# Patient Record
Sex: Male | Born: 1945 | Race: White | Hispanic: No | Smoking: Never smoker
Health system: Southern US, Community
[De-identification: ages and names within clinical notes are randomized; demographics above are authoritative.]

## PROBLEM LIST (undated history)

## (undated) ENCOUNTER — Emergency Department (HOSPITAL_COMMUNITY): Discharge status: Absent without leave | Payer: Self-pay

## (undated) DIAGNOSIS — N200 Calculus of kidney: Secondary | ICD-10-CM

## (undated) DIAGNOSIS — I469 Cardiac arrest, cause unspecified: Secondary | ICD-10-CM

## (undated) DIAGNOSIS — E1149 Type 2 diabetes mellitus with other diabetic neurological complication: Secondary | ICD-10-CM

## (undated) DIAGNOSIS — Z7901 Long term (current) use of anticoagulants: Secondary | ICD-10-CM

## (undated) DIAGNOSIS — I4819 Other persistent atrial fibrillation: Secondary | ICD-10-CM

## (undated) DIAGNOSIS — E119 Type 2 diabetes mellitus without complications: Secondary | ICD-10-CM

## (undated) DIAGNOSIS — Z95810 Presence of automatic (implantable) cardiac defibrillator: Secondary | ICD-10-CM

## (undated) DIAGNOSIS — I219 Acute myocardial infarction, unspecified: Secondary | ICD-10-CM

## (undated) DIAGNOSIS — E785 Hyperlipidemia, unspecified: Secondary | ICD-10-CM

## (undated) DIAGNOSIS — I255 Ischemic cardiomyopathy: Secondary | ICD-10-CM

## (undated) DIAGNOSIS — L84 Corns and callosities: Secondary | ICD-10-CM

## (undated) DIAGNOSIS — D485 Neoplasm of uncertain behavior of skin: Secondary | ICD-10-CM

## (undated) DIAGNOSIS — E291 Testicular hypofunction: Secondary | ICD-10-CM

## (undated) DIAGNOSIS — Z87442 Personal history of urinary calculi: Secondary | ICD-10-CM

## (undated) DIAGNOSIS — I5022 Chronic systolic (congestive) heart failure: Secondary | ICD-10-CM

## (undated) HISTORY — PX: CARDIAC DEFIBRILLATOR PLACEMENT: SHX171

## (undated) HISTORY — DX: Type 2 diabetes mellitus with other diabetic neurological complication: E11.49

## (undated) HISTORY — PX: PTCA: SHX146

## (undated) HISTORY — PX: CARDIAC CATHETERIZATION: SHX172

## (undated) HISTORY — DX: Neoplasm of uncertain behavior of skin: D48.5

## (undated) HISTORY — PX: TONSILLECTOMY: SUR1361

## (undated) HISTORY — DX: Testicular hypofunction: E29.1

## (undated) HISTORY — DX: Other persistent atrial fibrillation: I48.19

## (undated) HISTORY — PX: EYE SURGERY: SHX253

## (undated) HISTORY — DX: Chronic systolic (congestive) heart failure: I50.22

## (undated) HISTORY — DX: Corns and callosities: L84

## (undated) HISTORY — PX: CATARACT EXTRACTION: SUR2

## (undated) HISTORY — DX: Personal history of urinary calculi: Z87.442

## (undated) HISTORY — DX: Ischemic cardiomyopathy: I25.5

## (undated) HISTORY — DX: Presence of automatic (implantable) cardiac defibrillator: Z95.810

## (undated) HISTORY — DX: Type 2 diabetes mellitus without complications: E11.9

## (undated) HISTORY — DX: Long term (current) use of anticoagulants: Z79.01

## (undated) HISTORY — PX: TONSILLECTOMY: SHX5217

## (undated) HISTORY — DX: Hyperlipidemia, unspecified: E78.5

---

## 2000-11-21 LAB — HM COLONOSCOPY: HM Colonoscopy: NORMAL

## 2004-01-13 ENCOUNTER — Ambulatory Visit: Payer: Self-pay | Admitting: Internal Medicine

## 2004-02-29 DIAGNOSIS — I219 Acute myocardial infarction, unspecified: Secondary | ICD-10-CM

## 2004-02-29 HISTORY — DX: Acute myocardial infarction, unspecified: I21.9

## 2004-03-25 ENCOUNTER — Ambulatory Visit: Payer: Self-pay | Admitting: Internal Medicine

## 2004-10-08 ENCOUNTER — Ambulatory Visit: Payer: Self-pay | Admitting: Internal Medicine

## 2004-11-03 DIAGNOSIS — I2510 Atherosclerotic heart disease of native coronary artery without angina pectoris: Secondary | ICD-10-CM | POA: Insufficient documentation

## 2005-02-22 ENCOUNTER — Ambulatory Visit: Payer: Self-pay | Admitting: Internal Medicine

## 2005-03-01 ENCOUNTER — Ambulatory Visit: Payer: Self-pay | Admitting: *Deleted

## 2005-03-08 ENCOUNTER — Ambulatory Visit: Payer: Self-pay | Admitting: *Deleted

## 2005-03-15 ENCOUNTER — Ambulatory Visit: Payer: Self-pay | Admitting: Cardiology

## 2005-03-15 ENCOUNTER — Ambulatory Visit: Payer: Self-pay | Admitting: Internal Medicine

## 2005-03-22 ENCOUNTER — Ambulatory Visit: Payer: Self-pay | Admitting: Cardiology

## 2005-03-29 ENCOUNTER — Ambulatory Visit: Payer: Self-pay | Admitting: Cardiology

## 2005-04-12 ENCOUNTER — Ambulatory Visit: Payer: Self-pay | Admitting: Cardiology

## 2005-05-03 ENCOUNTER — Ambulatory Visit: Payer: Self-pay | Admitting: Cardiology

## 2005-05-31 ENCOUNTER — Ambulatory Visit: Payer: Self-pay | Admitting: Cardiology

## 2005-06-28 ENCOUNTER — Ambulatory Visit: Payer: Self-pay | Admitting: Cardiology

## 2005-07-26 ENCOUNTER — Ambulatory Visit: Payer: Self-pay | Admitting: Internal Medicine

## 2005-08-23 ENCOUNTER — Ambulatory Visit: Payer: Self-pay | Admitting: Cardiology

## 2005-09-20 ENCOUNTER — Ambulatory Visit: Payer: Self-pay | Admitting: Cardiology

## 2005-10-18 ENCOUNTER — Ambulatory Visit: Payer: Self-pay | Admitting: Internal Medicine

## 2005-11-15 ENCOUNTER — Ambulatory Visit: Payer: Self-pay | Admitting: Cardiology

## 2005-11-29 ENCOUNTER — Ambulatory Visit: Payer: Self-pay | Admitting: *Deleted

## 2005-12-13 ENCOUNTER — Ambulatory Visit: Payer: Self-pay | Admitting: Cardiovascular Disease

## 2005-12-21 ENCOUNTER — Ambulatory Visit: Payer: Self-pay | Admitting: Internal Medicine

## 2005-12-27 ENCOUNTER — Encounter: Payer: Self-pay | Admitting: Cardiology

## 2005-12-27 ENCOUNTER — Ambulatory Visit: Payer: Self-pay

## 2005-12-27 ENCOUNTER — Ambulatory Visit: Payer: Self-pay | Admitting: Cardiology

## 2006-01-04 ENCOUNTER — Ambulatory Visit: Payer: Self-pay | Admitting: Internal Medicine

## 2006-01-04 ENCOUNTER — Ambulatory Visit: Payer: Self-pay | Admitting: Cardiology

## 2006-03-15 ENCOUNTER — Ambulatory Visit: Payer: Self-pay | Admitting: Cardiology

## 2006-05-19 ENCOUNTER — Ambulatory Visit: Payer: Self-pay | Admitting: Internal Medicine

## 2006-06-29 ENCOUNTER — Ambulatory Visit: Payer: Self-pay | Admitting: Cardiovascular Disease

## 2006-06-29 ENCOUNTER — Ambulatory Visit: Payer: Self-pay

## 2006-06-29 ENCOUNTER — Ambulatory Visit: Payer: Self-pay | Admitting: Internal Medicine

## 2006-07-11 ENCOUNTER — Ambulatory Visit: Payer: Self-pay | Admitting: Cardiology

## 2006-08-01 ENCOUNTER — Ambulatory Visit: Payer: Self-pay | Admitting: Cardiology

## 2006-08-01 ENCOUNTER — Ambulatory Visit: Payer: Self-pay | Admitting: Cardiovascular Disease

## 2006-08-01 LAB — CONVERTED CEMR LAB
Albumin: 3.8 g/dL (ref 3.5–5.2)
Alkaline Phosphatase: 74 units/L (ref 39–117)
BUN: 12 mg/dL (ref 6–23)
CO2: 30 meq/L (ref 19–32)
GFR calc Af Amer: 88 mL/min
GFR calc non Af Amer: 73 mL/min
Glucose, Bld: 159 mg/dL — ABNORMAL HIGH (ref 70–99)
Sodium: 137 meq/L (ref 135–145)
Total CHOL/HDL Ratio: 4.3
Total Protein: 7.2 g/dL (ref 6.0–8.3)
Triglycerides: 339 mg/dL (ref 0–149)

## 2006-08-15 ENCOUNTER — Ambulatory Visit: Payer: Self-pay | Admitting: Internal Medicine

## 2006-09-11 ENCOUNTER — Ambulatory Visit: Payer: Self-pay | Admitting: Internal Medicine

## 2006-09-12 ENCOUNTER — Ambulatory Visit: Payer: Self-pay | Admitting: Cardiovascular Disease

## 2006-10-03 ENCOUNTER — Ambulatory Visit: Payer: Self-pay | Admitting: Cardiology

## 2006-11-13 ENCOUNTER — Ambulatory Visit: Payer: Self-pay | Admitting: Internal Medicine

## 2006-11-14 ENCOUNTER — Ambulatory Visit: Payer: Self-pay | Admitting: Internal Medicine

## 2006-11-24 ENCOUNTER — Encounter: Payer: Self-pay | Admitting: Internal Medicine

## 2006-11-24 ENCOUNTER — Ambulatory Visit: Payer: Self-pay | Admitting: Internal Medicine

## 2006-11-25 DIAGNOSIS — I4891 Unspecified atrial fibrillation: Secondary | ICD-10-CM | POA: Insufficient documentation

## 2006-11-25 DIAGNOSIS — E785 Hyperlipidemia, unspecified: Secondary | ICD-10-CM

## 2006-11-25 DIAGNOSIS — I4819 Other persistent atrial fibrillation: Secondary | ICD-10-CM

## 2006-11-25 DIAGNOSIS — I480 Paroxysmal atrial fibrillation: Secondary | ICD-10-CM | POA: Insufficient documentation

## 2006-11-25 DIAGNOSIS — I2589 Other forms of chronic ischemic heart disease: Secondary | ICD-10-CM | POA: Insufficient documentation

## 2006-11-25 HISTORY — DX: Hyperlipidemia, unspecified: E78.5

## 2006-11-25 HISTORY — DX: Other persistent atrial fibrillation: I48.19

## 2006-11-28 ENCOUNTER — Ambulatory Visit: Payer: Self-pay | Admitting: Cardiology

## 2006-11-30 ENCOUNTER — Ambulatory Visit: Payer: Self-pay | Admitting: Internal Medicine

## 2006-11-30 LAB — CONVERTED CEMR LAB
AST: 24 units/L (ref 0–37)
Albumin: 3.8 g/dL (ref 3.5–5.2)
Alkaline Phosphatase: 77 units/L (ref 39–117)
BUN: 16 mg/dL (ref 6–23)
CO2: 31 meq/L (ref 19–32)
Calcium: 9.4 mg/dL (ref 8.4–10.5)
Chloride: 102 meq/L (ref 96–112)
Creatinine, Ser: 1.2 mg/dL (ref 0.4–1.5)
HCT: 40.8 % (ref 39.0–52.0)
Hgb A1c MFr Bld: 8.4 % — ABNORMAL HIGH (ref 4.6–6.0)
Lymphocytes Relative: 22 % (ref 12.0–46.0)
MCHC: 35 g/dL (ref 30.0–36.0)
MCV: 88.9 fL (ref 78.0–100.0)
Potassium: 4.5 meq/L (ref 3.5–5.1)
TSH: 0.86 microintl units/mL (ref 0.35–5.50)
Testosterone: 155.93 ng/dL — ABNORMAL LOW (ref 350.00–890)
Vitamin B-12: 579 pg/mL (ref 211–911)
WBC: 7.4 10*3/uL (ref 4.5–10.5)

## 2006-12-05 ENCOUNTER — Encounter: Payer: Self-pay | Admitting: Internal Medicine

## 2006-12-05 ENCOUNTER — Ambulatory Visit: Payer: Self-pay | Admitting: Internal Medicine

## 2006-12-05 DIAGNOSIS — E291 Testicular hypofunction: Secondary | ICD-10-CM | POA: Insufficient documentation

## 2006-12-05 DIAGNOSIS — E1165 Type 2 diabetes mellitus with hyperglycemia: Secondary | ICD-10-CM | POA: Insufficient documentation

## 2006-12-05 DIAGNOSIS — E119 Type 2 diabetes mellitus without complications: Secondary | ICD-10-CM

## 2006-12-05 HISTORY — DX: Testicular hypofunction: E29.1

## 2006-12-05 HISTORY — DX: Type 2 diabetes mellitus without complications: E11.9

## 2006-12-07 ENCOUNTER — Ambulatory Visit: Payer: Self-pay | Admitting: Internal Medicine

## 2006-12-12 ENCOUNTER — Ambulatory Visit: Payer: Self-pay | Admitting: Internal Medicine

## 2006-12-26 ENCOUNTER — Ambulatory Visit: Payer: Self-pay | Admitting: Cardiology

## 2007-01-08 ENCOUNTER — Ambulatory Visit: Payer: Self-pay | Admitting: Cardiovascular Disease

## 2007-01-10 ENCOUNTER — Ambulatory Visit: Payer: Self-pay

## 2007-01-23 ENCOUNTER — Ambulatory Visit: Payer: Self-pay | Admitting: Cardiovascular Disease

## 2007-01-28 ENCOUNTER — Encounter: Payer: Self-pay | Admitting: Internal Medicine

## 2007-02-20 ENCOUNTER — Ambulatory Visit: Payer: Self-pay | Admitting: Cardiology

## 2007-03-08 ENCOUNTER — Ambulatory Visit: Payer: Self-pay | Admitting: Internal Medicine

## 2007-03-22 ENCOUNTER — Ambulatory Visit: Payer: Self-pay | Admitting: Cardiology

## 2007-04-16 ENCOUNTER — Telehealth: Payer: Self-pay | Admitting: Internal Medicine

## 2007-04-19 ENCOUNTER — Ambulatory Visit: Payer: Self-pay | Admitting: Cardiology

## 2007-05-15 ENCOUNTER — Ambulatory Visit: Payer: Self-pay | Admitting: Cardiology

## 2007-06-05 ENCOUNTER — Telehealth: Payer: Self-pay | Admitting: Internal Medicine

## 2007-06-05 DIAGNOSIS — D485 Neoplasm of uncertain behavior of skin: Secondary | ICD-10-CM

## 2007-06-05 HISTORY — DX: Neoplasm of uncertain behavior of skin: D48.5

## 2007-06-07 ENCOUNTER — Ambulatory Visit: Payer: Self-pay | Admitting: Internal Medicine

## 2007-06-12 ENCOUNTER — Ambulatory Visit: Payer: Self-pay | Admitting: Cardiology

## 2007-07-10 ENCOUNTER — Ambulatory Visit: Payer: Self-pay | Admitting: Cardiovascular Disease

## 2007-08-07 ENCOUNTER — Ambulatory Visit: Payer: Self-pay | Admitting: Cardiovascular Disease

## 2007-08-14 ENCOUNTER — Ambulatory Visit: Payer: Self-pay | Admitting: Cardiovascular Disease

## 2007-08-14 LAB — CONVERTED CEMR LAB
Albumin: 3.9 g/dL (ref 3.5–5.2)
Alkaline Phosphatase: 61 units/L (ref 39–117)
Calcium: 9.3 mg/dL (ref 8.4–10.5)
Cholesterol: 112 mg/dL (ref 0–200)
Creatinine, Ser: 1.2 mg/dL (ref 0.4–1.5)
Potassium: 4.5 meq/L (ref 3.5–5.1)
Total CHOL/HDL Ratio: 4.2

## 2007-08-21 ENCOUNTER — Ambulatory Visit: Payer: Self-pay | Admitting: Cardiovascular Disease

## 2007-08-22 ENCOUNTER — Telehealth (INDEPENDENT_AMBULATORY_CARE_PROVIDER_SITE_OTHER): Payer: Self-pay | Admitting: *Deleted

## 2007-08-22 ENCOUNTER — Telehealth: Payer: Self-pay | Admitting: Internal Medicine

## 2007-08-30 ENCOUNTER — Ambulatory Visit: Payer: Self-pay | Admitting: Cardiology

## 2007-09-05 ENCOUNTER — Ambulatory Visit: Payer: Self-pay | Admitting: Internal Medicine

## 2007-09-24 ENCOUNTER — Telehealth: Payer: Self-pay | Admitting: Internal Medicine

## 2007-09-25 ENCOUNTER — Ambulatory Visit: Payer: Self-pay | Admitting: Cardiology

## 2007-10-23 ENCOUNTER — Ambulatory Visit: Payer: Self-pay | Admitting: Internal Medicine

## 2007-10-23 ENCOUNTER — Ambulatory Visit: Payer: Self-pay | Admitting: Cardiology

## 2007-11-06 ENCOUNTER — Ambulatory Visit: Payer: Self-pay | Admitting: Internal Medicine

## 2007-11-20 ENCOUNTER — Ambulatory Visit: Payer: Self-pay | Admitting: Cardiology

## 2007-11-26 ENCOUNTER — Ambulatory Visit: Payer: Self-pay | Admitting: Internal Medicine

## 2007-11-26 ENCOUNTER — Telehealth: Payer: Self-pay | Admitting: Internal Medicine

## 2007-12-18 ENCOUNTER — Ambulatory Visit: Payer: Self-pay | Admitting: Radiology

## 2008-01-08 ENCOUNTER — Ambulatory Visit: Payer: Self-pay | Admitting: Cardiovascular Disease

## 2008-01-15 ENCOUNTER — Ambulatory Visit: Payer: Self-pay | Admitting: Cardiology

## 2008-02-07 ENCOUNTER — Ambulatory Visit: Payer: Self-pay | Admitting: Internal Medicine

## 2008-02-12 ENCOUNTER — Ambulatory Visit: Payer: Self-pay | Admitting: Internal Medicine

## 2008-02-23 ENCOUNTER — Emergency Department (HOSPITAL_COMMUNITY): Admission: EM | Admit: 2008-02-23 | Discharge: 2008-02-23 | Payer: Self-pay | Admitting: Emergency Medicine

## 2008-02-25 ENCOUNTER — Encounter: Payer: Self-pay | Admitting: Internal Medicine

## 2008-02-25 ENCOUNTER — Telehealth: Payer: Self-pay | Admitting: Internal Medicine

## 2008-02-25 DIAGNOSIS — Z87442 Personal history of urinary calculi: Secondary | ICD-10-CM

## 2008-02-25 HISTORY — DX: Personal history of urinary calculi: Z87.442

## 2008-02-26 ENCOUNTER — Ambulatory Visit: Payer: Self-pay | Admitting: Internal Medicine

## 2008-02-28 ENCOUNTER — Telehealth: Payer: Self-pay | Admitting: Internal Medicine

## 2008-02-28 ENCOUNTER — Ambulatory Visit: Payer: Self-pay | Admitting: Internal Medicine

## 2008-03-03 ENCOUNTER — Encounter: Payer: Self-pay | Admitting: Internal Medicine

## 2008-03-11 ENCOUNTER — Ambulatory Visit: Payer: Self-pay | Admitting: Internal Medicine

## 2008-04-08 ENCOUNTER — Encounter: Payer: Self-pay | Admitting: Internal Medicine

## 2008-04-08 ENCOUNTER — Ambulatory Visit: Payer: Self-pay | Admitting: Internal Medicine

## 2008-04-22 ENCOUNTER — Ambulatory Visit: Payer: Self-pay | Admitting: Cardiovascular Disease

## 2008-05-06 ENCOUNTER — Ambulatory Visit: Payer: Self-pay

## 2008-05-06 ENCOUNTER — Ambulatory Visit: Payer: Self-pay | Admitting: Cardiology

## 2008-05-28 ENCOUNTER — Ambulatory Visit: Payer: Self-pay | Admitting: Cardiology

## 2008-06-03 ENCOUNTER — Telehealth: Payer: Self-pay | Admitting: Internal Medicine

## 2008-06-17 ENCOUNTER — Ambulatory Visit: Payer: Self-pay | Admitting: Cardiology

## 2008-06-18 ENCOUNTER — Telehealth: Payer: Self-pay | Admitting: Internal Medicine

## 2008-07-10 ENCOUNTER — Ambulatory Visit: Payer: Self-pay | Admitting: Internal Medicine

## 2008-07-15 ENCOUNTER — Ambulatory Visit: Payer: Self-pay | Admitting: Internal Medicine

## 2008-07-18 ENCOUNTER — Encounter: Payer: Self-pay | Admitting: Internal Medicine

## 2008-07-29 ENCOUNTER — Encounter: Payer: Self-pay | Admitting: *Deleted

## 2008-08-12 ENCOUNTER — Ambulatory Visit: Payer: Self-pay | Admitting: Internal Medicine

## 2008-08-12 ENCOUNTER — Encounter (INDEPENDENT_AMBULATORY_CARE_PROVIDER_SITE_OTHER): Payer: Self-pay | Admitting: Cardiology

## 2008-08-12 LAB — CONVERTED CEMR LAB: POC INR: 2.7

## 2008-09-03 ENCOUNTER — Encounter: Payer: Self-pay | Admitting: *Deleted

## 2008-09-09 ENCOUNTER — Encounter (INDEPENDENT_AMBULATORY_CARE_PROVIDER_SITE_OTHER): Payer: Self-pay | Admitting: Cardiology

## 2008-09-09 ENCOUNTER — Ambulatory Visit: Payer: Self-pay | Admitting: Cardiology

## 2008-09-09 LAB — CONVERTED CEMR LAB
POC INR: 3.4
Prothrombin Time: 22.2 s

## 2008-09-22 DIAGNOSIS — I255 Ischemic cardiomyopathy: Secondary | ICD-10-CM

## 2008-09-22 DIAGNOSIS — Z95810 Presence of automatic (implantable) cardiac defibrillator: Secondary | ICD-10-CM

## 2008-09-22 DIAGNOSIS — Z9849 Cataract extraction status, unspecified eye: Secondary | ICD-10-CM | POA: Insufficient documentation

## 2008-09-22 HISTORY — DX: Presence of automatic (implantable) cardiac defibrillator: Z95.810

## 2008-09-22 HISTORY — DX: Ischemic cardiomyopathy: I25.5

## 2008-10-02 ENCOUNTER — Ambulatory Visit: Payer: Self-pay | Admitting: Cardiology

## 2008-10-02 LAB — CONVERTED CEMR LAB: POC INR: 2.3

## 2008-10-10 ENCOUNTER — Encounter (INDEPENDENT_AMBULATORY_CARE_PROVIDER_SITE_OTHER): Payer: Self-pay | Admitting: *Deleted

## 2008-10-21 ENCOUNTER — Encounter (INDEPENDENT_AMBULATORY_CARE_PROVIDER_SITE_OTHER): Payer: Self-pay | Admitting: Cardiology

## 2008-10-21 ENCOUNTER — Ambulatory Visit: Payer: Self-pay | Admitting: Cardiovascular Disease

## 2008-10-21 LAB — CONVERTED CEMR LAB: POC INR: 2

## 2008-11-11 ENCOUNTER — Ambulatory Visit: Payer: Self-pay | Admitting: Internal Medicine

## 2008-11-11 ENCOUNTER — Ambulatory Visit: Payer: Self-pay | Admitting: Cardiology

## 2008-11-11 ENCOUNTER — Encounter (INDEPENDENT_AMBULATORY_CARE_PROVIDER_SITE_OTHER): Payer: Self-pay | Admitting: *Deleted

## 2008-12-02 ENCOUNTER — Ambulatory Visit (HOSPITAL_COMMUNITY): Admission: RE | Admit: 2008-12-02 | Discharge: 2008-12-02 | Payer: Self-pay | Admitting: Cardiology

## 2008-12-02 ENCOUNTER — Ambulatory Visit: Payer: Self-pay | Admitting: Cardiology

## 2008-12-02 ENCOUNTER — Encounter: Payer: Self-pay | Admitting: Internal Medicine

## 2008-12-02 LAB — CONVERTED CEMR LAB: POC INR: 1.9

## 2008-12-12 ENCOUNTER — Telehealth (INDEPENDENT_AMBULATORY_CARE_PROVIDER_SITE_OTHER): Payer: Self-pay | Admitting: *Deleted

## 2008-12-15 ENCOUNTER — Telehealth (INDEPENDENT_AMBULATORY_CARE_PROVIDER_SITE_OTHER): Payer: Self-pay | Admitting: *Deleted

## 2008-12-30 ENCOUNTER — Ambulatory Visit: Payer: Self-pay | Admitting: Internal Medicine

## 2009-01-12 ENCOUNTER — Ambulatory Visit: Payer: Self-pay | Admitting: Internal Medicine

## 2009-01-12 LAB — CONVERTED CEMR LAB
ALT: 23 units/L (ref 0–53)
AST: 18 units/L (ref 0–37)
Albumin: 3.8 g/dL (ref 3.5–5.2)
Alkaline Phosphatase: 68 units/L (ref 39–117)
Bilirubin, Direct: 0.2 mg/dL (ref 0.0–0.3)
CO2: 31 meq/L (ref 19–32)
Calcium: 9.1 mg/dL (ref 8.4–10.5)
Glucose, Bld: 163 mg/dL — ABNORMAL HIGH (ref 70–99)
Leukocytes, UA: NEGATIVE
Lymphocytes Relative: 17.6 % (ref 12.0–46.0)
MCHC: 34.4 g/dL (ref 30.0–36.0)
Monocytes Relative: 14.8 % — ABNORMAL HIGH (ref 3.0–12.0)
Neutrophils Relative %: 63 % (ref 43.0–77.0)
PSA: 0.39 ng/mL (ref 0.10–4.00)
Platelets: 129 10*3/uL — ABNORMAL LOW (ref 150.0–400.0)
Potassium: 4.4 meq/L (ref 3.5–5.1)
RDW: 12 % (ref 11.5–14.6)
Sodium: 136 meq/L (ref 135–145)
Total Bilirubin: 1 mg/dL (ref 0.3–1.2)
Total Protein: 6.9 g/dL (ref 6.0–8.3)
Triglycerides: 284 mg/dL — ABNORMAL HIGH (ref 0.0–149.0)
Urobilinogen, UA: 0.2 (ref 0.0–1.0)
VLDL: 56.8 mg/dL — ABNORMAL HIGH (ref 0.0–40.0)

## 2009-01-20 ENCOUNTER — Ambulatory Visit: Payer: Self-pay | Admitting: Internal Medicine

## 2009-01-20 LAB — CONVERTED CEMR LAB
Ferritin: 60.2 ng/mL (ref 22.0–322.0)
Hgb A1c MFr Bld: 7.9 % — ABNORMAL HIGH (ref 4.6–6.5)

## 2009-01-27 ENCOUNTER — Ambulatory Visit: Payer: Self-pay | Admitting: Cardiovascular Disease

## 2009-02-12 ENCOUNTER — Ambulatory Visit: Payer: Self-pay | Admitting: Internal Medicine

## 2009-02-24 ENCOUNTER — Ambulatory Visit: Payer: Self-pay | Admitting: Cardiovascular Disease

## 2009-02-24 ENCOUNTER — Encounter (INDEPENDENT_AMBULATORY_CARE_PROVIDER_SITE_OTHER): Payer: Self-pay | Admitting: *Deleted

## 2009-03-02 ENCOUNTER — Ambulatory Visit: Payer: Self-pay | Admitting: Internal Medicine

## 2009-03-02 ENCOUNTER — Telehealth (INDEPENDENT_AMBULATORY_CARE_PROVIDER_SITE_OTHER): Payer: Self-pay | Admitting: *Deleted

## 2009-03-23 ENCOUNTER — Telehealth: Payer: Self-pay | Admitting: Internal Medicine

## 2009-03-24 ENCOUNTER — Encounter (INDEPENDENT_AMBULATORY_CARE_PROVIDER_SITE_OTHER): Payer: Self-pay | Admitting: *Deleted

## 2009-03-24 ENCOUNTER — Ambulatory Visit: Payer: Self-pay | Admitting: Cardiovascular Disease

## 2009-04-09 ENCOUNTER — Encounter: Payer: Self-pay | Admitting: Internal Medicine

## 2009-04-21 ENCOUNTER — Ambulatory Visit: Payer: Self-pay | Admitting: Cardiology

## 2009-04-21 ENCOUNTER — Ambulatory Visit: Payer: Self-pay | Admitting: Internal Medicine

## 2009-04-21 LAB — CONVERTED CEMR LAB
Basophils Absolute: 0.1 10*3/uL (ref 0.0–0.1)
CO2: 29 meq/L (ref 19–32)
Eosinophils Relative: 4 % (ref 0.0–5.0)
GFR calc non Af Amer: 65.31 mL/min (ref >60)
Glucose, Bld: 127 mg/dL — ABNORMAL HIGH (ref 70–99)
HCT: 50.9 % (ref 39.0–52.0)
Hemoglobin: 17.1 g/dL — ABNORMAL HIGH (ref 13.0–17.0)
MCHC: 33.5 g/dL (ref 30.0–36.0)
Neutro Abs: 4 10*3/uL (ref 1.4–7.7)
POC INR: 2.7
Platelets: 153 10*3/uL (ref 150.0–400.0)
Potassium: 5.5 meq/L — ABNORMAL HIGH (ref 3.5–5.1)
RDW: 12.9 % (ref 11.5–14.6)
Sodium: 140 meq/L (ref 135–145)
WBC: 7.1 10*3/uL (ref 4.5–10.5)
aPTT: 43.2 s — ABNORMAL HIGH (ref 21.7–28.8)

## 2009-04-22 ENCOUNTER — Telehealth (INDEPENDENT_AMBULATORY_CARE_PROVIDER_SITE_OTHER): Payer: Self-pay | Admitting: *Deleted

## 2009-04-23 ENCOUNTER — Telehealth: Payer: Self-pay | Admitting: Internal Medicine

## 2009-04-23 ENCOUNTER — Encounter (HOSPITAL_COMMUNITY): Admission: RE | Admit: 2009-04-23 | Discharge: 2009-06-30 | Payer: Self-pay | Admitting: Internal Medicine

## 2009-04-23 ENCOUNTER — Ambulatory Visit: Payer: Self-pay

## 2009-04-23 ENCOUNTER — Encounter: Payer: Self-pay | Admitting: Internal Medicine

## 2009-04-27 ENCOUNTER — Ambulatory Visit: Payer: Self-pay | Admitting: Internal Medicine

## 2009-04-27 ENCOUNTER — Ambulatory Visit (HOSPITAL_COMMUNITY): Admission: RE | Admit: 2009-04-27 | Discharge: 2009-04-27 | Payer: Self-pay | Admitting: Internal Medicine

## 2009-04-28 ENCOUNTER — Encounter: Payer: Self-pay | Admitting: Internal Medicine

## 2009-04-28 ENCOUNTER — Telehealth: Payer: Self-pay | Admitting: Internal Medicine

## 2009-04-29 ENCOUNTER — Encounter: Payer: Self-pay | Admitting: Internal Medicine

## 2009-05-11 ENCOUNTER — Ambulatory Visit: Payer: Self-pay | Admitting: Internal Medicine

## 2009-05-11 ENCOUNTER — Ambulatory Visit: Payer: Self-pay

## 2009-05-11 ENCOUNTER — Encounter: Payer: Self-pay | Admitting: Internal Medicine

## 2009-05-11 LAB — CONVERTED CEMR LAB: POC INR: 2.3

## 2009-05-12 ENCOUNTER — Encounter: Payer: Self-pay | Admitting: Internal Medicine

## 2009-05-13 ENCOUNTER — Ambulatory Visit: Payer: Self-pay | Admitting: Internal Medicine

## 2009-05-13 LAB — CONVERTED CEMR LAB: POC INR: 2

## 2009-06-04 ENCOUNTER — Telehealth: Payer: Self-pay | Admitting: Internal Medicine

## 2009-06-05 ENCOUNTER — Ambulatory Visit: Payer: Self-pay | Admitting: Internal Medicine

## 2009-06-12 ENCOUNTER — Telehealth: Payer: Self-pay | Admitting: Internal Medicine

## 2009-06-23 ENCOUNTER — Telehealth: Payer: Self-pay | Admitting: Internal Medicine

## 2009-07-07 ENCOUNTER — Ambulatory Visit: Payer: Self-pay | Admitting: Cardiology

## 2009-08-04 ENCOUNTER — Ambulatory Visit: Payer: Self-pay | Admitting: Cardiology

## 2009-08-04 LAB — CONVERTED CEMR LAB: POC INR: 2.5

## 2009-08-12 ENCOUNTER — Telehealth: Payer: Self-pay | Admitting: Internal Medicine

## 2009-09-01 ENCOUNTER — Ambulatory Visit: Payer: Self-pay | Admitting: Internal Medicine

## 2009-09-29 ENCOUNTER — Ambulatory Visit: Payer: Self-pay | Admitting: Cardiology

## 2009-10-07 ENCOUNTER — Telehealth: Payer: Self-pay | Admitting: Internal Medicine

## 2009-10-20 ENCOUNTER — Telehealth: Payer: Self-pay | Admitting: Internal Medicine

## 2009-11-03 ENCOUNTER — Ambulatory Visit: Payer: Self-pay | Admitting: Internal Medicine

## 2009-11-03 ENCOUNTER — Ambulatory Visit: Payer: Self-pay | Admitting: Cardiovascular Disease

## 2009-11-03 DIAGNOSIS — I5022 Chronic systolic (congestive) heart failure: Secondary | ICD-10-CM | POA: Insufficient documentation

## 2009-11-03 HISTORY — DX: Chronic systolic (congestive) heart failure: I50.22

## 2009-12-01 ENCOUNTER — Encounter: Payer: Self-pay | Admitting: Internal Medicine

## 2009-12-01 ENCOUNTER — Ambulatory Visit: Payer: Self-pay

## 2009-12-01 ENCOUNTER — Ambulatory Visit: Payer: Self-pay | Admitting: Cardiology

## 2009-12-01 ENCOUNTER — Ambulatory Visit (HOSPITAL_COMMUNITY): Admission: RE | Admit: 2009-12-01 | Discharge: 2009-12-01 | Payer: Self-pay | Admitting: Internal Medicine

## 2009-12-07 ENCOUNTER — Telehealth: Payer: Self-pay | Admitting: Internal Medicine

## 2009-12-08 ENCOUNTER — Ambulatory Visit: Payer: Self-pay | Admitting: Internal Medicine

## 2009-12-08 DIAGNOSIS — E1149 Type 2 diabetes mellitus with other diabetic neurological complication: Secondary | ICD-10-CM | POA: Insufficient documentation

## 2009-12-08 DIAGNOSIS — L84 Corns and callosities: Secondary | ICD-10-CM

## 2009-12-08 HISTORY — DX: Type 2 diabetes mellitus with other diabetic neurological complication: E11.49

## 2009-12-08 HISTORY — DX: Corns and callosities: L84

## 2009-12-14 ENCOUNTER — Telehealth: Payer: Self-pay | Admitting: Internal Medicine

## 2009-12-15 ENCOUNTER — Ambulatory Visit: Payer: Self-pay | Admitting: Internal Medicine

## 2009-12-15 LAB — CONVERTED CEMR LAB: Hgb A1c MFr Bld: 7.5 % — ABNORMAL HIGH (ref 4.6–6.5)

## 2009-12-21 ENCOUNTER — Telehealth: Payer: Self-pay | Admitting: Internal Medicine

## 2009-12-23 ENCOUNTER — Encounter: Payer: Self-pay | Admitting: Internal Medicine

## 2009-12-29 ENCOUNTER — Ambulatory Visit: Payer: Self-pay | Admitting: Cardiovascular Disease

## 2010-01-26 ENCOUNTER — Ambulatory Visit: Payer: Self-pay | Admitting: Cardiology

## 2010-01-26 LAB — CONVERTED CEMR LAB: POC INR: 2.2

## 2010-02-04 ENCOUNTER — Ambulatory Visit: Payer: Self-pay | Admitting: Internal Medicine

## 2010-02-23 ENCOUNTER — Ambulatory Visit: Payer: Self-pay | Admitting: Internal Medicine

## 2010-02-23 LAB — CONVERTED CEMR LAB: POC INR: 1.8

## 2010-03-09 ENCOUNTER — Encounter (INDEPENDENT_AMBULATORY_CARE_PROVIDER_SITE_OTHER): Payer: Self-pay | Admitting: *Deleted

## 2010-03-23 ENCOUNTER — Ambulatory Visit: Admission: RE | Admit: 2010-03-23 | Discharge: 2010-03-23 | Payer: Self-pay | Source: Home / Self Care

## 2010-03-23 LAB — CONVERTED CEMR LAB: POC INR: 1.6

## 2010-03-26 ENCOUNTER — Ambulatory Visit: Admit: 2010-03-26 | Payer: Self-pay | Admitting: Internal Medicine

## 2010-03-30 NOTE — Cardiovascular Report (Signed)
Summary: Implant Record ID Card  Implant Record ID Card   Imported By: Roderic Ovens 06/03/2009 10:06:35  _____________________________________________________________________  External Attachment:    Type:   Image     Comment:   External Document

## 2010-03-30 NOTE — Miscellaneous (Signed)
Summary: update med  Clinical Lists Changes  Medications: Added new medication of NITROGLYCERIN 0.4 MG SUBL (NITROGLYCERIN) One tablet under tongue every 5 minutes as needed for chest pain---may repeat times three

## 2010-03-30 NOTE — Progress Notes (Signed)
Summary: metformin  Phone Note Call from Patient Call back at Home Phone [phone]   Summary of Call: Patient left message on triage that a friend of his puts salve on his shoulders and it seems to be working well. Patient would like to know if possibly using the salve on the shoulders would be ok for him instead of taking Metformin. Please advise. Initial call taken by: Lucious Groves,  June 04, 2009 8:15 AM  Follow-up for Phone Call        sorry, no , since the salve must be for a skin problem, and metformin is for diabetes Follow-up by: Corwin Levins MD,  June 04, 2009 12:19 PM  Additional Follow-up for Phone Call Additional follow up Details #1::        pt says that he would like to use salve every other day in place of the testosterone. pt is requesting a call from MEN when MD returns. Pt is aware that MD returns Monday afternoon Additional Follow-up by: Margaret Pyle, CMA,  June 04, 2009 2:19 PM    Additional Follow-up for Phone Call Additional follow up Details #2::    called patient - he wants to try androderm patch 5mg  applied q AM-called in Follow-up by: Jacques Navy MD,  June 10, 2009 3:04 PM  New/Updated Medications: ANDRODERM 5 MG/24HR PT24 (TESTOSTERONE) apply patch q AM Prescriptions: ANDRODERM 5 MG/24HR PT24 (TESTOSTERONE) apply patch q AM  #30 x 12   Entered and Authorized by:   Jacques Navy MD   Signed by:   Jacques Navy MD on 06/10/2009   Method used:   Telephoned to ...       [facility])       [address] [place], [place]  [address]       Ph: [phone] or [phone]       Fax: [phone]   RxID:   [id number]

## 2010-03-30 NOTE — Medication Information (Signed)
Summary: rov/ewj  Anticoagulant Therapy  Managed by: Bethena Midget, RN, BSN Referring MD: Tonny Bollman, MD PCP: Link Snuffer MD: Tenny Craw MD, Gunnar Fusi Indication 1: Atrial Fibrillation (ICD-427.31) Lab Used: [facility] Site: [facility] INR POC 2.3 INR RANGE 2 - 3  Dietary changes: no    Health status changes: no    Bleeding/hemorrhagic complications: no    Recent/future hospitalizations: no    Any changes in medication regimen? no    Recent/future dental: no  Any missed doses?: no       Is patient compliant with meds? yes      Comments: Pt being seen today by Pacer clinic due to swelling at device site which was implanted 2 weeks ago. Pacer clinic nurse states that there was some oozing at site, no redness noted. Thus, pt. returning on Wednesday for another check with MD. We will adjust dose down to 1/4 tab from 1/2 daily until Wed.   Allergies: 1)  ! [name]  Anticoagulation Management History:      The patient is taking warfarin and comes in today for a routine follow up visit.  Positive risk factors for bleeding include presence of serious comorbidities.  Negative risk factors for bleeding include an age less than 68 years old.  The bleeding index is 'intermediate risk'.  Positive CHADS2 values include History of Diabetes.  Negative CHADS2 values include Age > 12 years old.  The start date was 02/22/2005.  His last INR was 2.8 ratio.  Anticoagulation responsible provider: Tenny Craw MD, Gunnar Fusi.  INR POC: 2.3.  Cuvette Lot#: [id number].  Exp: 06/2010.    Anticoagulation Management Assessment/Plan:      The patient's current anticoagulation dose is Warfarin sodium 5 mg  tabs: as directed.  The target INR is 2 - 3.  The next INR is due 05/13/2009.  Anticoagulation instructions were given to patient.  Results were reviewed/authorized by Bethena Midget, RN, BSN.  He was notified by Bethena Midget, RN, BSN.         Prior Anticoagulation Instructions: INR 2.7  Continue current dosing  schedule.  Take 1/2 tablet every day, except take 1/4 tablet on Thursday.  Return to clinic in 4 weeks.   Current Anticoagulation Instructions: INR 2.3 Take 1.25mg s daily unitl device recheck on Wednesday.

## 2010-03-30 NOTE — Medication Information (Signed)
Summary: rov   js  Anticoagulant Therapy  Managed by: Leota Sauers, PharmD, BCPS, CPP Referring MD: Tonny Bollman, MD PCP: Link Snuffer MD: Excell Seltzer MD, Casimiro Needle Indication 1: Atrial Fibrillation (ICD-427.31) Lab Used: [facility] Site: [facility] INR POC 2.1 INR RANGE 2 - 3  Dietary changes: no    Health status changes: no    Bleeding/hemorrhagic complications: no    Recent/future hospitalizations: no    Any changes in medication regimen? no    Recent/future dental: no  Any missed doses?: no       Is patient compliant with meds? yes      Comments: problem with big  toe - directed him to primary MD  Current Medications (verified): 1)  Simvastatin 80 Mg  Tabs (Simvastatin) .... Q Pm 2)  Lisinopril 5 Mg  Tabs (Lisinopril) .... Once Daily 3)  Zolpidem Tartrate 10 Mg  Tabs (Zolpidem Tartrate) .... At Bedtime 4)  Aspirin Ec Low Dose 81 Mg  Tbec (Aspirin) .... Once Daily 5)  Multivitamins   Tabs (Multiple Vitamin) .... Once Daily 6)  Warfarin Sodium 5 Mg  Tabs (Warfarin Sodium) .... As Directed 7)  Vitamin B-12 500 Mcg  Tabs (Cyanocobalamin) .... Once Daily 8)  Desoximetasone 0.05 %  Crea (Desoximetasone) .... Apply Two Times A Day As Needed 9)  Viagra 100 Mg  Tabs (Sildenafil Citrate) .... Take 1/2-1 By Mouth As Directed 10)  Metformin Hcl 1000 Mg Tabs (Metformin Hcl) .Marland Kitchen.. 1 By Mouth Two Times A Day For Diabetes 11)  Testosterone Cypionate 200 Mg/ml  Oil (Testosterone Cypionate) .... 2 Cc Im Monthly 12)  Flomax 0.4 Mg Xr24h-Cap (Tamsulosin Hcl) .... Take 1 Tablet By Mouth Once A Day 13)  Nitroglycerin 0.4 Mg Subl (Nitroglycerin) .... One Tablet Under Tongue Every 5 Minutes As Needed For Chest Pain---May Repeat Times Three 14)  Metoprolol Succinate 100 Mg Xr24h-Tab (Metoprolol Succinate) .... Take Two Tablets By Mouth Once Daily  Allergies (verified): 1)  ! [name]  Anticoagulation Management History:      The patient is taking warfarin and comes in today for a  routine follow up visit.  Positive risk factors for bleeding include presence of serious comorbidities.  Negative risk factors for bleeding include an age less than 66 years old.  The bleeding index is 'intermediate risk'.  Positive CHADS2 values include History of Diabetes.  Negative CHADS2 values include Age > 40 years old.  The start date was 02/22/2005.  Anticoagulation responsible provider: Excell Seltzer MD, Casimiro Needle.  INR POC: 2.1.  Cuvette Lot#: [id number].  Exp: 03/2010.    Anticoagulation Management Assessment/Plan:      The patient's current anticoagulation dose is Warfarin sodium 5 mg  tabs: as directed.  The target INR is 2 - 3.  The next INR is due 04/21/2009.  Anticoagulation instructions were given to patient.  Results were reviewed/authorized by Leota Sauers, PharmD, BCPS, CPP.         Prior Anticoagulation Instructions: INR = 2.0  The patient is to continue with the same dose of coumadin.  This dosage includes:   1/2 tablet every day except 1/4 tablet on Thursdays.  Current Anticoagulation Instructions: INR 2.1  Continue 1/2 tab = 2.5mg  each day except 1/4 tab = 1.25mg  on Thur

## 2010-03-30 NOTE — Assessment & Plan Note (Signed)
Summary: [name]      Allergies Added:   Primary [name]:  Norins  CC:  device check.  Poor circulation and no energy.  Poor memory and pain in chest on left and right side intermittently.  INR 2.6 today.  History of Present Illness: [name] is seen in followup for aborted cardiac arrest for which he is status post ICD implantation. He is status post PCI with [name] depression of LV function. His most recent ECHO cardiogram in October 2010 demonstrated    1. Left ventricle: The cavity size was normal. Wall thickness was        normal. Systolic function was moderately to severely reduced. The        estimated ejection fraction was in the range of 30% to 35%.        Diffuse hypokinesis. 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  A Myoview scan in 2008 demonstrated inferior posterior wall MI but no ischemia.  He has permanent atrial fibrillation.     he comes in today with a number of complaints. First and foremost his exercise intolerance and fatigue. This is unaccompanied by edema. Further there is no exertional chest discomfort. He does have an intermittent chest pressure which is not related to activity.  He is on high-dose beta blockers specifically Toprol for his rate control.  He also complains of pain in his legs and his feet. He is notably on a statin. He also has diabetes raising a possibility of neuropathy.     Current Medications (verified): 1)  Simvastatin 80 Mg  Tabs (Simvastatin) .... Q Pm 2)  Lisinopril 5 Mg  Tabs (Lisinopril) .... Once Daily 3)  Zolpidem Tartrate 10 Mg  Tabs (Zolpidem Tartrate) .... At Bedtime 4)  Aspirin Ec Low Dose 81 Mg  Tbec (Aspirin) .... Once Daily 5)  Multivitamins   Tabs (Multiple Vitamin) .... Once Daily 6)  Warfarin Sodium 5 Mg  Tabs (Warfarin Sodium) .... As Directed 7)  Vitamin B-12 500 Mcg  Tabs (Cyanocobalamin) .... Once Daily 8)  Desoximetasone 0.05 %  Crea (Desoximetasone) .... Apply Two Times  A Day As Needed 9)  Viagra 100 Mg  Tabs (Sildenafil Citrate) .... Take 1/2-1 By Mouth As Directed 10)  Metformin Hcl 1000 Mg Tabs (Metformin Hcl) .Marland Kitchen.. 1 By Mouth Two Times A Day For Diabetes 11)  Androderm 5 Mg/24hr Pt24 (Testosterone) .... Apply Patch Q Am 12)  Flomax 0.4 Mg Xr24h-Cap (Tamsulosin Hcl) .... Take One Capsule Each Day When Needed 13)  Nitroglycerin 0.4 Mg Subl (Nitroglycerin) .... One Tablet Under Tongue Every 5 Minutes As Needed For Chest Pain---May Repeat Times Three 14)  Metoprolol Succinate 100 Mg Xr24h-Tab (Metoprolol Succinate) .... Take Two Tablets By Mouth Once Daily  Allergies (verified): 1)  ! [name]  Past History:  Past Medical History: Last updated: 05/12/2009 IMPLANTATION OF DEFIBRILLATOR, HX OF (ICD-V45.02)-Guidant Vitality [name]-Now explanted Explantation of Guidant Vitality Implant of Medtronic Virtuoso [id number] PERCUTANEOUS TRANSLUMINAL CORONARY ANGIOPLASTY, HX OF (ICD-V45.82)  Past Surgical History: Last updated: 04/20/2009 CATARACT EXTRACTION, HX OF (ICD-V45.61) TONSILLECTOMY, HX OF (ICD-V45.79) IMPLANTATION OF DEFIBRILLATOR, HX OF (ICD-V45.02)-Guidant Vitality [name] PERCUTANEOUS TRANSLUMINAL CORONARY ANGIOPLASTY, HX OF (ICD-V45.82)  Family History: Last updated: 11/24/2006 negative - cad, colon ca, prostate ca [name] lipids  Social History: Last updated: 11/24/2006 divorced 1 daughter owner/operator Optician, dispensing business and show production  Vital Signs:  Patient profile:   65 year old male Height:  72 inches Weight:      189.8 pounds BMI:     25.83 Pulse rate:   60 / minute Pulse rhythm:   irregular BP sitting:   96 / 68  (left arm) Cuff size:   regular  Vitals Entered By: [name] CMA (November 03, 2009 12:59 PM)  Physical Exam  General:  The patient was alert and oriented in no acute distress. HEENT Normal.  Neck veins were flat, carotids were brisk.  Lungs were clear.  Heart sounds were regular without  murmurs or gallops.  Abdomen was soft with active bowel sounds. There is no clubbing cyanosis or edema. Skin Warm and dry neuro exam is normal for him being of decreased hearing   EKG  Procedure date:  11/03/2009  Findings:      atrial fibrillation at 68 Intervals-/0.11/0.40 Axis is 9 Intermittent ventricular pacing and prior anterior wall MI   ICD Specifications Following MD:  [name] Manges, MD     ICD Vendor:  Medtronic     ICD Model Number:  [id number]     ICD Serial Number:  [name] ICD DOI:  04/27/2009     ICD Implanting MD:  [name] Manges, MD  Lead 1:    Location: RA     DOI: 11/18/2004     Model #: [id number]     Serial #: [id number]     Status: active Lead 2:    Location: RV     DOI: 11/18/2004     Model #: [id number]     Serial #: [id number]     Status: active  Indications::  ICM; SCD A-fib + coumadin  Explantation Comments: 04/27/2009 [employer] [name]/[id number] explanted  ICD Follow Up Battery Voltage:  3.15 V     Charge Time:  8.5 seconds     Underlying rhythm:  AF ICD Dependent:  No       ICD Device Measurements Atrium:  Amplitude: 3.6 mV, Impedance: 589 ohms,  Right Ventricle:  Amplitude: 20 mV, Impedance: 475 ohms, Threshold: 0.50 V at 0.40 msec Shock Impedance: 51/69 ohms   Episodes MS Episodes:  1     Percent Mode Switch:  100%     Coumadin:  Yes Shock:  0     ATP:  0     Nonsustained:  0     Atrial Therapies:  0 Ventricular Pacing:  21.6%  Brady Parameters Mode DDI     Lower Rate Limit:  60     Upper Rate Limit 100 PAV 180     Sensed AV Delay:  150  Tachy Zones VF:  222     VT:  OFF     VT1:  OFF     Next Cardiology Appt Due:  03/31/2010 Tech Comments:  PT IN AF 100% OF TIME. + COUMADIN.  NORMAL DEVICE FUNCTION. TURNED 1:1 SVT ON AND CHANGED MAX LEAD IMPEDANCE FOR LIA. ROV IN FEB 2012 W/SK. ENROLL PT IN [place].  [name]  November 03, 2009 1:15 PM  Impression & Recommendations:  Problem # 1:  ATRIAL FIBRILLATION (ICD-427.31) his atrial fibrillation  is permanent. His activity him measure is considerably down over the last 3 months. His average heart rate has exceeded 100 on some occasions and most days his heart rate exceeds 100 5280 beats per minute for some period of time. His heart rate control is inadequate. This may contribute to his exercise intolerance both directly as well as the worsening of his cardiomyopathy. He is ventricularly  paced only 25% of the time.    a Holter monitor will be helpful in trying to quantitate the burden of his rapid rates. Reassessment of left ventricular functionand his ischemic burden I think is appropriate we'll undertake this by initially by echo as cause for him is a daunting problem.  These may potentially open up options for resynchronization and augmented rate control as targets of therapy His updated medication list for this problem includes:    Aspirin Ec Low Dose 81 Mg Tbec (Aspirin) ..... Once daily    Warfarin Sodium 5 Mg Tabs (Warfarin sodium) .Marland Kitchen... As directed    Metoprolol Succinate 100 Mg Xr24h-tab (Metoprolol succinate) .Marland Kitchen... Take two tablets by mouth once daily  Problem # 2:  CORONARY ARTERY DISEASE (ICD-414.00) as above  Problem # 3:  SYSTOLIC HEART FAILURE, CHRONIC (ICD-428.22)  as above;  his QRS is narrow so resynchronization a part from a jump and AV junction ablation would not be indicated His updated medication list for this problem includes:    Lisinopril 5 Mg Tabs (Lisinopril) ..... Once daily    Aspirin Ec Low Dose 81 Mg Tbec (Aspirin) ..... Once daily    Warfarin Sodium 5 Mg Tabs (Warfarin sodium) .Marland Kitchen... As directed    Nitroglycerin 0.4 Mg Subl (Nitroglycerin) ..... One tablet under tongue every 5 minutes as needed for chest pain---may repeat times three    Metoprolol Succinate 100 Mg Xr24h-tab (Metoprolol succinate) .Marland Kitchen... Take two tablets by mouth once daily  Orders: Echocardiogram (Echo)  Problem # 4:  LEG AND FOOTPAIN (ICD-729.5) this may represent either statin or  diabetes neuropathy amongst others. We'll plan to hold his statin. I should note also that he is on simvastatin 80 which when overusing and will need to have down titrated  Patient Instructions: 1)  Your physician recommends that you schedule a follow-up appointment in: FEBRUARY WITH DR Graciela Husbands 2)  Your physician has recommended you make the following change in your medication: STOP SIMVASTATIN 3)  Your physician has requested that you have an echocardiogram.  Echocardiography is a painless test that uses sound waves to create images of your heart. It provides your doctor with information about the size and shape of your heart and how well your heart's chambers and valves are working.  This procedure takes approximately one hour. There are no restrictions for this procedure.

## 2010-03-30 NOTE — Progress Notes (Signed)
Summary: Nuclear Pre-Procedure  Phone Note Outgoing Call   Call placed by: Milana Na, EMT-P,  April 22, 2009 2:53 PM Summary of Call: Reviewed information on Myoview Information Sheet (see scanned document for further details).  Spoke with patient.     Nuclear Med Background Indications for Stress Test: Evaluation for Ischemia, Graft Patency, Stent Patency  Indications Comments: Pending AICD  generator replacement  History: Angioplasty, Defibrillator, Echo, Myocardial Infarction, Myocardial Perfusion Study, Stents  History Comments: '06 MI NSTEMI  '06 STENTS x 3  '06 Angioplasty '06 Defibrillator ICM, SCD '07 ECHO EF 40-50% 03/10 MPS EF 32% old inf. scar (-) ischemia    Symptoms: Fatigue with Exertion    Nuclear Pre-Procedure Cardiac Risk Factors: History of Smoking, Lipids Height (in): 72  Nuclear Med Study Referring MD:  Tonny Bollman, MD

## 2010-03-30 NOTE — Progress Notes (Signed)
Summary: RESULTS  Phone Note Call from Patient Call back at Home Phone [phone]   Summary of Call: Patient is requesting results of A1c.  Initial call taken by: Lamar Sprinkles, CMA,  December 21, 2009 11:52 AM  Follow-up for Phone Call        7.5%. Not at goal of 7% or less but not high enough to change medications. Will need to increase dietary adherence and be sure to get aerobic exercise 5 days a week. Repeat A1C in 3 months.  Follow-up by: Jacques Navy MD,  December 21, 2009 1:21 PM  Additional Follow-up for Phone Call Additional follow up Details #1::        mailbox full,.Marland KitchenMarland KitchenMarland KitchenLamar Sprinkles, CMA  December 23, 2009 6:29 PM   Left vm for pt that letter was mailed Additional Follow-up by: Lamar Sprinkles, CMA,  December 25, 2009 10:24 AM

## 2010-03-30 NOTE — Letter (Signed)
Summary: Implantable Device Instructions  Architectural technologist, Main Office  [address]   [place], [place] [address]   Phone: [phone]  Fax: [phone]      Implantable Device Instructions  You are scheduled for:  _____ Permanent Transvenous Pacemaker _____ Implantable Cardioverter Defibrillator _____ Implantable Loop Recorder ___X__ Generator Change  on 04/27/09 with Dr. Graciela Husbands.  1.  Please arrive at the Short Stay Center at [facility] at 0730 on the day of your procedure.  2.  Do not eat or drink the night before your procedure.  3. Only take 1/2 dose of Metformin the night before your procedure and do NOT take any the morning of your procedure.  4.  Plan for an overnight stay.  Bring your insurance cards and a list of your medications.  5.  Wash your chest and neck with antibacterial soap (any brand) the evening before and the morning of your procedure.  Rinse well.   *If you have ANY questions after you get home, please call the office [phone].  *Every attempt is made to prevent procedures from being rescheduled.  Due to the nauture of Electrophysiology, rescheduling can happen.  The physician is always aware and directs the staff when this occurs.

## 2010-03-30 NOTE — Miscellaneous (Signed)
  Clinical Lists Changes  Problems: Changed problem from IMPLANTATION OF DEFIBRILLATOR-GUIDANT VITALITY (ICD-V45.02) to IMPLANTATION OF DEFIBRILLATOR- MEDTRONIC VIRTUOSO [id number] (ICD-V45.02)        Allergies: 1)  ! [name]

## 2010-03-30 NOTE — Assessment & Plan Note (Signed)
Summary: TOE PAIN  STC   Vital Signs:  Patient profile:   65 year old male Height:      72 inches (182.88 cm) Weight:      190.75 pounds (86.70 kg) BMI:     25.96 O2 Sat:      95 % on Room air Temp:     97.6 degrees F (36.44 degrees C) oral Pulse rate:   73 / minute BP sitting:   120 / 74  (left arm) Cuff size:   regular  Vitals Entered By: Brenton Grills MA (December 08, 2009 5:03 PM)  O2 Flow:  Room air CC: Pt c/o Right Toe Pain/aj Is Patient Diabetic? Yes   Primary Care Provider:  [facility]  CC:  Pt c/o Right Toe Pain/aj.  History of Present Illness: Patient present c/o pain/numbness right great toe. He has had a callus with open wound which has improved over time. He reports that the numbness is limited to the medial aspect of the forefoot and the great toe. Has had no injury. He is diabetic. Has had no erythema, swelling or exquisite pain.  Current Medications (verified): 1)  Lisinopril 5 Mg  Tabs (Lisinopril) .... Once Daily 2)  Zolpidem Tartrate 10 Mg  Tabs (Zolpidem Tartrate) .... At Bedtime 3)  Aspirin Ec Low Dose 81 Mg  Tbec (Aspirin) .... Once Daily 4)  Multivitamins   Tabs (Multiple Vitamin) .... Once Daily 5)  Warfarin Sodium 5 Mg  Tabs (Warfarin Sodium) .... As Directed 6)  Vitamin B-12 500 Mcg  Tabs (Cyanocobalamin) .... Once Daily 7)  Desoximetasone 0.05 %  Crea (Desoximetasone) .... Apply Two Times A Day As Needed 8)  Viagra 100 Mg  Tabs (Sildenafil Citrate) .... Take 1/2-1 By Mouth As Directed 9)  Metformin Hcl 1000 Mg Tabs (Metformin Hcl) .Marland Kitchen.. 1 By Mouth Two Times A Day For Diabetes 10)  Flomax 0.4 Mg Xr24h-Cap (Tamsulosin Hcl) .... Take One Capsule Each Day When Needed 11)  Nitroglycerin 0.4 Mg Subl (Nitroglycerin) .... One Tablet Under Tongue Every 5 Minutes As Needed For Chest Pain---May Repeat Times Three 12)  Metoprolol Succinate 100 Mg Xr24h-Tab (Metoprolol Succinate) .... Take Two Tablets By Mouth Once Daily  Allergies (verified): 1)  ! [name]  Past History:  Family History: Last updated: 11/24/2006 negative - cad, colon ca, prostate ca [name] lipids  Social History: Last updated: 11/24/2006 divorced 1 daughter owner/operator Optician, dispensing business and show production  Past Medical History: LEG AND FOOTPAIN (ICD-729.5) SYSTOLIC HEART FAILURE, CHRONIC (ICD-428.22) ATRIAL FIBRILLATION (ICD-427.31) CORONARY ARTERY DISEASE (ICD-414.00) ISCHEMIC CARDIOMYOPATHY (ICD-414.8) HYPERLIPIDEMIA (ICD-272.4) PERCUTANEOUS TRANSLUMINAL CORONARY ANGIOPLASTY, HX OF (ICD-V45.82) MYOCARDIAL INFARCTION, HX OF (ICD-412) NEPHROLITHIASIS, HX OF (ICD-V13.01) NEOPLASM OF UNCERTAIN BEHAVIOR OF SKIN (ICD-238.2) HYPOGONADISM, MALE (ICD-257.2) DIABETES MELLITUS, TYPE II (ICD-250.00) IMPLANTATION OF DEFIBRILLATOR, HX OF (ICD-V45.02)-Guidant Vitality [name]-Now explanted Explantation of Guidant Vitality Implant of Medtronic Virtuoso [id number] PERCUTANEOUS TRANSLUMINAL CORONARY ANGIOPLASTY, HX OF (ICD-V45.82)  Past Surgical History: Reviewed history from 04/20/2009 and no changes required. CATARACT EXTRACTION, HX OF (ICD-V45.61) TONSILLECTOMY, HX OF (ICD-V45.79) IMPLANTATION OF DEFIBRILLATOR, HX OF (ICD-V45.02)-Guidant Vitality [name] PERCUTANEOUS TRANSLUMINAL CORONARY ANGIOPLASTY, HX OF (ICD-V45.82)  Family History: Reviewed history from 11/24/2006 and no changes required. negative - cad, colon ca, prostate ca [name] lipids  Social History: Reviewed history from 11/24/2006 and no changes required. 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    Physical Exam  General:  Well-developed,well-nourished,in no acute distress; alert,appropriate and cooperative throughout examination Neck:  supple.    Lungs:  normal respiratory effort and normal breath sounds.   Heart:  normal rate and regular rhythm.   Abdomen:  soft and non-tender.   Msk:  mild valgus deformity right 1st MTP Pulses:  2+ DP right foot, good capillary refill toes Extremities:  No clubbing, cyanosis, edema, or deformity noted with normal full range of motion of all joints.   Neurologic:  alert & oriented X3 and cranial nerves II-XII intact. Decreased sensation right great toes to deep vibration as well as decreased sensation lateral and dorsal forefoot. Decrease lighttouch/pinprick right foot  Skin:  large callus plantar aspect right great toe with a shallow wound in the center. No drainage. No swelling. Psych:  Oriented X3 and memory intact for recent and remote.     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 There is no paronychia, no fungal infection. He does have loss of sensation and he admits that the pain in his toe is a burning sensation.  Plan - no treatment at this time. For increasing pain will use gabapentin  His updated medication list for this problem includes:    Lisinopril 5 Mg Tabs (Lisinopril) ..... Once daily    Aspirin Ec Low Dose 81 Mg Tbec (Aspirin) ..... Once daily    Metformin Hcl 1000 Mg Tabs (Metformin hcl) .Marland Kitchen... 1 by mouth two times a day for diabetes  Problem # 2:  CALLUS, RIGHT FOOT (ICD-700) Healing wound in callus.  Plan - patient made aware of the dangers of a peripheral neuropathy and the risk of infection with callus, especially if an open wound develops.  Problem # 3:  DIABETES MELLITUS, TYPE II (ICD-250.00) Overdue for A1C.  Plan - lab with recommendations to follow.  His updated medication list for this problem includes:    Lisinopril 5 Mg Tabs (Lisinopril) ..... Once daily    Aspirin Ec Low Dose 81 Mg Tbec (Aspirin) ..... Once daily    Metformin Hcl 1000 Mg Tabs (Metformin hcl) .Marland Kitchen... 1 by  mouth two times a day for diabetes  Complete Medication List: 1)  Lisinopril 5 Mg Tabs (Lisinopril) .... Once daily 2)  Zolpidem Tartrate 10 Mg Tabs (Zolpidem tartrate) .... At bedtime 3)  Aspirin Ec Low Dose 81 Mg Tbec (Aspirin) .... Once daily 4)  Multivitamins Tabs (Multiple vitamin) .... Once daily 5)  Warfarin Sodium 5 Mg Tabs (Warfarin sodium) .... As directed 6)  Vitamin B-12 500 Mcg Tabs (Cyanocobalamin) .... Once daily 7)  Desoximetasone 0.05 % Crea (Desoximetasone) .... Apply two times a day as needed 8)  Viagra 100 Mg Tabs (Sildenafil citrate) .... Take 1/2-1 by mouth as directed 9)  Metformin Hcl 1000 Mg Tabs (Metformin hcl) .Marland Kitchen.. 1 by mouth two times a day for diabetes 10)  Flomax 0.4 Mg Xr24h-cap (Tamsulosin hcl) .... Take one capsule each day when needed 11)  Nitroglycerin 0.4 Mg Subl (Nitroglycerin) .... One tablet under tongue every 5 minutes as needed for chest pain---may repeat times three 12)  Metoprolol Succinate 100 Mg Xr24h-tab (Metoprolol succinate) .... Take two tablets by mouth once daily

## 2010-03-30 NOTE — Medication Information (Signed)
Summary: rov/tm  Anticoagulant Therapy  Managed by: Bethena Midget, RN, BSN Referring MD: Tonny Bollman, MD PCP: Link Snuffer MD: Gala Romney MD, Reuel Boom Indication 1: Atrial Fibrillation (ICD-427.31) Lab Used: [facility] Site: [facility] INR POC 2.7 INR RANGE 2 - 3  Dietary changes: no    Health status changes: no    Bleeding/hemorrhagic complications: no    Recent/future hospitalizations: no    Any changes in medication regimen? no    Recent/future dental: no  Any missed doses?: no       Is patient compliant with meds? yes       Allergies: 1)  ! [name]  Anticoagulation Management History:      The patient is taking warfarin and comes in today for a routine follow up visit.  Positive risk factors for bleeding include presence of serious comorbidities.  Negative risk factors for bleeding include an age less than 63 years old.  The bleeding index is 'intermediate risk'.  Positive CHADS2 values include History of Diabetes.  Negative CHADS2 values include Age > 65 years old.  The start date was 02/22/2005.  His last INR was 2.8 ratio.  Anticoagulation responsible provider: Omaira Mellen MD, Reuel Boom.  INR POC: 2.7.  Cuvette Lot#: [id number].  Exp: 10/2010.    Anticoagulation Management Assessment/Plan:      The patient's current anticoagulation dose is Warfarin sodium 5 mg  tabs: as directed.  The target INR is 2 - 3.  The next INR is due 09/29/2009.  Anticoagulation instructions were given to patient.  Results were reviewed/authorized by Bethena Midget, RN, BSN.  He was notified by Bethena Midget, RN, BSN.         Prior Anticoagulation Instructions: INR 2.5 Continue 2.5mg  everyday except 1.25mg s on Thursdays. Recheck in 4 weeks.   Current Anticoagulation Instructions: INR 2.7 Continue 2.5mg s everyday except 1.25mg s on Thursdays. Recheck in 4 weeks.

## 2010-03-30 NOTE — Progress Notes (Signed)
Summary: Refill--Zolpidem  Phone Note Refill Request Message from:  Fax from Pharmacy on October 20, 2009 3:55 PM  Refills Requested: Medication #1:  ZOLPIDEM TARTRATE 10 MG  TABS at bedtime please Advise refill  Initial call taken by: Ami Bullins CMA,  October 20, 2009 3:55 PM  Follow-up for Phone Call        ok to refill as needed  Follow-up by: Jacques Navy MD,  October 20, 2009 5:57 PM    Prescriptions: ZOLPIDEM TARTRATE 10 MG  TABS (ZOLPIDEM TARTRATE) at bedtime  #30 x 5   Entered by:   Lucious Groves CMA   Authorized by:   Jacques Navy MD   Signed by:   Lucious Groves CMA on 10/21/2009   Method used:   Telephoned to ...       [facility] [address] (retail)       [address] [place], [place]  [address]       Ph: [phone]       Fax: [phone]   RxID:   [id number]

## 2010-03-30 NOTE — Progress Notes (Signed)
Summary: Pharmacy  Phone Note From Pharmacy   Caller: [facility] Summary of Call: Received  call from [facility] who states that pt meds have been trasferred due to [facility] closing. Patient is requesting a refill on "cream for mouth ulcers" which states used long time ago. Transferred records has no info on this. Please advise Thanks.Marland KitchenMarland KitchenMarland KitchenAlvy Beal Archie CMA  October 07, 2009 11:00 AM   Follow-up for Phone Call        Topicort - desoximetasone-can be used for mouth ulcers. I generally prescribe the ointment 0.25% for this purpose.  Follow-up by: Jacques Navy MD,  October 07, 2009 12:54 PM    New/Updated Medications: DESOXIMETASONE 0.05 %  CREA (DESOXIMETASONE) apply two times a day as needed Prescriptions: DESOXIMETASONE 0.05 %  CREA (DESOXIMETASONE) apply two times a day as needed  #1 tube x 0   Entered by:   Lamar Sprinkles, CMA   Authorized by:   Jacques Navy MD   Signed by:   Lamar Sprinkles, CMA on 10/07/2009   Method used:   Electronically to        [facility] [address] [phone]* (retail)       [address] [place], [place]  [address]       Ph: [phone]       Fax: [phone]   RxID:   [id number]

## 2010-03-30 NOTE — Medication Information (Signed)
Summary: [name]  Anticoagulant Therapy  Managed by: Cloyde Reams, RN, BSN Referring MD: Tonny Bollman, MD PCP: Link Snuffer MD: Eden Emms MD, Theron Arista Indication 1: Atrial Fibrillation (ICD-427.31) Lab Used: [facility] Site: [facility] INR POC 2.6 INR RANGE 2 - 3  Dietary changes: no    Health status changes: no    Bleeding/hemorrhagic complications: no    Recent/future hospitalizations: no    Any changes in medication regimen? no    Recent/future dental: no  Any missed doses?: no       Is patient compliant with meds? yes       Allergies: 1)  ! [name]  Anticoagulation Management History:      The patient is taking warfarin and comes in today for a routine follow up visit.  Positive risk factors for bleeding include presence of serious comorbidities.  Negative risk factors for bleeding include an age less than 88 years old.  The bleeding index is 'intermediate risk'.  Positive CHADS2 values include History of Diabetes.  Negative CHADS2 values include Age > 82 years old.  The start date was 02/22/2005.  His last INR was 2.8 ratio.  Anticoagulation responsible provider: Eden Emms MD, Theron Arista.  INR POC: 2.6.  Cuvette Lot#: [id number].  Exp: 12/2010.    Anticoagulation Management Assessment/Plan:      The patient's current anticoagulation dose is Warfarin sodium 5 mg  tabs: as directed.  The target INR is 2 - 3.  The next INR is due 12/01/2009.  Anticoagulation instructions were given to patient.  Results were reviewed/authorized by Cloyde Reams, RN, BSN.  He was notified by Harrel Carina, PharmD canidate.         Prior Anticoagulation Instructions: INR 2.6  Continue on same dosage 2.5mg  daily except 1.25mg  on Thursdays.  Recheck in 4 weeks.    Current Anticoagulation Instructions: INR 2.6  Continue taking 1/2 tablet every day except take 1/4 tablet on Thursdays. Re-check INR in 4 weeks.

## 2010-03-30 NOTE — Progress Notes (Signed)
  Phone Note Other Incoming   Caller: pt Summary of Call: Pt wanted to know what his last A1C was. I advised him that is was 7.9% and he wanted to know if there was any advisement for this? Please Advise. Initial call taken by: Ami Bullins CMA,  March 02, 2009 4:11 PM  Follow-up for Phone Call        see last office note under diabetes - instructions were there as well as a new Rx was called in.  Follow-up by: Jacques Navy MD,  March 02, 2009 6:19 PM  Additional Follow-up for Phone Call Additional follow up Details #1::        lmovm for pt to call back Additional Follow-up by: Ami Bullins CMA,  March 03, 2009 8:30 AM    Additional Follow-up for Phone Call Additional follow up Details #2::    informed pt and advised him that he would be due for A1C in feb. Follow-up by: Ami Bullins CMA,  March 03, 2009 8:54 AM

## 2010-03-30 NOTE — Letter (Signed)
   [facility] [address] [place], [place]  [address] Phone: [phone]      December 24, 2009   [facility] [address] [place], [place] [address]  RE:  LAB RESULTS  Dear  Mr. Hitson,  The following is an interpretation of your most recent lab tests.  Please take note of any instructions provided or changes to medications that have resulted from your lab work.    DIABETIC STUDIES:  Improved - continue management Blood Glucose: 127   HgbA1C: 7.5     A1C @ 7.5% is above goal of 7% but below the threshold for changing medications (8%). Need to work on dietary adherence and regular exercise. Follow-up lab in 3 months.    Sincerely Yours,    Jacques Navy MD

## 2010-03-30 NOTE — Progress Notes (Signed)
Summary: Zolpidem refill  Phone Note Refill Request Message from:  Fax from Pharmacy on June 12, 2009 3:06 PM  Refills Requested: Medication #1:  ZOLPIDEM TARTRATE 10 MG  TABS at bedtime Initial call taken by: Lucious Groves,  June 12, 2009 3:06 PM    Prescriptions: ZOLPIDEM TARTRATE 10 MG  TABS (ZOLPIDEM TARTRATE) at bedtime  #30 x 1   Entered by:   Lucious Groves   Authorized by:   Etta Grandchild MD   Signed by:   Lucious Groves on 06/12/2009   Method used:   Telephoned to ...       [facility] ([facility]ail)       [address] [place], [place]  [address]       Ph: [phone] or [phone]       Fax: [phone]   RxID:   [id number] ZOLPIDEM TARTRATE 10 MG  TABS (ZOLPIDEM TARTRATE) at bedtime  #30 x 1   Entered and Authorized by:   Etta Grandchild MD   Signed by:   Etta Grandchild MD on 06/12/2009   Method used:   Historical   RxID:   [id number]

## 2010-03-30 NOTE — Medication Information (Signed)
Summary: rov/mw   Anticoagulant Therapy  Managed by: Weston Brass, PharmD Referring MD: Tonny Bollman, MD PCP: Link Snuffer MD: Myrtis Ser MD, Tinnie Gens Indication 1: Atrial Fibrillation (ICD-427.31) Lab Used: [facility] Site: [facility] INR POC 2.2 INR RANGE 2 - 3  Dietary changes: no    Health status changes: no    Bleeding/hemorrhagic complications: no    Recent/future hospitalizations: no    Any changes in medication regimen? no    Recent/future dental: no  Any missed doses?: no       Is patient compliant with meds? yes       Current Medications (verified): 1)  Lisinopril 5 Mg  Tabs (Lisinopril) .... Once Daily 2)  Zolpidem Tartrate 10 Mg  Tabs (Zolpidem Tartrate) .... At Bedtime 3)  Aspirin Ec Low Dose 81 Mg  Tbec (Aspirin) .... Once Daily 4)  Multivitamins   Tabs (Multiple Vitamin) .... Once Daily 5)  Warfarin Sodium 5 Mg  Tabs (Warfarin Sodium) .... As Directed 6)  Vitamin B-12 500 Mcg  Tabs (Cyanocobalamin) .... Once Daily 7)  Desoximetasone 0.05 %  Crea (Desoximetasone) .... Apply Two Times A Day As Needed 8)  Viagra 100 Mg  Tabs (Sildenafil Citrate) .... Take 1/2-1 By Mouth As Directed 9)  Metformin Hcl 1000 Mg Tabs (Metformin Hcl) .Marland Kitchen.. 1 By Mouth Two Times A Day For Diabetes 10)  Flomax 0.4 Mg Xr24h-Cap (Tamsulosin Hcl) .... Take One Capsule Each Day When Needed 11)  Nitroglycerin 0.4 Mg Subl (Nitroglycerin) .... One Tablet Under Tongue Every 5 Minutes As Needed For Chest Pain---May Repeat Times Three 12)  Metoprolol Succinate 100 Mg Xr24h-Tab (Metoprolol Succinate) .... Take Two Tablets By Mouth Once Daily  Allergies: 1)  ! [name]  Anticoagulation Management History:      The patient is taking warfarin and comes in today for a routine follow up visit.  Positive risk factors for bleeding include presence of serious comorbidities.  Negative risk factors for bleeding include an age less than 6 years old.  The bleeding index is 'intermediate risk'.   Positive CHADS2 values include History of CHF and History of Diabetes.  Negative CHADS2 values include Age > 5 years old.  The start date was 02/22/2005.  His last INR was 2.8 ratio.  Anticoagulation responsible provider: Myrtis Ser MD, Tinnie Gens.  INR POC: 2.2.  Cuvette Lot#: [id number].  Exp: 01/2011.    Anticoagulation Management Assessment/Plan:      The patient's current anticoagulation dose is Warfarin sodium 5 mg  tabs: as directed.  The target INR is 2 - 3.  The next INR is due 02/23/2010.  Anticoagulation instructions were given to patient.  Results were reviewed/authorized by Weston Brass, PharmD.  He was notified by Weston Brass PharmD.         Prior Anticoagulation Instructions: INR 2.0 Continue taking a quarter tablet on thursday. And a half tablet all other days. Recheck in 4 weeks.   Current Anticoagulation Instructions: INR 2.2  Continue same dose of 1/2 tablet every day except 1/4 tablet on Tuesday.  Recheck INR in 4 weeks.

## 2010-03-30 NOTE — Cardiovascular Report (Signed)
Summary: Office Visit   Office Visit   Imported By: Roderic Ovens 04/20/2009 16:27:52  _____________________________________________________________________  External Attachment:    Type:   Image     Comment:   External Document

## 2010-03-30 NOTE — Medication Information (Signed)
Summary: rov/tm  Anticoagulant Therapy  Managed by: Bethena Midget, RN, BSN Referring MD: Tonny Bollman, MD PCP: Link Snuffer MD: Eden Emms MD, Theron Arista Indication 1: Atrial Fibrillation (ICD-427.31) Lab Used: [facility] Site: [facility] INR POC 2.5 INR RANGE 2 - 3  Dietary changes: no    Health status changes: no    Bleeding/hemorrhagic complications: no    Recent/future hospitalizations: no    Any changes in medication regimen? yes       Details: Angrogel now taking 1/2 pack daily  Recent/future dental: no  Any missed doses?: no       Is patient compliant with meds? yes       Allergies: 1)  ! [name]  Anticoagulation Management History:      The patient is taking warfarin and comes in today for a routine follow up visit.  Positive risk factors for bleeding include presence of serious comorbidities.  Negative risk factors for bleeding include an age less than 102 years old.  The bleeding index is 'intermediate risk'.  Positive CHADS2 values include History of Diabetes.  Negative CHADS2 values include Age > 6 years old.  The start date was 02/22/2005.  His last INR was 2.8 ratio.  Anticoagulation responsible provider: Eden Emms MD, Theron Arista.  INR POC: 2.5.  Cuvette Lot#: [id number].  Exp: 09/2010.    Anticoagulation Management Assessment/Plan:      The patient's current anticoagulation dose is Warfarin sodium 5 mg  tabs: as directed.  The target INR is 2 - 3.  The next INR is due 09/01/2009.  Anticoagulation instructions were given to patient.  Results were reviewed/authorized by Bethena Midget, RN, BSN.  He was notified by Bethena Midget, RN, BSN.         Prior Anticoagulation Instructions: INR 1.8 Today take extra 1.25mg s then resume 2.5mg  daily except 1.25mg  on Thursdays. Recheck in 4 weeks.   Current Anticoagulation Instructions: INR 2.5 Continue 2.5mg  everyday except 1.25mg s on Thursdays. Recheck in 4 weeks.

## 2010-03-30 NOTE — Procedures (Signed)
Summary: wch/ gd    Allergies: 1)  ! [name]   ICD Specifications Following MD:  Sherryl Manges, MD     ICD Vendor:  [employer]     ICD Model Number:  [id number]     ICD Serial Number:  [id number] ICD DOI:  04/27/2009     ICD Implanting MD:  Sherryl Manges, MD  Lead 1:    Location: RA     DOI: 11/18/2004     Model #: [id number]     Serial #: [id number]     Status: active Lead 2:    Location: RV     DOI: 11/18/2004     Model #: [id number]     Serial #: [id number]     Status: active  Indications::  ICM; SCD A-fib + coumadin  Explantation Comments: 04/27/2009 [employer] [name] explanted  ICD Follow Up Remote Check?  No Battery Voltage:  3.19 V     Underlying rhythm:  AFIB ICD Dependent:  No       ICD Device Measurements Atrium:  Amplitude: 4.1 mV, Impedance: 589 ohms,  Right Ventricle:  Amplitude: 20 mV, Impedance: 475 ohms, Threshold: 1.0 V at 0.4 msec Shock Impedance: 49/69 ohms   Episodes MS Episodes:  1     Percent Mode Switch:  100%     Coumadin:  Yes Shock:  0     ATP:  0     Nonsustained:  0     Atrial Pacing:  <0.1%     Ventricular Pacing:  15.6%  Brady Parameters Mode MVP     Lower Rate Limit:  50     Upper Rate Limit 120 PAV 180     Sensed AV Delay:  150  Tachy Zones VF:  222     VT:  OFF     VT1:  OFF     Next Cardiology Appt Due:  05/13/2009 Tech Comments:  Wound check appt. Pt with moderate hematoma.  Pressure dressing applied.  ROV on 3-16 to reassess.  INR today was 2.3.  Pt to take half dose Coumadin today and tomorrow to let INR drift down a little to help with hematoma.  Pt with T wave oversensing after V pacing.  Unable to program around by changing sensing vectors, T wave oversensing with R wave undersensing at 1.6mV.  Princella Ion with [employer] helped to evaluate patient.  V blanking post VP changed from 200 to .  V sensitivity set at 0.58mV and upper sensor rate set to 100bpm.  This eliminated T wave oversensing.  Histagrams not accurate because of frequency of  T wave oversensing.  Will re-evaluate rate control during afib at appt with Dr Graciela Husbands on Wed 3-16.  Pt aware and agrees with plan. Gypsy Balsam RN BSN  May 11, 2009 2:01 PM

## 2010-03-30 NOTE — Progress Notes (Signed)
Summary: procedure questions   Phone Note Call from Patient Call back at Home Phone [phone]   Caller: Patient Reason for Call: Talk to Nurse Summary of Call: has some questions about procedure on monday Initial call taken by: Migdalia Dk,  April 23, 2009 2:33 PM  Follow-up for Phone Call        Pts questions answered. Follow-up by: Duncan Dull, RN, BSN,  April 23, 2009 2:49 PM

## 2010-03-30 NOTE — Miscellaneous (Signed)
  Clinical Lists Changes       Medication Administration  Injection # 1:    Medication: Testosterone Cypionat 200mg  ing    Diagnosis: HYPOGONADISM, MALE (ICD-257.2)    Route: IM    Site: RUOQ gluteus    Exp Date: 06/2010    Lot #: [id number]    Mfr: paddock    Patient tolerated injection without complications    Given by: Bethena Midget, RN, BSN (March 24, 2009 9:39 AM)

## 2010-03-30 NOTE — Cardiovascular Report (Signed)
Summary: Office Visit Remote   Office Visit Remote   Imported By: Roderic Ovens 03/05/2009 12:31:44  _____________________________________________________________________  External Attachment:    Type:   Image     Comment:   External Document

## 2010-03-30 NOTE — Procedures (Signed)
Summary: [facility]    Allergies: 1)  ! * Salmon  Tech Comments:  Pt called earlier today c/o oozing at device site.  Dr Graciela Husbands examined patient.  Pressure held for 10 minutes, then bandage painted with betadine and another dressing applied over steri-strips.  Pt advised to call  back if more bleeding before wound check.  Pt aware and agrees with plan. Gypsy Balsam RN BSN  April 28, 2009 11:48 AM   ICD Specifications Following MD:  Sherryl Manges, MD     ICD Vendor:  [facility] [employer]     ICD Model Number:  [name]     ICD Serial Number:  [id number] ICD DOI:  11/18/2004     ICD Implanting MD:  Sherryl Manges, MD  Lead 1:    Location: [facility]     DOI: 11/18/2004     Model #: [id number]     Serial #: [id number]     Status: active Lead 2:    Location: RV     DOI: 11/18/2004     Model #: [id number]     Serial #: [id number]     Status: active  Indications::  ICM; SCD  Explantation Comments: A-fib + coumadin  Episodes Coumadin:  Yes  Brady Parameters Mode DDDR     Lower Rate Limit:  50     Upper Rate Limit 120 PAV 250     Sensed AV Delay:  160  Tachy Zones VF:  240     VT:  220     VT1:  200

## 2010-03-30 NOTE — Progress Notes (Signed)
Summary: test result   Phone Note Call from Patient Call back at Home Phone [phone]   Caller: Patient Reason for Call: Talk to Nurse, Lab or Test Results Details for Reason: echo  Initial call taken by: Lorne Skeens,  December 14, 2009 12:41 PM  Follow-up for Phone Call        spoke w/pt and adv ef appears to be the same and will be sure Dr. Graciela Husbands reviews results.   Follow-up by: Claris Gladden RN,  December 14, 2009 1:20 PM  Additional Follow-up for Phone Call Additional follow up Details #1::        thaniks Additional Follow-up by: Nathen May, MD, [facility],  December 15, 2009 6:19 PM

## 2010-03-30 NOTE — Progress Notes (Signed)
Summary: question regarding bill / office visit on 3/16   Phone Note Call from Patient Call back at Home Phone [phone]   Caller: Patient Reason for Call: Talk to Nurse Summary of Call: pt has invoice for $40,000 has question why wasn't the office visit on 05-13-09 include in the 40,000. pt spoke with deanne at [facility] cone was told to call the office .  Initial call taken by: Lorne Skeens,  June 23, 2009 10:13 AM  Follow-up for Phone Call        Spoke with patient.  The ov on 05-13-09 should not have been billed per Cydney Ok.  I will email pro fee to have charge taken off account.  I also told patient they could call pro fee if they had anymore questions abount their account. Follow-up by: Pricilla Loveless,  June 25, 2009 8:43 AM

## 2010-03-30 NOTE — Medication Information (Signed)
Summary: ccr   Anticoagulant Therapy  Managed by: Lyna Poser, PharmD Referring MD: Tonny Bollman, MD PCP: Link Snuffer MD: Clifton Esaias MD,Christopher Indication 1: Atrial Fibrillation (ICD-427.31) Lab Used: [facility] Site: [facility] INR POC 2 INR RANGE 2 - 3  Dietary changes: no    Health status changes: no    Bleeding/hemorrhagic complications: no    Recent/future hospitalizations: no    Any changes in medication regimen? no    Recent/future dental: no  Any missed doses?: no       Is patient compliant with meds? yes       Allergies: 1)  ! [name]  Anticoagulation Management History:      The patient is taking warfarin and comes in today for a routine follow up visit.  Positive risk factors for bleeding include presence of serious comorbidities.  Negative risk factors for bleeding include an age less than 19 years old.  The bleeding index is 'intermediate risk'.  Positive CHADS2 values include History of CHF and History of Diabetes.  Negative CHADS2 values include Age > 34 years old.  The start date was 02/22/2005.  His last INR was 2.8 ratio.  Anticoagulation responsible provider: Clifton Altair MD,Christopher.  INR POC: 2.  Cuvette Lot#: [id number].  Exp: 12/2010.    Anticoagulation Management Assessment/Plan:      The patient's current anticoagulation dose is Warfarin sodium 5 mg  tabs: as directed.  The target INR is 2 - 3.  The next INR is due 01/26/2010.  Anticoagulation instructions were given to patient.  Results were reviewed/authorized by Lyna Poser, PharmD.         Prior Anticoagulation Instructions: INR 1.6  Today, October 4th, take Coumadin 1 tab (5 mg). Then, continue taking Coumadin 0.5 tab (2.5 mg) on all days except Coumadin 0.25 tab (1.25 mg) on Thursdays. Return to clinic in 2 weeks.   Current Anticoagulation Instructions: INR 2.0 Continue taking a quarter tablet on thursday. And a half tablet all other days. Recheck in 4 weeks.

## 2010-03-30 NOTE — Medication Information (Signed)
Summary: [name]      Allergies Added:  Anticoagulant Therapy  Managed by: Reina Fuse, PharmD Referring MD: Tonny Bollman, MD PCP: Link Snuffer MD: Shirlee Latch MD, Dalton Indication 1: Atrial Fibrillation (ICD-427.31) Lab Used: [facility] Site: [facility] INR POC 1.6 INR RANGE 2 - 3  Dietary changes: yes       Details: Maybe has eaten a few more greens. Eats greens daily.  Health status changes: no    Bleeding/hemorrhagic complications: no    Recent/future hospitalizations: no    Any changes in medication regimen? no    Recent/future dental: no  Any missed doses?: yes     Details: possibly missed a dose a few weeks ago  Is patient compliant with meds? yes       Current Medications (verified): 1)  Lisinopril 5 Mg  Tabs (Lisinopril) .... Once Daily 2)  Zolpidem Tartrate 10 Mg  Tabs (Zolpidem Tartrate) .... At Bedtime 3)  Aspirin Ec Low Dose 81 Mg  Tbec (Aspirin) .... Once Daily 4)  Multivitamins   Tabs (Multiple Vitamin) .... Once Daily 5)  Warfarin Sodium 5 Mg  Tabs (Warfarin Sodium) .... As Directed 6)  Vitamin B-12 500 Mcg  Tabs (Cyanocobalamin) .... Once Daily 7)  Desoximetasone 0.05 %  Crea (Desoximetasone) .... Apply Two Times A Day As Needed 8)  Viagra 100 Mg  Tabs (Sildenafil Citrate) .... Take 1/2-1 By Mouth As Directed 9)  Metformin Hcl 1000 Mg Tabs (Metformin Hcl) .Marland Kitchen.. 1 By Mouth Two Times A Day For Diabetes 10)  Flomax 0.4 Mg Xr24h-Cap (Tamsulosin Hcl) .... Take One Capsule Each Day When Needed 11)  Nitroglycerin 0.4 Mg Subl (Nitroglycerin) .... One Tablet Under Tongue Every 5 Minutes As Needed For Chest Pain---May Repeat Times Three 12)  Metoprolol Succinate 100 Mg Xr24h-Tab (Metoprolol Succinate) .... Take Two Tablets By Mouth Once Daily  Allergies (verified): 1)  ! [name]  Anticoagulation Management History:      The patient is taking warfarin and comes in today for a routine follow up visit.  Positive risk factors for bleeding include presence  of serious comorbidities.  Negative risk factors for bleeding include an age less than 23 years old.  The bleeding index is 'intermediate risk'.  Positive CHADS2 values include History of CHF and History of Diabetes.  Negative CHADS2 values include Age > 71 years old.  The start date was 02/22/2005.  His last INR was 2.8 ratio.  Anticoagulation responsible provider: Shirlee Latch MD, Dalton.  INR POC: 1.6.  Cuvette Lot#: [id number].  Exp: 12/2010.    Anticoagulation Management Assessment/Plan:      The patient's current anticoagulation dose is Warfarin sodium 5 mg  tabs: as directed.  The target INR is 2 - 3.  The next INR is due 12/15/2009.  Anticoagulation instructions were given to patient.  Results were reviewed/authorized by Reina Fuse, PharmD.  He was notified by Reina Fuse PharmD.         Prior Anticoagulation Instructions: INR 2.6  Continue taking 1/2 tablet every day except take 1/4 tablet on Thursdays. Re-check INR in 4 weeks.   Current Anticoagulation Instructions: INR 1.6  Today, October 4th, take Coumadin 1 tab (5 mg). Then, continue taking Coumadin 0.5 tab (2.5 mg) on all days except Coumadin 0.25 tab (1.25 mg) on Thursdays. Return to clinic in 2 weeks.

## 2010-03-30 NOTE — Progress Notes (Signed)
Summary: echo results   Phone Note Call from Patient Call back at Home Phone [phone]   Caller: Patient Summary of Call: echo results Initial call taken by: Judie Grieve,  December 21, 2009 11:12 AM  Follow-up for Phone Call        adv Mr. Dowdell that Dr. Graciela Husbands would be calling him regarding his results.   Additional Follow-up for Phone Call Additional follow up Details #1::        spoke with patient  echo looks about the same Additional Follow-up by: Nathen May, MD, [facility],  December 22, 2009 5:55 PM

## 2010-03-30 NOTE — Progress Notes (Signed)
Summary: pt has question regarding device   Phone Note Call from Patient Call back at Home Phone [phone]   Caller: Patient Reason for Call: Talk to Nurse, Talk to Doctor Summary of Call: per pt call he had defib placed yesterday and he had a question and wants to talk to nurse Initial call taken by: Omer Jack,  April 28, 2009 8:58 AM  Follow-up for Phone Call        Amber s/w the pt and answered his questions. He will come by this afternoon so that we can take a look at his wound d/t bleeding. Two fifty cent size blood spots on bandage, not currently bleeding.  Follow-up by: Duncan Dull, RN, BSN,  April 28, 2009 10:10 AM

## 2010-03-30 NOTE — Medication Information (Signed)
Summary: rov/ewj  Anticoagulant Therapy  Managed by: Bethena Midget, RN, BSN Referring MD: Tonny Bollman, MD PCP: Link Snuffer MD: Johney Frame MD, Fayrene Fearing Indication 1: Atrial Fibrillation (ICD-427.31) Lab Used: [facility] Site: [facility] INR POC 2.0 INR RANGE 2 - 3  Dietary changes: no    Health status changes: no    Bleeding/hemorrhagic complications: no    Recent/future hospitalizations: no    Any changes in medication regimen? no    Recent/future dental: no  Any missed doses?: no       Is patient compliant with meds? yes      Comments: Pt. saw Dr. Graciela Husbands today to evaluate his device site. Per [facility] Dr. Graciela Husbands is ok with pt. resuming his normal coumadin dose.   Allergies: 1)  ! [name]  Anticoagulation Management History:      The patient is taking warfarin and comes in today for a routine follow up visit.  Positive risk factors for bleeding include presence of serious comorbidities.  Negative risk factors for bleeding include an age less than 57 years old.  The bleeding index is 'intermediate risk'.  Positive CHADS2 values include History of Diabetes.  Negative CHADS2 values include Age > 26 years old.  The start date was 02/22/2005.  His last INR was 2.8 ratio.  Anticoagulation responsible provider: Lus Kriegel MD, Fayrene Fearing.  INR POC: 2.0.  Cuvette Lot#: [id number].  Exp: 06/2010.    Anticoagulation Management Assessment/Plan:      The patient's current anticoagulation dose is Warfarin sodium 5 mg  tabs: as directed.  The target INR is 2 - 3.  The next INR is due 06/09/2009.  Anticoagulation instructions were given to patient.  Results were reviewed/authorized by Bethena Midget, RN, BSN.  He was notified by Bethena Midget, RN, BSN.         Prior Anticoagulation Instructions: INR 2.3 Take 1.25mg s daily unitl device recheck on Wednesday.   Current Anticoagulation Instructions: INR 2.0 Resume 2.5mg s daily except 1.25mg s on Thursdays. Recheck in 4 weeks.    Medication  Administration  Injection # 1:    Medication: Testosterone Cypionat 200mg  ing    Diagnosis: HYPOGONADISM, MALE (ICD-257.2)    Route: IM    Site: LUOQ gluteus    Patient tolerated injection without complications    Given by: Bethena Midget, RN, BSN (May 13, 2009 10:20 AM)

## 2010-03-30 NOTE — Medication Information (Signed)
Summary: f/u coumadin appt with klein at 9:20  Anticoagulant Therapy  Managed by: Bethena Midget, RN, BSN Referring MD: Tonny Bollman, MD PCP: Link Snuffer MD: Jens Som MD, Arlys John Indication 1: Atrial Fibrillation (ICD-427.31) Lab Used: [facility] Site: [facility] INR POC 1.8 INR RANGE 2 - 3  Dietary changes: no    Health status changes: no    Bleeding/hemorrhagic complications: no    Recent/future hospitalizations: no    Any changes in medication regimen? yes       Details: Now on Testerone Gel, not injection.   Recent/future dental: no  Any missed doses?: yes     Details: missed a dose approx. 2 wks ago  Is patient compliant with meds? yes      Comments: Suggested earlier return but pt states he couldn't due to high co-pay.   Allergies: 1)  ! [name]  Anticoagulation Management History:      The patient is taking warfarin and comes in today for a routine follow up visit.  Positive risk factors for bleeding include presence of serious comorbidities.  Negative risk factors for bleeding include an age less than 24 years old.  The bleeding index is 'intermediate risk'.  Positive CHADS2 values include History of Diabetes.  Negative CHADS2 values include Age > 48 years old.  The start date was 02/22/2005.  His last INR was 2.8 ratio.  Anticoagulation responsible provider: Jens Som MD, Arlys John.  INR POC: 1.8.  Cuvette Lot#: [id number].  Exp: 09/2010.    Anticoagulation Management Assessment/Plan:      The patient's current anticoagulation dose is Warfarin sodium 5 mg  tabs: as directed.  The target INR is 2 - 3.  The next INR is due 08/04/2009.  Anticoagulation instructions were given to patient.  Results were reviewed/authorized by Bethena Midget, RN, BSN.  He was notified by Bethena Midget, RN, BSN.         Prior Anticoagulation Instructions: INR 2.0  Take 3/4 tablet (3.75 mg) today then resume same dose of 1/2 tablet (2.5mg ) daily except 1/4 tablet (1.25mg ) on  Thursday  Current Anticoagulation Instructions: INR 1.8 Today take extra 1.25mg s then resume 2.5mg  daily except 1.25mg  on Thursdays. Recheck in 4 weeks.

## 2010-03-30 NOTE — Medication Information (Signed)
Summary: [facility]v coumadin - [facility]  Anticoagulant Therapy  Managed by: Eda Keys, PharmD Referring MD: Tonny Bollman, MD PCP: Link Snuffer MD: Jens Som MD, Arlys John Indication 1: Atrial Fibrillation (ICD-427.31) Lab Used: [facility] Site: [facility] INR POC 2.7 INR RANGE 2 - 3  Dietary changes: no    Health status changes: no    Bleeding/hemorrhagic complications: no    Recent/future hospitalizations: no    Any changes in medication regimen? no    Recent/future dental: no  Any missed doses?: no       Is patient compliant with meds? yes      Comments: Plans for pacemaker placement hopefully this month.    Current Medications (verified): 1)  Simvastatin 80 Mg  Tabs (Simvastatin) .... Q Pm 2)  Lisinopril 5 Mg  Tabs (Lisinopril) .... Once Daily 3)  Zolpidem Tartrate 10 Mg  Tabs (Zolpidem Tartrate) .... At Bedtime 4)  Aspirin Ec Low Dose 81 Mg  Tbec (Aspirin) .... Once Daily 5)  Multivitamins   Tabs (Multiple Vitamin) .... Once Daily 6)  Warfarin Sodium 5 Mg  Tabs (Warfarin Sodium) .... As Directed 7)  Vitamin B-12 500 Mcg  Tabs (Cyanocobalamin) .... Once Daily 8)  Desoximetasone 0.05 %  Crea (Desoximetasone) .... Apply Two Times A Day As Needed 9)  Viagra 100 Mg  Tabs (Sildenafil Citrate) .... Take 1/2-1 By Mouth As Directed 10)  Metformin Hcl 1000 Mg Tabs (Metformin Hcl) .Marland Kitchen.. 1 By Mouth Two Times A Day For Diabetes 11)  Testosterone Cypionate 200 Mg/ml  Oil (Testosterone Cypionate) .... 2 Cc Im Monthly 12)  Flomax 0.4 Mg Xr24h-Cap (Tamsulosin Hcl) .... Take 1 Tablet By Mouth Once A Day 13)  Nitroglycerin 0.4 Mg Subl (Nitroglycerin) .... One Tablet Under Tongue Every 5 Minutes As Needed For Chest Pain---May Repeat Times Three 14)  Metoprolol Succinate 100 Mg Xr24h-Tab (Metoprolol Succinate) .... Take Two Tablets By Mouth Once Daily  Allergies (verified): 1)  ! [name]  Anticoagulation Management History:      Positive risk factors for bleeding include presence  of serious comorbidities.  Negative risk factors for bleeding include an age less than 58 years old.  The bleeding index is 'intermediate risk'.  Positive CHADS2 values include History of Diabetes.  Negative CHADS2 values include Age > 8 years old.  The start date was 02/22/2005.  Anticoagulation responsible provider: Jens Som MD, Arlys John.  INR POC: 2.7.  Exp: 03/2010.    Anticoagulation Management Assessment/Plan:      The patient's current anticoagulation dose is Warfarin sodium 5 mg  tabs: as directed.  The target INR is 2 - 3.  The next INR is due 05/19/2009.  Anticoagulation instructions were given to patient.  Results were reviewed/authorized by Eda Keys, PharmD.  He was notified by Eda Keys.         Prior Anticoagulation Instructions: INR 2.1  Continue 1/2 tab = 2.5mg  each day except 1/4 tab = 1.25mg  on Thur  Current Anticoagulation Instructions: INR 2.7  Continue current dosing schedule.  Take 1/2 tablet every day, except take 1/4 tablet on Thursday.  Return to clinic in 4 weeks.    Medication Administration  Injection # 1:    Medication: Testosterone Cypionat 200mg  ing    Diagnosis: HYPOGONADISM, MALE (ICD-257.2)    Route: IM    Site: RUOQ gluteus    Comments: Pt. brought in his own Testerone for injection.     Patient tolerated injection without complications    Given by: Bethena Midget, RN, BSN (  April 21, 2009 11:42 AM)

## 2010-03-30 NOTE — Progress Notes (Signed)
Summary: Skin Lesion  Phone Note Call from Patient   Summary of Call: Pt wants referral to dermatologist to remove something on his skin on his head.  Initial call taken by: Lamar Sprinkles,  June 05, 2007 3:37 PM  Follow-up for Phone Call        OK. If he doesn't have a dermatologist will refer to Dr. Scharlene Gloss office. [facility] notifiied.  Additional Follow-up for Phone Call Additional follow up Details #1::        Lf mess for pt Additional Follow-up by: Lamar Sprinkles,  June 05, 2007 6:53 PM  New Problems: NEOPLASM OF UNCERTAIN BEHAVIOR OF SKIN (ICD-238.2)   New Problems: NEOPLASM OF UNCERTAIN BEHAVIOR OF SKIN (ICD-238.2)

## 2010-03-30 NOTE — Progress Notes (Signed)
  Phone Note Refill Request Message from:  Fax from Pharmacy on March 23, 2009 12:02 PM  Refills Requested: Medication #1:  TESTOSTERONE CYPIONATE 200 MG/ML  OIL 2 cc IM monthly   Last Refilled: 11/06/2008 recieved fax from [facility]. Please Advise refill. Thank you  Initial call taken by: Ami Bullins CMA,  March 23, 2009 12:03 PM  Follow-up for Phone Call        ok to refill as needed  Follow-up by: Jacques Navy MD,  March 23, 2009 1:07 PM    Prescriptions: TESTOSTERONE CYPIONATE 200 MG/ML  OIL (TESTOSTERONE CYPIONATE) 2 cc IM monthly  #10 x 3   Entered by:   Ami Bullins CMA   Authorized by:   Jacques Navy MD   Signed by:   Bill Salinas CMA on 03/23/2009   Method used:   Telephoned to ...       [facility])       [id number] [place], [place]  [address]       Ph: [phone] or [phone]       Fax: [phone]   RxID:   [id number]

## 2010-03-30 NOTE — Assessment & Plan Note (Signed)
Summary: defib check/gdt/amber      Allergies Added:   Visit Type:  Follow-up Primary Provider:  Norins  CC:  pacer check.  History of Present Illness: [name] is seen in followup for aborted cardiac arrest for which he is status post ICD implantation. He is status post PCI with modest depression of LV function. His most recent ECHO cardiogram in October 2010 demonstrated    1. Left ventricle: The cavity size was normal. Wall thickness was        normal. Systolic function was moderately to severely reduced. The        estimated ejection fraction was in the range of 30% to 35%.        Diffuse hypokinesis. 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  A Myoview scan in 2008 demonstrated inferior posterior wall MI but no ischemia.  His device has reached ERI.    The patient has had problems with progressive exercise intolerance over the last couple of years. He denies significant chest discomfort. There've been no problems with peripheral edema. He notes that he has had prior defibrillator discharges, at least one which we know was inappropriate.  I will have to check the records on the others  Current Medications (verified): 1)  Simvastatin 80 Mg  Tabs (Simvastatin) .... Q Pm 2)  Lisinopril 5 Mg  Tabs (Lisinopril) .... Once Daily 3)  Zolpidem Tartrate 10 Mg  Tabs (Zolpidem Tartrate) .... At Bedtime 4)  Aspirin Ec Low Dose 81 Mg  Tbec (Aspirin) .... Once Daily 5)  Multivitamins   Tabs (Multiple Vitamin) .... Once Daily 6)  Warfarin Sodium 5 Mg  Tabs (Warfarin Sodium) .... As Directed 7)  Vitamin B-12 500 Mcg  Tabs (Cyanocobalamin) .... Once Daily 8)  Desoximetasone 0.05 %  Crea (Desoximetasone) .... Apply Two Times A Day As Needed 9)  Viagra 100 Mg  Tabs (Sildenafil Citrate) .... Take 1/2-1 By Mouth As Directed 10)  Metformin Hcl 1000 Mg Tabs (Metformin Hcl) .Marland Kitchen.. 1 By Mouth Two Times A Day For Diabetes 11)  Testosterone Cypionate 200 Mg/ml  Oil  (Testosterone Cypionate) .... 2 Cc Im Monthly 12)  Flomax 0.4 Mg Xr24h-Cap (Tamsulosin Hcl) .... Take 1 Tablet By Mouth Once A Day 13)  Nitroglycerin 0.4 Mg Subl (Nitroglycerin) .... One Tablet Under Tongue Every 5 Minutes As Needed For Chest Pain---May Repeat Times Three 14)  Metoprolol Succinate 100 Mg Xr24h-Tab (Metoprolol Succinate) .... Take Two Tablets By Mouth Once Daily  Allergies (verified): 1)  ! [name]  Past History:  Past Medical History: Last updated: 04/20/2009 TONSILLECTOMY, HX OF (ICD-V45.79) IMPLANTATION OF DEFIBRILLATOR, HX OF (ICD-V45.02)-Guidant Vitality [name] PERCUTANEOUS TRANSLUMINAL CORONARY ANGIOPLASTY, HX OF (ICD-V45.82)  Past Surgical History: Last updated: 04/20/2009 CATARACT EXTRACTION, HX OF (ICD-V45.61) TONSILLECTOMY, HX OF (ICD-V45.79) IMPLANTATION OF DEFIBRILLATOR, HX OF (ICD-V45.02)-Guidant Vitality [name] PERCUTANEOUS TRANSLUMINAL CORONARY ANGIOPLASTY, HX OF (ICD-V45.82)  Family History: Last updated: 11/24/2006 negative - cad, colon ca, prostate ca [name] lipids  Social History: Last updated: 11/24/2006 divorced 1 daughter owner/operator Optician, dispensing business and show production  Risk Factors: Caffeine Use: 3 (11/24/2006)  Risk Factors: Smoking Status: quit (11/24/2006)  Vital Signs:  Patient profile:   65 year old male Height:      72 inches Weight:      190 pounds Pulse rate:   65 / minute BP sitting:   100 / 66  (left arm) Cuff size:   regular  Vitals Entered By: Gavin Pound  Yetta Barre, CNA (April 21, 2009 9:59 AM)  Physical Exam  General:  The patient was alert and oriented in no acute distress. HEENT Normal.  Neck veins were flat, carotids were brisk.  Lungs were clear.  Heart sounds were regular without murmurs or gallops.  Abdomen was soft with active bowel sounds. There is no clubbing cyanosis or edema. Skin Warm and dry, neuro grossly normal affect is normal     ICD Specifications Following MD:  Sherryl Manges, MD      ICD Vendor:  [facility] [employer]     ICD Model Number:  [name]     ICD Serial Number:  [id number] ICD DOI:  11/18/2004     ICD Implanting MD:  Sherryl Manges, MD  Lead 1:    Location: RA     DOI: 11/18/2004     Model #: [id number]     Serial #: [id number]     Status: active Lead 2:    Location: RV     DOI: 11/18/2004     Model #: [id number]     Serial #: [id number]     Status: active  Indications::  ICM; SCD  Explantation Comments: A-fib + coumadin  Episodes Coumadin:  Yes  Brady Parameters Mode DDDR     Lower Rate Limit:  50     Upper Rate Limit 120 PAV 250     Sensed AV Delay:  160  Tachy Zones VF:  240     VT:  220     VT1:  200     Impression & Recommendations:  Problem # 1:  IMPLANTATION OF DEFIBRILLATOR-GUIDANT VITALITY (ICD-V45.02) Device parameters and data were reviewed and no changes were made  His device has reached ERI. He will need to undergo device generator replacement. We discussed potential benefits as well as potential risks including but not limited to lead fracture and infection. He understands these risks.  Given his worsening functional status over the last couple of years since his last Myoview scan and the fact that his ejection fraction is measured 10-15 points lower, I think repeat Myoview scanning in anticipation of the procedure is appropriate.  Problem # 2:  CORONARY ARTERY DISEASE (ICD-414.00)  as above His updated medication list for this problem includes:    Lisinopril 5 Mg Tabs (Lisinopril) ..... Once daily    Aspirin Ec Low Dose 81 Mg Tbec (Aspirin) ..... Once daily    Warfarin Sodium 5 Mg Tabs (Warfarin sodium) .Marland Kitchen... As directed    Nitroglycerin 0.4 Mg Subl (Nitroglycerin) ..... One tablet under tongue every 5 minutes as needed for chest pain---may repeat times three    Metoprolol Succinate 100 Mg Xr24h-tab (Metoprolol succinate) .Marland Kitchen... Take two tablets by mouth once daily  Orders: TLB-BMP (Basic Metabolic Panel-BMET) (80048-METABOL) TLB-CBC Platelet - w/Differential  (85025-CBCD) TLB-PT (Protime) (85610-PTP) TLB-PTT (85730-PTTL) Nuclear Stress Test (Nuc Stress Test)  His updated medication list for this problem includes:    Lisinopril 5 Mg Tabs (Lisinopril) ..... Once daily    Aspirin Ec Low Dose 81 Mg Tbec (Aspirin) ..... Once daily    Warfarin Sodium 5 Mg Tabs (Warfarin sodium) .Marland Kitchen... As directed    Nitroglycerin 0.4 Mg Subl (Nitroglycerin) ..... One tablet under tongue every 5 minutes as needed for chest pain---may repeat times three    Metoprolol Succinate 100 Mg Xr24h-tab (Metoprolol succinate) .Marland Kitchen... Take two tablets by mouth once daily  Problem # 3:  ATRIAL FIBRILLATION (ICD-427.31) no obvious atrial fibrillation intercurrently. I will discuss with his other physicians  role of discontinuing his aspirin His updated medication list for this problem includes:    Aspirin Ec Low Dose 81 Mg Tbec (Aspirin) ..... Once daily    Warfarin Sodium 5 Mg Tabs (Warfarin sodium) .Marland Kitchen... As directed    Metoprolol Succinate 100 Mg Xr24h-tab (Metoprolol succinate) .Marland Kitchen... 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 Once daily    Aspirin Ec Low Dose 81 Mg Tbec (Aspirin) ..... Once daily    Warfarin Sodium 5 Mg Tabs (Warfarin sodium) .Marland Kitchen... As directed    Nitroglycerin 0.4 Mg Subl (Nitroglycerin) ..... One tablet under tongue every 5 minutes as needed for chest pain---may repeat times three    Metoprolol Succinate 100 Mg Xr24h-tab (Metoprolol succinate) .Marland Kitchen... Take two tablets by mouth once daily  Orders: Nuclear Stress Test (Nuc Stress Test)  His updated medication list for this problem includes:    Lisinopril 5 Mg Tabs (Lisinopril) ..... Once daily    Aspirin Ec Low Dose 81 Mg Tbec (Aspirin) ..... Once daily    Warfarin Sodium 5 Mg Tabs (Warfarin sodium) .Marland Kitchen... As  directed    Nitroglycerin 0.4 Mg Subl (Nitroglycerin) ..... One tablet under tongue every 5 minutes as needed for chest pain---may repeat times three    Metoprolol Succinate 100 Mg Xr24h-tab (Metoprolol succinate) .Marland Kitchen... Take two tablets by mouth once daily  Patient Instructions: 1)  Your physician has requested that you have an adenosine myoview.  For further information please visit [email or web address]  Please follow instruction sheet, as given. NEEDS TO BE DONE PRIOR TO HIS GENERATOR CHANGE ON MONDAY.  2)  You need an ICD Generator Change. Please see instruction sheet given to you today.  3)  Your physician recommends that you HAVE LABS TODAY: CBC, BMET, PT, PTT

## 2010-03-30 NOTE — Assessment & Plan Note (Signed)
Summary: defib check.mdt.amber  Medications Added FLOMAX 0.4 MG XR24H-CAP (TAMSULOSIN HCL) take one capsule each day when needed METOPROLOL SUCCINATE 100 MG XR24H-TAB (METOPROLOL SUCCINATE) Take two tablets by mouth once daily        Primary Provider:  Norins  CC:  pt out of energy and his legs feel like they have no circulation.  Marland Kitchen  History of Present Illness: [name] is seen in followup for aborted cardiac arrest for which he is status post ICD implantation. He is status post PCI with [name] depression of LV function. His most recent ECHO cardiogram in October 2010 demonstrated    1. Left ventricle: The cavity size was normal. Wall thickness was        normal. Systolic function was moderately to severely reduced. The        estimated ejection fraction was in the range of 30% to 35%.        Diffuse hypokinesis. 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  A Myoview scan in 2008 demonstrated inferior posterior wall MI but no ischemia.  He has permanent atrial fibrillation.    He is s/p recent device generator replacement complicated by a little oozing and then hematoma formation.  This is resolving  Allergies: 1)  ! [name]  Past History:  Past Medical History: Last updated: 05/12/2009 IMPLANTATION OF DEFIBRILLATOR, HX OF (ICD-V45.02)-Guidant Vitality [name]-Now explanted Explantation of Guidant Vitality Implant of Medtronic Virtuoso [id number] PERCUTANEOUS TRANSLUMINAL CORONARY ANGIOPLASTY, HX OF (ICD-V45.82)  Past Surgical History: Last updated: 04/20/2009 CATARACT EXTRACTION, HX OF (ICD-V45.61) TONSILLECTOMY, HX OF (ICD-V45.79) IMPLANTATION OF DEFIBRILLATOR, HX OF (ICD-V45.02)-Guidant Vitality [name] PERCUTANEOUS TRANSLUMINAL CORONARY ANGIOPLASTY, HX OF (ICD-V45.82)  Family History: Last updated: 11/24/2006 negative - cad, colon ca, prostate ca [name] lipids  Social History: Last updated: 11/24/2006 divorced 1  daughter owner/operator Optician, dispensing business and show production  Vital Signs:  Patient profile:   65 year old male Height:      72 inches Weight:      189 pounds BMI:     25.73 Pulse rate:   68 / minute Pulse rhythm:   regular BP sitting:   100 / 68  (left arm) Cuff size:   regular  Vitals Entered By: [name] CMA (May 13, 2009 8:44 AM)  Physical Exam  General:  The patient was alert and oriented in no acute distress. HEENT Normal.   Hematoma soft and with greenish yellow discoloration Lungs were clear.  Heart sounds were regular without murmurs or gallops.  Abdomen was soft with active bowel sounds. There is no clubbing cyanosis or edema. Skin Warm and dry     ICD Specifications Following MD:  [name] Manges, MD     ICD Vendor:  Medtronic     ICD Model Number:  [id number]     ICD Serial Number:  [id number] ICD DOI:  04/27/2009     ICD Implanting MD:  [name] Manges, MD  Lead 1:    Location: RA     DOI: 11/18/2004     Model #: [id number]     Serial #: [id number]     Status: active Lead 2:    Location: RV     DOI: 11/18/2004     Model #: 6213     Serial #: [id number]     Status: active  Indications::  ICM; SCD A-fib + coumadin  Explantation Comments: 04/27/2009 [employer] [name]/117793 explanted  ICD Follow Up Remote Check?  No Battery Voltage:  3.17 V     Underlying rhythm:  AFIB ICD Dependent:  No       ICD Device Measurements Right Ventricle:  Amplitude: 20 mV, Impedance: 494 ohms, Threshold: 0.75 V at 0.4 msec Shock Impedance: 52/76 ohms   Episodes Percent Mode Switch:  100%     Coumadin:  Yes Shock:  0     ATP:  0     Nonsustained:  0     Ventricular Pacing:  19.4%  Brady Parameters Mode DDI     Lower Rate Limit:  60     Upper Rate Limit 100 PAV 180     Sensed AV Delay:  150  Tachy Zones VF:  222     VT:  OFF     VT1:  OFF     Tech Comments:  Pt still with moderate hematoma.  T wave oversensing issue resolved, see previous notes. [name] Balsam RN BSN  May 13, 2009 9:07 AM   Impression & Recommendations:  Problem # 1:  T WAVE OVERSENSING (ICD-996.04) This was programmed around by V blanking after pacing, which was the only trigger.    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

## 2010-03-30 NOTE — Cardiovascular Report (Signed)
Summary: Office Visit   Office Visit   Imported By: Roderic Ovens 05/15/2009 16:14:32  _____________________________________________________________________  External Attachment:    Type:   Image     Comment:   External Document

## 2010-03-30 NOTE — Progress Notes (Signed)
  Phone Note Refill Request   Refills Requested: Medication #1:  METOPROLOL SUCCINATE 100 MG XR24H-TAB Take two tablets by mouth once daily.   Supply Requested: 1 month  Medication #2:  FLOMAX 0.4 MG XR24H-CAP take one capsule each day when needed   Supply Requested: 1 month  Medication #3:  ZOLPIDEM TARTRATE 10 MG  TABS at bedtime   Supply Requested: 1 month   Is it ok to refill ambien and the above meds  Initial call taken by: Rock Nephew CMA,  August 12, 2009 11:28 AM  Follow-up for Phone Call        OK for as needed refills Follow-up by: Jacques Navy MD,  August 12, 2009 12:18 PM    Prescriptions: ZOLPIDEM TARTRATE 10 MG  TABS (ZOLPIDEM TARTRATE) at bedtime  #30 x 1   Entered by:   Ami Bullins CMA   Authorized by:   Jacques Navy MD   Signed by:   Bill Salinas CMA on 08/13/2009   Method used:   Telephoned to ...       [facility] (retail)       [address] [place], [place]  [address]       Ph: [phone] or [phone]       Fax: [phone]   RxID:   [id number] METOPROLOL SUCCINATE 100 MG XR24H-TAB (METOPROLOL SUCCINATE) Take two tablets by mouth once daily  #60 x 5   Entered by:   Ami Bullins CMA   Authorized by:   Jacques Navy MD   Signed by:   Bill Salinas CMA on 08/13/2009   Method used:   Electronically to        [facility]* (retail)       [address] [place], [place]  [address]       Ph: [phone] or [phone]       Fax: [phone]   RxID:   [id number] FLOMAX 0.4 MG XR24H-CAP (TAMSULOSIN HCL) take one capsule each day when needed  #30 x 6   Entered by:   Ami Bullins CMA   Authorized by:   Jacques Navy MD   Signed by:   Bill Salinas CMA on 08/13/2009   Method used:   Electronically to        [facility]* (retail)       [address] [place], [place]  [address]       Ph: [phone] or [phone]       Fax: [phone]   RxID:   [id number]

## 2010-03-30 NOTE — Assessment & Plan Note (Signed)
Summary: sneezing,sore throat,upper chest congestion/cd   Vital Signs:  Patient profile:   65 year old male Height:      72 inches Weight:      192 pounds BMI:     26.13 O2 Sat:      95 % on Room air Temp:     97.5 degrees F oral Pulse rate:   65 / minute BP sitting:   98 / 72  (left arm) Cuff size:   regular  Vitals Entered By: Ami Bullins CMA (March 02, 2009 3:22 PM)  O2 Flow:  Room air CC: pt c/o cough (producing green sputum) with head and chest congestion x 3 days/ pt's last tetanus shot was in the 1970's/ ab   Primary Care Provider:  Norins  CC:  pt c/o cough (producing green sputum) with head and chest congestion x 3 days/ pt's last tetanus shot was in the 1970's/ ab.  History of Present Illness: Patient presents with a several day history or sinus congestion, mild cough. He denies fever, chills, productive sputum, SOB, otalgia, ST. He has not tried OTC meds.  Current Medications (verified): 1)  Simvastatin 80 Mg  Tabs (Simvastatin) .... Q Pm 2)  Lisinopril 5 Mg  Tabs (Lisinopril) .... Once Daily 3)  Zolpidem Tartrate 10 Mg  Tabs (Zolpidem Tartrate) .... At Bedtime 4)  Aspirin Ec Low Dose 81 Mg  Tbec (Aspirin) .... Once Daily 5)  Multivitamins   Tabs (Multiple Vitamin) .... Once Daily 6)  Warfarin Sodium 5 Mg  Tabs (Warfarin Sodium) .... As Directed 7)  Vitamin B-12 500 Mcg  Tabs (Cyanocobalamin) .... Once Daily 8)  Desoximetasone 0.05 %  Crea (Desoximetasone) .... Apply Two Times A Day As Needed 9)  Viagra 100 Mg  Tabs (Sildenafil Citrate) .... Take 1/2-1 By Mouth As Directed 10)  Metformin Hcl 1000 Mg Tabs (Metformin Hcl) .Marland Kitchen.. 1 By Mouth Two Times A Day For Diabetes 11)  Testosterone Cypionate 200 Mg/ml  Oil (Testosterone Cypionate) .... 2 Cc Im Monthly 12)  Flomax 0.4 Mg Xr24h-Cap (Tamsulosin Hcl) .... Take 1 Tablet By Mouth Once A Day 13)  Nitroglycerin 0.4 Mg Subl (Nitroglycerin) .... One Tablet Under Tongue Every 5 Minutes As Needed For Chest Pain---May Repeat  Times Three 14)  Metoprolol Succinate 100 Mg Xr24h-Tab (Metoprolol Succinate) .... Take Two Tablets By Mouth Once Daily  Allergies (verified): 1)  ! [name]  Past History:  Past Medical History: Last updated: 09/22/2008 TONSILLECTOMY, HX OF (ICD-V45.79) IMPLANTATION OF DEFIBRILLATOR, HX OF (ICD-V45.02) PERCUTANEOUS TRANSLUMINAL CORONARY ANGIOPLASTY, HX OF (ICD-V45.82)  Past Surgical History: Last updated: 09/22/2008 CATARACT EXTRACTION, HX OF (ICD-V45.61) TONSILLECTOMY, HX OF (ICD-V45.79) IMPLANTATION OF DEFIBRILLATOR, HX OF (ICD-V45.02) PERCUTANEOUS TRANSLUMINAL CORONARY ANGIOPLASTY, HX OF (ICD-V45.82) PSH reviewed for relevance, FH reviewed for relevance  Review of Systems  The patient denies anorexia, fever, weight loss, hoarseness, dyspnea on exertion, headaches, and severe indigestion/heartburn.    Physical Exam  General:  Well-developed,well-nourished,in no acute distress; alert,appropriate and cooperative throughout examination Head:  normocephalic and atraumatic.   Eyes:  corneas and lenses clear and no injection.   Ears:  R ear normal and L ear normal.   Mouth:  throat clear Neck:  full ROM.   Lungs:  Normal respiratory effort, chest expands symmetrically. Lungs are clear to auscultation, no crackles or wheezes. Heart:  normal rate and regular rhythm.   Abdomen:  Bowel sounds positive,abdomen soft and non-tender without masses, organomegaly or hernias noted.   Impression & Recommendations:  Problem #  1:  URI (ICD-465.9) No evidence of bacterial infection - suspect viral URI  Plan - supportive care.   His updated medication list for this problem includes:    Aspirin Ec Low Dose 81 Mg Tbec (Aspirin) ..... Once daily  Complete Medication List: 1)  Simvastatin 80 Mg Tabs (Simvastatin) .... Q pm 2)  Lisinopril 5 Mg Tabs (Lisinopril) .... Once daily 3)  Zolpidem Tartrate 10 Mg Tabs (Zolpidem tartrate) .... At bedtime 4)  Aspirin Ec Low Dose 81 Mg Tbec  (Aspirin) .... Once daily 5)  Multivitamins Tabs (Multiple vitamin) .... Once daily 6)  Warfarin Sodium 5 Mg Tabs (Warfarin sodium) .... As directed 7)  Vitamin B-12 500 Mcg Tabs (Cyanocobalamin) .... Once daily 8)  Desoximetasone 0.05 % Crea (Desoximetasone) .... Apply two times a day as needed 9)  Viagra 100 Mg Tabs (Sildenafil citrate) .... Take 1/2-1 by mouth as directed 10)  Metformin Hcl 1000 Mg Tabs (Metformin hcl) .Marland Kitchen.. 1 by mouth two times a day for diabetes 11)  Testosterone Cypionate 200 Mg/ml Oil (Testosterone cypionate) .... 2 cc im monthly 12)  Flomax 0.4 Mg Xr24h-cap (Tamsulosin hcl) .... Take 1 tablet by mouth once a day 13)  Nitroglycerin 0.4 Mg Subl (Nitroglycerin) .... One tablet under tongue every 5 minutes as needed for chest pain---may repeat times three 14)  Metoprolol Succinate 100 Mg Xr24h-tab (Metoprolol succinate) .... Take two tablets by mouth once daily

## 2010-03-30 NOTE — Miscellaneous (Signed)
Summary: Device change out  Clinical Lists Changes  Observations: Added new observation of ICD IMPL DTE: 04/27/2009 (04/29/2009 13:57) Added new observation of ICD SERL#: [id number] (04/29/2009 13:57) Added new observation of ICD MODL#: D274DRG (04/29/2009 56:21) Added new observation of ICDMANUFACTR: Medtronic (04/29/2009 13:57) Added new observation of ICDEXPLCOMM: 04/27/2009 [employer] [name]/117793 explanted (04/29/2009 13:57) Added new observation of ICD INDICATN: ICM; SCD A-fib + coumadin (04/29/2009 13:57)       ICD Specifications Following MD:  Sherryl Manges, MD     ICD Vendor:  Medtronic     ICD Model Number:  [id number]     ICD Serial Number:  [id number] ICD DOI:  04/27/2009     ICD Implanting MD:  Sherryl Manges, MD  Lead 1:    Location: RA     DOI: 11/18/2004     Model #: [id number]     Serial #: [id number]     Status: active Lead 2:    Location: RV     DOI: 11/18/2004     Model #: [id number]     Serial #: [id number]     Status: active  Indications::  ICM; SCD A-fib + coumadin  Explantation Comments: 04/27/2009 [employer] [name]/117793 explanted  Episodes Coumadin:  Yes  Brady Parameters Mode DDDR     Lower Rate Limit:  50     Upper Rate Limit 120 PAV 250     Sensed AV Delay:  160  Tachy Zones VF:  240     VT:  220     VT1:  200

## 2010-03-30 NOTE — Cardiovascular Report (Signed)
Summary: Pre Op Orders  Pre Op Orders   Imported By: Roderic Ovens 05/11/2009 14:29:17  _____________________________________________________________________  External Attachment:    Type:   Image     Comment:   External Document

## 2010-03-30 NOTE — Medication Information (Signed)
Summary: rov/eac  Anticoagulant Therapy  Managed by: Weston Brass, PharmD Referring MD: Tonny Bollman, MD PCP: Link Snuffer MD: Gala Romney MD, Reuel Boom Indication 1: Atrial Fibrillation (ICD-427.31) Lab Used: [facility] Site: [facility] INR POC 2.0 INR RANGE 2 - 3  Dietary changes: no    Health status changes: no    Bleeding/hemorrhagic complications: no    Recent/future hospitalizations: no    Any changes in medication regimen? no    Recent/future dental: no  Any missed doses?: yes     Details: may have missed a dose but unsure   Is patient compliant with meds? yes       Allergies: 1)  ! [name]  Anticoagulation Management History:      The patient is taking warfarin and comes in today for a routine follow up visit.  Positive risk factors for bleeding include presence of serious comorbidities.  Negative risk factors for bleeding include an age less than 25 years old.  The bleeding index is 'intermediate risk'.  Positive CHADS2 values include History of Diabetes.  Negative CHADS2 values include Age > 15 years old.  The start date was 02/22/2005.  His last INR was 2.8 ratio.  Anticoagulation responsible provider: Traeh Milroy MD, Reuel Boom.  INR POC: 2.0.  Cuvette Lot#: [id number].  Exp: 06/2010.    Anticoagulation Management Assessment/Plan:      The patient's current anticoagulation dose is Warfarin sodium 5 mg  tabs: as directed.  The target INR is 2 - 3.  The next INR is due 07/07/2009.  Anticoagulation instructions were given to patient.  Results were reviewed/authorized by Weston Brass, PharmD.  He was notified by Weston Brass PharmD.         Prior Anticoagulation Instructions: INR 2.0 Resume 2.5mg s daily except 1.25mg s on Thursdays. Recheck in 4 weeks.   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 bring in his Testosterone for his monthly injections.     Patient tolerated injection without complications    Given by: Gwenyth Ober, RN

## 2010-03-30 NOTE — Progress Notes (Signed)
    Immunization History:  Influenza Immunization History:    Influenza:  0.78ml right deltoid mfg. sanofi pasteur lot [id number] (12/04/2009)

## 2010-03-30 NOTE — Medication Information (Signed)
Summary: rov/tm  Anticoagulant Therapy  Managed by: Cloyde Reams, RN, BSN Referring MD: Tonny Bollman, MD PCP: Link Snuffer MD: Jens Som MD, Arlys John Indication 1: Atrial Fibrillation (ICD-427.31) Lab Used: [facility] Site: [facility] INR POC 2.6 INR RANGE 2 - 3  Dietary changes: no    Health status changes: no    Bleeding/hemorrhagic complications: no    Recent/future hospitalizations: no    Any changes in medication regimen? yes       Details: Stopped Testosterone gel.   Recent/future dental: no  Any missed doses?: no       Is patient compliant with meds? yes       Allergies: 1)  ! [name]  Anticoagulation Management History:      The patient is taking warfarin and comes in today for a routine follow up visit.  Positive risk factors for bleeding include presence of serious comorbidities.  Negative risk factors for bleeding include an age less than 59 years old.  The bleeding index is 'intermediate risk'.  Positive CHADS2 values include History of Diabetes.  Negative CHADS2 values include Age > 15 years old.  The start date was 02/22/2005.  His last INR was 2.8 ratio.  Anticoagulation responsible provider: Jens Som MD, Arlys John.  INR POC: 2.6.  Cuvette Lot#: [id number].  Exp: 11/2010.    Anticoagulation Management Assessment/Plan:      The patient's current anticoagulation dose is Warfarin sodium 5 mg  tabs: as directed.  The target INR is 2 - 3.  The next INR is due 11/03/2009.  Anticoagulation instructions were given to patient.  Results were reviewed/authorized by Cloyde Reams, RN, BSN.  He was notified by Cloyde Reams RN.         Prior Anticoagulation Instructions: INR 2.7 Continue 2.5mg s everyday except 1.25mg s on Thursdays. Recheck in 4 weeks.   Current Anticoagulation Instructions: INR 2.6  Continue on same dosage 2.5mg  daily except 1.25mg  on Thursdays.  Recheck in 4 weeks.

## 2010-04-01 NOTE — Medication Information (Signed)
Summary: rov/tm  Anticoagulant Therapy  Managed by: Bethena Midget, RN, BSN Referring MD: Tonny Bollman, MD PCP: Link Snuffer MD: Graciela Husbands MD, Viviann Spare Indication 1: Atrial Fibrillation (ICD-427.31) Lab Used: [facility] Site: [facility] INR POC 1.6 INR RANGE 2 - 3  Dietary changes: no    Health status changes: no    Bleeding/hemorrhagic complications: no    Recent/future hospitalizations: no    Any changes in medication regimen? no    Recent/future dental: no  Any missed doses?: no       Is patient compliant with meds? yes       Allergies: 1)  ! [name]  Anticoagulation Management History:      The patient is taking warfarin and comes in today for a routine follow up visit.  Positive risk factors for bleeding include presence of serious comorbidities.  Negative risk factors for bleeding include an age less than 38 years old.  The bleeding index is 'intermediate risk'.  Positive CHADS2 values include History of CHF and History of Diabetes.  Negative CHADS2 values include Age > 83 years old.  The start date was 02/22/2005.  His last INR was 2.8 ratio.  Anticoagulation responsible provider: Graciela Husbands MD, Viviann Spare.  INR POC: 1.6.  Cuvette Lot#: [id number].  Exp: 03/2011.    Anticoagulation Management Assessment/Plan:      The patient's current anticoagulation dose is Warfarin sodium 5 mg  tabs: as directed.  The target INR is 2 - 3.  The next INR is due 04/06/2010.  Anticoagulation instructions were given to patient.  Results were reviewed/authorized by Bethena Midget, RN, BSN.  He was notified by Bethena Midget, RN, BSN.         Prior Anticoagulation Instructions: INR 1.8 Today take 3.75mg s then resume 2.5mg s daily except 1.25mg  on Thursdays. Recheck in 4 weeks.   Current Anticoagulation Instructions: INR 1.6 Today take extra 1/4 pill then change dose to 1/2 pill everyday. Recheck in 2 weeks.

## 2010-04-01 NOTE — Letter (Signed)
Summary: Remote Device Check  [employer], Main Office  [address]   [place], [place] [address]   Phone: [phone]  Fax: [phone]     March 09, 2010 MRN: [id number]   ZIYAN SCHOON [address] [place], [place]  [address]   Dear Mr. Fulcher,   Your remote transmission was recieved and reviewed by your physician.  All diagnostics were within normal limits for you.  __X___Your next transmission is scheduled for:   05-06-10.  Please transmit at any time this day.  If you have a wireless device your transmission will be sent automatically.    Sincerely,  Vella Kohler

## 2010-04-01 NOTE — Cardiovascular Report (Signed)
Summary: Office Visit Remote   Office Visit Remote   Imported By: Roderic Ovens 03/12/2010 12:01:16  _____________________________________________________________________  External Attachment:    Type:   Image     Comment:   External Document

## 2010-04-01 NOTE — Medication Information (Signed)
Summary: rov/sp  Anticoagulant Therapy  Managed by: Bethena Midget, RN, BSN Referring MD: Tonny Bollman, MD PCP: Link Snuffer MD: Myrtis Ser MD, Tinnie Gens Indication 1: Atrial Fibrillation (ICD-427.31) Lab Used: [facility] Site: [facility] INR POC 1.8 INR RANGE 2 - 3  Dietary changes: yes       Details: Ate more leafy veggies   Health status changes: no    Bleeding/hemorrhagic complications: no    Recent/future hospitalizations: no    Any changes in medication regimen? no    Recent/future dental: no  Any missed doses?: no       Is patient compliant with meds? yes       Allergies: 1)  ! [name]  Anticoagulation Management History:      The patient is taking warfarin and comes in today for a routine follow up visit.  Positive risk factors for bleeding include presence of serious comorbidities.  Negative risk factors for bleeding include an age less than 13 years old.  The bleeding index is 'intermediate risk'.  Positive CHADS2 values include History of CHF and History of Diabetes.  Negative CHADS2 values include Age > 56 years old.  The start date was 02/22/2005.  His last INR was 2.8 ratio.  Anticoagulation responsible provider: Myrtis Ser MD, Tinnie Gens.  INR POC: 1.8.  Cuvette Lot#: [id number].  Exp: 04/2011.    Anticoagulation Management Assessment/Plan:      The patient's current anticoagulation dose is Warfarin sodium 5 mg  tabs: as directed.  The target INR is 2 - 3.  The next INR is due 03/23/2010.  Anticoagulation instructions were given to patient.  Results were reviewed/authorized by Bethena Midget, RN, BSN.  He was notified by Bethena Midget, RN, BSN.         Prior Anticoagulation Instructions: INR 2.2  Continue same dose of 1/2 tablet every day except 1/4 tablet on Tuesday.  Recheck INR in 4 weeks.   Current Anticoagulation Instructions: INR 1.8 Today take 3.75mg s then resume 2.5mg s daily except 1.25mg  on Thursdays. Recheck in 4 weeks.

## 2010-04-07 ENCOUNTER — Encounter: Payer: Self-pay | Admitting: Internal Medicine

## 2010-04-07 ENCOUNTER — Encounter (INDEPENDENT_AMBULATORY_CARE_PROVIDER_SITE_OTHER): Payer: BC Managed Care – PPO

## 2010-04-07 DIAGNOSIS — I4891 Unspecified atrial fibrillation: Secondary | ICD-10-CM

## 2010-04-07 DIAGNOSIS — Z7901 Long term (current) use of anticoagulants: Secondary | ICD-10-CM

## 2010-04-07 LAB — CONVERTED CEMR LAB: POC INR: 1.8

## 2010-04-15 NOTE — Medication Information (Signed)
Summary: [facility]  Anticoagulant Therapy  Managed by: Bethena Midget, RN, BSN Referring MD: Tonny Bollman, MD PCP: Link Snuffer MD: Graciela Husbands MD, Viviann Spare Indication 1: Atrial Fibrillation (ICD-427.31) Lab Used: [facility] Site: [facility] INR POC 1.8 INR RANGE 2 - 3  Dietary changes: no    Health status changes: no    Bleeding/hemorrhagic complications: no    Recent/future hospitalizations: no    Any changes in medication regimen? no    Recent/future dental: no  Any missed doses?: yes     Details: missed yesterdays dose  Is patient compliant with meds? yes       Allergies: 1)  ! [name]  Anticoagulation Management History:      The patient is taking warfarin and comes in today for a routine follow up visit.  Positive risk factors for bleeding include presence of serious comorbidities.  Negative risk factors for bleeding include an age less than 64 years old.  The bleeding index is 'intermediate risk'.  Positive CHADS2 values include History of CHF and History of Diabetes.  Negative CHADS2 values include Age > 1 years old.  The start date was 02/22/2005.  His last INR was 2.8 ratio.  Anticoagulation responsible provider: Graciela Husbands MD, Viviann Spare.  INR POC: 1.8.  Cuvette Lot#: [id number].  Exp: 03/2011.    Anticoagulation Management Assessment/Plan:      The patient's current anticoagulation dose is Warfarin sodium 5 mg  tabs: as directed.  The target INR is 2 - 3.  The next INR is due 04/27/2010.  Anticoagulation instructions were given to patient.  Results were reviewed/authorized by Bethena Midget, RN, BSN.  He was notified by Bethena Midget, RN, BSN.         Prior Anticoagulation Instructions: INR 1.6 Today take extra 1/4 pill then change dose to 1/2 pill everyday. Recheck in 2 weeks.   Current Anticoagulation Instructions: INR 1.8 Change dose to 1/2 pill  everyday except 1 pill  on Wednesdays. Recheck in 3  weeks

## 2010-04-25 DIAGNOSIS — Z7901 Long term (current) use of anticoagulants: Secondary | ICD-10-CM

## 2010-04-25 DIAGNOSIS — I4891 Unspecified atrial fibrillation: Secondary | ICD-10-CM

## 2010-04-25 HISTORY — DX: Long term (current) use of anticoagulants: Z79.01

## 2010-04-27 ENCOUNTER — Encounter: Payer: Self-pay | Admitting: Cardiology

## 2010-04-27 ENCOUNTER — Encounter (INDEPENDENT_AMBULATORY_CARE_PROVIDER_SITE_OTHER): Payer: BC Managed Care – PPO

## 2010-04-27 DIAGNOSIS — I4891 Unspecified atrial fibrillation: Secondary | ICD-10-CM

## 2010-04-27 DIAGNOSIS — Z7901 Long term (current) use of anticoagulants: Secondary | ICD-10-CM

## 2010-04-27 LAB — CONVERTED CEMR LAB: POC INR: 1.7

## 2010-05-06 NOTE — Medication Information (Signed)
Summary: rov/pc   Anticoagulant Therapy  Managed by: Windell Hummingbird, RN Referring MD: Tonny Bollman, MD PCP: Link Snuffer MD: Shirlee Latch MD, Kenady Doxtater Indication 1: Atrial Fibrillation (ICD-427.31) Lab Used: [facility] Site: [facility] INR POC 1.7 INR RANGE 2 - 3  Dietary changes: no    Health status changes: no    Bleeding/hemorrhagic complications: no    Recent/future hospitalizations: no    Any changes in medication regimen? no    Recent/future dental: no  Any missed doses?: yes     Details: Missed 1/2 tablet on Wednesday.  Is patient compliant with meds? yes       Allergies: 1)  ! [name]  Anticoagulation Management History:      The patient is taking warfarin and comes in today for a routine follow up visit.  Positive risk factors for bleeding include presence of serious comorbidities.  Negative risk factors for bleeding include an age less than 20 years old.  The bleeding index is 'intermediate risk'.  Positive CHADS2 values include History of CHF and History of Diabetes.  Negative CHADS2 values include Age > 26 years old.  The start date was 02/22/2005.  His last INR was 2.8 ratio.  Anticoagulation responsible provider: Shirlee Latch MD, Derya Dettmann.  INR POC: 1.7.  Cuvette Lot#: [id number].  Exp: 03/2011.    Anticoagulation Management Assessment/Plan:      The patient's current anticoagulation dose is Warfarin sodium 5 mg  tabs: as directed.  The target INR is 2 - 3.  The next INR is due 05/11/2010.  Anticoagulation instructions were given to patient.  Results were reviewed/authorized by Windell Hummingbird, RN.  He was notified by Windell Hummingbird, RN.         Prior Anticoagulation Instructions: INR 1.8 Change dose to 1/2 pill  everyday except 1 pill  on Wednesdays. Recheck in 3  weeks  Current Anticoagulation Instructions: INR 1.7 Take 1 tablet today (total 5 mg).  Then resume taking 1/2 tablet every day, except take 1 tablet on Wednesdays. Recheck in 2 weeks.

## 2010-05-19 LAB — BASIC METABOLIC PANEL
BUN: 12 mg/dL (ref 6–23)
Chloride: 98 mEq/L (ref 96–112)
Creatinine, Ser: 1.08 mg/dL (ref 0.4–1.5)
Glucose, Bld: 168 mg/dL — ABNORMAL HIGH (ref 70–99)

## 2010-05-19 LAB — GLUCOSE, CAPILLARY: Glucose-Capillary: 155 mg/dL — ABNORMAL HIGH (ref 70–99)

## 2010-05-25 ENCOUNTER — Telehealth: Payer: Self-pay | Admitting: Internal Medicine

## 2010-05-25 ENCOUNTER — Other Ambulatory Visit: Payer: Self-pay | Admitting: Internal Medicine

## 2010-05-26 ENCOUNTER — Telehealth: Payer: Self-pay | Admitting: *Deleted

## 2010-05-26 ENCOUNTER — Other Ambulatory Visit: Payer: Self-pay | Admitting: Internal Medicine

## 2010-05-26 MED ORDER — LISINOPRIL 5 MG PO TABS: 5.0000 mg | ORAL_TABLET | Freq: Every day | ORAL | Status: DC

## 2010-05-26 NOTE — Telephone Encounter (Signed)
Received fax from [facility] Zolpidem Tartrate 10 mg take one tablet at bedtime prn for sleep

## 2010-05-26 NOTE — Telephone Encounter (Signed)
May refill prn

## 2010-05-27 MED ORDER — ZOLPIDEM TARTRATE 10 MG PO TABS: 10.0000 mg | ORAL_TABLET | Freq: Every evening | ORAL | Status: DC | PRN

## 2010-06-08 ENCOUNTER — Ambulatory Visit (INDEPENDENT_AMBULATORY_CARE_PROVIDER_SITE_OTHER): Payer: BC Managed Care – PPO | Admitting: *Deleted

## 2010-06-08 DIAGNOSIS — I4891 Unspecified atrial fibrillation: Secondary | ICD-10-CM

## 2010-06-08 DIAGNOSIS — Z7901 Long term (current) use of anticoagulants: Secondary | ICD-10-CM

## 2010-06-28 ENCOUNTER — Other Ambulatory Visit: Payer: Self-pay | Admitting: Internal Medicine

## 2010-07-06 ENCOUNTER — Ambulatory Visit (INDEPENDENT_AMBULATORY_CARE_PROVIDER_SITE_OTHER): Payer: BC Managed Care – PPO | Admitting: *Deleted

## 2010-07-06 DIAGNOSIS — Z7901 Long term (current) use of anticoagulants: Secondary | ICD-10-CM

## 2010-07-06 DIAGNOSIS — I4891 Unspecified atrial fibrillation: Secondary | ICD-10-CM

## 2010-07-06 LAB — POCT INR: INR: 3.2

## 2010-07-06 MED ORDER — WARFARIN SODIUM 2.5 MG PO TABS: ORAL_TABLET | ORAL | Status: DC

## 2010-07-13 NOTE — Assessment & Plan Note (Signed)
[facility]                         ELECTROPHYSIOLOGY OFFICE NOTE   NAME:[name]                        MRN:          [id number]  DATE:09/05/2007                            DOB:          Nov 11, 1945    HISTORY OF PRESENT ILLNESS:  [name] was seen following an ICD  implanted elsewhere for an aborted sudden cardiac death.  He has  ischemic heart disease.   He had an ICD discharge related to rapid atrial fibrillation.  He was  seen by Dr. Excell Seltzer shortly thereafter and his atenolol was up-titrated  from 50 to 100 mg a day.   He has ongoing problems with atrial fibrillation.   MEDICATIONS:  1. Coumadin.  2. Simvastatin.  3. Lisinopril 50  4. Aspirin.  5. Metformin 500 b.i.d.   PHYSICAL EXAMINATION:  VITAL SIGNS:  His blood pressure was 101/66 with  a pulse of 80.  His weight is 204with heavy boots on.  LUNGS:  Clear.  HEART:  Sounds were irregular.  EXTREMITIES:  Without edema.   STUDIES:  Interrogation of his [facility] scientific defibrillator  demonstrated a fibrillation of 3.4 with an impedance of 518 and the R-  wave was 25  with impedance of 585, the threshold was 1 volt at 0.5,  battery voltage 2.62.  High-voltage impedance is 48 ohms.   IMPRESSION:  1. Aborted sudden cardiac death.  2. Status post implantable cardioverter-defibrillator for the above.  3. Inappropriate therapy for atrial fibrillation with subsequent up-      titration of atenolol.  4. Mild bradycardia.  5. Thromboembolic risk factors notable for,      a.     Hypertension.      b.     Depressed left ventricular function.      c.     Diabetes.  6. Impending loss of insurance in June 2010.   [name] has recurrent atrial fibrillation.  He is in atrial  fibrillation again today following his cardioversion. He has ICD.  I  think that this is going to be a persistent issue.  Rate control I think  will be an appropriate strategy at this point.  An up-titration of  atenolol seems to be controlling the rapid rate.   He is concerned about his insurance and defibrillator generator  replacement.  We will have to be cognizant of that as we go forward.     Duke Salvia, MD, [facility]  Electronically Signed    SCK/MedQ  DD: 09/05/2007  DT: 09/06/2007  Job #: [id number]

## 2010-07-13 NOTE — Assessment & Plan Note (Signed)
[facility]                            CARDIOLOGY OFFICE NOTE   NAME:[name], [name]                        MRN:          [id number]  DATE:01/08/2007                            DOB:          1945-03-22    [name] was seen in followup at the [facility] office on  January 08, 2007.  [name] is a 65 year old gentleman with coronary  artery disease, ischemic cardiomyopathy, and aborted sudden cardiac  death.  He is status post ICD placement and revascularization with  multiple drug-eluting stents, all performed at [facility] approximately 2 years ago.   From a symptomatic standpoint, [name] has recently been experiencing  some chest pain.  He describes a few separate episodes.  One episode  occurred at rest and involved a fleeting, sharp pain across the chest.  The episode of pain only lasted for a few seconds and had no associated  symptoms.  He describes another episode of chest pain over the lower rib  cage that felt more like a dull ache.  This episode was associated with  lightheadedness.  He took a nitroglycerin, and the pain resolved.  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  With physical  exertion, he has had occasional symptoms but overall has been doing  well.  He denies dyspnea, edema, orthopnea, PND, or syncope.   MEDICATIONS:  1. Plavix 75 mg daily.  2. Simvastatin 80 mg daily.  3. Lisinopril 5 mg daily.  4. Atenolol 50 mg daily.  5. Zolpidem 10 mg daily.  6. Aspirin 81 mg daily.  7. Multivitamin daily.  8. Warfarin as directed.  9. Aleve daily.  10.Echinacea.  11.B12.  12.Metformin 500 mg daily.   PHYSICAL EXAM:  The patient is alert and oriented, very pleasant male in  no acute distress.  Weight 195, blood pressure 116/72, heart rate 60, respiratory rate 16.  HEENT:  Normal.  NECK:  Normal carotid upstrokes without bruits.  Jugular venous pressure  is normal.  LUNGS:   Clear to auscultation bilaterally.  HEART:  Regular rate and rhythm without murmurs or gallops.  ABDOMEN:  Soft, nontender.  No organomegaly, no abdominal bruits.  EXTREMITIES:  No clubbing, cyanosis, or edema.  Peripheral pulses 2+ and  equal throughout.   EKG demonstrates sinus bradycardia with nonspecific ST segment and T  wave changes.   ASSESSMENT:  1. Coronary artery disease status post multivessel PCI.  I am      concerned about [name]'s recent episodes of chest pain.  I have      asked him to continue his current medical therapy, and we will plan      on evaluating him with an exercise Myoview stress study.  If his      stress test demonstrates ischemia or high risk features, we will      proceed with diagnostic catheterization.  He should continue his      current regimen at present.  I had considered discontinuing his      Plavix at this office  visit, but I think we will wait until he is      further risk stratified with this stress study.  Overall, I do not      like the idea of him being on aspirin, Plavix, and Warfarin in      combination for the long-term.  2. Ischemic cardiomyopathy.  His left ventricular function has      improved following his prior revascularization procedure.  His most      recent left ventricular ejection fraction was estimated in the      range of 40-50%.  We will reassess left ventricular function with      his Myoview study.  Continue his ACE inhibitor and beta blocker at      present.  3. Dyslipidemia.  He remains on high dose simvastatin.  Lipids in June      showed a cholesterol of 140, HDL 33, and LDL of 61.  His      triglycerides were elevated at 339.  We had recommended further      lifestyle modification and plan on repeating lipids and LFTs in      December.  4. Paroxysmal atrial fibrillation.  He remains on Warfarin for stroke      prophylaxis.   For followup, I have scheduled [name] for 6 months.  I will follow up  with him  after the results of his Myoview study.     [name]. Excell Seltzer, MD  Electronically Signed    MDC/MedQ  DD: 01/08/2007  DT: 01/08/2007  Job #: [id number]   cc:   Rosalyn Gess. Norins, MD

## 2010-07-13 NOTE — Assessment & Plan Note (Signed)
[facility]                         ELECTROPHYSIOLOGY OFFICE NOTE   NAME:[name], [name] QUERRY                        MRN:          [id number]  DATE:11/06/2007                            DOB:          23-May-1945    [name] is seen in followup for aborted sudden cardiac death following  defibrillator implantation at [facility], [place].  He has had 2  episodes of ICD discharge associated with atrial fibrillation, which has  been rapid.   He also complained of significant lassitude, which has been worse since  up titration of his atenolol in June that currently is at 50 mg twice  daily.  He is also taking simvastatin, lisinopril 5, warfarin, metformin  500 b.i.d., and testosterone.   PHYSICAL EXAMINATION:  VITAL SIGNS:  His blood pressure today is 108/75  with a pulse of 75.  LUNGS:  Clear.  HEART:  Sounds were regular.  ABDOMEN:  Soft.  EXTREMITIES:  No edema.   Interrogation of his ICD demonstrates a Guidant device with atrial  fibrillation occurring 50% at the time in the last year.  The  fibrillation rate 4, the atrial impedance was 578, the R-wave was 25  with impedance of 670, the threshold 1 volt at 0.5.  Battery voltage  is  2.60.   IMPRESSION:  1. Aborted sudden cardiac death.  2. Status post implantable cardioverter-defibrillator for the above.  3. Ischemic heart disease with ejection fraction of 45% and prior      percutaneous coronary intervention.  4. Atrial fibrillation with inappropriate therapy.  5. Fatigue.  Questions related to atenolol.   [name] is doing fine from a ventricular arrhythmia point of view.  Heart rate continues to have atrial fibrillation with inappropriate ICD  discharges.  For augmented rate control, I have added Lanoxin.  I have  also given a prescriptions for Inderal LA 80, metoprolol succinate 100,  and nadolol 40, as an alternative to his atenolol to see if we can find  a drug that is less associated  with fatigue.   He is to follow up with Dr. Excell Seltzer in 6 months and let us know in 6  weeks how he is doing on the above drugs.     Duke Salvia, MD, [facility]  Electronically Signed   SCK/MedQ  DD: 11/06/2007  DT: 11/07/2007  Job #: [id number]

## 2010-07-13 NOTE — Assessment & Plan Note (Signed)
[facility]                            CARDIOLOGY OFFICE NOTE   NAME:[name], LINDBERG [name]                        MRN:          [id number]  DATE:06/29/2006                            DOB:          [birth year]    [name] was seen in followup at the [facility] Cardiology office on Jun 29, 2006.  He is a 65 year old gentleman who has a history of coronary  artery disease, ischemic cardiomyopathy, and aborted sudden cardiac  death.  He is now status post ICD placement as well as revascularization  with multi-vessel drug-eluting stents all performed at [facility] over 1 year ago.  [name] was initially seen  here in October of 2007, approximately 3 weeks after an ICD shock.  His  ICD was interrogated and he subsequently saw Dr. Graciela Husbands for followup.  He  was inappropriately shocked for rapid atrial fibrillation.  He did not  have any further ICD discharges since that time.   From a symptomatic standpoint, [name] is doing relatively well.  He  does complain of some decreased energy and fatigue.  Some days are  better than others.  He is currently exercising about 15 minutes daily.  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  He has not had any palpitations, chest pain, dyspnea,  light-headedness, or syncope.   CURRENT MEDICATIONS:  1. Include Plavix 75 mg daily.  2. Simvastatin 80 mg daily.  3. Lisinopril 5 mg daily.  4. Atenolol 50 mg daily.  5. Zolpidem 10 mg.  6. Aspirin 81 mg daily.  7. Echinacea daily.  8. Multivitamin daily.  9. Warfarin as directed.   No drug allergies.   EXAMINATION:  He is alert and oriented in no acute distress.  Weight is 203 pounds, blood pressure is 126/78, heart rate 72,  respiratory rate 16.  HEENT:  Normal.  NECK:  Normal carotid upstrokes without bruits.  Jugular venous pressure  is normal.  LUNGS:  Clear to auscultation bilaterally.  HEART:  The  apex is discrete and nondisplaced.  The heart is regular  rate and rhythm without murmurs or gallops.  ABDOMEN:  Soft and nontender.  No organomegaly.  Normal bowel sounds.  No bruits.  EXTREMITIES:  No cyanosis, clubbing, or edema.  Peripheral pulses are 2+  and equal throughout.   EKG in the office today shows atrial fibrillation with demand  ventricular pacing.  On his native beats, there is a nonspecific T wave  abnormality present.   An echocardiogram from December 27, 2005 demonstrated a mildly decreased  overall left ventricular systolic function with an EF estimated between  40 and 50%.  There was hypokinesis of the inferoposterior wall with mild  mitral regurgitation and mild dilation of the left atrium.   ASSESSMENT:  [name] is currently stable from a cardiovascular  standpoint.  His cardiac issues are as follows.  1. Ischemic cardiomyopathy.  He has clearly had some recovery of left      ventricular function as his initial left  ventricular ejection      fraction when he presented to [facility] was 30%      and he is now in the range of 40-50%.  He is status post      implantable cardioverter defibrillator placement and has been      revascularized via stenting.  He is not having any symptoms of      ischemia, and he is on good medical therapy with a combination of      lisinopril and atenolol.  He had some problems with Coreg in the      past, and that is why I did not make the change to that medication      when I initially saw him.  I think with his ejection fraction up      near 50% now, atenolol is a reasonable agent for him.  He should      continue on aspirin and clopidogrel, but when he is 2 years out      from his stenting, which will be later this summer, I would      consider taking him off of Plavix because of the increased long-      term risk of bleeding on the combination of aspirin, Plavix, and      warfarin.  I told him that we would  discuss this at his next visit      and likely discontinue his Plavix at that time.  2. Dyslipidemia.  He is on simvastatin.  I do not have any recent      records of his lipids or LFTs, and have ordered these to be done in      the near future.  I will be in contact with him with regard to      whether any changes are necessary.  3. Atrial fibrillation.  Continue anticoagulation with warfarin.   For followup I will plan on seeing [name] back in 6 months or sooner  if any new problems arise.  He will continue to follow up with Dr.  Debby [name] for his primary care.     [name]. Excell Seltzer, MD  Electronically Signed    MDC/MedQ  DD: 06/29/2006  DT: 06/29/2006  Job #: [id number]   cc:   [name] Gess. Norins, MD

## 2010-07-13 NOTE — Assessment & Plan Note (Signed)
[facility]                            CARDIOLOGY OFFICE NOTE   NAME:[name], [name]                        MRN:          [id number]  DATE:01/08/2008                            DOB:          August 07, 1945    [name] was seen in followup at the [facility] Cardiology Office on  January 08, 2008.  He is a 65 year old gentleman with coronary artery  disease, ischemic cardiomyopathy, and paroxysmal atrial fibrillation.  He underwent two-vessel PCI with drug-eluting stents and also had an ICD  placed at [facility] in 2007.  His main complaint continues  to be generalized fatigue.  He still works as a Investment banker, corporate and is very  tired after a day of work.  It generally takes him 2 days to recover  after setting up for show.  He also complains of fatigue on other days.  His medicines were adjusted to try to minimize medication related  fatigue.  He has temporarily been off of beta-blockers, but has noticed  no change in symptoms.  Lanoxin was started for rate control, but he  decided to stop this medication as well.  He is off of lisinopril and I  think this was because of miscommunication as this was not intended to  be stopped.  He denies chest pain, dyspnea, orthopnea, PND, or edema.  He has had 2 ICD discharges, since his last visit here, and his ICD was  adjusted in our [facility].  He has had no further problems since  that time.   MEDICATIONS:  1. Simvastatin 80 mg daily.  2. Aspirin 81 mg daily.  3. Multivitamin 1 daily.  4. Warfarin as directed.  5. Aleve 1 daily.  6. Echinacea daily.  7. B12.  8. Metformin 500 mg twice daily.  9. Testosterone shots monthly.   PHYSICAL EXAMINATION:  GENERAL:  The patient is alert and oriented.  No  acute distress.  VITAL SIGNS:  Weight is 193 pounds, blood pressure 110/90, heart rate  70, and respiratory rate 16.  HEENT:  Normal.  NECK:  Normal carotid upstrokes.  No bruits.  JVP normal.  LUNGS:   Clear bilaterally.  HEART:  Regular rate and rhythm.  No murmurs or gallops.  ABDOMEN:  Soft, nontender.  No organomegaly.  EXTREMITIES:  No clubbing, cyanosis, or edema.   EKG shows ventricular-paced rhythm with an underlying atrial flutter.   ASSESSMENT:  1. Coronary artery disease, status post myocardial infarction.  2. Left ventricular ejection fraction.  A nuclear study in November      2008 was 45%.  There was no significant ischemia at that time.      Continue aspirin and secondary risk reduction measures.  3. Paroxysmal atrial fibrillation/flutter with an appropriate      implantable cardioverter-defibrillator discharges.  Adjustments      have been made in the [facility].  I think he would benefit      from resuming an ACE inhibitor and I have taken the liberty to      start him back on atenolol 50 mg daily.  He  had done well on that      dose previously, but had increased fatigue when his atenolol was      doubled.  4. Ischemic cardiomyopathy.  Resume atenolol as outlined.  Also,      resume lisinopril 5 mg daily.  5. Dyslipidemia.  Lipids from June 2009 showed a cholesterol of 112,      triglycerides 207, HDL 27, and LDL 57.  Continue simvastatin.      Repeat lipids next summer.   For followup, I would like to see [name] back in 6 months.     [name]. Excell Seltzer, MD  Electronically Signed    MDC/MedQ  DD: 01/08/2008  DT: 01/08/2008  Job #: [id number]

## 2010-07-13 NOTE — Assessment & Plan Note (Signed)
[facility]                            CARDIOLOGY OFFICE NOTE   NAME:Dragan, [name]                        MRN:          [id number]  DATE:08/21/2007                            DOB:          1945/05/05    [name] was seen in followup with [facility] Cardiology office on August 21, 2007.  He is a 65 year old gentleman with coronary artery disease,  ischemic cardiomyopathy, and paroxysmal atrial fibrillation.  He is  status post stenting and ICD placement at [facility] over 2  years ago.  [name] has been getting along well.  He denies chest  pain, dyspnea, lightheadedness, or syncope.  He did have a discharge of  his ICD that occurred during sex.  He has had no other ICD discharges  reported.  He did not have loss of consciousness at that time.  He  denies palpitations or other complaints.   CURRENT MEDICATIONS:  1. Simvastatin 80 mg daily.  2. Lisinopril 5 mg daily.  3. Atenolol 50 mg daily.  4. Aspirin 81 mg daily.  5. Multivitamin daily.  6. Warfarin as directed.  7. Aleve daily.  8. Echinacea.  9. B12.  10.Metformin 500 mg daily.  11.Testosterone shots monthly.   ALLERGIES:  CANNED SALMON.   PHYSICAL EXAMINATION:  GENERAL:  The patient is alert and oriented.  He  is in no acute distress.  VITAL SIGNS:  Weight 192, blood pressure 117/77, heart rate 87, and  respiratory rate 16.  HEENT:  Normal.  NECK:  Normal carotid upstrokes without bruits.  Jugular venous pressure  is normal.  LUNGS:  Clear to auscultation bilaterally.  HEART:  Irregularly irregular without murmurs or gallops.  ABDOMEN:  Soft and nontender.  No organomegaly.  EXTREMITIES:  No clubbing, cyanosis, or edema.  Peripheral pulses are  intact and equal.   EKG shows atrial fibrillation with demand ventricular pacing.   Lab work from June 16 shows creatinine 1.2, potassium 4.5, glucose 175.  LFTs normal.  Cholesterol is 112, triglycerides 207, HDL is 27, LDL  is  57.   ASSESSMENT:  1. Coronary artery disease with ischemic cardiomyopathy.  Continue      medical therapy.  The patient has no evidence of angina.  He had a      recent Myoview scan in November 2008 for chest pain and this      demonstrated inferior scar with no ischemia.  Ejection fraction was      calculated at 45%.  2. His ischemic cardiomyopathy with recent implantable cardioverter-      defibrillator discharge.  His implantable cardioverter-      defibrillator was interrogated and his rate limits were modified.      He also elected to increase his atenolol dose to 50 mg twice daily      to try to reduce his ventricular rate with regard to his atrial      fibrillation.  3. Dyslipidemia.  Lipids improved.  Continue high-dose simvastatin.  4. Paroxysmal atrial fibrillation.  The patient is in atrial      fibrillation  today.  Continue warfarin and rate control with      adjustment of atenolol dose as noted above.   For followup, I would like to see Mr. Glasscock back in 6 months.     [name]. Excell Seltzer, MD  Electronically Signed    MDC/MedQ  DD: 08/21/2007  DT: 08/22/2007  Job #: [id number]

## 2010-07-13 NOTE — Assessment & Plan Note (Signed)
[facility]                         ELECTROPHYSIOLOGY OFFICE NOTE   NAME:[name], [name]                        MRN:          [id number]  DATE:11/14/2006                            DOB:          04-Feb-1946    HISTORY OF PRESENT ILLNESS:  [name] is seen following ICD  implantation for aborted cardiac arrest.  He is doing well.  He has no  complaints of chest pain or shortness of breath.   His most recent ejection fraction assessment a year ago was about 45-  50%.  His medications currently include Plavix, Simvastatin, Atenolol,  Lisinopril and aspirin as well as Coumadin.   PHYSICAL EXAMINATION:  VITAL SIGNS:  Blood pressure 109/69, pulse 66.  LUNGS:  Clear.  HEART:  Sounds were regular.  EXTREMITIES:  Without peripheral edema.   IMPRESSION:  1. Ischemic heart disease.      a.     Prior MI.      b.     Ejection fraction 40-50%.  2. Aborted cardiac arrest status post ICD implantation.  3. Mild bradycardia.  4. Paroxysmal atrial fibrillation resulting in inappropriate ICD      discharge.  5. Thromboembolic risk factors notable for hypertension, depressed LV      function - previous.   Mr.  [name] is stable.  We will continue him on his current medications.  We will see him again in one year's time.  He will be followed remotely  in the interim.     Duke Salvia, MD, [facility]  Electronically Signed    SCK/MedQ  DD: 11/14/2006  DT: 11/15/2006  Job #: [id number]

## 2010-07-13 NOTE — Assessment & Plan Note (Signed)
[facility]                            CARDIOLOGY OFFICE NOTE   NAME:Looman, SHEP PORTER                        MRN:          [id number]  DATE:04/22/2008                            DOB:          1945/10/25    REASON FOR VISIT:  Followup coronary artery disease.   HISTORY OF PRESENT ILLNESS:  [name] is a 65 year old gentleman with  coronary artery disease, ischemic cardiomyopathy, and paroxysmal atrial  fibrillation.  He has undergone previous PCI procedure.  He was last  seen in November 2009 at which time he had 2 ICD discharges since his  previous visit.  Some adjustments were made at the [facility].  He  has had no further problems since that time.  He denies palpitations.  However, he describes persistent symptoms of profound fatigue.  He  denies near syncope.  He has also had episodes of chest discomfort that  have occurred at rest.  This is mainly on the left side of the chest and  described as a pressure-like sensation and feels like a bubble  traveling upward.  He has not had exertional symptoms.  He denies  dyspnea, orthopnea, PND, or edema.   CURRENT MEDICATIONS:  1. Simvastatin 80 mg.  2. Warfarin 5 mg.  3. Lisinopril 5 mg.  4. Zolpidem 10 mg one-half daily.  5. Atenolol 25 mg daily.  6. Aspirin 81 mg daily.  7. Metformin 500 mg b.i.d.  8. Vitamin B12 daily.  9. Multivitamin one daily.  10.Echinacea 400 mg daily.  11.Testosterone shot monthly.  12.Metoprolol ER 100 mg daily.   ALLERGIES:  NKDA.   REVIEW OF SYSTEMS:  A 12-point review of systems was performed.  There  were no pertinent positives to report except as above.   PHYSICAL EXAMINATION:  GENERAL:  The patient is alert and oriented.  No  acute distress.  VITAL SIGNS:  Weight is 196 pounds, blood pressure 94/74, heart rate 72,  respiratory rate is 12.  HEENT:  Normal.  NECK:  Normal carotid upstrokes.  No bruits.  JVP normal.  No  thyromegaly or thyroid nodules.  LUNGS:  Clear bilaterally.  Heart regular rate and rhythm.  No murmurs  or gallops.  ABDOMEN:  Soft, nontender.  No organomegaly.  EXTREMITIES:  No clubbing, cyanosis, or edema.  Peripheral pulses intact  and equal.  SKIN:  Warm and dry without rash.   ASSESSMENT:  1. Coronary artery disease with ischemic cardiomyopathy and left      ventricular ejection fraction of 45%.  I am concerned about the      patient's recent episodes of chest pain as well as his profound      fatigue.  We will do an adenosine Myoview stress scan to rule out      recurrent ischemia.  Continue current medical therapy with change      as noted below.  2. Paroxysmal atrial fibrillation/flutter.  The patient is on 2 beta      blockers at this time.  It must be due to some medication error.      He  is on a good dose of long-acting metoprolol, but a low dose of      atenolol.  I have asked him to discontinue his atenolol altogether.      He will continue on metoprolol ER 100 mg daily.  He remains on      Coumadin for anticoagulation.  3. Dyslipidemia.  Continue simvastatin.  Lipids from June 2009 showed      a cholesterol of 112, HDL 27, LDL 57, triglycerides were 207.  He      should have followup lipids and LFTs this summer.  LFTs were normal      at his last check.  4. For followup, I have recommended a Myoview stress scan as above,      and he will be contacted after the results are available.  Followup      office visit in 6 months.  The only medication change made today      was the discontinuation of atenolol.     [name]. Excell Seltzer, MD     MDC/MedQ  DD: 04/22/2008  DT: 04/23/2008  Job #: [id number]

## 2010-07-16 NOTE — Letter (Signed)
January 04, 2006    [name]. Excell Seltzer, MD  [address]  [place], [place] [address]   RE:  [name], [name]  MRN:  [id number]  /  DOB:  04-May-1945   Dear [name],   It was a pleasure to see Lark Runk at your request to establish long-term  ICD therapy.   As you know, he has a history of coronary artery disease presenting about a  year ago with syncope __________  and was found to have high-grade coronary  disease. It is not clear whether he had a myocardial infarction. His  ejection fraction was 30%. He underwent PCI of his RCA and circumflex and  underwent ICD implantation. The thinking behind the ICD implantation is not  available although I suspect it was felt to be for primary prevention in the  setting of depressed LV function of both the secondary prevention in the  setting of an MI. He received a dual-chamber device. He subsequently went  back and had PCI of his LAD.   He has been doing quite well. He saw you and had a repeat echo with an  ejection fraction measured at 40-50%. He has no complaints of chest pain,  shortness of breath or lightheadedness.   He did have an ICD discharge. 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 He has some history of fluttering that are often nocturnal and some  modest impact of exercise tolerance.   PAST MEDICAL HISTORY:  In addition to the above is notable for:  1. Hypertension.  2. Dyslipidemia.   PAST SURGICAL HISTORY:  Not available.   FAMILY AND SOCIAL HISTORY:  He is single, he has one child. He works putting  up stages.   REVIEW OF SYSTEMS:  Notable for some fatigue and some confusion and the  question is this related to atenolol.   MEDICATIONS:  1. Atenolol 50.  2. Plavix 75.  3. Warfarin 5.  4. Simvastatin 80.  5. Lisinopril 5.  6. __________  10 h.s.  7. Aspirin 81.   He has no known drug allergies.   PHYSICAL EXAMINATION:  VITAL SIGNS:  His blood pressure was 116/80, his  pulse was 64.  HEENT:   Demonstrated no icterus or xanthoma.  NECK:  The neck veins were flat. Carotids were brisk and full bilaterally  without bruits.  BACK:  Without kyphosis or scoliosis.  LUNGS:  Clear.  HEART:  Sounds were regular without murmurs or gallop.  ABDOMEN:  Soft with active bowel sounds.  EXTREMITIES:  Femoral pulses were not examined, distal pulses were intact.  There was no cyanosis, clubbing or edema.  NEUROLOGIC:  Grossly normal.  SKIN:  Warm and dry.   Electrocardiogram dated today demonstrated a sinus rhythm at 54 with  intervals of 0.21/0.10/0.42.   Interrogation of his guidance vitality [name] device demonstrated a P wave of  6.4 with impedance of 550, a threshold of 0.42 to 0.5. The R wave was  greater than 25 with impedance threshold of 1 volt at 0.5, battery volt at  3.18, shock impedance of 52. Evaluation of the previous shock therapy  demonstrated that he was in atrial fibrillation, regular rise to an atrial  flutter and subsequently underwent fibrillator discharge with restoration of  sinus rhythm.   Histogram demonstrated that atrial fibrillation is a common thing comprising  at least 20% of his heart beat.   IMPRESSION:  1. Ischemic heart disease.      a.  He is status post prior PCI x3.      b.     Ejection fraction most recently of 40-50%, previously at 30%.      c.     Question prior myocardial infarction at his initial       presentation.  2. Status post implantable cardioverter defibrillator in the context of      the above.  3. Mild bradycardia.  4. Paroxysmal atrial fibrillation with a rapid ventricular response      resulting in inappropriate implantable cardioverter defibrillator      discharge.  5. Thromboembolic risk factors notable for:      a.     Hypertension.      b.     Previously depressed left ventricular function.   [name], Mr. Route's defibrillator is working well. I have reprogrammed the  device today to try to minimize the likelihood of  inappropriate discharge  and I have reviewed with the patient the thinking behind this and he is  accepting of that.   We will increase his atenolol notwithstanding his relative bradycardia as he  has a dual-chamber device. This may help obviate some of his rapid  ventricular responses and hopefully an appropriate shock therapy.   We will look forward to getting the records from [facility] to understand the  thinking behind his ICD implantation.   Will plan to see him again in one year's time to review the above.   He is to continue Coumadin given his thromboembolic risk factors although  with his improving left ventricular function, there is some reason to think  that perhaps thromboembolic risks are going down with his improving left  ventricular function.    Sincerely,     ______________________________  Duke Salvia, MD, [facility]    SCK/MedQ  DD: 01/04/2006  DT: 01/04/2006  Job #: [id number]   CC:    Rosalyn Gess. Norins, MD

## 2010-07-16 NOTE — Assessment & Plan Note (Signed)
[facility]                              CARDIOLOGY OFFICE NOTE   NAME:[name], [name]                        MRN:          [id number]  DATE:12/13/2005                            DOB:          12/19/1945    [name] is a very pleasant 65 year old male who is here to establish care  today.  [name] has a history of coronary artery disease, ischemic  cardiomyopathy, and survived sudden cardiac death.   His cardiac history began approximately 1 year ago when he was setting up a  stage in [place] and experienced a ventricular fibrillation cardiac  arrest.  He does not recall the details of this event.  He does not remember  having chest pain or angina prior to this event.  There was a paramedic  nearby and he was defibrillated twice, and was taken to [facility].  In review of their records, it appears  that he was stabilized and extubated.  He subsequently went for cardiac  catheterization, and was found to have high-grade left circumflex and right  coronary artery disease, and was treated with drug-eluting stents in both of  those vessels.  His ejection fraction was estimated to be 30%.  He underwent  ICD placement during his hospitalization with a dual-chamber ICD.  He also  had moderate disease of his LAD with a 70% lesion there and underwent a  staged percutaneous intervention of that vessel as well.   Since discharge from the hospital 1 year ago he has been followed regularly  at [facility].  He is seen in their pacemaker clinic and has also seen a  cardiologist there.  His primary cardiologist, Dr. Gabrielle Dare, has apparently left  [facility], and he now presents to establish care here.   RECENT HISTORY:  Includes 1 ICD shock approximately 3 weeks ago.  That is  the only time that his device has fired.  At that time he was doing some  work outside and he had no prodromal symptoms.  He did not have syncope  at  the time of the shock.  He has been asymptomatic since that time.  He called  his physician in [place], but has not had any evaluation since his ICD  shock.  He specifically denies chest pain, orthopnea, PND, palpitations, or  syncope.  He has occasional shortness of breath.  His main complaint at  present is a feeling of fatigue and lack of energy.  He feels depressed  because he is not able to be as active as he would like to be.   CURRENT MEDICATIONS:  1. Simvastatin 80 mg daily.  2. Warfarin 5 mg daily.  3. Plavix 75 mg daily.  4. Atenolol 50 mg daily.  5. Lisinopril 5 mg daily.  6. Zolpidem 10 mg at bedtime.  7. Aspirin 81 mg daily.  8. Vitamin B complex daily.  9. Echinacea 500 mg daily.  10.Multivitamin daily.   PAST MEDICAL HISTORY:  Pertinent for the following.  1. Coronary artery disease status post multivessel stenting as described  above.  2. Ischemic cardiomyopathy with aborted sudden cardiac death, now status      post dual chamber ICD placement.  3. Paroxysmal atrial fibrillation, maintained on chronic anticoagulation      with Coumadin.  4. Dyslipidemia.  5. Hypertension.   FAMILY HISTORY:  Pertinent for his mother dying from cancer at age 67, and  his father died from a brain aneurysm at age 40.  He has a sister who is  alive and well.   SOCIAL HISTORY:  [name] is single.  He has 1 child.  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  He does fairly heavy physical work.  He does not smoke cigarettes.  He  drinks less than 1 alcoholic beverage per day.  He does not drink caffeine.  He performs some weight lifting on a regular basis, but does not do any  cardiovascular exercise.   REVIEW OF SYSTEMS:  A complete 12-point review of systems was performed and  the pertinent positives included occasional light-headedness and dizziness,  and lack of energy as detailed above.  There are no other  pertinent  positives to report.   PHYSICAL EXAMINATION:  The patient is alert and oriented.  He is in no acute  distress.  His weight is 202 pounds, blood pressure 120/70, heart rate is 50,  respiratory rate is 12.  EYES:  Pupils are equal, round and reactive to light.  Sclerae anicteric.  Conjunctivae pink.  ENT:  Moist oral mucosa.  Oropharynx is clear.  NECK:  Normal carotid upstrokes without bruits.  Jugular venous pressure is  normal.  LUNGS:  Clear to auscultation bilaterally.  CARDIOVASCULAR:  The apex is discrete, non-displaced.  The heart is regular  rate and rhythm without murmurs or gallops.  ABDOMEN:  Soft, nontender.  No organomegaly.  No abdominal bruits.  EXTREMITIES:  No cyanosis, clubbing, or edema.  Peripheral pulses are 2+ and  equal throughout.  SKIN:  Warm and dry without rash.  LYMPHATICS:  There is no adenopathy.  NEUROLOGIC:  Strength is grossly intact with 5/5 motor strength bilaterally.   ELECTROCARDIOGRAM:  Demonstrates AV sequential pacing.   ASSESSMENT:  1. Ischemic cardiomyopathy, currently New [place] Heart Association Class I.  2. Coronary artery disease status post multivessel percutaneous coronary      intervention, maintained on dual antiplatelet therapy with aspirin and      Plavix with no recurrent symptoms.  3. History of atrial fibrillation arrest now status post dual chamber      implantable cardioverter defibrillator.  4. Paroxysmal atrial fibrillation, currently AV sequentially paced and      maintained on anticoagulation therapy with warfarin.  5. Hypertension.  6. Dyslipidemia.   DISPOSITION:  I reviewed all of the details of [name]'s notes from [facility] during his hospitalization 1 year ago.  He has been  clinically stable since that time.  I have not recommended any medication  changes.  Of note, he was previously on carvedilol, but was unable to afford this medication due to cost, and that is the rationale for  atenolol as the  choice of his beta blocker.  Otherwise, he is maintained on good evidence-  based therapy for his ischemic cardiomyopathy.  [name] is going to leave  for an extended trip to [place] over the winter and he will continue to work  in that area.  He is currently being followed  at our Coumadin Clinic here at  [facility], and they are going to make a recommendation to where he can be  followed in [place].  I have also been working on arranging him to be seen  at the pacemaker clinic here for his ICD followup.  We discussed his atrial  fibrillation and, if it can be determined through device interrogation that  he is not having paroxysms of atrial fib over an extended period of time, we  could consider discontinuing his Coumadin therapy.  I am going to check an  echocardiogram to reevaluate his left ventricular function, as he has now  been on therapy for greater than 1 year, and would like to see if he has had  any recovery after medical therapy, as well as revascularization with  multivessel PCI.   I am going to plan on seeing [name] back in approximately 6 months after  he returns from [place].  He was given our contact information in case he  has any problems while he is out of town.       [name]. Excell Seltzer, MD     MDC/MedQ  DD:  12/13/2005  DT:  12/14/2005  Job #:  [id number]   cc:   Rosalyn Gess. Norins, MD

## 2010-08-03 ENCOUNTER — Ambulatory Visit (INDEPENDENT_AMBULATORY_CARE_PROVIDER_SITE_OTHER): Payer: BC Managed Care – PPO | Admitting: *Deleted

## 2010-08-03 ENCOUNTER — Telehealth: Payer: Self-pay | Admitting: *Deleted

## 2010-08-03 DIAGNOSIS — I4891 Unspecified atrial fibrillation: Secondary | ICD-10-CM

## 2010-08-03 DIAGNOSIS — Z7901 Long term (current) use of anticoagulants: Secondary | ICD-10-CM

## 2010-08-03 NOTE — Telephone Encounter (Signed)
Fax from [facility] [phone] refill for Zolpidem Tartrate 10 mg SIG 1 tablet at bedtime prn for sleep. Last fill was 06/28/2010 last OV was 12/09/2010. Please Advise refills

## 2010-08-04 MED ORDER — ZOLPIDEM TARTRATE 10 MG PO TABS: 10.0000 mg | ORAL_TABLET | Freq: Every evening | ORAL | Status: DC | PRN

## 2010-08-04 NOTE — Telephone Encounter (Signed)
k for refill x 5 

## 2010-08-31 ENCOUNTER — Ambulatory Visit (INDEPENDENT_AMBULATORY_CARE_PROVIDER_SITE_OTHER): Payer: BC Managed Care – PPO | Admitting: *Deleted

## 2010-08-31 DIAGNOSIS — I4891 Unspecified atrial fibrillation: Secondary | ICD-10-CM

## 2010-08-31 DIAGNOSIS — Z7901 Long term (current) use of anticoagulants: Secondary | ICD-10-CM

## 2010-08-31 LAB — POCT INR: INR: 2.2

## 2010-09-28 ENCOUNTER — Ambulatory Visit (INDEPENDENT_AMBULATORY_CARE_PROVIDER_SITE_OTHER): Payer: BC Managed Care – PPO | Admitting: *Deleted

## 2010-09-28 DIAGNOSIS — Z7901 Long term (current) use of anticoagulants: Secondary | ICD-10-CM

## 2010-09-28 DIAGNOSIS — I4891 Unspecified atrial fibrillation: Secondary | ICD-10-CM

## 2010-09-28 LAB — POCT INR: INR: 2.7

## 2010-10-11 ENCOUNTER — Other Ambulatory Visit: Payer: Self-pay | Admitting: Internal Medicine

## 2010-10-21 ENCOUNTER — Other Ambulatory Visit: Payer: Self-pay | Admitting: Internal Medicine

## 2010-10-21 NOTE — Telephone Encounter (Signed)
Refill request

## 2010-10-26 ENCOUNTER — Encounter: Payer: BC Managed Care – PPO | Admitting: *Deleted

## 2010-11-25 ENCOUNTER — Ambulatory Visit (INDEPENDENT_AMBULATORY_CARE_PROVIDER_SITE_OTHER): Payer: BC Managed Care – PPO | Admitting: *Deleted

## 2010-11-25 DIAGNOSIS — I4891 Unspecified atrial fibrillation: Secondary | ICD-10-CM

## 2010-11-25 DIAGNOSIS — Z7901 Long term (current) use of anticoagulants: Secondary | ICD-10-CM

## 2010-11-25 LAB — POCT INR: INR: 1.8

## 2010-11-29 ENCOUNTER — Other Ambulatory Visit: Payer: Self-pay | Admitting: Internal Medicine

## 2010-12-03 LAB — COMPREHENSIVE METABOLIC PANEL
ALT: 26 U/L (ref 0–53)
Alkaline Phosphatase: 71 U/L (ref 39–117)
BUN: 15 mg/dL (ref 6–23)
Chloride: 97 mEq/L (ref 96–112)
Glucose, Bld: 238 mg/dL — ABNORMAL HIGH (ref 70–99)
Potassium: 4 mEq/L (ref 3.5–5.1)
Sodium: 136 mEq/L (ref 135–145)
Total Bilirubin: 1 mg/dL (ref 0.3–1.2)

## 2010-12-03 LAB — GLUCOSE, CAPILLARY

## 2010-12-03 LAB — URINALYSIS, ROUTINE W REFLEX MICROSCOPIC
Bilirubin Urine: NEGATIVE
Glucose, UA: 100 mg/dL — AB
Ketones, ur: 15 mg/dL — AB
pH: 5.5 (ref 5.0–8.0)

## 2010-12-03 LAB — DIFFERENTIAL
Basophils Absolute: 0 10*3/uL (ref 0.0–0.1)
Basophils Relative: 0 % (ref 0–1)
Eosinophils Absolute: 0 10*3/uL (ref 0.0–0.7)
Monocytes Absolute: 1.5 10*3/uL — ABNORMAL HIGH (ref 0.1–1.0)
Monocytes Relative: 10 % (ref 3–12)
Neutro Abs: 12.7 10*3/uL — ABNORMAL HIGH (ref 1.7–7.7)
Neutrophils Relative %: 85 % — ABNORMAL HIGH (ref 43–77)

## 2010-12-03 LAB — CBC
HCT: 51.3 % (ref 39.0–52.0)
Hemoglobin: 17.3 g/dL — ABNORMAL HIGH (ref 13.0–17.0)
RBC: 5.5 MIL/uL (ref 4.22–5.81)
WBC: 15 10*3/uL — ABNORMAL HIGH (ref 4.0–10.5)

## 2010-12-03 LAB — URINE MICROSCOPIC-ADD ON

## 2010-12-10 ENCOUNTER — Other Ambulatory Visit: Payer: Self-pay | Admitting: Internal Medicine

## 2010-12-21 ENCOUNTER — Ambulatory Visit (INDEPENDENT_AMBULATORY_CARE_PROVIDER_SITE_OTHER): Payer: BC Managed Care – PPO | Admitting: *Deleted

## 2010-12-21 DIAGNOSIS — Z7901 Long term (current) use of anticoagulants: Secondary | ICD-10-CM

## 2010-12-21 DIAGNOSIS — I4891 Unspecified atrial fibrillation: Secondary | ICD-10-CM

## 2010-12-23 ENCOUNTER — Encounter: Payer: BC Managed Care – PPO | Admitting: *Deleted

## 2010-12-23 ENCOUNTER — Ambulatory Visit (INDEPENDENT_AMBULATORY_CARE_PROVIDER_SITE_OTHER): Payer: BC Managed Care – PPO | Admitting: Internal Medicine

## 2010-12-23 DIAGNOSIS — I2510 Atherosclerotic heart disease of native coronary artery without angina pectoris: Secondary | ICD-10-CM

## 2010-12-23 DIAGNOSIS — R200 Anesthesia of skin: Secondary | ICD-10-CM

## 2010-12-23 DIAGNOSIS — R209 Unspecified disturbances of skin sensation: Secondary | ICD-10-CM

## 2010-12-23 NOTE — Assessment & Plan Note (Signed)
NOrmal exam except for decreased facial sensation left. No focal weakness. No signs or symptoms to suggest stroke, trigeminal neuralgia or Bell's palsy. He is fully anticoagulated. He declines MRI brain  Plan - watchful waiting           For progressive symptoms or change - will need MRI and neuro consult.

## 2010-12-23 NOTE — Progress Notes (Signed)
  Subjective:    Patient ID: [name], male    DOB: January 01, 1946, 65 y.o.   MRN: [id number]  HPI [name] presents with a 3 month history of paresthesia, loss of sensation, of the left face. He has had no injury. He has had no facial weakness, change in vision, no increased loss of hearing. He has no watery eye, no dental infections. He has no other symptoms - no focal weakness, slurred speech or cognitive changes. He is fully anticoagulated on warfarin and 81 mg ASA.   I have reviewed the patient's medical history in detail and updated the computerized patient record.    Review of Systems System review is negative for any constitutional, cardiac, pulmonary, GI or neuro symptoms or complaints other than as described in the HPI.     Objective:   Physical Exam Vitals - normal Gen'l- WNWD white man  HEENT- C&S clear, TM normal left Cor - RRR Pulm - normal respirations Neuro - CN II-XII - decreased sensation to light touch across forehead, cheeks, lip on the left compared to right. Hypersensitive at the ear and neck. No facial asymmetry, can pucker, blow out his cheeks, no deviation of the tongue or fasiculations. PERRLA, vision intact. Motor strength is normal, gait is normal       Assessment & Plan:

## 2010-12-23 NOTE — Assessment & Plan Note (Signed)
Patient reports occasional central chest pressure but this is not exertional.   He is carefully instructed that if he has any exertional chest pressure or persistent chest pressure he needs to contact his cardiologist.

## 2010-12-29 ENCOUNTER — Encounter: Payer: Self-pay | Admitting: Internal Medicine

## 2010-12-30 ENCOUNTER — Other Ambulatory Visit: Payer: Self-pay | Admitting: Internal Medicine

## 2011-01-04 ENCOUNTER — Ambulatory Visit (INDEPENDENT_AMBULATORY_CARE_PROVIDER_SITE_OTHER): Payer: BC Managed Care – PPO | Admitting: *Deleted

## 2011-01-04 DIAGNOSIS — I4891 Unspecified atrial fibrillation: Secondary | ICD-10-CM

## 2011-01-04 DIAGNOSIS — Z7901 Long term (current) use of anticoagulants: Secondary | ICD-10-CM

## 2011-01-04 LAB — POCT INR: INR: 2.4

## 2011-01-18 ENCOUNTER — Telehealth: Payer: Self-pay | Admitting: *Deleted

## 2011-01-18 NOTE — Telephone Encounter (Signed)
k

## 2011-01-18 NOTE — Telephone Encounter (Signed)
[name] is wanting to fill his Androgel at [facility], he does not have any refills. Can we get a new prescription? Please Advise

## 2011-01-19 NOTE — Telephone Encounter (Signed)
Do not see medication on med list in epic or centricity

## 2011-01-21 MED ORDER — TESTOSTERONE 20.25 MG/ACT (1.62%) TD GEL: 2.0000 [drp] | Freq: Every day | TRANSDERMAL | Status: DC

## 2011-01-21 NOTE — Telephone Encounter (Signed)
Ok for 1.62% apply two pumps daily to skin, 1 pump bottle, refill x 5

## 2011-01-25 ENCOUNTER — Encounter: Payer: Self-pay | Admitting: Internal Medicine

## 2011-01-25 ENCOUNTER — Encounter: Payer: BC Managed Care – PPO | Admitting: Internal Medicine

## 2011-01-25 ENCOUNTER — Other Ambulatory Visit: Payer: Self-pay | Admitting: *Deleted

## 2011-01-25 ENCOUNTER — Encounter: Payer: BC Managed Care – PPO | Admitting: *Deleted

## 2011-01-25 ENCOUNTER — Ambulatory Visit (INDEPENDENT_AMBULATORY_CARE_PROVIDER_SITE_OTHER): Payer: BC Managed Care – PPO | Admitting: *Deleted

## 2011-01-25 DIAGNOSIS — I2589 Other forms of chronic ischemic heart disease: Secondary | ICD-10-CM

## 2011-01-25 DIAGNOSIS — I4891 Unspecified atrial fibrillation: Secondary | ICD-10-CM

## 2011-01-25 DIAGNOSIS — I5022 Chronic systolic (congestive) heart failure: Secondary | ICD-10-CM

## 2011-01-25 DIAGNOSIS — Z7901 Long term (current) use of anticoagulants: Secondary | ICD-10-CM

## 2011-01-25 LAB — ICD DEVICE OBSERVATION
AL IMPEDENCE ICD: 589 Ohm
ATRIAL PACING ICD: 0.06 pct
CHARGE TIME: 5.455 s
DEV-0020ICD: NEGATIVE
TOT-0001: 2
TOT-0006: 20110228000000
TZAT-0001ATACH: 1
TZAT-0001ATACH: 2
TZAT-0001ATACH: 3
TZAT-0001SLOWVT: 1
TZAT-0002FASTVT: NEGATIVE
TZAT-0002SLOWVT: NEGATIVE
TZAT-0012ATACH: 150 ms
TZAT-0012ATACH: 150 ms
TZAT-0012SLOWVT: 200 ms
TZAT-0018ATACH: NEGATIVE
TZAT-0018ATACH: NEGATIVE
TZAT-0018FASTVT: NEGATIVE
TZAT-0018SLOWVT: NEGATIVE
TZAT-0019FASTVT: 8 V
TZAT-0020ATACH: 1.5 ms
TZON-0003ATACH: 350 ms
TZON-0003FASTVT: 250 ms
TZON-0004VSLOWVT: 20
TZST-0001ATACH: 4
TZST-0001ATACH: 6
TZST-0001FASTVT: 5
TZST-0001FASTVT: 6
TZST-0001SLOWVT: 4
TZST-0001SLOWVT: 6
TZST-0002ATACH: NEGATIVE
TZST-0002FASTVT: NEGATIVE
TZST-0002FASTVT: NEGATIVE
TZST-0002SLOWVT: NEGATIVE
TZST-0002SLOWVT: NEGATIVE
VENTRICULAR PACING ICD: 22.77 pct
VF: 1

## 2011-01-25 MED ORDER — DIGOXIN 125 MCG PO TABS: 125.0000 ug | ORAL_TABLET | Freq: Every day | ORAL | Status: DC

## 2011-01-31 ENCOUNTER — Other Ambulatory Visit: Payer: Self-pay | Admitting: Internal Medicine

## 2011-02-06 NOTE — Telephone Encounter (Signed)
Ok for refill x 5 

## 2011-02-07 ENCOUNTER — Telehealth: Payer: Self-pay | Admitting: *Deleted

## 2011-02-07 NOTE — Telephone Encounter (Signed)
Ok for refills on both: x 5 on both

## 2011-02-07 NOTE — Telephone Encounter (Signed)
Pt requesting refill on androgel and zolpidem, please Advise refills. Pt uses [facility]

## 2011-02-08 NOTE — Telephone Encounter (Signed)
Patient's androgel needs prior auth on Androgel waiting on fax from pharmacy.  Refilled zolpidem.

## 2011-02-18 ENCOUNTER — Ambulatory Visit (INDEPENDENT_AMBULATORY_CARE_PROVIDER_SITE_OTHER): Payer: BC Managed Care – PPO | Admitting: Internal Medicine

## 2011-02-18 ENCOUNTER — Encounter: Payer: Self-pay | Admitting: Internal Medicine

## 2011-02-18 ENCOUNTER — Ambulatory Visit (INDEPENDENT_AMBULATORY_CARE_PROVIDER_SITE_OTHER): Payer: BC Managed Care – PPO | Admitting: *Deleted

## 2011-02-18 DIAGNOSIS — Z95810 Presence of automatic (implantable) cardiac defibrillator: Secondary | ICD-10-CM

## 2011-02-18 DIAGNOSIS — I5022 Chronic systolic (congestive) heart failure: Secondary | ICD-10-CM

## 2011-02-18 DIAGNOSIS — I2589 Other forms of chronic ischemic heart disease: Secondary | ICD-10-CM

## 2011-02-18 DIAGNOSIS — R079 Chest pain, unspecified: Secondary | ICD-10-CM

## 2011-02-18 DIAGNOSIS — Z7901 Long term (current) use of anticoagulants: Secondary | ICD-10-CM

## 2011-02-18 DIAGNOSIS — I4891 Unspecified atrial fibrillation: Secondary | ICD-10-CM

## 2011-02-18 DIAGNOSIS — I255 Ischemic cardiomyopathy: Secondary | ICD-10-CM

## 2011-02-18 LAB — ICD DEVICE OBSERVATION
AL IMPEDENCE ICD: 589 Ohm
ATRIAL PACING ICD: 0.02 pct
CHARGE TIME: 5.455 s
DEV-0020ICD: NEGATIVE
TOT-0001: 2
TOT-0002: 0
TOT-0006: 20110228000000
TZAT-0001ATACH: 3
TZAT-0001SLOWVT: 1
TZAT-0002ATACH: NEGATIVE
TZAT-0004SLOWVT: 8
TZAT-0005SLOWVT: 88 pct
TZAT-0012ATACH: 150 ms
TZAT-0012ATACH: 150 ms
TZAT-0012FASTVT: 200 ms
TZAT-0013SLOWVT: 3
TZAT-0018FASTVT: NEGATIVE
TZAT-0019ATACH: 6 V
TZAT-0019ATACH: 6 V
TZAT-0019FASTVT: 8 V
TZAT-0020ATACH: 1.5 ms
TZAT-0020ATACH: 1.5 ms
TZAT-0020ATACH: 1.5 ms
TZAT-0020SLOWVT: 1.5 ms
TZON-0003FASTVT: 250 ms
TZON-0003SLOWVT: 280 ms
TZON-0003VSLOWVT: 450 ms
TZON-0004SLOWVT: 40
TZST-0001ATACH: 4
TZST-0001ATACH: 5
TZST-0001ATACH: 6
TZST-0001FASTVT: 2
TZST-0001FASTVT: 6
TZST-0001SLOWVT: 2
TZST-0001SLOWVT: 4
TZST-0001SLOWVT: 5
TZST-0002ATACH: NEGATIVE
TZST-0002FASTVT: NEGATIVE
TZST-0002FASTVT: NEGATIVE
TZST-0002FASTVT: NEGATIVE
TZST-0002FASTVT: NEGATIVE
TZST-0003SLOWVT: 20 J
TZST-0003SLOWVT: 35 J
TZST-0003SLOWVT: 35 J
TZST-0003SLOWVT: 35 J
VENTRICULAR PACING ICD: 24 pct

## 2011-02-18 MED ORDER — NITROGLYCERIN 0.4 MG SL SUBL: 0.4000 mg | SUBLINGUAL_TABLET | SUBLINGUAL | Status: DC | PRN

## 2011-02-18 MED ORDER — METOPROLOL TARTRATE 100 MG PO TABS: ORAL_TABLET | ORAL | Status: DC

## 2011-02-18 NOTE — Assessment & Plan Note (Signed)
He has significant limitations of exercise tolerance. I suspect that this is a large part related to rapid heart rates. His last Myoview scan was 4 years ago. He has been having atypical chest pains, however, I don't think that these are ischemic. He does have a residual 70% LAD disease, and it may well be that he needs repeat consideration of coronary anatomy assessment. For him that there has been major issues of cost

## 2011-02-18 NOTE — Assessment & Plan Note (Signed)
The patient's device was interrogated and the information was fully reviewed.  The device was reprogrammed to To try to avoid inappropriate shocks. To this end we created a monitoring VT zone with discriminators at 220-250

## 2011-02-18 NOTE — Assessment & Plan Note (Signed)
As above.

## 2011-02-18 NOTE — Progress Notes (Signed)
HPI  [name] is a 65 y.o. 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 He is status post PCI with modest depression of LV function. His most recent ECHO cardiogram in October 2010 demonstrated 1. Left ventricle: The cavity size was normal. Wall thickness was  normal. Systolic function was moderately to severely reduced. The  estimated ejection fraction was in the range of 30% to 35%.  Diffuse hypokinesis. There is severe hypokinesis of the basal-mid  inferoposteriormyocardium.  A Myoview scan in 2008 demonstrated inferior posterior wall MI but no ischemia.  He has permanent atrial fibrillation.   He has recently been shocked in a properly for atrial fibrillation. This has happened a number of occasions previously. He also recounts exceeding exercise intolerance shortness of breath.  He has problems with atypical pains lasting seconds in both his chest as well as his leg. He also describes a "bubbly sensation in his head his fingers in his legs  Past Medical History  Diagnosis Date  . PERCUTANEOUS TRANSLUMINAL CORONARY ANGIOPLASTY, HX OF 09/22/2008  . TONSILLECTOMY, HX OF 09/22/2008  . CATARACT EXTRACTION, HX OF 09/22/2008  . NEPHROLITHIASIS, HX OF 02/25/2008  . CALLUS, RIGHT FOOT 12/08/2009  . SYSTOLIC HEART FAILURE, CHRONIC 11/03/2009  . Atrial fibrillation 11/25/2006  . ISCHEMIC CARDIOMYOPATHY 11/25/2006  . CORONARY ARTERY DISEASE 11/25/2006  . MYOCARDIAL INFARCTION, HX OF 09/22/2008  . HYPERLIPIDEMIA 11/25/2006  . HYPOGONADISM, MALE 12/05/2006  . 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 DIABETES MELLITUS, TYPE II 12/05/2006  . 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 Encounter for long-term (current) use of anticoagulants 04/25/2010    Past Surgical History  Procedure Date  . Cataract extraction   . Tonsillectomy   . Cardiac defibrillator placement     Guidant Vitality [name]  . Ptca     Current Outpatient  Prescriptions  Medication Sig Dispense Refill  . aspirin EC 81 MG tablet Take 81 mg by mouth daily.        Marland Kitchen desoximetasone (TOPICORT) 0.05 % cream Apply 1 application topically 2 (two) times daily.        Marland Kitchen lisinopril (PRINIVIL,ZESTRIL) 5 MG tablet TAKE ONE TABLET (5MG ) BY MOUTH DAILY  30 tablet  12  . metFORMIN (GLUCOPHAGE) 1000 MG tablet TAKE ONE TABLET TWICE DAILY FOR DIABETES  60 tablet  11  . metoprolol (TOPROL-XL) 100 MG 24 hr tablet Take 100 mg by mouth daily.       . Multiple Vitamin (MULTIVITAMIN) tablet Take 1 tablet by mouth daily.        . nitroGLYCERIN (NITROSTAT) 0.4 MG SL tablet Place 1 tablet (0.4 mg total) under the tongue every 5 (five) minutes as needed.  25 tablet  11  . sildenafil (VIAGRA) 100 MG tablet Take 100 mg by mouth daily as needed.        . Tamsulosin HCl (FLOMAX) 0.4 MG CAPS Take 0.4 mg by mouth as needed.       . Testosterone 20.25 MG/ACT (1.62%) GEL Apply 2 drops topically every other day.        . vitamin B-12 (CYANOCOBALAMIN) 500 MCG tablet Take 500 mcg by mouth daily.        Marland Kitchen warfarin (COUMADIN) 2.5 MG tablet Take as directed by Anticoagulation clinic   100 tablet  0  . zolpidem (AMBIEN) 10 MG tablet TAKE 1 TABLET AT BEDTIME AS NEEDED FOR  SLEEP  30 tablet  5  . DISCONTD: nitroGLYCERIN (  NITROSTAT) 0.4 MG SL tablet Place 0.4 mg under the tongue every 5 (five) minutes as needed.          Allergies  Allergen Reactions  . Salmon     Review of Systems negative except from HPI and PMH  Physical Exam Well developed and well nourished in no acute distress HENT normal E scleral and icterus clear Neck Supple JVP flat; carotids brisk and full Clear to ausculation irregularly irregular  no murmurs gallops or rub Soft with active bowel sounds No clubbing cyanosis none Edema Alert and oriented, grossly normal motor and sensory function Skin Warm and Dry   Assessment and  Plan

## 2011-02-18 NOTE — Assessment & Plan Note (Signed)
The patient has permanent atrial fibrillation. This is true notwithstanding the left atrial dimension a year ago only 4 cm. He has had 5 ICD shocks. Review of his atrial fibrillation histogram demonstrates rapid ventricular response and peak heart rates in the 150-200 range.He has not tolerated higher doses of metoprolol succinate, so we will discontinue it and put him on metoprolol tartrate at 150 mg twice daily. We discussed alternative strategies including consideration of adjuvant therapy notwithstanding the chronicity base of the left atrial dimension AV junction ablation or consideration of referral for convergent or Endo vascular ablation

## 2011-02-18 NOTE — Patient Instructions (Signed)
Your physician recommends that you schedule a follow-up appointment in: 4 weeks with device clinic and 3 months with Dr Graciela Husbands   Your physician has recommended you make the following change in your medication:  1) STOP Metoprolol Succ 2) Start  Metoprolol Tarr 150mg  twice daily 3) Stop Aspirin

## 2011-02-28 ENCOUNTER — Telehealth: Payer: Self-pay | Admitting: Internal Medicine

## 2011-02-28 DIAGNOSIS — I4891 Unspecified atrial fibrillation: Secondary | ICD-10-CM

## 2011-02-28 NOTE — Telephone Encounter (Signed)
New Problem:     Patient while taking metoprolol (LOPRESSOR) 100 MG tablet suffered from extreme depression.  Has stopped taking the medication and would like to speak with someone about how to proceed.  Please advise.

## 2011-02-28 NOTE — Telephone Encounter (Signed)
Spoke with patient earlier and patient states that he can not continue the current Metoprolol tartrate 100 mg 1 & 1/2 tablet twice daily.  Discussed with Dr Johney Frame and he preferred Dr Graciela Husbands make this decision.  Will forward to Dr Graciela Husbands and wait for reply.

## 2011-02-28 NOTE — Telephone Encounter (Signed)
Left message

## 2011-03-02 NOTE — Telephone Encounter (Signed)
Follow-up:   Patient has called again to follow up on his initial call. Please advise.

## 2011-03-02 NOTE — Telephone Encounter (Signed)
Juliette Alcide spoke to me about this. Message was routed to Dr. Graciela Husbands on 12/31, but not on MD desktop. I will re-route, the patient discontinued lopressor 100mg  1 &1/2 tablets twice daily due to extreme "depression." He was only fatigued on toprol 100mg  once daily. Will resend to Dr. Graciela Husbands for review and recommendations.

## 2011-03-02 NOTE — Telephone Encounter (Signed)
Per Dr. Graciela Husbands, change to toprol 100mg  1 & 1/2 tablets by mouth once daily. The patient is aware and is agreeable. His feelings of depression are gone.

## 2011-03-08 ENCOUNTER — Telehealth: Payer: Self-pay | Admitting: Internal Medicine

## 2011-03-08 NOTE — Telephone Encounter (Signed)
New Msg: pt calling wanting to discuss with nurse/Md about pt device and possibly changing it out. Please return pt call to discuss further.

## 2011-03-08 NOTE — Telephone Encounter (Signed)
Spoke with patient about need for rate control the options incl antiarrhtyhmic drug with either tikosyn or amiodarone, RFCA with or without convergence and AV nodal ablation and upgrade to CRT.  He will review these on line and get back to [place]  [name]

## 2011-03-15 ENCOUNTER — Encounter: Payer: BC Managed Care – PPO | Admitting: *Deleted

## 2011-03-16 ENCOUNTER — Ambulatory Visit (INDEPENDENT_AMBULATORY_CARE_PROVIDER_SITE_OTHER): Payer: BC Managed Care – PPO | Admitting: *Deleted

## 2011-03-16 ENCOUNTER — Encounter: Payer: Self-pay | Admitting: Internal Medicine

## 2011-03-16 DIAGNOSIS — Z7901 Long term (current) use of anticoagulants: Secondary | ICD-10-CM

## 2011-03-16 DIAGNOSIS — I4891 Unspecified atrial fibrillation: Secondary | ICD-10-CM

## 2011-03-16 DIAGNOSIS — I2589 Other forms of chronic ischemic heart disease: Secondary | ICD-10-CM

## 2011-03-16 DIAGNOSIS — I5022 Chronic systolic (congestive) heart failure: Secondary | ICD-10-CM

## 2011-03-16 LAB — ICD DEVICE OBSERVATION
AL AMPLITUDE: 1.75 mv
AL IMPEDENCE ICD: 589 Ohm
ATRIAL PACING ICD: 0.03 pct
BATTERY VOLTAGE: 3.0435 v
CHARGE TIME: 5.455 s
DEV-0020ICD: NEGATIVE
FVT: 0
PACEART VT: 0
RV LEAD AMPLITUDE: 24.125 mv
RV LEAD IMPEDENCE ICD: 475 Ohm
TOT-0001: 2
TOT-0002: 0
TOT-0006: 20110228000000
TZAT-0001ATACH: 1
TZAT-0001ATACH: 2
TZAT-0001ATACH: 3
TZAT-0001FASTVT: 1
TZAT-0001SLOWVT: 1
TZAT-0002ATACH: NEGATIVE
TZAT-0002ATACH: NEGATIVE
TZAT-0002ATACH: NEGATIVE
TZAT-0002FASTVT: NEGATIVE
TZAT-0004SLOWVT: 8
TZAT-0005SLOWVT: 88 pct
TZAT-0011SLOWVT: 10 ms
TZAT-0012ATACH: 150 ms
TZAT-0012ATACH: 150 ms
TZAT-0012ATACH: 150 ms
TZAT-0012FASTVT: 200 ms
TZAT-0012SLOWVT: 200 ms
TZAT-0013SLOWVT: 3
TZAT-0018ATACH: NEGATIVE
TZAT-0018ATACH: NEGATIVE
TZAT-0018ATACH: NEGATIVE
TZAT-0018FASTVT: NEGATIVE
TZAT-0018SLOWVT: NEGATIVE
TZAT-0019ATACH: 6 v
TZAT-0019ATACH: 6 v
TZAT-0019ATACH: 6 v
TZAT-0019FASTVT: 8 v
TZAT-0019SLOWVT: 8 v
TZAT-0020ATACH: 1.5 ms
TZAT-0020ATACH: 1.5 ms
TZAT-0020ATACH: 1.5 ms
TZAT-0020FASTVT: 1.5 ms
TZAT-0020SLOWVT: 1.5 ms
TZON-0003ATACH: 350 ms
TZON-0003FASTVT: 250 ms
TZON-0003SLOWVT: 280 ms
TZON-0003VSLOWVT: 450 ms
TZON-0004SLOWVT: 40
TZON-0004VSLOWVT: 20
TZON-0005SLOWVT: 16
TZON-0010FASTVT: 30 ms
TZON-0010SLOWVT: 30 ms
TZST-0001ATACH: 4
TZST-0001ATACH: 5
TZST-0001ATACH: 6
TZST-0001FASTVT: 2
TZST-0001FASTVT: 3
TZST-0001FASTVT: 4
TZST-0001FASTVT: 5
TZST-0001FASTVT: 6
TZST-0001SLOWVT: 2
TZST-0001SLOWVT: 3
TZST-0001SLOWVT: 4
TZST-0001SLOWVT: 5
TZST-0001SLOWVT: 6
TZST-0002ATACH: NEGATIVE
TZST-0002ATACH: NEGATIVE
TZST-0002ATACH: NEGATIVE
TZST-0002FASTVT: NEGATIVE
TZST-0002FASTVT: NEGATIVE
TZST-0002FASTVT: NEGATIVE
TZST-0002FASTVT: NEGATIVE
TZST-0002FASTVT: NEGATIVE
TZST-0003SLOWVT: 20 j
TZST-0003SLOWVT: 35 j
TZST-0003SLOWVT: 35 j
TZST-0003SLOWVT: 35 j
TZST-0003SLOWVT: 35 j
VENTRICULAR PACING ICD: 28.54 pct
VF: 0

## 2011-03-16 LAB — POCT INR: INR: 2.2

## 2011-03-16 NOTE — Progress Notes (Signed)
ICD check with ICM 

## 2011-04-06 ENCOUNTER — Other Ambulatory Visit: Payer: Self-pay | Admitting: *Deleted

## 2011-04-06 MED ORDER — SILDENAFIL CITRATE 100 MG PO TABS: 100.0000 mg | ORAL_TABLET | Freq: Every day | ORAL | Status: DC | PRN

## 2011-04-10 ENCOUNTER — Encounter (HOSPITAL_COMMUNITY): Payer: Self-pay | Admitting: *Deleted

## 2011-04-10 ENCOUNTER — Emergency Department (HOSPITAL_COMMUNITY)
Admission: EM | Admit: 2011-04-10 | Discharge: 2011-04-10 | Disposition: A | Discharge status: Home / Self Care | Payer: BC Managed Care – PPO | Source: Home / Self Care

## 2011-04-10 ENCOUNTER — Emergency Department (INDEPENDENT_AMBULATORY_CARE_PROVIDER_SITE_OTHER): Payer: BC Managed Care – PPO

## 2011-04-10 DIAGNOSIS — B349 Viral infection, unspecified: Secondary | ICD-10-CM

## 2011-04-10 DIAGNOSIS — B9789 Other viral agents as the cause of diseases classified elsewhere: Secondary | ICD-10-CM

## 2011-04-10 MED ORDER — ACETAMINOPHEN 325 MG PO TABS
975.0000 mg | ORAL_TABLET | Freq: Once | ORAL | Status: AC
  Administered 2011-04-10: 975 mg via ORAL

## 2011-04-10 MED ORDER — ACETAMINOPHEN 325 MG PO TABS
ORAL_TABLET | ORAL | Status: AC
  Filled 2011-04-10: qty 3

## 2011-04-10 NOTE — ED Provider Notes (Signed)
History     CSN: [id number]  Arrival date & time 04/10/11  1842   None     Chief Complaint  Patient presents with  . Sore Throat  . Fever  . Chills  . Neck Pain  . Nasal Congestion    (Consider location/radiation/quality/duration/timing/severity/associated sxs/prior treatment) HPI Comments: Pt has been sick with cold sx for a week, taking otc cold meds. Yesterday felt feverish, chills, today feels worse. Has not taken temp at home.  Took bp at home this morning and it was low for him (66/50 he thinks). Has been dizzy on and off today.   Patient is a 66 y.o. male presenting with pharyngitis and fever. The history is provided by the patient.  Sore Throat This is a new problem. The current episode started yesterday. The problem occurs constantly. The problem has not changed since onset.Associated symptoms include chest pain. Pertinent negatives include no shortness of breath. The symptoms are aggravated by nothing. The symptoms are relieved by nothing.  Fever Primary symptoms of the febrile illness include fever and cough. Primary symptoms do not include shortness of breath. The current episode started yesterday.  The fever began yesterday. The fever has been unchanged since its onset. The maximum temperature recorded prior to his arrival was unknown.  The cough began 3 to 5 days ago. The cough is productive. The sputum is green.    Past Medical History  Diagnosis Date  . Ischemic cardiomyopathy 09/22/2008    DES CX and RCA  70% residual LAD  Done in [place]; EF 30%;; myoviewq 2008 no ischemia  . Implantable cardiac defibrillator MDT 09/22/2008    Change out feb 2011  . NEPHROLITHIASIS, HX OF 02/25/2008  . CALLUS, RIGHT FOOT 12/08/2009  . Chronic systolic heart failure 11/03/2009  . Atrial fibrillation 11/25/2006    Inapp shock 2012 AF VR >220 recurrent  . HYPERLIPIDEMIA 11/25/2006  . HYPOGONADISM, MALE 12/05/2006  . 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 DIABETES MELLITUS, TYPE  II 12/05/2006  . 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 Encounter for long-term (current) use of anticoagulants 04/25/2010    Past Surgical History  Procedure Date  . Cataract extraction   . Tonsillectomy   . Cardiac defibrillator placement     Guidant Vitality [name]  . Ptca     History reviewed. No pertinent family history.  History  Substance Use Topics  . Smoking status: Never Smoker   . Smokeless tobacco: Never Used  . Alcohol Use: Not on file      Review of Systems  Constitutional: Positive for fever and chills.  HENT: Positive for congestion, sore throat, rhinorrhea and neck pain. Negative for ear pain.   Respiratory: Positive for cough. Negative for shortness of breath.   Cardiovascular: Positive for chest pain.    Allergies  Metoprolol tartrate and Salmon  Home Medications   Current Outpatient Rx  Name Route Sig Dispense Refill  . LISINOPRIL 5 MG PO TABS  TAKE ONE TABLET (5MG ) BY MOUTH DAILY 30 tablet 12  . METFORMIN HCL 1000 MG PO TABS  TAKE ONE TABLET TWICE DAILY FOR DIABETES 60 tablet 11    COULD YOU WRITE Rx  FOR #100 WE SELL #100/ $10.00T ...  . OVER THE COUNTER MEDICATION  Cold medications    . WARFARIN SODIUM 2.5 MG PO TABS  Take as directed by Anticoagulation clinic  100 tablet 0  . ZOLPIDEM TARTRATE 10 MG PO TABS  TAKE 1 TABLET AT BEDTIME AS NEEDED  FOR  SLEEP 30 tablet 5  . DESOXIMETASONE 0.05 % EX CREA Topical Apply 1 application topically 2 (two) times daily.      Marland Kitchen METOPROLOL SUCCINATE ER 100 MG PO TB24  Take 1 & 1/2 tablets by mouth once daily.    Marland Kitchen ONE-DAILY MULTI VITAMINS PO TABS Oral Take 1 tablet by mouth daily.      Marland Kitchen NITROGLYCERIN 0.4 MG SL SUBL Sublingual Place 1 tablet (0.4 mg total) under the tongue every 5 (five) minutes as needed. 25 tablet 11  . SILDENAFIL CITRATE 100 MG PO TABS Oral Take 1 tablet (100 mg total) by mouth daily as needed. 10 tablet 0  . TAMSULOSIN HCL 0.4 MG PO CAPS Oral Take 0.4 mg by mouth as needed.     .  TESTOSTERONE 20.25 MG/ACT (1.62%) TD GEL Topical Apply 2 drops topically every other day.      Marland Kitchen VITAMIN B-12 500 MCG PO TABS Oral Take 500 mcg by mouth daily.        BP 90/56  Pulse 61  Temp(Src) 102 F (38.9 C) (Oral)  Resp 18  SpO2 97%  Physical Exam  Constitutional: He appears well-developed and well-nourished. No distress.  HENT:  Right Ear: Tympanic membrane, external ear and ear canal normal.  Left Ear: Tympanic membrane, external ear and ear canal normal.  Nose: Mucosal edema and rhinorrhea present.  Mouth/Throat: Oropharynx is clear and moist.       Tonsils absent  Neck: Normal range of motion. Neck supple.  Cardiovascular: Normal rate.  An irregularly irregular rhythm present.  Pulmonary/Chest: Effort normal and breath sounds normal. No respiratory distress.       Occasional cough  Lymphadenopathy:       Head (right side): No submandibular adenopathy present.       Head (left side): No submandibular adenopathy present.    He has no cervical adenopathy.  Skin: Skin is warm and dry.    ED Course  Procedures (including critical care time)   Labs Reviewed  POCT RAPID STREP A ([facility] CARE ONLY)  POCT RAPID STREP A ([facility] CARE ONLY)   Dg Chest 2 View  04/10/2011  *RADIOLOGY REPORT*  Clinical Data: Sore throat, fever, chills.  Productive cough and fever.  CHEST - 2 VIEW  Comparison: Two-view chest 04/27/2009.  Findings: A pacemaker / AICD is stable in position.  Heart size is normal.  Emphysematous changes are noted.  Fusion cross anterior osteophytes in the thoracic spine is stable.  IMPRESSION:  1.  Mild emphysema. 2.  No acute cardiopulmonary disease or significant interval change.  Original Report Authenticated By: Jamesetta Orleans. MATTERN, M.D.     1. Viral infection    Pt neg for strep or pneumonia.  Received flu shot this year.  This is likely viral infection, I have no other source for fever and pt has other upper respiratory sx.  However, pt reports dizziness  on occasion, low bp at home this morning. Pt lives alone, and has multiple health problems.  I recommended transfer to ER. Pt refuses.  States feels better after tylenol dose here in [facility] and will monitor self at home.  Is unconcerned about low bp and dizziness. I explained at length my concerns, pt still elects to go home.  Pt does agree to have someone check on him at home, and to f/u with pcp in the morning.    MDM          Cathlyn Parsons, NP  04/10/11 2102 

## 2011-04-10 NOTE — ED Notes (Signed)
Pt with c/o fever/chills/congestion/sore throat and left side of neck sore onset of symptoms x one week felt better yesterday today feeling worse

## 2011-04-10 NOTE — ED Notes (Signed)
Pt signed out ama did not want to go to ed - feels better - has appt to see dr Everardo All tomorrow

## 2011-04-11 ENCOUNTER — Ambulatory Visit (INDEPENDENT_AMBULATORY_CARE_PROVIDER_SITE_OTHER): Payer: BC Managed Care – PPO | Admitting: Internal Medicine

## 2011-04-11 ENCOUNTER — Other Ambulatory Visit (INDEPENDENT_AMBULATORY_CARE_PROVIDER_SITE_OTHER): Payer: BC Managed Care – PPO

## 2011-04-11 ENCOUNTER — Encounter: Payer: Self-pay | Admitting: Internal Medicine

## 2011-04-11 DIAGNOSIS — J069 Acute upper respiratory infection, unspecified: Secondary | ICD-10-CM

## 2011-04-11 DIAGNOSIS — E785 Hyperlipidemia, unspecified: Secondary | ICD-10-CM

## 2011-04-11 DIAGNOSIS — E119 Type 2 diabetes mellitus without complications: Secondary | ICD-10-CM

## 2011-04-11 DIAGNOSIS — Z209 Contact with and (suspected) exposure to unspecified communicable disease: Secondary | ICD-10-CM

## 2011-04-11 LAB — HEPATIC FUNCTION PANEL
ALT: 21 U/L (ref 0–53)
AST: 18 U/L (ref 0–37)
Total Bilirubin: 0.9 mg/dL (ref 0.3–1.2)
Total Protein: 7.6 g/dL (ref 6.0–8.3)

## 2011-04-11 LAB — COMPREHENSIVE METABOLIC PANEL
AST: 18 U/L (ref 0–37)
Albumin: 3.9 g/dL (ref 3.5–5.2)
Alkaline Phosphatase: 69 U/L (ref 39–117)
Potassium: 4 mEq/L (ref 3.5–5.1)
Sodium: 134 mEq/L — ABNORMAL LOW (ref 135–145)
Total Bilirubin: 0.9 mg/dL (ref 0.3–1.2)
Total Protein: 7.6 g/dL (ref 6.0–8.3)

## 2011-04-11 LAB — LIPID PANEL
HDL: 36.6 mg/dL — ABNORMAL LOW (ref >39.00)
LDL Cholesterol: 82 mg/dL (ref 0–99)
Total CHOL/HDL Ratio: 4
VLDL: 25.4 mg/dL (ref 0.0–40.0)

## 2011-04-11 LAB — HEMOGLOBIN A1C: Hgb A1c MFr Bld: 7.2 % — ABNORMAL HIGH (ref 4.6–6.5)

## 2011-04-11 MED ORDER — CEFUROXIME AXETIL 250 MG PO TABS: 250.0000 mg | ORAL_TABLET | Freq: Two times a day (BID) | ORAL | Status: AC

## 2011-04-11 NOTE — ED Provider Notes (Signed)
Medical screening examination/treatment/procedure(s) were performed by non-physician practitioner and as supervising physician I was immediately available for consultation/collaboration.   Barkley Bruns MD.    Barkley Bruns, MD 04/11/11 [id number]

## 2011-04-11 NOTE — Progress Notes (Signed)
  Subjective:    Patient ID: [name], male    DOB: 1945-11-14, 66 y.o.   MRN: [id number]  HPI Mr. Gibbon- presents with a 10 day h/o cold, cough - productive of a green sputum, he has had rigors and chills. Her went to urgent care - T 102, hypotensive. Note reviewed: Ms. Vassie Moment NP saw him - working diagnosis was viral infection - no influenza screen, no lab work. CHest x-ray was negative.   He is sexually active and has a history of risk taking behavior in this regard with possible exposure to STDs. He wishes to have screening for STDs: syphllis, GC, chlamydia and HIV.   Review of Systems System review is negative for any constitutional, cardiac, pulmonary, GI or neuro symptoms or complaints other than as described in the HPI.     Objective:   Physical Exam Filed Vitals:   04/11/11 1547  BP: 90/60  Pulse: 71  Temp: 96.9 F (36.1 C)  Resp: 14  Weight: 181 lb (82.101 kg)  Gen'l - NWWD white man in no distress HEENT - EACs/ TM's normal Neck - supple, Nodes - negative cervical Pulm - normal respirations, CTAP Cor - RRR        Assessment & Plan:  URI - most likely viral, ?flu-like illness, but with persistent symptoms 10 days out concern for bacterial superinfection. Plan - ceftin 250 bid x 5           Supportive care.   STD - screening labs ordered.

## 2011-04-11 NOTE — Patient Instructions (Signed)
Upper respiratory symptoms with persistent cough and feeling ill suggests what started off as a viral, influenza like illness, now with possible super (on top of ) infection. Plan - ceftin 250 mg twice a day for 5 days.           Continue cough syrup of choice           Comfort measures - tylenol 500 - 1000 mg three times a day for fever, aches           Continue loratadine for drippy nose           Hydrate, hydrate, hydrate           Rest   Upper Respiratory Infection, Adult An upper respiratory infection (URI) is also sometimes known as the common cold. The upper respiratory tract includes the nose, sinuses, throat, trachea, and bronchi. Bronchi are the airways leading to the lungs. Most people improve within 1 week, but symptoms can last up to 2 weeks. A residual cough may last even longer.   CAUSES Many different viruses can infect the tissues lining the upper respiratory tract. The tissues become irritated and inflamed and often become very moist. Mucus production is also common. A cold is contagious. You can easily spread the virus to others by oral contact. This includes kissing, sharing a glass, coughing, or sneezing. Touching your mouth or nose and then touching a surface, which is then touched by another person, can also spread the virus. SYMPTOMS   Symptoms typically develop 1 to 3 days after you come in contact with a cold virus. Symptoms vary from person to person. They may include:  Runny nose.     Sneezing.    Nasal congestion.     Sinus irritation.     Sore throat.     Loss of voice (laryngitis).     Cough.    Fatigue.    Muscle aches.     Loss of appetite.     Headache.    Low-grade fever.  DIAGNOSIS   You might diagnose your own cold based on familiar symptoms, since most people get a cold 2 to 3 times a year. Your caregiver can confirm this based on your exam. Most importantly, your caregiver can check that your symptoms are not due to another disease such  as strep throat, sinusitis, pneumonia, asthma, or epiglottitis. Blood tests, throat tests, and X-rays are not necessary to diagnose a common cold, but they may sometimes be helpful in excluding other more serious diseases. Your caregiver will decide if any further tests are required. RISKS AND COMPLICATIONS   You may be at risk for a more severe case of the common cold if you smoke cigarettes, have chronic heart disease (such as heart failure) or lung disease (such as asthma), or if you have a weakened immune system. The very young and very old are also at risk for more serious infections. Bacterial sinusitis, middle ear infections, and bacterial pneumonia can complicate the common cold. The common cold can worsen asthma and chronic obstructive pulmonary disease (COPD). Sometimes, these complications can require emergency medical care and may be life-threatening. PREVENTION   The best way to protect against getting a cold is to practice good hygiene. Avoid oral or hand contact with people with cold symptoms. Wash your hands often if contact occurs. There is no clear evidence that vitamin C, vitamin E, echinacea, or exercise reduces the chance of developing a cold. However, it is always recommended to get  plenty of rest and practice good nutrition. TREATMENT   Treatment is directed at relieving symptoms. There is no cure. Antibiotics are not effective, because the infection is caused by a virus, not by bacteria. Treatment may include:  Increased fluid intake. Sports drinks offer valuable electrolytes, sugars, and fluids.     Breathing heated mist or steam (vaporizer or shower).     Eating chicken soup or other clear broths, and maintaining good nutrition.     Getting plenty of rest.     Using gargles or lozenges for comfort.     Controlling fevers with ibuprofen or acetaminophen as directed by your caregiver.     Increasing usage of your inhaler if you have asthma.  Zinc gel and zinc lozenges,  taken in the first 24 hours of the common cold, can shorten the duration and lessen the severity of symptoms. Pain medicines may help with fever, muscle aches, and throat pain. A variety of non-prescription medicines are available to treat congestion and runny nose. Your caregiver can make recommendations and may suggest nasal or lung inhalers for other symptoms.   HOME CARE INSTRUCTIONS    Only take over-the-counter or prescription medicines for pain, discomfort, or fever as directed by your caregiver.     Use a warm mist humidifier or inhale steam from a shower to increase air moisture. This may keep secretions moist and make it easier to breathe.     Drink enough water and fluids to keep your urine clear or pale yellow.     Rest as needed.     Return to work when your temperature has returned to normal or as your caregiver advises. You may need to stay home longer to avoid infecting others. You can also use a face mask and careful hand washing to prevent spread of the virus.  SEEK MEDICAL CARE IF:    After the first few days, you feel you are getting worse rather than better.     You need your caregiver's advice about medicines to control symptoms.     You develop chills, worsening shortness of breath, or brown or red sputum. These may be signs of pneumonia.     You develop yellow or brown nasal discharge or pain in the face, especially when you bend forward. These may be signs of sinusitis.     You develop a fever, swollen neck glands, pain with swallowing, or white areas in the back of your throat. These may be signs of strep throat.  SEEK IMMEDIATE MEDICAL CARE IF:    You have a fever.     You develop severe or persistent headache, ear pain, sinus pain, or chest pain.     You develop wheezing, a prolonged cough, cough up blood, or have a change in your usual mucus (if you have chronic lung disease).     You develop sore muscles or a stiff neck.  Document Released: 08/10/2000  Document Revised: 10/27/2010 Document Reviewed: 06/18/2010 [facility] Patient Information 2012 [place], [place].

## 2011-04-12 ENCOUNTER — Ambulatory Visit (INDEPENDENT_AMBULATORY_CARE_PROVIDER_SITE_OTHER): Payer: BC Managed Care – PPO | Admitting: *Deleted

## 2011-04-12 DIAGNOSIS — I4891 Unspecified atrial fibrillation: Secondary | ICD-10-CM

## 2011-04-12 DIAGNOSIS — Z7901 Long term (current) use of anticoagulants: Secondary | ICD-10-CM

## 2011-04-12 LAB — HIV ANTIBODY (ROUTINE TESTING W REFLEX): HIV: NONREACTIVE

## 2011-04-12 LAB — RPR

## 2011-04-12 LAB — POCT INR: INR: 1.7

## 2011-04-17 ENCOUNTER — Encounter: Payer: Self-pay | Admitting: Internal Medicine

## 2011-04-18 ENCOUNTER — Encounter: Payer: Self-pay | Admitting: Internal Medicine

## 2011-04-18 ENCOUNTER — Ambulatory Visit (INDEPENDENT_AMBULATORY_CARE_PROVIDER_SITE_OTHER): Payer: BC Managed Care – PPO | Admitting: Internal Medicine

## 2011-04-18 VITALS — BP 96/62 | HR 60 | Temp 96.8°F | Resp 14 | Ht 71.25 in | Wt 179.4 lb

## 2011-04-18 DIAGNOSIS — Z0000 Encounter for general adult medical examination without abnormal findings: Secondary | ICD-10-CM

## 2011-04-18 DIAGNOSIS — Z95810 Presence of automatic (implantable) cardiac defibrillator: Secondary | ICD-10-CM

## 2011-04-18 DIAGNOSIS — I5022 Chronic systolic (congestive) heart failure: Secondary | ICD-10-CM

## 2011-04-18 DIAGNOSIS — E119 Type 2 diabetes mellitus without complications: Secondary | ICD-10-CM

## 2011-04-18 DIAGNOSIS — Z1211 Encounter for screening for malignant neoplasm of colon: Secondary | ICD-10-CM

## 2011-04-18 DIAGNOSIS — E785 Hyperlipidemia, unspecified: Secondary | ICD-10-CM

## 2011-04-18 DIAGNOSIS — I4891 Unspecified atrial fibrillation: Secondary | ICD-10-CM

## 2011-04-19 ENCOUNTER — Encounter: Payer: Self-pay | Admitting: Internal Medicine

## 2011-04-19 DIAGNOSIS — Z0000 Encounter for general adult medical examination without abnormal findings: Secondary | ICD-10-CM | POA: Insufficient documentation

## 2011-04-19 NOTE — Assessment & Plan Note (Signed)
A1C @ 7.2 % is close to goal of 7% or less.  Plan - no change in medication           Improved life-style management: better diet and regular exercise.

## 2011-04-19 NOTE — Progress Notes (Signed)
Subjective:    Patient ID: [name], male    DOB: [birth year], 66 y.o.   MRN: [id number]  HPI [name] presents for routine medical exam. At today's visit he is feeling well with no active complaints. He is independent in all ADLs. He has smoke alarms, wears his seatbelt, does not have firearms. He has no regular exercise program although he remains active. He does practice safe-sex. He is cognitively intact without an problems carrying out daily activities of business and his personal affairs.  Past Medical History  Diagnosis Date  . Ischemic cardiomyopathy 09/22/2008    DES CX and RCA  70% residual LAD  Done in [place]; EF 30%;; myoviewq 2008 no ischemia  . Implantable cardiac defibrillator MDT 09/22/2008    Change out feb 2011  . NEPHROLITHIASIS, HX OF 02/25/2008  . CALLUS, RIGHT FOOT 12/08/2009  . Chronic systolic heart failure 11/03/2009  . Atrial fibrillation 11/25/2006    Inapp shock 2012 AF VR >220 recurrent  . HYPERLIPIDEMIA 11/25/2006  . HYPOGONADISM, MALE 12/05/2006  . 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 DIABETES MELLITUS, TYPE II 12/05/2006  . 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 Encounter for long-term (current) use of anticoagulants 04/25/2010   Past Surgical History  Procedure Date  . Cataract extraction   . Tonsillectomy   . Cardiac defibrillator placement     Guidant Vitality [name]  . Ptca    Family History  Problem Relation Age of Onset  . Diabetes Maternal Grandmother   . Hyperlipidemia Maternal Grandmother   . Cancer Neg Hx   . COPD Neg Hx   . Heart disease Neg Hx    History   Social History  . Marital Status: Divorced    Spouse Name: N/A    Number of Children: 1  . Years of Education: 16   Occupational History  . 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 Smoking status: Never Smoker   . Smokeless tobacco: Never Used  . Alcohol Use: Yes  . Drug Use: No  . Sexually Active: Yes -- Male  partner(s)   Other Topics Concern  . Not on file   Social History Narrative   HSG, College grad. Married - divorced. 1 daughter. owner/operator electronics business and show production       Review of Systems Constitutional:  Negative for fever, chills, activity change and unexpected weight change.  HEENT:  Negative for hearing loss, ear pain, congestion, neck stiffness and postnasal drip. Negative for sore throat or swallowing problems. Negative for dental complaints.   Eyes: Negative for vision loss or change in visual acuity.  Respiratory: Negative for chest tightness and wheezing. Negative for DOE.   Cardiovascular: Negative for chest pain or palpitations. No decreased exercise tolerance Gastrointestinal: No change in bowel habit. No bloating or gas. No reflux or indigestion Genitourinary: Negative for urgency, frequency, flank pain and difficulty urinating.  Musculoskeletal: Negative for myalgias, back pain, arthralgias and gait problem.  Neurological: Negative for dizziness, tremors, weakness and headaches.  Hematological: Negative for adenopathy.  Psychiatric/Behavioral: Negative for behavioral problems and dysphoric mood.       Objective:   Physical Exam Filed Vitals:   04/18/11 1016  BP: 96/62  Pulse: 60  Temp: 96.8 F (36 C)  Resp: 14  Weight: 179 lb 6 oz (81.364 kg)  Body mass index is 24.84 kg/(m^2).  Gen'l: Well nourished well developed white male in no acute distress  HEENT: Head: Normocephalic and atraumatic. Right Ear: External ear normal. EAC/TM nl. Left Ear: External ear normal.  EAC/TM nl. Nose: Nose normal. Mouth/Throat: Oropharynx is clear and moist. Dentition - native, in good repair. No buccal or palatal lesions. Posterior pharynx clear. Eyes: Conjunctivae and sclera clear. EOM intact. Pupils are equal, round, and reactive to light. Right eye exhibits no discharge. Left eye exhibits no discharge. Neck: Normal range of motion. Neck supple. No JVD  present. No tracheal deviation present. No thyromegaly present.  Cardiovascular: Normal rate, regular rhythm, no gallop, no friction rub, no murmur heard.      Quiet precordium. 2+ radial and DP pulses . No carotid bruits. Cardiac device implanted left outer/upper chest wall - no erythema,swelling or tenderness Pulmonary/Chest: Effort normal. No respiratory distress or increased WOB, no wheezes, no rales. No chest wall deformity or CVAT. Abdominal: Soft. Bowel sounds are normal in all quadrants. He exhibits no distension, no tenderness, no rebound or guarding, No heptosplenomegaly  Genitourinary: deferred  Musculoskeletal: Normal range of motion. He exhibits no edema and no tenderness.       Small and large joints without redness, synovial thickening or deformity. Full range of motion preserved about all small, median and large joints.  Lymphadenopathy:    He has no cervical or supraclavicular adenopathy.  Neurological: He is alert and oriented to person, place, and time. CN II-XII intact. DTRs 2+ and symmetrical biceps, radial and patellar tendons. Cerebellar function normal with no tremor, rigidity, normal gait and station.  Skin: Skin is warm and dry. No rash noted. No erythema.  Psychiatric: He has a normal mood and affect. His behavior is normal. Thought content normal.   Lab Results  Component Value Date   WBC 7.1 04/21/2009   HGB 17.1* 04/21/2009   HCT 50.9 04/21/2009   PLT 153.0 04/21/2009   GLUCOSE 127* 04/11/2011   CHOL 144 04/11/2011   TRIG 127.0 04/11/2011   HDL 36.60* 04/11/2011   LDLDIRECT 59.4 01/12/2009   LDLCALC 82 04/11/2011        ALT 21 04/11/2011   AST 18 04/11/2011        NA 134* 04/11/2011   K 4.0 04/11/2011   CL 97 04/11/2011   CREATININE 1.1 04/11/2011   BUN 22 04/11/2011   CO2 27 04/11/2011   TSH 2.48 01/12/2009   PSA 0.39 01/12/2009   INR 1.7 04/12/2011   HGBA1C 7.2* 04/11/2011         Assessment & Plan:

## 2011-04-19 NOTE — Assessment & Plan Note (Signed)
Tolerating medical therapy. LDL (calc) is very close to goal of 80 or less without medication.  Plan - no indication for medication at this time.

## 2011-04-19 NOTE — Assessment & Plan Note (Signed)
Well compensated with no limitations in his activity.  Plan - continue present medications           Develop an aerobic exercise program.

## 2011-04-19 NOTE — Assessment & Plan Note (Signed)
stable regular rhythm on exam today.  Plan - routine follow-up with cardiology            Continue present medications

## 2011-04-19 NOTE — Assessment & Plan Note (Signed)
Follows closely with EP service. He is stable and doing well.

## 2011-04-19 NOTE — Assessment & Plan Note (Signed)
Interval medical history is stable. Physical exam is unremarkable. Lab results are good! He is due for colorectal cancer screening with colonoscopy - referral is made. Immunizations - due for pneumonia vaccine and Tdap.  In summary - a very nice man who does appear medically stable with a complex set of medical problems. He is encouraged to develop an exercise program. He will return in 6 months for follow-up.

## 2011-04-26 ENCOUNTER — Other Ambulatory Visit: Payer: Self-pay | Admitting: Internal Medicine

## 2011-05-03 ENCOUNTER — Ambulatory Visit (INDEPENDENT_AMBULATORY_CARE_PROVIDER_SITE_OTHER): Payer: BC Managed Care – PPO | Admitting: *Deleted

## 2011-05-03 DIAGNOSIS — I4891 Unspecified atrial fibrillation: Secondary | ICD-10-CM

## 2011-05-03 DIAGNOSIS — Z7901 Long term (current) use of anticoagulants: Secondary | ICD-10-CM

## 2011-05-18 ENCOUNTER — Encounter: Payer: Self-pay | Admitting: Internal Medicine

## 2011-05-18 ENCOUNTER — Ambulatory Visit (INDEPENDENT_AMBULATORY_CARE_PROVIDER_SITE_OTHER): Payer: BC Managed Care – PPO | Admitting: Internal Medicine

## 2011-05-18 VITALS — BP 110/62 | HR 65 | Temp 97.0°F | Resp 14 | Wt 180.0 lb

## 2011-05-18 DIAGNOSIS — L84 Corns and callosities: Secondary | ICD-10-CM

## 2011-05-18 DIAGNOSIS — J069 Acute upper respiratory infection, unspecified: Secondary | ICD-10-CM

## 2011-05-18 NOTE — Progress Notes (Signed)
SUBJECTIVE:  [name] is a 66 y.o. male who complains of congestion, sneezing, sore throat, post nasal drip and dry cough for 3 days. He denies a history of chest pain, dizziness, fevers and sweats and denies a history of asthma. Patient does not smoke cigarettes.   OBJECTIVE: He appears well, vital signs are as noted. Ears normal.  Throat and pharynx normal.  Neck supple. No adenopathy in the neck. Nose is congested. Sinuses non tender. The chest is clear, without wheezes or rales.  ASSESSMENT:  viral upper respiratory illness  PLAN: Symptomatic therapy suggested: push fluids, rest, gargle warm salt water, apply heat to sinuses prn and return office visit prn if symptoms persist or worsen. Lack of antibiotic effectiveness discussed with him. Call or return to clinic prn if these symptoms worsen or fail to improve as anticipated.  Second problem - large callus at the base of the right toe with some recent drainage.. On inspection - large callus with central erosion. At the periphery mild blister. No drainage.

## 2011-05-18 NOTE — Patient Instructions (Signed)
Viral URI - no evidence of a bacterial infection. Plan - supportive care: gargle of choice, tylenol for fever or chills, otc claritin or allegra (both come as generics), hydrate.  Callus on great toe - will refer to Dr. Deirdre Priest, podiatrist, requesting 1 st available.   Upper Respiratory Infection, Adult An upper respiratory infection (URI) is also sometimes known as the common cold. The upper respiratory tract includes the nose, sinuses, throat, trachea, and bronchi. Bronchi are the airways leading to the lungs. Most people improve within 1 week, but symptoms can last up to 2 weeks. A residual cough may last even longer.   CAUSES Many different viruses can infect the tissues lining the upper respiratory tract. The tissues become irritated and inflamed and often become very moist. Mucus production is also common. A cold is contagious. You can easily spread the virus to others by oral contact. This includes kissing, sharing a glass, coughing, or sneezing. Touching your mouth or nose and then touching a surface, which is then touched by another person, can also spread the virus. SYMPTOMS   Symptoms typically develop 1 to 3 days after you come in contact with a cold virus. Symptoms vary from person to person. They may include:  Runny nose.   Sneezing.   Nasal congestion.   Sinus irritation.   Sore throat.   Loss of voice (laryngitis).   Cough.   Fatigue.   Muscle aches.   Loss of appetite.   Headache.   Low-grade fever.  DIAGNOSIS   You might diagnose your own cold based on familiar symptoms, since most people get a cold 2 to 3 times a year. Your caregiver can confirm this based on your exam. Most importantly, your caregiver can check that your symptoms are not due to another disease such as strep throat, sinusitis, pneumonia, asthma, or epiglottitis. Blood tests, throat tests, and X-rays are not necessary to diagnose a common cold, but they may sometimes be helpful in excluding other  more serious diseases. Your caregiver will decide if any further tests are required. RISKS AND COMPLICATIONS   You may be at risk for a more severe case of the common cold if you smoke cigarettes, have chronic heart disease (such as heart failure) or lung disease (such as asthma), or if you have a weakened immune system. The very young and very old are also at risk for more serious infections. Bacterial sinusitis, middle ear infections, and bacterial pneumonia can complicate the common cold. The common cold can worsen asthma and chronic obstructive pulmonary disease (COPD). Sometimes, these complications can require emergency medical care and may be life-threatening. PREVENTION   The best way to protect against getting a cold is to practice good hygiene. Avoid oral or hand contact with people with cold symptoms. Wash your hands often if contact occurs. There is no clear evidence that vitamin C, vitamin E, echinacea, or exercise reduces the chance of developing a cold. However, it is always recommended to get plenty of rest and practice good nutrition. TREATMENT   Treatment is directed at relieving symptoms. There is no cure. Antibiotics are not effective, because the infection is caused by a virus, not by bacteria. Treatment may include:  Increased fluid intake. Sports drinks offer valuable electrolytes, sugars, and fluids.   Breathing heated mist or steam (vaporizer or shower).   Eating chicken soup or other clear broths, and maintaining good nutrition.   Getting plenty of rest.   Using gargles or lozenges for comfort.   Controlling  fevers with ibuprofen or acetaminophen as directed by your caregiver.   Increasing usage of your inhaler if you have asthma.  Zinc gel and zinc lozenges, taken in the first 24 hours of the common cold, can shorten the duration and lessen the severity of symptoms. Pain medicines may help with fever, muscle aches, and throat pain. A variety of non-prescription  medicines are available to treat congestion and runny nose. Your caregiver can make recommendations and may suggest nasal or lung inhalers for other symptoms.   HOME CARE INSTRUCTIONS    Only take over-the-counter or prescription medicines for pain, discomfort, or fever as directed by your caregiver.   Use a warm mist humidifier or inhale steam from a shower to increase air moisture. This may keep secretions moist and make it easier to breathe.   Drink enough water and fluids to keep your urine clear or pale yellow.   Rest as needed.   Return to work when your temperature has returned to normal or as your caregiver advises. You may need to stay home longer to avoid infecting others. You can also use a face mask and careful hand washing to prevent spread of the virus.  SEEK MEDICAL CARE IF:    After the first few days, you feel you are getting worse rather than better.   You need your caregiver's advice about medicines to control symptoms.   You develop chills, worsening shortness of breath, or brown or red sputum. These may be signs of pneumonia.   You develop yellow or brown nasal discharge or pain in the face, especially when you bend forward. These may be signs of sinusitis.   You develop a fever, swollen neck glands, pain with swallowing, or white areas in the back of your throat. These may be signs of strep throat.  SEEK IMMEDIATE MEDICAL CARE IF:    You have a fever.   You develop severe or persistent headache, ear pain, sinus pain, or chest pain.   You develop wheezing, a prolonged cough, cough up blood, or have a change in your usual mucus (if you have chronic lung disease).   You develop sore muscles or a stiff neck.  Document Released: 08/10/2000 Document Revised: 02/03/2011 Document Reviewed: 06/18/2010 [facility] Patient Information 2012 [employer], [place].

## 2011-05-20 ENCOUNTER — Encounter: Payer: Self-pay | Admitting: Internal Medicine

## 2011-05-20 ENCOUNTER — Ambulatory Visit (INDEPENDENT_AMBULATORY_CARE_PROVIDER_SITE_OTHER): Payer: BC Managed Care – PPO

## 2011-05-20 ENCOUNTER — Ambulatory Visit (INDEPENDENT_AMBULATORY_CARE_PROVIDER_SITE_OTHER): Payer: BC Managed Care – PPO | Admitting: Internal Medicine

## 2011-05-20 VITALS — BP 108/60 | HR 60 | Ht 71.0 in | Wt 180.5 lb

## 2011-05-20 DIAGNOSIS — I4891 Unspecified atrial fibrillation: Secondary | ICD-10-CM

## 2011-05-20 DIAGNOSIS — Z95810 Presence of automatic (implantable) cardiac defibrillator: Secondary | ICD-10-CM

## 2011-05-20 DIAGNOSIS — I5022 Chronic systolic (congestive) heart failure: Secondary | ICD-10-CM

## 2011-05-20 DIAGNOSIS — I2589 Other forms of chronic ischemic heart disease: Secondary | ICD-10-CM

## 2011-05-20 DIAGNOSIS — Z7901 Long term (current) use of anticoagulants: Secondary | ICD-10-CM

## 2011-05-20 LAB — ICD DEVICE OBSERVATION
BATTERY VOLTAGE: 3.0162 V
BRDY-0002RV: 60 {beats}/min
CHARGE TIME: 9.759 s
DEV-0020ICD: NEGATIVE
FVT: 0
PACEART VT: 0
RV LEAD AMPLITUDE: 27.125 mv
RV LEAD THRESHOLD: 0.75 V
TOT-0002: 0
TZAT-0001ATACH: 1
TZAT-0001FASTVT: 1
TZAT-0001SLOWVT: 1
TZAT-0002ATACH: NEGATIVE
TZAT-0002ATACH: NEGATIVE
TZAT-0002FASTVT: NEGATIVE
TZAT-0012ATACH: 150 ms
TZAT-0012FASTVT: 200 ms
TZAT-0012SLOWVT: 200 ms
TZAT-0018ATACH: NEGATIVE
TZAT-0018FASTVT: NEGATIVE
TZAT-0019ATACH: 6 V
TZAT-0019ATACH: 6 V
TZAT-0019SLOWVT: 8 V
TZAT-0020ATACH: 1.5 ms
TZAT-0020SLOWVT: 1.5 ms
TZON-0003SLOWVT: 280 ms
TZON-0010FASTVT: 30 ms
TZON-0010SLOWVT: 30 ms
TZST-0001ATACH: 5
TZST-0001FASTVT: 2
TZST-0001FASTVT: 4
TZST-0001FASTVT: 5
TZST-0001FASTVT: 6
TZST-0001SLOWVT: 2
TZST-0001SLOWVT: 4
TZST-0001SLOWVT: 6
TZST-0002ATACH: NEGATIVE
TZST-0002FASTVT: NEGATIVE
TZST-0002FASTVT: NEGATIVE
TZST-0002FASTVT: NEGATIVE
TZST-0003SLOWVT: 20 J
TZST-0003SLOWVT: 35 J
TZST-0003SLOWVT: 35 J
VENTRICULAR PACING ICD: 29.51 pct

## 2011-05-20 LAB — POCT INR: INR: 2.5

## 2011-05-20 NOTE — Patient Instructions (Addendum)
Remote monitoring is used to monitor your Pacemaker of ICD from home. This monitoring reduces the number of office visits required to check your device to one time per year. It allows [place] to keep an eye on the functioning of your device to ensure it is working properly. You are scheduled for a device check from home on August 25, 2011. You may send your transmission at any time that day. If you have a wireless device, the transmission will be sent automatically. After your physician reviews your transmission, you will receive a postcard with your next transmission date.  Call Dr. Odessa Fleming nurse, Herbert Seta, if you decide to initiate Tikosyn. [phone].

## 2011-05-20 NOTE — Assessment & Plan Note (Signed)
The patient's device was interrogated.  The information was reviewed. No changes were made in the programming.    

## 2011-05-20 NOTE — Assessment & Plan Note (Signed)
Euvolemic, otherwise As above

## 2011-05-20 NOTE — Assessment & Plan Note (Signed)
He is on guidelines right therapy; it could be also considered for aldosterone antagonist therapy.

## 2011-05-20 NOTE — Progress Notes (Signed)
HPI  [name] is a 66 y.o. male Seen in followup for aborted cardiac arrest for which he is status post ICD implantation. He is status post PCI with modest depression of LV function. His most recent ECHO cardiogram in October 2010 demonstrated 1. Left ventricle: The cavity size was normal. Wall thickness was  normal. Systolic function was moderately to severely reduced. The estimated ejection fraction was in the range of 30% to 35%.     A Myoview scan in 2008 demonstrated inferior posterior wall MI but no ischemia.  He has long-standing persistent atrial fibrillation. He remembers that after he got shocked appropriately for rapid atrial fibrillation he felt much better. We had discussed on numerous occasions options for the restoration of sinus rhythm with dofetilide or amiodarone. He has declined. He comes in today to discuss these issues again. He notes that he had a febrile illness a couple of months ago and he is gradually getting better     Past Medical History  Diagnosis Date  . Ischemic cardiomyopathy 09/22/2008    DES CX and RCA  70% residual LAD  Done in [place]; EF 30%;; myoviewq 2008 no ischemia  . Implantable cardiac defibrillator MDT 09/22/2008    Change out feb 2011  . NEPHROLITHIASIS, HX OF 02/25/2008  . CALLUS, RIGHT FOOT 12/08/2009  . Chronic systolic heart failure 11/03/2009  . Atrial fibrillation 11/25/2006    Inapp shock 2012 AF VR >220 recurrent  . HYPERLIPIDEMIA 11/25/2006  . HYPOGONADISM, MALE 12/05/2006  . 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 DIABETES MELLITUS, TYPE II 12/05/2006  . 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 Encounter for long-term (current) use of anticoagulants 04/25/2010    Past Surgical History  Procedure Date  . Cataract extraction   . Tonsillectomy   . Cardiac defibrillator placement     Guidant Vitality [name]  . Ptca     Current Outpatient Prescriptions  Medication Sig Dispense Refill  . desoximetasone (TOPICORT) 0.05  % cream Apply 1 application topically 2 (two) times daily. As needed.      Marland Kitchen lisinopril (PRINIVIL,ZESTRIL) 5 MG tablet TAKE ONE TABLET (5MG ) BY MOUTH DAILY  30 tablet  12  . metFORMIN (GLUCOPHAGE) 1000 MG tablet TAKE ONE TABLET TWICE DAILY FOR DIABETES  60 tablet  11  . metoprolol (TOPROL-XL) 100 MG 24 hr tablet Take 2 tablets by mouth at bedtime      . Multiple Vitamin (MULTIVITAMIN) tablet Take 1 tablet by mouth daily.        . nitroGLYCERIN (NITROSTAT) 0.4 MG SL tablet Place 1 tablet (0.4 mg total) under the tongue every 5 (five) minutes as needed.  25 tablet  11  . OVER THE COUNTER MEDICATION Cold medications      . sildenafil (VIAGRA) 100 MG tablet Take 1 tablet (100 mg total) by mouth daily as needed.  10 tablet  0  . Tamsulosin HCl (FLOMAX) 0.4 MG CAPS Take 0.4 mg by mouth as needed.       . Testosterone 20.25 MG/ACT (1.62%) GEL Apply 1 drop topically daily.       . vitamin B-12 (CYANOCOBALAMIN) 500 MCG tablet Take 500 mcg by mouth daily.        Marland Kitchen warfarin (COUMADIN) 2.5 MG tablet TAKE AS DIRECTED BY ANTICOAGULATION     CLINIC  100 tablet  1  . zolpidem (AMBIEN) 10 MG tablet TAKE 1 TABLET AT BEDTIME AS NEEDED FOR  SLEEP  30 tablet  5  Allergies  Allergen Reactions  . Metoprolol Tartrate     Severe depression  . Salmon     Review of Systems negative except from HPI and PMH  Physical Exam BP 108/60  Pulse 60  Ht 5\' 11"  (1.803 m)  Wt 180 lb 8 oz (81.874 kg)  BMI 25.17 kg/m2  Well developed and nourished in no acute distress HENT normal Neck supple with JVP-flat Carotids brisk and full without bruits Clear Irregularly irregular rate and rhythm with controlled ventricular response, no murmurs or gallops Abd-soft with active BS without hepatomegaly No Clubbing cyanosis edema Skin-warm and dry A & Oriented  Grossly normal sensory and motor function    Assessment and  Plan In a nausea today

## 2011-05-20 NOTE — Assessment & Plan Note (Addendum)
We spoke for about 45 minutes regarding atrial fibrillation and treatment options. We discussed the role of antiarrhythmic therapy using dofetilide and/or amiodarone. Discussed the role of catheter ablation as well as AV junction ablation. He had multiple questions which were answered. He came with his ex-wife today.  Electrocardiogram today as a QTC was rendered him eligible for dofetilide. He will let us know when he would like to proceed with that.  We discussed drug side effects including the potential for proarrhythmia with dofetilide as well as systemic side effects associated with amiodarone

## 2011-05-26 ENCOUNTER — Encounter: Payer: BC Managed Care – PPO | Admitting: *Deleted

## 2011-06-02 ENCOUNTER — Encounter: Payer: Self-pay | Admitting: Internal Medicine

## 2011-06-08 ENCOUNTER — Ambulatory Visit: Payer: BC Managed Care – PPO | Admitting: Internal Medicine

## 2011-06-16 ENCOUNTER — Encounter: Payer: Self-pay | Admitting: Endocrinology

## 2011-06-16 ENCOUNTER — Ambulatory Visit (INDEPENDENT_AMBULATORY_CARE_PROVIDER_SITE_OTHER): Payer: BC Managed Care – PPO | Admitting: Endocrinology

## 2011-06-16 ENCOUNTER — Ambulatory Visit (INDEPENDENT_AMBULATORY_CARE_PROVIDER_SITE_OTHER)
Admission: RE | Admit: 2011-06-16 | Discharge: 2011-06-16 | Disposition: A | Discharge status: Home / Self Care | Payer: BC Managed Care – PPO | Source: Ambulatory Visit | Attending: Endocrinology | Admitting: Endocrinology

## 2011-06-16 VITALS — BP 108/74 | HR 77 | Temp 97.8°F | Ht 71.0 in | Wt 182.0 lb

## 2011-06-16 DIAGNOSIS — R059 Cough, unspecified: Secondary | ICD-10-CM

## 2011-06-16 DIAGNOSIS — R05 Cough: Secondary | ICD-10-CM

## 2011-06-16 MED ORDER — CEFUROXIME AXETIL 250 MG PO TABS: 250.0000 mg | ORAL_TABLET | Freq: Two times a day (BID) | ORAL | Status: AC

## 2011-06-16 MED ORDER — CEFUROXIME AXETIL 250 MG PO TABS: 250.0000 mg | ORAL_TABLET | Freq: Two times a day (BID) | ORAL | Status: DC

## 2011-06-16 MED ORDER — PROMETHAZINE-CODEINE 6.25-10 MG/5ML PO SYRP: 5.0000 mL | ORAL_SOLUTION | ORAL | Status: DC | PRN

## 2011-06-16 MED ORDER — PROMETHAZINE-CODEINE 6.25-10 MG/5ML PO SYRP: 5.0000 mL | ORAL_SOLUTION | ORAL | Status: AC | PRN

## 2011-06-16 NOTE — Progress Notes (Signed)
Subjective:    Patient ID: [name], male    DOB: [birth year], 66 y.o.   MRN: [id number]  HPI Pt states a few days of moderate prod-quality cough in the chest, and assoc sore throat.   Past Medical History  Diagnosis Date  . Ischemic cardiomyopathy 09/22/2008    DES CX and RCA  70% residual LAD  Done in [place]; EF 30%;; myoviewq 2008 no ischemia  . Implantable cardiac defibrillator MDT 09/22/2008    Change out feb 2011  . NEPHROLITHIASIS, HX OF 02/25/2008  . CALLUS, RIGHT FOOT 12/08/2009  . Chronic systolic heart failure 11/03/2009  . Atrial fibrillation 11/25/2006    Inapp shock 2012 AF VR >220 recurrent  . HYPERLIPIDEMIA 11/25/2006  . HYPOGONADISM, MALE 12/05/2006  . 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 DIABETES MELLITUS, TYPE II 12/05/2006  . 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 Encounter for long-term (current) use of anticoagulants 04/25/2010    Past Surgical History  Procedure Date  . Cataract extraction   . Tonsillectomy   . Cardiac defibrillator placement     Guidant Vitality [name]  . Ptca     History   Social History  . Marital Status: Divorced    Spouse Name: N/A    Number of Children: 1  . Years of Education: 16   Occupational History  . 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 Smoking status: Never Smoker   . Smokeless tobacco: Never Used  . Alcohol Use: Yes  . Drug Use: No  . Sexually Active: Yes -- Male partner(s)   Other Topics Concern  . Not on file   Social History Narrative   HS[employer], College grad. Married - divorced. 1 daughter. owner/operator Optician, dispensing business and show production    Current Outpatient Prescriptions on File Prior to Visit  Medication Sig Dispense Refill  . desoximetasone (TOPICORT) 0.05 % cream Apply 1 application topically 2 (two) times daily. As needed.      Marland Kitchen lisinopril (PRINIVIL,ZESTRIL) 5 MG tablet TAKE ONE TABLET (5MG ) BY MOUTH DAILY  30 tablet  12  .  metFORMIN (GLUCOPHAGE) 1000 MG tablet TAKE ONE TABLET TWICE DAILY FOR DIABETES  60 tablet  11  . metoprolol (TOPROL-XL) 100 MG 24 hr tablet Take 2 tablets by mouth at bedtime      . Multiple Vitamin (MULTIVITAMIN) tablet Take 1 tablet by mouth daily.        . nitroGLYCERIN (NITROSTAT) 0.4 MG SL tablet Place 1 tablet (0.4 mg total) under the tongue every 5 (five) minutes as needed.  25 tablet  11  . OVER THE COUNTER MEDICATION Cold medications      . sildenafil (VIAGRA) 100 MG tablet Take 1 tablet (100 mg total) by mouth daily as needed.  10 tablet  0  . Tamsulosin HCl (FLOMAX) 0.4 MG CAPS Take 0.4 mg by mouth as needed.       . Testosterone 20.25 MG/ACT (1.62%) GEL Apply 1 drop topically daily.       . vitamin B-12 (CYANOCOBALAMIN) 500 MCG tablet Take 500 mcg by mouth daily.        Marland Kitchen warfarin (COUMADIN) 2.5 MG tablet TAKE AS DIRECTED BY ANTICOAGULATION     CLINIC  100 tablet  1  . zolpidem (AMBIEN) 10 MG tablet TAKE 1 TABLET AT BEDTIME AS NEEDED FOR  SLEEP  30 tablet  5    Allergies  Allergen Reactions  . Metoprolol Tartrate  Severe depression  . Salmon     Family History  Problem Relation Age of Onset  . Diabetes Maternal Grandmother   . Hyperlipidemia Maternal Grandmother   . Cancer Neg Hx   . COPD Neg Hx   . Heart disease Neg Hx     BP 108/74  Pulse 77  Temp(Src) 97.8 F (36.6 C) (Oral)  Ht 5\' 11"  (1.803 m)  Wt 182 lb (82.555 kg)  BMI 25.38 kg/m2  SpO2 97%    Review of Systems No sob and earache    Objective:   Physical Exam VITAL SIGNS:  See vs page GENERAL: no distress head: no deformity eyes: no periorbital swelling, no proptosis external nose and ears are normal mouth: no lesion seen Both tm's are red LUNGS:  Clear to auscultation    Cxr: nad    Assessment & Plan:  Acute bronchitis, new

## 2011-06-16 NOTE — Patient Instructions (Addendum)
Here are 2 prescriptions: for an antibiotic and cough syrup. I hope you feel better soon.  If you don't feel better by next week, please call back.  A chest-x-ray is requested for you today.  You will receive a letter with results. (see letter)

## 2011-06-17 ENCOUNTER — Telehealth: Payer: Self-pay | Admitting: *Deleted

## 2011-06-17 NOTE — Telephone Encounter (Signed)
Called pt to inform of xray results, pt informed (letter also mailed to pt).  

## 2011-06-21 ENCOUNTER — Ambulatory Visit (INDEPENDENT_AMBULATORY_CARE_PROVIDER_SITE_OTHER): Payer: BC Managed Care – PPO | Admitting: *Deleted

## 2011-06-21 DIAGNOSIS — Z7901 Long term (current) use of anticoagulants: Secondary | ICD-10-CM

## 2011-06-21 DIAGNOSIS — I4891 Unspecified atrial fibrillation: Secondary | ICD-10-CM

## 2011-06-21 LAB — POCT INR: INR: 2.8

## 2011-07-18 ENCOUNTER — Ambulatory Visit (INDEPENDENT_AMBULATORY_CARE_PROVIDER_SITE_OTHER): Payer: BC Managed Care – PPO | Admitting: *Deleted

## 2011-07-18 DIAGNOSIS — I4891 Unspecified atrial fibrillation: Secondary | ICD-10-CM

## 2011-07-18 DIAGNOSIS — Z7901 Long term (current) use of anticoagulants: Secondary | ICD-10-CM

## 2011-08-17 ENCOUNTER — Other Ambulatory Visit: Payer: Self-pay | Admitting: *Deleted

## 2011-08-17 ENCOUNTER — Other Ambulatory Visit: Payer: Self-pay

## 2011-08-17 MED ORDER — ZOLPIDEM TARTRATE 10 MG PO TABS: 10.0000 mg | ORAL_TABLET | Freq: Every evening | ORAL | Status: DC | PRN

## 2011-08-17 MED ORDER — SILDENAFIL CITRATE 100 MG PO TABS: 100.0000 mg | ORAL_TABLET | Freq: Every day | ORAL | Status: DC | PRN

## 2011-08-17 NOTE — Telephone Encounter (Signed)
Patient is not on Levitra. Rx for Viagra printed

## 2011-08-17 NOTE — Telephone Encounter (Signed)
Pt called requesting to pick up hardcopy Rx for Zolpidem # 30 and Levitra. Please advise.

## 2011-08-17 NOTE — Telephone Encounter (Signed)
ok 

## 2011-08-17 NOTE — Telephone Encounter (Signed)
Patient Rx s printed for patient to pick up after Dr. Debby Bud signs.

## 2011-08-17 NOTE — Telephone Encounter (Signed)
Rx  error ?

## 2011-08-17 NOTE — Telephone Encounter (Signed)
Left message on VM of need to call office for pharmacy that Rxs need to be sent to

## 2011-08-18 ENCOUNTER — Other Ambulatory Visit: Payer: Self-pay | Admitting: *Deleted

## 2011-08-18 NOTE — Telephone Encounter (Signed)
Patient notified of Rxs sent to his [facility]. ambien and viagra. Patient states he did not want Viagra . Had been given samples of Levitra and found that it works better for him and was requesting Levitra instead of Viagra. Please let me know .

## 2011-08-18 NOTE — Telephone Encounter (Signed)
Ok to cancel out viagra and Rx levitra 20 mg #6 w/ 11 refills. Update med list

## 2011-08-19 MED ORDER — VARDENAFIL HCL 20 MG PO TABS: 20.0000 mg | ORAL_TABLET | Freq: Every day | ORAL | Status: DC | PRN

## 2011-08-19 NOTE — Telephone Encounter (Signed)
Rx/med list update. Pt advised.

## 2011-08-25 ENCOUNTER — Encounter: Payer: Self-pay | Admitting: Internal Medicine

## 2011-08-25 ENCOUNTER — Ambulatory Visit (INDEPENDENT_AMBULATORY_CARE_PROVIDER_SITE_OTHER): Payer: BC Managed Care – PPO | Admitting: *Deleted

## 2011-08-25 DIAGNOSIS — I5022 Chronic systolic (congestive) heart failure: Secondary | ICD-10-CM

## 2011-08-25 DIAGNOSIS — Z95810 Presence of automatic (implantable) cardiac defibrillator: Secondary | ICD-10-CM

## 2011-08-25 DIAGNOSIS — I2589 Other forms of chronic ischemic heart disease: Secondary | ICD-10-CM

## 2011-08-26 ENCOUNTER — Encounter: Payer: Self-pay | Admitting: *Deleted

## 2011-08-26 NOTE — Telephone Encounter (Signed)
A user error has taken place: encounter opened in error, closed for administrative reasons.

## 2011-08-29 LAB — REMOTE ICD DEVICE
BATTERY VOLTAGE: 3.0141 V
CHARGE TIME: 9.759 s
PACEART VT: 0
TOT-0002: 0
TZAT-0001ATACH: 2
TZAT-0001FASTVT: 1
TZAT-0002ATACH: NEGATIVE
TZAT-0002ATACH: NEGATIVE
TZAT-0002ATACH: NEGATIVE
TZAT-0011SLOWVT: 10 ms
TZAT-0012ATACH: 150 ms
TZAT-0012FASTVT: 200 ms
TZAT-0012SLOWVT: 200 ms
TZAT-0013SLOWVT: 3
TZAT-0018ATACH: NEGATIVE
TZAT-0018ATACH: NEGATIVE
TZAT-0019ATACH: 6 V
TZAT-0019ATACH: 6 V
TZAT-0019ATACH: 6 V
TZAT-0019SLOWVT: 8 V
TZAT-0020ATACH: 1.5 ms
TZAT-0020ATACH: 1.5 ms
TZAT-0020SLOWVT: 1.5 ms
TZON-0003FASTVT: 250 ms
TZON-0003SLOWVT: 280 ms
TZON-0004SLOWVT: 40
TZON-0004VSLOWVT: 20
TZON-0005SLOWVT: 16
TZON-0010FASTVT: 30 ms
TZON-0010SLOWVT: 30 ms
TZST-0001FASTVT: 2
TZST-0001FASTVT: 3
TZST-0001FASTVT: 4
TZST-0001SLOWVT: 3
TZST-0001SLOWVT: 4
TZST-0002ATACH: NEGATIVE
TZST-0002FASTVT: NEGATIVE
TZST-0002FASTVT: NEGATIVE
TZST-0003SLOWVT: 35 J
TZST-0003SLOWVT: 35 J
VENTRICULAR PACING ICD: 27.22 pct

## 2011-08-30 ENCOUNTER — Ambulatory Visit (INDEPENDENT_AMBULATORY_CARE_PROVIDER_SITE_OTHER): Payer: BC Managed Care – PPO | Admitting: Pharmacist

## 2011-08-30 DIAGNOSIS — I4891 Unspecified atrial fibrillation: Secondary | ICD-10-CM

## 2011-08-30 DIAGNOSIS — Z7901 Long term (current) use of anticoagulants: Secondary | ICD-10-CM

## 2011-08-30 LAB — POCT INR: INR: 2.2

## 2011-09-09 ENCOUNTER — Encounter: Payer: Self-pay | Admitting: *Deleted

## 2011-09-27 ENCOUNTER — Other Ambulatory Visit: Payer: Self-pay | Admitting: Internal Medicine

## 2011-09-27 NOTE — Telephone Encounter (Signed)
reqeust refill on ambien. Printed and to be signed and faxed

## 2011-10-11 ENCOUNTER — Ambulatory Visit (INDEPENDENT_AMBULATORY_CARE_PROVIDER_SITE_OTHER): Payer: BC Managed Care – PPO | Admitting: *Deleted

## 2011-10-11 DIAGNOSIS — I4891 Unspecified atrial fibrillation: Secondary | ICD-10-CM

## 2011-10-11 DIAGNOSIS — Z7901 Long term (current) use of anticoagulants: Secondary | ICD-10-CM

## 2011-10-11 LAB — POCT INR: INR: 2.7

## 2011-10-14 ENCOUNTER — Other Ambulatory Visit: Payer: Self-pay

## 2011-10-14 MED ORDER — DESOXIMETASONE 0.05 % EX CREA: 1.0000 "application " | TOPICAL_CREAM | Freq: Two times a day (BID) | CUTANEOUS | Status: DC

## 2011-10-24 ENCOUNTER — Other Ambulatory Visit: Payer: Self-pay | Admitting: Internal Medicine

## 2011-11-02 ENCOUNTER — Encounter: Payer: Self-pay | Admitting: General Practice

## 2011-11-22 ENCOUNTER — Ambulatory Visit: Payer: BC Managed Care – PPO | Admitting: Cardiovascular Disease

## 2011-11-22 ENCOUNTER — Ambulatory Visit (INDEPENDENT_AMBULATORY_CARE_PROVIDER_SITE_OTHER): Payer: BC Managed Care – PPO | Admitting: *Deleted

## 2011-11-22 ENCOUNTER — Encounter: Payer: Self-pay | Admitting: Cardiovascular Disease

## 2011-11-22 ENCOUNTER — Ambulatory Visit (INDEPENDENT_AMBULATORY_CARE_PROVIDER_SITE_OTHER): Payer: BC Managed Care – PPO | Admitting: Cardiovascular Disease

## 2011-11-22 VITALS — BP 108/78 | HR 76 | Ht 71.0 in | Wt 181.8 lb

## 2011-11-22 DIAGNOSIS — I4891 Unspecified atrial fibrillation: Secondary | ICD-10-CM

## 2011-11-22 DIAGNOSIS — Z7901 Long term (current) use of anticoagulants: Secondary | ICD-10-CM

## 2011-11-22 LAB — POCT INR: INR: 2

## 2011-11-22 NOTE — Progress Notes (Signed)
HPI:  66 year old gentleman presenting for followup evaluation. He has coronary artery disease status post PCI. He also has persistent atrial fibrillation. He has undergone ICD implantation and has a history of an inappropriate shock for atrial fibrillation.  The patient continues to complain of generalized fatigue. This is his major problem. He denies chest pain or pressure. He has mild dyspnea with exertion which is unchanged over time. He denies edema, orthopnea, or PND  He has had past discussions with Dr. Graciela Husbands about chemical cardioversion but he has not pursued this. However, when he has been inappropriately shocked for atrial fibrillation, he notices that he feels much better in sinus rhythm.  Outpatient Encounter Prescriptions as of 11/22/2011  Medication Sig Dispense Refill  . lisinopril (PRINIVIL,ZESTRIL) 5 MG tablet TAKE ONE TABLET (5MG ) BY MOUTH DAILY  30 tablet  12  . metFORMIN (GLUCOPHAGE) 1000 MG tablet TAKE ONE TABLET TWICE DAILY FOR DIABETES  60 tablet  11  . metoprolol (TOPROL-XL) 100 MG 24 hr tablet Take 2 tablets by mouth at bedtime      . Multiple Vitamin (MULTIVITAMIN) tablet Take 1 tablet by mouth daily.        . nitroGLYCERIN (NITROSTAT) 0.4 MG SL tablet Place 1 tablet (0.4 mg total) under the tongue every 5 (five) minutes as needed.  25 tablet  11  . OVER THE COUNTER MEDICATION Cold medications      . Tamsulosin HCl (FLOMAX) 0.4 MG CAPS Take 0.4 mg by mouth as needed.       . Testosterone 20.25 MG/ACT (1.62%) GEL Apply 1 drop topically daily.       . vitamin B-12 (CYANOCOBALAMIN) 500 MCG tablet Take 500 mcg by mouth daily.        Marland Kitchen warfarin (COUMADIN) 2.5 MG tablet USE AS DIRECTED BY ANTICOAGULATION      CLINIC  120 tablet  0  . zolpidem (AMBIEN) 10 MG tablet TAKE ONE TABLET AT BEDTIME AS           NEEDED FOR SLEEP  30 tablet  5  . desoximetasone (TOPICORT) 0.05 % cream Apply 1 application topically 2 (two) times daily. As needed.  30 g  1  . vardenafil (LEVITRA) 20 MG  tablet Take 1 tablet (20 mg total) by mouth daily as needed for erectile dysfunction.  6 tablet  11    Allergies  Allergen Reactions  . Fish Oil   . Metoprolol Tartrate     Severe depression    Past Medical History  Diagnosis Date  . Ischemic cardiomyopathy 09/22/2008    DES CX and RCA  70% residual LAD  Done in [place]; EF 30%;; myoviewq 2008 no ischemia  . Implantable cardiac defibrillator MDT 09/22/2008    Change out feb 2011  . NEPHROLITHIASIS, HX OF 02/25/2008  . CALLUS, RIGHT FOOT 12/08/2009  . Chronic systolic heart failure 11/03/2009  . Atrial fibrillation 11/25/2006    Inapp shock 2012 AF VR >220 recurrent  . HYPERLIPIDEMIA 11/25/2006  . HYPOGONADISM, MALE 12/05/2006  . 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 DIABETES MELLITUS, TYPE II 12/05/2006  . 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 Long term (current) use of anticoagulants 04/25/2010    ROS: Negative except as per HPI  BP 108/78  Pulse 76  Ht 5\' 11"  (1.803 m)  Wt 82.464 kg (181 lb 12.8 oz)  BMI 25.36 kg/m2  PHYSICAL EXAM: Pt is alert and oriented, NAD HEENT: normal Neck: JVP - normal, carotids 2+= without bruits Lungs:  CTA bilaterally CV: RRR without murmur or gallop Abd: soft, NT, Positive BS, no hepatomegaly Ext: no C/C/E, distal pulses intact and equal Skin: warm/dry no rash  EKG:  Atrial fibrillation with demand ventricular pacing 64 beats per minute.  ASSESSMENT AND PLAN: 1. Coronary artery disease, native vessel. The patient is stable without anginal symptoms and he will remain on his current medical regimen. He's on an ACE inhibitor and beta blocker. He is not on antiplatelet therapy because of long-term warfarin and he has not on a statin drug because of intolerance.  2. Chronic atrial fibrillation. We again had a long discussion about consideration of antiarrhythmic therapy. We reviewed potential drug options, the best of which is probably Tikosyn. He understands this requires a 3 day  hospital admission and he is reluctant to move forward with this. He will let us know if he changes his mind, otherwise he will followup with Dr. Graciela Husbands in 6 months as scheduled.  3. Chronic systolic heart failure, [place] Heart Association class II. He is on appropriate medical regimen as above.  Tonny Bollman 12/20/2011 5:42 PM

## 2011-11-22 NOTE — Patient Instructions (Addendum)
If you decide to do a Tikosyn admission please call the office a [phone] and speak with Dr Odessa Fleming nurse.   Your physician wants you to follow-up in: 6 MONTHS with Dr Graciela Husbands.  You will receive a reminder letter in the mail two months in advance. If you don't receive a letter, please call our office to schedule the follow-up appointment.

## 2011-11-28 ENCOUNTER — Encounter: Payer: Self-pay | Admitting: Internal Medicine

## 2011-11-28 ENCOUNTER — Ambulatory Visit (INDEPENDENT_AMBULATORY_CARE_PROVIDER_SITE_OTHER): Payer: BC Managed Care – PPO | Admitting: *Deleted

## 2011-11-28 DIAGNOSIS — I428 Other cardiomyopathies: Secondary | ICD-10-CM

## 2011-11-28 DIAGNOSIS — I4891 Unspecified atrial fibrillation: Secondary | ICD-10-CM

## 2011-11-28 DIAGNOSIS — I5022 Chronic systolic (congestive) heart failure: Secondary | ICD-10-CM

## 2011-11-28 LAB — REMOTE ICD DEVICE
AL AMPLITUDE: 2 mv
BATTERY VOLTAGE: 2.9957 V
FVT: 0
PACEART VT: 0
RV LEAD AMPLITUDE: 20 mv
RV LEAD IMPEDENCE ICD: 418 Ohm
TOT-0001: 2
TZAT-0001ATACH: 1
TZAT-0001ATACH: 2
TZAT-0001ATACH: 3
TZAT-0002ATACH: NEGATIVE
TZAT-0004SLOWVT: 8
TZAT-0005SLOWVT: 88 pct
TZAT-0011SLOWVT: 10 ms
TZAT-0012ATACH: 150 ms
TZAT-0012FASTVT: 200 ms
TZAT-0012SLOWVT: 200 ms
TZAT-0018ATACH: NEGATIVE
TZAT-0018ATACH: NEGATIVE
TZAT-0018ATACH: NEGATIVE
TZAT-0019ATACH: 6 V
TZAT-0019FASTVT: 8 V
TZAT-0020FASTVT: 1.5 ms
TZON-0003SLOWVT: 280 ms
TZON-0004VSLOWVT: 20
TZON-0010FASTVT: 30 ms
TZST-0001ATACH: 5
TZST-0001ATACH: 6
TZST-0001FASTVT: 2
TZST-0001FASTVT: 3
TZST-0001FASTVT: 5
TZST-0001SLOWVT: 3
TZST-0001SLOWVT: 5
TZST-0001SLOWVT: 6
TZST-0002ATACH: NEGATIVE
TZST-0002ATACH: NEGATIVE
TZST-0002FASTVT: NEGATIVE
TZST-0002FASTVT: NEGATIVE
TZST-0003SLOWVT: 20 J
TZST-0003SLOWVT: 35 J

## 2011-11-29 NOTE — Progress Notes (Signed)
Remote defib check w/icm  

## 2011-12-13 ENCOUNTER — Encounter: Payer: Self-pay | Admitting: *Deleted

## 2011-12-23 ENCOUNTER — Other Ambulatory Visit: Payer: Self-pay | Admitting: Internal Medicine

## 2012-01-03 ENCOUNTER — Ambulatory Visit (INDEPENDENT_AMBULATORY_CARE_PROVIDER_SITE_OTHER): Payer: BC Managed Care – PPO

## 2012-01-03 DIAGNOSIS — I4891 Unspecified atrial fibrillation: Secondary | ICD-10-CM

## 2012-01-03 DIAGNOSIS — Z7901 Long term (current) use of anticoagulants: Secondary | ICD-10-CM

## 2012-01-03 LAB — POCT INR: INR: 2.8

## 2012-01-23 ENCOUNTER — Other Ambulatory Visit: Payer: Self-pay | Admitting: Internal Medicine

## 2012-01-31 ENCOUNTER — Other Ambulatory Visit: Payer: Self-pay | Admitting: Internal Medicine

## 2012-02-14 ENCOUNTER — Ambulatory Visit (INDEPENDENT_AMBULATORY_CARE_PROVIDER_SITE_OTHER): Payer: BC Managed Care – PPO | Admitting: *Deleted

## 2012-02-14 DIAGNOSIS — Z7901 Long term (current) use of anticoagulants: Secondary | ICD-10-CM

## 2012-02-14 DIAGNOSIS — I4891 Unspecified atrial fibrillation: Secondary | ICD-10-CM

## 2012-02-14 LAB — POCT INR: INR: 3

## 2012-02-15 ENCOUNTER — Other Ambulatory Visit: Payer: Self-pay | Admitting: *Deleted

## 2012-02-15 NOTE — Telephone Encounter (Signed)
Pt requesting refill of Androgel-last written 01/21/2011 #75g with 5 refills-please advise.

## 2012-02-15 NOTE — Telephone Encounter (Signed)
ok for refill as requested.

## 2012-02-16 MED ORDER — TESTOSTERONE 20.25 MG/ACT (1.62%) TD GEL: 1.0000 [drp] | Freq: Every day | TRANSDERMAL | Status: DC

## 2012-02-16 NOTE — Telephone Encounter (Signed)
Rx printed, awaiting MD's signature.  

## 2012-02-17 NOTE — Telephone Encounter (Signed)
Rx sent yesterday by Carollee Herter, CMA. Confirmed with [facility].

## 2012-02-28 ENCOUNTER — Encounter: Payer: Self-pay | Admitting: Internal Medicine

## 2012-02-28 ENCOUNTER — Emergency Department (HOSPITAL_COMMUNITY): Payer: BC Managed Care – PPO

## 2012-02-28 ENCOUNTER — Encounter (HOSPITAL_COMMUNITY): Payer: Self-pay | Admitting: *Deleted

## 2012-02-28 ENCOUNTER — Emergency Department (HOSPITAL_COMMUNITY)
Admission: EM | Admit: 2012-02-28 | Discharge: 2012-02-28 | Disposition: A | Discharge status: Home / Self Care | Payer: BC Managed Care – PPO | Attending: Emergency Medicine | Admitting: Emergency Medicine

## 2012-02-28 DIAGNOSIS — Z8639 Personal history of other endocrine, nutritional and metabolic disease: Secondary | ICD-10-CM | POA: Insufficient documentation

## 2012-02-28 DIAGNOSIS — Z87442 Personal history of urinary calculi: Secondary | ICD-10-CM | POA: Insufficient documentation

## 2012-02-28 DIAGNOSIS — I498 Other specified cardiac arrhythmias: Secondary | ICD-10-CM | POA: Insufficient documentation

## 2012-02-28 DIAGNOSIS — S060X9A Concussion with loss of consciousness of unspecified duration, initial encounter: Secondary | ICD-10-CM | POA: Insufficient documentation

## 2012-02-28 DIAGNOSIS — R55 Syncope and collapse: Secondary | ICD-10-CM | POA: Insufficient documentation

## 2012-02-28 DIAGNOSIS — I5022 Chronic systolic (congestive) heart failure: Secondary | ICD-10-CM | POA: Insufficient documentation

## 2012-02-28 DIAGNOSIS — Y929 Unspecified place or not applicable: Secondary | ICD-10-CM | POA: Insufficient documentation

## 2012-02-28 DIAGNOSIS — Z872 Personal history of diseases of the skin and subcutaneous tissue: Secondary | ICD-10-CM | POA: Insufficient documentation

## 2012-02-28 DIAGNOSIS — G909 Disorder of the autonomic nervous system, unspecified: Secondary | ICD-10-CM | POA: Insufficient documentation

## 2012-02-28 DIAGNOSIS — Z95810 Presence of automatic (implantable) cardiac defibrillator: Secondary | ICD-10-CM | POA: Insufficient documentation

## 2012-02-28 DIAGNOSIS — Z7901 Long term (current) use of anticoagulants: Secondary | ICD-10-CM | POA: Insufficient documentation

## 2012-02-28 DIAGNOSIS — Z85828 Personal history of other malignant neoplasm of skin: Secondary | ICD-10-CM | POA: Insufficient documentation

## 2012-02-28 DIAGNOSIS — Z4502 Encounter for adjustment and management of automatic implantable cardiac defibrillator: Secondary | ICD-10-CM | POA: Insufficient documentation

## 2012-02-28 DIAGNOSIS — S069X9A Unspecified intracranial injury with loss of consciousness of unspecified duration, initial encounter: Secondary | ICD-10-CM

## 2012-02-28 DIAGNOSIS — R42 Dizziness and giddiness: Secondary | ICD-10-CM | POA: Insufficient documentation

## 2012-02-28 DIAGNOSIS — I471 Supraventricular tachycardia: Secondary | ICD-10-CM

## 2012-02-28 DIAGNOSIS — Z8679 Personal history of other diseases of the circulatory system: Secondary | ICD-10-CM | POA: Insufficient documentation

## 2012-02-28 DIAGNOSIS — Z862 Personal history of diseases of the blood and blood-forming organs and certain disorders involving the immune mechanism: Secondary | ICD-10-CM | POA: Insufficient documentation

## 2012-02-28 DIAGNOSIS — IMO0002 Reserved for concepts with insufficient information to code with codable children: Secondary | ICD-10-CM | POA: Insufficient documentation

## 2012-02-28 DIAGNOSIS — Y939 Activity, unspecified: Secondary | ICD-10-CM | POA: Insufficient documentation

## 2012-02-28 DIAGNOSIS — E1149 Type 2 diabetes mellitus with other diabetic neurological complication: Secondary | ICD-10-CM | POA: Insufficient documentation

## 2012-02-28 DIAGNOSIS — R5381 Other malaise: Secondary | ICD-10-CM | POA: Insufficient documentation

## 2012-02-28 DIAGNOSIS — Z79899 Other long term (current) drug therapy: Secondary | ICD-10-CM | POA: Insufficient documentation

## 2012-02-28 DIAGNOSIS — E291 Testicular hypofunction: Secondary | ICD-10-CM | POA: Insufficient documentation

## 2012-02-28 LAB — POCT I-STAT TROPONIN I
Troponin i, poc: 0.01 ng/mL (ref 0.00–0.08)
Troponin i, poc: 0.02 ng/mL (ref 0.00–0.08)

## 2012-02-28 LAB — PROTIME-INR
INR: 2.25 — ABNORMAL HIGH (ref 0.00–1.49)
Prothrombin Time: 23.9 seconds — ABNORMAL HIGH (ref 11.6–15.2)

## 2012-02-28 LAB — POCT I-STAT, CHEM 8
Chloride: 102 mEq/L (ref 96–112)
Glucose, Bld: 151 mg/dL — ABNORMAL HIGH (ref 70–99)
HCT: 41 % (ref 39.0–52.0)
Potassium: 3.8 mEq/L (ref 3.5–5.1)

## 2012-02-28 MED ORDER — SODIUM CHLORIDE 0.9 % IV BOLUS (SEPSIS)
1000.0000 mL | Freq: Once | INTRAVENOUS | Status: AC
  Administered 2012-02-28: 1000 mL via INTRAVENOUS

## 2012-02-28 NOTE — ED Notes (Signed)
[employer] results reported to [facility], MD, reported that pt had 12 sec episode @ 19:22 of atrial arrhythmia & increased ventricular rates, pt received 1 shock & converted to NSR

## 2012-02-28 NOTE — ED Provider Notes (Signed)
History     CSN: [id number]  Arrival date & time 02/28/12  Ernestina Columbia   First MD Initiated Contact with Patient 02/28/12 1927      Chief Complaint  Patient presents with  . Loss of Consciousness    (Consider location/radiation/quality/duration/timing/severity/associated sxs/prior treatment) HPI This 66 year old male has a history of H. or fibrillation with an ischemic cardiomyopathy with an implantable cardiac defibrillator on Coumadin therapy who woke up feeling fine today but this evening had an episode of feeling gradual onset lightheadedness warmth generalized weakness presyncope he felt like he was going to faint now down and his daughter helped him as he had a brief fainting spell for a few seconds and barely bumped his head on the side of the bed as he was fainting, he had a few myoclonic jerks of seizure activity, he woke up and was given 2 nitroglycerin glycerin tablets, he felt more lightheaded so he was given 2 more nitroglycerin glycerin tablets sublingually, he felt worse and had another brief syncopal spell without trauma. He woke up feeling generally weak and is now feeling much improved without lightheadedness but he still feels mild generalized weakness. He is no headache no neck pain no chest pain no palpitations no shortness of breath he is no change in speech vision swallowing or understanding he is no weakness numbness or incoordination and he is able to walk unassisted now. He feels much better just has some mild generalized weakness now. He is no neck pain or back pain. He is no apparent injury but did bump his head slightly on the bed. Past Medical History  Diagnosis Date  . Ischemic cardiomyopathy 09/22/2008    DES CX and RCA  70% residual LAD  Done in [place]; EF 30%;; myoviewq 2008 no ischemia  . Implantable cardiac defibrillator MDT 09/22/2008    Change out feb 2011  . NEPHROLITHIASIS, HX OF 02/25/2008  . CALLUS, RIGHT FOOT 12/08/2009  . Chronic systolic heart failure  11/03/2009  . Atrial fibrillation 11/25/2006    Inapp shock 2012 AF VR >220 recurrent  . HYPERLIPIDEMIA 11/25/2006  . HYPOGONADISM, MALE 12/05/2006  . 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 DIABETES MELLITUS, TYPE II 12/05/2006  . 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 Long term (current) use of anticoagulants 04/25/2010    Past Surgical History  Procedure Date  . Cataract extraction   . Tonsillectomy   . Cardiac defibrillator placement     Guidant Vitality [name]  . Ptca     Family History  Problem Relation Age of Onset  . Diabetes Maternal Grandmother   . Hyperlipidemia Maternal Grandmother   . Cancer Neg Hx   . COPD Neg Hx   . Heart disease Neg Hx     History  Substance Use Topics  . Smoking status: Never Smoker   . Smokeless tobacco: Never Used  . Alcohol Use: Yes      Review of Systems 10 Systems reviewed and are negative for acute change except as noted in the HPI. Allergies  Salmon  Home Medications   Current Outpatient Rx  Name  Route  Sig  Dispense  Refill  . B COMPLEX PO TABS   Oral   Take 1 tablet by mouth daily.         Marland Kitchen LISINOPRIL 5 MG PO TABS      TAKE ONE TABLET EVERY DAY   90 tablet   1   . LORATADINE 10 MG PO TABS   Oral  Take 10 mg by mouth daily.         Marland Kitchen METFORMIN HCL 1000 MG PO TABS      TAKE ONE TABLET TWICE DAILY FOR DIABETES   100 tablet   2   . METOPROLOL TARTRATE 100 MG PO TABS   Oral   Take 100 mg by mouth 2 (two) times daily.         . CENTRUM SILVER ADULT 50+ PO   Oral   Take 1 tablet by mouth daily.         Marland Kitchen NITROGLYCERIN 0.4 MG SL SUBL   Sublingual   Place 1 tablet (0.4 mg total) under the tongue every 5 (five) minutes as needed.   25 tablet   11   . NITROGLYCERIN 0.4 MG SL SUBL   Sublingual   Place 0.4 mg under the tongue every 5 (five) minutes as needed. For chest pain         . PRESCRIPTION MEDICATION   Topical   Apply 1 application topically daily.         Marland Kitchen VITAMIN C  500 MG PO TABS   Oral   Take 500 mg by mouth daily.         . WARFARIN SODIUM 2.5 MG PO TABS      USE AS DIRECTED BY ANTICOAGULATION      CLINIC   120 tablet   1   . ZOLPIDEM TARTRATE 10 MG PO TABS      TAKE ONE TABLET AT BEDTIME AS           NEEDED FOR SLEEP   30 tablet   5   . VARDENAFIL HCL 20 MG PO TABS   Oral   Take 1 tablet (20 mg total) by mouth daily as needed for erectile dysfunction.   6 tablet   11     Please cancel Viagra Rx, thanks!     BP 121/85  Pulse 73  Temp 98.3 F (36.8 C)  Resp 17  SpO2 98%  Physical Exam  Nursing note and vitals reviewed. Constitutional:       Awake, alert, nontoxic appearance with baseline speech for patient.  HENT:  Head: Atraumatic.  Mouth/Throat: No oropharyngeal exudate.  Eyes: EOM are normal. Pupils are equal, round, and reactive to light. Right eye exhibits no discharge. Left eye exhibits no discharge.  Neck: Neck supple.  Cardiovascular: Normal rate and regular rhythm.   No murmur heard. Pulmonary/Chest: Effort normal and breath sounds normal. No stridor. No respiratory distress. He has no wheezes. He has no rales. He exhibits no tenderness.  Abdominal: Soft. Bowel sounds are normal. He exhibits no mass. There is no tenderness. There is no rebound.  Musculoskeletal: He exhibits no tenderness.       Baseline ROM, moves extremities with no obvious new focal weakness.  Lymphadenopathy:    He has no cervical adenopathy.  Neurological: He is alert.       Awake, alert, cooperative and aware of situation; motor strength bilaterally; sensation normal to light touch bilaterally; peripheral visual fields full to confrontation; no facial asymmetry; tongue midline; major cranial nerves appear intact; no pronator drift, normal finger to nose bilaterally, baseline gait without new ataxia.  Skin: No rash noted.  Psychiatric: He has a normal mood and affect.    ED Course  Procedures (including critical care time) ECG: Sinus  rhythm, ventricular rate 87, normal axis, left bundle branch block, compared to September 2013 the patient no longer has  a paced rhythm   The patient understandably has a concern about the cost of a CT scan however my concern was with even minor head trauma in patients on Coumadin and syncope he is at risk for intracranial hemorrhage, he then agrees to CT brain noncontrast.   D/w Cards agree with OutPt f/u. Labs Reviewed  PROTIME-INR - Abnormal; Notable for the following:    Prothrombin Time 23.9 (*)     INR 2.25 (*)     All other components within normal limits  POCT I-STAT, CHEM 8 - Abnormal; Notable for the following:    Glucose, Bld 151 (*)     All other components within normal limits  POCT I-STAT TROPONIN I  POCT I-STAT TROPONIN I  LAB REPORT - SCANNED   No results found.   1. Syncope   2. Atrial tachycardia   3. Minor head injury with loss of consciousness   4. Anticoagulation adequate with anticoagulant therapy       MDM  Patient / Family / Caregiver informed of clinical course, understand medical decision-making process, and agree with plan.       Hurman Horn, MD 03/01/12 2202

## 2012-02-28 NOTE — ED Notes (Signed)
Pt in CT. Family and pt's belongings in room

## 2012-02-28 NOTE — ED Notes (Signed)
Pt in from home via [facility], pt had syncopal episode which he requested x 2 SL nitro from daughter who was at scene, per family the pt felt weak & lowered himself to his knees & then to his butt & bottom, pts daughter states there was a 90 second syncopal episode, pt denies falling & hitting head, pt A&O x4, follows command, speaks in complete sentences, pt denies CP at this time, pt has ICD & defibrillator

## 2012-03-02 ENCOUNTER — Encounter: Payer: BC Managed Care – PPO | Admitting: Cardiology

## 2012-03-05 ENCOUNTER — Ambulatory Visit (INDEPENDENT_AMBULATORY_CARE_PROVIDER_SITE_OTHER): Payer: BC Managed Care – PPO | Admitting: *Deleted

## 2012-03-05 DIAGNOSIS — I5022 Chronic systolic (congestive) heart failure: Secondary | ICD-10-CM

## 2012-03-05 DIAGNOSIS — I2589 Other forms of chronic ischemic heart disease: Secondary | ICD-10-CM

## 2012-03-05 DIAGNOSIS — Z95810 Presence of automatic (implantable) cardiac defibrillator: Secondary | ICD-10-CM

## 2012-03-05 LAB — REMOTE ICD DEVICE
AL AMPLITUDE: 3.4 mv
AL IMPEDENCE ICD: 494 Ohm
BATTERY VOLTAGE: 2.948 V
RV LEAD IMPEDENCE ICD: 418 Ohm
TOT-0006: 20110228000000
TZAT-0001ATACH: 3
TZAT-0001SLOWVT: 1
TZAT-0002ATACH: NEGATIVE
TZAT-0002FASTVT: NEGATIVE
TZAT-0004SLOWVT: 8
TZAT-0005SLOWVT: 88 pct
TZAT-0012ATACH: 150 ms
TZAT-0012ATACH: 150 ms
TZAT-0012FASTVT: 200 ms
TZAT-0013SLOWVT: 3
TZAT-0018FASTVT: NEGATIVE
TZAT-0019ATACH: 6 V
TZAT-0019ATACH: 6 V
TZAT-0019FASTVT: 8 V
TZAT-0020ATACH: 1.5 ms
TZAT-0020ATACH: 1.5 ms
TZAT-0020ATACH: 1.5 ms
TZAT-0020SLOWVT: 1.5 ms
TZON-0003FASTVT: 250 ms
TZON-0003VSLOWVT: 450 ms
TZON-0004SLOWVT: 40
TZST-0001ATACH: 4
TZST-0001ATACH: 5
TZST-0001ATACH: 6
TZST-0001FASTVT: 2
TZST-0001FASTVT: 6
TZST-0001SLOWVT: 2
TZST-0001SLOWVT: 4
TZST-0001SLOWVT: 5
TZST-0002ATACH: NEGATIVE
TZST-0002FASTVT: NEGATIVE
TZST-0002FASTVT: NEGATIVE
TZST-0002FASTVT: NEGATIVE
TZST-0002FASTVT: NEGATIVE
TZST-0003SLOWVT: 20 J
TZST-0003SLOWVT: 35 J
TZST-0003SLOWVT: 35 J
TZST-0003SLOWVT: 35 J
VF: 1

## 2012-03-07 ENCOUNTER — Encounter: Payer: Self-pay | Admitting: Internal Medicine

## 2012-03-09 ENCOUNTER — Encounter: Payer: Self-pay | Admitting: Internal Medicine

## 2012-03-09 ENCOUNTER — Ambulatory Visit (INDEPENDENT_AMBULATORY_CARE_PROVIDER_SITE_OTHER): Payer: BC Managed Care – PPO | Admitting: Internal Medicine

## 2012-03-09 ENCOUNTER — Ambulatory Visit (INDEPENDENT_AMBULATORY_CARE_PROVIDER_SITE_OTHER): Payer: BC Managed Care – PPO | Admitting: *Deleted

## 2012-03-09 VITALS — BP 110/80 | HR 79 | Ht 71.0 in | Wt 185.0 lb

## 2012-03-09 DIAGNOSIS — I4891 Unspecified atrial fibrillation: Secondary | ICD-10-CM

## 2012-03-09 DIAGNOSIS — I5022 Chronic systolic (congestive) heart failure: Secondary | ICD-10-CM

## 2012-03-09 DIAGNOSIS — I2589 Other forms of chronic ischemic heart disease: Secondary | ICD-10-CM

## 2012-03-09 DIAGNOSIS — F329 Major depressive disorder, single episode, unspecified: Secondary | ICD-10-CM

## 2012-03-09 DIAGNOSIS — Z95810 Presence of automatic (implantable) cardiac defibrillator: Secondary | ICD-10-CM

## 2012-03-09 DIAGNOSIS — F3289 Other specified depressive episodes: Secondary | ICD-10-CM

## 2012-03-09 DIAGNOSIS — Z7901 Long term (current) use of anticoagulants: Secondary | ICD-10-CM

## 2012-03-09 DIAGNOSIS — F32A Depression, unspecified: Secondary | ICD-10-CM

## 2012-03-09 DIAGNOSIS — I4901 Ventricular fibrillation: Secondary | ICD-10-CM

## 2012-03-09 NOTE — Assessment & Plan Note (Signed)
As above  We spent more than an hour discussing the rhythm and emotional issues assoc with his device firing

## 2012-03-09 NOTE — Assessment & Plan Note (Signed)
In part related to afib and restoration of sinus is important.  He is also candidate for aldosterone antagonist which i will defer to dr [facility]

## 2012-03-09 NOTE — Assessment & Plan Note (Signed)
Pt had recurrent VF with subsequent anxiety overlying his depression   No clear cause evident, have discussed with Dr [facility] and will proceed with cath to reassess coronary anatomy He is advised not to drive for 6 months There is significant related and expect anxiety and will begin him on citrolapram;  Have offered him counseling as well although we will have to find someone

## 2012-03-09 NOTE — Assessment & Plan Note (Signed)
Recurrent afib  He and his daughter sare discussing the cost of hospitalization for tikosyn. I have encouraged them to talk with financial counselors at [facility] hospital

## 2012-03-09 NOTE — Assessment & Plan Note (Signed)
The patient's device was interrogated.  The information was reviewed. No changes were made in the programming.    

## 2012-03-09 NOTE — Patient Instructions (Addendum)
Your physician wants you to follow-up in: 6 months with Dr. Klein. You will receive a reminder letter in the mail two months in advance. If you don't receive a letter, please call our office to schedule the follow-up appointment.  

## 2012-03-09 NOTE — Progress Notes (Signed)
Patient Care Team: Michael E Norins, MD as PCP - General   HPI  [name] is a 67 y.o. male Seen in followup for aborted cardiac arrest for which he is status post ICD implantation. He is status post PCI with modest depression of LV function. His most recent ECHO cardiogram in October 2010 demonstrated 1. Left ventricle: The cavity size was normal. Wall thickness was  normal. Systolic function was moderately to severely reduced. The estimated ejection fraction was in the range of 30% to 35%.  A Myoview scan in 2008 demonstrated inferior posterior wall MI but no ischemia.  He has long-standing persistent atrial fibrillation.   He remembers that after he got shocked appropriately for rapid atrial fibrillation he felt much better. We had discussed on numerous occasions options for the restoration of sinus rhythm with dofetilide or amiodarone. He has declined. He comes in today to discuss these issues again.   On December 31 he developed ventricular fibrillation and was appropriately shocked. T he had subsequent sinus rhythm then reverted back to atrial fibrillation.  He admits to depression but also significant anxiety followign the event of 12/31.  Labs from hosp showed normal electrolytes and neg troponin X2  He has modest fatgue and he and his daughter have looked into the cost of hospitlaiztion for 3 days for the initiation of tikosyn>>18K and he would be responsible for about 40%   Past Medical History  Diagnosis Date  . Ischemic cardiomyopathy 09/22/2008    DES CX and RCA  70% residual LAD  Done in [place]; EF 30%;; myoviewq 2008 no ischemia  . Implantable cardiac defibrillator MDT 09/22/2008    Change out feb 2011  . NEPHROLITHIASIS, HX OF 02/25/2008  . CALLUS, RIGHT FOOT 12/08/2009  . Chronic systolic heart failure 11/03/2009  . Atrial fibrillation 11/25/2006    Inapp shock 2012 AF VR >220 recurrent  . HYPERLIPIDEMIA 11/25/2006  . HYPOGONADISM, MALE 12/05/2006  . DIABETIC PERIPHERAL  NEUROPATHY 12/08/2009  . DIABETES MELLITUS, TYPE II 12/05/2006  . 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 Long term (current) use of anticoagulants 04/25/2010    Past Surgical History  Procedure Date  . Cataract extraction   . Tonsillectomy   . Cardiac defibrillator placement     Guidant Vitality [name]  . Ptca     Current Outpatient Prescriptions  Medication Sig Dispense Refill  . ANDROGEL PUMP 20.25 MG/ACT (1.62%) GEL       . b complex vitamins tablet Take 1 tablet by mouth daily.      . lisinopril (PRINIVIL,ZESTRIL) 5 MG tablet TAKE ONE TABLET EVERY DAY  90 tablet  1  . loratadine (CLARITIN) 10 MG tablet Take 10 mg by mouth daily.      . metFORMIN (GLUCOPHAGE) 1000 MG tablet TAKE ONE TABLET TWICE DAILY FOR DIABETES  100 tablet  2  . metoprolol (LOPRESSOR) 100 MG tablet Take 100 mg by mouth 2 (two) times daily.      . Multiple Vitamins-Minerals (CENTRUM SILVER ADULT 50+ PO) Take 1 tablet by mouth daily.      . nitroGLYCERIN (NITROSTAT) 0.4 MG SL tablet Place 0.4 mg under the tongue every 5 (five) minutes as needed. For chest pain      . vardenafil (LEVITRA) 20 MG tablet Take 20 mg by mouth daily as needed.      . vitamin C (ASCORBIC ACID) 500 MG tablet Take 500 mg by mouth daily.      . warfarin (COUMADIN) 2.5   MG tablet USE AS DIRECTED BY ANTICOAGULATION      CLINIC  120 tablet  1  . zolpidem (AMBIEN) 10 MG tablet TAKE ONE TABLET AT BEDTIME AS           NEEDED FOR SLEEP  30 tablet  5    Allergies  Allergen Reactions  . Salmon (Fish Allergy) Other (See Comments)    Only canned/ not fresh (probably preservatives).     Review of Systems negative except from HPI and PMH  Physical Exam BP 110/80  Pulse 79  Ht 5' 11" (1.803 m)  Wt 185 lb (83.915 kg)  BMI 25.80 kg/m2 Well developed and well nourished in no acute distress HENT normal E scleral and icterus clear Neck Supple JVP flat; carotids brisk and full Clear to ausculation Regular rate and rhythm, no murmurs  gallops or rub Soft with active bowel sounds No clubbing cyanosis no Edema Alert and oriented, grossly normal motor and sensory function Skin Warm and Dry    Assessment and  Plan  

## 2012-03-12 ENCOUNTER — Telehealth: Payer: Self-pay | Admitting: Internal Medicine

## 2012-03-12 DIAGNOSIS — I4901 Ventricular fibrillation: Secondary | ICD-10-CM

## 2012-03-12 DIAGNOSIS — F419 Anxiety disorder, unspecified: Secondary | ICD-10-CM

## 2012-03-12 NOTE — Telephone Encounter (Signed)
New Problem:    Patient called in wanting to know what needs to be done to move forward in regards to a phoe call that they had.  Patient request that Dr. Graciela Husbands be consulted before calling him back.  Please call back once Dr. Graciela Husbands has been consulted

## 2012-03-13 NOTE — Telephone Encounter (Signed)
F/U   Calling back to speak with nurse regarding message on yesterday.

## 2012-03-14 ENCOUNTER — Encounter: Payer: Self-pay | Admitting: *Deleted

## 2012-03-14 MED ORDER — CITALOPRAM HYDROBROMIDE 20 MG PO TABS: ORAL_TABLET | ORAL | Status: DC

## 2012-03-14 NOTE — Telephone Encounter (Signed)
Pt calling back wanted to let it be know he has called three times, spoke with heather re this pt, and she will definitely call him back today

## 2012-03-14 NOTE — Telephone Encounter (Signed)
I spoke with the patient. He has been scheduled for a left heart cath with Dr. Excell Seltzer on 1/23 at 11:30am. He will come for lab work on 1/16. He will come for a repeat finger stick INR on 1/23 at 10:15 am. I have told him to be here at 10 am.

## 2012-03-15 ENCOUNTER — Telehealth: Payer: Self-pay | Admitting: Internal Medicine

## 2012-03-15 ENCOUNTER — Other Ambulatory Visit (INDEPENDENT_AMBULATORY_CARE_PROVIDER_SITE_OTHER): Payer: BC Managed Care – PPO

## 2012-03-15 DIAGNOSIS — I4901 Ventricular fibrillation: Secondary | ICD-10-CM

## 2012-03-15 LAB — CBC WITH DIFFERENTIAL/PLATELET
Basophils Absolute: 0 10*3/uL (ref 0.0–0.1)
Eosinophils Absolute: 0.2 10*3/uL (ref 0.0–0.7)
Lymphocytes Relative: 21.4 % (ref 12.0–46.0)
Lymphs Abs: 1.3 10*3/uL (ref 0.7–4.0)
MCHC: 33.8 g/dL (ref 30.0–36.0)
Monocytes Relative: 11.6 % (ref 3.0–12.0)
Platelets: 178 10*3/uL (ref 150.0–400.0)
RDW: 12.3 % (ref 11.5–14.6)

## 2012-03-15 LAB — BASIC METABOLIC PANEL
BUN: 20 mg/dL (ref 6–23)
CO2: 28 mEq/L (ref 19–32)
Calcium: 9.2 mg/dL (ref 8.4–10.5)
GFR: 61.72 mL/min (ref >60.00)
Glucose, Bld: 206 mg/dL — ABNORMAL HIGH (ref 70–99)

## 2012-03-15 LAB — PROTIME-INR: Prothrombin Time: 36.8 s — ABNORMAL HIGH (ref 10.2–12.4)

## 2012-03-15 NOTE — Telephone Encounter (Signed)
Advised pt that labs are not yet available.

## 2012-03-15 NOTE — Telephone Encounter (Signed)
New problem:   Has permission to leave message on home machine.     1. Had blood work today calling for results.

## 2012-03-16 ENCOUNTER — Telehealth: Payer: Self-pay | Admitting: Internal Medicine

## 2012-03-16 NOTE — Telephone Encounter (Signed)
I spoke with the patient and answered his questions. 

## 2012-03-16 NOTE — Telephone Encounter (Signed)
New Problem:    Patient called in with a question about his medication that you had mentioned the other day.  Please call back.

## 2012-03-19 ENCOUNTER — Other Ambulatory Visit: Payer: Self-pay | Admitting: Internal Medicine

## 2012-03-22 ENCOUNTER — Ambulatory Visit (INDEPENDENT_AMBULATORY_CARE_PROVIDER_SITE_OTHER): Payer: BC Managed Care – PPO

## 2012-03-22 ENCOUNTER — Other Ambulatory Visit: Payer: Self-pay | Admitting: *Deleted

## 2012-03-22 ENCOUNTER — Encounter (HOSPITAL_BASED_OUTPATIENT_CLINIC_OR_DEPARTMENT_OTHER)
Admission: RE | Disposition: A | Discharge status: Home / Self Care | Payer: Self-pay | Source: Ambulatory Visit | Attending: Cardiovascular Disease

## 2012-03-22 ENCOUNTER — Inpatient Hospital Stay (HOSPITAL_BASED_OUTPATIENT_CLINIC_OR_DEPARTMENT_OTHER)
Admission: RE | Admit: 2012-03-22 | Discharge: 2012-03-22 | Disposition: A | Discharge status: Home / Self Care | Payer: BC Managed Care – PPO | Source: Ambulatory Visit | Attending: Cardiovascular Disease | Admitting: Cardiovascular Disease

## 2012-03-22 DIAGNOSIS — I2510 Atherosclerotic heart disease of native coronary artery without angina pectoris: Secondary | ICD-10-CM

## 2012-03-22 DIAGNOSIS — I4891 Unspecified atrial fibrillation: Secondary | ICD-10-CM

## 2012-03-22 DIAGNOSIS — I2589 Other forms of chronic ischemic heart disease: Secondary | ICD-10-CM

## 2012-03-22 DIAGNOSIS — I4901 Ventricular fibrillation: Secondary | ICD-10-CM

## 2012-03-22 DIAGNOSIS — Z79899 Other long term (current) drug therapy: Secondary | ICD-10-CM | POA: Insufficient documentation

## 2012-03-22 DIAGNOSIS — F329 Major depressive disorder, single episode, unspecified: Secondary | ICD-10-CM

## 2012-03-22 DIAGNOSIS — E785 Hyperlipidemia, unspecified: Secondary | ICD-10-CM | POA: Insufficient documentation

## 2012-03-22 DIAGNOSIS — I509 Heart failure, unspecified: Secondary | ICD-10-CM | POA: Insufficient documentation

## 2012-03-22 DIAGNOSIS — Z9861 Coronary angioplasty status: Secondary | ICD-10-CM | POA: Insufficient documentation

## 2012-03-22 DIAGNOSIS — Z95810 Presence of automatic (implantable) cardiac defibrillator: Secondary | ICD-10-CM

## 2012-03-22 DIAGNOSIS — E1142 Type 2 diabetes mellitus with diabetic polyneuropathy: Secondary | ICD-10-CM | POA: Insufficient documentation

## 2012-03-22 DIAGNOSIS — E1149 Type 2 diabetes mellitus with other diabetic neurological complication: Secondary | ICD-10-CM | POA: Insufficient documentation

## 2012-03-22 DIAGNOSIS — I5022 Chronic systolic (congestive) heart failure: Secondary | ICD-10-CM

## 2012-03-22 DIAGNOSIS — Z7901 Long term (current) use of anticoagulants: Secondary | ICD-10-CM | POA: Insufficient documentation

## 2012-03-22 DIAGNOSIS — F32A Depression, unspecified: Secondary | ICD-10-CM

## 2012-03-22 LAB — POCT INR: INR: 1.1

## 2012-03-22 SURGERY — JV LEFT HEART CATHETERIZATION WITH CORONARY ANGIOGRAM
Anesthesia: Moderate Sedation

## 2012-03-22 MED ORDER — DIAZEPAM 5 MG PO TABS
5.0000 mg | ORAL_TABLET | ORAL | Status: AC
  Administered 2012-03-22: 5 mg via ORAL

## 2012-03-22 MED ORDER — ASPIRIN 81 MG PO CHEW
324.0000 mg | CHEWABLE_TABLET | ORAL | Status: AC
  Administered 2012-03-22: 324 mg via ORAL

## 2012-03-22 MED ORDER — ACETAMINOPHEN 325 MG PO TABS: 650.0000 mg | ORAL_TABLET | ORAL | Status: DC | PRN

## 2012-03-22 MED ORDER — ONDANSETRON HCL 4 MG/2ML IJ SOLN: 4.0000 mg | Freq: Four times a day (QID) | INTRAMUSCULAR | Status: DC | PRN

## 2012-03-22 MED ORDER — SODIUM CHLORIDE 0.9 % IV SOLN
INTRAVENOUS | Status: DC
  Administered 2012-03-22: 11:00:00 via INTRAVENOUS

## 2012-03-22 MED ORDER — SODIUM CHLORIDE 0.9 % IV SOLN: 1.0000 mL/kg/h | INTRAVENOUS | Status: DC

## 2012-03-22 NOTE — H&P (View-Only) (Signed)
Patient Care Team: Jacques Navy, MD as PCP - General   HPI  [name] is a 67 y.o. male Seen in followup for aborted cardiac arrest for which he is status post ICD implantation. He is status post PCI with modest depression of LV function. His most recent ECHO cardiogram in October 2010 demonstrated 1. Left ventricle: The cavity size was normal. Wall thickness was  normal. Systolic function was moderately to severely reduced. The estimated ejection fraction was in the range of 30% to 35%.  A Myoview scan in 2008 demonstrated inferior posterior wall MI but no ischemia.  He has long-standing persistent atrial fibrillation.   He remembers that after he got shocked appropriately for rapid atrial fibrillation he felt much better. We had discussed on numerous occasions options for the restoration of sinus rhythm with dofetilide or amiodarone. He has declined. He comes in today to discuss these issues again.   On December 31 he developed ventricular fibrillation and was appropriately shocked. T he had subsequent sinus rhythm then reverted back to atrial fibrillation.  He admits to depression but also significant anxiety followign the event of 12/31.  Labs from hosp showed normal electrolytes and neg troponin X2  He has modest fatgue and he and his daughter have looked into the cost of hospitlaiztion for 3 days for the initiation of tikosyn>>18K and he would be responsible for about 40%   Past Medical History  Diagnosis Date  . Ischemic cardiomyopathy 09/22/2008    DES CX and RCA  70% residual LAD  Done in [place]; EF 30%;; myoviewq 2008 no ischemia  . Implantable cardiac defibrillator MDT 09/22/2008    Change out feb 2011  . NEPHROLITHIASIS, HX OF 02/25/2008  . CALLUS, RIGHT FOOT 12/08/2009  . Chronic systolic heart failure 11/03/2009  . Atrial fibrillation 11/25/2006    Inapp shock 2012 AF VR >220 recurrent  . HYPERLIPIDEMIA 11/25/2006  . HYPOGONADISM, MALE 12/05/2006  . DIABETIC PERIPHERAL  NEUROPATHY 12/08/2009  . DIABETES MELLITUS, TYPE II 12/05/2006  . 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 Long term (current) use of anticoagulants 04/25/2010    Past Surgical History  Procedure Date  . Cataract extraction   . Tonsillectomy   . Cardiac defibrillator placement     Guidant Vitality [name]  . Ptca     Current Outpatient Prescriptions  Medication Sig Dispense Refill  . ANDROGEL PUMP 20.25 MG/ACT (1.62%) GEL       . b complex vitamins tablet Take 1 tablet by mouth daily.      Marland Kitchen lisinopril (PRINIVIL,ZESTRIL) 5 MG tablet TAKE ONE TABLET EVERY DAY  90 tablet  1  . loratadine (CLARITIN) 10 MG tablet Take 10 mg by mouth daily.      . metFORMIN (GLUCOPHAGE) 1000 MG tablet TAKE ONE TABLET TWICE DAILY FOR DIABETES  100 tablet  2  . metoprolol (LOPRESSOR) 100 MG tablet Take 100 mg by mouth 2 (two) times daily.      . Multiple Vitamins-Minerals (CENTRUM SILVER ADULT 50+ PO) Take 1 tablet by mouth daily.      . nitroGLYCERIN (NITROSTAT) 0.4 MG SL tablet Place 0.4 mg under the tongue every 5 (five) minutes as needed. For chest pain      . vardenafil (LEVITRA) 20 MG tablet Take 20 mg by mouth daily as needed.      . vitamin C (ASCORBIC ACID) 500 MG tablet Take 500 mg by mouth daily.      Marland Kitchen warfarin (COUMADIN) 2.5  MG tablet USE AS DIRECTED BY ANTICOAGULATION      CLINIC  120 tablet  1  . zolpidem (AMBIEN) 10 MG tablet TAKE ONE TABLET AT BEDTIME AS           NEEDED FOR SLEEP  30 tablet  5    Allergies  Allergen Reactions  . Salmon (Fish Allergy) Other (See Comments)    Only canned/ not fresh (probably preservatives).     Review of Systems negative except from HPI and PMH  Physical Exam BP 110/80  Pulse 79  Ht 5\' 11"  (1.803 m)  Wt 185 lb (83.915 kg)  BMI 25.80 kg/m2 Well developed and well nourished in no acute distress HENT normal E scleral and icterus clear Neck Supple JVP flat; carotids brisk and full Clear to ausculation Regular rate and rhythm, no murmurs  gallops or rub Soft with active bowel sounds No clubbing cyanosis no Edema Alert and oriented, grossly normal motor and sensory function Skin Warm and Dry    Assessment and  Plan

## 2012-03-22 NOTE — OR Nursing (Signed)
Meal served 

## 2012-03-22 NOTE — CV Procedure (Signed)
   Cardiac Catheterization Procedure Note  Name: [name] MRN: [id number] DOB: 1945/09/06  Procedure: Left Heart Cath, Selective Coronary Angiography, LV angiography  Indication: VF. ICD shock. Known cardiomyopathy, CAD, and prior stenting.  Procedural details: The right groin was prepped, draped, and anesthetized with 1% lidocaine. Using modified Seldinger technique, a 4 French sheath was introduced into the right femoral artery. Standard Judkins catheters were used for coronary angiography and left ventriculography. Catheter exchanges were performed over a guidewire. There were no immediate procedural complications. The patient was transferred to the post catheterization recovery area for further monitoring.  Procedural Findings: Hemodynamics:  AO 94/58 LV 92/15   Coronary angiography: Coronary dominance: right  Left mainstem: The left mainstem is patent without obstructive disease. There is mild to moderate calcification present and mild irregularity of the distal left main.  Left anterior descending (LAD): The LAD is patent to the apex. There is mild plaque at the ostium. The stented segment in the proximal LAD is patent with diffuse irregularity but no significant stenosis. The mid and distal vessels have no significant obstructive disease. The diagonal branches are patent  Left circumflex (LCx): The left circumflex is patent. There is mild proximal and ostial stenosis. The stented segment in the proximal vessel is widely patent. The obtuse marginal branches are patent.  Right coronary artery (RCA): This is a moderate caliber vessel. The PDA and PLA branches are patent. The proximal stent has minor irregularity without significant stenosis. There are no more than 20-30% stenoses in the proximal and mid vessel. The distal vessel is widely patent as are the branch vessels of the RCA.  Left ventriculography: Left ventricular function is severely depressed. There is dense akinesis  of the inferolateral wall. The anterior wall is hypokinetic. The left ventricular ejection fraction is estimated at 35%.  Final Conclusions:   1. Mild nonobstructive coronary artery disease with continued patency of the stented segments in the LAD, left circumflex, and right coronary arteries 2. Severe segmental LV systolic dysfunction.  Recommendations: Further discussion of Tikosyn admission for rhythm management of atrial fibrillation. CAD does not seem to be an issue at with continued stent patency.  [name] 03/22/2012, 12:14 PM

## 2012-03-22 NOTE — Interval H&P Note (Signed)
History and Physical Interval Note:  03/22/2012 12:12 PM  [name]  has presented today for surgery, with the diagnosis of  VF  The various methods of treatment have been discussed with the patient and family. After consideration of risks, benefits and other options for treatment, the patient has consented to  Procedure(s) (LRB) with comments: JV LEFT HEART CATHETERIZATION WITH CORONARY ANGIOGRAM (N/A) as a surgical intervention .  The patient's history has been reviewed, patient examined, no change in status, stable for surgery.  I have reviewed the patient's chart and labs.  Questions were answered to the patient's satisfaction.     Tonny Bollman

## 2012-03-22 NOTE — Progress Notes (Signed)
Allen's test performed on right hand performed with negative results.

## 2012-03-22 NOTE — Progress Notes (Signed)
Bedrest begins @ 1220, tegaderm dressing applied to right groin site by Army Melia, site level 0.

## 2012-03-23 MED ORDER — ZOLPIDEM TARTRATE 10 MG PO TABS: 5.0000 mg | ORAL_TABLET | Freq: Every evening | ORAL | Status: DC | PRN

## 2012-03-26 ENCOUNTER — Ambulatory Visit (INDEPENDENT_AMBULATORY_CARE_PROVIDER_SITE_OTHER): Payer: BC Managed Care – PPO | Admitting: Internal Medicine

## 2012-03-26 ENCOUNTER — Encounter: Payer: Self-pay | Admitting: Internal Medicine

## 2012-03-26 VITALS — BP 112/76 | HR 60 | Ht 71.0 in | Wt 184.0 lb

## 2012-03-26 DIAGNOSIS — I4901 Ventricular fibrillation: Secondary | ICD-10-CM

## 2012-03-26 DIAGNOSIS — I5022 Chronic systolic (congestive) heart failure: Secondary | ICD-10-CM

## 2012-03-26 DIAGNOSIS — I4891 Unspecified atrial fibrillation: Secondary | ICD-10-CM

## 2012-03-26 DIAGNOSIS — Z95810 Presence of automatic (implantable) cardiac defibrillator: Secondary | ICD-10-CM

## 2012-03-26 LAB — ICD DEVICE OBSERVATION
ATRIAL PACING ICD: 0.1 pct
DEV-0020ICD: NEGATIVE
TZAT-0001FASTVT: 1
TZAT-0002ATACH: NEGATIVE
TZAT-0002ATACH: NEGATIVE
TZAT-0002ATACH: NEGATIVE
TZAT-0004SLOWVT: 8
TZAT-0011SLOWVT: 10 ms
TZAT-0012FASTVT: 200 ms
TZAT-0012SLOWVT: 200 ms
TZAT-0013SLOWVT: 3
TZAT-0018ATACH: NEGATIVE
TZAT-0018FASTVT: NEGATIVE
TZAT-0019ATACH: 6 V
TZAT-0019ATACH: 6 V
TZAT-0019ATACH: 6 V
TZAT-0020FASTVT: 1.5 ms
TZON-0003FASTVT: 250 ms
TZON-0003SLOWVT: 280 ms
TZON-0003VSLOWVT: 450 ms
TZON-0004SLOWVT: 40
TZON-0004VSLOWVT: 20
TZON-0010FASTVT: 30 ms
TZST-0001ATACH: 5
TZST-0001FASTVT: 2
TZST-0001FASTVT: 6
TZST-0001SLOWVT: 3
TZST-0001SLOWVT: 5
TZST-0002ATACH: NEGATIVE
TZST-0002ATACH: NEGATIVE
TZST-0002FASTVT: NEGATIVE
TZST-0002FASTVT: NEGATIVE
TZST-0002FASTVT: NEGATIVE
TZST-0003SLOWVT: 20 J
TZST-0003SLOWVT: 35 J

## 2012-03-26 MED ORDER — AMIODARONE HCL 400 MG PO TABS: ORAL_TABLET | ORAL | Status: DC

## 2012-03-26 NOTE — Assessment & Plan Note (Signed)
The patient's device was interrogated.  The information was reviewed. No changes were made in the programming.    

## 2012-03-26 NOTE — Progress Notes (Signed)
Patient Care Team: Jacques Navy, MD as PCP - General   HPI  [name] is a 67 y.o. male Seen in followup for atrial fibrillation and aborted cardiac arrest. He has a history of ischemic heart disease and underwent recent catheterization demonstrating no significant obstruction; his ejection fraction remains at 35%.  He is on ACE inhibitors and beta blockers.  he is here to discuss antiarrhythmic therapy For his atrial fibrillation. At the last visit we discussed dofetilide. Past Medical History  Diagnosis Date  . Ischemic cardiomyopathy 09/22/2008    DES CX and RCA  70% residual LAD  Done in [place]; EF 30%;; myoviewq 2008 no ischemia  . Implantable cardiac defibrillator MDT 09/22/2008    Change out feb 2011  . NEPHROLITHIASIS, HX OF 02/25/2008  . CALLUS, RIGHT FOOT 12/08/2009  . Chronic systolic heart failure 11/03/2009  . Atrial fibrillation 11/25/2006    Inapp shock 2012 AF VR >220 recurrent  . HYPERLIPIDEMIA 11/25/2006  . HYPOGONADISM, MALE 12/05/2006  . 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 DIABETES MELLITUS, TYPE II 12/05/2006  . 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 Long term (current) use of anticoagulants 04/25/2010    Past Surgical History  Procedure Date  . Cataract extraction   . Tonsillectomy   . Cardiac defibrillator placement     Guidant Vitality [name]  . Ptca     Current Outpatient Prescriptions  Medication Sig Dispense Refill  . ANDROGEL PUMP 20.25 MG/ACT (1.62%) GEL       . b complex vitamins tablet Take 1 tablet by mouth daily.      . citalopram (CELEXA) 20 MG tablet Take one tablet by mouth once daily  30 tablet  3  . lisinopril (PRINIVIL,ZESTRIL) 5 MG tablet TAKE ONE TABLET EVERY DAY  90 tablet  1  . loratadine (CLARITIN) 10 MG tablet Take 10 mg by mouth daily.      . metFORMIN (GLUCOPHAGE) 1000 MG tablet TAKE ONE TABLET TWICE DAILY FOR DIABETES  100 tablet  2  . metoprolol (LOPRESSOR) 100 MG tablet Take 100 mg by mouth 2 (two) times  daily.      . Multiple Vitamins-Minerals (CENTRUM SILVER ADULT 50+ PO) Take 1 tablet by mouth daily.      . nitroGLYCERIN (NITROSTAT) 0.4 MG SL tablet Place 0.4 mg under the tongue every 5 (five) minutes as needed. For chest pain      . vardenafil (LEVITRA) 20 MG tablet Take 20 mg by mouth daily as needed.      . vitamin C (ASCORBIC ACID) 500 MG tablet Take 500 mg by mouth daily.      Marland Kitchen warfarin (COUMADIN) 2.5 MG tablet USE AS DIRECTED BY ANTICOAGULATION      CLINIC  120 tablet  1  . zolpidem (AMBIEN) 10 MG tablet Take 0.5 tablets (5 mg total) by mouth at bedtime as needed for sleep.  30 tablet  5    Allergies  Allergen Reactions  . Salmon (Fish Allergy) Other (See Comments)    Only canned/ not fresh (probably preservatives).     Review of Systems negative except from HPI and PMH  Physical Exam BP 112/76  Pulse 60  Ht 5\' 11"  (1.803 m)  Wt 184 lb (83.462 kg)  BMI 25.66 kg/m2  SpO2 96% Well developed and well nourished in no acute distress HENT normal E scleral and icterus clear Neck Supple JVP flat; carotids brisk and full Clear to ausculation  Regular rate and rhythm, no  murmurs gallops or rub Soft with active bowel sounds No clubbing cyanosis none Edema Alert and oriented, grossly normal motor and sensory function Skin Warm and Dry    Assessment and  Plan

## 2012-03-26 NOTE — Assessment & Plan Note (Signed)
We discussed dofetilide versus amiodarone. Initially I had thought in terms of the former; however, given the of the costs we have discussed using amiodarone. We have discussed the potential side effects including liver thyroid lung and the fact that they are mostly, but not always-particularly lung, reversible. We will get PFTs and a DLCO for baseline. He will need to have his Coumadin carefully monitored because of a drug interaction and we'll anticipate cardioversion in 4 weeks.  Amiodarone 400 twice daily for 2 weeks then 400 daily.

## 2012-03-26 NOTE — Assessment & Plan Note (Signed)
No recurrent ventricular arrhythmias. Catheterization demonstrated no obstructive disease.

## 2012-03-26 NOTE — Assessment & Plan Note (Signed)
Stable. I would anticipate initiation of Aldactone in the next month or 2

## 2012-03-26 NOTE — Patient Instructions (Addendum)
Remote monitoring is used to monitor your Pacemaker of ICD from home. This monitoring reduces the number of office visits required to check your device to one time per year. It allows [place] to keep an eye on the functioning of your device to ensure it is working properly. You are scheduled for a device check from home on June 26, 2011. You may send your transmission at any time that day. If you have a wireless device, the transmission will be sent automatically. After your physician reviews your transmission, you will receive a postcard with your next transmission date.  Your physician has recommended that you have a pulmonary function test. Pulmonary Function Tests are a group of tests that measure how well air moves in and out of your lungs.  Your physician has recommended you make the following change in your medication: START Amiodarone 400 mg twice daily for 2 weeks then take once daily for 2 weeks  Your physician has recommended that you have a Cardioversion (DCCV). Electrical Cardioversion uses a jolt of electricity to your heart either through paddles or wired patches attached to your chest. This is a controlled, usually prescheduled, procedure. Defibrillation is done under light anesthesia in the hospital, and you usually go home the day of the procedure. This is done to get your heart back into a normal rhythm. You are not awake for the procedure. Please see the instruction sheet given to you today.  Your physician recommends that you schedule a follow-up appointment in: 1 week with CV[facility]

## 2012-03-28 ENCOUNTER — Ambulatory Visit (HOSPITAL_COMMUNITY)
Admission: RE | Admit: 2012-03-28 | Discharge: 2012-03-28 | Disposition: A | Discharge status: Home / Self Care | Payer: BC Managed Care – PPO | Source: Ambulatory Visit | Attending: Internal Medicine | Admitting: Internal Medicine

## 2012-03-28 DIAGNOSIS — I4891 Unspecified atrial fibrillation: Secondary | ICD-10-CM

## 2012-03-28 DIAGNOSIS — Z79899 Other long term (current) drug therapy: Secondary | ICD-10-CM | POA: Insufficient documentation

## 2012-04-03 ENCOUNTER — Ambulatory Visit (INDEPENDENT_AMBULATORY_CARE_PROVIDER_SITE_OTHER): Payer: BC Managed Care – PPO | Admitting: *Deleted

## 2012-04-03 DIAGNOSIS — I4891 Unspecified atrial fibrillation: Secondary | ICD-10-CM

## 2012-04-03 DIAGNOSIS — Z7901 Long term (current) use of anticoagulants: Secondary | ICD-10-CM

## 2012-04-03 LAB — POCT INR: INR: 2.8

## 2012-04-06 ENCOUNTER — Encounter: Payer: Self-pay | Admitting: Internal Medicine

## 2012-04-10 ENCOUNTER — Ambulatory Visit (INDEPENDENT_AMBULATORY_CARE_PROVIDER_SITE_OTHER): Payer: BC Managed Care – PPO

## 2012-04-10 DIAGNOSIS — I4891 Unspecified atrial fibrillation: Secondary | ICD-10-CM

## 2012-04-10 DIAGNOSIS — Z7901 Long term (current) use of anticoagulants: Secondary | ICD-10-CM

## 2012-04-13 ENCOUNTER — Telehealth: Payer: Self-pay | Admitting: Internal Medicine

## 2012-04-13 NOTE — Telephone Encounter (Signed)
Thinks he may have passed a kidney stone: had pain and cola colored urine. The pain is now resolved except for minor residual pain in the testicle. He does have toradol and flomax on hand.  He is advised to take toradol for pain. Will see him next week.

## 2012-04-16 NOTE — Telephone Encounter (Signed)
[facility] Triage Call Report Triage Record Num: [id number] Operator: Merlinda Frederick Patient Name: [name] Call Date & Time: 04/12/2012 10:48:04AM Patient Phone: [phone] PCP: Illene Regulus Patient Gender: Male PCP Fax : [phone] Patient DOB: 1945/07/25 Practice Name: [facility] Reason for Call: Caller: [name]/Patient; PCP: Illene Regulus (Adults only); CB#: [phone]; Call regarding Urinary Pain/Bleeding; Pt reports 2-3 day hx of L flank pain, no flank pain today. Has hx of kidney stones. Today, 04/12/12 report suprapubic pain, rates pain as 1/10. Pt also reports that urine is "brown, tea-colored" urine. Afebrile. Pt is on Coumadin for A-fib. Triaged per Bloody Urine Guideline, see in 4 hr disposition for "taking blood thinner." All urgent cares in area closed, pt declines ED due to cost. S/w Dr. Jonny Ruiz, who encourage pt to be seen at the ED today for a PT/INR, if pt still declines, monitor urine and encourage fluids. Advised pt of this, he continues to decline ED, will call back in am 04/13/12 to see if office will be open. Advised pt of care advice and call back parameters per guideline, verbalized understanding. Protocol(s) Used: Bloody Urine Protocol(s) Used: Urinary Symptoms - Male Recommended Outcome per Protocol: See Provider within 4 hours Reason for Outcome: Blood in urine Diagnosed sickle cell disease, thalassemia, bleeding disorder or taking blood thinner Care Advice: ~ Another adult should drive. Call EMS 911 if signs and symptoms of shock develop (such as unable to stand due to faintness, dizziness, or lightheadedness; new onset of confusion; slow to respond or difficult to awaken; skin is pale, gray, cool, or moist to touch; severe weakness; loss of consciousness). ~ Call provider if having fatigue, shortness of breath, or jaundice. Total water intake includes drinking water, water in beverages, and water contained in food. Fluids make up about 80% of the  body's total hydration need. Individual fluid requirement to maintain hydration vary based on physical activity, environmental factors and illness. Limit fluids that contain sugar, caffeine, or alcohol. Urine will be very light yellow color when you drink enough fluids.

## 2012-04-17 ENCOUNTER — Ambulatory Visit (INDEPENDENT_AMBULATORY_CARE_PROVIDER_SITE_OTHER): Payer: BC Managed Care – PPO | Admitting: *Deleted

## 2012-04-17 DIAGNOSIS — I4891 Unspecified atrial fibrillation: Secondary | ICD-10-CM

## 2012-04-17 DIAGNOSIS — Z7901 Long term (current) use of anticoagulants: Secondary | ICD-10-CM

## 2012-04-18 ENCOUNTER — Encounter: Payer: Self-pay | Admitting: Internal Medicine

## 2012-04-18 ENCOUNTER — Ambulatory Visit (INDEPENDENT_AMBULATORY_CARE_PROVIDER_SITE_OTHER): Payer: BC Managed Care – PPO | Admitting: Internal Medicine

## 2012-04-18 VITALS — BP 116/68 | HR 82 | Temp 97.1°F | Resp 12 | Wt 189.0 lb

## 2012-04-18 DIAGNOSIS — N200 Calculus of kidney: Secondary | ICD-10-CM

## 2012-04-18 NOTE — Patient Instructions (Addendum)
Kidney stone - it sounds like a stone and that you have passed it. There is no value in any imaging studies.  Constipation - take Milk of Magnesia 30 cc every 4 hours until BM or 4 doses in 24 hrs.  Heart related issues: your event was ventricular fibrillation leading to sudden loss of consciousness with defibrillation by your ICD which saved your life.  Stay well - enjoy every minute.

## 2012-04-18 NOTE — Progress Notes (Signed)
Subjective:    Patient ID: [name], male    DOB: 1945-06-20, 67 y.o.   MRN: [id number]  HPI Patient had called over the weekend with pain that was c/w nephrolithiasis. He was prescribed ketorolac for pain and was told to take left over flomax. He feels that he has passed the stone - pain resolved.   He does c/o constipation. Wonders if this is related to kidney stones or other medications.  He is on amiodarone for a. Fib and is for cardioversion in the near future.   Past Medical History  Diagnosis Date  . Ischemic cardiomyopathy 09/22/2008    DES CX and RCA  70% residual LAD  Done in [place]; EF 30%;; myoviewq 2008 no ischemia  . Implantable cardiac defibrillator MDT 09/22/2008    Change out feb 2011  . NEPHROLITHIASIS, HX OF 02/25/2008  . CALLUS, RIGHT FOOT 12/08/2009  . Chronic systolic heart failure 11/03/2009  . Atrial fibrillation 11/25/2006    Inapp shock 2012 AF VR >220 recurrent  . HYPERLIPIDEMIA 11/25/2006  . HYPOGONADISM, MALE 12/05/2006  . 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 DIABETES MELLITUS, TYPE II 12/05/2006  . 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 Long term (current) use of anticoagulants 04/25/2010   Past Surgical History  Procedure Laterality Date  . Cataract extraction    . Tonsillectomy    . Cardiac defibrillator placement      Guidant Vitality [name]  . Ptca     Family History  Problem Relation Age of Onset  . Diabetes Maternal Grandmother   . Hyperlipidemia Maternal Grandmother   . Cancer Neg Hx   . COPD Neg Hx   . Heart disease Neg Hx    History   Social History  . Marital Status: Divorced    Spouse Name: N/A    Number of Children: 1  . Years of Education: 16   Occupational History  . 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 Smoking status: Never Smoker   . Smokeless tobacco: Never Used  . Alcohol Use: Yes  . Drug Use: No  . Sexually Active: Yes -- Male partner(s)   Other  Topics Concern  . Not on file   Social History Narrative   HS[employer], College grad. Married - divorced. 1 daughter. owner/operator Optician, dispensing business and show production    Current Outpatient Prescriptions on File Prior to Visit  Medication Sig Dispense Refill  . amiodarone (PACERONE) 400 MG tablet Take 1 tab twice daily for 2 weeks then reduce to once daily for 2 weeks  90 tablet  1  . ANDROGEL PUMP 20.25 MG/ACT (1.62%) GEL       . b complex vitamins tablet Take 1 tablet by mouth daily.      . citalopram (CELEXA) 20 MG tablet Take one tablet by mouth once daily  30 tablet  3  . ketorolac (TORADOL) 10 MG tablet Take 10 mg by mouth daily after supper.      Marland Kitchen lisinopril (PRINIVIL,ZESTRIL) 5 MG tablet TAKE ONE TABLET EVERY DAY  90 tablet  1  . loratadine (CLARITIN) 10 MG tablet Take 10 mg by mouth daily.      . metFORMIN (GLUCOPHAGE) 1000 MG tablet TAKE ONE TABLET TWICE DAILY FOR DIABETES  100 tablet  2  . metoprolol (LOPRESSOR) 100 MG tablet Take 100 mg by mouth 2 (two) times daily.      . Multiple Vitamins-Minerals (CENTRUM SILVER ADULT 50+  PO) Take 1 tablet by mouth daily.      . nitroGLYCERIN (NITROSTAT) 0.4 MG SL tablet Place 0.4 mg under the tongue every 5 (five) minutes as needed. For chest pain      . Tamsulosin HCl (FLOMAX) 0.4 MG CAPS Take 0.4 mg by mouth daily after supper. Taking every day when needed      . vardenafil (LEVITRA) 20 MG tablet Take 20 mg by mouth daily as needed.      . vitamin C (ASCORBIC ACID) 500 MG tablet Take 500 mg by mouth daily.      Marland Kitchen warfarin (COUMADIN) 2.5 MG tablet USE AS DIRECTED BY ANTICOAGULATION      CLINIC  120 tablet  1  . zolpidem (AMBIEN) 10 MG tablet Take 0.5 tablets (5 mg total) by mouth at bedtime as needed for sleep.  30 tablet  5   No current facility-administered medications on file prior to visit.      Review of Systems System review is negative for any constitutional, cardiac, pulmonary, GI or neuro symptoms or complaints other than as  described in the HPI.     Objective:   Physical Exam Filed Vitals:   04/18/12 1106  BP: 116/68  Pulse: 82  Temp: 97.1 F (36.2 C)  Resp: 12   Wt Readings from Last 3 Encounters:  04/18/12 189 lb (85.73 kg)  03/26/12 184 lb (83.462 kg)  03/22/12 185 lb (83.915 kg)   Gen'l - WNWD white man in no distress Cor- IRIR rate controlled Pulm - normal respirations Abd - BS+ x 4, no HSM, no guarding or rebound. Minimal tenderness to palpation at the level of the umbilicus bilaterally. No palpable defect (hernia) left groin or inguinal canal. Normal left testicle. Neuro - A&O x 3.      Assessment & Plan:

## 2012-04-19 DIAGNOSIS — N200 Calculus of kidney: Secondary | ICD-10-CM | POA: Insufficient documentation

## 2012-04-19 NOTE — Assessment & Plan Note (Signed)
Recent episode of abdominal pain with radiation to the testicle very suggestive by nature of  Nephrolithiasis. Over the past weekend toradol and flomax were called in and he reports that he thinks he passed a stone. He recalls seeing a very small blood clot when he urinated and then the pain went away. Exam is benign  Plan Hydration  Consider acidifying urine.

## 2012-04-23 ENCOUNTER — Telehealth: Payer: Self-pay | Admitting: Internal Medicine

## 2012-04-23 NOTE — Telephone Encounter (Signed)
New problem    1. C/O not feeling well last night. Chest pain took nitro tablet .   2. Dr. Graciela Husbands call him unable to read the message.

## 2012-04-23 NOTE — Telephone Encounter (Signed)
Pt called because he said Dr. Graciela Husbands called him and left a message last week, but the message was cut off in the middle  ; so he was not able to hear the whole message. Pt would like to know what Dr. Graciela Husbands wanted to tell him. Pt also states last night had some chest tightness, he was not able to sleep well, pt lay on a reclined position he  can't sleep  Lying in bed because he whizzes and coughs when he does. Pt states took a NTG SL so he felt better and  was able to sleep two or three hours. Pt took her BP in about one to two hours intervals last night. Pt sates his BP was ranging from 168/107 to 112/70. This morning his BP was 149/87 prior taken this morning medications. Pt was advised to take his BP about two hours after taken his medication. Pt also aware to not check his medication so often, because if he is worry about too much his pressure will go up. Pt states he feels better this morning; but would like to know what DR. Graciela Husbands wanted to tell him . Pt will have the  last coumadin check tomorrow before his cardioversion.

## 2012-04-24 ENCOUNTER — Ambulatory Visit (INDEPENDENT_AMBULATORY_CARE_PROVIDER_SITE_OTHER): Payer: BC Managed Care – PPO | Admitting: *Deleted

## 2012-04-24 ENCOUNTER — Encounter: Payer: Self-pay | Admitting: *Deleted

## 2012-04-24 ENCOUNTER — Other Ambulatory Visit: Payer: Self-pay | Admitting: Internal Medicine

## 2012-04-24 DIAGNOSIS — I4891 Unspecified atrial fibrillation: Secondary | ICD-10-CM

## 2012-04-24 DIAGNOSIS — Z7901 Long term (current) use of anticoagulants: Secondary | ICD-10-CM

## 2012-04-24 NOTE — Telephone Encounter (Signed)
The patient was here for a coumadin check today. This was his 4th therapeutic INR. The patient was scheduled for his DCCV for 2/26 with Dr. Patty Sermons at 9:00 am. Instructions given to the patient and he verbalizes understanding.

## 2012-04-25 ENCOUNTER — Encounter (HOSPITAL_COMMUNITY): Payer: Self-pay | Admitting: *Deleted

## 2012-04-25 ENCOUNTER — Ambulatory Visit (HOSPITAL_COMMUNITY): Payer: BC Managed Care – PPO | Admitting: Certified Registered"

## 2012-04-25 ENCOUNTER — Ambulatory Visit (HOSPITAL_COMMUNITY)
Admission: RE | Admit: 2012-04-25 | Discharge: 2012-04-25 | Disposition: A | Discharge status: Home / Self Care | Payer: BC Managed Care – PPO | Source: Ambulatory Visit | Attending: Cardiology | Admitting: Cardiology

## 2012-04-25 ENCOUNTER — Encounter (HOSPITAL_COMMUNITY)
Admission: RE | Disposition: A | Discharge status: Home / Self Care | Payer: Self-pay | Source: Ambulatory Visit | Attending: Cardiology

## 2012-04-25 ENCOUNTER — Encounter (HOSPITAL_COMMUNITY): Payer: Self-pay | Admitting: Certified Registered"

## 2012-04-25 DIAGNOSIS — I4891 Unspecified atrial fibrillation: Secondary | ICD-10-CM | POA: Insufficient documentation

## 2012-04-25 DIAGNOSIS — I252 Old myocardial infarction: Secondary | ICD-10-CM | POA: Insufficient documentation

## 2012-04-25 DIAGNOSIS — F3289 Other specified depressive episodes: Secondary | ICD-10-CM | POA: Insufficient documentation

## 2012-04-25 DIAGNOSIS — E119 Type 2 diabetes mellitus without complications: Secondary | ICD-10-CM | POA: Insufficient documentation

## 2012-04-25 DIAGNOSIS — F329 Major depressive disorder, single episode, unspecified: Secondary | ICD-10-CM | POA: Insufficient documentation

## 2012-04-25 DIAGNOSIS — Z7901 Long term (current) use of anticoagulants: Secondary | ICD-10-CM | POA: Insufficient documentation

## 2012-04-25 DIAGNOSIS — Z79899 Other long term (current) drug therapy: Secondary | ICD-10-CM | POA: Insufficient documentation

## 2012-04-25 HISTORY — DX: Acute myocardial infarction, unspecified: I21.9

## 2012-04-25 HISTORY — PX: CARDIOVERSION: SHX1299

## 2012-04-25 HISTORY — DX: Calculus of kidney: N20.0

## 2012-04-25 LAB — GLUCOSE, CAPILLARY: Glucose-Capillary: 144 mg/dL — ABNORMAL HIGH (ref 70–99)

## 2012-04-25 SURGERY — CARDIOVERSION
Anesthesia: Monitor Anesthesia Care

## 2012-04-25 MED ORDER — LIDOCAINE HCL (CARDIAC) 20 MG/ML IV SOLN
INTRAVENOUS | Status: DC | PRN
  Administered 2012-04-25: 40 mg via INTRAVENOUS

## 2012-04-25 MED ORDER — PROPOFOL 10 MG/ML IV BOLUS
INTRAVENOUS | Status: DC | PRN
  Administered 2012-04-25: 120 mg via INTRAVENOUS

## 2012-04-25 MED ORDER — SODIUM CHLORIDE 0.9 % IV SOLN
INTRAVENOUS | Status: DC
  Administered 2012-04-25: 09:00:00 via INTRAVENOUS

## 2012-04-25 NOTE — Transfer of Care (Signed)
Immediate Anesthesia Transfer of Care Note  Patient: [name]  Procedure(s) Performed: Procedure(s): CARDIOVERSION (N/A)  Patient Location: Endoscopy Unit  Anesthesia Type:General  Level of Consciousness: sedated and responds to stimulation  Airway & Oxygen Therapy: Patient Spontanous Breathing and Patient connected to nasal cannula oxygen  Post-op Assessment: Report given to PACU RN, Post -op Vital signs reviewed and stable and Patient moving all extremities  Post vital signs: Reviewed and stable  Complications: No apparent anesthesia complications

## 2012-04-25 NOTE — Anesthesia Preprocedure Evaluation (Signed)
Anesthesia Evaluation  Patient identified by MRN, date of birth, ID band Patient awake    Reviewed: Allergy & Precautions, H&P , NPO status , Patient's Chart, lab work & pertinent test results  Airway Mallampati: I TM Distance: >3 FB Neck ROM: Full    Dental   Pulmonary  breath sounds clear to auscultation        Cardiovascular + Past MI + dysrhythmias Atrial Fibrillation + Cardiac Defibrillator Rhythm:Irregular Rate:Normal     Neuro/Psych Depression    GI/Hepatic   Endo/Other  diabetes  Renal/GU Renal InsufficiencyRenal disease     Musculoskeletal   Abdominal   Peds  Hematology   Anesthesia Other Findings   Reproductive/Obstetrics                           Anesthesia Physical Anesthesia Plan  ASA: III  Anesthesia Plan: General   Post-op Pain Management:    Induction: Intravenous  Airway Management Planned: Mask  Additional Equipment:   Intra-op Plan:   Post-operative Plan:   Informed Consent: I have reviewed the patients History and Physical, chart, labs and discussed the procedure including the risks, benefits and alternatives for the proposed anesthesia with the patient or authorized representative who has indicated his/her understanding and acceptance.     Plan Discussed with: CRNA and Surgeon  Anesthesia Plan Comments:         Anesthesia Quick Evaluation

## 2012-04-25 NOTE — Anesthesia Postprocedure Evaluation (Signed)
  Anesthesia Post-op Note  Patient: [name]  Procedure(s) Performed: Procedure(s): CARDIOVERSION (N/A)  Patient Location: PACU and Endoscopy Unit  Anesthesia Type:General  Level of Consciousness: awake  Airway and Oxygen Therapy: Patient Spontanous Breathing  Post-op Pain: none  Post-op Assessment: Post-op Vital signs reviewed, Patient's Cardiovascular Status Stable, Respiratory Function Stable, Patent Airway, No signs of Nausea or vomiting and Pain level controlled  Post-op Vital Signs: stable  Complications: No apparent anesthesia complications

## 2012-04-25 NOTE — Anesthesia Procedure Notes (Signed)
Date/Time: 04/25/2012 9:18 AM Performed by: Jerilee Hoh Pre-anesthesia Checklist: Patient identified, Emergency Drugs available, Suction available, Patient being monitored and Timeout performed Patient Re-evaluated:Patient Re-evaluated prior to inductionOxygen Delivery Method: Ambu bag Preoxygenation: Pre-oxygenation with 100% oxygen Intubation Type: IV induction Ventilation: Mask ventilation without difficulty and Oral airway inserted - appropriate to patient size Dental Injury: Teeth and Oropharynx as per pre-operative assessment

## 2012-04-25 NOTE — CV Procedure (Signed)
Electrical Cardioversion Procedure Note NATHON STEFANSKI [id number] 1945-10-03  Procedure: Electrical Cardioversion Indications:  Atrial Fibrillation  Procedure Details Consent: Risks of procedure as well as the alternatives and risks of each were explained to the (patient/caregiver).  Consent for procedure obtained. Time Out: Verified patient identification, verified procedure, site/side was marked, verified correct patient position, special equipment/implants available, medications/allergies/relevent history reviewed, required imaging and test results available.  Performed  Patient placed on cardiac monitor, pulse oximetry, supplemental oxygen as necessary.  Sedation given: propofol Pacer pads placed anterior and posterior chest.  Cardioverted 1 time(s).  Cardioverted at 120J.  Evaluation Findings: Post procedure EKG shows: NSR Complications: None Patient did tolerate procedure well.   Cassell Clement 04/25/2012, 9:28 AM

## 2012-04-25 NOTE — Preoperative (Signed)
Beta Blockers   Reason not to administer Beta Blockers:Not Applicable 

## 2012-04-25 NOTE — H&P (Signed)
Interval history. Chart reviewed. No change since last seen in office.

## 2012-04-26 ENCOUNTER — Encounter (HOSPITAL_COMMUNITY): Payer: Self-pay | Admitting: Cardiology

## 2012-05-01 ENCOUNTER — Ambulatory Visit (INDEPENDENT_AMBULATORY_CARE_PROVIDER_SITE_OTHER): Payer: BC Managed Care – PPO | Admitting: *Deleted

## 2012-05-01 DIAGNOSIS — Z7901 Long term (current) use of anticoagulants: Secondary | ICD-10-CM

## 2012-05-01 DIAGNOSIS — I4891 Unspecified atrial fibrillation: Secondary | ICD-10-CM

## 2012-05-01 LAB — POCT INR: INR: 4.4

## 2012-05-15 ENCOUNTER — Ambulatory Visit (INDEPENDENT_AMBULATORY_CARE_PROVIDER_SITE_OTHER): Payer: BC Managed Care – PPO | Admitting: Internal Medicine

## 2012-05-15 ENCOUNTER — Ambulatory Visit (INDEPENDENT_AMBULATORY_CARE_PROVIDER_SITE_OTHER): Payer: BC Managed Care – PPO | Admitting: *Deleted

## 2012-05-15 ENCOUNTER — Encounter: Payer: Self-pay | Admitting: Internal Medicine

## 2012-05-15 ENCOUNTER — Telehealth: Payer: Self-pay | Admitting: *Deleted

## 2012-05-15 ENCOUNTER — Ambulatory Visit (INDEPENDENT_AMBULATORY_CARE_PROVIDER_SITE_OTHER)
Admission: RE | Admit: 2012-05-15 | Discharge: 2012-05-15 | Disposition: A | Discharge status: Home / Self Care | Payer: BC Managed Care – PPO | Source: Ambulatory Visit | Attending: Internal Medicine | Admitting: Internal Medicine

## 2012-05-15 ENCOUNTER — Encounter: Payer: Self-pay | Admitting: *Deleted

## 2012-05-15 VITALS — BP 126/87 | HR 84 | Ht 71.0 in | Wt 187.8 lb

## 2012-05-15 DIAGNOSIS — Z7901 Long term (current) use of anticoagulants: Secondary | ICD-10-CM

## 2012-05-15 DIAGNOSIS — Z7189 Other specified counseling: Secondary | ICD-10-CM

## 2012-05-15 DIAGNOSIS — I4891 Unspecified atrial fibrillation: Secondary | ICD-10-CM

## 2012-05-15 DIAGNOSIS — I4901 Ventricular fibrillation: Secondary | ICD-10-CM

## 2012-05-15 DIAGNOSIS — R55 Syncope and collapse: Secondary | ICD-10-CM

## 2012-05-15 DIAGNOSIS — Z95810 Presence of automatic (implantable) cardiac defibrillator: Secondary | ICD-10-CM

## 2012-05-15 DIAGNOSIS — I5022 Chronic systolic (congestive) heart failure: Secondary | ICD-10-CM

## 2012-05-15 LAB — ICD DEVICE OBSERVATION
AL AMPLITUDE: 4.6 mv
AL IMPEDENCE ICD: 494 Ohm
BAMS-0001: 170 {beats}/min
BATTERY VOLTAGE: 2.9412 V
BRDY-0002RV: 70 {beats}/min
FVT: 0
RV LEAD AMPLITUDE: 20 mv
RV LEAD IMPEDENCE ICD: 418 Ohm
RV LEAD THRESHOLD: 1 V
TOT-0006: 20110228000000
TZAT-0001ATACH: 3
TZAT-0001SLOWVT: 1
TZAT-0002FASTVT: NEGATIVE
TZAT-0004SLOWVT: 8
TZAT-0005SLOWVT: 88 pct
TZAT-0011SLOWVT: 10 ms
TZAT-0012ATACH: 150 ms
TZAT-0012ATACH: 150 ms
TZAT-0012ATACH: 150 ms
TZAT-0013SLOWVT: 3
TZAT-0018ATACH: NEGATIVE
TZAT-0018FASTVT: NEGATIVE
TZAT-0019ATACH: 6 V
TZAT-0019FASTVT: 8 V
TZAT-0020ATACH: 1.5 ms
TZAT-0020ATACH: 1.5 ms
TZAT-0020FASTVT: 1.5 ms
TZON-0003ATACH: 350 ms
TZON-0003FASTVT: 250 ms
TZON-0003VSLOWVT: 450 ms
TZON-0004SLOWVT: 40
TZST-0001ATACH: 4
TZST-0001ATACH: 6
TZST-0001FASTVT: 6
TZST-0001SLOWVT: 2
TZST-0001SLOWVT: 3
TZST-0001SLOWVT: 5
TZST-0002ATACH: NEGATIVE
TZST-0002FASTVT: NEGATIVE
TZST-0002FASTVT: NEGATIVE
TZST-0002FASTVT: NEGATIVE
TZST-0003SLOWVT: 35 J
TZST-0003SLOWVT: 35 J
VF: 0

## 2012-05-15 LAB — CBC WITH DIFFERENTIAL/PLATELET
Eosinophils Relative: 2.5 % (ref 0.0–5.0)
HCT: 44 % (ref 39.0–52.0)
Lymphocytes Relative: 19.9 % (ref 12.0–46.0)
Monocytes Relative: 10.1 % (ref 3.0–12.0)
Neutrophils Relative %: 67 % (ref 43.0–77.0)
Platelets: 226 10*3/uL (ref 150.0–400.0)
WBC: 8.6 10*3/uL (ref 4.5–10.5)

## 2012-05-15 LAB — BASIC METABOLIC PANEL
BUN: 21 mg/dL (ref 6–23)
Creatinine, Ser: 1.4 mg/dL (ref 0.4–1.5)
GFR: 53.25 mL/min — ABNORMAL LOW (ref >60.00)
Potassium: 5.8 mEq/L — ABNORMAL HIGH (ref 3.5–5.1)

## 2012-05-15 LAB — POCT INR: INR: 3

## 2012-05-15 NOTE — Progress Notes (Signed)
kf Patient Care Team: Jacques Navy, MD as PCP - General   HPI  [name] is a 67 y.o. maleseen in followup for aborted cardiac arrest. He has ischemic cardiomyopathy with most recent ejection fraction of 35%. He has recently undergone cardioversion following loading for one month with amiodarone. He chose not to undertake dofetilide.  He felt considerably better in sinus rhythm. He had better exercise tolerance and more clarity of thought.  The other night he fell. He has had a history of  Syncope and presyncope accompanied by epiphenomena consistent with neurally mediated syndrome. On this occasion he noted the next morning that there was excoriations and cuts on his head. He also had some problems playing the guitar with the left hand  but he thought that that was related to falling on his hand having excluded the fact that was probably not arthritis  Past Medical History  Diagnosis Date  . Ischemic cardiomyopathy 09/22/2008    DES CX and RCA  70% residual LAD  Done in [place]; EF 30%;; myoviewq 2006-07-26 no ischemia  . Implantable cardiac defibrillator MDT 09/22/2008    Change out feb 2011  . NEPHROLITHIASIS, HX OF 02/25/2008  . CALLUS, RIGHT FOOT 12/08/2009  . Chronic systolic heart failure 11/03/2009  . Atrial fibrillation 11/25/2006    Inapp shock 07-26-2010 AF VR >220 recurrent  . HYPERLIPIDEMIA 11/25/2006  . HYPOGONADISM, MALE 12/05/2006  . 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 DIABETES MELLITUS, TYPE II 12/05/2006  . 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 Long term (current) use of anticoagulants 04/25/2010  . ICD (implantable cardiac defibrillator) in place     medtronic  . Myocardial infarction 07-25-04    "I died and they brought me back"  . Kidney stones     Past Surgical History  Procedure Laterality Date  . Cataract extraction    . Tonsillectomy    . Cardiac defibrillator placement      Guidant Vitality [name]  . Ptca    . Tonsillectomy    . Eye surgery     cataracts  . Cardiac catheterization      feburary Jul 25, 2012  . Cardioversion N/A 04/25/2012    Procedure: CARDIOVERSION;  Surgeon: Cassell Clement, MD;  Location: [facility] ENDOSCOPY;  Service: Cardiovascular;  Laterality: N/A;    Current Outpatient Prescriptions  Medication Sig Dispense Refill  . amiodarone (PACERONE) 400 MG tablet Take 1 tab twice daily for 2 weeks then reduce to once daily for 2 weeks  90 tablet  1  . ANDROGEL PUMP 20.25 MG/ACT (1.62%) GEL       . b complex vitamins tablet Take 1 tablet by mouth daily.      Marland Kitchen ketorolac (TORADOL) 10 MG tablet Take 10 mg by mouth daily after supper.      Marland Kitchen lisinopril (PRINIVIL,ZESTRIL) 5 MG tablet TAKE ONE TABLET EVERY DAY  90 tablet  1  . loratadine (CLARITIN) 10 MG tablet Take 10 mg by mouth daily.      . metFORMIN (GLUCOPHAGE) 1000 MG tablet TAKE ONE TABLET TWICE DAILY FOR DIABETES  100 tablet  2  . metoprolol (LOPRESSOR) 100 MG tablet Take 1 tablet (100 mg total) by mouth 2 (two) times daily.  90 tablet  1  . metoprolol succinate (TOPROL-XL) 100 MG 24 hr tablet Take 100 mg by mouth daily. Take with or immediately following a meal.      . Multiple Vitamins-Minerals (CENTRUM SILVER ADULT 50+ PO) Take 1 tablet by mouth  daily.      . nitroGLYCERIN (NITROSTAT) 0.4 MG SL tablet Place 0.4 mg under the tongue every 5 (five) minutes as needed. For chest pain      . Tamsulosin HCl (FLOMAX) 0.4 MG CAPS Take 0.4 mg by mouth daily after supper. Taking every day when needed      . vardenafil (LEVITRA) 20 MG tablet Take 20 mg by mouth daily as needed.      . vitamin C (ASCORBIC ACID) 500 MG tablet Take 500 mg by mouth daily.      Marland Kitchen warfarin (COUMADIN) 2.5 MG tablet USE AS DIRECTED BY ANTICOAGULATION      CLINIC  120 tablet  1  . zolpidem (AMBIEN) 10 MG tablet Take 0.5 tablets (5 mg total) by mouth at bedtime as needed for sleep.  30 tablet  5   No current facility-administered medications for this visit.    Allergies  Allergen Reactions  . Salmon (Fish  Allergy) Other (See Comments)    Only canned/ not fresh (probably preservatives).     Review of Systems negative except from HPI and PMH  Physical Exam BP 126/87  Pulse 84  Ht 5\' 11"  (1.803 m)  Wt 187 lb 12.8 oz (85.186 kg)  BMI 26.2 kg/m2 Well developed and well nourished in no acute distress HENT excoriations and laceratioins on his scalp E scleral and icterus clear Neck Supple JVP flat; carotids brisk and full Clear to ausculation  Regular rate and rhythm, no murmurs gallops or rub Soft with active bowel sounds No clubbing cyanosis none Edema Alert and oriented, grossly normal motor and sensory function Skin Warm and Dry  ECG demonstrates ventricular pacing with underlying atrial arrhythmia.  Internal electrograms demonstrated atrial flutter at a cycle length of 240 ms. Attempts to overdrive pace resulted in irregularity in the emergency atrial fibrillation.  Assessment and  Plan

## 2012-05-15 NOTE — Assessment & Plan Note (Signed)
The patient had a syncopal episode and clunk his head. He has superficial lacerations. We'll undertake CAT scanning to exclude intracranial bleeding

## 2012-05-15 NOTE — Assessment & Plan Note (Signed)
The patient's device was interrogated.  The information was reviewed. No changes were made in the programming.    

## 2012-05-15 NOTE — Assessment & Plan Note (Signed)
euvolemic  Continue Guideline directed medical therapy

## 2012-05-15 NOTE — Patient Instructions (Addendum)
Non-Cardiac CT scanning, (CAT scanning), is a noninvasive, special x-ray that produces cross-sectional images of the body using x-rays and a computer. CT scans help physicians diagnose and treat medical conditions. For some CT exams, a contrast material is used to enhance visibility in the area of the body being studied. CT scans provide greater clarity and reveal more details than regular x-ray exams.  Your physician recommends that you continue on your current medications as directed. Please refer to the Current Medication list given to you today.  Your physician has recommended that you have a Cardioversion (DCCV). Electrical Cardioversion uses a jolt of electricity to your heart either through paddles or wired patches attached to your chest. This is a controlled, usually prescheduled, procedure. Defibrillation is done under light anesthesia in the hospital, and you usually go home the day of the procedure. This is done to get your heart back into a normal rhythm. You are not awake for the procedure. Please see the instruction sheet given to you today.  Your physician recommends that you have lab work today: bmp/cbc

## 2012-05-15 NOTE — Assessment & Plan Note (Signed)
Underwent cardioversion with a significant improvement in performance, both physically and cognitively. This highlights the importance of sinus rhythm. We will pursue repeat cardioversion and anticipate trying to maintain sinus rhythm with antiarrhythmics possibly using dofetilide.  Catheter ablation is also something to consider down the road.  Attempts to overdrive pace atrial flutter to a result in recurrent atrial fibrillation. This may well revert to sinus rhythm on its own; in the event that it does not we'll pursue cardioversion as noted

## 2012-05-15 NOTE — Assessment & Plan Note (Signed)
No intercurrent Ventricular tachycardia  

## 2012-05-15 NOTE — Telephone Encounter (Signed)
I spoke with the patient and made him aware of his DCCV scheduled for 3/24 with Dr. Graciela Husbands at 1:15 pm.

## 2012-05-21 ENCOUNTER — Ambulatory Visit (HOSPITAL_COMMUNITY)
Admission: RE | Admit: 2012-05-21 | Discharge: 2012-05-21 | Disposition: A | Discharge status: Home / Self Care | Payer: BC Managed Care – PPO | Source: Ambulatory Visit | Attending: Internal Medicine | Admitting: Internal Medicine

## 2012-05-21 ENCOUNTER — Encounter (HOSPITAL_COMMUNITY): Payer: Self-pay | Admitting: Gastroenterology

## 2012-05-21 ENCOUNTER — Encounter (HOSPITAL_COMMUNITY): Payer: Self-pay | Admitting: Anesthesiology

## 2012-05-21 ENCOUNTER — Ambulatory Visit (HOSPITAL_COMMUNITY): Payer: BC Managed Care – PPO | Admitting: Anesthesiology

## 2012-05-21 ENCOUNTER — Encounter (HOSPITAL_COMMUNITY)
Admission: RE | Disposition: A | Discharge status: Home / Self Care | Payer: Self-pay | Source: Ambulatory Visit | Attending: Internal Medicine

## 2012-05-21 DIAGNOSIS — Z7901 Long term (current) use of anticoagulants: Secondary | ICD-10-CM | POA: Insufficient documentation

## 2012-05-21 DIAGNOSIS — Z9861 Coronary angioplasty status: Secondary | ICD-10-CM | POA: Insufficient documentation

## 2012-05-21 DIAGNOSIS — I252 Old myocardial infarction: Secondary | ICD-10-CM | POA: Insufficient documentation

## 2012-05-21 DIAGNOSIS — I509 Heart failure, unspecified: Secondary | ICD-10-CM | POA: Insufficient documentation

## 2012-05-21 DIAGNOSIS — E1142 Type 2 diabetes mellitus with diabetic polyneuropathy: Secondary | ICD-10-CM | POA: Insufficient documentation

## 2012-05-21 DIAGNOSIS — I4891 Unspecified atrial fibrillation: Secondary | ICD-10-CM

## 2012-05-21 DIAGNOSIS — I5022 Chronic systolic (congestive) heart failure: Secondary | ICD-10-CM | POA: Insufficient documentation

## 2012-05-21 DIAGNOSIS — E1149 Type 2 diabetes mellitus with other diabetic neurological complication: Secondary | ICD-10-CM | POA: Insufficient documentation

## 2012-05-21 DIAGNOSIS — Z95810 Presence of automatic (implantable) cardiac defibrillator: Secondary | ICD-10-CM | POA: Insufficient documentation

## 2012-05-21 DIAGNOSIS — I2510 Atherosclerotic heart disease of native coronary artery without angina pectoris: Secondary | ICD-10-CM | POA: Insufficient documentation

## 2012-05-21 DIAGNOSIS — Z9181 History of falling: Secondary | ICD-10-CM | POA: Insufficient documentation

## 2012-05-21 DIAGNOSIS — E785 Hyperlipidemia, unspecified: Secondary | ICD-10-CM | POA: Insufficient documentation

## 2012-05-21 DIAGNOSIS — Z79899 Other long term (current) drug therapy: Secondary | ICD-10-CM | POA: Insufficient documentation

## 2012-05-21 DIAGNOSIS — I2589 Other forms of chronic ischemic heart disease: Secondary | ICD-10-CM | POA: Insufficient documentation

## 2012-05-21 HISTORY — PX: CARDIOVERSION: SHX1299

## 2012-05-21 LAB — BASIC METABOLIC PANEL
BUN: 22 mg/dL (ref 6–23)
GFR calc non Af Amer: 55 mL/min — ABNORMAL LOW (ref >90)
Glucose, Bld: 142 mg/dL — ABNORMAL HIGH (ref 70–99)
Potassium: 4.4 mEq/L (ref 3.5–5.1)

## 2012-05-21 SURGERY — CARDIOVERSION
Anesthesia: General

## 2012-05-21 MED ORDER — SODIUM CHLORIDE 0.9 % IV SOLN
INTRAVENOUS | Status: DC | PRN
  Administered 2012-05-21: 14:00:00 via INTRAVENOUS

## 2012-05-21 MED ORDER — PROPOFOL 10 MG/ML IV BOLUS
INTRAVENOUS | Status: DC | PRN
  Administered 2012-05-21: 120 mg via INTRAVENOUS

## 2012-05-21 MED ORDER — SODIUM CHLORIDE 0.9 % IV SOLN
INTRAVENOUS | Status: DC
  Administered 2012-05-21: 500 mL via INTRAVENOUS

## 2012-05-21 NOTE — Anesthesia Preprocedure Evaluation (Addendum)
Anesthesia Evaluation  Patient identified by MRN, date of birth, ID band Patient awake    Reviewed: Allergy & Precautions, H&P , NPO status , Patient's Chart, lab work & pertinent test results  History of Anesthesia Complications Negative for: history of anesthetic complications  Airway Mallampati: II  Neck ROM: Full    Dental  (+) Teeth Intact and Dental Advisory Given   Pulmonary neg pulmonary ROS,    Pulmonary exam normal       Cardiovascular + Past MI + dysrhythmias Atrial Fibrillation + Cardiac Defibrillator Rhythm:Irregular Rate:Normal  3 stents, pacemker, AICD   Neuro/Psych Depression Left facial numbness    GI/Hepatic negative GI ROS, Neg liver ROS,   Endo/Other  diabetes, Well Controlled, Type 2  Renal/GU Renal diseaseKidney stnes     Musculoskeletal   Abdominal   Peds  Hematology   Anesthesia Other Findings   Reproductive/Obstetrics                         Anesthesia Physical Anesthesia Plan  ASA: III  Anesthesia Plan: General   Post-op Pain Management:    Induction: Intravenous  Airway Management Planned:   Additional Equipment:   Intra-op Plan:   Post-operative Plan:   Informed Consent: I have reviewed the patients History and Physical, chart, labs and discussed the procedure including the risks, benefits and alternatives for the proposed anesthesia with the patient or authorized representative who has indicated his/her understanding and acceptance.   Dental advisory given  Plan Discussed with: CRNA and Anesthesiologist  Anesthesia Plan Comments:         Anesthesia Quick Evaluation

## 2012-05-21 NOTE — H&P (View-Only) (Signed)
kf Patient Care Team: Jacques Navy, MD as PCP - General   HPI  [name] is a 67 y.o. maleseen in followup for aborted cardiac arrest. He has ischemic cardiomyopathy with most recent ejection fraction of 35%. He has recently undergone cardioversion following loading for one month with amiodarone. He chose not to undertake dofetilide.  He felt considerably better in sinus rhythm. He had better exercise tolerance and more clarity of thought.  The other night he fell. He has had a history of  Syncope and presyncope accompanied by epiphenomena consistent with neurally mediated syndrome. On this occasion he noted the next morning that there was excoriations and cuts on his head. He also had some problems playing the guitar with the left hand  but he thought that that was related to falling on his hand having excluded the fact that was probably not arthritis  Past Medical History  Diagnosis Date  . Ischemic cardiomyopathy 09/22/2008    DES CX and RCA  70% residual LAD  Done in [place]; EF 30%;; myoviewq 07/15/2006 no ischemia  . Implantable cardiac defibrillator MDT 09/22/2008    Change out feb 2011  . NEPHROLITHIASIS, HX OF 02/25/2008  . CALLUS, RIGHT FOOT 12/08/2009  . Chronic systolic heart failure 11/03/2009  . Atrial fibrillation 11/25/2006    Inapp shock 07-15-2010 AF VR >220 recurrent  . HYPERLIPIDEMIA 11/25/2006  . HYPOGONADISM, MALE 12/05/2006  . 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 DIABETES MELLITUS, TYPE II 12/05/2006  . 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 Long term (current) use of anticoagulants 04/25/2010  . ICD (implantable cardiac defibrillator) in place     medtronic  . Myocardial infarction 07-14-04    "I died and they brought me back"  . Kidney stones     Past Surgical History  Procedure Laterality Date  . Cataract extraction    . Tonsillectomy    . Cardiac defibrillator placement      Guidant Vitality [name]  . Ptca    . Tonsillectomy    . Eye surgery     cataracts  . Cardiac catheterization      feburary 2012/07/14  . Cardioversion N/A 04/25/2012    Procedure: CARDIOVERSION;  Surgeon: Cassell Clement, MD;  Location: [facility] ENDOSCOPY;  Service: Cardiovascular;  Laterality: N/A;    Current Outpatient Prescriptions  Medication Sig Dispense Refill  . amiodarone (PACERONE) 400 MG tablet Take 1 tab twice daily for 2 weeks then reduce to once daily for 2 weeks  90 tablet  1  . ANDROGEL PUMP 20.25 MG/ACT (1.62%) GEL       . b complex vitamins tablet Take 1 tablet by mouth daily.      Marland Kitchen ketorolac (TORADOL) 10 MG tablet Take 10 mg by mouth daily after supper.      Marland Kitchen lisinopril (PRINIVIL,ZESTRIL) 5 MG tablet TAKE ONE TABLET EVERY DAY  90 tablet  1  . loratadine (CLARITIN) 10 MG tablet Take 10 mg by mouth daily.      . metFORMIN (GLUCOPHAGE) 1000 MG tablet TAKE ONE TABLET TWICE DAILY FOR DIABETES  100 tablet  2  . metoprolol (LOPRESSOR) 100 MG tablet Take 1 tablet (100 mg total) by mouth 2 (two) times daily.  90 tablet  1  . metoprolol succinate (TOPROL-XL) 100 MG 24 hr tablet Take 100 mg by mouth daily. Take with or immediately following a meal.      . Multiple Vitamins-Minerals (CENTRUM SILVER ADULT 50+ PO) Take 1 tablet by mouth  daily.      . nitroGLYCERIN (NITROSTAT) 0.4 MG SL tablet Place 0.4 mg under the tongue every 5 (five) minutes as needed. For chest pain      . Tamsulosin HCl (FLOMAX) 0.4 MG CAPS Take 0.4 mg by mouth daily after supper. Taking every day when needed      . vardenafil (LEVITRA) 20 MG tablet Take 20 mg by mouth daily as needed.      . vitamin C (ASCORBIC ACID) 500 MG tablet Take 500 mg by mouth daily.      Marland Kitchen warfarin (COUMADIN) 2.5 MG tablet USE AS DIRECTED BY ANTICOAGULATION      CLINIC  120 tablet  1  . zolpidem (AMBIEN) 10 MG tablet Take 0.5 tablets (5 mg total) by mouth at bedtime as needed for sleep.  30 tablet  5   No current facility-administered medications for this visit.    Allergies  Allergen Reactions  . Salmon (Fish  Allergy) Other (See Comments)    Only canned/ not fresh (probably preservatives).     Review of Systems negative except from HPI and PMH  Physical Exam BP 126/87  Pulse 84  Ht 5\' 11"  (1.803 m)  Wt 187 lb 12.8 oz (85.186 kg)  BMI 26.2 kg/m2 Well developed and well nourished in no acute distress HENT excoriations and laceratioins on his scalp E scleral and icterus clear Neck Supple JVP flat; carotids brisk and full Clear to ausculation  Regular rate and rhythm, no murmurs gallops or rub Soft with active bowel sounds No clubbing cyanosis none Edema Alert and oriented, grossly normal motor and sensory function Skin Warm and Dry  ECG demonstrates ventricular pacing with underlying atrial arrhythmia.  Internal electrograms demonstrated atrial flutter at a cycle length of 240 ms. Attempts to overdrive pace resulted in irregularity in the emergency atrial fibrillation.  Assessment and  Plan

## 2012-05-21 NOTE — Anesthesia Postprocedure Evaluation (Signed)
Anesthesia Post Note  Patient: [name]  Procedure(s) Performed: Procedure(s) (LRB): CARDIOVERSION (N/A)  Anesthesia type: General  Patient location: PACU  Post pain: Pain level controlled  Post assessment: Post-op Vital signs reviewed  Last Vitals: BP 115/68  Pulse 74  Temp(Src) 36.6 C (Oral)  Resp 22  SpO2 96%  Post vital signs: Reviewed  Level of consciousness: sedated  Complications: No apparent anesthesia complications

## 2012-05-21 NOTE — Interval H&P Note (Signed)
History and Physical Interval Note:  05/21/2012 1:43 PM  [name]  has presented today for surgery, with the diagnosis of a-fib  The various methods of treatment have been discussed with the patient and family. After consideration of risks, benefits and other options for treatment, the patient has consented to  Procedure(s): CARDIOVERSION (N/A) as a surgical intervention .  The patient's history has been reviewed, patient examined, no change in status, stable for surgery.  I have reviewed the patient's chart and labs.  Questions were answered to the patient's satisfaction.     Sherryl Manges

## 2012-05-21 NOTE — CV Procedure (Signed)
Preop Dx  afib Post op DX  NSR   Procedure  DC Cardioversion   Pt was sedated by anesthesia receiving   120  mg Propafol  A synchronized shock 120  Joules restored sinus Rhythm   Pt tolerated without difficulty

## 2012-05-21 NOTE — Transfer of Care (Signed)
Immediate Anesthesia Transfer of Care Note  Patient: [name]  Procedure(s) Performed: Procedure(s): CARDIOVERSION (N/A)  Patient Location: PACU  Anesthesia Type:General  Level of Consciousness: awake, alert  and patient cooperative  Airway & Oxygen Therapy: Patient Spontanous Breathing and Patient connected to nasal cannula oxygen  Post-op Assessment: Report given to PACU RN, Post -op Vital signs reviewed and stable and Patient moving all extremities  Post vital signs: Reviewed and stable  Complications: No apparent anesthesia complications

## 2012-05-21 NOTE — Anesthesia Postprocedure Evaluation (Signed)
  Anesthesia Post-op Note  Patient: [name]  Procedure(s) Performed: Procedure(s): CARDIOVERSION (N/A)  Patient Location: Short Stay  Anesthesia Type:General  Level of Consciousness: awake and alert   Airway and Oxygen Therapy: Patient Spontanous Breathing and Patient connected to nasal cannula oxygen  Post-op Pain: none  Post-op Assessment: Post-op Vital signs reviewed, Patient's Cardiovascular Status Stable and Respiratory Function Stable  Post-op Vital Signs: Reviewed and stable  Complications: No apparent anesthesia complications

## 2012-05-22 ENCOUNTER — Encounter (HOSPITAL_COMMUNITY): Payer: Self-pay | Admitting: Internal Medicine

## 2012-06-05 ENCOUNTER — Ambulatory Visit (INDEPENDENT_AMBULATORY_CARE_PROVIDER_SITE_OTHER): Payer: BC Managed Care – PPO | Admitting: *Deleted

## 2012-06-05 DIAGNOSIS — Z7901 Long term (current) use of anticoagulants: Secondary | ICD-10-CM

## 2012-06-05 DIAGNOSIS — I4891 Unspecified atrial fibrillation: Secondary | ICD-10-CM

## 2012-06-05 LAB — POCT INR: INR: 2.6

## 2012-06-28 ENCOUNTER — Other Ambulatory Visit: Payer: Self-pay | Admitting: *Deleted

## 2012-06-28 MED ORDER — LISINOPRIL 5 MG PO TABS: 5.0000 mg | ORAL_TABLET | Freq: Every day | ORAL | Status: DC

## 2012-07-03 ENCOUNTER — Ambulatory Visit (INDEPENDENT_AMBULATORY_CARE_PROVIDER_SITE_OTHER): Payer: BC Managed Care – PPO | Admitting: *Deleted

## 2012-07-03 DIAGNOSIS — Z7901 Long term (current) use of anticoagulants: Secondary | ICD-10-CM

## 2012-07-03 DIAGNOSIS — I4891 Unspecified atrial fibrillation: Secondary | ICD-10-CM

## 2012-07-03 LAB — POCT INR: INR: 3.7

## 2012-07-04 ENCOUNTER — Other Ambulatory Visit: Payer: Self-pay

## 2012-07-04 MED ORDER — VARDENAFIL HCL 20 MG PO TABS: 20.0000 mg | ORAL_TABLET | Freq: Every day | ORAL | Status: DC | PRN

## 2012-07-04 NOTE — Telephone Encounter (Signed)
Okay to refill - May call in or fax

## 2012-07-04 NOTE — Telephone Encounter (Signed)
levitra called to [facility] pharmacy

## 2012-07-04 NOTE — Telephone Encounter (Signed)
Phone call from [facility] pharmacy [phone] requesting a prescription on Levitra for pt. Pharmacy states this has been faxed but I have not seen the fax. Please advise.

## 2012-07-11 ENCOUNTER — Encounter: Payer: Self-pay | Admitting: Internal Medicine

## 2012-07-11 ENCOUNTER — Ambulatory Visit: Payer: BC Managed Care – PPO | Admitting: Internal Medicine

## 2012-07-11 ENCOUNTER — Ambulatory Visit (INDEPENDENT_AMBULATORY_CARE_PROVIDER_SITE_OTHER): Payer: BC Managed Care – PPO | Admitting: Internal Medicine

## 2012-07-11 VITALS — BP 121/85 | HR 76 | Ht 71.0 in | Wt 179.0 lb

## 2012-07-11 DIAGNOSIS — I2589 Other forms of chronic ischemic heart disease: Secondary | ICD-10-CM

## 2012-07-11 DIAGNOSIS — I4891 Unspecified atrial fibrillation: Secondary | ICD-10-CM

## 2012-07-11 DIAGNOSIS — I5022 Chronic systolic (congestive) heart failure: Secondary | ICD-10-CM

## 2012-07-11 DIAGNOSIS — I4901 Ventricular fibrillation: Secondary | ICD-10-CM

## 2012-07-11 LAB — ICD DEVICE OBSERVATION
AL IMPEDENCE ICD: 475 Ohm
ATRIAL PACING ICD: 12.64 pct
BRDY-0002RV: 70 {beats}/min
BRDY-0003RV: 125 {beats}/min
BRDY-0004RV: 125 {beats}/min
FVT: 0
PACEART VT: 0
TZAT-0001ATACH: 2
TZAT-0001ATACH: 3
TZAT-0001FASTVT: 1
TZAT-0001SLOWVT: 1
TZAT-0002FASTVT: NEGATIVE
TZAT-0005SLOWVT: 88 pct
TZAT-0012ATACH: 150 ms
TZAT-0012ATACH: 150 ms
TZAT-0012SLOWVT: 200 ms
TZAT-0013SLOWVT: 3
TZAT-0018ATACH: NEGATIVE
TZAT-0018SLOWVT: NEGATIVE
TZAT-0019ATACH: 6 V
TZAT-0019FASTVT: 8 V
TZAT-0020ATACH: 1.5 ms
TZAT-0020ATACH: 1.5 ms
TZAT-0020ATACH: 1.5 ms
TZON-0003ATACH: 350 ms
TZON-0003FASTVT: 250 ms
TZON-0003SLOWVT: 280 ms
TZON-0003VSLOWVT: 450 ms
TZON-0004SLOWVT: 40
TZON-0004VSLOWVT: 20
TZON-0005SLOWVT: 16
TZST-0001ATACH: 4
TZST-0001ATACH: 6
TZST-0001FASTVT: 2
TZST-0001FASTVT: 3
TZST-0001FASTVT: 4
TZST-0001SLOWVT: 2
TZST-0001SLOWVT: 3
TZST-0001SLOWVT: 5
TZST-0002ATACH: NEGATIVE
TZST-0002FASTVT: NEGATIVE
TZST-0002FASTVT: NEGATIVE
TZST-0002FASTVT: NEGATIVE
TZST-0003SLOWVT: 35 J
TZST-0003SLOWVT: 35 J
VENTRICULAR PACING ICD: 83.86 pct
VF: 0

## 2012-07-11 MED ORDER — FUROSEMIDE 20 MG PO TABS: 20.0000 mg | ORAL_TABLET | Freq: Every day | ORAL | Status: DC

## 2012-07-11 NOTE — Assessment & Plan Note (Signed)
The patient has had recurring persistent atrial fibrillation. He is markedly better in sinus rhythm. Amiodarone is not working. We will refer him to Dr. Johney Frame for consideration of catheter ablation. There is use of cost and Medicare so will hold off on repeating his echo to look at left atrial dimension.  We've also discussed the impact of discontinuation of amiodarone on his warfarin. His last INR was elevated. He is scheduled to be Coumadin clinic next Tuesday. We will not make any adjustments

## 2012-07-11 NOTE — Progress Notes (Signed)
Patient Care Team: Jacques Navy, MD as PCP - General   HPI  [name] is a 67 y.o. male Seen in followup for aborted cardiac arrest, atrial fibrillation and most recently an episode of syncope He has ischemic cardiomyopathy with most recent ejection fraction of 35%. He has recently undergone cardioversion following loading for one month with amiodarone. He chose not to undertake dofetilide.he had reversion of atrial flutter with attempt to pace terminate in the office in March. He Subsequently underwent cardioversion. He felt better for a couple of days and then reverted back to his dyspnea on exertion Last echocardiogram was 08-03-09; at that time left atrial dimension was 40 mm          Past Medical History  Diagnosis Date  . Ischemic cardiomyopathy 09/22/2008    DES CX and RCA  70% residual LAD  Done in [place]; EF 30%;; myoviewq 08-04-2006 no ischemia  . Implantable cardiac defibrillator MDT 09/22/2008    Change out feb 2011  . NEPHROLITHIASIS, HX OF 02/25/2008  . CALLUS, RIGHT FOOT 12/08/2009  . Chronic systolic heart failure 11/03/2009  . Atrial fibrillation 11/25/2006    Inapp shock 08/04/10 AF VR >220 recurrent  . HYPERLIPIDEMIA 11/25/2006  . HYPOGONADISM, MALE 12/05/2006  . 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 DIABETES MELLITUS, TYPE II 12/05/2006  . 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 Long term (current) use of anticoagulants 04/25/2010  . ICD (implantable cardiac defibrillator) in place     medtronic  . Myocardial infarction 2004/08/03    "I died and they brought me back"  . Kidney stones     Past Surgical History  Procedure Laterality Date  . Cataract extraction    . Tonsillectomy    . Cardiac defibrillator placement      Guidant Vitality [name]  . Ptca    . Tonsillectomy    . Eye surgery      cataracts  . Cardiac catheterization      feburary 08/03/2012  . Cardioversion N/A 04/25/2012    Procedure: CARDIOVERSION;  Surgeon: Cassell Clement, MD;  Location: [facility]  ENDOSCOPY;  Service: Cardiovascular;  Laterality: N/A;  . Cardioversion N/A 05/21/2012    Procedure: CARDIOVERSION;  Surgeon: Duke Salvia, MD;  Location: [facility] ENDOSCOPY;  Service: Cardiovascular;  Laterality: N/A;    Current Outpatient Prescriptions  Medication Sig Dispense Refill  . amiodarone (PACERONE) 200 MG tablet Take 200 mg by mouth 2 (two) times daily.      . ANDROGEL PUMP 20.25 MG/ACT (1.62%) GEL       . b complex vitamins tablet Take 1 tablet by mouth daily.      Marland Kitchen ketorolac (TORADOL) 10 MG tablet Take 10 mg by mouth daily after supper.      Marland Kitchen lisinopril (PRINIVIL,ZESTRIL) 5 MG tablet Take 1 tablet (5 mg total) by mouth daily.  90 tablet  1  . loratadine (CLARITIN) 10 MG tablet Take 10 mg by mouth daily.      . metFORMIN (GLUCOPHAGE) 1000 MG tablet TAKE ONE TABLET TWICE DAILY FOR DIABETES  100 tablet  2  . metoprolol (LOPRESSOR) 100 MG tablet Take 1 tablet (100 mg total) by mouth 2 (two) times daily.  90 tablet  1  . metoprolol succinate (TOPROL-XL) 100 MG 24 hr tablet Take 100 mg by mouth daily. Take with or immediately following a meal.      . Multiple Vitamins-Minerals (CENTRUM SILVER ADULT 50+ PO) Take 1 tablet by mouth daily.      Marland Kitchen  nitroGLYCERIN (NITROSTAT) 0.4 MG SL tablet Place 0.4 mg under the tongue every 5 (five) minutes as needed. For chest pain      . Tamsulosin HCl (FLOMAX) 0.4 MG CAPS Take 0.4 mg by mouth daily after supper. Taking every day when needed      . vardenafil (LEVITRA) 20 MG tablet Take 1 tablet (20 mg total) by mouth daily as needed for erectile dysfunction.  10 tablet  0  . vitamin C (ASCORBIC ACID) 500 MG tablet Take 500 mg by mouth daily.      Marland Kitchen warfarin (COUMADIN) 2.5 MG tablet USE AS DIRECTED BY ANTICOAGULATION      CLINIC  120 tablet  1  . zolpidem (AMBIEN) 10 MG tablet Take 0.5 tablets (5 mg total) by mouth at bedtime as needed for sleep.  30 tablet  5   No current facility-administered medications for this visit.    Allergies  Allergen  Reactions  . Salmon (Fish Allergy) Other (See Comments)    Only canned/ not fresh (probably preservatives).     Review of Systems negative except from HPI and PMH  Physical Exam BP 121/85  Pulse 76  Ht 5\' 11"  (1.803 m)  Wt 179 lb (81.194 kg)  BMI 24.98 kg/m2 Well developed and well nourished in no acute distress HENT normal E scleral and icterus clear Neck Supple JVP flat; carotids brisk and full Clear to ausculation  Irregular rate and rhythm, no murmurs gallops or rub Soft with active bowel sounds No clubbing cyanosis none Edema Alert and oriented, grossly normal motor and sensory function Skin Warm and Dry  ECG demonstrates atrial fibrillation with underlying ventricular pacingasrate and on Monday and and in in a meal soon as  Assessment and  Plan

## 2012-07-11 NOTE — Patient Instructions (Addendum)
Your physician recommends that you schedule a follow-up appointment as a new patient with Dr Johney Frame to discuss afib ablation  Your physician has recommended you make the following change in your medication:  1) Stop Amiodarone 2) Start Furosemide 20mg   3) Needs to call back and let us know what dose of Metoprolol he is taking

## 2012-07-11 NOTE — Assessment & Plan Note (Signed)
contineu current meds

## 2012-07-11 NOTE — Assessment & Plan Note (Signed)
He is having some problems with orthopnea. We will begin him on Lasix 20 mg daily. This seems to track with his atrial fibrillation

## 2012-07-12 ENCOUNTER — Telehealth: Payer: Self-pay | Admitting: Internal Medicine

## 2012-07-12 NOTE — Telephone Encounter (Signed)
New problem   Pt calling to verify a medication he's taking

## 2012-07-12 NOTE — Telephone Encounter (Signed)
Pt was seen in Dr. Odessa Fleming clinic yesterday 07/11/12. Pt states Dr. Graciela Husbands asked  pt to call the office today to verify what Metoprolol he was taken;  According to pt he is taking "Metoprolol Tartrate 100 mg twice a day"

## 2012-07-12 NOTE — Telephone Encounter (Signed)
Will forward to MD. Metoprolol dose is correct in the patient's chart.

## 2012-07-13 NOTE — Telephone Encounter (Signed)
Dr Graciela Husbands aware of metoprolol dosage. Pt aware no changes at this time.

## 2012-07-17 ENCOUNTER — Ambulatory Visit (INDEPENDENT_AMBULATORY_CARE_PROVIDER_SITE_OTHER): Payer: BC Managed Care – PPO

## 2012-07-17 DIAGNOSIS — I4891 Unspecified atrial fibrillation: Secondary | ICD-10-CM

## 2012-07-17 DIAGNOSIS — Z7901 Long term (current) use of anticoagulants: Secondary | ICD-10-CM

## 2012-07-17 LAB — POCT INR: INR: 2.8

## 2012-07-24 ENCOUNTER — Other Ambulatory Visit: Payer: Self-pay

## 2012-07-24 MED ORDER — METFORMIN HCL 1000 MG PO TABS: 1000.0000 mg | ORAL_TABLET | Freq: Two times a day (BID) | ORAL | Status: DC

## 2012-07-25 ENCOUNTER — Other Ambulatory Visit: Payer: Self-pay | Admitting: Emergency Medicine

## 2012-07-25 MED ORDER — METOPROLOL SUCCINATE ER 100 MG PO TB24: 100.0000 mg | ORAL_TABLET | Freq: Two times a day (BID) | ORAL | Status: DC

## 2012-07-25 MED ORDER — METFORMIN HCL 1000 MG PO TABS: 1000.0000 mg | ORAL_TABLET | Freq: Two times a day (BID) | ORAL | Status: DC

## 2012-07-25 NOTE — Addendum Note (Signed)
Addended by: Lyanne Co R on: 07/25/2012 08:09 AM   Modules accepted: Orders

## 2012-07-26 ENCOUNTER — Telehealth: Payer: Self-pay | Admitting: Internal Medicine

## 2012-07-26 NOTE — Telephone Encounter (Signed)
New Prob     Would like to varify and clarify the strength on METOPROLOL. Please call,.

## 2012-07-26 NOTE — Telephone Encounter (Signed)
Spoke with nicky, according to the pt he is taking metoprolol tart 100 mg bid.

## 2012-08-09 ENCOUNTER — Encounter: Payer: Self-pay | Admitting: Internal Medicine

## 2012-08-09 ENCOUNTER — Ambulatory Visit (INDEPENDENT_AMBULATORY_CARE_PROVIDER_SITE_OTHER): Payer: BC Managed Care – PPO

## 2012-08-09 ENCOUNTER — Ambulatory Visit (INDEPENDENT_AMBULATORY_CARE_PROVIDER_SITE_OTHER): Payer: BC Managed Care – PPO | Admitting: Internal Medicine

## 2012-08-09 VITALS — BP 112/66 | HR 70 | Ht 71.0 in | Wt 179.0 lb

## 2012-08-09 DIAGNOSIS — I5022 Chronic systolic (congestive) heart failure: Secondary | ICD-10-CM

## 2012-08-09 DIAGNOSIS — I4891 Unspecified atrial fibrillation: Secondary | ICD-10-CM

## 2012-08-09 DIAGNOSIS — Z7901 Long term (current) use of anticoagulants: Secondary | ICD-10-CM

## 2012-08-09 DIAGNOSIS — I2589 Other forms of chronic ischemic heart disease: Secondary | ICD-10-CM

## 2012-08-09 DIAGNOSIS — I4901 Ventricular fibrillation: Secondary | ICD-10-CM

## 2012-08-09 LAB — ICD DEVICE OBSERVATION
AL AMPLITUDE: 3.4 mv
AL IMPEDENCE ICD: 494 Ohm
ATRIAL PACING ICD: 0.67 pct
BAMS-0001: 170 {beats}/min
BATTERY VOLTAGE: 2.9071 V
BRDY-0002RV: 70 {beats}/min
BRDY-0003RV: 125 {beats}/min
BRDY-0004RV: 125 {beats}/min
CHARGE TIME: 8.097 s
DEV-0020ICD: NEGATIVE
FVT: 0
PACEART VT: 0
RV LEAD AMPLITUDE: 20 mv
RV LEAD IMPEDENCE ICD: 418 Ohm
RV LEAD THRESHOLD: 1.25 V
TOT-0001: 3
TOT-0002: 0
TOT-0006: 20110228000000
TZAT-0001ATACH: 1
TZAT-0001ATACH: 2
TZAT-0001ATACH: 3
TZAT-0001FASTVT: 1
TZAT-0001SLOWVT: 1
TZAT-0002ATACH: NEGATIVE
TZAT-0002ATACH: NEGATIVE
TZAT-0002ATACH: NEGATIVE
TZAT-0002FASTVT: NEGATIVE
TZAT-0004SLOWVT: 8
TZAT-0005SLOWVT: 88 pct
TZAT-0011SLOWVT: 10 ms
TZAT-0012ATACH: 150 ms
TZAT-0012ATACH: 150 ms
TZAT-0012ATACH: 150 ms
TZAT-0012FASTVT: 200 ms
TZAT-0012SLOWVT: 200 ms
TZAT-0013SLOWVT: 3
TZAT-0018ATACH: NEGATIVE
TZAT-0018ATACH: NEGATIVE
TZAT-0018ATACH: NEGATIVE
TZAT-0018FASTVT: NEGATIVE
TZAT-0018SLOWVT: NEGATIVE
TZAT-0019ATACH: 6 V
TZAT-0019ATACH: 6 V
TZAT-0019ATACH: 6 V
TZAT-0019FASTVT: 8 V
TZAT-0019SLOWVT: 8 V
TZAT-0020ATACH: 1.5 ms
TZAT-0020ATACH: 1.5 ms
TZAT-0020ATACH: 1.5 ms
TZAT-0020FASTVT: 1.5 ms
TZAT-0020SLOWVT: 1.5 ms
TZON-0003ATACH: 350 ms
TZON-0003FASTVT: 250 ms
TZON-0003SLOWVT: 280 ms
TZON-0003VSLOWVT: 450 ms
TZON-0004SLOWVT: 40
TZON-0004VSLOWVT: 20
TZON-0005SLOWVT: 16
TZON-0010FASTVT: 30 ms
TZON-0010SLOWVT: 30 ms
TZST-0001ATACH: 4
TZST-0001ATACH: 5
TZST-0001ATACH: 6
TZST-0001FASTVT: 2
TZST-0001FASTVT: 3
TZST-0001FASTVT: 4
TZST-0001FASTVT: 5
TZST-0001FASTVT: 6
TZST-0001SLOWVT: 2
TZST-0001SLOWVT: 3
TZST-0001SLOWVT: 4
TZST-0001SLOWVT: 5
TZST-0001SLOWVT: 6
TZST-0002ATACH: NEGATIVE
TZST-0002ATACH: NEGATIVE
TZST-0002ATACH: NEGATIVE
TZST-0002FASTVT: NEGATIVE
TZST-0002FASTVT: NEGATIVE
TZST-0002FASTVT: NEGATIVE
TZST-0002FASTVT: NEGATIVE
TZST-0002FASTVT: NEGATIVE
TZST-0003SLOWVT: 20 J
TZST-0003SLOWVT: 35 J
TZST-0003SLOWVT: 35 J
TZST-0003SLOWVT: 35 J
TZST-0003SLOWVT: 35 J
VENTRICULAR PACING ICD: 88.54 pct
VF: 0

## 2012-08-09 LAB — POCT INR: INR: 2.4

## 2012-08-09 NOTE — Progress Notes (Signed)
Primary Care Physician: Illene Regulus, MD Referring Physician:  Dr Keane Scrape is a 67 y.o. male with a h/o longstanding persistent atrial fibrillation, ischemic CM with significant structural heart disease, and chronic systolic dysfunction who presents for further afib evaluation.  He reports having atrial fibrillation since 2004-07-31.  His afib has become persistent.  He appears to have been in afib all of the time for more than a year.  He has been placed on amiodarone.  He underwent cardioversion 2/14 and 3/14 but did not maintain sinus rhythm.  He does however report that his exercise tolerance significant improved in sinus rhythm. Today, he denies symptoms of palpitations, chest pain, dizziness, presyncope, syncope, or neurologic sequela. The patient is tolerating medications without difficulties and is otherwise without complaint today.  He has not had an echo since 07-31-2009.  He states that he recently declined echo due to costs of the procedure.  Past Medical History  Diagnosis Date  . Ischemic cardiomyopathy 09/22/2008    DES CX and RCA  70% residual LAD  Done in [place]; EF 30%;; myoviewq August 01, 2006 no ischemia  . Implantable cardiac defibrillator MDT 09/22/2008    Change out feb 2011  . NEPHROLITHIASIS, HX OF 02/25/2008  . CALLUS, RIGHT FOOT 12/08/2009  . Chronic systolic heart failure 11/03/2009  . Persistent atrial fibrillation 11/25/2006    Inapp shock 08/01/2010 AF VR >220 recurrent  . HYPERLIPIDEMIA 11/25/2006  . HYPOGONADISM, MALE 12/05/2006  . 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 DIABETES MELLITUS, TYPE II 12/05/2006  . 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 Long term (current) use of anticoagulants 04/25/2010  . ICD (implantable cardiac defibrillator) in place     medtronic  . Myocardial infarction July 31, 2004    "I died and they brought me back"  . Kidney stones    Past Surgical History  Procedure Laterality Date  . Cataract extraction    . Tonsillectomy    . Cardiac  defibrillator placement      Guidant Vitality [name]  . Ptca    . Tonsillectomy    . Eye surgery      cataracts  . Cardiac catheterization      feburary Jul 31, 2012  . Cardioversion N/A 04/25/2012    Procedure: CARDIOVERSION;  Surgeon: Cassell Clement, MD;  Location: [facility] ENDOSCOPY;  Service: Cardiovascular;  Laterality: N/A;  . Cardioversion N/A 05/21/2012    Procedure: CARDIOVERSION;  Surgeon: Duke Salvia, MD;  Location: [facility] ENDOSCOPY;  Service: Cardiovascular;  Laterality: N/A;    Current Outpatient Prescriptions  Medication Sig Dispense Refill  . b complex vitamins tablet Take 1 tablet by mouth daily.      . furosemide (LASIX) 20 MG tablet Take 1 tablet (20 mg total) by mouth daily.  90 tablet  3  . lisinopril (PRINIVIL,ZESTRIL) 5 MG tablet Take 1 tablet (5 mg total) by mouth daily.  90 tablet  1  . loratadine (CLARITIN) 10 MG tablet Take 10 mg by mouth daily.      . metFORMIN (GLUCOPHAGE) 1000 MG tablet Take 1 tablet (1,000 mg total) by mouth 2 (two) times daily with a meal.  100 tablet  2  . metoprolol (LOPRESSOR) 100 MG tablet Take 1 tablet (100 mg total) by mouth 2 (two) times daily.  90 tablet  1  . Multiple Vitamins-Minerals (CENTRUM SILVER ADULT 50+ PO) Take 1 tablet by mouth daily.      . nitroGLYCERIN (NITROSTAT) 0.4 MG SL tablet Place 0.4 mg under  the tongue every 5 (five) minutes as needed. For chest pain      . Tamsulosin HCl (FLOMAX) 0.4 MG CAPS Take 0.4 mg by mouth daily after supper. Taking every day when needed      . vardenafil (LEVITRA) 20 MG tablet Take 1 tablet (20 mg total) by mouth daily as needed for erectile dysfunction.  10 tablet  0  . vitamin C (ASCORBIC ACID) 500 MG tablet Take 500 mg by mouth daily.      Marland Kitchen warfarin (COUMADIN) 2.5 MG tablet USE AS DIRECTED BY ANTICOAGULATION      CLINIC  120 tablet  1  . zolpidem (AMBIEN) 10 MG tablet Take 0.5 tablets (5 mg total) by mouth at bedtime as needed for sleep.  30 tablet  5   No current facility-administered medications  for this visit.    Allergies  Allergen Reactions  . Salmon (Fish Allergy) Other (See Comments)    Only canned/ not fresh (probably preservatives).     History   Social History  . Marital Status: Divorced    Spouse Name: N/A    Number of Children: 1  . Years of Education: 16   Occupational History  . 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 Smoking status: Never Smoker   . Smokeless tobacco: Never Used  . Alcohol Use: Yes     Comment: summer   . Drug Use: No  . Sexually Active: Yes -- Male partner(s)   Other Topics Concern  . Not on file   Social History Narrative   HSG, College grad. Married - divorced. 1 daughter. owner/operator electronics business and show production    Family History  Problem Relation Age of Onset  . Diabetes Maternal Grandmother   . Hyperlipidemia Maternal Grandmother   . Cancer Neg Hx   . COPD Neg Hx   . Heart disease Neg Hx     ROS- All systems are reviewed and negative except as per the HPI above  Physical Exam: Filed Vitals:   08/09/12 0942  BP: 112/66  Pulse: 70  Height: 5\' 11"  (1.803 m)  Weight: 179 lb (81.194 kg)  SpO2: 98%    GEN- The patient is well appearing, alert and oriented x 3 today.   Head- normocephalic, atraumatic Eyes-  Sclera clear, conjunctiva pink Ears- hearing intact Oropharynx- clear Neck- supple,  Lungs- Clear to ausculation bilaterally, normal work of breathing Heart- irregular rate and rhythm,   GI- soft, NT, ND, + BS Extremities- no clubbing, cyanosis, or edema MS- no significant deformity or atrophy Skin- no rash or lesion Psych- euthymic mood, full affect Neuro- strength and sensation are intact  ICD interrogation is reviewed today at length.  Afib burden is high (100%) except for two recent cardioversion attempts which were short lived Echo 2011 reviewed Records including Dr Koren Bound notes are reviewed  Assessment and Plan:  1. Symptomatic  longstanding persistent atrial fibrillation The patient has very refractory atrial fibrillation and has failed medical therapy with amiodarone.  He has significant structural heart disease which has not been evaluated by echo since 2011.  I do not think that we could consider ablative strategies for his afib until we know the present condition of his structural heart disease.  He wishes to wait until he has medicare in August.  I certainly think that this is reasonable.  There is not urgency at this point as he has been in persistent afib for years. I  think that a traditional PVI would carry a low (50-60%) success rate for this patient.  I would favor a convergent procedure for him as I do feel that he would likely do better in sinus rhythm.  Therapeutic strategies for afib including medicine, traditional ablation, and the convergent procedure were discussed in detail with the patient today. Risk, benefits, and alternatives to each were also discussed in detail today.  At this time, he would like to have an echo in August and then visit Dr Smith Robert at [facility] to discuss possibilities of the convergent procedure.  He will followup with Dr Graciela Husbands.  2. Chronic systolic dysfunction Stable I think that he would do better in sinus rhythm longterm.

## 2012-08-09 NOTE — Patient Instructions (Addendum)
Your physician recommends that you schedule a follow-up appointment in: 3 months with Dr Graciela Husbands  Need to get an echo in Aug   To get a referral to [facility] Dr Hurman Horn after echo for ablation

## 2012-08-13 ENCOUNTER — Encounter: Payer: Self-pay | Admitting: Internal Medicine

## 2012-08-13 ENCOUNTER — Ambulatory Visit (INDEPENDENT_AMBULATORY_CARE_PROVIDER_SITE_OTHER): Payer: BC Managed Care – PPO | Admitting: Internal Medicine

## 2012-08-13 VITALS — BP 110/78 | HR 80 | Temp 97.0°F | Resp 8 | Ht 71.0 in | Wt 174.0 lb

## 2012-08-13 DIAGNOSIS — J209 Acute bronchitis, unspecified: Secondary | ICD-10-CM

## 2012-08-13 MED ORDER — SULFAMETHOXAZOLE-TRIMETHOPRIM 800-160 MG PO TABS: 1.0000 | ORAL_TABLET | Freq: Two times a day (BID) | ORAL | Status: DC

## 2012-08-13 MED ORDER — PROMETHAZINE-CODEINE 6.25-10 MG/5ML PO SYRP: 5.0000 mL | ORAL_SOLUTION | ORAL | Status: DC | PRN

## 2012-08-13 NOTE — Patient Instructions (Addendum)
Acute bronchitis  Plan Septra DS bid x 7 days  Mucinex 1200 mg twice a day  Phenergan with codeine cough syrup 1 tsp every 6 hours as needed.  Hydrate  Tylenol for fever and aches: 500-1,000 mg three times a day.  Acute Bronchitis You have acute bronchitis. This means you have a chest cold. The airways in your lungs are red and sore (inflamed). Acute means it is sudden onset.  CAUSES Bronchitis is most often caused by the same virus that causes a cold. SYMPTOMS   Body aches.  Chest congestion.  Chills.  Cough.  Fever.  Shortness of breath.  Sore throat. TREATMENT  Acute bronchitis is usually treated with rest, fluids, and medicines for relief of fever or cough. Most symptoms should go away after a few days or a week. Increased fluids may help thin your secretions and will prevent dehydration. Your caregiver may give you an inhaler to improve your symptoms. The inhaler reduces shortness of breath and helps control cough. You can take over-the-counter pain relievers or cough medicine to decrease coughing, pain, or fever. A cool-air vaporizer may help thin bronchial secretions and make it easier to clear your chest. Antibiotics are usually not needed but can be prescribed if you smoke, are seriously ill, have chronic lung problems, are elderly, or you are at higher risk for developing complications.Allergies and asthma can make bronchitis worse. Repeated episodes of bronchitis may cause longstanding lung problems. Avoid smoking and secondhand smoke.Exposure to cigarette smoke or irritating chemicals will make bronchitis worse. If you are a cigarette smoker, consider using nicotine gum or skin patches to help control withdrawal symptoms. Quitting smoking will help your lungs heal faster. Recovery from bronchitis is often slow, but you should start feeling better after 2 to 3 days. Cough from bronchitis frequently lasts for 3 to 4 weeks. To prevent another bout of acute  bronchitis:  Quit smoking.  Wash your hands frequently to get rid of viruses or use a hand sanitizer.  Avoid other people with cold or virus symptoms.  Try not to touch your hands to your mouth, nose, or eyes. SEEK IMMEDIATE MEDICAL CARE IF:  You develop increased fever, chills, or chest pain.  You have severe shortness of breath or bloody sputum.  You develop dehydration, fainting, repeated vomiting, or a severe headache.  You have no improvement after 1 week of treatment or you get worse. MAKE SURE YOU:   Understand these instructions.  Will watch your condition.  Will get help right away if you are not doing well or get worse. Document Released: 03/24/2004 Document Revised: 05/09/2011 Document Reviewed: 06/09/2010 [facility] Patient Information 2014 [place], [place].

## 2012-08-13 NOTE — Progress Notes (Signed)
  Subjective:    Patient ID: [name], male    DOB: 1946/02/26, 67 y.o.   MRN: [id number]  HPI [name] presents with a week long h/o cough that is productive thick, colored and pink tinged. He initially had some night sweats and question of fever. He admits to SOB. He was also a bit shaky.   PMH, FamHx and SocHx reviewed for any changes and relevance. Current Outpatient Prescriptions on File Prior to Visit  Medication Sig Dispense Refill  . b complex vitamins tablet Take 1 tablet by mouth daily.      . furosemide (LASIX) 20 MG tablet Take 1 tablet (20 mg total) by mouth daily.  90 tablet  3  . lisinopril (PRINIVIL,ZESTRIL) 5 MG tablet Take 1 tablet (5 mg total) by mouth daily.  90 tablet  1  . loratadine (CLARITIN) 10 MG tablet Take 10 mg by mouth daily.      . metFORMIN (GLUCOPHAGE) 1000 MG tablet Take 1 tablet (1,000 mg total) by mouth 2 (two) times daily with a meal.  100 tablet  2  . metoprolol (LOPRESSOR) 100 MG tablet Take 1 tablet (100 mg total) by mouth 2 (two) times daily.  90 tablet  1  . Multiple Vitamins-Minerals (CENTRUM SILVER ADULT 50+ PO) Take 1 tablet by mouth daily.      . nitroGLYCERIN (NITROSTAT) 0.4 MG SL tablet Place 0.4 mg under the tongue every 5 (five) minutes as needed. For chest pain      . Tamsulosin HCl (FLOMAX) 0.4 MG CAPS Take 0.4 mg by mouth daily after supper. Taking every day when needed      . vardenafil (LEVITRA) 20 MG tablet Take 1 tablet (20 mg total) by mouth daily as needed for erectile dysfunction.  10 tablet  0  . vitamin C (ASCORBIC ACID) 500 MG tablet Take 500 mg by mouth daily.      Marland Kitchen warfarin (COUMADIN) 2.5 MG tablet USE AS DIRECTED BY ANTICOAGULATION      CLINIC  120 tablet  1  . zolpidem (AMBIEN) 10 MG tablet Take 0.5 tablets (5 mg total) by mouth at bedtime as needed for sleep.  30 tablet  5   No current facility-administered medications on file prior to visit.      Review of Systems System review is negative for any constitutional,  cardiac, pulmonary, GI or neuro symptoms or complaints other than as described in the HPI.     Objective:   Physical Exam Filed Vitals:   08/13/12 1622  BP: 110/78  Pulse: 80  Temp: 97 F (36.1 C)  Resp: 8   gen'l - WNWD white man in no acute distress HEENT - C&S clear Cor - RRR Pulm - good  Breath sounds, no wheezing no rales.       Assessment & Plan:  Acute bronchitis  Plan Septra DS bid x 7 days  Mucinex 1200 mg twice a day  Phenergan with codeine cough syrup 1 tsp every 6 hours as needed.  Hydrate  Tylenol for fever and aches: 500-1,000 mg three times a day.

## 2012-08-21 ENCOUNTER — Ambulatory Visit (INDEPENDENT_AMBULATORY_CARE_PROVIDER_SITE_OTHER): Payer: BC Managed Care – PPO | Admitting: *Deleted

## 2012-08-21 DIAGNOSIS — I4891 Unspecified atrial fibrillation: Secondary | ICD-10-CM

## 2012-08-21 DIAGNOSIS — Z7901 Long term (current) use of anticoagulants: Secondary | ICD-10-CM

## 2012-09-04 ENCOUNTER — Ambulatory Visit (INDEPENDENT_AMBULATORY_CARE_PROVIDER_SITE_OTHER): Payer: BC Managed Care – PPO | Admitting: *Deleted

## 2012-09-04 DIAGNOSIS — Z7901 Long term (current) use of anticoagulants: Secondary | ICD-10-CM

## 2012-09-04 DIAGNOSIS — I4891 Unspecified atrial fibrillation: Secondary | ICD-10-CM

## 2012-09-11 ENCOUNTER — Other Ambulatory Visit: Payer: Self-pay | Admitting: Internal Medicine

## 2012-09-12 NOTE — Telephone Encounter (Signed)
Promethazine called to pharmacy.

## 2012-09-14 ENCOUNTER — Other Ambulatory Visit: Payer: Self-pay | Admitting: Internal Medicine

## 2012-09-14 MED ORDER — ZOLPIDEM TARTRATE 5 MG PO TABS: 5.0000 mg | ORAL_TABLET | Freq: Every evening | ORAL | Status: DC | PRN

## 2012-10-01 ENCOUNTER — Other Ambulatory Visit: Payer: Self-pay | Admitting: *Deleted

## 2012-10-01 MED ORDER — LISINOPRIL 5 MG PO TABS: 5.0000 mg | ORAL_TABLET | Freq: Every day | ORAL | Status: DC

## 2012-10-02 ENCOUNTER — Ambulatory Visit (INDEPENDENT_AMBULATORY_CARE_PROVIDER_SITE_OTHER): Payer: Medicare Other | Admitting: *Deleted

## 2012-10-02 DIAGNOSIS — Z7901 Long term (current) use of anticoagulants: Secondary | ICD-10-CM

## 2012-10-02 DIAGNOSIS — I4891 Unspecified atrial fibrillation: Secondary | ICD-10-CM

## 2012-10-02 LAB — POCT INR: INR: 2.2

## 2012-10-18 ENCOUNTER — Ambulatory Visit (HOSPITAL_COMMUNITY): Payer: Medicare Other | Attending: Cardiology | Admitting: Radiology

## 2012-10-18 DIAGNOSIS — I4891 Unspecified atrial fibrillation: Secondary | ICD-10-CM | POA: Insufficient documentation

## 2012-10-18 NOTE — Progress Notes (Signed)
Echocardiogram performed.  

## 2012-10-30 ENCOUNTER — Ambulatory Visit (INDEPENDENT_AMBULATORY_CARE_PROVIDER_SITE_OTHER): Payer: Medicare Other | Admitting: *Deleted

## 2012-10-30 DIAGNOSIS — I4891 Unspecified atrial fibrillation: Secondary | ICD-10-CM

## 2012-10-30 DIAGNOSIS — Z7901 Long term (current) use of anticoagulants: Secondary | ICD-10-CM

## 2012-11-20 ENCOUNTER — Encounter: Payer: Self-pay | Admitting: Internal Medicine

## 2012-11-20 ENCOUNTER — Ambulatory Visit (INDEPENDENT_AMBULATORY_CARE_PROVIDER_SITE_OTHER): Payer: Medicare Other | Admitting: Internal Medicine

## 2012-11-20 VITALS — BP 105/72 | HR 79 | Ht 71.0 in | Wt 183.6 lb

## 2012-11-20 DIAGNOSIS — I5022 Chronic systolic (congestive) heart failure: Secondary | ICD-10-CM

## 2012-11-20 DIAGNOSIS — Z95810 Presence of automatic (implantable) cardiac defibrillator: Secondary | ICD-10-CM

## 2012-11-20 DIAGNOSIS — I4901 Ventricular fibrillation: Secondary | ICD-10-CM

## 2012-11-20 DIAGNOSIS — I4891 Unspecified atrial fibrillation: Secondary | ICD-10-CM

## 2012-11-20 LAB — ICD DEVICE OBSERVATION
ATRIAL PACING ICD: 0.23 pct
BATTERY VOLTAGE: 2.7912 V
BRDY-0003RV: 125 {beats}/min
BRDY-0004RV: 125 {beats}/min
CHARGE TIME: 10.61 s
DEV-0020ICD: NEGATIVE
PACEART VT: 1
RV LEAD THRESHOLD: 1 V
TOT-0002: 0
TZAT-0001ATACH: 2
TZAT-0001FASTVT: 1
TZAT-0002ATACH: NEGATIVE
TZAT-0002ATACH: NEGATIVE
TZAT-0012ATACH: 150 ms
TZAT-0012FASTVT: 200 ms
TZAT-0012SLOWVT: 200 ms
TZAT-0013SLOWVT: 3
TZAT-0018ATACH: NEGATIVE
TZAT-0019ATACH: 6 V
TZAT-0019ATACH: 6 V
TZAT-0019ATACH: 6 V
TZAT-0019SLOWVT: 8 V
TZAT-0020ATACH: 1.5 ms
TZAT-0020ATACH: 1.5 ms
TZAT-0020SLOWVT: 1.5 ms
TZON-0003FASTVT: 250 ms
TZON-0003SLOWVT: 280 ms
TZON-0004SLOWVT: 40
TZON-0004VSLOWVT: 20
TZON-0005SLOWVT: 16
TZON-0010FASTVT: 30 ms
TZON-0010SLOWVT: 30 ms
TZST-0001FASTVT: 2
TZST-0001FASTVT: 4
TZST-0001SLOWVT: 4
TZST-0002ATACH: NEGATIVE
TZST-0002FASTVT: NEGATIVE
TZST-0002FASTVT: NEGATIVE
TZST-0003SLOWVT: 35 J
TZST-0003SLOWVT: 35 J
VENTRICULAR PACING ICD: 83.41 pct

## 2012-11-20 MED ORDER — DIGOXIN 250 MCG PO TABS: 0.2500 mg | ORAL_TABLET | Freq: Every day | ORAL | Status: DC

## 2012-11-20 NOTE — Assessment & Plan Note (Signed)
Bulimic. We'll continue medications as above. It would be my preference to change into the long-acting metoprolol will wait until the end of his procedures to make medication adjustments

## 2012-11-20 NOTE — Assessment & Plan Note (Signed)
The patient's device was interrogated and the information was fully reviewed.  The device was reprogrammed to  DDIR mode to prevent any tracking

## 2012-11-20 NOTE — Patient Instructions (Addendum)
Your physician has recommended you make the following change in your medication: \ 1) START DIGOXIN 0.25 MG DAILY  Your physician recommends that you return for lab work in: 2 WEEKS for Digoxin level & BMET  Your physician recommends that you schedule a follow-up appointment in: early December with Dr. Graciela Husbands

## 2012-11-20 NOTE — Assessment & Plan Note (Signed)
In no recurrent ventricular arrhythmias

## 2012-11-20 NOTE — Progress Notes (Signed)
Patient Care Team: Jacques Navy, MD as PCP - General   HPI  [name] is a 67 y.o. male   Seen in followup for aborted cardiac arrest, atrial fibrillation and most recently an episode of syncope  He has ischemic cardiomyopathy with most recent ejection fraction of 35%. He has recently undergone cardioversion following loading for one month with amiodarone. He chose not to undertake dofetilide.he had reversion of atrial flutter with attempt to pace terminate in the office in March. He Subsequently underwent cardioversion. He felt better for a couple of days and then reverted back to his dyspnea on exertion  Last echocardiogram was Aug 01, 2009; at that time left atrial dimension was 40 mm   He saw Dr. Johney Frame who recommended consideration for convergent ablation at [facility] ; he was seen by Dr. Hurman Horn and the plan is to proceed in mid October  He has ischemic heart disease with prior stenting    Echo >> EF 20% Mod LAE  Past Medical History  Diagnosis Date  . Ischemic cardiomyopathy 09/22/2008    DES CX and RCA  70% residual LAD  Done in [place]; EF 30%;; myoviewq 08-02-2006 no ischemia  . Implantable cardiac defibrillator MDT 09/22/2008    Change out feb 2011  . NEPHROLITHIASIS, HX OF 02/25/2008  . CALLUS, RIGHT FOOT 12/08/2009  . Chronic systolic heart failure 11/03/2009  . Persistent atrial fibrillation 11/25/2006    Inapp shock 08/02/10 AF VR >220 recurrent  . HYPERLIPIDEMIA 11/25/2006  . HYPOGONADISM, MALE 12/05/2006  . 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 DIABETES MELLITUS, TYPE II 12/05/2006  . 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 Long term (current) use of anticoagulants 04/25/2010  . ICD (implantable cardiac defibrillator) in place     medtronic  . Myocardial infarction 2004/08/01    "I died and they brought me back"  . Kidney stones     Past Surgical History  Procedure Laterality Date  . Cataract extraction    . Tonsillectomy    . Cardiac defibrillator placement     Guidant Vitality [name]  . Ptca    . Tonsillectomy    . Eye surgery      cataracts  . Cardiac catheterization      feburary 08/01/2012  . Cardioversion N/A 04/25/2012    Procedure: CARDIOVERSION;  Surgeon: Cassell Clement, MD;  Location: [facility] ENDOSCOPY;  Service: Cardiovascular;  Laterality: N/A;  . Cardioversion N/A 05/21/2012    Procedure: CARDIOVERSION;  Surgeon: Duke Salvia, MD;  Location: [facility] ENDOSCOPY;  Service: Cardiovascular;  Laterality: N/A;    Current Outpatient Prescriptions  Medication Sig Dispense Refill  . b complex vitamins tablet Take 1 tablet by mouth daily.      . furosemide (LASIX) 20 MG tablet Take 1 tablet (20 mg total) by mouth daily.  90 tablet  3  . lisinopril (PRINIVIL,ZESTRIL) 5 MG tablet Take 1 tablet (5 mg total) by mouth daily.  90 tablet  1  . loratadine (CLARITIN) 10 MG tablet Take 10 mg by mouth daily.      . metFORMIN (GLUCOPHAGE) 1000 MG tablet Take 1 tablet (1,000 mg total) by mouth 2 (two) times daily with a meal.  100 tablet  2  . metoprolol (LOPRESSOR) 100 MG tablet Take 1 tablet (100 mg total) by mouth 2 (two) times daily.  90 tablet  1  . Multiple Vitamins-Minerals (CENTRUM SILVER ADULT 50+ PO) Take 1 tablet by mouth daily.      . nitroGLYCERIN (NITROSTAT) 0.4  MG SL tablet Place 0.4 mg under the tongue every 5 (five) minutes as needed. For chest pain      . PROMETHAZINE VC/CODEINE 6.25-5-10 MG/5ML SYRP TAKE ONE TEASPOON ( ) EVERY 4 HOURS AS NEEDED FOR COUGH.  120 mL  1  . promethazine-codeine (PHENERGAN WITH CODEINE) 6.25-10 MG/5ML syrup Take 5 mLs by mouth every 4 (four) hours as needed for cough.  120 mL  0  . sulfamethoxazole-trimethoprim (BACTRIM DS,SEPTRA DS) 800-160 MG per tablet Take 1 tablet by mouth 2 (two) times daily.  20 tablet  0  . Tamsulosin HCl (FLOMAX) 0.4 MG CAPS Take 0.4 mg by mouth daily after supper. Taking every day when needed      . vardenafil (LEVITRA) 20 MG tablet Take 1 tablet (20 mg total) by mouth daily as needed for erectile  dysfunction.  10 tablet  0  . vitamin C (ASCORBIC ACID) 500 MG tablet Take 500 mg by mouth daily.      Marland Kitchen warfarin (COUMADIN) 2.5 MG tablet USE AS DIRECTED BY ANTICOAGULATION      CLINIC  120 tablet  1  . zolpidem (AMBIEN) 5 MG tablet Take 1 tablet (5 mg total) by mouth at bedtime as needed for sleep.  30 tablet  5   No current facility-administered medications for this visit.    Allergies  Allergen Reactions  . Salmon [Fish Allergy] Other (See Comments)    Only canned/ not fresh (probably preservatives).     Review of Systems negative except from HPI and PMH  Physical Exam BP 105/72  Pulse 79  Ht 5\' 11"  (1.803 m)  Wt 183 lb 9.6 oz (83.28 kg)  BMI 25.62 kg/m2 Well developed and well nourished in no acute distress HENT normal E scleral and icterus clear Neck Supple JVP flat; carotids brisk and full Clear to ausculation  Regular rate and rhythm, 2/6 systolic murmur Soft with active bowel sounds No clubbing cyanosis none Edema Alert and oriented, grossly normal motor and sensory function Skin Warm and Dry    Assessment and  Plan

## 2012-11-20 NOTE — Assessment & Plan Note (Signed)
Permanent atrial fibrillation with a somewhatexcessively rapid ventricular response particularly on occasion regularization of his atrial rhythm. Given his left ventricular dysfunction begin him on low-dose digoxin. We'll monitor his digoxin and potassium level in 2 weeks time.

## 2012-12-03 ENCOUNTER — Other Ambulatory Visit: Payer: Self-pay | Admitting: Internal Medicine

## 2012-12-04 ENCOUNTER — Encounter: Payer: Self-pay | Admitting: Internal Medicine

## 2012-12-07 ENCOUNTER — Other Ambulatory Visit: Payer: Medicare Other

## 2012-12-07 ENCOUNTER — Ambulatory Visit (INDEPENDENT_AMBULATORY_CARE_PROVIDER_SITE_OTHER): Payer: Medicare Other | Admitting: *Deleted

## 2012-12-07 DIAGNOSIS — I4891 Unspecified atrial fibrillation: Secondary | ICD-10-CM

## 2012-12-07 DIAGNOSIS — Z7901 Long term (current) use of anticoagulants: Secondary | ICD-10-CM

## 2012-12-07 LAB — POCT INR: INR: 2.5

## 2012-12-10 ENCOUNTER — Other Ambulatory Visit: Payer: Self-pay | Admitting: Internal Medicine

## 2012-12-11 ENCOUNTER — Other Ambulatory Visit: Payer: Medicare Other

## 2012-12-18 DIAGNOSIS — I10 Essential (primary) hypertension: Secondary | ICD-10-CM | POA: Insufficient documentation

## 2012-12-18 DIAGNOSIS — I255 Ischemic cardiomyopathy: Secondary | ICD-10-CM | POA: Insufficient documentation

## 2012-12-20 DIAGNOSIS — Z9889 Other specified postprocedural states: Secondary | ICD-10-CM | POA: Insufficient documentation

## 2013-01-01 ENCOUNTER — Ambulatory Visit (INDEPENDENT_AMBULATORY_CARE_PROVIDER_SITE_OTHER): Payer: Medicare Other | Admitting: General Practice

## 2013-01-01 DIAGNOSIS — I4891 Unspecified atrial fibrillation: Secondary | ICD-10-CM

## 2013-01-01 DIAGNOSIS — Z7901 Long term (current) use of anticoagulants: Secondary | ICD-10-CM

## 2013-01-01 LAB — POCT INR: INR: 1.3

## 2013-01-03 ENCOUNTER — Other Ambulatory Visit: Payer: Self-pay

## 2013-01-17 ENCOUNTER — Other Ambulatory Visit: Payer: Self-pay

## 2013-01-17 MED ORDER — METOPROLOL TARTRATE 50 MG PO TABS: 50.0000 mg | ORAL_TABLET | Freq: Two times a day (BID) | ORAL | Status: DC

## 2013-01-22 ENCOUNTER — Other Ambulatory Visit: Payer: Self-pay | Admitting: Internal Medicine

## 2013-01-29 ENCOUNTER — Encounter: Payer: Self-pay | Admitting: Internal Medicine

## 2013-01-29 ENCOUNTER — Ambulatory Visit (INDEPENDENT_AMBULATORY_CARE_PROVIDER_SITE_OTHER): Payer: Medicare Other | Admitting: Internal Medicine

## 2013-01-29 VITALS — BP 100/65 | HR 70 | Ht 71.0 in | Wt 181.8 lb

## 2013-01-29 DIAGNOSIS — I4891 Unspecified atrial fibrillation: Secondary | ICD-10-CM

## 2013-01-29 DIAGNOSIS — I5022 Chronic systolic (congestive) heart failure: Secondary | ICD-10-CM

## 2013-01-29 DIAGNOSIS — I2589 Other forms of chronic ischemic heart disease: Secondary | ICD-10-CM

## 2013-01-29 DIAGNOSIS — I4901 Ventricular fibrillation: Secondary | ICD-10-CM

## 2013-01-29 DIAGNOSIS — Z95810 Presence of automatic (implantable) cardiac defibrillator: Secondary | ICD-10-CM

## 2013-01-29 LAB — MDC_IDC_ENUM_SESS_TYPE_INCLINIC
Battery Voltage: 2.76 V
Brady Statistic AP VP Percent: 2.05 %
Brady Statistic AP VS Percent: 18.06 %
Brady Statistic AS VP Percent: 0.37 %
Brady Statistic RA Percent Paced: 20.11 %
Brady Statistic RV Percent Paced: 2.42 %
Date Time Interrogation Session: 20141202132843
HighPow Impedance: 57 Ohm
Lead Channel Impedance Value: 418 Ohm
Lead Channel Pacing Threshold Amplitude: 0.75 V
Lead Channel Pacing Threshold Pulse Width: 0.4 ms
Lead Channel Sensing Intrinsic Amplitude: 25.375 mV
Lead Channel Setting Pacing Amplitude: 2.5 V
Lead Channel Setting Pacing Pulse Width: 0.4 ms
Zone Setting Detection Interval: 240 ms
Zone Setting Detection Interval: 250 ms
Zone Setting Detection Interval: 350 ms

## 2013-01-29 NOTE — Assessment & Plan Note (Signed)
Euvolemic. [name] current meds  optivol indexes of blood his slope is increasing. I will not undertake a treatment and would anticipate that when he gets his device checked in 2 weeks with Dr. Hurman Horn that the optivol will have fallen

## 2013-01-29 NOTE — Patient Instructions (Addendum)
Your physician recommends that you continue on your current medications as directed. Please refer to the Current Medication list given to you today.  Remote monitoring is used to monitor your Pacemaker of ICD from home. This monitoring reduces the number of office visits required to check your device to one time per year. It allows [place] to keep an eye on the functioning of your device to ensure it is working properly. You are scheduled for a device check from home on 05/01/2013. You may send your transmission at any time that day. If you have a wireless device, the transmission will be sent automatically. After your physician reviews your transmission, you will receive a postcard with your next transmission date.  Your physician wants you to follow-up in: 6 months with Dr. Graciela Husbands.  You will receive a reminder letter in the mail two months in advance. If you don't receive a letter, please call our office to schedule the follow-up appointment.

## 2013-01-29 NOTE — Assessment & Plan Note (Signed)
Continue current medications. We'll need to review the issue of statin therapy

## 2013-01-29 NOTE — Progress Notes (Signed)
Patient Care Team: Jacques Navy, MD as PCP - General   HPI  [name] is a 67 y.o. male Seen in followup for aborted cardiac arrest, atrial fibrillation and most recently an episode of syncope  He has ischemic cardiomyopathy with most recent ejection fraction of 35%. He has recently undergone cardioversion following loading for one month with amiodarone. He chose not to undertake dofetilide.he had reversion of atrial flutter with attempt to pace terminate in the office in March. He Subsequently underwent cardioversion. He felt better for a couple of days and then reverted back to his dyspnea on exertion  Last echocardiogram was 07/24/09; at that time left atrial dimension was 40 mm  He saw Dr. Johney Frame who recommended consideration for convergent ablation at [facility] ;  Associated with undertaking October 2000 team complicated by postop renal failure. As noted on dofetilide. He was discharged He is feeling much better and better every day. He's had no edema. He does have some peeling on his feet. He has had some vague chest pains. He is very grateful for a much better he feels.   He has ischemic heart disease with prior stenting  Hospital records reviewed   Past Medical History  Diagnosis Date  . Ischemic cardiomyopathy 09/22/2008    DES CX and RCA  70% residual LAD  Done in [place]; EF 30%;; myoviewq 07-25-2006 no ischemia  . Implantable cardiac defibrillator MDT 09/22/2008    Change out feb 2011  . NEPHROLITHIASIS, HX OF 02/25/2008  . CALLUS, RIGHT FOOT 12/08/2009  . Chronic systolic heart failure 11/03/2009  . Persistent atrial fibrillation 11/25/2006    Inapp shock 07-25-10 AF VR >220 recurrent  . HYPERLIPIDEMIA 11/25/2006  . HYPOGONADISM, MALE 12/05/2006  . 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 DIABETES MELLITUS, TYPE II 12/05/2006  . 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 Long term (current) use of anticoagulants 04/25/2010  . ICD (implantable cardiac defibrillator) in  place     medtronic  . Myocardial infarction 24-Jul-2004    "I died and they brought me back"  . Kidney stones     Past Surgical History  Procedure Laterality Date  . Cataract extraction    . Tonsillectomy    . Cardiac defibrillator placement      Guidant Vitality [name]  . Ptca    . Tonsillectomy    . Eye surgery      cataracts  . Cardiac catheterization      feburary 24-Jul-2012  . Cardioversion N/A 04/25/2012    Procedure: CARDIOVERSION;  Surgeon: Cassell Clement, MD;  Location: [facility] ENDOSCOPY;  Service: Cardiovascular;  Laterality: N/A;  . Cardioversion N/A 05/21/2012    Procedure: CARDIOVERSION;  Surgeon: Duke Salvia, MD;  Location: [facility] ENDOSCOPY;  Service: Cardiovascular;  Laterality: N/A;    Current Outpatient Prescriptions  Medication Sig Dispense Refill  . colchicine (COLCRYS) 0.6 MG tablet Take 0.6 mg by mouth 2 (two) times daily.      Marland Kitchen dofetilide (TIKOSYN) 500 MCG capsule Take 500 mcg by mouth 2 (two) times daily.      . furosemide (LASIX) 20 MG tablet Take 1 tablet (20 mg total) by mouth daily.  90 tablet  3  . loratadine (CLARITIN) 10 MG tablet Take 10 mg by mouth as needed.       . Magnesium Oxide (MAG-OX 400 PO) Take 1 tablet by mouth 2 (two) times daily.      . metFORMIN (GLUCOPHAGE) 1000 MG tablet TAKE  ONE TABLET BY MOUTH TWICE DAILY WITH MEALS  100 tablet  5  . metoprolol (LOPRESSOR) 50 MG tablet Take 1 tablet (50 mg total) by mouth 2 (two) times daily.  60 tablet  5  . nitroGLYCERIN (NITROSTAT) 0.4 MG SL tablet Place 0.4 mg under the tongue every 5 (five) minutes as needed. For chest pain      . pantoprazole (PROTONIX) 40 MG tablet Take 40 mg by mouth daily.      . Rivaroxaban (XARELTO) 20 MG TABS tablet Take 20 mg by mouth daily with supper.      . Tamsulosin HCl (FLOMAX) 0.4 MG CAPS Take 0.4 mg by mouth daily as needed.       . zolpidem (AMBIEN) 5 MG tablet Take 5 mg by mouth at bedtime as needed for sleep.        No current facility-administered medications for this  visit.    Allergies  Allergen Reactions  . Salmon [Fish Allergy] Other (See Comments)    Only canned/ not fresh (probably preservatives).     Review of Systems negative except from HPI and PMH  Physical Exam BP 100/65  Pulse 70  Ht 5\' 11"  (1.803 m)  Wt 181 lb 12.8 oz (82.464 kg)  BMI 25.37 kg/m2 Well developed and nourished in no acute distress HENT normal Neck supple with JVP-flat Clear Regular rate and rhythm, no murmurs or gallops Abd-soft with active BS No Clubbing cyanosis edema Skin-warm and dry A & Oriented  Grossly normal sensory and motor function\    Assessment and  Plan

## 2013-01-29 NOTE — Assessment & Plan Note (Signed)
The patient's device was interrogated.  The information was reviewed. No changes were made in the programming.    

## 2013-01-29 NOTE — Assessment & Plan Note (Signed)
Patient has had a frequent recurrences of what is now largely atrial flutter post ablation. There is a significant decrease in atrial arrhythmia burden. We'll continue him on his dofetilide and Rivaroxaban . He is to see Dr. Hurman Horn at [facility] 2 weeks I will defer testing to him

## 2013-01-29 NOTE — Assessment & Plan Note (Signed)
No intercurrent Ventricular tachycardai

## 2013-02-15 ENCOUNTER — Encounter: Payer: Self-pay | Admitting: Internal Medicine

## 2013-02-15 IMAGING — CR DG CHEST 2V
2 series · 2 of 2 positions shown · non-contrast
Comparison: 04/10/2011.

CLINICAL DATA: History of congestion.

CHEST - 2 VIEW

[view not recorded (1 of 2)]
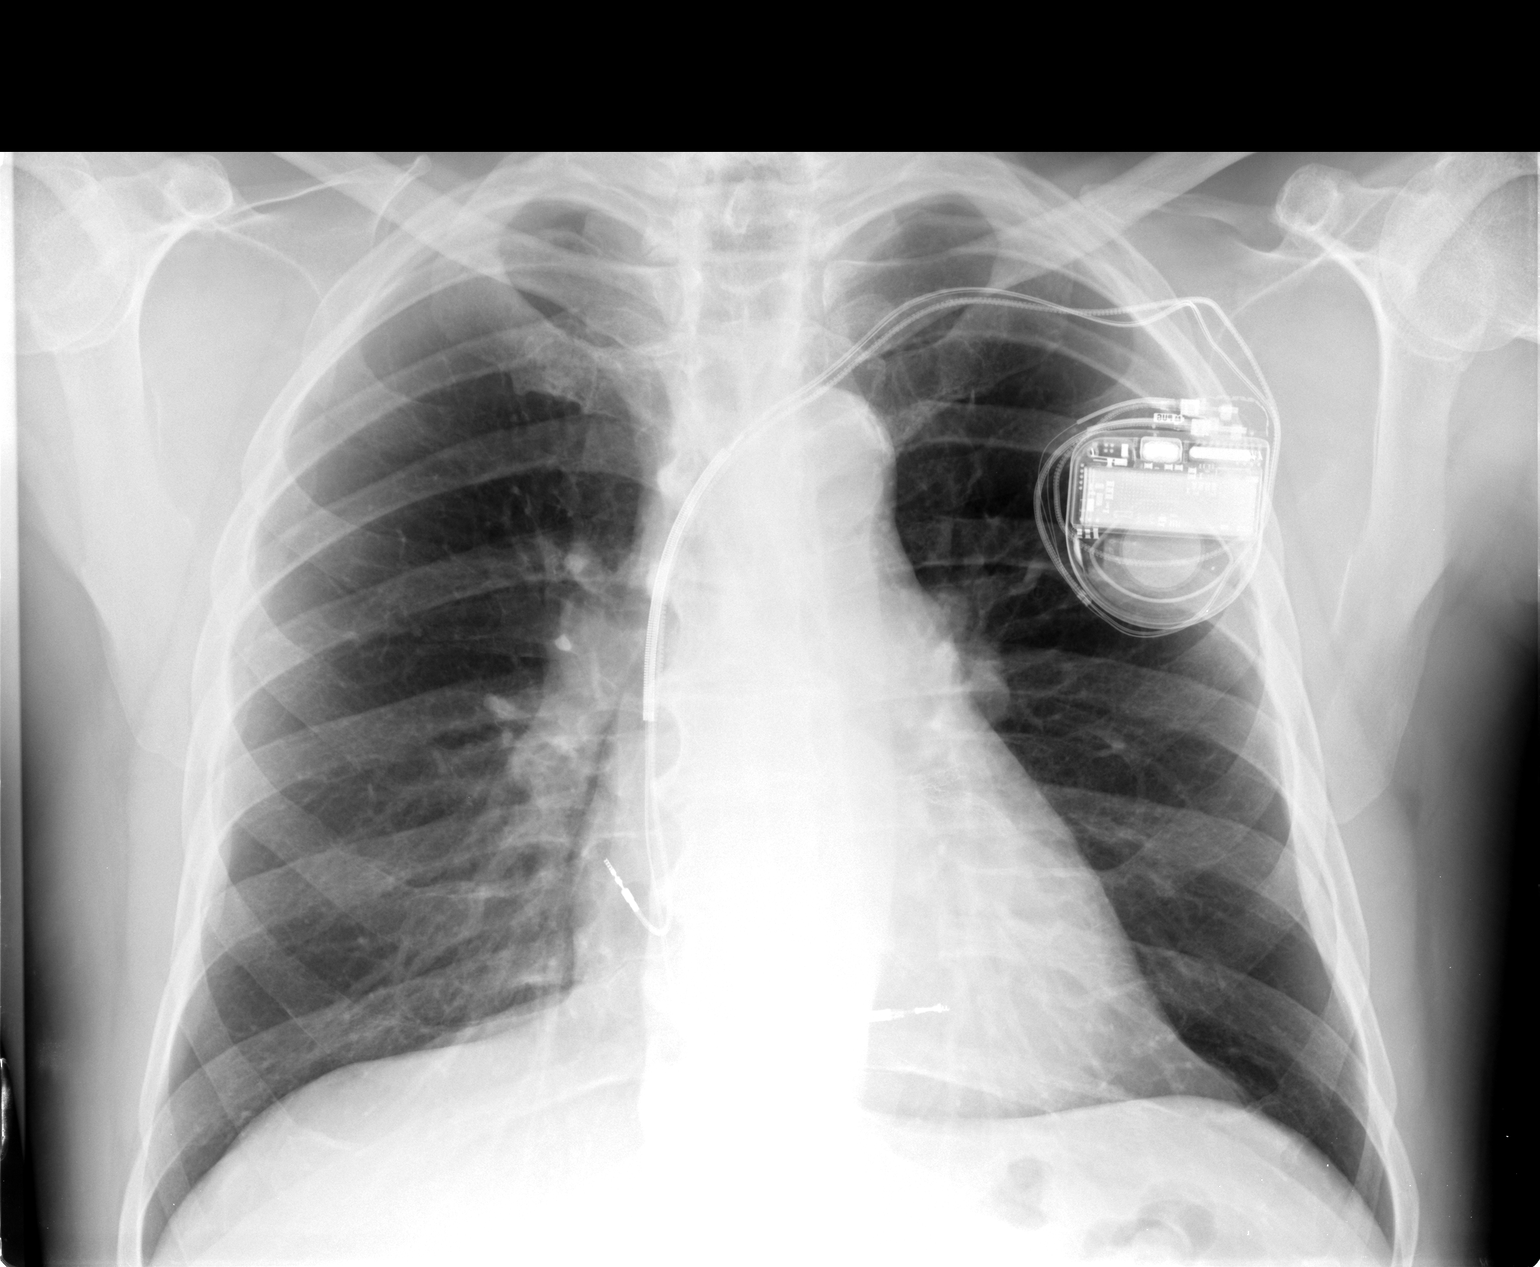

[view not recorded (2 of 2)]
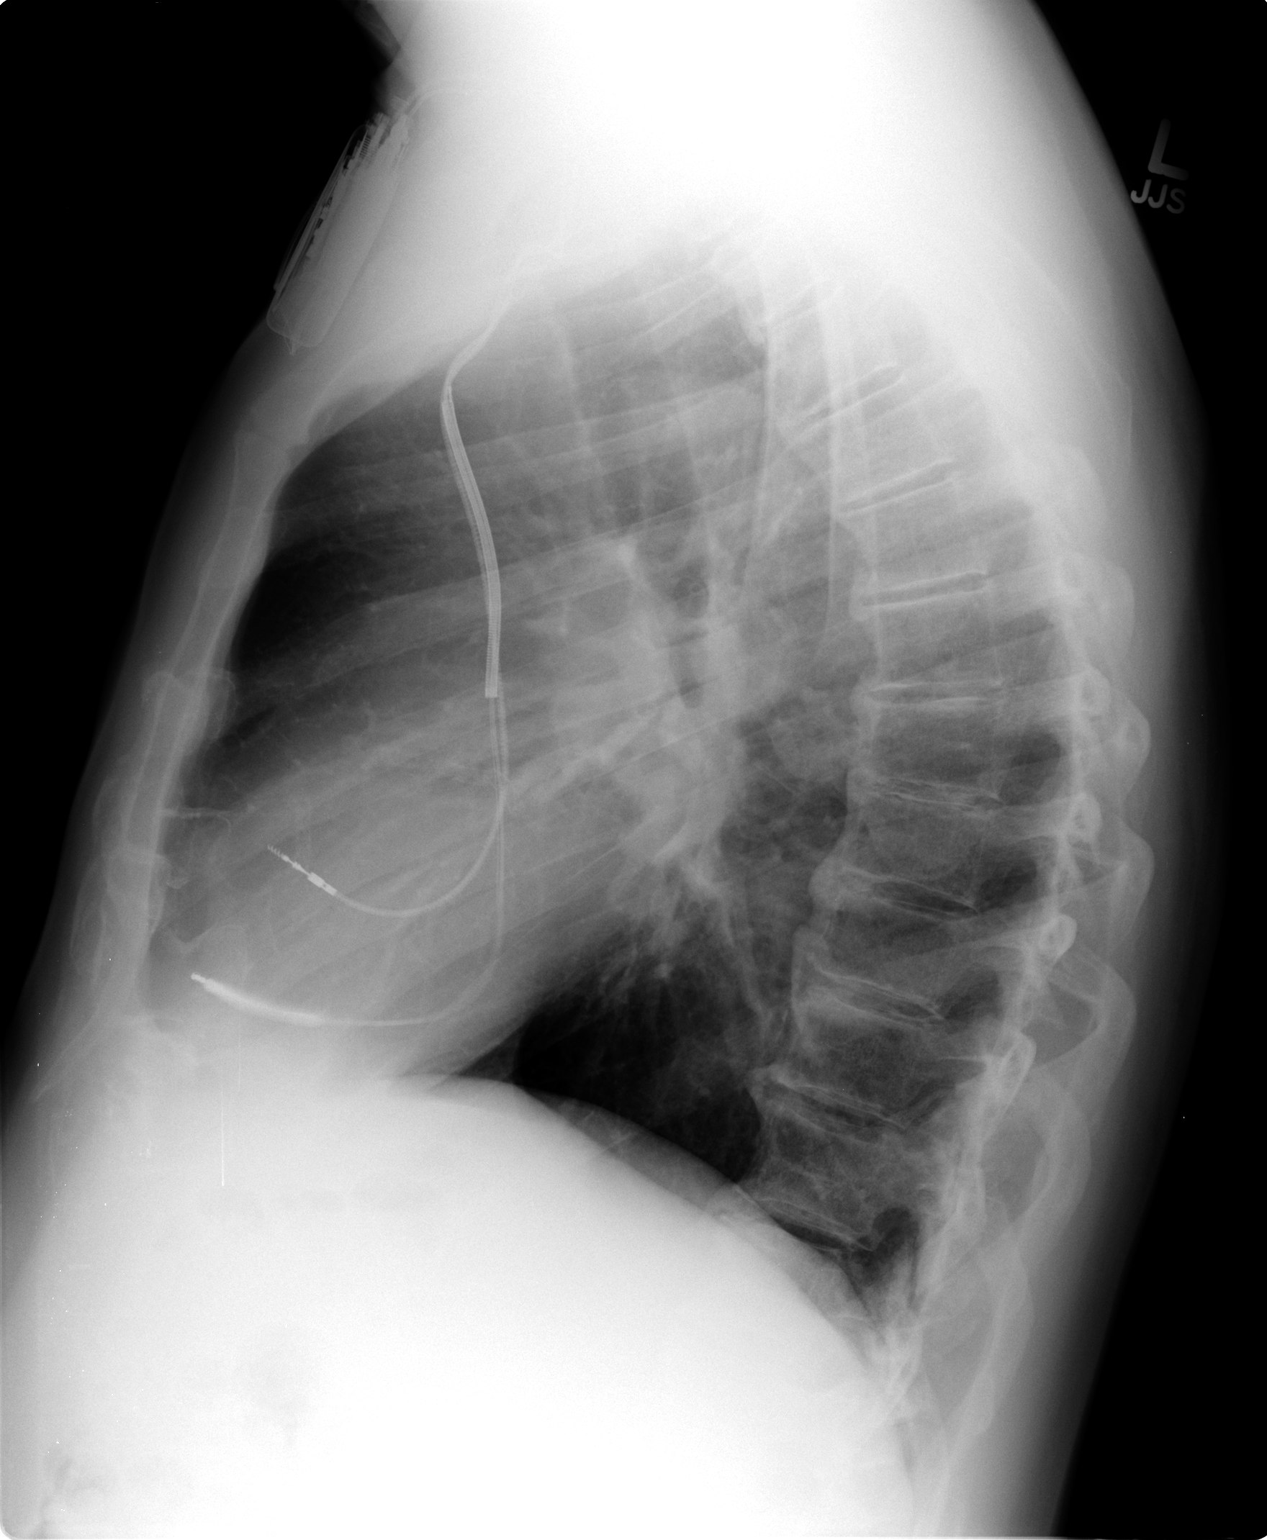

[2 of 2 positions shown; findings below may reference images not displayed]

FINDINGS: Cardiac silhouette is upper normal size.  Dual lead AICD
is in place with controller device on the left.  Mediastinal and
hilar contours appear normal. Ectasia and nonaneurysmal
calcification of the thoracic aorta are seen.  There is flattening
of the diaphragm on lateral image consistent with overall
hyperinflation configuration.  No pulmonary edema, pneumonia, or
pleural effusion is evident. Noncalcific pleural thickening lateral
left.  Osteophytes are present in the spine.
IMPRESSION: AICD in place.  Hyperinflation configuration consistent with COPD.
No acute superimposed abnormality is evident. Chronic stable
findings are detailed above.

## 2013-03-06 ENCOUNTER — Other Ambulatory Visit: Payer: Self-pay

## 2013-03-06 MED ORDER — ZOLPIDEM TARTRATE 5 MG PO TABS: 10.0000 mg | ORAL_TABLET | Freq: Every evening | ORAL | Status: DC | PRN

## 2013-03-06 NOTE — Telephone Encounter (Signed)
Zolpidem has been called to pharmacy  

## 2013-05-01 ENCOUNTER — Encounter: Payer: Medicare Other | Admitting: *Deleted

## 2013-05-17 ENCOUNTER — Encounter: Payer: Self-pay | Admitting: *Deleted

## 2013-06-21 ENCOUNTER — Encounter: Payer: Self-pay | Admitting: Internal Medicine

## 2013-06-24 ENCOUNTER — Ambulatory Visit (INDEPENDENT_AMBULATORY_CARE_PROVIDER_SITE_OTHER): Payer: Medicare Other | Admitting: *Deleted

## 2013-06-24 ENCOUNTER — Encounter: Payer: Self-pay | Admitting: Internal Medicine

## 2013-06-24 ENCOUNTER — Telehealth: Payer: Self-pay | Admitting: Internal Medicine

## 2013-06-24 DIAGNOSIS — I428 Other cardiomyopathies: Secondary | ICD-10-CM

## 2013-06-24 LAB — MDC_IDC_ENUM_SESS_TYPE_REMOTE
Brady Statistic AP VS Percent: 46.68 %
Brady Statistic AS VP Percent: 0 %
Brady Statistic AS VS Percent: 52.64 %
Brady Statistic RA Percent Paced: 47.36 %
Brady Statistic RV Percent Paced: 0.68 %
Date Time Interrogation Session: 20150427201745
HIGH POWER IMPEDANCE MEASURED VALUE: 50 Ohm
HighPow Impedance: 68 Ohm
Lead Channel Impedance Value: 475 Ohm
Lead Channel Sensing Intrinsic Amplitude: 24.625 mV
Lead Channel Setting Pacing Amplitude: 2 V
Lead Channel Setting Pacing Amplitude: 2.5 V
MDC IDC MSMT BATTERY VOLTAGE: 2.66 V
MDC IDC MSMT LEADCHNL RA IMPEDANCE VALUE: 494 Ohm
MDC IDC MSMT LEADCHNL RA SENSING INTR AMPL: 3.875 mV
MDC IDC SET LEADCHNL RV PACING PULSEWIDTH: 0.4 ms
MDC IDC SET LEADCHNL RV SENSING SENSITIVITY: 0.45 mV
MDC IDC SET ZONE DETECTION INTERVAL: 240 ms
MDC IDC SET ZONE DETECTION INTERVAL: 350 ms
MDC IDC STAT BRADY AP VP PERCENT: 0.68 %
Zone Setting Detection Interval: 250 ms
Zone Setting Detection Interval: 280 ms
Zone Setting Detection Interval: 450 ms

## 2013-06-24 NOTE — Telephone Encounter (Signed)
New message     Pt was in [place] last week and they told him his device needs a new battery.  Please call

## 2013-06-24 NOTE — Telephone Encounter (Signed)
Patient to send remote transmission so we can determine if he is at [facility].

## 2013-07-12 NOTE — Progress Notes (Signed)
Remote ICD transmission.   

## 2013-07-26 ENCOUNTER — Encounter: Payer: Self-pay | Admitting: Cardiology

## 2013-08-07 ENCOUNTER — Other Ambulatory Visit: Payer: Self-pay | Admitting: Internal Medicine

## 2013-08-12 ENCOUNTER — Encounter: Payer: Self-pay | Admitting: Internal Medicine

## 2013-08-12 ENCOUNTER — Ambulatory Visit (INDEPENDENT_AMBULATORY_CARE_PROVIDER_SITE_OTHER): Payer: Medicare Other | Admitting: Internal Medicine

## 2013-08-12 VITALS — BP 128/68 | HR 81 | Ht 70.0 in | Wt 185.0 lb

## 2013-08-12 DIAGNOSIS — Z95810 Presence of automatic (implantable) cardiac defibrillator: Secondary | ICD-10-CM

## 2013-08-12 DIAGNOSIS — I5022 Chronic systolic (congestive) heart failure: Secondary | ICD-10-CM

## 2013-08-12 DIAGNOSIS — I2589 Other forms of chronic ischemic heart disease: Secondary | ICD-10-CM

## 2013-08-12 DIAGNOSIS — I4891 Unspecified atrial fibrillation: Secondary | ICD-10-CM

## 2013-08-12 DIAGNOSIS — I255 Ischemic cardiomyopathy: Secondary | ICD-10-CM

## 2013-08-12 DIAGNOSIS — I4901 Ventricular fibrillation: Secondary | ICD-10-CM

## 2013-08-12 LAB — MDC_IDC_ENUM_SESS_TYPE_INCLINIC
Brady Statistic AP VP Percent: 0.45 %
Brady Statistic AP VS Percent: 42.68 %
Brady Statistic AS VP Percent: 0.02 %
Brady Statistic RA Percent Paced: 43.13 %
Brady Statistic RV Percent Paced: 0.47 %
HIGH POWER IMPEDANCE MEASURED VALUE: 48 Ohm
HIGH POWER IMPEDANCE MEASURED VALUE: 65 Ohm
Lead Channel Impedance Value: 437 Ohm
Lead Channel Pacing Threshold Amplitude: 0.5 V
Lead Channel Pacing Threshold Pulse Width: 0.4 ms
Lead Channel Sensing Intrinsic Amplitude: 20 mV
Lead Channel Sensing Intrinsic Amplitude: 5.25 mV
Lead Channel Setting Pacing Amplitude: 2 V
Lead Channel Setting Pacing Amplitude: 2.5 V
MDC IDC MSMT BATTERY VOLTAGE: 2.64 V
MDC IDC MSMT LEADCHNL RA IMPEDANCE VALUE: 494 Ohm
MDC IDC MSMT LEADCHNL RA PACING THRESHOLD PULSEWIDTH: 0.4 ms
MDC IDC MSMT LEADCHNL RV PACING THRESHOLD AMPLITUDE: 1 V
MDC IDC SESS DTM: 20150615154620
MDC IDC SET LEADCHNL RV PACING PULSEWIDTH: 0.4 ms
MDC IDC SET LEADCHNL RV SENSING SENSITIVITY: 0.45 mV
MDC IDC SET ZONE DETECTION INTERVAL: 250 ms
MDC IDC SET ZONE DETECTION INTERVAL: 350 ms
MDC IDC STAT BRADY AS VS PERCENT: 56.85 %
Zone Setting Detection Interval: 240 ms
Zone Setting Detection Interval: 280 ms
Zone Setting Detection Interval: 450 ms

## 2013-08-12 LAB — BASIC METABOLIC PANEL
BUN / CREAT RATIO: 19 (ref 10–22)
BUN: 20 mg/dL (ref 8–27)
CHLORIDE: 97 mmol/L (ref 96–108)
CO2: 27 mmol/L (ref 18–29)
Calcium: 9.9 mg/dL (ref 8.6–10.2)
Creatinine, Ser: 1.08 mg/dL (ref 0.76–1.27)
GFR calc Af Amer: 83 mL/min/{1.73_m2} (ref >59)
GFR calc non Af Amer: 72 mL/min/{1.73_m2} (ref >59)
GLUCOSE: 190 mg/dL — AB (ref 65–99)
POTASSIUM: 4.4 mmol/L (ref 3.5–5.2)
Sodium: 137 mmol/L (ref 134–144)

## 2013-08-12 LAB — MAGNESIUM: Magnesium: 1.7 mg/dL (ref 1.6–2.6)

## 2013-08-12 MED ORDER — LISINOPRIL 2.5 MG PO TABS: 2.5000 mg | ORAL_TABLET | Freq: Every day | ORAL | Status: DC

## 2013-08-12 NOTE — Progress Notes (Signed)
Patient Care Team: Neena Rhymes, MD as PCP - General   HPI  [name] is a 68 y.o. male Seen in followup for aborted cardiac arrest, atrial fibrillation and most recently an episode of syncope this occurs in the context of  ischemic cardiomyopathy with prior stenting of his LAD circumflex and RCA. Catheterization 1/14 demonstrated no obstructive and and patent grafts.      He subsequently underwent convergent ablation at [facility]. He management post ablation dofetilide.  He has ischemic heart disease with prior stenting  Echocardiogram 8/14  Demonstrated ejecti on fraction 20% and inferoposterior akinesis. There is moderate left atrial enlargement.  He feels much better since having had his ablation. He denies chest pain. There is exercise intolerance;  there have been no edema      Past Medical History  Diagnosis Date  . Ischemic cardiomyopathy 09/22/2008    DES CX and RCA  70% residual LAD  Done in [place]; EF 30%;; myoviewq 06-14-2006 no ischemia  . Implantable cardiac defibrillator MDT 09/22/2008    Change out feb 2011  . NEPHROLITHIASIS, HX OF 02/25/2008  . CALLUS, RIGHT FOOT 12/08/2009  . Chronic systolic heart failure 0/0/9381  . Persistent atrial fibrillation 11/25/2006    Inapp shock 06/14/10 AF VR >220 recurrent  . HYPERLIPIDEMIA 11/25/2006  . HYPOGONADISM, MALE 12/05/2006  . 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 DIABETES MELLITUS, TYPE II 12/05/2006  . 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 Long term (current) use of anticoagulants 04/25/2010  . ICD (implantable cardiac defibrillator) in place     medtronic  . Myocardial infarction June 13, 2004    "I died and they brought me back"  . Kidney stones     Past Surgical History  Procedure Laterality Date  . Cataract extraction    . Tonsillectomy    . Cardiac defibrillator placement      Guidant Vitality [name]  . Ptca    . Tonsillectomy    . Eye surgery      cataracts  . Cardiac catheterization     feburary 06-13-12  . Cardioversion N/A 04/25/2012    Procedure: CARDIOVERSION;  Surgeon: Darlin Coco, MD;  Location: [place];  Service: Cardiovascular;  Laterality: N/A;  . Cardioversion N/A 05/21/2012    Procedure: CARDIOVERSION;  Surgeon: Deboraha Sprang, MD;  Location: [facility] ENDOSCOPY;  Service: Cardiovascular;  Laterality: N/A;    Current Outpatient Prescriptions  Medication Sig Dispense Refill  . acetaminophen (TYLENOL) 325 MG tablet Take 650 mg by mouth as needed.      . dofetilide (TIKOSYN) 500 MCG capsule Take 500 mcg by mouth 2 (two) times daily.      . furosemide (LASIX) 20 MG tablet TAKE ONE TABLET BY MOUTH EVERY DAY  90 tablet  0  . loratadine (CLARITIN) 10 MG tablet Take 10 mg by mouth as needed.       . Magnesium Oxide (MAG-OX 400 PO) Take 1 tablet by mouth 2 (two) times daily.      . metFORMIN (GLUCOPHAGE) 1000 MG tablet TAKE ONE TABLET BY MOUTH TWICE DAILY WITH MEALS  100 tablet  5  . metoprolol (LOPRESSOR) 50 MG tablet Take 1 tablet (50 mg total) by mouth 2 (two) times daily.  60 tablet  5  . nitroGLYCERIN (NITROSTAT) 0.4 MG SL tablet Place 0.4 mg under the tongue every 5 (five) minutes as needed. For chest pain      . Rivaroxaban (XARELTO) 20 MG TABS tablet Take  20 mg by mouth daily with supper.      . Tamsulosin HCl (FLOMAX) 0.4 MG CAPS Take 0.4 mg by mouth daily as needed.       . zolpidem (AMBIEN) 5 MG tablet Take 2 tablets (10 mg total) by mouth at bedtime as needed for sleep.  30 tablet  5  . colchicine (COLCRYS) 0.6 MG tablet Take 0.6 mg by mouth 2 (two) times daily.       No current facility-administered medications for this visit.    Allergies  Allergen Reactions  . Salmon [Fish Allergy] Other (See Comments)    Only canned/ not fresh (probably preservatives).     Review of Systems negative except from HPI and PMH  Physical Exam BP 128/68  Pulse 81  Ht 5\' 10"  (1.778 m)  Wt 185 lb (83.915 kg)  BMI 26.54 kg/m2 Well developed and nourished in no acute  distress HENT normal Neck supple with JVP-flat Clear Regular rate and rhythm, no murmurs or gallops Abd-soft with active BS No Clubbing cyanosis edema Skin-warm and dry A & Oriented  Grossly normal sensory and motor function\  ECG demonstrates atrial pacing at 81 Intervals 22/14/46  Assessment and  Plan  Atrial fibrillation s/p convergent ablation on dofetilide  Ischemic cardiomyopathy with three-vessel stenting and EF 20%  Congestive heart failure-chronic-systolic  Implantable defibrillator-Medtronic The patient's device was interrogated.  The information was reviewed. No changes were made in the programming.    Hypotension There is no significant symptoms of ischemia. Catheterization demonstrated patent grafts 1/14  He is euvolemic  Per his cardiomyopathy however, he should be on an ACE inhibitor and a guideline directed beta blocker.  he will let us know which beta blockers he previously took and will work on getting him on something other than metoprolol tartrate.    We'll also begin him on lisinopril. We'll check a metabolic profile and magnesium level for this as well as his dofetilide in 2 weeks time. We will recess ventricular function following his convergent ablation procedure  There've been no significant atrial fibrillation; we will continue him on dofetilide  No intercurrent ventricular arrhythmias

## 2013-08-12 NOTE — Patient Instructions (Addendum)
Your physician has recommended you make the following change in your medication:  1) START Lisinopril 2.5 mg daily  Your physician recommends that you return for lab work in: 2-3 weeks for BMET, Magnesium   Your physician has requested that you have an echocardiogram. Echocardiography is a painless test that uses sound waves to create images of your heart. It provides your doctor with information about the size and shape of your heart and how well your heart's chambers and valves are working. This procedure takes approximately one hour. There are no restrictions for this procedure.  Remote monitoring is used to monitor your Pacemaker of ICD from home. This monitoring reduces the number of office visits required to check your device to one time per year. It allows [place] to keep an eye on the functioning of your device to ensure it is working properly. You are scheduled for a device check from home on 09/11/13. You may send your transmission at any time that day. If you have a wireless device, the transmission will be sent automatically. After your physician reviews your transmission, you will receive a postcard with your next transmission date.  Your physician wants you to follow-up in: 6 months with Dr. Caryl Comes.  You will receive a reminder letter in the mail two months in advance. If you don't receive a letter, please call our office to schedule the follow-up appointment.

## 2013-08-14 ENCOUNTER — Encounter: Payer: Self-pay | Admitting: Internal Medicine

## 2013-09-03 ENCOUNTER — Ambulatory Visit (HOSPITAL_COMMUNITY): Payer: Medicare Other | Attending: Internal Medicine | Admitting: Radiology

## 2013-09-03 DIAGNOSIS — I4891 Unspecified atrial fibrillation: Secondary | ICD-10-CM | POA: Insufficient documentation

## 2013-09-03 NOTE — Progress Notes (Signed)
Echocardiogram performed.  

## 2013-09-11 ENCOUNTER — Telehealth: Payer: Self-pay | Admitting: Cardiology

## 2013-09-11 ENCOUNTER — Ambulatory Visit: Payer: Medicare Other | Admitting: *Deleted

## 2013-09-11 NOTE — Telephone Encounter (Signed)
Spoke with pt and reminded pt of remote transmission that is due today. Pt verbalized understanding.   

## 2013-09-12 ENCOUNTER — Encounter: Payer: Self-pay | Admitting: Cardiology

## 2013-09-13 NOTE — Progress Notes (Deleted)
Remote ICD transmission.   

## 2013-09-16 ENCOUNTER — Telehealth: Payer: Self-pay

## 2013-09-17 MED ORDER — ZOLPIDEM TARTRATE 5 MG PO TABS: 10.0000 mg | ORAL_TABLET | Freq: Every evening | ORAL | Status: DC | PRN

## 2013-09-17 NOTE — Telephone Encounter (Signed)
Done hardcopy to [facility]  

## 2013-09-17 NOTE — Telephone Encounter (Signed)
Faxed hardcopy of Zolpidem to [facility]

## 2013-11-05 ENCOUNTER — Telehealth: Payer: Self-pay | Admitting: Internal Medicine

## 2013-11-05 NOTE — Telephone Encounter (Signed)
Pt states that his monitor will not come on at all. He states he has unplugged and and rebooted monitor and he still can not get monitor to turn on. Pt stated that he was told his battery was near ERI and that [place] for a [place] or so and he does not want his device to hit that point while he is out of country. I informed pt that we can monitor battery life from his home monitor once that is fixed and that he has to wait until he hits ERI to have his battery changed. Pt verbalized understanding and he will call tech support to receive help trouble shooting monitor.

## 2013-11-05 NOTE — Telephone Encounter (Signed)
New message      Pt is unable to check his ICD from home---please call

## 2013-11-12 ENCOUNTER — Encounter: Payer: Self-pay | Admitting: Internal Medicine

## 2013-11-12 ENCOUNTER — Ambulatory Visit (INDEPENDENT_AMBULATORY_CARE_PROVIDER_SITE_OTHER): Payer: Medicare Other | Admitting: *Deleted

## 2013-11-12 DIAGNOSIS — I428 Other cardiomyopathies: Secondary | ICD-10-CM

## 2013-11-13 NOTE — Progress Notes (Signed)
Remote ICD transmission.   

## 2013-11-27 ENCOUNTER — Other Ambulatory Visit: Payer: Self-pay | Admitting: Internal Medicine

## 2013-12-01 LAB — MDC_IDC_ENUM_SESS_TYPE_REMOTE
Battery Voltage: 2.61 V
Brady Statistic AS VP Percent: 0.02 %
Brady Statistic RA Percent Paced: 55.3 %
Date Time Interrogation Session: 20150915233136
HIGH POWER IMPEDANCE MEASURED VALUE: 62 Ohm
HighPow Impedance: 46 Ohm
Lead Channel Impedance Value: 437 Ohm
Lead Channel Impedance Value: 437 Ohm
Lead Channel Setting Pacing Amplitude: 2 V
Lead Channel Setting Pacing Amplitude: 2.5 V
Lead Channel Setting Sensing Sensitivity: 0.45 mV
MDC IDC MSMT LEADCHNL RA SENSING INTR AMPL: 4 mV
MDC IDC MSMT LEADCHNL RV SENSING INTR AMPL: 29.75 mV
MDC IDC SET LEADCHNL RV PACING PULSEWIDTH: 0.4 ms
MDC IDC SET ZONE DETECTION INTERVAL: 240 ms
MDC IDC STAT BRADY AP VP PERCENT: 0.28 %
MDC IDC STAT BRADY AP VS PERCENT: 55.02 %
MDC IDC STAT BRADY AS VS PERCENT: 44.68 %
MDC IDC STAT BRADY RV PERCENT PACED: 0.3 %
Zone Setting Detection Interval: 250 ms
Zone Setting Detection Interval: 280 ms
Zone Setting Detection Interval: 350 ms
Zone Setting Detection Interval: 450 ms

## 2013-12-17 ENCOUNTER — Encounter: Payer: Self-pay | Admitting: Cardiology

## 2013-12-18 ENCOUNTER — Encounter: Payer: Self-pay | Admitting: Internal Medicine

## 2013-12-18 ENCOUNTER — Ambulatory Visit (INDEPENDENT_AMBULATORY_CARE_PROVIDER_SITE_OTHER): Payer: Medicare Other | Admitting: Internal Medicine

## 2013-12-18 VITALS — BP 112/80 | HR 84 | Ht 70.0 in | Wt 192.6 lb

## 2013-12-18 DIAGNOSIS — I5022 Chronic systolic (congestive) heart failure: Secondary | ICD-10-CM

## 2013-12-18 DIAGNOSIS — I4901 Ventricular fibrillation: Secondary | ICD-10-CM

## 2013-12-18 DIAGNOSIS — I480 Paroxysmal atrial fibrillation: Secondary | ICD-10-CM

## 2013-12-18 DIAGNOSIS — Z23 Encounter for immunization: Secondary | ICD-10-CM

## 2013-12-18 DIAGNOSIS — Z4502 Encounter for adjustment and management of automatic implantable cardiac defibrillator: Secondary | ICD-10-CM

## 2013-12-18 LAB — MDC_IDC_ENUM_SESS_TYPE_INCLINIC
Battery Voltage: 2.61 V
Brady Statistic AP VP Percent: 0.28 %
Brady Statistic AP VS Percent: 54.84 %
Brady Statistic AS VP Percent: 0.03 %
Brady Statistic AS VS Percent: 44.85 %
Brady Statistic RA Percent Paced: 55.12 %
Brady Statistic RV Percent Paced: 0.31 %
HIGH POWER IMPEDANCE MEASURED VALUE: 48 Ohm
HighPow Impedance: 63 Ohm
Lead Channel Impedance Value: 437 Ohm
Lead Channel Pacing Threshold Amplitude: 0.75 V
Lead Channel Pacing Threshold Amplitude: 1 V
Lead Channel Pacing Threshold Pulse Width: 0.4 ms
Lead Channel Sensing Intrinsic Amplitude: 29.25 mV
Lead Channel Sensing Intrinsic Amplitude: 4.625 mV
Lead Channel Setting Pacing Amplitude: 2 V
Lead Channel Setting Pacing Amplitude: 2.5 V
Lead Channel Setting Pacing Pulse Width: 0.4 ms
Lead Channel Setting Sensing Sensitivity: 0.45 mV
MDC IDC MSMT LEADCHNL RA IMPEDANCE VALUE: 475 Ohm
MDC IDC MSMT LEADCHNL RA PACING THRESHOLD PULSEWIDTH: 0.4 ms
MDC IDC SESS DTM: 20151021192038
MDC IDC SET ZONE DETECTION INTERVAL: 240 ms
MDC IDC SET ZONE DETECTION INTERVAL: 250 ms
Zone Setting Detection Interval: 280 ms
Zone Setting Detection Interval: 350 ms
Zone Setting Detection Interval: 450 ms

## 2013-12-18 MED ORDER — CARVEDILOL 6.25 MG PO TABS: 6.2500 mg | ORAL_TABLET | Freq: Two times a day (BID) | ORAL | Status: DC

## 2013-12-18 NOTE — Progress Notes (Signed)
Patient Care Team: Neena Rhymes, MD as PCP - General   HPI  [name] is a 68 y.o. male Seen in followup for aborted cardiac arrest, atrial fibrillation and most recently an episode of syncope this occurs in the context of  ischemic cardiomyopathy with prior stenting of his LAD circumflex and RCA. Catheterization 1/14 demonstrated no obstructive and and patent grafts.      He subsequently underwent convergent ablation at [facility]. He management post ablation dofetilide.  He has ischemic heart disease with prior stenting  Echocardiogram 8/14  Demonstrated ejecti on fraction 20% and inferoposterior akinesis. There is moderate left atrial enlargement. There was no interval improvement in ejection fraction following convergent ablation. It was 20-25% 7/15  He feels much better since having had his ablation. He denies chest pain. There is exercise intolerance;  there have been no edema     Intent at his last visit was to get him on a Directed beta blocker therapy and uptitrated on his ACE inhibitor   Past Medical History  Diagnosis Date  . Ischemic cardiomyopathy 09/22/2008    DES CX and RCA  70% residual LAD  Done [place]; EF 30%;; myoviewq June 25, 2006 no ischemia  . Implantable cardiac defibrillator MDT 09/22/2008    Change out feb 2011  . NEPHROLITHIASIS, HX OF 02/25/2008  . CALLUS, RIGHT FOOT 12/08/2009  . Chronic systolic heart failure 4/0/9811  . Persistent atrial fibrillation 11/25/2006    Inapp shock June 25, 2010 AF VR >220 recurrent  . HYPERLIPIDEMIA 11/25/2006  . HYPOGONADISM, MALE 12/05/2006  . 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 DIABETES MELLITUS, TYPE II 12/05/2006  . 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 Long term (current) use of anticoagulants 04/25/2010  . ICD (implantable cardiac defibrillator) in place     medtronic  . Myocardial infarction 06/24/2004    "I died and they brought me back"  . Kidney stones     Past Surgical History  Procedure  Laterality Date  . Cataract extraction    . Tonsillectomy    . Cardiac defibrillator placement      Guidant Vitality [name]  . Ptca    . Tonsillectomy    . Eye surgery      cataracts  . Cardiac catheterization      feburary 2012-06-24  . Cardioversion N/A 04/25/2012    Procedure: CARDIOVERSION;  Surgeon: Darlin Coco, MD;  Location: [place];  Service: Cardiovascular;  Laterality: N/A;  . Cardioversion N/A 05/21/2012    Procedure: CARDIOVERSION;  Surgeon: Deboraha Sprang, MD;  Location: [facility] ENDOSCOPY;  Service: Cardiovascular;  Laterality: N/A;    Current Outpatient Prescriptions  Medication Sig Dispense Refill  . acetaminophen (TYLENOL) 325 MG tablet Take 650 mg by mouth as needed.      . dofetilide (TIKOSYN) 500 MCG capsule Take 500 mcg by mouth 2 (two) times daily.      . furosemide (LASIX) 20 MG tablet TAKE ONE TABLET BY MOUTH EVERY DAY  90 tablet  0  . lisinopril (PRINIVIL,ZESTRIL) 2.5 MG tablet Take 1 tablet (2.5 mg total) by mouth daily.  30 tablet  3  . loratadine (CLARITIN) 10 MG tablet Take 10 mg by mouth as needed.       . Magnesium Oxide (MAG-OX 400 PO) Take 1 tablet by mouth 2 (two) times daily.      . metFORMIN (GLUCOPHAGE) 1000 MG tablet TAKE ONE TABLET BY MOUTH TWICE DAILY WITH MEALS  100 tablet  5  .  metoprolol (LOPRESSOR) 50 MG tablet Take 1 tablet (50 mg total) by mouth 2 (two) times daily.  60 tablet  5  . nitroGLYCERIN (NITROSTAT) 0.4 MG SL tablet Place 0.4 mg under the tongue every 5 (five) minutes as needed. For chest pain      . Rivaroxaban (XARELTO) 20 MG TABS tablet Take 20 mg by mouth daily with supper.      . Tamsulosin HCl (FLOMAX) 0.4 MG CAPS Take 0.4 mg by mouth daily as needed.       . zolpidem (AMBIEN) 5 MG tablet Take 2 tablets (10 mg total) by mouth at bedtime as needed for sleep.  60 tablet  2   No current facility-administered medications for this visit.    Allergies  Allergen Reactions  . Salmon [Fish Allergy] Other (See Comments)    Only  canned/ not fresh (probably preservatives).     Review of Systems negative except from HPI and PMH  Physical Exam BP 112/80  Pulse 84  Ht 5\' 10"  (1.778 m)  Wt 192 lb 9.6 oz (87.363 kg)  BMI 27.64 kg/m2 Well developed and nourished in no acute distress HENT normal Neck supple with JVP-flat Clear Regular rate and rhythm, no murmurs or gallops Abd-soft with active BS No Clubbing cyanosis edema Skin-warm and dry A & Oriented  Grossly normal sensory and motor function\  ECG demonstrates atrial pacing at 81 Intervals 22/14/46  Assessment and  Plan  Atrial fibrillation s/p convergent ablation on dofetilide  Ischemic cardiomyopathy with three-vessel stenting and EF 20%  Congestive heart failure-chronic-systolic  Implantable defibrillator-Medtronic The patient's device was interrogated.  The information was reviewed. No changes were made in the programming.    Hypotension  Better  There is no significant symptoms of ischemia. Catheterization demonstrated patent grafts 1/14  He is euvolemic  With  his cardiomyopathy however, he should be on an ACE inhibitor and a guideline directed beta blocker.  he will let us know which beta blockers he previously took and will work on getting him on something other than metoprolol tartrate.   There've been no significant atrial fibrillation; we will continue him on dofetilide  No intercurrent ventricular arrhythmias

## 2013-12-18 NOTE — Patient Instructions (Signed)
Your physician has recommended you make the following change in your medication:  1.) stop metoprolol 2.) start carvedilol 6.25 mg twice daily  Your physician recommends that you schedule a follow-up appointment in: 6 weeks with device clinic.

## 2013-12-23 ENCOUNTER — Encounter: Payer: Medicare Other | Admitting: Internal Medicine

## 2013-12-27 ENCOUNTER — Encounter: Payer: Medicare Other | Admitting: Internal Medicine

## 2013-12-31 ENCOUNTER — Ambulatory Visit (INDEPENDENT_AMBULATORY_CARE_PROVIDER_SITE_OTHER): Payer: Medicare Other | Admitting: *Deleted

## 2013-12-31 DIAGNOSIS — I5022 Chronic systolic (congestive) heart failure: Secondary | ICD-10-CM

## 2013-12-31 DIAGNOSIS — I4901 Ventricular fibrillation: Secondary | ICD-10-CM

## 2013-12-31 LAB — MDC_IDC_ENUM_SESS_TYPE_INCLINIC
Battery Voltage: 2.61 V
Brady Statistic AP VP Percent: 0.17 %
Brady Statistic AS VP Percent: 0.03 %
Brady Statistic RA Percent Paced: 25.35 %
Brady Statistic RV Percent Paced: 0.19 %
HIGH POWER IMPEDANCE MEASURED VALUE: 50 Ohm
HIGH POWER IMPEDANCE MEASURED VALUE: 68 Ohm
Lead Channel Impedance Value: 494 Ohm
Lead Channel Sensing Intrinsic Amplitude: 4.375 mV
Lead Channel Sensing Intrinsic Amplitude: 4.375 mV
Lead Channel Setting Pacing Amplitude: 2 V
Lead Channel Setting Pacing Pulse Width: 0.4 ms
Lead Channel Setting Sensing Sensitivity: 0.45 mV
MDC IDC MSMT LEADCHNL RV IMPEDANCE VALUE: 475 Ohm
MDC IDC MSMT LEADCHNL RV SENSING INTR AMPL: 28 mV
MDC IDC MSMT LEADCHNL RV SENSING INTR AMPL: 28 mV
MDC IDC SESS DTM: 20151103100504
MDC IDC SET LEADCHNL RV PACING AMPLITUDE: 2.5 V
MDC IDC SET ZONE DETECTION INTERVAL: 250 ms
MDC IDC SET ZONE DETECTION INTERVAL: 350 ms
MDC IDC SET ZONE DETECTION INTERVAL: 450 ms
MDC IDC STAT BRADY AP VS PERCENT: 25.18 %
MDC IDC STAT BRADY AS VS PERCENT: 74.63 %
Zone Setting Detection Interval: 240 ms
Zone Setting Detection Interval: 280 ms

## 2013-12-31 NOTE — Progress Notes (Signed)
ICD check in clinic. Battery @ ERI 12/30/13, alert programmed off.  2.2% A-fib, + xarelto.  Optivol and thoracic impedance abnormal 11/1 ongoing, patient is asymptomatic.  ROV 11/19@ 2pm with Dr. Caryl Comes to discuss change out.

## 2014-01-01 ENCOUNTER — Encounter: Payer: Self-pay | Admitting: Cardiology

## 2014-01-14 ENCOUNTER — Encounter: Payer: Self-pay | Admitting: Cardiovascular Disease

## 2014-01-16 ENCOUNTER — Encounter: Payer: Self-pay | Admitting: Internal Medicine

## 2014-01-16 ENCOUNTER — Ambulatory Visit (INDEPENDENT_AMBULATORY_CARE_PROVIDER_SITE_OTHER): Payer: Medicare Other | Admitting: Internal Medicine

## 2014-01-16 VITALS — BP 122/72 | HR 76 | Ht 70.0 in | Wt 189.0 lb

## 2014-01-16 DIAGNOSIS — I255 Ischemic cardiomyopathy: Secondary | ICD-10-CM

## 2014-01-16 DIAGNOSIS — I480 Paroxysmal atrial fibrillation: Secondary | ICD-10-CM

## 2014-01-16 DIAGNOSIS — Z4502 Encounter for adjustment and management of automatic implantable cardiac defibrillator: Secondary | ICD-10-CM

## 2014-01-16 DIAGNOSIS — Z01812 Encounter for preprocedural laboratory examination: Secondary | ICD-10-CM

## 2014-01-16 NOTE — Progress Notes (Signed)
Patient Care Team: Neena Rhymes, MD as PCP - General   HPI  [name] is a 68 y.o. male Seen in followup for aborted cardiac arrest, atrial fibrillation and most recently an episode of syncope this occurs in the context of  ischemic cardiomyopathy with prior stenting of his LAD circumflex and RCA. Catheterization 1/14 demonstrated no obstructive and and patent grafts. He is status post ICD implantation. His device now is at [facility]     He subsequently underwent convergent ablation at [facility]. He management post ablation dofetilide.  He has ischemic heart disease with prior stenting  Echocardiogram 8/14  Demonstrated ejecti on fraction 20% and inferoposterior akinesis. There is moderate left atrial enlargement. There was no interval improvement in ejection fraction following convergent ablation. It was 20-25% 7/15  Intent at his last visit was to get him on a beta blocker therapy and uptitrated on his ACE inhibitor   Past Medical History  Diagnosis Date  . Ischemic cardiomyopathy 09/22/2008    DES CX and RCA  70% residual LAD  Done in [place]; EF 30%;; myoviewq 31-May-2006 no ischemia  . Implantable cardiac defibrillator MDT 09/22/2008    Change out feb 2011  . NEPHROLITHIASIS, HX OF 02/25/2008  . CALLUS, RIGHT FOOT 12/08/2009  . Chronic systolic heart failure 03/05/1094  . Persistent atrial fibrillation 11/25/2006    Inapp shock 05-31-2010 AF VR >220 recurrent  . HYPERLIPIDEMIA 11/25/2006  . HYPOGONADISM, MALE 12/05/2006  . 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 DIABETES MELLITUS, TYPE II 12/05/2006  . 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 Long term (current) use of anticoagulants 04/25/2010  . ICD (implantable cardiac defibrillator) in place     medtronic  . Myocardial infarction 2004/05/30    "I died and they brought me back"  . Kidney stones     Past Surgical History  Procedure Laterality Date  . Cataract extraction    . Tonsillectomy    . Cardiac defibrillator  placement      Guidant Vitality [name]  . Ptca    . Tonsillectomy    . Eye surgery      cataracts  . Cardiac catheterization      feburary May 30, 2012  . Cardioversion N/A 04/25/2012    Procedure: CARDIOVERSION;  Surgeon: Darlin Coco, MD;  Location: [facility];  Service: Cardiovascular;  Laterality: N/A;  . Cardioversion N/A 05/21/2012    Procedure: CARDIOVERSION;  Surgeon: Deboraha Sprang, MD;  Location: [facility] ENDOSCOPY;  Service: Cardiovascular;  Laterality: N/A;    Current Outpatient Prescriptions  Medication Sig Dispense Refill  . acetaminophen (TYLENOL) 325 MG tablet Take 650 mg by mouth as needed.    . carvedilol (COREG) 6.25 MG tablet Take 1 tablet (6.25 mg total) by mouth 2 (two) times daily. 180 tablet 3  . dofetilide (TIKOSYN) 500 MCG capsule Take 500 mcg by mouth 2 (two) times daily.    . furosemide (LASIX) 20 MG tablet TAKE ONE TABLET BY MOUTH EVERY DAY 90 tablet 0  . lisinopril (PRINIVIL,ZESTRIL) 2.5 MG tablet Take 1 tablet (2.5 mg total) by mouth daily. 30 tablet 3  . loratadine (CLARITIN) 10 MG tablet Take 10 mg by mouth as needed.     . Magnesium Oxide (MAG-OX 400 PO) Take 1 tablet by mouth 2 (two) times daily.    . metFORMIN (GLUCOPHAGE) 1000 MG tablet TAKE ONE TABLET BY MOUTH TWICE DAILY WITH MEALS 100 tablet 5  . nitroGLYCERIN (NITROSTAT) 0.4 MG SL tablet Place  0.4 mg under the tongue every 5 (five) minutes as needed. For chest pain    . Rivaroxaban (XARELTO) 20 MG TABS tablet Take 20 mg by mouth daily with supper.    . Tamsulosin HCl (FLOMAX) 0.4 MG CAPS Take 0.4 mg by mouth daily as needed.     . zolpidem (AMBIEN) 5 MG tablet Take 2 tablets (10 mg total) by mouth at bedtime as needed for sleep. 60 tablet 2   No current facility-administered medications for this visit.    Allergies  Allergen Reactions  . Salmon [Fish Allergy] Other (See Comments)    Only canned/ not fresh (probably preservatives).     Review of Systems negative except from HPI and PMH  Physical  Exam BP 122/72 mmHg  Pulse 76  Ht 5\' 10"  (1.778 m)  Wt 85.73 kg (189 lb)  BMI 27.12 kg/m2 Well developed and nourished in no acute distress HENT normal Neck supple with JVP-flat Clear Regular rate and rhythm, no murmurs or gallops Abd-soft with active BS No Clubbing cyanosis edema Skin-warm and dry A & Oriented  Grossly normal sensory and motor function\  ECG demonstrates atrial pacing at 81 Intervals 22/14/46  Assessment and  Plan  Atrial fibrillation s/p convergent ablation on dofetilide  Ischemic cardiomyopathy with three-vessel stenting and EF 20%  Congestive heart failure-chronic-systolic  Implantable defibrillator-Medtronic The patient's device was interrogated.  The information was reviewed. No changes were made in the programming.    Hypotension  Better  There is no significant symptoms of ischemia. Catheterization demonstrated patent grafts 1/14   He has had no intercurrent issues. He does continue to complain of fatigue. He would like to have  slppe study but is concerned about cost  His device is at  [facility]  We have reviewed the benefits and risks of generator replacement.  These include but are not limited to lead fracture and infection.  The patient understands, agrees and is willing to proceed.

## 2014-01-16 NOTE — Patient Instructions (Signed)
Your physician recommends that you continue on your current medications as directed. Please refer to the Current Medication list given to you today.  Your physician has recommended that you have a defibrillator generator change. Azizah Lisle, RN will call you to arrange this procedure.

## 2014-01-21 ENCOUNTER — Encounter: Payer: Self-pay | Admitting: Internal Medicine

## 2014-01-21 ENCOUNTER — Encounter: Payer: Self-pay | Admitting: *Deleted

## 2014-01-21 ENCOUNTER — Telehealth: Payer: Self-pay | Admitting: Internal Medicine

## 2014-01-21 NOTE — Telephone Encounter (Signed)
Scheduled ICD gen change for 12/9. Pre procedure labs 12/2. Reviewed letter of instructions with patient and left at front desk for him to pick up at lab appt. Wound check scheduled for 12/21. Patient verbalized understanding and agreeable to plan.

## 2014-01-21 NOTE — Telephone Encounter (Signed)
New message ° ° ° ° ° °Returning a nurses call °

## 2014-01-28 ENCOUNTER — Encounter: Payer: Medicare Other | Admitting: Internal Medicine

## 2014-01-29 ENCOUNTER — Other Ambulatory Visit (INDEPENDENT_AMBULATORY_CARE_PROVIDER_SITE_OTHER): Payer: Medicare Other | Admitting: *Deleted

## 2014-01-29 DIAGNOSIS — I255 Ischemic cardiomyopathy: Secondary | ICD-10-CM

## 2014-01-29 DIAGNOSIS — Z01812 Encounter for preprocedural laboratory examination: Secondary | ICD-10-CM

## 2014-01-29 DIAGNOSIS — Z4502 Encounter for adjustment and management of automatic implantable cardiac defibrillator: Secondary | ICD-10-CM

## 2014-01-29 LAB — CBC WITH DIFFERENTIAL/PLATELET
BASOS PCT: 0.6 % (ref 0.0–3.0)
Basophils Absolute: 0 10*3/uL (ref 0.0–0.1)
Eosinophils Absolute: 0.3 10*3/uL (ref 0.0–0.7)
Eosinophils Relative: 3.8 % (ref 0.0–5.0)
HCT: 43.8 % (ref 39.0–52.0)
HEMOGLOBIN: 14.8 g/dL (ref 13.0–17.0)
LYMPHS ABS: 1.4 10*3/uL (ref 0.7–4.0)
Lymphocytes Relative: 19.7 % (ref 12.0–46.0)
MCHC: 33.9 g/dL (ref 30.0–36.0)
MCV: 88.6 fl (ref 78.0–100.0)
MONOS PCT: 13.8 % — AB (ref 3.0–12.0)
Monocytes Absolute: 1 10*3/uL (ref 0.1–1.0)
NEUTROS ABS: 4.3 10*3/uL (ref 1.4–7.7)
Neutrophils Relative %: 62.1 % (ref 43.0–77.0)
Platelets: 177 10*3/uL (ref 150.0–400.0)
RBC: 4.94 Mil/uL (ref 4.22–5.81)
RDW: 12.9 % (ref 11.5–15.5)
WBC: 6.9 10*3/uL (ref 4.0–10.5)

## 2014-01-29 LAB — BASIC METABOLIC PANEL
BUN: 19 mg/dL (ref 6–23)
CHLORIDE: 97 meq/L (ref 96–112)
CO2: 23 mEq/L (ref 19–32)
Calcium: 8.9 mg/dL (ref 8.4–10.5)
Creatinine, Ser: 1.2 mg/dL (ref 0.4–1.5)
GFR: 63.11 mL/min (ref >60.00)
Glucose, Bld: 297 mg/dL — ABNORMAL HIGH (ref 70–99)
Potassium: 4.2 mEq/L (ref 3.5–5.1)
SODIUM: 129 meq/L — AB (ref 135–145)

## 2014-01-30 ENCOUNTER — Other Ambulatory Visit: Payer: Self-pay | Admitting: Internal Medicine

## 2014-02-03 ENCOUNTER — Telehealth: Payer: Self-pay | Admitting: Internal Medicine

## 2014-02-03 NOTE — Telephone Encounter (Signed)
Reviewed procedure instructions with patient (per his letter). Patient verbalized understanding and agreeable to plan.

## 2014-02-03 NOTE — Telephone Encounter (Signed)
New Message  Pt called for instructions for  Procedure on Wednesday for a device change pt says the instructions have not come in the mail. Please call back to discuss//sr

## 2014-02-03 NOTE — Telephone Encounter (Signed)
Patient in the middle of something. Told him I would call him back later - he thanks me.

## 2014-02-04 ENCOUNTER — Encounter: Payer: Self-pay | Admitting: Internal Medicine

## 2014-02-04 ENCOUNTER — Other Ambulatory Visit (INDEPENDENT_AMBULATORY_CARE_PROVIDER_SITE_OTHER): Payer: Medicare Other

## 2014-02-04 ENCOUNTER — Ambulatory Visit (INDEPENDENT_AMBULATORY_CARE_PROVIDER_SITE_OTHER): Payer: Medicare Other | Admitting: Internal Medicine

## 2014-02-04 VITALS — BP 110/60 | HR 81 | Temp 97.7°F | Ht 71.0 in | Wt 187.0 lb

## 2014-02-04 DIAGNOSIS — Z7901 Long term (current) use of anticoagulants: Secondary | ICD-10-CM

## 2014-02-04 DIAGNOSIS — E0842 Diabetes mellitus due to underlying condition with diabetic polyneuropathy: Secondary | ICD-10-CM

## 2014-02-04 DIAGNOSIS — G4700 Insomnia, unspecified: Secondary | ICD-10-CM

## 2014-02-04 DIAGNOSIS — J309 Allergic rhinitis, unspecified: Secondary | ICD-10-CM | POA: Insufficient documentation

## 2014-02-04 DIAGNOSIS — J3089 Other allergic rhinitis: Secondary | ICD-10-CM

## 2014-02-04 DIAGNOSIS — Z0000 Encounter for general adult medical examination without abnormal findings: Secondary | ICD-10-CM

## 2014-02-04 DIAGNOSIS — Z23 Encounter for immunization: Secondary | ICD-10-CM

## 2014-02-04 LAB — URINALYSIS, ROUTINE W REFLEX MICROSCOPIC
Bilirubin Urine: NEGATIVE
Hgb urine dipstick: NEGATIVE
Ketones, ur: NEGATIVE
Leukocytes, UA: NEGATIVE
Nitrite: NEGATIVE
RBC / HPF: NONE SEEN (ref 0–?)
SPECIFIC GRAVITY, URINE: 1.025 (ref 1.000–1.030)
Total Protein, Urine: NEGATIVE
URINE GLUCOSE: 500 — AB
Urobilinogen, UA: 0.2 (ref 0.0–1.0)
pH: 6 (ref 5.0–8.0)

## 2014-02-04 LAB — MICROALBUMIN / CREATININE URINE RATIO
Creatinine,U: 145.3 mg/dL
Microalb Creat Ratio: 3.2 mg/g (ref 0.0–30.0)
Microalb, Ur: 4.6 mg/dL — ABNORMAL HIGH (ref 0.0–1.9)

## 2014-02-04 LAB — LDL CHOLESTEROL, DIRECT: Direct LDL: 74 mg/dL

## 2014-02-04 LAB — LIPID PANEL
CHOL/HDL RATIO: 7
Cholesterol: 245 mg/dL — ABNORMAL HIGH (ref 0–200)
HDL: 33.8 mg/dL — ABNORMAL LOW (ref >39.00)
NonHDL: 211.2
Triglycerides: 940 mg/dL — ABNORMAL HIGH (ref 0.0–149.0)
VLDL: 188 mg/dL — AB (ref 0.0–40.0)

## 2014-02-04 LAB — HEMOGLOBIN A1C: Hgb A1c MFr Bld: 10.1 % — ABNORMAL HIGH (ref 4.6–6.5)

## 2014-02-04 LAB — TSH: TSH: 1.65 u[IU]/mL (ref 0.35–4.50)

## 2014-02-04 LAB — PSA: PSA: 0.7 ng/mL (ref 0.10–4.00)

## 2014-02-04 MED ORDER — SODIUM CHLORIDE 0.9 % IV SOLN
INTRAVENOUS | Status: DC
Start: 2014-02-04 — End: 2014-02-04

## 2014-02-04 MED ORDER — CEFAZOLIN SODIUM-DEXTROSE 2-3 GM-% IV SOLR: 2.0000 g | INTRAVENOUS | Status: DC

## 2014-02-04 MED ORDER — FLUTICASONE PROPIONATE 50 MCG/ACT NA SUSP: 2.0000 | Freq: Every day | NASAL | Status: DC

## 2014-02-04 MED ORDER — GENTAMICIN SULFATE 40 MG/ML IJ SOLN
80.0000 mg | INTRAMUSCULAR | Status: DC
  Filled 2014-02-04 (×2): qty 2

## 2014-02-04 MED ORDER — CHLORHEXIDINE GLUCONATE 4 % EX LIQD
60.0000 mL | Freq: Once | CUTANEOUS | Status: DC
  Filled 2014-02-04: qty 60

## 2014-02-04 MED ORDER — ZOLPIDEM TARTRATE 5 MG PO TABS: 10.0000 mg | ORAL_TABLET | Freq: Every evening | ORAL | Status: DC | PRN

## 2014-02-04 NOTE — Assessment & Plan Note (Signed)
Ok for ambien refill 

## 2014-02-04 NOTE — Progress Notes (Signed)
Subjective:    Patient ID: [name], male    DOB: 21-Jun-1945, 68 y.o.   MRN: [id number]  HPI  Here for wellness and f/u;  Overall doing ok;  Pt denies CP, worsening SOB, DOE, wheezing, orthopnea, PND, worsening LE edema, palpitations, dizziness or syncope.  Pt denies neurological change such as new headache, facial or extremity weakness.  Pt denies polydipsia, polyuria, or low sugar symptoms. Pt states overall good compliance with treatment and medications, good tolerability, and has been trying to follow lower cholesterol diet.  Pt denies worsening depressive symptoms, suicidal ideation or panic. No fever, night sweats, wt loss, loss of appetite, or other constitutional symptoms.  Pt states good ability with ADL's, has low fall risk, home safety reviewed and adequate, no other significant changes in hearing or vision, and only occasionally active with exercise. Has Less energy, sleeping more recently, has taken med before but sensitvie to meds.  On xarelto x 1 yr, off coumadin after ablation cardiac.  Does have several wks ongoing nasal allergy symptoms with clearish congestion, itch and sneezing, without fever, pain, ST, cough, swelling or wheezing. Does c/o ongoing fatigue, but denies signficant daytime hypersomnolence.  Is rx 2 metformin per day, but only taking one pill due to ? Of relation to bowel urgency. . Past Medical History  Diagnosis Date  . Ischemic cardiomyopathy 09/22/2008    DES CX and RCA  70% residual LAD  Done in [place]; EF 30%;; myoviewq 04-Jun-2006 no ischemia  . Implantable cardiac defibrillator MDT 09/22/2008    Change out feb 2011  . NEPHROLITHIASIS, HX OF 02/25/2008  . CALLUS, RIGHT FOOT 12/08/2009  . Chronic systolic heart failure 09/02/2261  . Persistent atrial fibrillation 11/25/2006    Inapp shock 2010/06/04 AF VR >220 recurrent  . HYPERLIPIDEMIA 11/25/2006  . HYPOGONADISM, MALE 12/05/2006  . 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 DIABETES MELLITUS, TYPE II 12/05/2006  .  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 Long term (current) use of anticoagulants 04/25/2010  . ICD (implantable cardiac defibrillator) in place     medtronic  . Myocardial infarction 2004/06/03    "I died and they brought me back"  . Kidney stones    Past Surgical History  Procedure Laterality Date  . Cataract extraction    . Tonsillectomy    . Cardiac defibrillator placement      Guidant Vitality [name]  . Ptca    . Tonsillectomy    . Eye surgery      cataracts  . Cardiac catheterization      feburary 03-Jun-2012  . Cardioversion N/A 04/25/2012    Procedure: CARDIOVERSION;  Surgeon: Darlin Coco, MD;  Location: [facility] ENDOSCOPY;  Service: Cardiovascular;  Laterality: N/A;  . Cardioversion N/A 05/21/2012    Procedure: CARDIOVERSION;  Surgeon: Deboraha Sprang, MD;  Location: [facility] ENDOSCOPY;  Service: Cardiovascular;  Laterality: N/A;    reports that he has never smoked. He has never used smokeless tobacco. He reports that he drinks alcohol. He reports that he does not use illicit drugs. family history includes Breast cancer in his mother; Diabetes in his maternal grandmother; Hyperlipidemia in his maternal grandmother. There is no history of Cancer, COPD, or Heart disease. Allergies  Allergen Reactions  . Salmon [Fish Allergy] Other (See Comments)    Only canned/ not fresh (probably preservatives).    Current Outpatient Prescriptions on File Prior to Visit  Medication Sig Dispense Refill  . acetaminophen (TYLENOL) 325 MG tablet Take 650 mg by  mouth as needed.    . Ascorbic Acid (VITAMIN C PO) Take 1 tablet by mouth daily.    . carvedilol (COREG) 6.25 MG tablet Take 1 tablet (6.25 mg total) by mouth 2 (two) times daily. 180 tablet 3  . dofetilide (TIKOSYN) 500 MCG capsule Take 500 mcg by mouth 2 (two) times daily.    . furosemide (LASIX) 20 MG tablet TAKE ONE TABLET BY MOUTH EVERY DAY 90 tablet 0  . lisinopril (PRINIVIL,ZESTRIL) 2.5 MG tablet Take 1 tablet (2.5 mg total) by mouth daily.  30 tablet 3  . loratadine (CLARITIN) 10 MG tablet Take 10 mg by mouth as needed for allergies.     . Magnesium Oxide (MAG-OX 400 PO) Take 1 tablet by mouth 2 (two) times daily.    . metFORMIN (GLUCOPHAGE) 1000 MG tablet TAKE ONE TABLET BY MOUTH TWICE DAILY WITH MEALS 100 tablet 5  . metFORMIN (GLUCOPHAGE) 1000 MG tablet Take 1,000 mg by mouth daily with breakfast.    . Multiple Vitamins-Minerals (MULTIVITAMIN PO) Take 1 tablet by mouth daily.    . nitroGLYCERIN (NITROSTAT) 0.4 MG SL tablet Place 0.4 mg under the tongue every 5 (five) minutes as needed for chest pain.     . Rivaroxaban (XARELTO) 20 MG TABS tablet Take 20 mg by mouth daily with supper.    . Tamsulosin HCl (FLOMAX) 0.4 MG CAPS Take 0.4 mg by mouth daily as needed (kidney stones).      Current Facility-Administered Medications on File Prior to Visit  Medication Dose Route Frequency Provider Last Rate Last Dose  . 0.9 %  sodium chloride infusion   Intravenous Continuous Deboraha Sprang, MD      . ceFAZolin (ANCEF) IVPB 2 g/50 mL premix  2 g Intravenous On Call Deboraha Sprang, MD      . chlorhexidine (HIBICLENS) 4 % liquid 4 application  60 mL Topical Once Deboraha Sprang, MD      . gentamicin (GARAMYCIN) 80 mg in sodium chloride irrigation 0.9 % 500 mL irrigation  80 mg Irrigation On Call Deboraha Sprang, MD        Review of Systems Constitutional: Negative for increased diaphoresis, other activity, appetite or other siginficant weight change  HENT: Negative for worsening hearing loss, ear pain, facial swelling, mouth sores and neck stiffness.   Eyes: Negative for other worsening pain, redness or visual disturbance.  Respiratory: Negative for shortness of breath and wheezing.   Cardiovascular: Negative for chest pain and palpitations.  Gastrointestinal: Negative for diarrhea, blood in stool, abdominal distention or other pain Genitourinary: Negative for hematuria, flank pain or change in urine volume.  Musculoskeletal: Negative for  myalgias or other joint complaints.  Skin: Negative for color change and wound.  Neurological: Negative for syncope and numbness. other than noted Hematological: Negative for adenopathy. or other swelling Psychiatric/Behavioral: Negative for hallucinations, self-injury, decreased concentration or other worsening agitation.      Objective:   Physical Exam BP 110/60 mmHg  Pulse 81  Temp(Src) 97.7 F (36.5 C) (Oral)  Ht 5\' 11"  (1.803 m)  Wt 187 lb (84.823 kg)  BMI 26.09 kg/m2  SpO2 95% VS noted,  Constitutional: Pt is oriented to person, place, and time. Appears well-developed and well-nourished.  Head: Normocephalic and atraumatic.  Right Ear: External ear normal.  Left Ear: External ear normal.  Nose: Nose normal.  Mouth/Throat: Oropharynx is clear and moist.  Bilat tm's with mild erythema.  Max sinus areas non tender.  Pharynx with mild  erythema, no exudate Eyes: Conjunctivae and EOM are normal. Pupils are equal, round, and reactive to light.  Neck: Normal range of motion. Neck supple. No JVD present. No tracheal deviation present.  Cardiovascular: Normal rate, regular rhythm, normal heart sounds and intact distal pulses.   Pulmonary/Chest: Effort normal and breath sounds without rales or wheezing  Abdominal: Soft. Bowel sounds are normal. NT. No HSM  Musculoskeletal: Normal range of motion. Exhibits no edema.  Lymphadenopathy:  Has no cervical adenopathy.  Neurological: Pt is alert and oriented to person, place, and time. Pt has normal reflexes. No cranial nerve deficit. Motor grossly intact Skin: Skin is warm and dry. No rash noted.  Psychiatric:  Has normal mood and affect. Behavior is normal.     Assessment & Plan:

## 2014-02-04 NOTE — Assessment & Plan Note (Signed)
For flonase asd,  to f/u any worsening symptoms or concerns

## 2014-02-04 NOTE — Patient Instructions (Addendum)
You had the new Prevnar pneumonia shot today  Please take all new medication as prescribed - the flonase for allergies  Please continue all other medications as before, and refills have been done if requested - the ambien  Please have the pharmacy call with any other refills you may need.  Please continue your efforts at being more active, low cholesterol diet, and weight control.  You are otherwise up to date with prevention measures today.  Please keep your appointments with your specialists as you may have planned  You will be contacted regarding the referral for: Gastroenterology to inquire about possible screening colonoscopy  Please contact the [employer] to check on copay for the shingles shot; if covered, please make a Nurse Visit for the shot to be done  Please go to the LAB in the Basement (turn left off the elevator) for the tests to be done today  You will be contacted by phone if any changes need to be made immediately.  Otherwise, you will receive a letter about your results with an explanation, but please check with MyChart first.  Please return in 6 months, or sooner if needed, with Lab testing done 3-5 days before

## 2014-02-04 NOTE — Progress Notes (Signed)
Pre visit review using our clinic review tool, if applicable. No additional management support is needed unless otherwise documented below in the visit note. 

## 2014-02-04 NOTE — Assessment & Plan Note (Signed)
stable overall by history and exam, recent data reviewed with pt, and pt to continue medical treatment as before,  to f/u any worsening symptoms or concerns Lab Results  Component Value Date   HGBA1C 7.2* 04/11/2011

## 2014-02-04 NOTE — Addendum Note (Signed)
Addended by: Julieta Bellini on: 02/04/2014 02:43 PM   Modules accepted: Orders

## 2014-02-04 NOTE — Assessment & Plan Note (Signed)
Pt on xarelto, will need refer GI for consideration for screening colonscopy (none done before per pt)

## 2014-02-04 NOTE — Assessment & Plan Note (Signed)

## 2014-02-05 ENCOUNTER — Encounter (HOSPITAL_COMMUNITY)
Admission: RE | Disposition: A | Discharge status: Home / Self Care | Payer: Self-pay | Source: Ambulatory Visit | Attending: Internal Medicine

## 2014-02-05 ENCOUNTER — Other Ambulatory Visit: Payer: Self-pay | Admitting: *Deleted

## 2014-02-05 ENCOUNTER — Ambulatory Visit (HOSPITAL_COMMUNITY)
Admission: RE | Admit: 2014-02-05 | Discharge: 2014-02-05 | Disposition: A | Discharge status: Home / Self Care | Payer: Medicare Other | Source: Ambulatory Visit | Attending: Internal Medicine | Admitting: Internal Medicine

## 2014-02-05 DIAGNOSIS — Z95810 Presence of automatic (implantable) cardiac defibrillator: Secondary | ICD-10-CM | POA: Insufficient documentation

## 2014-02-05 DIAGNOSIS — I252 Old myocardial infarction: Secondary | ICD-10-CM | POA: Diagnosis not present

## 2014-02-05 DIAGNOSIS — I480 Paroxysmal atrial fibrillation: Secondary | ICD-10-CM | POA: Insufficient documentation

## 2014-02-05 DIAGNOSIS — I255 Ischemic cardiomyopathy: Secondary | ICD-10-CM | POA: Diagnosis not present

## 2014-02-05 DIAGNOSIS — E1140 Type 2 diabetes mellitus with diabetic neuropathy, unspecified: Secondary | ICD-10-CM | POA: Insufficient documentation

## 2014-02-05 DIAGNOSIS — Z7901 Long term (current) use of anticoagulants: Secondary | ICD-10-CM | POA: Diagnosis not present

## 2014-02-05 DIAGNOSIS — I469 Cardiac arrest, cause unspecified: Secondary | ICD-10-CM

## 2014-02-05 DIAGNOSIS — I5022 Chronic systolic (congestive) heart failure: Secondary | ICD-10-CM | POA: Diagnosis not present

## 2014-02-05 DIAGNOSIS — I447 Left bundle-branch block, unspecified: Secondary | ICD-10-CM | POA: Diagnosis not present

## 2014-02-05 DIAGNOSIS — Z4502 Encounter for adjustment and management of automatic implantable cardiac defibrillator: Secondary | ICD-10-CM

## 2014-02-05 DIAGNOSIS — I4901 Ventricular fibrillation: Secondary | ICD-10-CM | POA: Diagnosis present

## 2014-02-05 DIAGNOSIS — E785 Hyperlipidemia, unspecified: Secondary | ICD-10-CM | POA: Diagnosis not present

## 2014-02-05 DIAGNOSIS — T82110A Breakdown (mechanical) of cardiac electrode, initial encounter: Secondary | ICD-10-CM

## 2014-02-05 HISTORY — PX: IMPLANTABLE CARDIOVERTER DEFIBRILLATOR (ICD) GENERATOR CHANGE: SHX5469

## 2014-02-05 LAB — SURGICAL PCR SCREEN
MRSA, PCR: NEGATIVE
STAPHYLOCOCCUS AUREUS: NEGATIVE

## 2014-02-05 LAB — GLUCOSE, CAPILLARY: Glucose-Capillary: 271 mg/dL — ABNORMAL HIGH (ref 70–99)

## 2014-02-05 SURGERY — ICD GENERATOR CHANGE
Anesthesia: LOCAL

## 2014-02-05 MED ORDER — LIDOCAINE HCL (PF) 1 % IJ SOLN
INTRAMUSCULAR | Status: AC
  Filled 2014-02-05: qty 60

## 2014-02-05 MED ORDER — FENTANYL CITRATE 0.05 MG/ML IJ SOLN
INTRAMUSCULAR | Status: AC
  Filled 2014-02-05: qty 2

## 2014-02-05 MED ORDER — CEFAZOLIN SODIUM-DEXTROSE 2-3 GM-% IV SOLR
INTRAVENOUS | Status: AC
  Filled 2014-02-05: qty 50

## 2014-02-05 MED ORDER — MUPIROCIN 2 % EX OINT
TOPICAL_OINTMENT | CUTANEOUS | Status: AC
  Administered 2014-02-05: 1 via TOPICAL
  Filled 2014-02-05: qty 22

## 2014-02-05 MED ORDER — MIDAZOLAM HCL 5 MG/5ML IJ SOLN
INTRAMUSCULAR | Status: AC
  Filled 2014-02-05: qty 5

## 2014-02-05 MED ORDER — SODIUM CHLORIDE 0.9 % IV SOLN: INTRAVENOUS | Status: AC

## 2014-02-05 MED ORDER — ONDANSETRON HCL 4 MG/2ML IJ SOLN
4.0000 mg | Freq: Four times a day (QID) | INTRAMUSCULAR | Status: DC | PRN
Start: 2014-02-05 — End: 2014-02-05

## 2014-02-05 MED ORDER — ACETAMINOPHEN 325 MG PO TABS
325.0000 mg | ORAL_TABLET | ORAL | Status: DC | PRN
  Filled 2014-02-05: qty 2

## 2014-02-05 MED ORDER — METFORMIN HCL 1000 MG PO TABS: 1000.0000 mg | ORAL_TABLET | Freq: Every day | ORAL | Status: DC

## 2014-02-05 MED ORDER — MUPIROCIN 2 % EX OINT
1.0000 "application " | TOPICAL_OINTMENT | Freq: Once | CUTANEOUS | Status: AC
  Administered 2014-02-05: 1 via TOPICAL
  Filled 2014-02-05: qty 22

## 2014-02-05 NOTE — Interval H&P Note (Signed)
ICD Criteria  Current LVEF:20% ;Obtained > 6 months ago.   NYHA Functional Classification: Class II  Heart Failure History:  Yes, Duration of heart failure since onset is > 9 months  Non-Ischemic Dilated Cardiomyopathy History:  No.  Atrial Fibrillation/Atrial Flutter:  Yes, A-Fib/A-Flutter type: Paroxysmal.  Ventricular Tachycardia History:  Yes, Hemodynamic instability present, VT Type:  SVT - Polymorphic.  Cardiac Arrest History:  Yes, This was a Ventricular Tachycardia/Ventricular Fibrillation Arrest. This was NOT a bradycardia arrest.  History of Syndromes with Risk of Sudden Death:  No.  Previous ICD:  Yes, ICD Type:  Dual, Reason for ICD:  Secondary, reason for secondary prevention:  Cardiac Arrest.  Electrophysiology Study: No.  Prior MI: Yes, Most recent MI timeframe is > 40 days.  PPM: No.  OSA:  No  Patient Life Expectancy of >=1 year: Yes.  Anticoagulation Therapy:  Patient is on anticoagulation therapy, anticoagulation was held prior to procedure.   Beta Blocker Therapy:  Yes.   Ace Inhibitor/ARB Therapy:  Yes.History and Physical Interval Note:  02/05/2014 8:02 AM  [name]  has presented today for surgery, with the diagnosis of ERI  The various methods of treatment have been discussed with the patient and family. After consideration of risks, benefits and other options for treatment, the patient has consented to  Procedure(s): ICD GENERATOR CHANGE (N/A) as a surgical intervention .  The patient's history has been reviewed, patient examined, no change in status, stable for surgery.  I have reviewed the patient's chart and labs.  Questions were answered to the patient's satisfaction.     [name]

## 2014-02-05 NOTE — Interval H&P Note (Signed)
History and Physical Interval Note:  02/05/2014 8:16 AM  [name]  has presented today for surgery, with the diagnosis of ERI  The various methods of treatment have been discussed with the patient and family. After consideration of risks, benefits and other options for treatment, the patient has consented to  Procedure(s): ICD GENERATOR CHANGE (N/A) as a surgical intervention .  The patient's history has been reviewed, patient examined, no change in status, stable for surgery.  I have reviewed the patient's chart and labs.  Questions were answered to the patient's satisfaction.     [place] notable for TG >900  Will be in touch with PCP and anticipate the intriduction of statin which I have mentioned to patient this am

## 2014-02-05 NOTE — CV Procedure (Signed)
Preoperative diagnosis  Aborted cardiac arrest  Sinus node dysfunction device at [facility]  chf AND ivcd  But not sufficient to justify LV lead insertion Postoperative diagnosis same/, violation of insulation of the HV lead  Procedure: Generator replacement; Assessment of HV leads  pcket revision, lead repair AEGIS antimicrobial pouch  Following informed consent the patient was brought to the electrophysiology laboratory in place of the fluoroscopic table in the supine position after routine prep and drape lidocaine was infiltrated in the region of the previous incision and carried down to later the device pocket using sharp dissection and electrocautery. The pocket was opened the device was freed up and was explanted.  Interrogation of the previously implanted ICD ventricular lead [employer]  demonstrated an R wave of 27  millivolts., and impedance of 600 ohms, and a pacing threshold of 0.7 volts at 0.5 msec.  The previously implanted atrial lead Medtronic [id number] demonstrated a P-wave amplitude of  6 milllivolts and impedance of  535 ohms, and a pacing threshold of  0.5 volts at @ 0.52milliseconds.  The leads were inspected. Repair was needed of the high voltage lead. The leads were then attached to a Medtronic  pulse generator, serial number [id number].    Through the device the P-wave amplitude  Was  4.6 milllivolts and impedance of  475 ohms, and a pacing threshold of  0.75 volts at @ 0.5milliseconds; the ICD ventricular lead demonstrated an R wave of  20  millivolts., and impedance of 456 ohms, and a pacing threshold of  0.75 volts at 0.4 msec   High voltage impedances were 67./51 ohms  The pocket was revised to allow for placing of the generator and AEGIS pouch ; the posterior was was excised  The device was secured to the prepectoral pocket Surgicell was placed in the pocket  The pocket was irrigated with antibiotic containing saline solution hemostasis was assured and the leads and the  device were placed in the pocket. The wound was then closed in 2 layers in normal fashion.  The patient tolerated the procedure without apparent complication.  DFT testing was not performed  EBL Minimal   [name]   \

## 2014-02-05 NOTE — H&P (View-Only) (Signed)
Patient Care Team: Neena Rhymes, MD as PCP - General   HPI  [name] is a 68 y.o. male Seen in followup for aborted cardiac arrest, atrial fibrillation and most recently an episode of syncope this occurs in the context of  ischemic cardiomyopathy with prior stenting of his LAD circumflex and RCA. Catheterization 1/14 demonstrated no obstructive and and patent grafts. He is status post ICD implantation. His device now is at [facility]     He subsequently underwent convergent ablation at [facility]. He management post ablation dofetilide.  He has ischemic heart disease with prior stenting  Echocardiogram 8/14  Demonstrated ejecti on fraction 20% and inferoposterior akinesis. There is moderate left atrial enlargement. There was no interval improvement in ejection fraction following convergent ablation. It was 20-25% 7/15  Intent at his last visit was to get him on a beta blocker therapy and uptitrated on his ACE inhibitor   Past Medical History  Diagnosis Date  . Ischemic cardiomyopathy 09/22/2008    DES CX and RCA  70% residual LAD  Done in [place]S; EF 30%;; myoviewq June 27, 2006 no ischemia  . Implantable cardiac defibrillator MDT 09/22/2008    Change out feb 2011  . NEPHROLITHIASIS, HX OF 02/25/2008  . CALLUS, RIGHT FOOT 12/08/2009  . Chronic systolic heart failure 08/04/5991  . Persistent atrial fibrillation 11/25/2006    Inapp shock 06-27-10 AF VR >220 recurrent  . HYPERLIPIDEMIA 11/25/2006  . HYPOGONADISM, MALE 12/05/2006  . 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 DIABETES MELLITUS, TYPE II 12/05/2006  . 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 Long term (current) use of anticoagulants 04/25/2010  . ICD (implantable cardiac defibrillator) in place     medtronic  . Myocardial infarction June 26, 2004    "I died and they brought me back"  . Kidney stones     Past Surgical History  Procedure Laterality Date  . Cataract extraction    . Tonsillectomy    . Cardiac defibrillator  placement      Guidant Vitality [name]  . Ptca    . Tonsillectomy    . Eye surgery      cataracts  . Cardiac catheterization      feburary 06/26/2012  . Cardioversion N/A 04/25/2012    Procedure: CARDIOVERSION;  Surgeon: Darlin Coco, MD;  Location: [place];  Service: Cardiovascular;  Laterality: N/A;  . Cardioversion N/A 05/21/2012    Procedure: CARDIOVERSION;  Surgeon: Deboraha Sprang, MD;  Location: [facility] ENDOSCOPY;  Service: Cardiovascular;  Laterality: N/A;    Current Outpatient Prescriptions  Medication Sig Dispense Refill  . acetaminophen (TYLENOL) 325 MG tablet Take 650 mg by mouth as needed.    . carvedilol (COREG) 6.25 MG tablet Take 1 tablet (6.25 mg total) by mouth 2 (two) times daily. 180 tablet 3  . dofetilide (TIKOSYN) 500 MCG capsule Take 500 mcg by mouth 2 (two) times daily.    . furosemide (LASIX) 20 MG tablet TAKE ONE TABLET BY MOUTH EVERY DAY 90 tablet 0  . lisinopril (PRINIVIL,ZESTRIL) 2.5 MG tablet Take 1 tablet (2.5 mg total) by mouth daily. 30 tablet 3  . loratadine (CLARITIN) 10 MG tablet Take 10 mg by mouth as needed.     . Magnesium Oxide (MAG-OX 400 PO) Take 1 tablet by mouth 2 (two) times daily.    . metFORMIN (GLUCOPHAGE) 1000 MG tablet TAKE ONE TABLET BY MOUTH TWICE DAILY WITH MEALS 100 tablet 5  . nitroGLYCERIN (NITROSTAT) 0.4 MG SL tablet Place  0.4 mg under the tongue every 5 (five) minutes as needed. For chest pain    . Rivaroxaban (XARELTO) 20 MG TABS tablet Take 20 mg by mouth daily with supper.    . Tamsulosin HCl (FLOMAX) 0.4 MG CAPS Take 0.4 mg by mouth daily as needed.     . zolpidem (AMBIEN) 5 MG tablet Take 2 tablets (10 mg total) by mouth at bedtime as needed for sleep. 60 tablet 2   No current facility-administered medications for this visit.    Allergies  Allergen Reactions  . Salmon [Fish Allergy] Other (See Comments)    Only canned/ not fresh (probably preservatives).     Review of Systems negative except from HPI and PMH  Physical  Exam BP 122/72 mmHg  Pulse 76  Ht 5\' 10"  (1.778 m)  Wt 85.73 kg (189 lb)  BMI 27.12 kg/m2 Well developed and nourished in no acute distress HENT normal Neck supple with JVP-flat Clear Regular rate and rhythm, no murmurs or gallops Abd-soft with active BS No Clubbing cyanosis edema Skin-warm and dry A & Oriented  Grossly normal sensory and motor function\  ECG demonstrates atrial pacing at 81 Intervals 22/14/46  Assessment and  Plan  Atrial fibrillation s/p convergent ablation on dofetilide  Ischemic cardiomyopathy with three-vessel stenting and EF 20%  Congestive heart failure-chronic-systolic  Implantable defibrillator-Medtronic The patient's device was interrogated.  The information was reviewed. No changes were made in the programming.    Hypotension  Better  There is no significant symptoms of ischemia. Catheterization demonstrated patent grafts 1/14   He has had no intercurrent issues. He does continue to complain of fatigue. He would like to have  slppe study but is concerned about cost  His device is at  [facility]  We have reviewed the benefits and risks of generator replacement.  These include but are not limited to lead fracture and infection.  The patient understands, agrees and is willing to proceed.

## 2014-02-05 NOTE — Discharge Instructions (Signed)
Pacemaker Battery Change, Care After °Refer to this sheet in the next few weeks. These instructions provide you with information on caring for yourself after your procedure. Your health care provider may also give you more specific instructions. Your treatment has been planned according to current medical practices, but problems sometimes occur. Call your health care provider if you have any problems or questions after your procedure. °WHAT TO EXPECT AFTER THE PROCEDURE °After your procedure, it is typical to have the following sensations: °· Soreness at the pacemaker site. °HOME CARE INSTRUCTIONS  °· Keep the incision clean and dry. °· Unless advised otherwise, you may shower beginning 48 hours after your procedure. °· For the first week after the replacement, avoid stretching motions that pull at the incision site, and avoid heavy exercise with the arm that is on the same side as the incision. °· Take medicines only as directed by your health care provider. °· Keep all follow-up visits as directed by your health care provider. °SEEK MEDICAL CARE IF:  °· You have pain at the incision site that is not relieved by over-the-counter or prescription medicine. °· There is drainage or pus from the incision site. °· There is swelling larger than a lime at the incision site. °· You develop red streaking that extends above or below the incision site. °· You feel brief, intermittent palpitations, light-headedness, or any symptoms that you feel might be related to your heart. °SEEK IMMEDIATE MEDICAL CARE IF:  °· You experience chest pain that is different than the pain at the pacemaker site. °· You experience shortness of breath. °· You have palpitations or irregular heartbeat. °· You have light-headedness that does not go away quickly. °· You faint. °· You have pain that gets worse and is not relieved by medicine. °Document Released: 12/05/2012 Document Revised: 07/01/2013 Document Reviewed: 12/05/2012 °Exit[employer]® Patient  Information ©2015 [employer]. This information is not intended to replace advice given to you by your health care provider. Make sure you discuss any questions you have with your health care provider. ° °

## 2014-02-06 ENCOUNTER — Encounter (HOSPITAL_COMMUNITY): Payer: Self-pay | Admitting: Internal Medicine

## 2014-02-07 ENCOUNTER — Ambulatory Visit (INDEPENDENT_AMBULATORY_CARE_PROVIDER_SITE_OTHER): Payer: Medicare Other | Admitting: Internal Medicine

## 2014-02-07 ENCOUNTER — Encounter: Payer: Self-pay | Admitting: Internal Medicine

## 2014-02-07 VITALS — BP 132/76 | HR 77 | Temp 97.7°F | Ht 70.0 in | Wt 190.0 lb

## 2014-02-07 DIAGNOSIS — F329 Major depressive disorder, single episode, unspecified: Secondary | ICD-10-CM

## 2014-02-07 DIAGNOSIS — F32A Depression, unspecified: Secondary | ICD-10-CM

## 2014-02-07 DIAGNOSIS — E119 Type 2 diabetes mellitus without complications: Secondary | ICD-10-CM

## 2014-02-07 DIAGNOSIS — E785 Hyperlipidemia, unspecified: Secondary | ICD-10-CM

## 2014-02-07 DIAGNOSIS — E0842 Diabetes mellitus due to underlying condition with diabetic polyneuropathy: Secondary | ICD-10-CM

## 2014-02-07 LAB — GLUCOSE, POCT (MANUAL RESULT ENTRY): POC GLUCOSE: 198 mg/dL — AB (ref 70–99)

## 2014-02-07 MED ORDER — GLUCOSE BLOOD VI STRP: ORAL_STRIP | Status: DC

## 2014-02-07 MED ORDER — LANCETS MISC: Status: AC

## 2014-02-07 MED ORDER — GLIPIZIDE ER 5 MG PO TB24: 5.0000 mg | ORAL_TABLET | Freq: Every day | ORAL | Status: DC

## 2014-02-07 MED ORDER — ONETOUCH ULTRA SYSTEM W/DEVICE KIT: 1.0000 | PACK | Freq: Once | Status: DC

## 2014-02-07 NOTE — Progress Notes (Signed)
Subjective:    Patient ID: [name], male    DOB: 1945-10-23, 68 y.o.   MRN: [id number]  HPI  Here to f/u DM  - Pt denies chest pain, increased sob or doe, wheezing, orthopnea, PND, increased LE swelling, palpitations, dizziness or syncope.   Pt denies polydipsia, polyuria, or low sugar symptoms such as weakness or confusion improved with po intake.  Pt states overall good compliance with meds, except can only take limit 1000 mg metformin per day due to GI distress and loose stools.  He is adamant he can also do better with diet and activity, hesitant to consider insulin at this time.  Denies worsening depressive symptoms, suicidal ideation, or panic.  Pt denies new neurological symptoms such as new headache, or facial or extremity weakness or numbness  Past Medical History  Diagnosis Date  . Ischemic cardiomyopathy 09/22/2008    DES CX and RCA  70% residual LAD  Done in [place]; EF 30%;; myoviewq 2006/06/09 no ischemia  . Implantable cardiac defibrillator MDT 09/22/2008    Change out feb 2011  . NEPHROLITHIASIS, HX OF 02/25/2008  . CALLUS, RIGHT FOOT 12/08/2009  . Chronic systolic heart failure 0/0/9233  . Persistent atrial fibrillation 11/25/2006    Inapp shock 2010/06/09 AF VR >220 recurrent  . HYPERLIPIDEMIA 11/25/2006  . HYPOGONADISM, MALE 12/05/2006  . 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 DIABETES MELLITUS, TYPE II 12/05/2006  . 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 Long term (current) use of anticoagulants 04/25/2010  . ICD (implantable cardiac defibrillator) in place     medtronic  . Myocardial infarction June 08, 2004    "I died and they brought me back"  . Kidney stones    Past Surgical History  Procedure Laterality Date  . Cataract extraction    . Tonsillectomy    . Cardiac defibrillator placement      Guidant Vitality [name]  . Ptca    . Tonsillectomy    . Eye surgery      cataracts  . Cardiac catheterization      feburary 2012/06/08  . Cardioversion N/A 04/25/2012   Procedure: CARDIOVERSION;  Surgeon: Darlin Coco, MD;  Location: [facility] ENDOSCOPY;  Service: Cardiovascular;  Laterality: N/A;  . Cardioversion N/A 05/21/2012    Procedure: CARDIOVERSION;  Surgeon: Deboraha Sprang, MD;  Location: [place];  Service: Cardiovascular;  Laterality: N/A;  . Implantable cardioverter defibrillator (icd) generator change N/A 02/05/2014    Procedure: ICD GENERATOR CHANGE;  Surgeon: Deboraha Sprang, MD;  Location: [facility] CATH LAB;  Service: Cardiovascular;  Laterality: N/A;    reports that he has never smoked. He has never used smokeless tobacco. He reports that he drinks alcohol. He reports that he does not use illicit drugs. family history includes Breast cancer in his mother; Diabetes in his maternal grandmother; Hyperlipidemia in his maternal grandmother. There is no history of Cancer, COPD, or Heart disease. Allergies  Allergen Reactions  . Salmon [Fish Allergy] Other (See Comments)    Only canned/ not fresh (probably preservatives).    Current Outpatient Prescriptions on File Prior to Visit  Medication Sig Dispense Refill  . acetaminophen (TYLENOL) 325 MG tablet Take 650 mg by mouth as needed.    . Ascorbic Acid (VITAMIN C PO) Take 1 tablet by mouth daily.    . carvedilol (COREG) 6.25 MG tablet Take 1 tablet (6.25 mg total) by mouth 2 (two) times daily. 180 tablet 3  . dofetilide (TIKOSYN) 500 MCG capsule Take 500 mcg  by mouth 2 (two) times daily.    . fluticasone (FLONASE) 50 MCG/ACT nasal spray Place 2 sprays into both nostrils daily. 16 g 5  . furosemide (LASIX) 20 MG tablet TAKE ONE TABLET BY MOUTH EVERY DAY 90 tablet 0  . lisinopril (PRINIVIL,ZESTRIL) 2.5 MG tablet Take 1 tablet (2.5 mg total) by mouth daily. 30 tablet 3  . loratadine (CLARITIN) 10 MG tablet Take 10 mg by mouth as needed for allergies.     . Magnesium Oxide (MAG-OX 400 PO) Take 1 tablet by mouth 2 (two) times daily.    . metFORMIN (GLUCOPHAGE) 1000 MG tablet Take 1 tablet (1,000 mg total) by  mouth daily with breakfast. 90 tablet 3  . Multiple Vitamins-Minerals (MULTIVITAMIN PO) Take 1 tablet by mouth daily.    . nitroGLYCERIN (NITROSTAT) 0.4 MG SL tablet Place 0.4 mg under the tongue every 5 (five) minutes as needed for chest pain.     . Rivaroxaban (XARELTO) 20 MG TABS tablet Take 20 mg by mouth daily with supper.    . Tamsulosin HCl (FLOMAX) 0.4 MG CAPS Take 0.4 mg by mouth daily as needed (kidney stones).     . zolpidem (AMBIEN) 5 MG tablet Take 2 tablets (10 mg total) by mouth at bedtime as needed for sleep. 90 tablet 1   No current facility-administered medications on file prior to visit.   Review of Systems  Constitutional: Negative for unusual diaphoresis or other sweats  HENT: Negative for ringing in ear Eyes: Negative for double vision or worsening visual disturbance.  Respiratory: Negative for choking and stridor.   Gastrointestinal: Negative for vomiting or other signifcant bowel change Genitourinary: Negative for hematuria or decreased urine volume.  Musculoskeletal: Negative for other MSK pain or swelling Skin: Negative for color change and worsening wound.  Neurological: Negative for tremors and numbness other than noted  Psychiatric/Behavioral: Negative for decreased concentration or agitation other than above       Objective:   Physical Exam BP 132/76 mmHg  Pulse 77  Temp(Src) 97.7 F (36.5 C) (Oral)  Ht 5\' 10"  (1.778 m)  Wt 190 lb (86.183 kg)  BMI 27.26 kg/m2  SpO2 97% VS noted,  Constitutional: Pt appears well-developed, well-nourished/mild overwt.  HENT: Head: NCAT.  Right Ear: External ear normal.  Left Ear: External ear normal.  Eyes: . Pupils are equal, round, and reactive to light. Conjunctivae and EOM are normal Neck: Normal range of motion. Neck supple.  Cardiovascular: Normal rate and regular rhythm.   Pulmonary/Chest: Effort normal and breath sounds normal.  Neurological: Pt is alert. Not confused , motor grossly intact Skin: Skin is  warm. No rash Psychiatric: Pt behavior is normal. No agitation. not depressed affect    Assessment & Plan:

## 2014-02-07 NOTE — Progress Notes (Signed)
Pre visit review using our clinic review tool, if applicable. No additional management support is needed unless otherwise documented below in the visit note. 

## 2014-02-07 NOTE — Patient Instructions (Signed)
Please take all new medication as prescribed - the glipizide ER 5 mg per day  You have the glucometer and supplies sent to your pharmacy  Please check your sugars before meals and occas before bedtime as well for at least 2 wks  Please call if your sugars are still over 175, to consider a higher dose of the glipizide ER to 10 mg  Please continue all other medications as before, including the metformin at 1 per day  You will be contacted regarding the referral for: Diabetes Education  Please have the pharmacy call with any other refills you may need.  Please continue your efforts at being more active, low cholesterol diabetic diet, and weight control.  Please return in 3 months, or sooner if needed, with Lab testing done 3-5 days before  You are also given the prescription for the shingles shot to take to a less expensive pharmacy

## 2014-02-09 NOTE — Assessment & Plan Note (Signed)
Uncontrolled, hesitates for insulin, to cont metformin 1000 qd, add glipizide xl 5 qd, chech cbg's at home, to call if persistently elevated at 1-2 wks, o/w f/u lab next visit, also refer DM education Lab Results  Component Value Date   HGBA1C 10.1* 02/04/2014

## 2014-02-09 NOTE — Assessment & Plan Note (Signed)
stable overall by history and exam, recent data reviewed with pt, and pt to continue medical treatment as before,  to f/u any worsening symptoms or concerns Lab Results  Component Value Date   WBC 6.9 01/29/2014   HGB 14.8 01/29/2014   HCT 43.8 01/29/2014   PLT 177.0 01/29/2014   GLUCOSE 297* 01/29/2014   CHOL 245* 02/04/2014   TRIG 940.0 Verified by manual dilution.* 02/04/2014   HDL 33.80* 02/04/2014   LDLDIRECT 74.0 02/04/2014   LDLCALC 82 04/11/2011   ALT 21 04/11/2011   ALT 21 04/11/2011   AST 18 04/11/2011   AST 18 04/11/2011   NA 129* 01/29/2014   K 4.2 01/29/2014   CL 97 01/29/2014   CREATININE 1.2 01/29/2014   BUN 19 01/29/2014   CO2 23 01/29/2014   TSH 1.65 02/04/2014   PSA 0.70 02/04/2014   INR 1.3 01/01/2013   HGBA1C 10.1* 02/04/2014   MICROALBUR 4.6* 02/04/2014

## 2014-02-09 NOTE — Assessment & Plan Note (Signed)
Lab Results  Component Value Date   CHOL 245* 02/04/2014   HDL 33.80* 02/04/2014   LDLCALC 82 04/11/2011   LDLDIRECT 74.0 02/04/2014   TRIG 940.0 Verified by manual dilution.* 02/04/2014   CHOLHDL 7 02/04/2014   Elevated as well, d/w pt likely not chronic, most liekly related to recent diet/activity levels and being unable to tolerate the metformin, with then secondary lipid changes.  To cont same tx for now, f/u lab next visit, consider fenofibrate for persistnet elev TG

## 2014-02-12 NOTE — Interval H&P Note (Signed)
History and Physical Interval Note:  02/12/2014 11:12 AM  [name]  has presented today for surgery, with the diagnosis of ERI  The various methods of treatment have been discussed with the patient and family. After consideration of risks, benefits and other options for treatment, the patient has consented to  Procedure(s): ICD GENERATOR CHANGE (N/A) as a surgical intervention .  The patient's history has been reviewed, patient examined, no change in status, stable for surgery.  I have reviewed the patient's chart and labs.  Questions were answered to the patient's satisfaction.     [name]

## 2014-02-14 ENCOUNTER — Telehealth: Payer: Self-pay | Admitting: Internal Medicine

## 2014-02-14 NOTE — Telephone Encounter (Signed)
PT WANTED [phone] TO KNOW IF WE ARE GETTING MONITORING ON HIS DEVICE TRANSMITTER DOESN'T KNOW IF HE SET IT UP RIGHT

## 2014-02-14 NOTE — Telephone Encounter (Signed)
Informed pt that home monitor is working. Pt verbalized understanding.

## 2014-02-17 ENCOUNTER — Ambulatory Visit (INDEPENDENT_AMBULATORY_CARE_PROVIDER_SITE_OTHER): Payer: Medicare Other | Admitting: *Deleted

## 2014-02-17 DIAGNOSIS — I5022 Chronic systolic (congestive) heart failure: Secondary | ICD-10-CM

## 2014-02-17 DIAGNOSIS — I4901 Ventricular fibrillation: Secondary | ICD-10-CM

## 2014-02-17 LAB — MDC_IDC_ENUM_SESS_TYPE_INCLINIC
Battery Remaining Longevity: 134 mo
Battery Voltage: 3.13 V
Brady Statistic AP VP Percent: 0.97 %
Brady Statistic AP VS Percent: 3.79 %
Brady Statistic AS VS Percent: 95.03 %
Brady Statistic RV Percent Paced: 1.18 %
Date Time Interrogation Session: 20151221103443
HighPow Impedance: 19 Ohm
HighPow Impedance: 45 Ohm
HighPow Impedance: 65 Ohm
Lead Channel Impedance Value: 475 Ohm
Lead Channel Pacing Threshold Amplitude: 0.5 V
Lead Channel Pacing Threshold Pulse Width: 0.4 ms
Lead Channel Sensing Intrinsic Amplitude: 31.5 mV
Lead Channel Sensing Intrinsic Amplitude: 31.625 mV
Lead Channel Sensing Intrinsic Amplitude: 4.375 mV
Lead Channel Setting Pacing Amplitude: 1.5 V
Lead Channel Setting Sensing Sensitivity: 0.45 mV
MDC IDC MSMT LEADCHNL RA SENSING INTR AMPL: 4.875 mV
MDC IDC MSMT LEADCHNL RV IMPEDANCE VALUE: 456 Ohm
MDC IDC MSMT LEADCHNL RV PACING THRESHOLD AMPLITUDE: 0.75 V
MDC IDC MSMT LEADCHNL RV PACING THRESHOLD PULSEWIDTH: 0.4 ms
MDC IDC SET LEADCHNL RV PACING AMPLITUDE: 2 V
MDC IDC SET LEADCHNL RV PACING PULSEWIDTH: 0.4 ms
MDC IDC SET ZONE DETECTION INTERVAL: 240 ms
MDC IDC STAT BRADY AS VP PERCENT: 0.21 %
MDC IDC STAT BRADY RA PERCENT PACED: 4.76 %
Zone Setting Detection Interval: 280 ms
Zone Setting Detection Interval: 350 ms
Zone Setting Detection Interval: 450 ms

## 2014-02-17 NOTE — Progress Notes (Signed)
Wound check appointment. Durabond removed. Wound without redness or edema. Incision edges approximated, wound well healed. Normal device function. Thresholds, sensing, and impedances consistent with implant measurements. Device programmed at 3.5V for extra safety margin until 3 month visit. Histogram distribution appropriate for patient and level of activity. 188 mode switches, + xarelto.  Total burden 3.1%.  No ventricular arrhythmias noted. Patient educated about wound care, arm mobility, lifting restrictions, shock plan. ROV in 3 months with implanting physician.

## 2014-02-25 ENCOUNTER — Telehealth: Payer: Self-pay | Admitting: Internal Medicine

## 2014-02-25 MED ORDER — GLIPIZIDE ER 10 MG PO TB24: 10.0000 mg | ORAL_TABLET | Freq: Every day | ORAL | Status: DC

## 2014-02-25 NOTE — Telephone Encounter (Signed)
Patient informed of PCP instructions. 

## 2014-02-25 NOTE — Telephone Encounter (Signed)
Ok for increase glipizide ER from 5 to 10 mg, new rx sent, and ok to take 2 of his 5 mg for now until used up  Continue to monitor for any low sugars

## 2014-02-25 NOTE — Telephone Encounter (Signed)
Patient states his blood glucose readings for the past 5 days starting 02/21/14 are as follows: 175, 217, 180, 165, 195, 181. He states he feels great and no longer has diarrhea. His concern is that Dr. Jenny Reichmann told him that his glucose readings should be averaging 175 and he is averaging above this. He would like to know if Dr. Jenny Reichmann wants him to change anything. CB# [phone]

## 2014-02-26 ENCOUNTER — Telehealth: Payer: Self-pay | Admitting: Internal Medicine

## 2014-02-26 NOTE — Telephone Encounter (Signed)
Called the pharmacy and was informed this message to disregard as mistake on their message machine.

## 2014-02-26 NOTE — Telephone Encounter (Signed)
Pharmacist, called about cough syrup called in requesting a call.....[phone] Rachel Moulds or [place])

## 2014-02-27 ENCOUNTER — Other Ambulatory Visit: Payer: Self-pay | Admitting: Internal Medicine

## 2014-03-07 ENCOUNTER — Encounter: Payer: Self-pay | Admitting: Internal Medicine

## 2014-03-24 ENCOUNTER — Ambulatory Visit: Payer: Medicare Other | Admitting: Dietician

## 2014-03-31 ENCOUNTER — Encounter: Payer: Self-pay | Admitting: Internal Medicine

## 2014-03-31 ENCOUNTER — Other Ambulatory Visit: Payer: Self-pay | Admitting: Internal Medicine

## 2014-04-01 NOTE — Telephone Encounter (Signed)
Done hardcopy to [facility]  

## 2014-04-02 NOTE — Telephone Encounter (Signed)
Faxed hardcopy for Zolpidem to [facility]

## 2014-05-09 ENCOUNTER — Encounter: Payer: Self-pay | Admitting: Internal Medicine

## 2014-05-09 ENCOUNTER — Ambulatory Visit (INDEPENDENT_AMBULATORY_CARE_PROVIDER_SITE_OTHER): Payer: Medicare Other | Admitting: Internal Medicine

## 2014-05-09 VITALS — BP 90/60 | HR 91 | Ht 70.0 in | Wt 189.0 lb

## 2014-05-09 DIAGNOSIS — I5022 Chronic systolic (congestive) heart failure: Secondary | ICD-10-CM

## 2014-05-09 DIAGNOSIS — I255 Ischemic cardiomyopathy: Secondary | ICD-10-CM | POA: Diagnosis not present

## 2014-05-09 DIAGNOSIS — I480 Paroxysmal atrial fibrillation: Secondary | ICD-10-CM | POA: Diagnosis not present

## 2014-05-09 DIAGNOSIS — Z4502 Encounter for adjustment and management of automatic implantable cardiac defibrillator: Secondary | ICD-10-CM

## 2014-05-09 DIAGNOSIS — Z79899 Other long term (current) drug therapy: Secondary | ICD-10-CM | POA: Diagnosis not present

## 2014-05-09 LAB — BASIC METABOLIC PANEL
BUN: 19 mg/dL (ref 6–23)
CO2: 31 mEq/L (ref 19–32)
Calcium: 9.8 mg/dL (ref 8.4–10.5)
Chloride: 99 mEq/L (ref 96–112)
Creatinine, Ser: 1.27 mg/dL (ref 0.40–1.50)
GFR: 60.2 mL/min (ref >60.00)
Glucose, Bld: 146 mg/dL — ABNORMAL HIGH (ref 70–99)
POTASSIUM: 4.2 meq/L (ref 3.5–5.1)
Sodium: 134 mEq/L — ABNORMAL LOW (ref 135–145)

## 2014-05-09 LAB — HEMOGLOBIN A1C: Hgb A1c MFr Bld: 8.2 % — ABNORMAL HIGH (ref 4.6–6.5)

## 2014-05-09 LAB — MAGNESIUM: MAGNESIUM: 1.8 mg/dL (ref 1.5–2.5)

## 2014-05-09 NOTE — Progress Notes (Signed)
Patient Care Team: Neena Rhymes, MD as PCP - General   HPI  [name] is a 69 y.o. male Seen in followup for aborted cardiac arrest, atrial fibrillation and most recently an episode of syncope this occurs in the context of  ischemic cardiomyopathy with prior stenting of his LAD circumflex and RCA. Catheterization 1/14 demonstrated no obstructive and and patent grafts. He is status post ICD implantation. He underwent device generator replacement 12/15 with the adjunctive use of antimicrobial pouch  He has undergone convergent ablation at [facility]. He managed post ablation dofetilide.  He has ischemic heart disease with prior stenting  Echocardiogram 8/14  Demonstrated ejecti on fraction 20% and inferoposterior akinesis. There is moderate left atrial enlargement. There was no interval improvement in ejection fraction following convergent ablation. It was 20-25% 7/15  At his last visit we were able to finally get him on low-dose beta blockers as well as ACE inhibitor.  Since having undergone generator replacement he has been somewhat more active. He has been using his chainsaw. He has been involved at work sometimes up to 8-10 hours a day. He paces himself well.  He had one episode about 6 weeks ago when he "felt like he was going to die" he wonders whether this was related to taking too much one of his medications. He has not recurred.    Past Medical History  Diagnosis Date  . Ischemic cardiomyopathy 09/22/2008    DES CX and RCA  70% residual LAD  Done in [place]; EF 30%;; myoviewq 05-05-2006 no ischemia  . Implantable cardiac defibrillator MDT 09/22/2008    Change out May 05, 2009 . NEPHROLITHIASIS, HX OF 02/25/2008  . CALLUS, RIGHT FOOT 12/08/2009  . Chronic systolic heart failure 4/0/3474  . Persistent atrial fibrillation 11/25/2006    Inapp shock 05/05/10 AF VR >220 recurrent  . HYPERLIPIDEMIA 11/25/2006  . HYPOGONADISM, MALE 12/05/2006  . 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  DIABETES MELLITUS, TYPE II 12/05/2006  . 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 Long term (current) use of anticoagulants 04/25/2010  . ICD (implantable cardiac defibrillator) in place     medtronic  . Myocardial infarction 2004/05/05    "I died and they brought me back"  . Kidney stones     Past Surgical History  Procedure Laterality Date  . Cataract extraction    . Tonsillectomy    . Cardiac defibrillator placement      Guidant Vitality [name]  . Ptca    . Tonsillectomy    . Eye surgery      cataracts  . Cardiac catheterization      feburary May 05, 2012  . Cardioversion N/A 04/25/2012    Procedure: CARDIOVERSION;  Surgeon: Darlin Coco, MD;  Location: [facility] ENDOSCOPY;  Service: Cardiovascular;  Laterality: N/A;  . Cardioversion N/A 05/21/2012    Procedure: CARDIOVERSION;  Surgeon: Deboraha Sprang, MD;  Location: [place];  Service: Cardiovascular;  Laterality: N/A;  . Implantable cardioverter defibrillator (icd) generator change N/A 02/05/2014    Procedure: ICD GENERATOR CHANGE;  Surgeon: Deboraha Sprang, MD;  Location: [facility] CATH LAB;  Service: Cardiovascular;  Laterality: N/A;    Current Outpatient Prescriptions  Medication Sig Dispense Refill  . acetaminophen (TYLENOL) 325 MG tablet Take 650 mg by mouth as needed for mild pain, moderate pain, fever or headache.     . Ascorbic Acid (VITAMIN C PO) Take 1 tablet by mouth daily.    . Blood Glucose Monitoring  Suppl (ONE TOUCH ULTRA SYSTEM KIT) W/DEVICE KIT 1 kit by Does not apply route once. 1 each 0  . carvedilol (COREG) 6.25 MG tablet Take 1 tablet (6.25 mg total) by mouth 2 (two) times daily. 180 tablet 3  . dofetilide (TIKOSYN) 500 MCG capsule Take 500 mcg by mouth 2 (two) times daily.    . fluticasone (FLONASE) 50 MCG/ACT nasal spray Place 2 sprays into both nostrils daily. 16 g 5  . furosemide (LASIX) 20 MG tablet TAKE ONE TABLET BY MOUTH EVERY DAY 90 tablet 0  . glipiZIDE (GLUCOTROL XL) 10 MG 24 hr tablet Take 1 tablet (10 mg  total) by mouth daily with breakfast. 90 tablet 3  . glucose blood (ONE TOUCH TEST STRIPS) test strip Use as instructed up to four times per day   250.02 100 each 12  . Lancets MISC Use as directed up to 4 times per day 100 each 11  . lisinopril (PRINIVIL,ZESTRIL) 2.5 MG tablet Take 1 tablet by mouth daily. 30 tablet 6  . loratadine (CLARITIN) 10 MG tablet Take 10 mg by mouth as needed for allergies.     . Magnesium Oxide (MAG-OX 400 PO) Take 1 tablet by mouth 2 (two) times daily.    . metFORMIN (GLUCOPHAGE) 1000 MG tablet Take 1 tablet (1,000 mg total) by mouth daily with breakfast. 90 tablet 3  . Multiple Vitamins-Minerals (MULTIVITAMIN PO) Take 1 tablet by mouth daily.    . nitroGLYCERIN (NITROSTAT) 0.4 MG SL tablet Place 0.4 mg under the tongue every 5 (five) minutes as needed for chest pain.     . Rivaroxaban (XARELTO) 20 MG TABS tablet Take 20 mg by mouth daily with supper.    . sildenafil (VIAGRA) 100 MG tablet Take 100 mg by mouth daily as needed for erectile dysfunction.    . Tamsulosin HCl (FLOMAX) 0.4 MG CAPS Take 0.4 mg by mouth daily as needed (kidney stones).     . zolpidem (AMBIEN) 5 MG tablet TAKE TWO TABLETS BY MOUTH AT BEDTIME AS NEEDED FOR SLEEP 90 tablet 1   No current facility-administered medications for this visit.    Allergies  Allergen Reactions  . Salmon [Fish Allergy] Other (See Comments)    Only canned/ not fresh (probably preservatives).     Review of Systems negative except from HPI and PMH  Physical Exam BP 90/60 mmHg  Pulse 91  Ht $R'5\' 10"'mX$  (1.778 m)  Wt 189 lb (85.73 kg)  BMI 27.12 kg/m2 Well developed and nourished in no acute distress HENT normal Neck supple with JVP-flat Clear Device pocket well healed; without hematoma or erythema.  There is no tethering  Regular rate and rhythm, no murmurs or gallops Abd-soft with active BS No Clubbing cyanosis edema Skin-warm and dry A & Oriented  Grossly normal sensory and motor function\  ECG  demonstrates atrial pacing at 81 Intervals 22/14/46  Assessment and  Plan  Atrial fibrillation s/p convergent ablation on dofetilide  Ischemic cardiomyopathy with three-vessel stenting and EF 20%  Congestive heart failure-chronic-systolic  Diabetes]  Implantable defibrillator-Medtronic The patient's device was interrogated.  The information was reviewed. No changes were made in the programming.    Hypotension  asymptomatic  There is no significant symptoms of ischemia. Catheterization demonstrated patent grafts 1/14  He is tolerating his medications.  He is euvolemic.  His atrial fibrillation burden is stable. We need to undertake surveillance laboratories for his high risk medication.   We will measure his hemoglobin A1c  His daughter also  asked questions regarding cardiovascular prognosis. We discussed a 50% 4-six-year survival prognosis in the context of his medical therapy and device therapy.

## 2014-05-09 NOTE — Patient Instructions (Addendum)
Your physician recommends that you continue on your current medications as directed. Please refer to the Current Medication list given to you today.  Labs today: BMET, Magnesium level, Hemoglobin A1c  Remote monitoring is used to monitor your  ICD from home. This monitoring reduces the number of office visits required to check your device to one time per year. It allows [place] to keep an eye on the functioning of your device to ensure it is working properly. You are scheduled for a device check from home on 08-11-2014. You may send your transmission at any time that day. If you have a wireless device, the transmission will be sent automatically. After your physician reviews your transmission, you will receive a postcard with your next transmission date.  Your physician recommends that you schedule a follow-up appointment in: 6 months with Chanetta Marshall, NP.

## 2014-05-13 LAB — MDC_IDC_ENUM_SESS_TYPE_INCLINIC
Brady Statistic AP VS Percent: 22.8 %
Lead Channel Impedance Value: 399 Ohm
Lead Channel Impedance Value: 456 Ohm
Lead Channel Pacing Threshold Pulse Width: 0.4 ms
Lead Channel Sensing Intrinsic Amplitude: 20 mV
Lead Channel Setting Pacing Amplitude: 2.5 V
Lead Channel Setting Pacing Pulse Width: 0.4 ms
MDC IDC MSMT LEADCHNL RA SENSING INTR AMPL: 3.4 mV
MDC IDC MSMT LEADCHNL RV PACING THRESHOLD AMPLITUDE: 1 V
MDC IDC SET LEADCHNL RA PACING AMPLITUDE: 2 V
MDC IDC SET LEADCHNL RV SENSING SENSITIVITY: 0.45 mV
MDC IDC SET ZONE DETECTION INTERVAL: 280 ms
MDC IDC SET ZONE DETECTION INTERVAL: 350 ms
MDC IDC STAT BRADY AP VP PERCENT: 1.2 %
MDC IDC STAT BRADY AS VP PERCENT: 0.3 %
MDC IDC STAT BRADY AS VS PERCENT: 75.8 %
Zone Setting Detection Interval: 240 ms
Zone Setting Detection Interval: 450 ms

## 2014-05-14 ENCOUNTER — Ambulatory Visit (INDEPENDENT_AMBULATORY_CARE_PROVIDER_SITE_OTHER): Payer: Medicare Other | Admitting: Internal Medicine

## 2014-05-14 ENCOUNTER — Encounter: Payer: Self-pay | Admitting: Internal Medicine

## 2014-05-14 VITALS — BP 118/64 | HR 82 | Temp 98.3°F | Resp 18 | Ht 70.0 in | Wt 182.0 lb

## 2014-05-14 DIAGNOSIS — Z0000 Encounter for general adult medical examination without abnormal findings: Secondary | ICD-10-CM

## 2014-05-14 DIAGNOSIS — E0842 Diabetes mellitus due to underlying condition with diabetic polyneuropathy: Secondary | ICD-10-CM

## 2014-05-14 DIAGNOSIS — E785 Hyperlipidemia, unspecified: Secondary | ICD-10-CM

## 2014-05-14 DIAGNOSIS — F32A Depression, unspecified: Secondary | ICD-10-CM

## 2014-05-14 DIAGNOSIS — F329 Major depressive disorder, single episode, unspecified: Secondary | ICD-10-CM

## 2014-05-14 DIAGNOSIS — Z0189 Encounter for other specified special examinations: Secondary | ICD-10-CM

## 2014-05-14 MED ORDER — PIOGLITAZONE HCL 15 MG PO TABS: 15.0000 mg | ORAL_TABLET | Freq: Every day | ORAL | Status: DC

## 2014-05-14 NOTE — Assessment & Plan Note (Signed)
stable overall by history and exam, recent data reviewed with pt, and pt to continue medical treatment as before,  to f/u any worsening symptoms or concerns Lab Results  Component Value Date   WBC 6.9 01/29/2014   HGB 14.8 01/29/2014   HCT 43.8 01/29/2014   PLT 177.0 01/29/2014   GLUCOSE 146* 05/09/2014   CHOL 245* 02/04/2014   TRIG 940.0 Verified by manual dilution.* 02/04/2014   HDL 33.80* 02/04/2014   LDLDIRECT 74.0 02/04/2014   LDLCALC 82 04/11/2011   ALT 21 04/11/2011   ALT 21 04/11/2011   AST 18 04/11/2011   AST 18 04/11/2011   NA 134* 05/09/2014   K 4.2 05/09/2014   CL 99 05/09/2014   CREATININE 1.27 05/09/2014   BUN 19 05/09/2014   CO2 31 05/09/2014   TSH 1.65 02/04/2014   PSA 0.70 02/04/2014   INR 1.3 01/01/2013   HGBA1C 8.2* 05/09/2014   MICROALBUR 4.6* 02/04/2014  \

## 2014-05-14 NOTE — Progress Notes (Signed)
Subjective:    Patient ID: [name], male    DOB: December 21, 1945, 69 y.o.   MRN: [id number]  HPI  Here to f/u; overall doing ok,  Pt denies chest pain, increasing sob or doe, wheezing, orthopnea, PND, increased LE swelling, palpitations, dizziness or syncope.  Pt denies new neurological symptoms such as new headache, or facial or extremity weakness or numbness.  Pt denies polydipsia, polyuria, or low sugar episode.   Pt denies new neurological symptoms such as new headache, or facial or extremity weakness or numbness.   Pt states overall good compliance with meds, mostly trying to follow appropriate diet, with wt overall stable,  but little exercise however. Diarrhea now much improved, only minor at this point.  Denies worsening depressive symptoms, suicidal ideation, or panic Past Medical History  Diagnosis Date  . Ischemic cardiomyopathy 09/22/2008    DES CX and RCA  70% residual LAD  Done in [place]; EF 30%;; myoviewq 2006-05-19 no ischemia  . Implantable cardiac defibrillator MDT 09/22/2008    Change out 05/19/2009 . NEPHROLITHIASIS, HX OF 02/25/2008  . CALLUS, RIGHT FOOT 12/08/2009  . Chronic systolic heart failure 0/03/6551  . Persistent atrial fibrillation 11/25/2006    Inapp shock 05-19-2010 AF VR >220 recurrent  . HYPERLIPIDEMIA 11/25/2006  . HYPOGONADISM, MALE 12/05/2006  . 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 DIABETES MELLITUS, TYPE II 12/05/2006  . 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 Long term (current) use of anticoagulants 04/25/2010  . ICD (implantable cardiac defibrillator) in place     medtronic  . Myocardial infarction 2004-05-19    "I died and they brought me back"  . Kidney stones    Past Surgical History  Procedure Laterality Date  . Cataract extraction    . Tonsillectomy    . Cardiac defibrillator placement      Guidant Vitality [name]  . Ptca    . Tonsillectomy    . Eye surgery      cataracts  . Cardiac catheterization      feburary 2012-05-19  . Cardioversion N/A  04/25/2012    Procedure: CARDIOVERSION;  Surgeon: Darlin Coco, MD;  Location: [facility] ENDOSCOPY;  Service: Cardiovascular;  Laterality: N/A;  . Cardioversion N/A 05/21/2012    Procedure: CARDIOVERSION;  Surgeon: Deboraha Sprang, MD;  Location: [place];  Service: Cardiovascular;  Laterality: N/A;  . Implantable cardioverter defibrillator (icd) generator change N/A 02/05/2014    Procedure: ICD GENERATOR CHANGE;  Surgeon: Deboraha Sprang, MD;  Location: [facility] CATH LAB;  Service: Cardiovascular;  Laterality: N/A;    reports that he has never smoked. He has never used smokeless tobacco. He reports that he drinks alcohol. He reports that he does not use illicit drugs. family history includes Breast cancer in his mother; Diabetes in his maternal grandmother; Hyperlipidemia in his maternal grandmother. There is no history of Cancer, COPD, or Heart disease. Allergies  Allergen Reactions  . Salmon [Fish Allergy] Other (See Comments)    Only canned/ not fresh (probably preservatives).    Current Outpatient Prescriptions on File Prior to Visit  Medication Sig Dispense Refill  . acetaminophen (TYLENOL) 325 MG tablet Take 650 mg by mouth as needed for mild pain, moderate pain, fever or headache.     . Ascorbic Acid (VITAMIN C PO) Take 1 tablet by mouth daily.    . Blood Glucose Monitoring Suppl (ONE TOUCH ULTRA SYSTEM KIT) W/DEVICE KIT 1 kit by Does not apply route once. 1 each 0  .  carvedilol (COREG) 6.25 MG tablet Take 1 tablet (6.25 mg total) by mouth 2 (two) times daily. 180 tablet 3  . dofetilide (TIKOSYN) 500 MCG capsule Take 500 mcg by mouth 2 (two) times daily.    . fluticasone (FLONASE) 50 MCG/ACT nasal spray Place 2 sprays into both nostrils daily. 16 g 5  . furosemide (LASIX) 20 MG tablet TAKE ONE TABLET BY MOUTH EVERY DAY 90 tablet 0  . glipiZIDE (GLUCOTROL XL) 10 MG 24 hr tablet Take 1 tablet (10 mg total) by mouth daily with breakfast. 90 tablet 3  . glucose blood (ONE TOUCH TEST STRIPS) test  strip Use as instructed up to four times per day   250.02 100 each 12  . Lancets MISC Use as directed up to 4 times per day 100 each 11  . lisinopril (PRINIVIL,ZESTRIL) 2.5 MG tablet Take 1 tablet by mouth daily. 30 tablet 6  . loratadine (CLARITIN) 10 MG tablet Take 10 mg by mouth as needed for allergies.     . Magnesium Oxide (MAG-OX 400 PO) Take 1 tablet by mouth 2 (two) times daily.    . metFORMIN (GLUCOPHAGE) 1000 MG tablet Take 1 tablet (1,000 mg total) by mouth daily with breakfast. 90 tablet 3  . Multiple Vitamins-Minerals (MULTIVITAMIN PO) Take 1 tablet by mouth daily.    . nitroGLYCERIN (NITROSTAT) 0.4 MG SL tablet Place 0.4 mg under the tongue every 5 (five) minutes as needed for chest pain.     . Rivaroxaban (XARELTO) 20 MG TABS tablet Take 20 mg by mouth daily with supper.    . sildenafil (VIAGRA) 100 MG tablet Take 100 mg by mouth daily as needed for erectile dysfunction.    . Tamsulosin HCl (FLOMAX) 0.4 MG CAPS Take 0.4 mg by mouth daily as needed (kidney stones).     . zolpidem (AMBIEN) 5 MG tablet TAKE TWO TABLETS BY MOUTH AT BEDTIME AS NEEDED FOR SLEEP 90 tablet 1   No current facility-administered medications on file prior to visit.    Review of Systems  Constitutional: Negative for unusual diaphoresis or night sweats HENT: Negative for ringing in ear or discharge Eyes: Negative for double vision or worsening visual disturbance.  Respiratory: Negative for choking and stridor.   Gastrointestinal: Negative for vomiting or other signifcant bowel change Genitourinary: Negative for hematuria or change in urine volume.  Musculoskeletal: Negative for other MSK pain or swelling Skin: Negative for color change and worsening wound.  Neurological: Negative for tremors and numbness other than noted  Psychiatric/Behavioral: Negative for decreased concentration or agitation other than above       Objective:   Physical Exam BP 118/64 mmHg  Pulse 82  Temp(Src) 98.3 F (36.8 C)  (Oral)  Resp 18  Ht '5\' 10"'  (1.778 m)  Wt 182 lb (82.555 kg)  BMI 26.11 kg/m2  SpO2 97% VS noted,  Constitutional: Pt appears in no significant distress HENT: Head: NCAT.  Right Ear: External ear normal.  Left Ear: External ear normal.  Eyes: . Pupils are equal, round, and reactive to light. Conjunctivae and EOM are normal Neck: Normal range of motion. Neck supple.  Cardiovascular: Normal rate and regular rhythm.   Pulmonary/Chest: Effort normal and breath sounds without rales or wheezing.  Neurological: Pt is alert. Not confused , motor grossly intact Skin: Skin is warm. No rash, no LE edema Psychiatric: Pt behavior is normal. No agitation. mild dysphoric, irritable    Assessment & Plan:

## 2014-05-14 NOTE — Assessment & Plan Note (Signed)
Improved but still mild uncontrolled - for add actos 15 qd, cont all other meds, diet,  to f/u any worsening symptoms or concerns, f/u lab 9 months per pt preference

## 2014-05-14 NOTE — Assessment & Plan Note (Signed)
stable overall by history and exam, recent data reviewed with pt, and pt to continue medical treatment as before,  to f/u any worsening symptoms or concerns Lab Results  Component Value Date   LDLCALC 82 04/11/2011

## 2014-05-14 NOTE — Patient Instructions (Signed)
Please take all new medication as prescribed - the actos at 15 mg per day  Please continue all other medications as before, and refills have been done if requested.  Please have the pharmacy call with any other refills you may need.  Please continue your efforts at being more active, low cholesterol diabetic diet, and weight control.  Please keep your appointments with your specialists as you may have planned  Please return in 9 months, or sooner if needed, with Lab testing done 3-5 days before

## 2014-05-14 NOTE — Progress Notes (Signed)
Pre visit review using our clinic review tool, if applicable. No additional management support is needed unless otherwise documented below in the visit note. 

## 2014-07-04 ENCOUNTER — Other Ambulatory Visit: Payer: Self-pay | Admitting: Internal Medicine

## 2014-07-04 NOTE — Telephone Encounter (Signed)
Sent to pharmacy 

## 2014-07-04 NOTE — Telephone Encounter (Signed)
Done hardcopy to cherina 

## 2014-07-18 ENCOUNTER — Encounter: Payer: Self-pay | Admitting: Family Medicine

## 2014-07-18 ENCOUNTER — Ambulatory Visit (INDEPENDENT_AMBULATORY_CARE_PROVIDER_SITE_OTHER): Payer: Medicare Other | Admitting: Family Medicine

## 2014-07-18 VITALS — BP 100/61 | HR 84 | Temp 98.1°F | Ht 70.0 in | Wt 194.0 lb

## 2014-07-18 DIAGNOSIS — J209 Acute bronchitis, unspecified: Secondary | ICD-10-CM

## 2014-07-18 MED ORDER — CEFUROXIME AXETIL 500 MG PO TABS: 500.0000 mg | ORAL_TABLET | Freq: Two times a day (BID) | ORAL | Status: DC

## 2014-07-18 NOTE — Progress Notes (Signed)
Pre visit review using our clinic review tool, if applicable. No additional management support is needed unless otherwise documented below in the visit note. 

## 2014-07-18 NOTE — Progress Notes (Signed)
   Subjective:    Patient ID: [name], male    DOB: [birth year], 69 y.o.   MRN: [id number]  HPI Here for one week of chest congestion, PND, and a dry cough.    Review of Systems  Constitutional: Negative.   HENT: Positive for congestion and postnasal drip. Negative for sinus pressure.   Eyes: Negative.   Respiratory: Positive for cough and chest tightness.        Objective:   Physical Exam  Constitutional: He appears well-developed and well-nourished.  HENT:  Right Ear: External ear normal.  Left Ear: External ear normal.  Nose: Nose normal.  Mouth/Throat: Oropharynx is clear and moist.  Eyes: Conjunctivae are normal.  Pulmonary/Chest: Effort normal. No respiratory distress. He has no wheezes. He has no rales.  Scattered rhonchi   Lymphadenopathy:    He has no cervical adenopathy.          Assessment & Plan:  Bronchitis treat with Ceftin and Mucinex

## 2014-07-23 ENCOUNTER — Other Ambulatory Visit: Payer: Self-pay | Admitting: *Deleted

## 2014-07-23 ENCOUNTER — Telehealth: Payer: Self-pay | Admitting: *Deleted

## 2014-07-23 MED ORDER — DOFETILIDE 500 MCG PO CAPS: 500.0000 ug | ORAL_CAPSULE | Freq: Two times a day (BID) | ORAL | Status: DC

## 2014-07-23 NOTE — Telephone Encounter (Signed)
Ok to refill Tikosyn 500 mcg BID x 6 months, per Dr. Caryl Comes.

## 2014-07-23 NOTE — Telephone Encounter (Signed)
Patient called and stated that the pharmacy has not been able to reach his physician at [facility] about refilling the tikosyn. He tells me this was prescribed by a [facility] physician, but since he is out of the medication, he thought he would see if Dr Caryl Comes would refill this. Please advise. Thanks, MI

## 2014-07-29 ENCOUNTER — Telehealth: Payer: Self-pay | Admitting: Internal Medicine

## 2014-07-29 DIAGNOSIS — Z79899 Other long term (current) drug therapy: Secondary | ICD-10-CM

## 2014-07-29 MED ORDER — AMOXICILLIN-POT CLAVULANATE 875-125 MG PO TABS: 1.0000 | ORAL_TABLET | Freq: Two times a day (BID) | ORAL | Status: DC

## 2014-07-29 NOTE — Telephone Encounter (Signed)
Patient asking about who is monitoring his labs on Tikosyn.  Informed patient that it looks as though Dr. Jenny Reichmann intends to.  Patient has not had lab since December and is due for 6 month lab follow up in few weeks, and he is not due to see Dr. Jenny Reichmann for 6 more months.  Advised patient that I would schedule him for medication management lab work next Monday.  He is agreeable to this.  He would still like to know if he can just be followed by Dr. Caryl Comes without having to follow up in [facility] at this point. (ablation a year and a half ago). Pt aware that I will let him know once reviewed by Dr. Caryl Comes .

## 2014-07-29 NOTE — Telephone Encounter (Signed)
New Message  Pt calling to speak w/ Rn. Pt is aware of Tikosyn refill: but per pt- needs new RX for it since his 30 supply he received from [facility] is running out: Pt needs to know if he is officially having Dr. Caryl Comes follow him for cardiology or if he needs to follow w/ Gaspar Cola MD going forward. Please call back and discuss.

## 2014-07-29 NOTE — Telephone Encounter (Signed)
Call in Augmentin [phone] bid for 10 days  

## 2014-07-29 NOTE — Telephone Encounter (Signed)
I sent script e-scribe and left a voice message for pt. 

## 2014-07-29 NOTE — Telephone Encounter (Signed)
Pt saw dr fry 5/20; dx aw/ bronchitis.  Pt has finished all meds. However, is better  But not well,.. Request another round of abx or whatever dr fry recommends [facility]/ [address]

## 2014-07-30 NOTE — Telephone Encounter (Signed)
Informed patient that we will be able to follow him in our office.  Patient is very relieved to hear this news.  He is aware recall letterwill be sent soon for follow up in September with nurse practitioner, Luetta Nutting.

## 2014-08-04 ENCOUNTER — Other Ambulatory Visit (INDEPENDENT_AMBULATORY_CARE_PROVIDER_SITE_OTHER): Payer: Medicare Other | Admitting: *Deleted

## 2014-08-04 DIAGNOSIS — Z79899 Other long term (current) drug therapy: Secondary | ICD-10-CM

## 2014-08-04 LAB — COMPREHENSIVE METABOLIC PANEL
ALK PHOS: 67 U/L (ref 39–117)
ALT: 18 U/L (ref 0–53)
AST: 18 U/L (ref 0–37)
Albumin: 3.9 g/dL (ref 3.5–5.2)
BUN: 22 mg/dL (ref 6–23)
CO2: 26 mEq/L (ref 19–32)
Calcium: 8.9 mg/dL (ref 8.4–10.5)
Chloride: 99 mEq/L (ref 96–112)
Creatinine, Ser: 1.09 mg/dL (ref 0.40–1.50)
GFR: 71.76 mL/min (ref >60.00)
Glucose, Bld: 175 mg/dL — ABNORMAL HIGH (ref 70–99)
Potassium: 4.1 mEq/L (ref 3.5–5.1)
Sodium: 133 mEq/L — ABNORMAL LOW (ref 135–145)
Total Bilirubin: 0.6 mg/dL (ref 0.2–1.2)
Total Protein: 6.9 g/dL (ref 6.0–8.3)

## 2014-08-04 LAB — TSH: TSH: 1.57 u[IU]/mL (ref 0.35–4.50)

## 2014-08-07 ENCOUNTER — Encounter: Payer: Self-pay | Admitting: Internal Medicine

## 2014-08-07 DIAGNOSIS — I255 Ischemic cardiomyopathy: Secondary | ICD-10-CM | POA: Diagnosis not present

## 2014-08-07 DIAGNOSIS — I5022 Chronic systolic (congestive) heart failure: Secondary | ICD-10-CM

## 2014-08-08 ENCOUNTER — Telehealth: Payer: Self-pay | Admitting: Internal Medicine

## 2014-08-08 MED ORDER — AMOXICILLIN-POT CLAVULANATE 875-125 MG PO TABS: 1.0000 | ORAL_TABLET | Freq: Two times a day (BID) | ORAL | Status: DC

## 2014-08-08 NOTE — Telephone Encounter (Signed)
Pt saw Dr Sarajane Jews on 07/18/14 and said he has bronchitis. He was given a rx for   amoxicillin-clavulanate (AUGMENTIN) 875-125 MG per tablet and has taken all of the med and fills he still has a little bit of bronchitis, He is asking for refill .    Pharmacy  [facility]

## 2014-08-08 NOTE — Telephone Encounter (Signed)
Pls advise.  

## 2014-08-08 NOTE — Telephone Encounter (Signed)
Call in Augmentin [phone] bid for 10 days  

## 2014-08-08 NOTE — Telephone Encounter (Signed)
Rx sent to pharmacy. Called and spoke with pt and pt is aware.

## 2014-08-11 ENCOUNTER — Telehealth: Payer: Self-pay | Admitting: Cardiology

## 2014-08-11 ENCOUNTER — Ambulatory Visit (INDEPENDENT_AMBULATORY_CARE_PROVIDER_SITE_OTHER): Payer: Medicare Other | Admitting: *Deleted

## 2014-08-11 DIAGNOSIS — I5022 Chronic systolic (congestive) heart failure: Secondary | ICD-10-CM

## 2014-08-11 DIAGNOSIS — I255 Ischemic cardiomyopathy: Secondary | ICD-10-CM

## 2014-08-11 NOTE — Telephone Encounter (Signed)
Spoke with pt and reminded pt of remote transmission that is due today. Pt verbalized understanding.   

## 2014-08-11 NOTE — Progress Notes (Signed)
Remote ICD transmission.   

## 2014-08-14 LAB — CUP PACEART REMOTE DEVICE CHECK
Battery Remaining Longevity: 124 mo
Battery Voltage: 3.05 V
Brady Statistic AP VP Percent: 1.54 %
Brady Statistic AP VS Percent: 39.3 %
Brady Statistic AS VS Percent: 58.69 %
Brady Statistic RA Percent Paced: 40.83 %
Date Time Interrogation Session: 20160609112100
HighPow Impedance: 45 Ohm
HighPow Impedance: 59 Ohm
Lead Channel Impedance Value: 399 Ohm
Lead Channel Impedance Value: 418 Ohm
Lead Channel Pacing Threshold Amplitude: 0.625 V
Lead Channel Pacing Threshold Amplitude: 1 V
Lead Channel Pacing Threshold Pulse Width: 0.4 ms
Lead Channel Sensing Intrinsic Amplitude: 4.5 mV
Lead Channel Sensing Intrinsic Amplitude: 4.5 mV
Lead Channel Setting Pacing Amplitude: 2.5 V
Lead Channel Setting Sensing Sensitivity: 0.45 mV
MDC IDC MSMT LEADCHNL RA IMPEDANCE VALUE: 456 Ohm
MDC IDC MSMT LEADCHNL RV PACING THRESHOLD PULSEWIDTH: 0.4 ms
MDC IDC MSMT LEADCHNL RV SENSING INTR AMPL: 29.625 mV
MDC IDC MSMT LEADCHNL RV SENSING INTR AMPL: 29.625 mV
MDC IDC SET LEADCHNL RA PACING AMPLITUDE: 2 V
MDC IDC SET LEADCHNL RV PACING PULSEWIDTH: 0.4 ms
MDC IDC SET ZONE DETECTION INTERVAL: 280 ms
MDC IDC SET ZONE DETECTION INTERVAL: 450 ms
MDC IDC STAT BRADY AS VP PERCENT: 0.48 %
MDC IDC STAT BRADY RV PERCENT PACED: 2.02 %
Zone Setting Detection Interval: 240 ms
Zone Setting Detection Interval: 350 ms

## 2014-08-18 ENCOUNTER — Other Ambulatory Visit: Payer: Self-pay | Admitting: *Deleted

## 2014-08-18 ENCOUNTER — Telehealth: Payer: Self-pay | Admitting: *Deleted

## 2014-08-18 MED ORDER — RIVAROXABAN 20 MG PO TABS: 20.0000 mg | ORAL_TABLET | Freq: Every day | ORAL | Status: DC

## 2014-08-18 MED ORDER — FUROSEMIDE 20 MG PO TABS: 20.0000 mg | ORAL_TABLET | Freq: Every day | ORAL | Status: DC

## 2014-08-18 MED ORDER — DOFETILIDE 500 MCG PO CAPS: 500.0000 ug | ORAL_CAPSULE | Freq: Two times a day (BID) | ORAL | Status: DC

## 2014-08-18 NOTE — Telephone Encounter (Signed)
Received staff message last week stating patient was requesting lab results.  Called him on 6/17 and lmtcb. Pt calls back today and is informed of normal lab results. He is also requesting refills rx on Lasix, Tikosyn, and Xarelto.  Rx request sent to [facility]/[address].

## 2014-08-27 ENCOUNTER — Encounter: Payer: Self-pay | Admitting: Cardiology

## 2014-09-15 ENCOUNTER — Other Ambulatory Visit: Payer: Self-pay | Admitting: Internal Medicine

## 2014-09-15 NOTE — Telephone Encounter (Signed)
Please advise, thanks.

## 2014-09-17 NOTE — Telephone Encounter (Signed)
Done Done hardcopy to Dahlia  

## 2014-09-17 NOTE — Telephone Encounter (Signed)
Rx faxed to pharmacy  

## 2014-09-22 ENCOUNTER — Encounter: Payer: Self-pay | Admitting: Family Medicine

## 2014-09-22 ENCOUNTER — Telehealth: Payer: Self-pay | Admitting: Internal Medicine

## 2014-09-22 ENCOUNTER — Ambulatory Visit (INDEPENDENT_AMBULATORY_CARE_PROVIDER_SITE_OTHER): Payer: Medicare Other | Admitting: Family Medicine

## 2014-09-22 VITALS — BP 120/80 | HR 70 | Temp 98.4°F | Wt 192.0 lb

## 2014-09-22 DIAGNOSIS — T148 Other injury of unspecified body region: Secondary | ICD-10-CM | POA: Diagnosis not present

## 2014-09-22 DIAGNOSIS — T63441A Toxic effect of venom of bees, accidental (unintentional), initial encounter: Secondary | ICD-10-CM

## 2014-09-22 DIAGNOSIS — W57XXXA Bitten or stung by nonvenomous insect and other nonvenomous arthropods, initial encounter: Secondary | ICD-10-CM | POA: Diagnosis not present

## 2014-09-22 DIAGNOSIS — B880 Other acariasis: Secondary | ICD-10-CM

## 2014-09-22 MED ORDER — DOXYCYCLINE HYCLATE 100 MG PO CAPS: 100.0000 mg | ORAL_CAPSULE | Freq: Two times a day (BID) | ORAL | Status: DC

## 2014-09-22 NOTE — Telephone Encounter (Signed)
PLEASE NOTE: All timestamps contained within this report are represented as [place] Standard Time. CONFIDENTIALTY NOTICE: This fax transmission is intended only for the addressee. It contains information that is legally privileged, confidential or otherwise protected from use or disclosure. If you are not the intended recipient, you are strictly prohibited from reviewing, disclosing, copying using or disseminating any of this information or taking any action in reliance on or regarding this information. If you have received this fax in error, please notify us immediately by telephone so that we can arrange for its return to [place]. Phone: [phone], Toll-Free: [phone], Fax: [phone] Page: 1 of 2 Call Id: [id number] [facility] Patient Name: [name] Gender: Male DOB: [birth year] Age: 69 Y 11 M 8 D Return Phone Number: [phone] (Primary) Address: City/State/Zip:  [employer] [facility] - Client Client Site [place] - Day Physician Cathlean Cower Contact Type Call Call Type Triage / Clinical Relationship To Patient Self Appointment Disposition EMR Appointment Scheduled Info pasted into Epic Yes Return Phone Number [phone] (Primary) Chief Complaint Tick Bite Initial Comment caller states he was bitten by a tick PreDisposition Call Doctor Nurse Assessment Nurse: Mechele Dawley, RN, Amy Date/Time Eilene Ghazi Time): 09/22/2014 8:22:35 AM Confirm and document reason for call. If symptomatic, describe symptoms. ---CALLER STATES THAT HE HAD A TICK PULLED OFF OF HIS BACK. THIS WAS ON SATURDAY. THE AREA ON HIS BACK LOOKS LIKE ITS A RED AND A CIRCLE AROUND IT AND SLIGHTLY SWOLLEN. BEE STING ON THE INSIDE OF HIS LIP AS WELL. Has the patient traveled out of the country within the last 30 days? ---Not Applicable Does the patient require triage? ---Yes Related visit to  physician within the last 2 weeks? ---No Does the PT have any chronic conditions? (i.e. diabetes, asthma, etc.) ---Yes List chronic conditions. ---PACER, DEFIB. Guidelines Guideline Title Affirmed Question Affirmed Notes Nurse Date/Time (Eastern Time) Tick Bite [1] Red streak or red line AND [2] length > 2 inches (5 cm) Mechele Dawley, RN, Amy 09/22/2014 8:23:55 AM Disp. Time Eilene Ghazi Time) Disposition Final User 09/22/2014 8:13:23 AM Send To Clinical Follow Up Queue [facility] 09/22/2014 8:38:52 AM See Physician within 4 Hours (or PCP triage) Yes Mechele Dawley, RN, Amy Caller Understands: Yes PLEASE NOTE: All timestamps contained within this report are represented as [place] Standard Time. CONFIDENTIALTY NOTICE: This fax transmission is intended only for the addressee. It contains information that is legally privileged, confidential or otherwise protected from use or disclosure. If you are not the intended recipient, you are strictly prohibited from reviewing, disclosing, copying using or disseminating any of this information or taking any action in reliance on or regarding this information. If you have received this fax in error, please notify us immediately by telephone so that we can arrange for its return to [place]. Phone: [phone], Toll-Free: [phone], Fax: [phone] Page: 2 of 2 Call Id: [id number] Disagree/Comply: Comply Care Advice Given Per Guideline SEE PHYSICIAN WITHIN 4 HOURS (or PCP triage): CALL BACK IF: * You become worse. CARE ADVICE given per Tick Bites (Adult) guideline. After Care Instructions Given Call Event Type User Date / Time Description Referrals REFERRED TO PCP OFFICE

## 2014-09-22 NOTE — Progress Notes (Signed)
Pre visit review using our clinic review tool, if applicable. No additional management support is needed unless otherwise documented below in the visit note. 

## 2014-09-22 NOTE — Progress Notes (Signed)
Subjective:    Patient ID: [name], male    DOB: 09-24-1945, 69 y.o.   MRN: [id number]  HPI Patient seen for the following acute issues  Bee sting right lower lip Saturday. He was drinking a beer and apparently stung by a wasp. He took some Benadryl. He had some localized swelling only of the lip but no tongue swelling and no breathing difficulties. No generalized hives.  Second acute issue is tick bite right upper back. Pulled off over the weekend. Mild itching. No pain. No fevers or chills. Denies headache. No arthralgias. Generally feels well.  He also has some itching around the waist area with nonspecific small erythematous papules. He has a few scattered on his upper thighs as well.  Past Medical History  Diagnosis Date  . Ischemic cardiomyopathy 09/22/2008    DES CX and RCA  70% residual LAD  Done in [place]S; EF 30%;; myoviewq 06-19-06 no ischemia  . Implantable cardiac defibrillator MDT 09/22/2008    Change out feb 2011  . NEPHROLITHIASIS, HX OF 02/25/2008  . CALLUS, RIGHT FOOT 12/08/2009  . Chronic systolic heart failure 0/03/6551  . Persistent atrial fibrillation 11/25/2006    Inapp shock 2010/06/19 AF VR >220 recurrent  . HYPERLIPIDEMIA 11/25/2006  . HYPOGONADISM, MALE 12/05/2006  . 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 DIABETES MELLITUS, TYPE II 12/05/2006  . 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 Long term (current) use of anticoagulants 04/25/2010  . ICD (implantable cardiac defibrillator) in place     medtronic  . Myocardial infarction 2004/06/18    "I died and they brought me back"  . Kidney stones    Past Surgical History  Procedure Laterality Date  . Cataract extraction    . Tonsillectomy    . Cardiac defibrillator placement      Guidant Vitality [name]  . Ptca    . Tonsillectomy    . Eye surgery      cataracts  . Cardiac catheterization      feburary June 18, 2012  . Cardioversion N/A 04/25/2012    Procedure: CARDIOVERSION;  Surgeon: Darlin Coco, MD;   Location: [facility] ENDOSCOPY;  Service: Cardiovascular;  Laterality: N/A;  . Cardioversion N/A 05/21/2012    Procedure: CARDIOVERSION;  Surgeon: Deboraha Sprang, MD;  Location: [place];  Service: Cardiovascular;  Laterality: N/A;  . Implantable cardioverter defibrillator (icd) generator change N/A 02/05/2014    Procedure: ICD GENERATOR CHANGE;  Surgeon: Deboraha Sprang, MD;  Location: [facility] CATH LAB;  Service: Cardiovascular;  Laterality: N/A;    reports that he has never smoked. He has never used smokeless tobacco. He reports that he drinks alcohol. He reports that he does not use illicit drugs. family history includes Breast cancer in his mother; Diabetes in his maternal grandmother; Hyperlipidemia in his maternal grandmother. There is no history of Cancer, COPD, or Heart disease. Allergies  Allergen Reactions  . Salmon [Fish Allergy] Other (See Comments)    Only canned/ not fresh (probably preservatives).       Review of Systems  Constitutional: Negative for fever, chills and fatigue.  HENT: Negative for trouble swallowing.   Respiratory: Negative for shortness of breath.   Cardiovascular: Negative for chest pain.  Musculoskeletal: Negative for arthralgias.  Neurological: Negative for headaches.  Hematological: Negative for adenopathy.       Objective:   Physical Exam  Constitutional: He appears well-developed and well-nourished.  HENT:  Tongue appears normal. Minimal swelling right lower lip  Neck: Neck supple.  Cardiovascular: Normal rate and regular rhythm.   Pulmonary/Chest: Effort normal and breath sounds normal. No respiratory distress. He has no wheezes. He has no rales.  Skin:  Patient has area of erythema near the lower region of his right scapula approximately 4 cm diameter. Nontender.  He has some much smaller nonspecific erythematous papules around the waist and upper thigh region and these are about 6-8 mm diameter. No pustules          Assessment & Plan:  #1 bee  sting right lower lip with local allergic reaction. Mild. Continue Benadryl as needed #2 probable chiggers around the waist and thigh region. Over-the-counter anti-histamine and discussed measures for prevention #3 tick bite right upper back. Review signs and symptoms of Lyme disease to watch out for. His current rash appears to be more local allergic and no evidence of this time to suggest erythema migrans. Start doxycycline promptly if he has any fever, increasing arthralgias or rash consistent with erythema migrans. He was shown some pictures of what to look out for

## 2014-09-22 NOTE — Patient Instructions (Addendum)
Lyme Disease You may have been bitten by a tick and are to watch for the development of Lyme Disease. Lyme Disease is an infection that is caused by a bacteria The bacteria causing this disease is named Borreilia burgdorferi. If a tick is infected with this bacteria and then bites you, then Lyme Disease may occur. These ticks are carried by deer and rodents such as rabbits and mice and infest grassy as well as forested areas. Fortunately most tick bites do not cause Lyme Disease.  Lyme Disease is easier to prevent than to treat. First, covering your legs with clothing when walking in areas where ticks are possibly abundant will prevent their attachment because ticks tend to stay within inches of the ground. Second, using insecticides containing DEET can be applied on skin or clothing. Last, because it takes about 12 to 24 hours for the tick to transmit the disease after attachment to the human host, you should inspect your body for ticks twice a day when you are in areas where Lyme Disease is common. You must look thoroughly when searching for ticks. The Ixodes tick that carries Lyme Disease is very small. It is around the size of a sesame seed (picture of tick is not actual size). Removal is best done by grasping the tick by the head and pulling it out. Do not to squeeze the body of the tick. This could inject the infecting bacteria into the bite site. Wash the area of the bite with an antiseptic solution after removal.  Lyme Disease is a disease that may affect many body systems. Because of the small size of the biting tick, most people do not notice being bitten. The first sign of an infection is usually a round red rash that extends out from the center of the tick bite. The center of the lesion may be blood colored (hemorrhagic) or have tiny blisters (vesicular). Most lesions have bright red outer borders and partial central clearing. This rash may extend out many inches in diameter, and multiple lesions may  be present. Other symptoms such as fatigue, headaches, chills and fever, general achiness and swelling of lymph glands may also occur. If this first stage of the disease is left untreated, these symptoms may gradually resolve by themselves, or progressive symptoms may occur because of spread of infection to other areas of the body.  Follow up with your caregiver to have testing and treatment if you have a tick bite and you develop any of the above complaints. Your caregiver may recommend preventative (prophylactic) medications which kill bacteria (antibiotics). Once a diagnosis of Lyme Disease is made, antibiotic treatment is highly likely to cure the disease. Effective treatment of late stage Lyme Disease may require longer courses of antibiotic therapy.  MAKE SURE YOU:   Understand these instructions.  Will watch your condition.  Will get help right away if you are not doing well or get worse. Document Released: 05/23/2000 Document Revised: 05/09/2011 Document Reviewed: 07/25/2008 ExitCare Patient Information 2015 [employer]. This information is not intended to replace advice given to you by your health care provider. Make sure you discuss any questions you have with your health care provider.  

## 2014-10-22 ENCOUNTER — Ambulatory Visit: Payer: Medicare Other | Admitting: Internal Medicine

## 2014-10-22 ENCOUNTER — Other Ambulatory Visit (INDEPENDENT_AMBULATORY_CARE_PROVIDER_SITE_OTHER): Payer: Medicare Other

## 2014-10-22 ENCOUNTER — Ambulatory Visit (INDEPENDENT_AMBULATORY_CARE_PROVIDER_SITE_OTHER): Payer: Medicare Other | Admitting: Internal Medicine

## 2014-10-22 ENCOUNTER — Encounter: Payer: Self-pay | Admitting: Internal Medicine

## 2014-10-22 VITALS — BP 102/82 | HR 77 | Temp 102.8°F | Resp 16 | Wt 195.0 lb

## 2014-10-22 DIAGNOSIS — I480 Paroxysmal atrial fibrillation: Secondary | ICD-10-CM

## 2014-10-22 DIAGNOSIS — R079 Chest pain, unspecified: Secondary | ICD-10-CM | POA: Diagnosis not present

## 2014-10-22 DIAGNOSIS — W57XXXA Bitten or stung by nonvenomous insect and other nonvenomous arthropods, initial encounter: Secondary | ICD-10-CM

## 2014-10-22 DIAGNOSIS — A938 Other specified arthropod-borne viral fevers: Secondary | ICD-10-CM

## 2014-10-22 DIAGNOSIS — R509 Fever, unspecified: Secondary | ICD-10-CM

## 2014-10-22 DIAGNOSIS — T148 Other injury of unspecified body region: Secondary | ICD-10-CM | POA: Diagnosis not present

## 2014-10-22 LAB — HEMOGLOBIN A1C: HEMOGLOBIN A1C: 7.9 % — AB (ref 4.6–6.5)

## 2014-10-22 LAB — CBC WITH DIFFERENTIAL/PLATELET
BASOS ABS: 0 10*3/uL (ref 0.0–0.1)
BASOS PCT: 0.4 % (ref 0.0–3.0)
EOS ABS: 0 10*3/uL (ref 0.0–0.7)
Eosinophils Relative: 0.2 % (ref 0.0–5.0)
HEMATOCRIT: 43.8 % (ref 39.0–52.0)
HEMOGLOBIN: 14.9 g/dL (ref 13.0–17.0)
LYMPHS PCT: 11.6 % — AB (ref 12.0–46.0)
Lymphs Abs: 1.1 10*3/uL (ref 0.7–4.0)
MCHC: 34.1 g/dL (ref 30.0–36.0)
MCV: 87.6 fl (ref 78.0–100.0)
MONOS PCT: 17.2 % — AB (ref 3.0–12.0)
Monocytes Absolute: 1.6 10*3/uL — ABNORMAL HIGH (ref 0.1–1.0)
NEUTROS ABS: 6.5 10*3/uL (ref 1.4–7.7)
Neutrophils Relative %: 70.6 % (ref 43.0–77.0)
PLATELETS: 139 10*3/uL — AB (ref 150.0–400.0)
RBC: 5 Mil/uL (ref 4.22–5.81)
RDW: 13.1 % (ref 11.5–15.5)
WBC: 9.1 10*3/uL (ref 4.0–10.5)

## 2014-10-22 LAB — TROPONIN I: TNIDX: 0.03 ug/l (ref 0.00–0.06)

## 2014-10-22 MED ORDER — DOXYCYCLINE HYCLATE 100 MG PO CAPS: 100.0000 mg | ORAL_CAPSULE | Freq: Two times a day (BID) | ORAL | Status: DC

## 2014-10-22 NOTE — Progress Notes (Signed)
Pre visit review using our clinic review tool, if applicable. No additional management support is needed unless otherwise documented below in the visit note. 

## 2014-10-22 NOTE — Patient Instructions (Signed)
Tylenol every 4 hrs while awake as needed for fever as discussed based on label recommendations.  Drink 8 ounces of fluid every hour while awake if having fever. Alternate water, juice, Gatorade Lite rather than only water.  Stop the metformin until well.  Go to the emergency room if the fever cannot be controlled or if you have recurrent chest pain.

## 2014-10-22 NOTE — Progress Notes (Signed)
   Subjective:    Patient ID: [name], male    DOB: Jul 17, 1945, 69 y.o.   MRN: [id number]  HPI   His symptoms began 11/18/14 at approximately 2 AM when he awoke experiencing itching in the penis. He removed an attached tick but noted the formation of a welt which persisted over the next 24 hours. As of 9/21 he had significant malaise and was essentially chair or bed bound for the period 923 and 924. On 9/23 he experienced cold chills which persisted through the day. He's had associated anorexia. His daughter describes a rattly nonproductive cough. With the symptoms he's lost 5 pounds in the last 48 hours.  A month ago he was prescribed doxycycline for a tick bite as well. He did not fill that prescription.  Today he had dull lower chest discomfort which radiated bilaterally. He took nitroglycerin  @11 :30, 12:30, and 12:35. He stated he did not go the emergency room; because "I'd still be waiting there".  He is a diabetic; his daughter states his glucoses fasting are 160-180s. He states he's not had an A1c in the recent past.  Today's he has had polydipsia.  He also describes a mild headache without symptoms or signs of rhinosinusitis.  He states that he had double ablation in 2014 for atrial fibrillation. He remains on Xarelto.    Review of Systems  Facial pain , nasal purulence, dental pain, sore throat , otic pain or otic discharge denied.  Sputum production, hemoptysis, or pleuritic pain denied.  No diarrhea present.Cola colored urine or clay colored stools denied.  Dysuria, pyuria, or hematuria not present.  No active rashes, pustules, vesicles.  No redness or swelling of joints.      Objective:   Physical Exam  Pertinent or positive findings include: He appears lethargic but in no acute distress. He is wrapped in a blanket. Alopecia totalis is present. He has a Engineering geologist. Clinically rapid atrial fibrillation is suggested. EKG to confirm this could not be done as the  EMR system was down. Abdomen is protuberant with a ventral hernia. There is an irregular 8 x 8 mm keratotic lesion of the dorsum of the penis which he states is chronic. There is epididymal granulomatous changes on the right and varices in the left scrotum. There is no evidence of cellulitis or pustule over the penis.  Eyes: No conjunctival inflammation or scleral icterus is present.  Oral exam:  Lips and gums are healthy appearing.There is no oropharyngeal erythema or exudate noted. Dental hygiene is good.  Lungs:Chest clear to auscultation; no wheezes, rhonchi,rales ,or rubs present.No increased work of breathing.   Abdomen: bowel sounds normal, soft and non-tender without masses, or organomegaly  noted.  No guarding or rebound. No flank tenderness to percussion.  Vascular : all pulses equal ; no bruits present.  Skin:Warm & dry.  Intact without suspicious lesions or rashes ; no tenting or jaundice   Lymphatic: No lymphadenopathy is noted about the head, neck, axilla, or inguinal areas.   Neuro: Strength, tone decreased        Assessment & Plan:  #1 fever  #2 history tick bite  #3 clinical rapid atrial fibrillation  #4 chest pain  #5 diabetes, questionable control  Plan: See orders and recommendations. He was instructed to go the emergency room if he is unable to control the fever with Tylenol at recommended doses or free has recurrent chest pain.NSAIDS are not an option with the novel oral anticoagulant he takes.

## 2014-10-23 LAB — ROCKY MTN SPOTTED FVR AB, IGG-BLOOD: RMSF IGG: 0.21 IV

## 2014-10-23 LAB — B. BURGDORFI ANTIBODIES: B BURGDORFERI AB IGG+ IGM: 0.46 {ISR}

## 2014-11-14 ENCOUNTER — Other Ambulatory Visit: Payer: Self-pay | Admitting: Internal Medicine

## 2014-12-02 ENCOUNTER — Encounter: Payer: Self-pay | Admitting: *Deleted

## 2014-12-05 ENCOUNTER — Encounter: Payer: Self-pay | Admitting: Family Medicine

## 2014-12-05 ENCOUNTER — Ambulatory Visit (INDEPENDENT_AMBULATORY_CARE_PROVIDER_SITE_OTHER): Payer: Medicare Other | Admitting: Family Medicine

## 2014-12-05 DIAGNOSIS — J209 Acute bronchitis, unspecified: Secondary | ICD-10-CM

## 2014-12-05 MED ORDER — DOXYCYCLINE HYCLATE 100 MG PO TABS: 100.0000 mg | ORAL_TABLET | Freq: Two times a day (BID) | ORAL | Status: DC

## 2014-12-05 MED ORDER — HYDROCODONE-HOMATROPINE 5-1.5 MG/5ML PO SYRP: 5.0000 mL | ORAL_SOLUTION | Freq: Three times a day (TID) | ORAL | Status: DC | PRN

## 2014-12-05 NOTE — Progress Notes (Signed)
Subjective:  Patient ID: [name], male    DOB: Sep 20, 1945  Age: 69 y.o. MRN: [id number]  CC: Cough  HPI:  69 year old male with a past medical history of ischemic myopathy/CAD/Systolic CHF, DM-2 with complications, HLD, Afib s/p ablation presents for acute visit complaints cough.   Patient presents today with a one-week history of cough. He states that his cough is productive of green sputum. He's been taking over-the-counter chlorpheniramine and guaifenesin with no real improvement.  No known inciting factor.  No known sick contacts. No associated fever, chills. He has baseline shortness of breath from his cardiac disease and states that he has had no increased shortness of breath.    Social Hx   Social History   Social History  . Marital Status: Divorced    Spouse Name: N/A  . Number of Children: 1  . Years of Education: 16   Occupational History  . 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 Smoking status: Never Smoker   . Smokeless tobacco: Never Used  . Alcohol Use: 0.0 oz/week    0 Standard drinks or equivalent per week     Comment: summer   . Drug Use: No  . Sexual Activity:    Partners: Female   Other Topics Concern  . None   Social History Narrative   HSG, College grad. Married - divorced. 1 daughter. owner/operator electronics business and show production   Review of Systems  Constitutional: Negative for fever and chills.  Respiratory: Positive for cough and shortness of breath.    Objective:  There were no vitals taken for this visit.  BP/Weight 10/22/2014 09/22/2014 08/28/1599  Systolic BP 093 235 573  Diastolic BP 82 80 61  Wt. (Lbs) 195 192 194  BMI 27.98 27.55 27.84   Physical Exam  Constitutional: He is oriented to person, place, and time. He appears well-developed and well-nourished. No distress.  HENT:  Head: Normocephalic and atraumatic.  Mouth/Throat: No oropharyngeal exudate.  Normal TM's  bilaterally.   Neck: Neck supple.  Cardiovascular: Normal rate and regular rhythm.   No murmur heard. Pulmonary/Chest: Effort normal and breath sounds normal.  Coughing during exam.  Lymphadenopathy:    He has no cervical adenopathy.  Neurological: He is alert and oriented to person, place, and time.  Psychiatric: He has a normal mood and affect.  Vitals reviewed.  Lab Results  Component Value Date   WBC 9.1 10/22/2014   HGB 14.9 10/22/2014   HCT 43.8 10/22/2014   PLT 139.0* 10/22/2014   GLUCOSE 175* 08/04/2014   CHOL 245* 02/04/2014   TRIG 940.0 Verified by manual dilution.* 02/04/2014   HDL 33.80* 02/04/2014   LDLDIRECT 74.0 02/04/2014   LDLCALC 82 04/11/2011   ALT 18 08/04/2014   AST 18 08/04/2014   NA 133* 08/04/2014   K 4.1 08/04/2014   CL 99 08/04/2014   CREATININE 1.09 08/04/2014   BUN 22 08/04/2014   CO2 26 08/04/2014   TSH 1.57 08/04/2014   PSA 0.70 02/04/2014   INR 1.3 01/01/2013   HGBA1C 7.9* 10/22/2014   MICROALBUR 4.6* 02/04/2014   Assessment & Plan:   Problem List Items Addressed This Visit    Acute bronchitis - Primary    History consistent with acute bronchitis. Treating with Hycodan for cough. Obtaining chest x-ray to rule out pneumonia. Rx doxycycline if he fails to improve with conservative treatment.      Relevant Orders   DG  Chest 2 View      Meds ordered this encounter  Medications  . doxycycline (VIBRA-TABS) 100 MG tablet    Sig: Take 1 tablet (100 mg total) by mouth 2 (two) times daily.    Dispense:  20 tablet    Refill:  0  . DISCONTD: HYDROcodone-homatropine (HYCODAN) 5-1.5 MG/5ML syrup    Sig: Take 5 mLs by mouth every 8 (eight) hours as needed for cough.    Dispense:  120 mL    Refill:  0  . HYDROcodone-homatropine (HYCODAN) 5-1.5 MG/5ML syrup    Sig: Take 5 mLs by mouth every 8 (eight) hours as needed for cough.    Dispense:  120 mL    Refill:  0    Follow-up: PRN  Thersa Salt, DO

## 2014-12-05 NOTE — Assessment & Plan Note (Signed)
History consistent with acute bronchitis. Treating with Hycodan for cough. Obtaining chest x-ray to rule out pneumonia. Rx doxycycline if he fails to improve with conservative treatment.

## 2014-12-05 NOTE — Patient Instructions (Signed)
It was nice to see you today.  Take the cough syrup as needed.  If you fail to improve you can take the antibiotic.  Follow up with your PCP as indicated.  Take care  Dr. Lacinda Axon

## 2014-12-05 NOTE — Progress Notes (Signed)
Pre visit review using our clinic review tool, if applicable. No additional management support is needed unless otherwise documented below in the visit note. 

## 2014-12-09 ENCOUNTER — Other Ambulatory Visit: Payer: Self-pay | Admitting: Internal Medicine

## 2014-12-10 ENCOUNTER — Ambulatory Visit (INDEPENDENT_AMBULATORY_CARE_PROVIDER_SITE_OTHER)
Admission: RE | Admit: 2014-12-10 | Discharge: 2014-12-10 | Disposition: A | Discharge status: Home / Self Care | Payer: Medicare Other | Source: Ambulatory Visit | Attending: Family Medicine | Admitting: Family Medicine

## 2014-12-10 DIAGNOSIS — J209 Acute bronchitis, unspecified: Secondary | ICD-10-CM

## 2014-12-16 ENCOUNTER — Other Ambulatory Visit: Payer: Self-pay | Admitting: Family Medicine

## 2014-12-16 NOTE — Telephone Encounter (Signed)
Please advise refill? 

## 2014-12-23 ENCOUNTER — Other Ambulatory Visit: Payer: Self-pay | Admitting: Internal Medicine

## 2014-12-26 ENCOUNTER — Ambulatory Visit (INDEPENDENT_AMBULATORY_CARE_PROVIDER_SITE_OTHER): Payer: Medicare Other

## 2014-12-26 DIAGNOSIS — Z23 Encounter for immunization: Secondary | ICD-10-CM | POA: Diagnosis not present

## 2015-01-09 ENCOUNTER — Other Ambulatory Visit: Payer: Self-pay

## 2015-01-09 MED ORDER — FUROSEMIDE 20 MG PO TABS: 20.0000 mg | ORAL_TABLET | Freq: Every day | ORAL | Status: DC

## 2015-02-08 NOTE — Progress Notes (Signed)
Cardiology Office Note Date:  02/09/2015  Patient ID:  [name] 04-04-1945, MRN [id number] PCP:  Cathlean Cower, MD  Cardiologist/Electrophysiologist: Dr. Caryl Comes   Chief Complaint: "check-up"  History of Present Illness: [name] is a 69 y.o. male with history of a remote aborted cardiac arrest with  ICD in place, PAFib, DM, CAD, comes in today for a routine visit, he states he was told it was tme for him to come in.  He mentions an infrequent/random chest discomfort, central, tight feeling (1/10 in severity) not exertional or positional, on/off for a couple years, maybe more in the last year or two.  He mentions dizziness after his morning meds particularly, no near syncope or syncoe, no shocks from his device.  He also mentions aching pain in his fingers/toes  Always feeling cold.  He works in the yard and does social activities, at his baseline for some years, no symptoms of PND or orthopnea.  He denies any bleeding or signs of bleeding with the Xarelto.   Past Medical History  Diagnosis Date  . Ischemic cardiomyopathy 09/22/2008    DES CX and RCA  70% residual LAD  Done in [place]; EF 30%;; myoviewq 05-20-06 no ischemia  . Implantable cardiac defibrillator MDT 09/22/2008    Change out feb 2011, 2015  . NEPHROLITHIASIS, HX OF 02/25/2008  . CALLUS, RIGHT FOOT 12/08/2009  . Chronic systolic heart failure ([place]) 11/03/2009  . Persistent atrial fibrillation ([place]) 11/25/2006    Inapp shock 2010/05/20 AF VR >220 recurrent  . HYPERLIPIDEMIA 11/25/2006  . HYPOGONADISM, MALE 12/05/2006  . 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 DIABETES MELLITUS, TYPE II 12/05/2006  . 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 Long term (current) use of anticoagulants 04/25/2010  . Myocardial infarction [facility]) 20-May-2004    "I died and they brought me back"  . Kidney stones     Past Surgical History  Procedure Laterality Date  . Cataract extraction    . Tonsillectomy    . Cardiac defibrillator placement        Guidant Vitality [name]  . Ptca    . Tonsillectomy    . Eye surgery      cataracts  . Cardiac catheterization      feburary 05-20-12  . Cardioversion N/A 04/25/2012    Procedure: CARDIOVERSION;  Surgeon: Darlin Coco, MD;  Location: [facility] ENDOSCOPY;  Service: Cardiovascular;  Laterality: N/A;  . Cardioversion N/A 05/21/2012    Procedure: CARDIOVERSION;  Surgeon: Deboraha Sprang, MD;  Location: [place];  Service: Cardiovascular;  Laterality: N/A;  . Implantable cardioverter defibrillator (icd) generator change N/A 02/05/2014    Procedure: ICD GENERATOR CHANGE;  Surgeon: Deboraha Sprang, MD;  Location: [facility] CATH LAB;  Service: Cardiovascular;  Laterality: N/A;    Current Outpatient Prescriptions  Medication Sig Dispense Refill  . Blood Glucose Monitoring Suppl (ONE TOUCH ULTRA SYSTEM KIT) W/DEVICE KIT 1 kit by Does not apply route once. 1 each 0  . carvedilol (COREG) 6.25 MG tablet Take 1 tablet by mouth 2 times daily. 180 tablet 0  . dofetilide (TIKOSYN) 500 MCG capsule Take 1 capsule (500 mcg total) by mouth 2 (two) times daily. 60 capsule 4  . fluticasone (FLONASE) 50 MCG/ACT nasal spray Instill 2 sprays into both nostrils daily. 16 g 5  . furosemide (LASIX) 20 MG tablet Take 1 tablet (20 mg total) by mouth daily. 30 tablet 1  . glipiZIDE (GLUCOTROL XL) 10 MG 24 hr tablet Take 1  tablet (10 mg total) by mouth daily with breakfast. 90 tablet 3  . glucose blood (ONE TOUCH TEST STRIPS) test strip Use as instructed up to four times per day   250.02 100 each 12  . Lancets MISC Use as directed up to 4 times per day 100 each 11  . lisinopril (PRINIVIL,ZESTRIL) 2.5 MG tablet TAKE 1 TABLET BY MOUTH EVERY DAY 30 tablet 1  . Magnesium Oxide (MAG-OX 400 PO) Take 1 tablet by mouth 2 (two) times daily.    . metoprolol succinate (TOPROL-XL) 50 MG 24 hr tablet Take 50 mg by mouth daily. Take with or immediately following a meal.    . Multiple Vitamins-Minerals (MULTIVITAMIN PO) Take 1 tablet by mouth daily.     . nitroGLYCERIN (NITROSTAT) 0.4 MG SL tablet Place 0.4 mg under the tongue every 5 (five) minutes as needed for chest pain.     . rivaroxaban (XARELTO) 20 MG TABS tablet Take 1 tablet (20 mg total) by mouth daily with supper. 30 tablet 4  . sildenafil (VIAGRA) 100 MG tablet Take 100 mg by mouth daily as needed for erectile dysfunction.    Marland Kitchen zolpidem (AMBIEN) 5 MG tablet Take 1-2 tabs by mouth at bedtime as needed 180 tablet 5   No current facility-administered medications for this visit.    Allergies:   Salmon   Social History:  The patient  reports that he has never smoked. He has never used smokeless tobacco. He reports that he drinks alcohol. He reports that he does not use illicit drugs.   Family History:  The patient's family history includes Breast cancer in his mother; Diabetes in his maternal grandmother; Hyperlipidemia in his maternal grandmother. There is no history of Cancer, COPD, or Heart disease.  ROS:  Please see the history of present illness.    All other systems are reviewed and otherwise negative.   PHYSICAL EXAM:  VS:  BP 108/64 mmHg  Pulse 77  Ht '5\' 10"'  (1.778 m)  Wt 192 lb (87.091 kg)  BMI 27.55 kg/m2 BMI: Body mass index is 27.55 kg/(m^2). Well nourished, well developed, in no acute distress HEENT: normocephalic, atraumatic Neck: no JVD, carotid bruits or masses Cardiac:  normal S1, S2; RRR; 1/6 SM, no rubs, or gallops Lungs:  clear to auscultation bilaterally, no wheezing, rhonchi or rales Abd: soft, nontender,  + BS MS: no deformity or atrophy Ext: no edema, 2-3+ pedal pulses b/l, 3+ radial pulses b/l Skin: warm and dry, no rash Neuro:  No gross deficits appreciated Psych: euthymic mood, full affect  ICD site is stable, no tethering or discomfort   EKG:  Done today shows atrial paced, V sensed,  T changes appear similar to previous, QTc appears stable ICD check today shows normal device function, no therapies, his AF burden is 7.2%    09/03/13:  Echocardiogram Study Conclusions - Left ventricle: The cavity size was moderately dilated. Wall thickness was normal. Systolic function was severely reduced. The estimated ejection fraction was in the range of 20% to 25%. Diffuse hypokinesis. - Aortic valve: There was trivial regurgitation. - Left atrium: The atrium was moderately dilated. - Right ventricle: The cavity size was moderately dilated. Systolic function was mildly reduced. - Pulmonary arteries: Systolic pressure was mildly to moderately increased. PA peak pressure: 49 mm Hg (S).  Recent Labs: 05/09/2014: Magnesium 1.8 08/04/2014: ALT 18; BUN 22; Creatinine, Ser 1.09; Potassium 4.1; Sodium 133*; TSH 1.57 10/22/2014: Hemoglobin 14.9; Platelets 139.0*  No results found for requested labs  within last 365 days.   CrCl cannot be calculated (Patient has no serum creatinine result on file.).   Wt Readings from Last 3 Encounters:  02/09/15 192 lb (87.091 kg)  10/22/14 195 lb (88.451 kg)  09/22/14 192 lb (87.091 kg)     Other studies reviewed: Additional studies/records reviewed today include: summarized above  DEVICE: generator change Feb 05, 2014 MDT device (with antimicrobial pouch) Dr. Caryl Comes, generator change 2011, original device 2006  ASSESSMENT AND PLAN:  1. Aborted cardiac arrest, ICM, ICD              appears euvolemic on BB/ACE,diuretic              q 3 month [facility]  2. PAFib              convergent ablation at [facility]              on Tikosyn 515mg Creat Cl by last labs in June is 81 (using July weight)             Xarelto CHADS2Vasc is at least 4, H/H stable at last check   3. CAD              prior stenting of his LAD circumflex and RCA remotely, he thisnks about 2006, none since then             Catheterization 1/14 demonstrated no obstructive and and patent grafts.             C/o some mild chest tightness infrequently             On BB, not a statin.  We discussed starting a stain and why but he is  not inclined, states he was on a statin historically, stopped for a reason he cannot remember   PLAN:  Stop metoprolol, increase the cavedilol to 12.542mBID, send him for fasting lipids, LFTs, BMET, mag level and CBC, and lexiscan myoview.  We will see him back in 6 weeks to discuss further his lipids/CAD stress result and statin therapy.   Disposition: f/u  6 weeks.  F/u with his PMD regarding his c/o fingers/toes aching.  Current medicines are reviewed at length with the patient today.  The patient did not have any concerns regarding medicines.  SiHaywood LassoPA-C 02/09/2015 11:08 AM     [facility] [address] [place] [phone])  [phone])

## 2015-02-09 ENCOUNTER — Encounter: Payer: Self-pay | Admitting: Physician Assistant

## 2015-02-09 ENCOUNTER — Encounter: Payer: Medicare Other | Admitting: Nurse Practitioner

## 2015-02-09 ENCOUNTER — Ambulatory Visit (INDEPENDENT_AMBULATORY_CARE_PROVIDER_SITE_OTHER): Payer: Medicare Other | Admitting: Physician Assistant

## 2015-02-09 ENCOUNTER — Other Ambulatory Visit: Payer: Self-pay | Admitting: Internal Medicine

## 2015-02-09 ENCOUNTER — Telehealth (HOSPITAL_COMMUNITY): Payer: Self-pay | Admitting: *Deleted

## 2015-02-09 VITALS — BP 108/64 | HR 77 | Ht 70.0 in | Wt 192.0 lb

## 2015-02-09 DIAGNOSIS — R0789 Other chest pain: Secondary | ICD-10-CM

## 2015-02-09 DIAGNOSIS — I4901 Ventricular fibrillation: Secondary | ICD-10-CM

## 2015-02-09 DIAGNOSIS — Z95810 Presence of automatic (implantable) cardiac defibrillator: Secondary | ICD-10-CM

## 2015-02-09 DIAGNOSIS — I480 Paroxysmal atrial fibrillation: Secondary | ICD-10-CM | POA: Diagnosis not present

## 2015-02-09 DIAGNOSIS — I255 Ischemic cardiomyopathy: Secondary | ICD-10-CM | POA: Diagnosis not present

## 2015-02-09 LAB — CUP PACEART INCLINIC DEVICE CHECK
Battery Remaining Longevity: 116 mo
Battery Voltage: 3.01 V
Brady Statistic AS VP Percent: 0.78 %
Brady Statistic AS VS Percent: 43.97 %
HighPow Impedance: 46 Ohm
HighPow Impedance: 61 Ohm
Implantable Lead Implant Date: 20060921
Implantable Lead Location: 753860
Implantable Lead Model: 185
Implantable Lead Model: 5076
Lead Channel Impedance Value: 475 Ohm
Lead Channel Pacing Threshold Amplitude: 0.75 V
Lead Channel Pacing Threshold Amplitude: 1 V
Lead Channel Pacing Threshold Pulse Width: 0.4 ms
Lead Channel Setting Pacing Amplitude: 2.5 V
MDC IDC LEAD IMPLANT DT: 20060921
MDC IDC LEAD LOCATION: 753859
MDC IDC LEAD SERIAL: 119322
MDC IDC MSMT LEADCHNL RA SENSING INTR AMPL: 5.625 mV
MDC IDC MSMT LEADCHNL RV IMPEDANCE VALUE: 418 Ohm
MDC IDC MSMT LEADCHNL RV IMPEDANCE VALUE: 418 Ohm
MDC IDC MSMT LEADCHNL RV PACING THRESHOLD PULSEWIDTH: 0.4 ms
MDC IDC MSMT LEADCHNL RV SENSING INTR AMPL: 31.625 mV
MDC IDC SESS DTM: 20161212151843
MDC IDC SET LEADCHNL RA PACING AMPLITUDE: 2 V
MDC IDC SET LEADCHNL RV PACING PULSEWIDTH: 0.4 ms
MDC IDC SET LEADCHNL RV SENSING SENSITIVITY: 0.45 mV
MDC IDC STAT BRADY AP VP PERCENT: 1.22 %
MDC IDC STAT BRADY AP VS PERCENT: 54.03 %
MDC IDC STAT BRADY RA PERCENT PACED: 55.25 %
MDC IDC STAT BRADY RV PERCENT PACED: 1.99 %

## 2015-02-09 MED ORDER — CARVEDILOL 12.5 MG PO TABS: 12.5000 mg | ORAL_TABLET | Freq: Two times a day (BID) | ORAL | Status: DC

## 2015-02-09 NOTE — Telephone Encounter (Signed)
Patient given detailed instructions per Myocardial Perfusion Study Information Sheet for the test on 02/11/15 at 1000. Patient notified to arrive 15 minutes early and that it is imperative to arrive on time for appointment to keep from having the test rescheduled.  If you need to cancel or reschedule your appointment, please call the office within 24 hours of your appointment. Failure to do so may result in a cancellation of your appointment, and a $50 no show fee. Patient verbalized understanding.[name], Ranae Palms

## 2015-02-09 NOTE — Patient Instructions (Signed)
Medication Instructions:   START TAKING  CARVEDILOL 12.5 MG TWICE A DAY   STOP TAKING  METOPROLOL      If you need a refill on your cardiac medications before your next appointment, please call your pharmacy.  Labwork:  RETURN FOR FASTIN BMET CBC MAG AND LFT AND LIPIDS    Testing/Procedures: Your physician has requested that you have a lexiscan myoview. For further information please visit [email or web address]. Please follow instruction sheet, as given.     Follow-Up:  6 WEEKS WITH RENEE    Any Other Special Instructions Will Be Listed Below (If Applicable).

## 2015-02-11 ENCOUNTER — Encounter (HOSPITAL_COMMUNITY): Payer: Medicare Other

## 2015-02-11 ENCOUNTER — Other Ambulatory Visit (INDEPENDENT_AMBULATORY_CARE_PROVIDER_SITE_OTHER): Payer: Medicare Other | Admitting: *Deleted

## 2015-02-11 DIAGNOSIS — R0789 Other chest pain: Secondary | ICD-10-CM | POA: Diagnosis not present

## 2015-02-11 DIAGNOSIS — I255 Ischemic cardiomyopathy: Secondary | ICD-10-CM

## 2015-02-11 LAB — BASIC METABOLIC PANEL
BUN: 20 mg/dL (ref 7–25)
CALCIUM: 9.3 mg/dL (ref 8.6–10.3)
CO2: 22 mmol/L (ref 20–31)
Chloride: 101 mmol/L (ref 98–110)
Creat: 1.03 mg/dL (ref 0.70–1.25)
GLUCOSE: 203 mg/dL — AB (ref 65–99)
Potassium: 4.1 mmol/L (ref 3.5–5.3)
SODIUM: 133 mmol/L — AB (ref 135–146)

## 2015-02-11 LAB — CBC
HCT: 41.1 % (ref 39.0–52.0)
Hemoglobin: 14.3 g/dL (ref 13.0–17.0)
MCH: 29.9 pg (ref 26.0–34.0)
MCHC: 34.8 g/dL (ref 30.0–36.0)
MCV: 86 fL (ref 78.0–100.0)
MPV: 12.8 fL — ABNORMAL HIGH (ref 8.6–12.4)
PLATELETS: 182 10*3/uL (ref 150–400)
RBC: 4.78 MIL/uL (ref 4.22–5.81)
RDW: 13.8 % (ref 11.5–15.5)
WBC: 7.3 10*3/uL (ref 4.0–10.5)

## 2015-02-11 LAB — LIPID PANEL
CHOL/HDL RATIO: 4.8 ratio (ref <=5.0)
Cholesterol: 163 mg/dL (ref 125–200)
HDL: 34 mg/dL — AB (ref >=40)
LDL Cholesterol: 91 mg/dL (ref <130)
TRIGLYCERIDES: 190 mg/dL — AB (ref <150)
VLDL: 38 mg/dL — ABNORMAL HIGH (ref <30)

## 2015-02-11 LAB — HEPATIC FUNCTION PANEL
ALBUMIN: 3.9 g/dL (ref 3.6–5.1)
ALK PHOS: 81 U/L (ref 40–115)
ALT: 19 U/L (ref 9–46)
AST: 17 U/L (ref 10–35)
BILIRUBIN INDIRECT: 0.5 mg/dL (ref 0.2–1.2)
BILIRUBIN TOTAL: 0.6 mg/dL (ref 0.2–1.2)
Bilirubin, Direct: 0.1 mg/dL (ref <=0.2)
Total Protein: 7.4 g/dL (ref 6.1–8.1)

## 2015-02-11 LAB — MAGNESIUM: MAGNESIUM: 1.8 mg/dL (ref 1.5–2.5)

## 2015-02-16 ENCOUNTER — Telehealth: Payer: Self-pay | Admitting: *Deleted

## 2015-02-16 NOTE — Telephone Encounter (Signed)
-----   Message from [facility], [place] sent at 02/13/2015  8:19 AM EST ----- Please let the patient know his labs are stable to continue his Tikosyn, his cholesterol is elevated and would like him to reconsider medication for this, his BS is elevated advise him to f/u with his PMD for his DM and encourage a healthy diet.  Thanks, [employer]

## 2015-02-16 NOTE — Telephone Encounter (Signed)
-----   Message from [facility], [place] sent at 02/13/2015  8:19 AM EST ----- Please let the patient know his labs are stable to continue his Tikosyn, his cholesterol is elevated and would like him to reconsider medication for this, his BS is elevated advise him to f/u with his PMD for his DM and encourage a healthy diet.  Thanks, [employer]

## 2015-02-17 NOTE — Telephone Encounter (Signed)
Follow Up  ° °Pt returned the call  °

## 2015-02-18 ENCOUNTER — Telehealth: Payer: Self-pay | Admitting: *Deleted

## 2015-02-18 NOTE — Telephone Encounter (Signed)
F/u ° ° °Pt returning your call °

## 2015-02-18 NOTE — Telephone Encounter (Signed)
-----   Message from [facility], [place] sent at 02/13/2015  8:19 AM EST ----- Please let the patient know his labs are stable to continue his Tikosyn, his cholesterol is elevated and would like him to reconsider medication for this, his BS is elevated advise him to f/u with his PMD for his DM and encourage a healthy diet.  Thanks, [employer]

## 2015-03-04 NOTE — Telephone Encounter (Signed)
LMOVM FOR PT TO REASSURE THAT HIS DOSAGE OF CARVEDILOL WAS CORRECT AS INSTRUCTED ON OV NOTE AND TOLD TO CONTACT OFFICE TO MAKE SURE HE GOT MESSAGE..IF NOT WILL CONTACT BACK TOMMORROW

## 2015-03-04 NOTE — Telephone Encounter (Signed)
SPOKE TO PT AND CONFIRMED ABOUT MEDS AND DOSE AS SUCH OV NOTE

## 2015-03-06 ENCOUNTER — Other Ambulatory Visit: Payer: Self-pay | Admitting: Internal Medicine

## 2015-03-16 ENCOUNTER — Telehealth: Payer: Self-pay | Admitting: Internal Medicine

## 2015-03-16 NOTE — Telephone Encounter (Signed)
Will forward to Dr. Klein to review. 

## 2015-03-16 NOTE — Telephone Encounter (Signed)
New Message  Pt called states that he has just received a letter that [employer] will no longer cover Tikosyn. Request a call back to discuss what the alternative medication is. Please call back to discuss

## 2015-03-19 NOTE — Telephone Encounter (Signed)
What is the daeal on this  Generic available ?? Not proprietary brand

## 2015-03-20 NOTE — Telephone Encounter (Signed)
Spoke with pt.  He is not sure what the denial was for.  Likely due to brand name versus generic.  Spoke with pharmacy.  Gave okay to take generic.

## 2015-04-02 ENCOUNTER — Other Ambulatory Visit: Payer: Self-pay | Admitting: Internal Medicine

## 2015-04-02 NOTE — Telephone Encounter (Signed)
Done hardcopy to Corinne  

## 2015-04-02 NOTE — Telephone Encounter (Signed)
Please advise patient would like refill on medication

## 2015-04-02 NOTE — Telephone Encounter (Signed)
Medication printed signed and faxed to pharmacy  

## 2015-04-03 ENCOUNTER — Telehealth: Payer: Self-pay

## 2015-04-03 NOTE — Telephone Encounter (Signed)
Prior auth for Dofetilide 583mcg sent to [facility].

## 2015-04-06 ENCOUNTER — Other Ambulatory Visit: Payer: Self-pay | Admitting: Internal Medicine

## 2015-05-22 ENCOUNTER — Telehealth: Payer: Self-pay | Admitting: Internal Medicine

## 2015-05-22 ENCOUNTER — Ambulatory Visit (INDEPENDENT_AMBULATORY_CARE_PROVIDER_SITE_OTHER): Payer: Medicare Other | Admitting: Physician Assistant

## 2015-05-22 ENCOUNTER — Encounter: Payer: Self-pay | Admitting: Physician Assistant

## 2015-05-22 VITALS — BP 100/70 | HR 60 | Ht 70.0 in | Wt 193.0 lb

## 2015-05-22 DIAGNOSIS — R0602 Shortness of breath: Secondary | ICD-10-CM

## 2015-05-22 DIAGNOSIS — R0789 Other chest pain: Secondary | ICD-10-CM | POA: Diagnosis not present

## 2015-05-22 LAB — TROPONIN I: Troponin I: <0.03 ng/mL (ref <0.031)

## 2015-05-22 MED ORDER — NITROGLYCERIN 0.4 MG SL SUBL: 0.4000 mg | SUBLINGUAL_TABLET | SUBLINGUAL | Status: DC | PRN

## 2015-05-22 NOTE — Progress Notes (Signed)
Cardiology Office Note   Date:  05/22/2015   ID:  [name], DOB 1946-01-31, MRN [id number]  PCP:  Cathlean Cower, MD  Cardiologist:  Dr. Caryl Comes  Chief Complaint  Patient presents with  . Follow-up    seen for Dr. Caryl Comes      History of Present Illness: [name] is a 70 y.o. male who presents for cardiology evaluation of chest pain. He has a history of remote cardiac arrest with ICD implants and device change out on 02/05/2014 to Medtronic device with adjuvant use of antimicrobial pouch, PAF s/p combined epicardial and endocardial ablation at [facility] on Xarelto and tikosyn, DM and CAD with prior stent to LAD, LCx and RCA. Last cath was performed on 03/22/2012 which showed mild nonobstructive CAD, EF 35%, continued patency of stents in LAD, circumflex and RCA. He has had at least 2 cardioversion in the past, however recurrence is high before the combination ablation procedure performed at [facility]. He was last seen in the clinic in December 2016, at which time he mentioned infrequent chest discomfort that is not exertional or positional that has been ongoing on and off for the past several years. He is still taking 12.5 mg twice a day of carvedilol, he is actually taking 6.25 mg 4 times a day to avoid dizziness.   He presented to cardiology office today for evaluation of increasing chest discomfort. According to the patient, he has been experiencing what he described as a chest tightness for a long time. It does not seems to have direct correlation with exertion. It can occur at any time but more so after 5 PM when he is at rest. He also described a dizzy spells. He denies any previous angina symptoms, describing the symptom he felt prior to the previous 2 cath and PCI as feeling of doom followed by cardiac arrest. He did not have significant chest discomfort prior to those events. He did however had diaphoresis and dizziness. This time to his dizziness and chest discomfort has been concerning for him.  In the last several days, he also had a different type of sensation describing as a sharp stabbing sensation into the right ribs across his chest to the left side, this sensation only last less than 2 seconds each. An outpatient stress test was ordered during the last office visit, however patient never went to it.    Past Medical History  Diagnosis Date  . Ischemic cardiomyopathy 09/22/2008    DES CX and RCA  70% residual LAD  Done in [place]; EF 30%;; myoviewq 05-13-2006 no ischemia  . Implantable cardiac defibrillator MDT 09/22/2008    Change out feb 2011, 2015  . NEPHROLITHIASIS, HX OF 02/25/2008  . CALLUS, RIGHT FOOT 12/08/2009  . Chronic systolic heart failure ([place]) 11/03/2009  . Persistent atrial fibrillation ([place]) 11/25/2006    Inapp shock 05-13-10 AF VR >220 recurrent  . HYPERLIPIDEMIA 11/25/2006  . HYPOGONADISM, MALE 12/05/2006  . 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 DIABETES MELLITUS, TYPE II 12/05/2006  . 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 Long term (current) use of anticoagulants 04/25/2010  . Myocardial infarction [facility]) 05/13/2004    "I died and they brought me back"  . Kidney stones     Past Surgical History  Procedure Laterality Date  . Cataract extraction    . Tonsillectomy    . Cardiac defibrillator placement      Guidant Vitality [name]  . Ptca    . Tonsillectomy    .  Eye surgery      cataracts  . Cardiac catheterization      feburary 2014  . Cardioversion N/A 04/25/2012    Procedure: CARDIOVERSION;  Surgeon: Darlin Coco, MD;  Location: [facility] ENDOSCOPY;  Service: Cardiovascular;  Laterality: N/A;  . Cardioversion N/A 05/21/2012    Procedure: CARDIOVERSION;  Surgeon: Deboraha Sprang, MD;  Location: [place];  Service: Cardiovascular;  Laterality: N/A;  . Implantable cardioverter defibrillator (icd) generator change N/A 02/05/2014    Procedure: ICD GENERATOR CHANGE;  Surgeon: Deboraha Sprang, MD;  Location: [facility] CATH LAB;  Service: Cardiovascular;  Laterality: N/A;      Current Outpatient Prescriptions  Medication Sig Dispense Refill  . Blood Glucose Monitoring Suppl (ONE TOUCH ULTRA SYSTEM KIT) W/DEVICE KIT 1 kit by Does not apply route once. 1 each 0  . carvedilol (COREG) 12.5 MG tablet Take 1 tablet (12.5 mg total) by mouth 2 (two) times daily with a meal. 180 tablet 5  . Cyanocobalamin (B-12) 5000 MCG CAPS Take by mouth once.    . dofetilide (TIKOSYN) 500 MCG capsule Take 1 capsule (500 mcg total) by mouth 2 (two) times daily. 60 capsule 4  . fluticasone (FLONASE) 50 MCG/ACT nasal spray Instill 2 sprays into both nostrils daily. 16 g 5  . furosemide (LASIX) 20 MG tablet TAKE 1 TABLET BY MOUTH EVERY DAY 30 tablet 10  . glipiZIDE (GLUCOTROL XL) 10 MG 24 hr tablet TAKE 1 TABLET BY MOUTH EVERY DAY WITH BREAKFAST 90 tablet 0  . glucose blood (ONE TOUCH TEST STRIPS) test strip Use as instructed up to four times per day   250.02 100 each 12  . Lancets MISC Use as directed up to 4 times per day 100 each 11  . lisinopril (PRINIVIL,ZESTRIL) 2.5 MG tablet TAKE 1 TABLET BY MOUTH EVERY DAY 30 tablet 10  . Magnesium Oxide (MAG-OX 400 PO) Take 1 tablet by mouth 2 (two) times daily.    . Multiple Vitamins-Minerals (MULTIVITAMIN PO) Take 1 tablet by mouth daily.    . nitroGLYCERIN (NITROSTAT) 0.4 MG SL tablet Place 1 tablet (0.4 mg total) under the tongue every 5 (five) minutes as needed for chest pain. 25 tablet 3  . rivaroxaban (XARELTO) 20 MG TABS tablet Take 1 tablet (20 mg total) by mouth daily with supper. 30 tablet 4  . TIKOSYN 500 MCG capsule TAKE 1 CAPSULE BY MOUTH 2 TIMES DAILY 60 capsule 11  . zolpidem (AMBIEN) 5 MG tablet TAKE 1 to 2 TABLETS BY MOUTH AT BEDTIME AS NEEDED 180 tablet 0   No current facility-administered medications for this visit.    Allergies:   [place]    Social History:  The patient  reports that he has never smoked. He has never used smokeless tobacco. He reports that he drinks alcohol. He reports that he does not use illicit drugs.    Family History:  The patient's family history includes Breast cancer in his mother; Diabetes in his maternal grandmother; Hyperlipidemia in his maternal grandmother. There is no history of Cancer, COPD, or Heart disease.    ROS:  Please see the history of present illness.   Otherwise, review of systems are positive for chest pressure, dizziness.   All other systems are reviewed and negative.    PHYSICAL EXAM: VS:  BP 100/70 mmHg  Pulse 60  Ht '5\' 10"'  (1.778 m)  Wt 193 lb (87.544 kg)  BMI 27.69 kg/m2 , BMI Body mass index is 27.69 kg/(m^2). GEN: Well nourished, well developed,  in no acute distress HEENT: normal Neck: no JVD, carotid bruits, or masses Cardiac: RRR; no murmurs, rubs, or gallops,no edema  Respiratory:  clear to auscultation bilaterally, normal work of breathing GI: soft, nontender, nondistended, + BS MS: no deformity or atrophy Skin: warm and dry, no rash Neuro:  Strength and sensation are intact Psych: euthymic mood, full affect   EKG:  EKG is ordered today. The ekg ordered today demonstrates paced rhythm with TWI in inferolateral leads unchanged compare to previous EKG   Recent Labs: 08/04/2014: TSH 1.57 02/11/2015: ALT 19; BUN 20; Creat 1.03; Hemoglobin 14.3; Magnesium 1.8; Platelets 182; Potassium 4.1; Sodium 133*    Lipid Panel    Component Value Date/Time   CHOL 163 02/11/2015 0957   TRIG 190* 02/11/2015 0957   HDL 34* 02/11/2015 0957   CHOLHDL 4.8 02/11/2015 0957   VLDL 38* 02/11/2015 0957   LDLCALC 91 02/11/2015 0957   LDLDIRECT 74.0 02/04/2014 1445      Wt Readings from Last 3 Encounters:  05/22/15 193 lb (87.544 kg)  02/09/15 192 lb (87.091 kg)  10/22/14 195 lb (88.451 kg)      Other studies Reviewed: Additional studies/ records that were reviewed today include:   Echo 09/03/2013 LV EF: 20% -  25%  ------------------------------------------------------------------- Indications:   Atrial fibrillation -  427.31.  ------------------------------------------------------------------- History:  PMH: Acquired from the patient and from the patient&'s chart. PMH: Syncope and Collapse. Ventricular Fibrillation. Chronic Systolic Heart Failure. Ischemic Cardiomyopathy. MI. Atrial Fibrillation. Risk factors: Diabetes mellitus.  ------------------------------------------------------------------- Study Conclusions  - Left ventricle: The cavity size was moderately dilated. Wall thickness was normal. Systolic function was severely reduced. The estimated ejection fraction was in the range of 20% to 25%. Diffuse hypokinesis. - Aortic valve: There was trivial regurgitation. - Left atrium: The atrium was moderately dilated. - Right ventricle: The cavity size was moderately dilated. Systolic function was mildly reduced. - Pulmonary arteries: Systolic pressure was mildly to moderately increased. PA peak pressure: 49 mm Hg (S).   Pacemaker change out 02/05/2014 Preoperative diagnosis Aborted cardiac arrest Sinus node dysfunction device at [facility] chf AND ivcd But not sufficient to justify LV lead insertion Postoperative diagnosis same/, violation of insulation of the HV lead   Cath 03/22/2012 Final Conclusions:  1. Mild nonobstructive coronary artery disease with continued patency of the stented segments in the LAD, left circumflex, and right coronary arteries 2. Severe segmental LV systolic dysfunction.  Recommendations: Further discussion of Tikosyn admission for rhythm management of atrial fibrillation. CAD does not seem to be an issue at with continued stent patency.   Review of the above records demonstrates:    ASSESSMENT AND PLAN:  1.  Chest discomfort with typical and atypical features  - Describing as a pressure-like sensation, present with associated dizziness. Also has a second type of sharp stabbing chest pain only lasting seconds at a time. This is second type of chest  pain is clearly not cardiac. I am more concerned about the pressure-like sensation with dizziness.  - He also describes a myriad of other somatic symptoms including a bandlike sensation over his forehead. He states he does check his blood pressure, despite having dizzy spells, his blood pressure was never low. I have discussed with the patient outpatient stress test, it was later found out that that is the reason he was hesitant to do the stress test recommended for him before is because he had a chemical stress test years ago and had a lot of side effects  from it. He says it has been many years since his last stress test, I could not find such record in EPIC system, I have reassured him most people tolerates Lexiscan much better than the traditional adenosine or dobutamine stress test we use years ago.  - he does mention he is having some chest discomfort in the room, I will obtain trop to make sure nothing acute is going on.  2. CAD with remote cardiac arrest  - prior stent to LAD, LCx and RCA  - Cath 03/22/2012 mild nonobstructive CAD, EF 35%, continued patency of stents in LAD, circumflex and RCA  3. ICM with baseline EF 20-25% s/p ICD implants and device change out on 02/05/2014 to Medtronic device with adjuvant use of antimicrobial pouch  4. PAF s/p combined epicardial and endocardial ablation at [facility] on Xarelto and tikosyn: He states the Phyllis Ginger is very expensive even generic version, he will need to discuss with Dr. Caryl Comes in the future regarding any alternatives    Current medicines are reviewed at length with the patient today.  The patient does not have concerns regarding medicines.  The following changes have been made:  no change  Labs/ tests ordered today include:   Orders Placed This Encounter  Procedures  . Troponin I  . Myocardial Perfusion Imaging  . EKG 12-Lead     Disposition:   FU with Dr. Caryl Comes or Joseph Art in 2 months  Signed, Almyra Deforest, [place]  05/22/2015 5:14 PM    [address], [place], [place]  [address] Phone: [phone]; Fax: [phone]

## 2015-05-22 NOTE — Telephone Encounter (Signed)
Pt c/o of Chest Pain: STAT if CP now or developed within 24 hours  1. Are you having CP right now? no  2. Are you experiencing any other symptoms (ex. SOB, nausea, vomiting, sweating)? Little SOB  3. How long have you been experiencing CP? A while  4. Is your CP continuous or coming and going? Comes/goes  5. Have you taken Nitroglycerin? Has taken nitro every day last 3 days ?

## 2015-05-22 NOTE — Telephone Encounter (Signed)
Patient has had intermittent chest pain the last three days and is taking nitro daily.  Scheduled flex appointment today at 3pm with Almyra Deforest.  Patient thankful and appreciative

## 2015-05-22 NOTE — Patient Instructions (Addendum)
Medication Instructions:  Your physician recommends that you continue on your current medications as directed. Please refer to the Current Medication list given to you today.   Labwork: TODAY:  TROPONIN  Testing/Procedures: Your physician has requested that you have a lexiscan myoview. For further information please visit [email or web address]. Please follow instruction sheet, as given.   Follow-Up: Your physician recommends that you keep your scheduled follow-up appointment AS SCHEDULED   Any Other Special Instructions Will Be Listed Below (If Applicable).  Pharmacologic Stress Electrocardiogram A pharmacologic stress electrocardiogram is a heart (cardiac) test that uses nuclear imaging to evaluate the blood supply to your heart. This test may also be called a pharmacologic stress electrocardiography. Pharmacologic means that a medicine is used to increase your heart rate and blood pressure.  This stress test is done to find areas of poor blood flow to the heart by determining the extent of coronary artery disease (CAD). Some people exercise on a treadmill, which naturally increases the blood flow to the heart. For those people unable to exercise on a treadmill, a medicine is used. This medicine stimulates your heart and will cause your heart to beat harder and more quickly, as if you were exercising.  Pharmacologic stress tests can help determine:  The adequacy of blood flow to your heart during increased levels of activity in order to clear you for discharge home.  The extent of coronary artery blockage caused by CAD.  Your prognosis if you have suffered a heart attack.  The effectiveness of cardiac procedures done, such as an angioplasty, which can increase the circulation in your coronary arteries.  Causes of chest pain or pressure. LET [facility] CARE PROVIDER KNOW ABOUT:  Any allergies you have.  All medicines you are taking, including vitamins, herbs, eye drops, creams, and  over-the-counter medicines.  Previous problems you or members of your family have had with the use of anesthetics.  Any blood disorders you have.  Previous surgeries you have had.  Medical conditions you have.  Possibility of pregnancy, if this applies.  If you are currently breastfeeding. RISKS AND COMPLICATIONS Generally, this is a safe procedure. However, as with any procedure, complications can occur. Possible complications include:  You develop pain or pressure in the following areas:  Chest.  Jaw or neck.  Between your shoulder blades.  Radiating down your left arm.  Headache.  Dizziness or light-headedness.  Shortness of breath.  Increased or irregular heartbeat.  Low blood pressure.  Nausea or vomiting.  Flushing.  Redness going up the arm and slight pain during injection of medicine.  Heart attack (rare). BEFORE THE PROCEDURE   Avoid all forms of caffeine for 24 hours before your test or as directed by your health care provider. This includes coffee, tea (even decaffeinated tea), caffeinated sodas, chocolate, cocoa, and certain pain medicines.  Follow your health care provider's instructions regarding eating and drinking before the test.  Take your medicines as directed at regular times with water unless instructed otherwise. Exceptions may include:  If you have diabetes, ask how you are to take your insulin or pills. It is common to adjust insulin dosing the morning of the test.  If you are taking beta-blocker medicines, it is important to talk to your health care provider about these medicines well before the date of your test. Taking beta-blocker medicines may interfere with the test. In some cases, these medicines need to be changed or stopped 24 hours or more before the test.  If you wear  a nitroglycerin patch, it may need to be removed prior to the test. Ask your health care provider if the patch should be removed before the test.  If you use an  inhaler for any breathing condition, bring it with you to the test.  If you are an outpatient, bring a snack so you can eat right after the stress phase of the test.  Do not smoke for 4 hours prior to the test or as directed by your health care provider.  Do not apply lotions, powders, creams, or oils on your chest prior to the test.  Wear comfortable shoes and clothing. Let your health care provider know if you were unable to complete or follow the preparations for your test. PROCEDURE   Multiple patches (electrodes) will be put on your chest. If needed, small areas of your chest may be shaved to get better contact with the electrodes. Once the electrodes are attached to your body, multiple wires will be attached to the electrodes, and your heart rate will be monitored.  An IV access will be started. A nuclear trace (isotope) is given. The isotope may be given intravenously, or it may be swallowed. Nuclear refers to several types of radioactive isotopes, and the nuclear isotope lights up the arteries so that the nuclear images are clear. The isotope is absorbed by your body. This results in low radiation exposure.  A resting nuclear image is taken to show how your heart functions at rest.  A medicine is given through the IV access.  A second scan is done about 1 hour after the medicine injection and determines how your heart functions under stress.  During this stress phase, you will be connected to an electrocardiogram machine. Your blood pressure and oxygen levels will be monitored. AFTER THE PROCEDURE   Your heart rate and blood pressure will be monitored after the test.  You may return to your normal schedule, including diet,activities, and medicines, unless your health care provider tells you otherwise.   This information is not intended to replace advice given to you by your health care provider. Make sure you discuss any questions you have with your health care provider.    Document Released: 07/03/2008 Document Revised: 02/19/2013 Document Reviewed: 10/22/2012 Elsevier Interactive Patient Education [employer].    If you need a refill on your cardiac medications before your next appointment, please call your pharmacy.

## 2015-05-25 ENCOUNTER — Telehealth (HOSPITAL_COMMUNITY): Payer: Self-pay | Admitting: *Deleted

## 2015-05-25 NOTE — Telephone Encounter (Signed)
Left message on voicemail in reference to upcoming appointment scheduled for 05/28/15. Phone number given for a call back so details instructions can be given. Happy Begeman, [place]e [place]

## 2015-05-27 ENCOUNTER — Telehealth (HOSPITAL_COMMUNITY): Payer: Self-pay

## 2015-05-27 NOTE — Telephone Encounter (Signed)
Patient given detailed instructions per Myocardial Perfusion Study Information Sheet for the test on 05/28/15 at 0730. Patient notified to arrive 15 minutes early and that it is imperative to arrive on time for appointment to keep from having the test rescheduled.  If you need to cancel or reschedule your appointment, please call the office within 24 hours of your appointment. Failure to do so may result in a cancellation of your appointment, and a $50 no show fee. Patient verbalized understanding.T. Junice Fei, CNMT, RT-N

## 2015-05-28 ENCOUNTER — Ambulatory Visit (HOSPITAL_COMMUNITY): Payer: Medicare Other | Attending: Cardiovascular Disease

## 2015-05-28 DIAGNOSIS — R0602 Shortness of breath: Secondary | ICD-10-CM | POA: Insufficient documentation

## 2015-05-28 DIAGNOSIS — I517 Cardiomegaly: Secondary | ICD-10-CM | POA: Insufficient documentation

## 2015-05-28 DIAGNOSIS — E119 Type 2 diabetes mellitus without complications: Secondary | ICD-10-CM | POA: Insufficient documentation

## 2015-05-28 DIAGNOSIS — R9439 Abnormal result of other cardiovascular function study: Secondary | ICD-10-CM | POA: Insufficient documentation

## 2015-05-28 DIAGNOSIS — R0789 Other chest pain: Secondary | ICD-10-CM | POA: Diagnosis not present

## 2015-05-28 LAB — MYOCARDIAL PERFUSION IMAGING
CHL CUP NUCLEAR SSS: 18
CSEPPHR: 70 {beats}/min
LHR: 0.4
LV dias vol: 201 mL (ref 62–150)
LVSYSVOL: 139 mL
NUC STRESS TID: 1.08
Rest HR: 61 {beats}/min
SDS: 5
SRS: 15

## 2015-05-28 MED ORDER — REGADENOSON 0.4 MG/5ML IV SOLN
0.4000 mg | Freq: Once | INTRAVENOUS | Status: AC
  Administered 2015-05-28: 0.4 mg via INTRAVENOUS

## 2015-05-28 MED ORDER — TECHNETIUM TC 99M SESTAMIBI GENERIC - CARDIOLITE
10.5000 | Freq: Once | INTRAVENOUS | Status: AC | PRN
  Administered 2015-05-28: 11 via INTRAVENOUS

## 2015-05-28 MED ORDER — TECHNETIUM TC 99M SESTAMIBI GENERIC - CARDIOLITE
32.8000 | Freq: Once | INTRAVENOUS | Status: AC | PRN
  Administered 2015-05-28: 32.8 via INTRAVENOUS

## 2015-05-29 ENCOUNTER — Encounter: Payer: Self-pay | Admitting: *Deleted

## 2015-06-05 ENCOUNTER — Telehealth: Payer: Self-pay | Admitting: *Deleted

## 2015-06-05 ENCOUNTER — Encounter: Payer: Self-pay | Admitting: Physician Assistant

## 2015-06-05 ENCOUNTER — Ambulatory Visit (INDEPENDENT_AMBULATORY_CARE_PROVIDER_SITE_OTHER)
Admission: RE | Admit: 2015-06-05 | Discharge: 2015-06-05 | Disposition: A | Discharge status: Home / Self Care | Payer: Medicare Other | Source: Ambulatory Visit | Attending: Physician Assistant | Admitting: Physician Assistant

## 2015-06-05 ENCOUNTER — Ambulatory Visit (INDEPENDENT_AMBULATORY_CARE_PROVIDER_SITE_OTHER): Payer: Medicare Other | Admitting: Physician Assistant

## 2015-06-05 VITALS — BP 122/78 | HR 61 | Ht 70.0 in | Wt 192.0 lb

## 2015-06-05 DIAGNOSIS — I2510 Atherosclerotic heart disease of native coronary artery without angina pectoris: Secondary | ICD-10-CM

## 2015-06-05 DIAGNOSIS — R51 Headache: Secondary | ICD-10-CM | POA: Diagnosis not present

## 2015-06-05 DIAGNOSIS — I4891 Unspecified atrial fibrillation: Secondary | ICD-10-CM

## 2015-06-05 DIAGNOSIS — I429 Cardiomyopathy, unspecified: Secondary | ICD-10-CM

## 2015-06-05 DIAGNOSIS — R519 Headache, unspecified: Secondary | ICD-10-CM

## 2015-06-05 DIAGNOSIS — R42 Dizziness and giddiness: Secondary | ICD-10-CM | POA: Diagnosis not present

## 2015-06-05 LAB — BASIC METABOLIC PANEL
BUN: 20 mg/dL (ref 7–25)
CO2: 29 mmol/L (ref 20–31)
Calcium: 9.4 mg/dL (ref 8.6–10.3)
Chloride: 98 mmol/L (ref 98–110)
Creat: 1.29 mg/dL — ABNORMAL HIGH (ref 0.70–1.25)
GLUCOSE: 182 mg/dL — AB (ref 65–99)
POTASSIUM: 4.4 mmol/L (ref 3.5–5.3)
SODIUM: 136 mmol/L (ref 135–146)

## 2015-06-05 LAB — MAGNESIUM: MAGNESIUM: 1.8 mg/dL (ref 1.5–2.5)

## 2015-06-05 NOTE — Telephone Encounter (Signed)
-----   Message from [facility], [place] sent at 06/05/2015  4:14 PM EDT ----- Please let the patient know that his labs are stable, no changes to his medicine. Please also tell him he needs to follow up with his primary doctor regarding his headaches and facial symptoms he discussed.  Thanks, [employer]

## 2015-06-05 NOTE — Progress Notes (Signed)
Cardiology Office Note Date:  06/05/2015  Patient ID:  [name] 1945-04-06, MRN [id number] PCP:  Cathlean Cower, MD  Cardiologist/Electrophysiologist: Dr. Caryl Comes   Chief Complaint: "check-up"  History of Present Illness: [name] is a 70 y.o. male with history of a remote aborted cardiac arrest with  ICD in place, PAFib, DM, CAD, comes in today for a f/u visit after testing done being seen for Dr. Caryl Comes.  He was previously seen by myself in Dec 2016,  He mentioned an infrequent/random chest discomfort, central, tight feeling (1/10 in severity) not exertional or positional, on/off for a couple years, maybe more in the last year or two.  He mentioned dizziness after his morning meds particularly, no near syncope or syncope, no shocks from his device.  He was to have had a stress test after that visit but did not with concerners of side effects, he was also urged/recommended statin therapy but again reported history of side effects and did not want to start.  He was subsequently seen by Almyra Deforest, PA a few weeks ago with similar symptoms as well as dizziness, headaches, with no reports of high or low BP when he has done home checks.  He underwent the stress test with no ischemic findings and a Troponin check that was negative He reports today much of the same, though difficult to follow at times.  He comes today with ongoing feeling of a "gurgling" feeling in his chest, on/off for years but more in the last few weeks, headaches that he says feel like a band around his forehead and intermittent feeling like the left side of his face is cold, he has been using s/l NTG for these symptoms concerned that they may be connected to his heart.  He states when he he took the increased dose of carvedilol it made him feel like he was off balance or uncoordinated, so he has been taking 6.39m QID rather then 12.556mtwice daily and with this regime that feeling resolved.  He mentions he will sometimes take his  Tiksoyn a 2-3 hours before/after it's due time, but states he has been taking all of his medicines otherwise as instructed including his xarelto without missing doses.  He is counseled re-educated on the importance of the Tikosyn timing and what to do if he misses a dose and if missed 2 doses to call for instructions, he states understanding.    Past Medical History  Diagnosis Date  . Ischemic cardiomyopathy 09/22/2008    DES CX and RCA  70% residual LAD  Done in [place]; EF 30%;; myoviewq 202008-03-05o ischemia  . Implantable cardiac defibrillator MDT 09/22/2008    Change out feb 2011, 2015  . NEPHROLITHIASIS, HX OF 02/25/2008  . CALLUS, RIGHT FOOT 12/08/2009  . [facility]  . Persistent [place] fibrillation ([facility]/[place]    Inapp shock 202012/03/05F VR >220 recurrent  . HYPERLIPIDEMIA 11/25/2006  . HYPOGONADISM, MALE 12/05/2006  . 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 DIABETES MELLITUS, TYPE II 12/05/2006  . 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 Long term (current) use of anticoagulants 04/25/2010  . [facility]  "I died and they brought me back"  . Kidney stones     Past Surgical History  Procedure Laterality Date  . Cataract extraction    . Tonsillectomy    . Cardiac defibrillator placement      Guidant Vitality [name]  . Ptca    .  Tonsillectomy    . Eye surgery      cataracts  . Cardiac catheterization      feburary 2014  . Cardioversion N/A 04/25/2012    Procedure: CARDIOVERSION;  Surgeon: Darlin Coco, MD;  Location[facility] ENDOSCOPY;  Service: Cardiovascular;  Laterality: N/A;  . Cardioversion N/A 05/21/2012    Procedure: CARDIOVERSION;  Surgeon: Deboraha Sprang, MD;  Location: [place];  Service: Cardiovascular;  Laterality: N/A;  . Implantable cardioverter defibrillator (icd) generator change N/A 02/05/2014    Procedure: ICD GENERATOR CHANGE;  Surgeon: Deboraha Sprang, MD;  Location[facility] C[facility] LAB;  Service:  Cardiovascular;  Laterality: N/A;    Current Outpatient Prescriptions  Medication Sig Dispense Refill  . Blood Glucose Monitoring Suppl (ONE TOUCH ULTRA SYSTEM KIT) W/DEVICE KIT 1 kit by Does not apply route once. 1 each 0  . carvedilol (COREG) 12.5 MG tablet Take 1 tablet (12.5 mg total) by mouth 2 (two) times daily with a meal. 180 tablet 5  . Cyanocobalamin (B-12) 5000 MCG CAPS Take by mouth once.    . dofetilide (TIKOSYN) 500 MCG capsule Take 1 capsule (500 mcg total) by mouth 2 (two) times daily. 60 capsule 4  . fluticasone (FLONASE) 50 MCG/ACT nasal spray Instill 2 sprays into both nostrils daily. 16 g 5  . furosemide (LASIX) 20 MG tablet TAKE 1 TABLET BY MOUTH EVERY DAY 30 tablet 10  . glipiZIDE (GLUCOTROL XL) 10 MG 24 hr tablet TAKE 1 TABLET BY MOUTH EVERY DAY WITH BREAKFAST 90 tablet 0  . glucose blood (ONE TOUCH TEST STRIPS) test strip Use as instructed up to four times per day   250.02 100 each 12  . Lancets MISC Use as directed up to 4 times per day 100 each 11  . lisinopril (PRINIVIL,ZESTRIL) 2.5 MG tablet TAKE 1 TABLET BY MOUTH EVERY DAY 30 tablet 10  . Magnesium Oxide (MAG-OX 400 PO) Take 1 tablet by mouth 2 (two) times daily.    . Multiple Vitamins-Minerals (MULTIVITAMIN PO) Take 1 tablet by mouth daily.    . nitroGLYCERIN (NITROSTAT) 0.4 MG SL tablet Place 1 tablet (0.4 mg total) under the tongue every 5 (five) minutes as needed for chest pain. 25 tablet 3  . rivaroxaban (XARELTO) 20 MG TABS tablet Take 1 tablet (20 mg total) by mouth daily with supper. 30 tablet 4  . TIKOSYN 500 MCG capsule TAKE 1 CAPSULE BY MOUTH 2 TIMES DAILY 60 capsule 11  . zolpidem (AMBIEN) 5 MG tablet TAKE 1 to 2 TABLETS BY MOUTH AT BEDTIME AS NEEDED 180 tablet 0   No current facility-administered medications for this visit.    Allergies:   Salmon   Social History:  The patient  reports that he has never smoked. He has never used smokeless tobacco. He reports that he drinks alcohol. He reports that  he does not use illicit drugs.   Family History:  The patient's family history includes Breast cancer in his mother; Diabetes in his maternal grandmother; Hyperlipidemia in his maternal grandmother. There is no history of Cancer, COPD, or Heart disease.  ROS:  Please see the history of present illness.    All other systems are reviewed and otherwise negative.   PHYSICAL EXAM:  VS:  BP 122/78 mmHg  Pulse 61  Ht _0  (1.778 m)  Wt 192 lb (87.091 kg)  BMI 27.55 kg/m2 BMI: Body mass index is 27.55 kg/(m^2). Well nourished, well developed, in no acute distress HEENT: normocephalic, atraumatic Neck: no JVD, carotid  bruits or masses Cardiac:  normal S1, S2; RRR; 1/6 SM, no rubs, or gallops Lungs:  clear to auscultation bilaterally, no wheezing, rhonchi or rales Abd: soft, nontender MS: no deformity or atrophy Ext: no edema, 2-3+ pedal pulses b/l, 3+ radial pulses b/l Skin: warm and dry, no rash Neuro:  No gross deficits appreciated Psych: euthymic mood, full affect  ICD site is stable, no tethering or discomfort   EKG:  Done today and reviewed by myself shows atrial paced, V sensed, T changes appear similar to his previous EKG, given IVCD, QTc appears stable ICD check today by industry shows normal device function, no therapies, his AF burden is up to 13.3% from 7.2% at his last check and longest episode 13 hours, optivol is sable  05/28/15 stress myoview Study Highlights     Nuclear stress EF: 30%.  No T wave inversion was noted during stress.  There was no ST segment deviation noted during stress.  Defect 1: There is a large defect of severe severity.  Findings consistent with prior myocardial infarction.  This is a high risk study.  Large size, severe intensity inferoseptal, inferior, apical and inferolateral wall fixed perfusion defect suggestive of scar. No significant reversible ischemia. LVEF 30% with severe inferior hypokinesis to akinesis. This is a high risk study  due to severely reduced LVEF.     09/03/13: Echocardiogram Study Conclusions - Left ventricle: The cavity size was moderately dilated. Wall thickness was normal. Systolic function was severely reduced. The estimated ejection fraction was in the range of 20% to 25%. Diffuse hypokinesis. - Aortic valve: There was trivial regurgitation. - Left atrium: The atrium was moderately dilated. - Right ventricle: The cavity size was moderately dilated. Systolic function was mildly reduced. - Pulmonary arteries: Systolic pressure was mildly to moderately increased. PA peak pressure: 49 mm Hg (S).  Recent Labs: 05/09/2014: Magnesium 1.8 08/04/2014: ALT 18; BUN 22; Creatinine, Ser 1.09; Potassium 4.1; Sodium 133*; TSH 1.57 10/22/2014: Hemoglobin 14.9; Platelets 139.0*  02/11/2015: Cholesterol 163; HDL 34*; LDL Cholesterol 91; Total CHOL/HDL Ratio 4.8; Triglycerides 190*; VLDL 38*  02/11/15: K+ 4.1, Mag 1.8, Creat 1.03 (Calc Cr.Cl 85) 05/22/15: Trop <0.03  CrCl cannot be calculated (Patient has no serum creatinine result on file.).   Wt Readings from Last 3 Encounters:  06/05/15 192 lb (87.091 kg)  05/28/15 193 lb (87.544 kg)  05/22/15 193 lb (87.544 kg)     Other studies reviewed: Additional studies/records reviewed today include: summarized above  DEVICE: generator change Feb 05, 2014 MDT device (with antimicrobial pouch) Dr. Caryl Comes, generator change 2011, original device 2006  ASSESSMENT AND PLAN:   Aborted cardiac arrest, ICM, ICD              appears euvolemic on BB/ACE,diuretic             No VT/VF or therapies on his device check              q 3 month [facility]   PAFib              convergent ablation at [facility]              He has been having more AF, particularly April             on Tikosyn 554mg Creat Cl by last labs in June is 891(using July weight)             Xarelto CHADS2Vasc is at least 4, H/H stable at last  check He is having more AFib then previously, possibly the  etiology of his chest complaint, he is on max dose of Tikosyn and does not seem to tolerate very well up-titration of his BB. For now no changes, will discuss with Dr. Caryl Comes.    CAD              prior stenting of his LAD circumflex and RCA remotely, he thinks about 2006, none since then             Catheterization 1/14 demonstrated no obstructive and and patent grafts.             Had c/o some mild chest tightness infrequently, no ischemia on his stress test             On BB, not a statin.   We have discussed starting a stain and why but he is not inclined, states he was on a statin historically, stopped for a reason he cannot remember and is not willing to retry  4. Headaches, facial symptom     Discussed with Dr. Burt Knack (DOD) the patient's headaches, facial symptoms and concern given his xarelto, he disussed with the patient and recommended head CT to be done, the patient is agreeable. Instructed not to take s/l NTG for his headaches.   PLAN:  CT head, BMET and mag level today.  Disposition: f/u with Dr. Caryl Comes, 3 months, pending results of tests, sooner if needed.  Current medicines are reviewed at length with the patient today.  The patient did not have any concerns regarding medicines.  [name] Lasso, PA-C 06/05/2015 3:53 PM     [address] [place] [address] [phone] (office)  [phone] (fax)

## 2015-06-05 NOTE — Patient Instructions (Addendum)
Medication Instructions:   Your physician recommends that you continue on your current medications as directed. Please refer to the Current Medication list given to you today.  If you need a refill on your cardiac medications before your next appointment, please call your pharmacy.  Labwork:  Air cabin crew today    Testing/Procedures:  CT BRAIN SCAN    Follow-Up: with dr Caryl Comes in 3 months   Any Other Special Instructions Will Be Listed Below (If Applicable).   you have been reccomended to follow up with your primary care dr..

## 2015-06-18 ENCOUNTER — Encounter: Payer: Self-pay | Admitting: Family Medicine

## 2015-06-18 ENCOUNTER — Ambulatory Visit (INDEPENDENT_AMBULATORY_CARE_PROVIDER_SITE_OTHER): Payer: Medicare Other | Admitting: Family Medicine

## 2015-06-18 VITALS — BP 120/80 | HR 74 | Temp 98.0°F | Wt 190.7 lb

## 2015-06-18 DIAGNOSIS — J209 Acute bronchitis, unspecified: Secondary | ICD-10-CM | POA: Diagnosis not present

## 2015-06-18 DIAGNOSIS — R05 Cough: Secondary | ICD-10-CM | POA: Diagnosis not present

## 2015-06-18 DIAGNOSIS — R059 Cough, unspecified: Secondary | ICD-10-CM

## 2015-06-18 MED ORDER — BENZONATATE 100 MG PO CAPS: 100.0000 mg | ORAL_CAPSULE | Freq: Three times a day (TID) | ORAL | Status: DC

## 2015-06-18 NOTE — Progress Notes (Signed)
Subjective:    Patient ID: [name], male    DOB: Jun 21, 1945, 70 y.o.   MRN: [id number]  HPI  [name] is a 70 year old male who presents today with a productive cough with green/yellow sputum. Associated symptoms of congestion, with mild sinus and ear pressure, and post nasal drip noted for 3 days. Denies fever, chills, and sweats. Denies recent sick contact or recent antibiotic use. No history of asthma but he has stated episodes of bronchitis after seasonal allergy exacerbation is noted. Past medical history includes ischemic myopathy/CAD/Systolic CHF, DM-2 with complications, and Afib s/p ablation    Treatment at home for symptoms includes flonase with limited benefit.  Review of Systems  Constitutional: Negative for fever, chills and fatigue.  HENT: Positive for congestion, ear pain and postnasal drip.   Eyes: Negative for visual disturbance.  Respiratory: Positive for cough. Negative for shortness of breath and wheezing.   Cardiovascular: Negative for chest pain, palpitations and leg swelling.  Gastrointestinal: Negative for nausea, vomiting, abdominal pain, diarrhea and constipation.  Genitourinary: Negative for dysuria and frequency.  Musculoskeletal: Negative for myalgias and arthralgias.  Skin: Negative for rash.  Neurological: Negative for dizziness, numbness and headaches.  Psychiatric/Behavioral:       Denies depressed or anxious mood   Past Medical History  Diagnosis Date  . Ischemic cardiomyopathy 09/22/2008    DES CX and RCA  70% residual LAD  Done in [place]; EF 30%;; myoviewq 05/16/06 no ischemia  . Implantable cardiac defibrillator MDT 09/22/2008    Change out feb 2011, 2015  . NEPHROLITHIASIS, HX OF 02/25/2008  . CALLUS, RIGHT FOOT 12/08/2009  . Chronic systolic heart failure ([place]) 11/03/2009  . Persistent atrial fibrillation ([place]) 11/25/2006    Inapp shock 05-16-2010 AF VR >220 recurrent  . HYPERLIPIDEMIA 11/25/2006  . HYPOGONADISM, MALE 12/05/2006  . DIABETIC PERIPHERAL  NEUROPATHY 12/08/2009  . DIABETES MELLITUS, TYPE II 12/05/2006  . 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 Long term (current) use of anticoagulants 04/25/2010  . Myocardial infarction [facility]) 05/16/04    "I died and they brought me back"  . Kidney stones      Social History   Social History  . Marital Status: Divorced    Spouse Name: N/A  . Number of Children: 1  . Years of Education: 16   Occupational History  . 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 Smoking status: Never Smoker   . Smokeless tobacco: Never Used  . Alcohol Use: 0.0 oz/week    0 Standard drinks or equivalent per week     Comment: summer   . Drug Use: No  . Sexual Activity:    Partners: Female   Other Topics Concern  . Not on file   Social History Narrative   HSG, College grad. Married - divorced. 1 daughter. owner/operator Research officer, [employer] business and show production    Past Surgical History  Procedure Laterality Date  . Cataract extraction    . Tonsillectomy    . Cardiac defibrillator placement      Guidant Vitality [name]  . Ptca    . Tonsillectomy    . Eye surgery      cataracts  . Cardiac catheterization      feburary 05-16-2012  . Cardioversion N/A 04/25/2012    Procedure: CARDIOVERSION;  Surgeon: Darlin Coco, MD;  Location: [place];  Service: Cardiovascular;  Laterality: N/A;  . Cardioversion N/A 05/21/2012  Procedure: CARDIOVERSION;  Surgeon: Deboraha Sprang, MD;  Location: [place];  Service: Cardiovascular;  Laterality: N/A;  . Implantable cardioverter defibrillator (icd) generator change N/A 02/05/2014    Procedure: ICD GENERATOR CHANGE;  Surgeon: Deboraha Sprang, MD;  Location: [facility] C[facility] LAB;  Service: Cardiovascular;  Laterality: N/A;    Family History  Problem Relation Age of Onset  . Diabetes Maternal Grandmother   . Hyperlipidemia Maternal Grandmother   . Cancer Neg Hx   . COPD Neg Hx   . Heart disease Neg Hx   . Breast cancer  Mother     Allergies  Allergen Reactions  . Salmon [Fish Allergy] Other (See Comments)    Only canned/ not fresh (probably preservatives).     Current Outpatient Prescriptions on File Prior to Visit  Medication Sig Dispense Refill  . Blood Glucose Monitoring Suppl (ONE TOUCH ULTRA SYSTEM KIT) W/DEVICE KIT 1 kit by Does not apply route once. 1 each 0  . carvedilol (COREG) 12.5 MG tablet Take 1 tablet (12.5 mg total) by mouth 2 (two) times daily with a meal. 180 tablet 5  . Cyanocobalamin (B-12) 5000 MCG CAPS Take by mouth once.    . dofetilide (TIKOSYN) 500 MCG capsule Take 1 capsule (500 mcg total) by mouth 2 (two) times daily. 60 capsule 4  . fluticasone (FLONASE) 50 MCG/ACT nasal spray Instill 2 sprays into both nostrils daily. 16 g 5  . furosemide (LASIX) 20 MG tablet TAKE 1 TABLET BY MOUTH EVERY DAY 30 tablet 10  . glipiZIDE (GLUCOTROL XL) 10 MG 24 hr tablet TAKE 1 TABLET BY MOUTH EVERY DAY WITH BREAKFAST 90 tablet 0  . glucose blood (ONE TOUCH TEST STRIPS) test strip Use as instructed up to four times per day   250.02 100 each 12  . Lancets MISC Use as directed up to 4 times per day 100 each 11  . lisinopril (PRINIVIL,ZESTRIL) 2.5 MG tablet TAKE 1 TABLET BY MOUTH EVERY DAY 30 tablet 10  . Magnesium Oxide (MAG-OX 400 PO) Take 1 tablet by mouth 2 (two) times daily.    . Multiple Vitamins-Minerals (MULTIVITAMIN PO) Take 1 tablet by mouth daily.    . nitroGLYCERIN (NITROSTAT) 0.4 MG SL tablet Place 1 tablet (0.4 mg total) under the tongue every 5 (five) minutes as needed for chest pain. 25 tablet 3  . rivaroxaban (XARELTO) 20 MG TABS tablet Take 1 tablet (20 mg total) by mouth daily with supper. 30 tablet 4  . TIKOSYN 500 MCG capsule TAKE 1 CAPSULE BY MOUTH 2 TIMES DAILY 60 capsule 11  . zolpidem (AMBIEN) 5 MG tablet TAKE 1 to 2 TABLETS BY MOUTH AT BEDTIME AS NEEDED 180 tablet 0   No current facility-administered medications on file prior to visit.    BP 120/80 mmHg  Pulse 74   Temp(Src) 98 F (36.7 C) (Oral)  Wt 190 lb 11.2 oz (86.501 kg)  SpO2 97%       Objective:   Physical Exam  Constitutional: He is oriented to person, place, and time. He appears well-developed and well-nourished.  HENT:  Right Ear: Tympanic membrane normal.  Left Ear: Tympanic membrane normal.  Nose: No rhinorrhea. Right sinus exhibits no maxillary sinus tenderness and no frontal sinus tenderness. Left sinus exhibits no maxillary sinus tenderness and no frontal sinus tenderness.  Mouth/Throat: Mucous membranes are normal. No oropharyngeal exudate or posterior oropharyngeal erythema.  Eyes: Pupils are equal, round, and reactive to light.  Cardiovascular: Normal rate and regular rhythm.  No murmur heard. Pulmonary/Chest: Effort normal and breath sounds normal. He has no wheezes. He has no rales.  Nonproductive cough present during exam  Abdominal: Soft. Bowel sounds are normal. There is no tenderness.  Lymphadenopathy:    He has cervical adenopathy.  Neurological: He is alert and oriented to person, place, and time.  Skin: Skin is warm and dry. No rash noted.  Psychiatric: He has a normal mood and affect. His behavior is normal.       Assessment & Plan:  1. Acute bronchitis, unspecified organism History and exam supportive of acute bronchitis. Advised patient on supportive measures:  Get rest, drink plenty of fluids, and return to clinic if symptoms persist, worsen, or fever >101 develops. Patient verbalizes understanding.  Discussed with patient the use of Allegra, Claritin, or Zyrtec with flonase for symptoms of allergic rhinitis if needed.  2. Cough  - benzonatate (TESSALON) 100 MG capsule; Take 1 capsule (100 mg total) by mouth 3 (three) times daily.  Dispense: 20 capsule; Refill: 0  Delano Metz, FNP-C

## 2015-06-18 NOTE — Progress Notes (Signed)
Pre visit review using our clinic review tool, if applicable. No additional management support is needed unless otherwise documented below in the visit note. 

## 2015-06-18 NOTE — Patient Instructions (Addendum)
Please use Allegra, Claritin, or Zyrtec for symptoms of allergic rhinitis. Continue use of flonase and use benzonatate for cough as needed Return to clinic if your symptoms persist, worsen, or you develop a fever >101.  Acute Bronchitis Bronchitis is inflammation of the airways that extend from the windpipe into the lungs (bronchi). The inflammation often causes mucus to develop. This leads to a cough, which is the most common symptom of bronchitis.  In acute bronchitis, the condition usually develops suddenly and goes away over time, usually in a couple weeks. Smoking, allergies, and asthma can make bronchitis worse. Repeated episodes of bronchitis may cause further lung problems.  CAUSES Acute bronchitis is most often caused by the same virus that causes a cold. The virus can spread from person to person (contagious) through coughing, sneezing, and touching contaminated objects. SIGNS AND SYMPTOMS   Cough.   Fever.   Coughing up mucus.   Body aches.   Chest congestion.   Chills.   Shortness of breath.   Sore throat.  DIAGNOSIS  Acute bronchitis is usually diagnosed through a physical exam. Your health care provider will also ask you questions about your medical history. Tests, such as chest X-rays, are sometimes done to rule out other conditions.  TREATMENT  Acute bronchitis usually goes away in a couple weeks. Oftentimes, no medical treatment is necessary. Medicines are sometimes given for relief of fever or cough. Antibiotic medicines are usually not needed but may be prescribed in certain situations. In some cases, an inhaler may be recommended to help reduce shortness of breath and control the cough. A cool mist vaporizer may also be used to help thin bronchial secretions and make it easier to clear the chest.  HOME CARE INSTRUCTIONS  Get plenty of rest.   Drink enough fluids to keep your urine clear or pale yellow (unless you have a medical condition that requires  fluid restriction). Increasing fluids may help thin your respiratory secretions (sputum) and reduce chest congestion, and it will prevent dehydration.   Take medicines only as directed by your health care provider.  If you were prescribed an antibiotic medicine, finish it all even if you start to feel better.  Avoid smoking and secondhand smoke. Exposure to cigarette smoke or irritating chemicals will make bronchitis worse. If you are a smoker, consider using nicotine gum or skin patches to help control withdrawal symptoms. Quitting smoking will help your lungs heal faster.   Reduce the chances of another bout of acute bronchitis by washing your hands frequently, avoiding people with cold symptoms, and trying not to touch your hands to your mouth, nose, or eyes.   Keep all follow-up visits as directed by your health care provider.  SEEK MEDICAL CARE IF: Your symptoms do not improve after 1 week of treatment.  SEEK IMMEDIATE MEDICAL CARE IF:  You develop an increased fever or chills.   You have chest pain.   You have severe shortness of breath.  You have bloody sputum.   You develop dehydration.  You faint or repeatedly feel like you are going to pass out.  You develop repeated vomiting.  You develop a severe headache. MAKE SURE YOU:   Understand these instructions.  Will watch your condition.  Will get help right away if you are not doing well or get worse.   This information is not intended to replace advice given to you by your health care provider. Make sure you discuss any questions you have with your health care provider.  Document Released: 03/24/2004 Document Revised: 03/07/2014 Document Reviewed: 08/07/2012 Elsevier Interactive Patient Education [employer].

## 2015-06-29 ENCOUNTER — Other Ambulatory Visit: Payer: Self-pay | Admitting: Internal Medicine

## 2015-06-30 NOTE — Telephone Encounter (Signed)
Not able for ambien  Pt needs ROV please

## 2015-07-01 DIAGNOSIS — H524 Presbyopia: Secondary | ICD-10-CM | POA: Diagnosis not present

## 2015-07-02 ENCOUNTER — Other Ambulatory Visit: Payer: Self-pay | Admitting: Internal Medicine

## 2015-07-03 NOTE — Telephone Encounter (Signed)
Medication faxed to pharmacy 

## 2015-07-03 NOTE — Telephone Encounter (Signed)
Please advise 

## 2015-07-03 NOTE — Telephone Encounter (Signed)
Done hardcopy to Corinne  

## 2015-07-07 NOTE — Telephone Encounter (Signed)
See refill 07/02/15 med already approved.Marland KitchenJohny [name]

## 2015-07-09 ENCOUNTER — Telehealth: Payer: Self-pay | Admitting: *Deleted

## 2015-07-09 ENCOUNTER — Other Ambulatory Visit: Payer: Self-pay | Admitting: *Deleted

## 2015-07-09 DIAGNOSIS — R531 Weakness: Secondary | ICD-10-CM

## 2015-07-09 DIAGNOSIS — R5383 Other fatigue: Secondary | ICD-10-CM

## 2015-07-09 DIAGNOSIS — R51 Headache: Secondary | ICD-10-CM

## 2015-07-09 DIAGNOSIS — R519 Headache, unspecified: Secondary | ICD-10-CM

## 2015-07-09 NOTE — Telephone Encounter (Signed)
-----   Message from [place], [place] sent at 07/08/2015  1:20 PM EDT ----- TSH and CBC (in the email below), and a f/u in the next week or so either with me, amber or Dr. Caryl Comes,  thanks!! Renee ----- Message -----    From: Claude Manges, CMA    Sent: 07/07/2015   8:24 AM      To: Baldwin [place], PA-C  Hi Renee,  Could you tell me again what labs you was needing for him. I cant find our last message about this pt in messages. I looked in his chart under labs or telephone encounter and didn't find there either.  ----- Message -----    From: Baldwin [place], PA-C    Sent: 07/06/2015   2:43 PM      To: Claude Manges, CMA  Edwena Blow, Did you ever get in-touch with this patient?  I have not seen labs yet or an appointment.  Let me know, thanks. Renee ----- Message -----    From: Baldwin [place], PA-C    Sent: 06/11/2015   2:04 PM      To: Claude Manges, CMA  I tried to call the patient, unable to leave a message.  Can you try to reach him please, let him know I discussed his visit and symptoms he mentioned to me with Dr. Caryl Comes, he would like further labs and sooner follow up.  If you can have him come in for TSH and CBC in the next couple days is best, and also please and move up his follow up office visit (with me is OK but on a day with Dr. Caryl Comes in the office as well would be best) in the next 3-4 weeks.  Thanks

## 2015-07-13 ENCOUNTER — Other Ambulatory Visit (INDEPENDENT_AMBULATORY_CARE_PROVIDER_SITE_OTHER): Payer: Medicare Other | Admitting: *Deleted

## 2015-07-13 DIAGNOSIS — R5383 Other fatigue: Secondary | ICD-10-CM

## 2015-07-13 DIAGNOSIS — R531 Weakness: Secondary | ICD-10-CM

## 2015-07-13 DIAGNOSIS — R519 Headache, unspecified: Secondary | ICD-10-CM

## 2015-07-13 DIAGNOSIS — R51 Headache: Secondary | ICD-10-CM | POA: Diagnosis not present

## 2015-07-13 LAB — CBC WITH DIFFERENTIAL/PLATELET
BASOS ABS: 58 {cells}/uL (ref 0–200)
Basophils Relative: 1 %
Eosinophils Absolute: 290 cells/uL (ref 15–500)
Eosinophils Relative: 5 %
HEMATOCRIT: 39.8 % (ref 38.5–50.0)
Hemoglobin: 14 g/dL (ref 13.2–17.1)
LYMPHS PCT: 24 %
Lymphs Abs: 1392 cells/uL (ref 850–3900)
MCH: 29.9 pg (ref 27.0–33.0)
MCHC: 35.2 g/dL (ref 32.0–36.0)
MCV: 84.9 fL (ref 80.0–100.0)
MONO ABS: 870 {cells}/uL (ref 200–950)
MPV: 12.6 fL — ABNORMAL HIGH (ref 7.5–12.5)
Monocytes Relative: 15 %
NEUTROS PCT: 55 %
Neutro Abs: 3190 cells/uL (ref 1500–7800)
Platelets: 165 10*3/uL (ref 140–400)
RBC: 4.69 MIL/uL (ref 4.20–5.80)
RDW: 13.5 % (ref 11.0–15.0)
WBC: 5.8 10*3/uL (ref 3.8–10.8)

## 2015-07-13 LAB — TSH: TSH: 2.7 mIU/L (ref 0.40–4.50)

## 2015-07-13 NOTE — Addendum Note (Signed)
Addended by: Eulis Foster on: 07/13/2015 02:53 PM   Modules accepted: Orders

## 2015-07-16 ENCOUNTER — Telehealth: Payer: Self-pay | Admitting: *Deleted

## 2015-07-16 NOTE — Telephone Encounter (Signed)
SPOKE TO PT

## 2015-07-28 ENCOUNTER — Other Ambulatory Visit: Payer: Self-pay | Admitting: Internal Medicine

## 2015-08-24 ENCOUNTER — Other Ambulatory Visit: Payer: Self-pay | Admitting: Internal Medicine

## 2015-09-08 ENCOUNTER — Encounter: Payer: Self-pay | Admitting: Internal Medicine

## 2015-09-08 ENCOUNTER — Ambulatory Visit (INDEPENDENT_AMBULATORY_CARE_PROVIDER_SITE_OTHER): Payer: Medicare Other | Admitting: Internal Medicine

## 2015-09-08 VITALS — BP 104/68 | HR 62 | Ht 70.0 in | Wt 192.6 lb

## 2015-09-08 DIAGNOSIS — I429 Cardiomyopathy, unspecified: Secondary | ICD-10-CM | POA: Diagnosis not present

## 2015-09-08 DIAGNOSIS — I480 Paroxysmal atrial fibrillation: Secondary | ICD-10-CM | POA: Diagnosis not present

## 2015-09-08 MED ORDER — RANOLAZINE ER 500 MG PO TB12: 500.0000 mg | ORAL_TABLET | Freq: Two times a day (BID) | ORAL | Status: DC

## 2015-09-08 NOTE — Progress Notes (Signed)
    Patient Care Team: Bayler W John, MD as PCP - General (Internal Medicine)   HPI  [name] is a 70 y.o. male Seen in followup for aborted cardiac arrest, atrial fibrillation and most recently an episode of syncope this occurs in the context of  ischemic cardiomyopathy with prior stenting of his LAD circumflex and RCA. Catheterization 1/14 demonstrated no obstructive and and patent grafts. He is status post ICD implantation. He underwent device generator replacement 12/15 with the adjunctive use of antimicrobial pouch  He has undergone convergent ablation at [facility]. He is  managed post ablation dofetilide.  He has ischemic heart disease with prior stenting  Echocardiogram 8/14  Demonstrated ejecti on fraction 20% and inferoposterior akinesis. There is moderate left atrial enlargement. There was no interval improvement in ejection fraction following convergent ablation. It was 20-25% 7/15  At his last visit we were able to finally get him on low-dose beta blockers as well as ACE inhibitor.  He is not having chest pain. He is not having edema. He does have intermittent green sputum production. He has seen some of the care providers associated with Dr. John. Some days are terrible some days are good. He has noted this increasingly and seems to correlate with the increasing burden of atrial fibrillation   Since having undergone generator replacement he has been somewhat more active. He has been using his chainsaw. He has been involved at work sometimes up to 8-10 hours a day. He paces himself well.   4/17 Cr 1.29 K  4.4      Past Medical History  Diagnosis Date  . Ischemic cardiomyopathy 09/22/2008    DES CX and RCA  70% residual LAD  Done in [place]; EF 30%;; myoviewq 2008 no ischemia  . Implantable cardiac defibrillator MDT 09/22/2008    Change out feb 2011, 2015  . NEPHROLITHIASIS, HX OF 02/25/2008  . CALLUS, RIGHT FOOT 12/08/2009  . Chronic systolic heart failure (HCC) 11/03/2009    . Persistent atrial fibrillation (HCC) 11/25/2006    Inapp shock 2012 AF VR >220 recurrent  . HYPERLIPIDEMIA 11/25/2006  . HYPOGONADISM, MALE 12/05/2006  . 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 DIABETES MELLITUS, TYPE II 12/05/2006  . 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 Long term (current) use of anticoagulants 04/25/2010  . Myocardial infarction (HCC) 2006    "I died and they brought me back"  . Kidney stones     Past Surgical History  Procedure Laterality Date  . Cataract extraction    . Tonsillectomy    . Cardiac defibrillator placement      Guidant Vitality [name]  . Ptca    . Tonsillectomy    . Eye surgery      cataracts  . Cardiac catheterization      feburary 2014  . Cardioversion N/A 04/25/2012    Procedure: CARDIOVERSION;  Surgeon: Thomas Brackbill, MD;  Location: [facility] ENDOSCOPY;  Service: Cardiovascular;  Laterality: N/A;  . Cardioversion N/A 05/21/2012    Procedure: CARDIOVERSION;  Surgeon: Steven C Klein, MD;  Location: [facility] ENDOSCOPY;  Service: Cardiovascular;  Laterality: N/A;  . Implantable cardioverter defibrillator (icd) generator change N/A 02/05/2014    Procedure: ICD GENERATOR CHANGE;  Surgeon: Steven C Klein, MD;  Location: [facility] CATH LAB;  Service: Cardiovascular;  Laterality: N/A;    Current Outpatient Prescriptions  Medication Sig Dispense Refill  . Blood Glucose Monitoring Suppl (ONE TOUCH ULTRA SYSTEM KIT) W/DEVICE KIT 1 kit by Does not apply   route once. 1 each 0  . carvedilol (COREG) 12.5 MG tablet Take 1 tablet (12.5 mg total) by mouth 2 (two) times daily with a meal. 180 tablet 5  . Cyanocobalamin (B-12) 5000 MCG CAPS Take 1 tablet by mouth daily.     Marland Kitchen dofetilide (TIKOSYN) 500 MCG capsule Take 1 capsule (500 mcg total) by mouth 2 (two) times daily. 60 capsule 4  . fluticasone (FLONASE) 50 MCG/ACT nasal spray Instill 2 sprays into both nostrils daily. 16 g 5  . furosemide (LASIX) 20 MG tablet TAKE 1 TABLET BY MOUTH EVERY DAY 30 tablet 10  .  glipiZIDE (GLUCOTROL XL) 10 MG 24 hr tablet TAKE 1 TABLET BY MOUTH EVERY DAY WITH BREAKFAST 30 tablet 3  . glucose blood (ONE TOUCH TEST STRIPS) test strip Use as instructed up to four times per day   250.02 100 each 12  . Lancets MISC Use as directed up to 4 times per day 100 each 11  . lisinopril (PRINIVIL,ZESTRIL) 2.5 MG tablet TAKE 1 TABLET BY MOUTH EVERY DAY 30 tablet 10  . loratadine (CLARITIN) 10 MG tablet Take 10 mg by mouth daily.    . Magnesium Oxide (MAG-OX 400 PO) Take 1 tablet by mouth 2 (two) times daily.    . Multiple Vitamins-Minerals (MULTIVITAMIN PO) Take 1 tablet by mouth daily.    . nitroGLYCERIN (NITROSTAT) 0.4 MG SL tablet Place 1 tablet (0.4 mg total) under the tongue every 5 (five) minutes as needed for chest pain. 25 tablet 3  . TIKOSYN 500 MCG capsule TAKE 1 CAPSULE BY MOUTH 2 TIMES DAILY 60 capsule 11  . XARELTO 20 MG TABS tablet TAKE 1 TABLET BY MOUTH EVERY DAY 30 tablet 5  . zolpidem (AMBIEN) 5 MG tablet Take 5 mg by mouth at bedtime as needed for sleep.     No current facility-administered medications for this visit.    Allergies  Allergen Reactions  . Salmon [Fish Allergy] Other (See Comments)    Only canned/ not fresh (probably preservatives).     Review of Systems negative except from HPI and PMH  Physical Exam BP 104/68 mmHg  Pulse 62  Ht 5' 10" (1.778 m)  Wt 192 lb 9.6 oz (87.363 kg)  BMI 27.64 kg/m2  SpO2 96% Well developed and nourished in no acute distress HENT normal Neck supple with JVP-flat Clear Device pocket well healed; without hematoma or erythema.  There is no tethering  Regular rate and rhythm, no murmurs or gallops Abd-soft with active BS No Clubbing cyanosis edema Skin-warm and dry A & Oriented  Grossly normal sensory and motor function_0  ECG demonstrates atrial pacing at 81 Intervals 22/14/46  Assessment and  Plan  Atrial fibrillation s/p convergent ablation on dofetilide  Ischemic cardiomyopathy with three-vessel  stenting and EF 20%  Congestive heart failure-chronic-systolic  Diabetes]  Implantable defibrillator-[employer]onic The patient's device was interrogated.  The information was reviewed. No changes were made in the programming.    Hypotension  asymptomatic    He is having increasing atrial fibrillation burden. His SMA be in touch with Dr. Lehman Prom concerning procedural options. I have asked him. In the interim we will give him ranolazine and see if that doesn't augment his dofetilide benefit.  He is having recurrent problems with green sputum production; last Dr. Jenny Reichmann to consider pulmonary evaluation  Without symptoms of ischemia  He has requested a handicap sticker. Given his functional limitations with his cardiac disease we will request one.   More than 50%  of 45 min was spent in counseling related to the above

## 2015-09-08 NOTE — Patient Instructions (Signed)
Medication Instructions:  Your physician has recommended you make the following change in your medication: 1) Start Ranexa 500 mg twice daily    Labwork: None ordered   Testing/Procedures: None ordered   Follow-Up: Your physician wants you to follow-up in: 6 months with Dr Gari Crown will receive a reminder letter in the mail two months in advance. If you don't receive a letter, please call our office to schedule the follow-up appointment.  Remote monitoring is used to monitor your  ICD from home. This monitoring reduces the number of office visits required to check your device to one time per year. It allows [place] to keep an eye on the functioning of your device to ensure it is working properly. You are scheduled for a device check from home on 12/08/15. You may send your transmission at any time that day. If you have a wireless device, the transmission will be sent automatically. After your physician reviews your transmission, you will receive a postcard with your next transmission date.     Any Other Special Instructions Will Be Listed Below (If Applicable).  Dr Caryl Comes has contacted Dr Lehman Prom at [facility] be in touch      If you need a refill on your cardiac medications before your next appointment, please call your pharmacy.

## 2015-09-16 ENCOUNTER — Telehealth: Payer: Self-pay | Admitting: Internal Medicine

## 2015-09-16 DIAGNOSIS — I4891 Unspecified atrial fibrillation: Secondary | ICD-10-CM

## 2015-09-16 NOTE — Telephone Encounter (Signed)
I called and spoke with the patient to relay to him that Dr. Caryl Comes has been in touch with Dr. Lehman Prom. Per Dr. Lehman Prom, he will gladly see the patient at [name] or he would recommend Dr. Willis Modena at [facility]. The patient states he would like to follow up with Dr. Lehman Prom at [facility]- I advised him I will work on this and be back in touch with him.  The patient also wanted to let Dr. Caryl Comes know that he feels like he is doing some better with his a-fib with the Ranexa.

## 2015-09-21 NOTE — Telephone Encounter (Signed)
Heahter,  Dr Lehman Prom has not left [facility]. I called spoke with the referral department and was advised that pt is not a new pt and he should just call and schedule that appt @ [phone] or [phone] .    Thanks  Sonya     Reviewed with Davy Pique- she left a detailed message for the patient to call and schedule with Dr. Lehman Prom.

## 2015-10-08 ENCOUNTER — Other Ambulatory Visit: Payer: Self-pay | Admitting: Internal Medicine

## 2015-10-08 NOTE — Telephone Encounter (Signed)
Done hardcopy to Corinne  One mo supply only as pt not seen since aug 2016  Needs OV for further refills

## 2015-10-08 NOTE — Telephone Encounter (Signed)
Medication refill sent to pharmacy  

## 2015-10-15 ENCOUNTER — Emergency Department (HOSPITAL_COMMUNITY)
Admission: EM | Admit: 2015-10-15 | Discharge: 2015-10-15 | Disposition: A | Discharge status: Home / Self Care | Payer: Medicare Other | Attending: Emergency Medicine | Admitting: Emergency Medicine

## 2015-10-15 ENCOUNTER — Emergency Department (HOSPITAL_COMMUNITY): Payer: Medicare Other

## 2015-10-15 ENCOUNTER — Ambulatory Visit (HOSPITAL_COMMUNITY)
Admission: EM | Admit: 2015-10-15 | Discharge: 2015-10-15 | Disposition: A | Discharge status: Nursing Home (Includes ICF) | Payer: Medicare Other | Attending: Family Medicine | Admitting: Family Medicine

## 2015-10-15 ENCOUNTER — Encounter (HOSPITAL_COMMUNITY): Payer: Self-pay | Admitting: *Deleted

## 2015-10-15 ENCOUNTER — Encounter (HOSPITAL_COMMUNITY): Payer: Self-pay | Admitting: Family Medicine

## 2015-10-15 DIAGNOSIS — Z95810 Presence of automatic (implantable) cardiac defibrillator: Secondary | ICD-10-CM | POA: Diagnosis not present

## 2015-10-15 DIAGNOSIS — I5023 Acute on chronic systolic (congestive) heart failure: Secondary | ICD-10-CM | POA: Insufficient documentation

## 2015-10-15 DIAGNOSIS — R0602 Shortness of breath: Secondary | ICD-10-CM

## 2015-10-15 DIAGNOSIS — I509 Heart failure, unspecified: Secondary | ICD-10-CM

## 2015-10-15 DIAGNOSIS — I252 Old myocardial infarction: Secondary | ICD-10-CM | POA: Diagnosis not present

## 2015-10-15 DIAGNOSIS — Z7984 Long term (current) use of oral hypoglycemic drugs: Secondary | ICD-10-CM | POA: Diagnosis not present

## 2015-10-15 DIAGNOSIS — J40 Bronchitis, not specified as acute or chronic: Secondary | ICD-10-CM

## 2015-10-15 DIAGNOSIS — Z79899 Other long term (current) drug therapy: Secondary | ICD-10-CM | POA: Insufficient documentation

## 2015-10-15 DIAGNOSIS — Z7901 Long term (current) use of anticoagulants: Secondary | ICD-10-CM | POA: Insufficient documentation

## 2015-10-15 DIAGNOSIS — E1140 Type 2 diabetes mellitus with diabetic neuropathy, unspecified: Secondary | ICD-10-CM | POA: Diagnosis not present

## 2015-10-15 DIAGNOSIS — J189 Pneumonia, unspecified organism: Secondary | ICD-10-CM | POA: Insufficient documentation

## 2015-10-15 HISTORY — DX: Cardiac arrest, cause unspecified: I46.9

## 2015-10-15 LAB — CBC
HCT: 43.2 % (ref 39.0–52.0)
Hemoglobin: 14.2 g/dL (ref 13.0–17.0)
MCH: 29.1 pg (ref 26.0–34.0)
MCHC: 32.9 g/dL (ref 30.0–36.0)
MCV: 88.5 fL (ref 78.0–100.0)
PLATELETS: 128 10*3/uL — AB (ref 150–400)
RBC: 4.88 MIL/uL (ref 4.22–5.81)
RDW: 14.2 % (ref 11.5–15.5)
WBC: 7 10*3/uL (ref 4.0–10.5)

## 2015-10-15 LAB — I-STAT TROPONIN, ED: TROPONIN I, POC: 0.01 ng/mL (ref 0.00–0.08)

## 2015-10-15 LAB — BASIC METABOLIC PANEL
Anion gap: 8 (ref 5–15)
BUN: 17 mg/dL (ref 6–20)
CALCIUM: 8.6 mg/dL — AB (ref 8.9–10.3)
CO2: 23 mmol/L (ref 22–32)
CREATININE: 1.18 mg/dL (ref 0.61–1.24)
Chloride: 102 mmol/L (ref 101–111)
GFR calc non Af Amer: >60 mL/min (ref >60)
Glucose, Bld: 212 mg/dL — ABNORMAL HIGH (ref 65–99)
Potassium: 4 mmol/L (ref 3.5–5.1)
SODIUM: 133 mmol/L — AB (ref 135–145)

## 2015-10-15 LAB — BRAIN NATRIURETIC PEPTIDE: B Natriuretic Peptide: 469.9 pg/mL — ABNORMAL HIGH (ref 0.0–100.0)

## 2015-10-15 MED ORDER — AZITHROMYCIN 500 MG IV SOLR
500.0000 mg | Freq: Once | INTRAVENOUS | Status: AC
  Administered 2015-10-15: 500 mg via INTRAVENOUS
  Filled 2015-10-15: qty 500

## 2015-10-15 MED ORDER — FUROSEMIDE 10 MG/ML IJ SOLN
60.0000 mg | Freq: Once | INTRAMUSCULAR | Status: AC
  Administered 2015-10-15: 60 mg via INTRAVENOUS
  Filled 2015-10-15: qty 6

## 2015-10-15 MED ORDER — DEXTROSE 5 % IV SOLN
1.0000 g | Freq: Once | INTRAVENOUS | Status: AC
  Administered 2015-10-15: 1 g via INTRAVENOUS
  Filled 2015-10-15: qty 10

## 2015-10-15 MED ORDER — TETANUS-DIPHTH-ACELL PERTUSSIS 5-2.5-18.5 LF-MCG/0.5 IM SUSP
INTRAMUSCULAR | Status: AC
  Filled 2015-10-15: qty 0.5

## 2015-10-15 MED ORDER — CLINDAMYCIN HCL 150 MG PO CAPS: 150.0000 mg | ORAL_CAPSULE | Freq: Four times a day (QID) | ORAL | 0 refills | Status: DC

## 2015-10-15 NOTE — ED Triage Notes (Signed)
Send from [facility] for further eval of SOB, weakness, and GI upset over the past few days. [place] had an episode of syncope, which caused right side pain. Pt took Oxycodone for the pain which he thinks caused his GI upset. Pt states his syncope was due to taking metoprolol 4 times a day and his docs are not concerned about this. Over the past few days pt has increased SOB. States if he lays in bed he has 'shortness of breath' and if "I walk to the mailbox I have to stop and catch my breath". No CP

## 2015-10-15 NOTE — ED Notes (Signed)
Spoke with Dr. Winfred Leeds, Dr. Radford Pax with cardiology stated to run BNP and then determine whether to admit pt or not. Pt and family has been informed.

## 2015-10-15 NOTE — ED Notes (Signed)
Pt aware BNP resulted, EDP aware and at bedside

## 2015-10-15 NOTE — Discharge Instructions (Signed)
Follow up for chf as discussed with cardiology

## 2015-10-15 NOTE — ED Provider Notes (Signed)
[facility] DEPT Provider Note   CSN: [id number] Arrival date & time: 10/15/15  1236-05-06     History   Chief Complaint Chief Complaint  Patient presents with  . Shortness of Breath    HPI [name] is a 70 y.o. male.  HPI  70 year old male with a history significant for coronary artery disease, MI, history of cardiac arrest, status post implantable defibrillator and pacemaker who presents today with increasing dyspnea on exertion over the past week. He also reports that he has had some cough productive of the green sputum. He reports no fever or chills. The cough has improved over the past several days, however he has continued to get dyspneic with any exertion. He reports some lower extremity swelling has been noted that no lateralized swelling. He reports no history of DVT or risk factors for recurrent DVT. He denies any recent lateralized leg swelling, immobilization, or long trips. He states he has had some intermittent chest discomfort that has been present prior to the dyspnea. He denies any current chest pain or chest discomfort. He denies any history of lung disease. He did smoke in the distant past. Currently he is followed by [facility] cardiology but does have plans for ablation at [facility]. He has had catheterization January 14 with no obstruction and patent grafts.  Past Medical History:  Diagnosis Date  . CALLUS, RIGHT FOOT 12/08/2009  . Cardiac arrest ([place])   . Chronic systolic heart failure ([place]) 11/03/2009  . DIABETES MELLITUS, TYPE II 12/05/2006  . 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 HYPERLIPIDEMIA 11/25/2006  . HYPOGONADISM, MALE 12/05/2006  . Implantable cardiac defibrillator MDT 09/22/2008   Change out feb 2011, 2015  . Ischemic cardiomyopathy 09/22/2008   DES CX and RCA  70% residual LAD  Done in [place]; EF 30%;; myoviewq 2006/05/06 no ischemia  . Kidney stones   . Long term (current) use of anticoagulants 04/25/2010  . Myocardial infarction [facility]) 2004/05/06   "I died  and they brought me back"  . 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 NEPHROLITHIASIS, HX OF 02/25/2008  . Persistent atrial fibrillation ([place]) 11/25/2006   Inapp shock 05/06/10 AF VR >220 recurrent    Patient Active Problem List   Diagnosis Date Noted  . Acute bronchitis 12/05/2014  . Preventative health care 02/04/2014  . Allergic rhinitis 02/04/2014  . Insomnia 02/04/2014  . Syncope and collapse 05/15/2012  . Nephrolithiasis 04/19/2012  . Depression/anxiety 03/09/2012  . Ventricular fibrillation ([place]) 03/09/2012  . Left facial numbness 12/23/2010  . Long term current use of anticoagulant therapy 04/25/2010  . 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 CALLUS, RIGHT FOOT 12/08/2009  . SYSTOLIC HEART FAILURE, CHRONIC 11/03/2009  . CATARACT EXTRACTION, HX OF 09/22/2008  . Implantable cardiac defibrillator MDT 09/22/2008  . 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 Diabetes ([place]) 12/05/2006  . HYPOGONADISM, MALE 12/05/2006  . Hyperlipidemia 11/25/2006  . ISCHEMIC CARDIOMYOPATHY 11/25/2006  . Atrial fibrillation ([place]) 11/25/2006    Past Surgical History:  Procedure Laterality Date  . CARDIAC CATHETERIZATION     feburary 06-May-2012  . CARDIAC DEFIBRILLATOR PLACEMENT     Guidant Vitality [name]  . CARDIOVERSION N/A 04/25/2012   Procedure: CARDIOVERSION;  Surgeon: Darlin Coco, MD;  Location: [facility] ENDOSCOPY;  Service: Cardiovascular;  Laterality: N/A;  . CARDIOVERSION N/A 05/21/2012   Procedure: CARDIOVERSION;  Surgeon: Deboraha Sprang, MD;  Location: [place];  Service: Cardiovascular;  Laterality: N/A;  . CATARACT EXTRACTION    . EYE  SURGERY     cataracts  . IMPLANTABLE CARDIOVERTER DEFIBRILLATOR (ICD) GENERATOR CHANGE N/A 02/05/2014   Procedure: ICD GENERATOR CHANGE;  Surgeon: Deboraha Sprang, MD;  Location: [facility] CATH LAB;  Service: Cardiovascular;  Laterality: N/A;  . PTCA    . TONSILLECTOMY    . TONSILLECTOMY         Home Medications    Prior to  Admission medications   Medication Sig Start Date End Date Taking? Authorizing Provider  carvedilol (COREG) 12.5 MG tablet Take 1 tablet (12.5 mg total) by mouth 2 (two) times daily with a meal. Patient taking differently: Take 6.25 mg by mouth 4 (four) times daily.  02/09/15  Yes Amber Sena Slate, NP  Cyanocobalamin (B-12) 5000 MCG CAPS Take 1 tablet by mouth daily.    Yes Historical Provider, MD  fluticasone (FLONASE) 50 MCG/ACT nasal spray Instill 2 sprays into both nostrils daily. 12/23/14  Yes Biagio Borg, MD  furosemide (LASIX) 20 MG tablet TAKE 1 TABLET BY MOUTH EVERY DAY 03/06/15  Yes Deboraha Sprang, MD  glipiZIDE (GLUCOTROL XL) 10 MG 24 hr tablet TAKE 1 TABLET BY MOUTH EVERY DAY WITH BREAKFAST 07/28/15  Yes Biagio Borg, MD  lisinopril (PRINIVIL,ZESTRIL) 2.5 MG tablet TAKE 1 TABLET BY MOUTH EVERY DAY 02/10/15  Yes Deboraha Sprang, MD  loratadine (CLARITIN) 10 MG tablet Take 10 mg by mouth daily.   Yes Historical Provider, MD  Magnesium Oxide (MAG-OX 400 PO) Take 1 tablet by mouth 2 (two) times daily.   Yes Historical Provider, MD  Multiple Vitamins-Minerals (MULTIVITAMIN PO) Take 1 tablet by mouth daily.   Yes Historical Provider, MD  ranolazine (RANEXA) 500 MG 12 hr tablet Take 1 tablet (500 mg total) by mouth 2 (two) times daily. 09/08/15  Yes Deboraha Sprang, MD  TIKOSYN 500 MCG capsule TAKE 1 CAPSULE BY MOUTH 2 TIMES DAILY 02/10/15  Yes Deboraha Sprang, MD  XARELTO 20 MG TABS tablet TAKE 1 TABLET BY MOUTH EVERY DAY 08/25/15  Yes Deboraha Sprang, MD  zolpidem (AMBIEN) 5 MG tablet TAKE 1 to 2 TABLETS BY MOUTH AT BEDTIME AS NEEDED 10/08/15  Yes Biagio Borg, MD  Blood Glucose Monitoring Suppl (ONE TOUCH ULTRA SYSTEM KIT) W/DEVICE KIT 1 kit by Does not apply route once. Patient not taking: Reported on 10/15/2015 02/07/14   Biagio Borg, MD  dofetilide (TIKOSYN) 500 MCG capsule Take 1 capsule (500 mcg total) by mouth 2 (two) times daily. Patient not taking: Reported on 10/15/2015 08/18/14   Deboraha Sprang, MD  glucose blood (ONE TOUCH TEST STRIPS) test strip Use as instructed up to four times per day   250.02 Patient not taking: Reported on 10/15/2015 02/07/14   Biagio Borg, MD  Lancets MISC Use as directed up to 4 times per day Patient not taking: Reported on 10/15/2015 02/07/14   Biagio Borg, MD  nitroGLYCERIN (NITROSTAT) 0.4 MG SL tablet Place 1 tablet (0.4 mg total) under the tongue every 5 (five) minutes as needed for chest pain. 05/22/15   Almyra Deforest, PA    Family History Family History  Problem Relation Age of Onset  . Diabetes Maternal Grandmother   . Hyperlipidemia Maternal Grandmother   . Breast cancer Mother   . Cancer Neg Hx   . COPD Neg Hx   . Heart disease Neg Hx     Social History Social History  Substance Use Topics  . Smoking status: Never Smoker  . Smokeless tobacco:  Never Used  . Alcohol use 0.0 oz/week     Comment: summer      Allergies   Salmon [fish allergy]   Review of Systems Review of Systems  All other systems reviewed and are negative.    Physical Exam Updated Vital Signs BP 123/96   Pulse 82   Resp 20   SpO2 98%   Physical Exam  Constitutional: He is oriented to person, place, and time. He appears well-developed and well-nourished. No distress.  HENT:  Head: Normocephalic and atraumatic.  Right Ear: External ear normal.  Left Ear: External ear normal.  Nose: Nose normal.  Mouth/Throat: Oropharynx is clear and moist.  Eyes: EOM are normal. Pupils are equal, round, and reactive to light.  Neck: Normal range of motion. Neck supple.  Cardiovascular: An irregularly irregular rhythm present.  Pulmonary/Chest: Effort normal. No respiratory distress.  Coarse breath sounds throughout, no wheezing, good air movement  Abdominal: Soft. Bowel sounds are normal.  Musculoskeletal: He exhibits no edema or tenderness.  Neurological: He is alert and oriented to person, place, and time. He has normal reflexes.  Skin: Skin is warm and dry.  Capillary refill takes less than 2 seconds.  Psychiatric: He has a normal mood and affect.  Nursing note and vitals reviewed.    ED Treatments / Results  Labs (all labs ordered are listed, but only abnormal results are displayed) Labs Reviewed  BASIC METABOLIC PANEL - Abnormal; Notable for the following:       Result Value   Sodium 133 (*)    Glucose, Bld 212 (*)    Calcium 8.6 (*)    All other components within normal limits  CBC - Abnormal; Notable for the following:    Platelets 128 (*)    All other components within normal limits  I-STAT TROPOININ, ED    EKG  EKG Interpretation  Date/Time:  Thursday October 15 2015 12:58:12 EDT Ventricular Rate:  84 PR Interval:    QRS Duration: 160 QT Interval:  430 QTC Calculation: 508 R Axis:   -3 Text Interpretation:  Atrial fibrillation Left bundle branch block Abnormal ECG continues with wandering baseline  but appears unchanged from 02/28/12 Confirmed by Ziyan Schoon MD, Andee Poles [phone]) on 10/15/2015 1:04:42 PM Also confirmed by Naziya Hegwood MD, Andee Poles [phone]), editor Hartman, Joelene Millin [phone])  on 10/15/2015 3:19:23 PM       Radiology Dg Chest Portable 1 View  Result Date: 10/15/2015 CLINICAL DATA:  Shortness of breath. Nausea and constipation for 2 weeks. History of hypertension and diabetes. EXAM: PORTABLE CHEST 1 VIEW COMPARISON:  Chest x-rays dated 12/10/2014 and 06/16/2011. FINDINGS: Mild cardiomegaly. Atherosclerotic changes again noted at the aortic arch. Left chest wall pacemaker/ICD stable in position. There is central pulmonary vascular congestion, likely superimposed on prominent pulmonary arteries related to pulmonary artery hypertension. There is associated mild bibasilar interstitial edema, right greater than left. No pleural effusion or pneumothorax seen. IMPRESSION: 1. Mild cardiomegaly with central pulmonary vascular congestion, right greater than left, compatible with CHF/volume overload. Slightly more confluent opacity at the right  lung base is likely associated edema, less likely pneumonia in the absence of fever. 2. Aortic atherosclerosis. Electronically Signed   By: Franki Cabot M.D.   On: 10/15/2015 14:25    Procedures Procedures (including critical care time)  Medications Ordered in ED Medications  furosemide (LASIX) injection 60 mg (not administered)     Initial Impression / Assessment and Plan / ED Course  I have reviewed the triage vital signs and  the nursing notes.  Pertinent labs & imaging results that were available during my care of the patient were reviewed by me and considered in my medical decision making (see chart for details).  Clinical Course    1- dyspnea- some chf, cannot rule out pneumonia on cxr,  Lasix iv ordered.  Given recent produtctive cough and ? Infiltrate will treat with antibiotics.  Rocephin and zithromax given here.  May be discharged on po clindamycin if cleared to be discharged by cardiology.  2- a flutter with some v tach- patient with defibrillator /pacemaker- currently interrogating.  Cardiology paged to assess .  EKG without stemi and troponin normal. 3-hyponatremia-mild 4-hyperglycemia- known diabetes    Discussed with Trish on for cardiology and they will see and assess. Check pacer interrogation. I Final Clinical Impressions(s) / ED Diagnoses   Final diagnoses:  Acute on chronic congestive heart failure, unspecified congestive heart failure type ([place])  CAP (community acquired pneumonia)    New Prescriptions New Prescriptions   No medications on file  Discussed with Dr. Winfred Leeds and he will review cardiology's reccomendations and disposition patient.    Pattricia Boss, MD 10/16/15 [phone]

## 2015-10-15 NOTE — ED Notes (Signed)
Called main lab for BNP results, states that they are still running the blood. EDP aware, pt aware.

## 2015-10-15 NOTE — ED Notes (Signed)
Spoke with Cardiology about seeing/admitting pt, stated that they spoke with Dr. Winfred Leeds earlier and decided that pt condition sounds more like pneumonia rather than cardiac related and cardiology would not be admitting. EDP aware. Pt also very agitated and upset with how long it is taking to be admitted.

## 2015-10-15 NOTE — ED Provider Notes (Signed)
[facility]    CSN: [id number] Arrival date & time: 10/15/15  1040  First Provider Contact:  None       History   Chief Complaint Chief Complaint  Patient presents with  . Shortness of Breath    HPI [name] is a 70 y.o. male.   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 Jenny Reichmann and [facility] physician's, who presents with shortness of breath. He's coming by his daughter who is quite concerned about him.  Patient has a extensive history of heart disease including past cardiac arrest, stents, implanted defibrillator, pacemaker. He also has chronic atrial fibrillation and an ejection fraction 27%.  Patient states that he has a remote history of smoking. Has a chronic productive cough with green sputum over the last 10 months. This is intermittent but is present majority of the days.  Over the last week he's developed an increasing amount of sputum as well as shortness of breath. He now finds himself gasping when he walks to the car.  Patient had no hemoptysis, fever, night sweats, or significant ankle edema.  He does have some chest tightness at times.  Recently fell and bruised his right lower ribs posteriorly. He took Percocet and since then he has been constipated as well.  Past Medical History:  Diagnosis Date  . CALLUS, RIGHT FOOT 12/08/2009  . Chronic systolic heart failure ([place]) 11/03/2009  . DIABETES MELLITUS, TYPE II 12/05/2006  . 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 HYPERLIPIDEMIA 11/25/2006  . HYPOGONADISM, MALE 12/05/2006  . Implantable cardiac defibrillator MDT 09/22/2008   Change out feb 2011, 2015  . Ischemic cardiomyopathy 09/22/2008   DES CX and RCA  70% residual LAD  Done in [place]; EF 30%;; myoviewq 22-Apr-2006 no ischemia  . Kidney stones   . Long term (current) use of anticoagulants 04/25/2010  . Myocardial infarction [facility]) 04/22/2004   "I died and they brought me back"  . 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   . NEPHROLITHIASIS, HX OF 02/25/2008  . Persistent atrial fibrillation ([place]) 11/25/2006   Inapp shock 2010/04/22 AF VR >220 recurrent    Patient Active Problem List   Diagnosis Date Noted  . Acute bronchitis 12/05/2014  . Preventative health care 02/04/2014  . Allergic rhinitis 02/04/2014  . Insomnia 02/04/2014  . Syncope and collapse 05/15/2012  . Nephrolithiasis 04/19/2012  . Depression/anxiety 03/09/2012  . Ventricular fibrillation ([place]) 03/09/2012  . Left facial numbness 12/23/2010  . Long term current use of anticoagulant therapy 04/25/2010  . 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 CALLUS, RIGHT FOOT 12/08/2009  . SYSTOLIC HEART FAILURE, CHRONIC 11/03/2009  . CATARACT EXTRACTION, HX OF 09/22/2008  . Implantable cardiac defibrillator MDT 09/22/2008  . 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 Diabetes ([place]) 12/05/2006  . HYPOGONADISM, MALE 12/05/2006  . Hyperlipidemia 11/25/2006  . ISCHEMIC CARDIOMYOPATHY 11/25/2006  . Atrial fibrillation ([place]) 11/25/2006    Past Surgical History:  Procedure Laterality Date  . CARDIAC CATHETERIZATION     feburary Apr 22, 2012  . CARDIAC DEFIBRILLATOR PLACEMENT     Guidant Vitality [name]  . CARDIOVERSION N/A 04/25/2012   Procedure: CARDIOVERSION;  Surgeon: Darlin Coco, MD;  [facility];  Service: Cardiovascular;  Laterality: N/A;  . CARDIOVERSION N/A 05/21/2012   Procedure: CARDIOVERSION;  Surgeon: Deboraha Sprang, MD;  Location: [place];  Service: Cardiovascular;  Laterality: N/A;  . CATARACT EXTRACTION    . EYE SURGERY     cataracts  .  IMPLANTABLE CARDIOVERTER DEFIBRILLATOR (ICD) GENERATOR CHANGE N/A 02/05/2014   Procedure: ICD GENERATOR CHANGE;  Surgeon: Deboraha Sprang, MD;  Location: [facility] CATH LAB;  Service: Cardiovascular;  Laterality: N/A;  . PTCA    . TONSILLECTOMY    . TONSILLECTOMY         Home Medications    Prior to Admission medications   Medication Sig Start Date End Date Taking? Authorizing  Provider  Blood Glucose Monitoring Suppl (ONE TOUCH ULTRA SYSTEM KIT) W/DEVICE KIT 1 kit by Does not apply route once. 02/07/14   Biagio Borg, MD  carvedilol (COREG) 12.5 MG tablet Take 1 tablet (12.5 mg total) by mouth 2 (two) times daily with a meal. 02/09/15   Amber Sena Slate, NP  Cyanocobalamin (B-12) 5000 MCG CAPS Take 1 tablet by mouth daily.     Historical Provider, MD  dofetilide (TIKOSYN) 500 MCG capsule Take 1 capsule (500 mcg total) by mouth 2 (two) times daily. 08/18/14   Deboraha Sprang, MD  fluticasone (FLONASE) 50 MCG/ACT nasal spray Instill 2 sprays into both nostrils daily. 12/23/14   Biagio Borg, MD  furosemide (LASIX) 20 MG tablet TAKE 1 TABLET BY MOUTH EVERY DAY 03/06/15   Deboraha Sprang, MD  glipiZIDE (GLUCOTROL XL) 10 MG 24 hr tablet TAKE 1 TABLET BY MOUTH EVERY DAY WITH BREAKFAST 07/28/15   Biagio Borg, MD  glucose blood (ONE TOUCH TEST STRIPS) test strip Use as instructed up to four times per day   250.02 02/07/14   Biagio Borg, MD  Lancets MISC Use as directed up to 4 times per day 02/07/14   Biagio Borg, MD  lisinopril (PRINIVIL,ZESTRIL) 2.5 MG tablet TAKE 1 TABLET BY MOUTH EVERY DAY 02/10/15   Deboraha Sprang, MD  loratadine (CLARITIN) 10 MG tablet Take 10 mg by mouth daily.    Historical Provider, MD  Magnesium Oxide (MAG-OX 400 PO) Take 1 tablet by mouth 2 (two) times daily.    Historical Provider, MD  Multiple Vitamins-Minerals (MULTIVITAMIN PO) Take 1 tablet by mouth daily.    Historical Provider, MD  nitroGLYCERIN (NITROSTAT) 0.4 MG SL tablet Place 1 tablet (0.4 mg total) under the tongue every 5 (five) minutes as needed for chest pain. 05/22/15   Almyra Deforest, PA  ranolazine (RANEXA) 500 MG 12 hr tablet Take 1 tablet (500 mg total) by mouth 2 (two) times daily. 09/08/15   Deboraha Sprang, MD  TIKOSYN 500 MCG capsule TAKE 1 CAPSULE BY MOUTH 2 TIMES DAILY 02/10/15   Deboraha Sprang, MD  XARELTO 20 MG TABS tablet TAKE 1 TABLET BY MOUTH EVERY DAY 08/25/15   Deboraha Sprang, MD    zolpidem (AMBIEN) 5 MG tablet TAKE 1 to 2 TABLETS BY MOUTH AT BEDTIME AS NEEDED 10/08/15   Biagio Borg, MD    Family History Family History  Problem Relation Age of Onset  . Diabetes Maternal Grandmother   . Hyperlipidemia Maternal Grandmother   . Breast cancer Mother   . Cancer Neg Hx   . COPD Neg Hx   . Heart disease Neg Hx     Social History Social History  Substance Use Topics  . Smoking status: Never Smoker  . Smokeless tobacco: Never Used  . Alcohol use 0.0 oz/week     Comment: summer      Allergies   Salmon [fish allergy]   Review of Systems Review of Systems   Physical Exam Triage Vital Signs ED Triage Vitals [10/15/15  1128]  Enc Vitals Group     BP 112/80     Pulse Rate 98     Resp 20     Temp 97.6 F (36.4 C)     Temp src      SpO2 98 %     Weight      Height      Head Circumference      Peak Flow      Pain Score      Pain Loc      Pain Edu?      Excl. in [place]?    No data found.   Updated Vital Signs BP 112/80   Pulse 98   Temp 97.6 F (36.4 C)   Resp 20   SpO2 98%       Physical Exam Patient is obviously having labored breathing at rest. His neck veins are flat. HEENT: Unremarkable Neck: Supple no adenopathy Chest: Patient has wet rales bilaterally. There is an occasional expiratory wheeze. Heart: Irregularly irregular, no murmur Abdomen: Somewhat protuberant, nontender Extremities: Trace edema both ankles Skin: No lesions or rashes noted  UC Treatments / Results  Labs (all labs ordered are listed, but only abnormal results are displayed) Labs Reviewed - No data to display  EKG  EKG Interpretation None       Radiology No results found.  Procedures Procedures (including critical care time)  Medications Ordered in UC Medications - No data to display   Initial Impression / Assessment and Plan / UC Course  I have reviewed the triage vital signs and the nursing notes.  Pertinent labs & imaging results that were  available during my care of the patient were reviewed by me and considered in my medical decision making (see chart for details).  Clinical Course  Past medical history, problem list, allergies, and medications were reviewed    Final Clinical Impressions(s) / UC Diagnoses   Final diagnoses:  Bronchitis  SOB (shortness of breath)  It's difficult to separate out how much of this is pulmonary and how much is cardiac. He'll need more evaluation in the emergency department before he can go back home.  New Prescriptions New Prescriptions   No medications on file  I'm transferring the patient to the emergency department for further cardiac and pulmonary evaluation.  Signed, Robyn Haber M.D.   Robyn Haber, MD 10/15/15 [phone]

## 2015-10-15 NOTE — ED Triage Notes (Signed)
Pt here for SOB, weakness, chest discomfort that hs been getting worse over the past 2 weeks. sts that he has been coughing up green sputum at times. Pt significant cardiac hx.

## 2015-10-15 NOTE — ED Provider Notes (Signed)
Patient was signed out to me at 3:30 PM. Resting comfortably. At 8:45 PM he continues to rest comfortably. He denies any dyspnea. He is able lie flat without difficulty. I spoke with Dr. Tommi Rumps, cardiologist. He feels that in light of patient's low ejection fraction BNP is largely irrelevant and patient should be evaluated clinically for CHF. I don't feel that he is in frank congestive heart failure given the fact that he can lie flat, has no peripheral edema. Lung exam with scant diffuse rails. Plan discharge to home.    Orlie Dakin, MD 10/15/15 2053

## 2015-10-16 DIAGNOSIS — I5022 Chronic systolic (congestive) heart failure: Secondary | ICD-10-CM | POA: Diagnosis not present

## 2015-10-16 DIAGNOSIS — Z955 Presence of coronary angioplasty implant and graft: Secondary | ICD-10-CM | POA: Diagnosis not present

## 2015-10-16 DIAGNOSIS — R9431 Abnormal electrocardiogram [ECG] [EKG]: Secondary | ICD-10-CM | POA: Diagnosis not present

## 2015-10-16 DIAGNOSIS — I2510 Atherosclerotic heart disease of native coronary artery without angina pectoris: Secondary | ICD-10-CM | POA: Diagnosis not present

## 2015-10-16 DIAGNOSIS — I5020 Unspecified systolic (congestive) heart failure: Secondary | ICD-10-CM | POA: Diagnosis not present

## 2015-10-16 DIAGNOSIS — Z6826 Body mass index (BMI) 26.0-26.9, adult: Secondary | ICD-10-CM | POA: Diagnosis not present

## 2015-10-16 DIAGNOSIS — I481 Persistent atrial fibrillation: Secondary | ICD-10-CM | POA: Diagnosis not present

## 2015-10-16 DIAGNOSIS — I255 Ischemic cardiomyopathy: Secondary | ICD-10-CM | POA: Diagnosis not present

## 2015-10-16 DIAGNOSIS — E119 Type 2 diabetes mellitus without complications: Secondary | ICD-10-CM | POA: Diagnosis not present

## 2015-10-16 DIAGNOSIS — I252 Old myocardial infarction: Secondary | ICD-10-CM | POA: Diagnosis not present

## 2015-10-16 DIAGNOSIS — Z95810 Presence of automatic (implantable) cardiac defibrillator: Secondary | ICD-10-CM | POA: Diagnosis not present

## 2015-10-16 DIAGNOSIS — I110 Hypertensive heart disease with heart failure: Secondary | ICD-10-CM | POA: Diagnosis not present

## 2015-10-16 DIAGNOSIS — I4891 Unspecified atrial fibrillation: Secondary | ICD-10-CM | POA: Diagnosis not present

## 2015-10-19 ENCOUNTER — Other Ambulatory Visit: Payer: Self-pay | Admitting: Internal Medicine

## 2015-11-16 ENCOUNTER — Other Ambulatory Visit: Payer: Self-pay | Admitting: Internal Medicine

## 2015-11-19 ENCOUNTER — Ambulatory Visit (INDEPENDENT_AMBULATORY_CARE_PROVIDER_SITE_OTHER): Payer: Medicare Other | Admitting: Internal Medicine

## 2015-11-19 ENCOUNTER — Encounter: Payer: Self-pay | Admitting: Internal Medicine

## 2015-11-19 VITALS — BP 128/76 | HR 90 | Temp 98.9°F | Resp 20 | Wt 190.0 lb

## 2015-11-19 DIAGNOSIS — E0842 Diabetes mellitus due to underlying condition with diabetic polyneuropathy: Secondary | ICD-10-CM | POA: Diagnosis not present

## 2015-11-19 DIAGNOSIS — R059 Cough, unspecified: Secondary | ICD-10-CM

## 2015-11-19 DIAGNOSIS — R05 Cough: Secondary | ICD-10-CM | POA: Diagnosis not present

## 2015-11-19 DIAGNOSIS — R062 Wheezing: Secondary | ICD-10-CM | POA: Diagnosis not present

## 2015-11-19 MED ORDER — HYDROCODONE-HOMATROPINE 5-1.5 MG/5ML PO SYRP: 5.0000 mL | ORAL_SOLUTION | Freq: Four times a day (QID) | ORAL | 0 refills | Status: AC | PRN

## 2015-11-19 MED ORDER — DOXYCYCLINE HYCLATE 100 MG PO TABS: 100.0000 mg | ORAL_TABLET | Freq: Two times a day (BID) | ORAL | 0 refills | Status: DC

## 2015-11-19 NOTE — Progress Notes (Signed)
Pre visit review using our clinic review tool, if applicable. No additional management support is needed unless otherwise documented below in the visit note. 

## 2015-11-19 NOTE — Progress Notes (Signed)
Subjective:    Patient ID: [name], male    DOB: 15-Apr-1945, 70 y.o.   MRN: [id number]  HPI  Here with acute onset mild to mod 2-3 days ST, HA, general weakness and malaise, with prod cough greenish sputum, but Pt denies chest pain, increased sob or doe, wheezing, orthopnea, PND, increased LE swelling, palpitations, dizziness or syncope, except for onset mild wheeze and sob since last night.  Very concerned as he is for ablation next wk.  Pt denies new neurological symptoms such as new headache, or facial or extremity weakness or numbness   Pt denies polydipsia, polyuria, Past Medical History:  Diagnosis Date  . CALLUS, RIGHT FOOT 12/08/2009  . Cardiac arrest ([place])   . Chronic systolic heart failure ([place]) 11/03/2009  . DIABETES MELLITUS, TYPE II 12/05/2006  . 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 HYPERLIPIDEMIA 11/25/2006  . HYPOGONADISM, MALE 12/05/2006  . Implantable cardiac defibrillator MDT 09/22/2008   Change out feb 2011, 2015  . Ischemic cardiomyopathy 09/22/2008   DES CX and RCA  70% residual LAD  Done in [place]; EF 30%;; myoviewq 2006/05/05 no ischemia  . Kidney stones   . Long term (current) use of anticoagulants 04/25/2010  . Myocardial infarction [facility]) 05/05/2004   "I died and they brought me back"  . 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 NEPHROLITHIASIS, HX OF 02/25/2008  . Persistent atrial fibrillation ([place]) 11/25/2006   Inapp shock May 05, 2010 AF VR >220 recurrent   Past Surgical History:  Procedure Laterality Date  . CARDIAC CATHETERIZATION     feburary 2012/05/05  . CARDIAC DEFIBRILLATOR PLACEMENT     Guidant Vitality [name]  . CARDIOVERSION N/A 04/25/2012   Procedure: CARDIOVERSION;  Surgeon: Darlin Coco, MD;  [facility] ENDOSCOPY;  Service: Cardiovascular;  Laterality: N/A;  . CARDIOVERSION N/A 05/21/2012   Procedure: CARDIOVERSION;  Surgeon: Deboraha Sprang, MD;  Location: [place];  Service: Cardiovascular;  Laterality: N/A;  . CATARACT EXTRACTION    . EYE  SURGERY     cataracts  . IMPLANTABLE CARDIOVERTER DEFIBRILLATOR (ICD) GENERATOR CHANGE N/A 02/05/2014   Procedure: ICD GENERATOR CHANGE;  Surgeon: Deboraha Sprang, MD;  Location: [facility] CATH LAB;  Service: Cardiovascular;  Laterality: N/A;  . PTCA    . TONSILLECTOMY    . TONSILLECTOMY      reports that he has never smoked. He has never used smokeless tobacco. He reports that he drinks alcohol. He reports that he does not use drugs. family history includes Breast cancer in his mother; Diabetes in his maternal grandmother; Hyperlipidemia in his maternal grandmother. Allergies  Allergen Reactions  . Salmon [Fish Allergy] Other (See Comments)    Only canned/ not fresh (probably preservatives).    Current Outpatient Prescriptions on File Prior to Visit  Medication Sig Dispense Refill  . Blood Glucose Monitoring Suppl (ONE TOUCH ULTRA SYSTEM KIT) W/DEVICE KIT 1 kit by Does not apply route once. 1 each 0  . carvedilol (COREG) 12.5 MG tablet Take 1 tablet (12.5 mg total) by mouth 2 (two) times daily with a meal. (Patient taking differently: Take 6.25 mg by mouth 4 (four) times daily. ) 180 tablet 5  . Cyanocobalamin (B-12) 5000 MCG CAPS Take 1 tablet by mouth daily.     Marland Kitchen dofetilide (TIKOSYN) 500 MCG capsule Take 1 capsule (500 mcg total) by mouth 2 (two) times daily. 60 capsule 4  . fluticasone (FLONASE) 50 MCG/ACT nasal spray Instill 2 sprays into both nostrils daily. 16 g 5  .  furosemide (LASIX) 20 MG tablet TAKE 1 TABLET BY MOUTH EVERY DAY 30 tablet 10  . glipiZIDE (GLUCOTROL XL) 10 MG 24 hr tablet TAKE 1 TABLET BY MOUTH EVERY DAY WITH BREAKFAST 30 tablet 3  . glucose blood (ONE TOUCH TEST STRIPS) test strip Use as instructed up to four times per day   250.02 100 each 12  . Lancets MISC Use as directed up to 4 times per day 100 each 11  . lisinopril (PRINIVIL,ZESTRIL) 2.5 MG tablet TAKE 1 TABLET BY MOUTH EVERY DAY 30 tablet 10  . loratadine (CLARITIN) 10 MG tablet Take 10 mg by mouth daily.    .  Magnesium Oxide (MAG-OX 400 PO) Take 1 tablet by mouth 2 (two) times daily.    . Multiple Vitamins-Minerals (MULTIVITAMIN PO) Take 1 tablet by mouth daily.    . nitroGLYCERIN (NITROSTAT) 0.4 MG SL tablet Place 1 tablet (0.4 mg total) under the tongue every 5 (five) minutes as needed for chest pain. 25 tablet 3  . ranolazine (RANEXA) 500 MG 12 hr tablet Take 1 tablet (500 mg total) by mouth 2 (two) times daily. 60 tablet 6  . TIKOSYN 500 MCG capsule TAKE 1 CAPSULE BY MOUTH 2 TIMES DAILY 60 capsule 11  . XARELTO 20 MG TABS tablet TAKE 1 TABLET BY MOUTH EVERY DAY 30 tablet 5  . zolpidem (AMBIEN) 5 MG tablet TAKE 1 to 2 TABLETS BY MOUTH AT BEDTIME AS NEEDED 60 tablet 0   No current facility-administered medications on file prior to visit.     Review of Systems  Constitutional: Negative for unusual diaphoresis or night sweats HENT: Negative for ear swelling or discharge Eyes: Negative for worsening visual haziness  Respiratory: Negative for choking and stridor.   Gastrointestinal: Negative for distension or worsening eructation Genitourinary: Negative for retention or change in urine volume.  Musculoskeletal: Negative for other MSK pain or swelling Skin: Negative for color change and worsening wound Neurological: Negative for tremors and numbness other than noted  Psychiatric/Behavioral: Negative for decreased concentration or agitation other than above       Objective:   Physical Exam BP 128/76   Pulse 90   Temp 98.9 F (37.2 C) (Oral)   Resp 20   Wt 190 lb (86.2 kg)   SpO2 94%   BMI 27.26 kg/m  VS noted, mild ill Constitutional: Pt appears in no apparent distress HENT: Head: NCAT.  Right Ear: External ear normal.  Left Ear: External ear normal.  Eyes: . Pupils are equal, round, and reactive to light. Conjunctivae and EOM are normal Bilat tm's with mild erythema.  Max sinus areas mild tender.  Pharynx with mild erythema, no exudate Neck: Normal range of motion. Neck supple.    Cardiovascular: Normal rate and regular rhythm.   Pulmonary/Chest: Effort normal and breath sounds decreased without rales but with few scattered wheezing.  Neurological: Pt is alert. Not confused , motor grossly intact Skin: Skin is warm. No rash, no LE edema Psychiatric: Pt behavior is normal. No agitation.     Assessment & Plan:

## 2015-11-19 NOTE — Patient Instructions (Signed)
You had the flu shot today, and the steroid shot  Please take all new medication as prescribed - the antibiotic, and the cough medicine  Please continue all other medications as before, and refills have been done if requested.  Please have the pharmacy call with any other refills you may need.  Please keep your appointments with your specialists as you may have planned

## 2015-11-21 DIAGNOSIS — R05 Cough: Secondary | ICD-10-CM | POA: Insufficient documentation

## 2015-11-21 DIAGNOSIS — R059 Cough, unspecified: Secondary | ICD-10-CM | POA: Insufficient documentation

## 2015-11-21 DIAGNOSIS — R062 Wheezing: Secondary | ICD-10-CM | POA: Insufficient documentation

## 2015-11-21 NOTE — Assessment & Plan Note (Signed)
Mild to mod, c/w bornchitis vs pna, declines cxr, for antibx course, cough med prn,  to f/u any worsening symptoms or concerns °

## 2015-11-21 NOTE — Assessment & Plan Note (Signed)
stable overall by history and exam, recent data reviewed with pt, and pt to continue medical treatment as before,  to f/u any worsening symptoms or concerns Lab Results  Component Value Date   HGBA1C 7.9 (H) 10/22/2014   Pt to call for onset polys or cbg > 200

## 2015-11-21 NOTE — Assessment & Plan Note (Signed)
Mild to mod, likely infectious related, for depomedrol IM, predpac asd, ,  to f/u any worsening symptoms or concerns

## 2015-11-25 DIAGNOSIS — I4901 Ventricular fibrillation: Secondary | ICD-10-CM | POA: Diagnosis not present

## 2015-11-25 DIAGNOSIS — I4891 Unspecified atrial fibrillation: Secondary | ICD-10-CM | POA: Diagnosis not present

## 2015-11-25 DIAGNOSIS — E118 Type 2 diabetes mellitus with unspecified complications: Secondary | ICD-10-CM | POA: Diagnosis not present

## 2015-11-25 DIAGNOSIS — J40 Bronchitis, not specified as acute or chronic: Secondary | ICD-10-CM | POA: Diagnosis not present

## 2015-11-25 DIAGNOSIS — I5022 Chronic systolic (congestive) heart failure: Secondary | ICD-10-CM | POA: Diagnosis not present

## 2015-11-25 DIAGNOSIS — I480 Paroxysmal atrial fibrillation: Secondary | ICD-10-CM | POA: Diagnosis not present

## 2015-11-25 DIAGNOSIS — J90 Pleural effusion, not elsewhere classified: Secondary | ICD-10-CM | POA: Diagnosis not present

## 2015-11-25 DIAGNOSIS — J9811 Atelectasis: Secondary | ICD-10-CM | POA: Diagnosis not present

## 2015-11-25 DIAGNOSIS — Z01818 Encounter for other preprocedural examination: Secondary | ICD-10-CM | POA: Diagnosis not present

## 2015-11-25 DIAGNOSIS — Z9889 Other specified postprocedural states: Secondary | ICD-10-CM | POA: Diagnosis not present

## 2015-11-26 DIAGNOSIS — Z955 Presence of coronary angioplasty implant and graft: Secondary | ICD-10-CM | POA: Diagnosis not present

## 2015-11-26 DIAGNOSIS — J40 Bronchitis, not specified as acute or chronic: Secondary | ICD-10-CM | POA: Diagnosis not present

## 2015-11-26 DIAGNOSIS — E118 Type 2 diabetes mellitus with unspecified complications: Secondary | ICD-10-CM | POA: Diagnosis not present

## 2015-11-26 DIAGNOSIS — I447 Left bundle-branch block, unspecified: Secondary | ICD-10-CM | POA: Diagnosis not present

## 2015-11-26 DIAGNOSIS — R9431 Abnormal electrocardiogram [ECG] [EKG]: Secondary | ICD-10-CM | POA: Diagnosis not present

## 2015-11-26 DIAGNOSIS — Z7901 Long term (current) use of anticoagulants: Secondary | ICD-10-CM | POA: Diagnosis not present

## 2015-11-26 DIAGNOSIS — I493 Ventricular premature depolarization: Secondary | ICD-10-CM | POA: Diagnosis not present

## 2015-11-26 DIAGNOSIS — I4901 Ventricular fibrillation: Secondary | ICD-10-CM | POA: Diagnosis not present

## 2015-11-26 DIAGNOSIS — I110 Hypertensive heart disease with heart failure: Secondary | ICD-10-CM | POA: Diagnosis not present

## 2015-11-26 DIAGNOSIS — I5022 Chronic systolic (congestive) heart failure: Secondary | ICD-10-CM | POA: Diagnosis not present

## 2015-11-26 DIAGNOSIS — Z7984 Long term (current) use of oral hypoglycemic drugs: Secondary | ICD-10-CM | POA: Diagnosis not present

## 2015-11-26 DIAGNOSIS — I440 Atrioventricular block, first degree: Secondary | ICD-10-CM | POA: Diagnosis not present

## 2015-11-26 DIAGNOSIS — I4891 Unspecified atrial fibrillation: Secondary | ICD-10-CM | POA: Diagnosis not present

## 2015-11-26 DIAGNOSIS — I481 Persistent atrial fibrillation: Secondary | ICD-10-CM | POA: Diagnosis not present

## 2015-11-26 DIAGNOSIS — I4589 Other specified conduction disorders: Secondary | ICD-10-CM | POA: Diagnosis not present

## 2015-11-26 DIAGNOSIS — I2510 Atherosclerotic heart disease of native coronary artery without angina pectoris: Secondary | ICD-10-CM | POA: Diagnosis not present

## 2015-12-08 ENCOUNTER — Telehealth: Payer: Self-pay | Admitting: Cardiology

## 2015-12-08 ENCOUNTER — Encounter: Payer: Medicare Other | Admitting: *Deleted

## 2015-12-08 DIAGNOSIS — Z45018 Encounter for adjustment and management of other part of cardiac pacemaker: Secondary | ICD-10-CM | POA: Diagnosis not present

## 2015-12-08 DIAGNOSIS — I509 Heart failure, unspecified: Secondary | ICD-10-CM | POA: Diagnosis not present

## 2015-12-08 NOTE — Telephone Encounter (Signed)
Pt is now being followed by [facility] --- remote appt not needed.

## 2015-12-22 ENCOUNTER — Other Ambulatory Visit: Payer: Self-pay | Admitting: Internal Medicine

## 2015-12-22 NOTE — Telephone Encounter (Signed)
Done hardcopy to Corinne  

## 2015-12-23 NOTE — Telephone Encounter (Signed)
faxed

## 2015-12-24 ENCOUNTER — Emergency Department (HOSPITAL_COMMUNITY): Payer: Medicare Other

## 2015-12-24 ENCOUNTER — Telehealth: Payer: Self-pay | Admitting: Emergency Medicine

## 2015-12-24 ENCOUNTER — Encounter (HOSPITAL_COMMUNITY): Payer: Self-pay | Admitting: Emergency Medicine

## 2015-12-24 ENCOUNTER — Emergency Department (HOSPITAL_COMMUNITY)
Admission: EM | Admit: 2015-12-24 | Discharge: 2015-12-24 | Disposition: A | Discharge status: Home / Self Care | Payer: Medicare Other | Attending: Emergency Medicine | Admitting: Emergency Medicine

## 2015-12-24 DIAGNOSIS — Z7984 Long term (current) use of oral hypoglycemic drugs: Secondary | ICD-10-CM | POA: Insufficient documentation

## 2015-12-24 DIAGNOSIS — G4700 Insomnia, unspecified: Secondary | ICD-10-CM | POA: Diagnosis not present

## 2015-12-24 DIAGNOSIS — Z95810 Presence of automatic (implantable) cardiac defibrillator: Secondary | ICD-10-CM | POA: Insufficient documentation

## 2015-12-24 DIAGNOSIS — Z85828 Personal history of other malignant neoplasm of skin: Secondary | ICD-10-CM | POA: Insufficient documentation

## 2015-12-24 DIAGNOSIS — I252 Old myocardial infarction: Secondary | ICD-10-CM | POA: Diagnosis not present

## 2015-12-24 DIAGNOSIS — Z79899 Other long term (current) drug therapy: Secondary | ICD-10-CM | POA: Diagnosis not present

## 2015-12-24 DIAGNOSIS — R259 Unspecified abnormal involuntary movements: Secondary | ICD-10-CM

## 2015-12-24 DIAGNOSIS — E1140 Type 2 diabetes mellitus with diabetic neuropathy, unspecified: Secondary | ICD-10-CM | POA: Diagnosis not present

## 2015-12-24 DIAGNOSIS — Z7901 Long term (current) use of anticoagulants: Secondary | ICD-10-CM | POA: Insufficient documentation

## 2015-12-24 DIAGNOSIS — I110 Hypertensive heart disease with heart failure: Secondary | ICD-10-CM | POA: Diagnosis not present

## 2015-12-24 DIAGNOSIS — I5022 Chronic systolic (congestive) heart failure: Secondary | ICD-10-CM | POA: Diagnosis not present

## 2015-12-24 DIAGNOSIS — R253 Fasciculation: Secondary | ICD-10-CM | POA: Diagnosis present

## 2015-12-24 DIAGNOSIS — R200 Anesthesia of skin: Secondary | ICD-10-CM | POA: Diagnosis not present

## 2015-12-24 LAB — DIFFERENTIAL
Basophils Absolute: 0 10*3/uL (ref 0.0–0.1)
Basophils Relative: 0 %
Eosinophils Absolute: 0.3 10*3/uL (ref 0.0–0.7)
Eosinophils Relative: 4 %
Lymphocytes Relative: 16 %
Lymphs Abs: 1 10*3/uL (ref 0.7–4.0)
Monocytes Absolute: 0.7 10*3/uL (ref 0.1–1.0)
Monocytes Relative: 11 %
Neutro Abs: 4.6 10*3/uL (ref 1.7–7.7)
Neutrophils Relative %: 69 %

## 2015-12-24 LAB — URINALYSIS, ROUTINE W REFLEX MICROSCOPIC
Bilirubin Urine: NEGATIVE
GLUCOSE, UA: NEGATIVE mg/dL
Hgb urine dipstick: NEGATIVE
Ketones, ur: NEGATIVE mg/dL
LEUKOCYTES UA: NEGATIVE
Nitrite: NEGATIVE
PH: 5.5 (ref 5.0–8.0)
Protein, ur: NEGATIVE mg/dL
Specific Gravity, Urine: 1.019 (ref 1.005–1.030)

## 2015-12-24 LAB — CBC
HEMATOCRIT: 42.3 % (ref 39.0–52.0)
Hemoglobin: 14.2 g/dL (ref 13.0–17.0)
MCH: 27.7 pg (ref 26.0–34.0)
MCHC: 33.6 g/dL (ref 30.0–36.0)
MCV: 82.6 fL (ref 78.0–100.0)
Platelets: 129 10*3/uL — ABNORMAL LOW (ref 150–400)
RBC: 5.12 MIL/uL (ref 4.22–5.81)
RDW: 15.9 % — AB (ref 11.5–15.5)
WBC: 6.6 10*3/uL (ref 4.0–10.5)

## 2015-12-24 LAB — COMPREHENSIVE METABOLIC PANEL
ALT: 19 U/L (ref 17–63)
AST: 18 U/L (ref 15–41)
Albumin: 3.8 g/dL (ref 3.5–5.0)
Alkaline Phosphatase: 87 U/L (ref 38–126)
Anion gap: 11 (ref 5–15)
BUN: 21 mg/dL — ABNORMAL HIGH (ref 6–20)
CO2: 26 mmol/L (ref 22–32)
Calcium: 9.4 mg/dL (ref 8.9–10.3)
Chloride: 98 mmol/L — ABNORMAL LOW (ref 101–111)
Creatinine, Ser: 1.16 mg/dL (ref 0.61–1.24)
GFR calc Af Amer: >60 mL/min (ref >60)
GFR calc non Af Amer: >60 mL/min (ref >60)
Glucose, Bld: 160 mg/dL — ABNORMAL HIGH (ref 65–99)
Potassium: 3.9 mmol/L (ref 3.5–5.1)
Sodium: 135 mmol/L (ref 135–145)
Total Bilirubin: 1.1 mg/dL (ref 0.3–1.2)
Total Protein: 6.7 g/dL (ref 6.5–8.1)

## 2015-12-24 LAB — I-STAT TROPONIN, ED: Troponin i, poc: 0 ng/mL (ref 0.00–0.08)

## 2015-12-24 LAB — PROTIME-INR
INR: 2.15
Prothrombin Time: 24.3 seconds — ABNORMAL HIGH (ref 11.4–15.2)

## 2015-12-24 LAB — APTT: APTT: 37 s — AB (ref 24–36)

## 2015-12-24 LAB — MAGNESIUM: Magnesium: 1.8 mg/dL (ref 1.7–2.4)

## 2015-12-24 MED ORDER — ZOLPIDEM TARTRATE 5 MG PO TABS
10.0000 mg | ORAL_TABLET | Freq: Once | ORAL | Status: AC
  Administered 2015-12-24: 10 mg via ORAL
  Filled 2015-12-24: qty 2

## 2015-12-24 MED ORDER — ZOLPIDEM TARTRATE 10 MG PO TABS: 10.0000 mg | ORAL_TABLET | Freq: Every evening | ORAL | 0 refills | Status: DC | PRN

## 2015-12-24 MED ORDER — MAGNESIUM OXIDE 400 (241.3 MG) MG PO TABS
400.0000 mg | ORAL_TABLET | Freq: Once | ORAL | Status: AC
  Administered 2015-12-24: 400 mg via ORAL
  Filled 2015-12-24: qty 1

## 2015-12-24 MED ORDER — POTASSIUM CHLORIDE CRYS ER 20 MEQ PO TBCR
20.0000 meq | EXTENDED_RELEASE_TABLET | Freq: Once | ORAL | Status: AC
  Administered 2015-12-24: 20 meq via ORAL
  Filled 2015-12-24: qty 1

## 2015-12-24 NOTE — ED Notes (Signed)
Patient left at this time with all belongings. 

## 2015-12-24 NOTE — ED Triage Notes (Addendum)
Pt. reports left hand tremors at 8 am yesterday morning with left foot and left shoulder twitchings this evening  , pt. stated he consulted [employer] and decided to go to ER , pt. added mild left facial numbness and mild unsteady gait this evening . Alert and oriented , speech clear with no facial asymmetry , equal strong grips with no arm drift / ambulatory.

## 2015-12-24 NOTE — ED Provider Notes (Signed)
TIME SEEN: 4:40 AM  CHIEF COMPLAINT: Involuntary twitching  HPI: Pt is a 70 y.o. male with history of hypertension, diabetes, hyperlipidemia, ischemic cardiomyopathy, CAD status post stents, history of cardiac arrest status post pacemaker/defibrillator, atrial fibrillation on Xarelto who presents emergency department complaints that he has been unable to sleep over the past couple of days. Reports he normally takes Ambien and is currently out of this medication. States he has noticed twitching of his bilateral upper and lower extremities intermittently especially when he is trying to sleep. They're very intermittent, inconsistent. He denies any headache, head injury. Reports he feels like the left side of his cheek is slightly numb compared to the right but no other focal numbness or tingling. No weakness. Reports history of difficulty with equilibrium and ambulation at baseline that is unchanged. No current vision or hearing changes.  No chest pain, shortness of breath, palpitations. He has not felt his defibrillator fire. He denies any recent infectious symptoms including fevers, cough, vomiting, diarrhea. No bloody stools or melena. Denies any history of any neurologic disorder. He has never had involuntary movements like this before.  ROS: See HPI Constitutional: no fever  Eyes: no drainage  ENT: no runny nose   Cardiovascular:  no chest pain  Resp: no SOB  GI: no vomiting GU: no dysuria Integumentary: no rash  Allergy: no hives  Musculoskeletal: no leg swelling  Neurological: no slurred speech ROS otherwise negative  PAST MEDICAL HISTORY/PAST SURGICAL HISTORY:  Past Medical History:  Diagnosis Date  . CALLUS, RIGHT FOOT 12/08/2009  . Cardiac arrest ([place])   . Chronic systolic heart failure ([place]) 11/03/2009  . DIABETES MELLITUS, TYPE II 12/05/2006  . 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 HYPERLIPIDEMIA 11/25/2006  . HYPOGONADISM, MALE 12/05/2006  . Implantable cardiac defibrillator  MDT 09/22/2008   Change out feb 2011, 2015  . Ischemic cardiomyopathy 09/22/2008   DES CX and RCA  70% residual LAD  Done in [place]; EF 30%;; myoviewq 05/27/2006 no ischemia  . Kidney stones   . Long term (current) use of anticoagulants 04/25/2010  . Myocardial infarction May 27, 2004   "I died and they brought me back"  . 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 NEPHROLITHIASIS, HX OF 02/25/2008  . Persistent atrial fibrillation ([place]) 11/25/2006   Inapp shock 27-May-2010 AF VR >220 recurrent    MEDICATIONS:  Prior to Admission medications   Medication Sig Start Date End Date Taking? Authorizing Provider  Blood Glucose Monitoring Suppl (ONE TOUCH ULTRA SYSTEM KIT) W/DEVICE KIT 1 kit by Does not apply route once. 02/07/14  Yes Biagio Borg, MD  carvedilol (COREG) 12.5 MG tablet Take 1 tablet (12.5 mg total) by mouth 2 (two) times daily with a meal. Patient taking differently: Take 6.25 mg by mouth 4 (four) times daily.  02/09/15  Yes Amber Sena Slate, NP  fluticasone (FLONASE) 50 MCG/ACT nasal spray Instill 2 sprays into both nostrils daily. Patient taking differently: Instill 2 sprays into both nostrils daily as needed for allergies 12/23/14  Yes Biagio Borg, MD  furosemide (LASIX) 20 MG tablet TAKE 1 TABLET BY MOUTH EVERY DAY 03/06/15  Yes Deboraha Sprang, MD  glipiZIDE (GLUCOTROL XL) 10 MG 24 hr tablet TAKE 1 TABLET BY MOUTH EVERY DAY WITH BREAKFAST 11/16/15  Yes Biagio Borg, MD  glucose blood (ONE TOUCH TEST STRIPS) test strip Use as instructed up to four times per day   250.02 02/07/14  Yes Biagio Borg, MD  Lancets MISC Use  as directed up to 4 times per day 02/07/14  Yes Biagio Borg, MD  loratadine (CLARITIN) 10 MG tablet Take 10 mg by mouth daily.   Yes Historical Provider, MD  Magnesium Oxide (MAG-OX 400 PO) Take 1 tablet by mouth 2 (two) times daily.   Yes Historical Provider, MD  Multiple Vitamins-Minerals (MULTIVITAMIN PO) Take 1 tablet by mouth daily.   Yes Historical Provider, MD  nitroGLYCERIN  (NITROSTAT) 0.4 MG SL tablet Place 1 tablet (0.4 mg total) under the tongue every 5 (five) minutes as needed for chest pain. 05/22/15  Yes Almyra Deforest, PA  ranolazine (RANEXA) 500 MG 12 hr tablet Take 1 tablet (500 mg total) by mouth 2 (two) times daily. 09/08/15  Yes Deboraha Sprang, MD  sucralfate (CARAFATE) 1 g tablet Take 1 g by mouth 4 (four) times daily -  with meals and at bedtime.   Yes Historical Provider, MD  TIKOSYN 500 MCG capsule TAKE 1 CAPSULE BY MOUTH 2 TIMES DAILY 02/10/15  Yes Deboraha Sprang, MD  XARELTO 20 MG TABS tablet TAKE 1 TABLET BY MOUTH EVERY DAY 08/25/15  Yes Deboraha Sprang, MD  zolpidem (AMBIEN) 5 MG tablet TAKE 1 to 2 TABLETS BY MOUTH AT BEDTIME AS NEEDED Patient taking differently: TAKE 2 tablets every night at bedtime 12/22/15  Yes Biagio Borg, MD    ALLERGIES:  Allergies  Allergen Reactions  . Salmon [Fish Allergy] Other (See Comments)    Only canned/ not fresh (probably preservatives).     SOCIAL HISTORY:  Social History  Substance Use Topics  . Smoking status: Never Smoker  . Smokeless tobacco: Never Used  . Alcohol use 0.0 oz/week     Comment: summer     FAMILY HISTORY: Family History  Problem Relation Age of Onset  . Diabetes Maternal Grandmother   . Hyperlipidemia Maternal Grandmother   . Breast cancer Mother   . Cancer Neg Hx   . COPD Neg Hx   . Heart disease Neg Hx     EXAM: BP 113/84 (BP Location: Right Arm)   Pulse 64   Temp 97.9 F (36.6 C) (Oral)   Resp 18   SpO2 100%  CONSTITUTIONAL: Alert and oriented and responds appropriately to questions. Well-appearing; well-nourished HEAD: Normocephalic EYES: Conjunctivae clear, PERRL, extraocular movements intact ENT: normal nose; no rhinorrhea; moist mucous membranes NECK: Supple, no meningismus, no LAD  CARD: RRR; S1 and S2 appreciated; no murmurs, no clicks, no rubs, no gallops RESP: Normal chest excursion without splinting or tachypnea; breath sounds clear and equal bilaterally; no  wheezes, no rhonchi, no rales, no hypoxia or respiratory distress, speaking full sentences ABD/GI: Normal bowel sounds; non-distended; soft, non-tender, no rebound, no guarding, no peritoneal signs BACK:  The back appears normal and is non-tender to palpation, there is no CVA tenderness EXT: Normal ROM in all joints; non-tender to palpation; no edema; normal capillary refill; no cyanosis, no calf tenderness or swelling    SKIN: Normal color for age and race; warm; no rash NEURO: Moves all extremities equally, sensation to light touch intact diffusely, cranial nerves II through XII intact, strength 5/5 in all 4 extremities, no dysmetria to finger to nose testing bilaterally, normal heel-to-shin testing bilaterally, normal gait PSYCH: The patient's mood and manner are appropriate. Grooming and personal hygiene are appropriate.  MEDICAL DECISION MAKING: Patient here with complaints of involuntary twitching. I have witnessed a couple of these episodes and they seem to be bilateral in both upper and lower  extremities and just a mild, very brief involuntary movement/twitch of one muscle group at a time. No seizure-like activity. It is not a tremor. He is neurologically intact. Labs ordered from triage are unremarkable. Urine shows no sign of infection. Head CT ordered in triage also shows no acute abnormality. Potassium is 3.9 and magnesium 1.8. We'll give oral medications to optimize these electrolytes. I do not think he is having a stroke. I do not think this is an intracranial hemorrhage.  I do not think this is any other acute neurology emergency such as MS, guillian barre.  Doubt Parkinsons. I suspect is related to him not sleeping over the past couple of days. He has requested several times a refill of his Ambien. He is requested that we give him a dose of Ambien here so that he can go home and sleep. Have also interrogated his pacemaker and there have been no acute events. No arrhythmias. He has close  outpatient follow-up. His wife and daughter at bedside and they all agree with plan for discharge home.  At this time I do not feel he needs further emergent workup. Discussed return precautions.  Has been able to ambulate in the ED without assistance with normal gait.  At this time, I do not feel there is any life-threatening condition present. I have reviewed and discussed all results (EKG, imaging, lab, urine as appropriate), exam findings with patient/family. I have reviewed nursing notes and appropriate previous records.  I feel the patient is safe to be discharged home without further emergent workup and can continue workup as an outpatient as needed. Discussed usual and customary return precautions. Patient/family verbalize understanding and are comfortable with this plan.  Outpatient follow-up has been provided. All questions have been answered.      EKG Interpretation  Date/Time:  Thursday December 24 2015 00:51:11 EDT Ventricular Rate:  69 PR Interval:  214 QRS Duration: 174 QT Interval:  500 QTC Calculation: 535 R Axis:   -13 Text Interpretation:  Sinus rhythm with 1st degree A-V block Left bundle branch block Abnormal ECG No significant change since last tracing Confirmed by Ruston Fedora,  DO, Lawan Nanez [phone]) on 12/24/2015 1:03:28 AM          [place], DO 12/24/15 [id number]

## 2015-12-24 NOTE — ED Notes (Signed)
MD at bedside. 

## 2015-12-24 NOTE — Telephone Encounter (Signed)
Ok this time, thanks

## 2015-12-24 NOTE — ED Notes (Signed)
Ambulated pt in hall pt ambulated well no complaints noted at this time

## 2015-12-24 NOTE — Telephone Encounter (Signed)
Pharmacy called and pt wants to know if they can go ahead and fill his prescription from zolpidem (AMBIEN) 5 MG tablet. Please advise thanks.

## 2015-12-24 NOTE — ED Notes (Signed)
Dr. Christy Gentles notified on pt.'s symptoms and evaluated pt. at triage .

## 2015-12-28 NOTE — Telephone Encounter (Signed)
[place] [facility] given verbal authorization to early fill (12/28/2015) Ambien 5mg .

## 2016-01-12 DIAGNOSIS — I252 Old myocardial infarction: Secondary | ICD-10-CM | POA: Diagnosis not present

## 2016-01-12 DIAGNOSIS — R002 Palpitations: Secondary | ICD-10-CM | POA: Diagnosis not present

## 2016-01-12 DIAGNOSIS — I447 Left bundle-branch block, unspecified: Secondary | ICD-10-CM | POA: Diagnosis not present

## 2016-01-12 DIAGNOSIS — I110 Hypertensive heart disease with heart failure: Secondary | ICD-10-CM | POA: Diagnosis not present

## 2016-01-12 DIAGNOSIS — I4891 Unspecified atrial fibrillation: Secondary | ICD-10-CM | POA: Diagnosis not present

## 2016-01-12 DIAGNOSIS — E119 Type 2 diabetes mellitus without complications: Secondary | ICD-10-CM | POA: Diagnosis not present

## 2016-01-12 DIAGNOSIS — Z6824 Body mass index (BMI) 24.0-24.9, adult: Secondary | ICD-10-CM | POA: Diagnosis not present

## 2016-01-12 DIAGNOSIS — I2510 Atherosclerotic heart disease of native coronary artery without angina pectoris: Secondary | ICD-10-CM | POA: Diagnosis not present

## 2016-01-12 DIAGNOSIS — I493 Ventricular premature depolarization: Secondary | ICD-10-CM | POA: Diagnosis not present

## 2016-01-12 DIAGNOSIS — I255 Ischemic cardiomyopathy: Secondary | ICD-10-CM | POA: Diagnosis not present

## 2016-01-12 DIAGNOSIS — I4901 Ventricular fibrillation: Secondary | ICD-10-CM | POA: Diagnosis not present

## 2016-01-14 ENCOUNTER — Other Ambulatory Visit: Payer: Self-pay | Admitting: Internal Medicine

## 2016-01-29 DIAGNOSIS — Z4509 Encounter for adjustment and management of other cardiac device: Secondary | ICD-10-CM | POA: Diagnosis not present

## 2016-01-29 DIAGNOSIS — I493 Ventricular premature depolarization: Secondary | ICD-10-CM | POA: Diagnosis not present

## 2016-02-03 DIAGNOSIS — I493 Ventricular premature depolarization: Secondary | ICD-10-CM | POA: Diagnosis not present

## 2016-02-08 ENCOUNTER — Other Ambulatory Visit: Payer: Self-pay | Admitting: Internal Medicine

## 2016-02-16 DIAGNOSIS — I493 Ventricular premature depolarization: Secondary | ICD-10-CM | POA: Diagnosis not present

## 2016-03-01 ENCOUNTER — Other Ambulatory Visit: Payer: Medicare Other

## 2016-03-03 ENCOUNTER — Other Ambulatory Visit: Payer: Self-pay | Admitting: Internal Medicine

## 2016-03-03 DIAGNOSIS — I10 Essential (primary) hypertension: Secondary | ICD-10-CM

## 2016-03-07 ENCOUNTER — Other Ambulatory Visit (INDEPENDENT_AMBULATORY_CARE_PROVIDER_SITE_OTHER): Payer: Medicare Other

## 2016-03-07 DIAGNOSIS — I10 Essential (primary) hypertension: Secondary | ICD-10-CM

## 2016-03-07 LAB — BASIC METABOLIC PANEL
BUN: 17 mg/dL (ref 6–23)
CALCIUM: 9.4 mg/dL (ref 8.4–10.5)
CO2: 29 mEq/L (ref 19–32)
CREATININE: 1.14 mg/dL (ref 0.40–1.50)
Chloride: 96 mEq/L (ref 96–112)
GFR: 67.82 mL/min (ref >60.00)
GLUCOSE: 288 mg/dL — AB (ref 70–99)
POTASSIUM: 4.5 meq/L (ref 3.5–5.1)
Sodium: 131 mEq/L — ABNORMAL LOW (ref 135–145)

## 2016-03-08 ENCOUNTER — Other Ambulatory Visit: Payer: Self-pay | Admitting: Nurse Practitioner

## 2016-03-08 ENCOUNTER — Other Ambulatory Visit: Payer: Self-pay | Admitting: Internal Medicine

## 2016-03-10 NOTE — Telephone Encounter (Signed)
Okay to refill? Snapshot has two different sigs listed. If okay please advise on what current therapy should be. Thanks, MI

## 2016-03-10 NOTE — Telephone Encounter (Signed)
Spoke with patient and he confirmed that he is taking furosemide 20 mg qd.

## 2016-03-10 NOTE — Telephone Encounter (Signed)
Okay to refill? 

## 2016-03-10 NOTE — Telephone Encounter (Signed)
Please call to ask him how he is taking this. He has seen Dr. Willis Modena at [facility] recently for his rhythm.

## 2016-03-10 NOTE — Telephone Encounter (Signed)
Spoke with patient and he stated that he started taking 6.25 mg qid as he was having issues with equilibrium on the 12.5 mg bid dose. He would like to have an rx for a 6.25 mg tab. Please advise. Thanks, MI

## 2016-03-10 NOTE — Telephone Encounter (Signed)
Please call to confirm how he is taking this- he is seeing Dr. Willis Modena at [facility] also, just need to make sure they have not changed his dose.

## 2016-03-11 ENCOUNTER — Other Ambulatory Visit: Payer: Self-pay

## 2016-03-11 ENCOUNTER — Telehealth: Payer: Self-pay | Admitting: *Deleted

## 2016-03-15 ENCOUNTER — Other Ambulatory Visit: Payer: Self-pay

## 2016-03-18 ENCOUNTER — Other Ambulatory Visit: Payer: Self-pay | Admitting: *Deleted

## 2016-03-18 MED ORDER — CARVEDILOL 12.5 MG PO TABS: 12.5000 mg | ORAL_TABLET | Freq: Two times a day (BID) | ORAL | 5 refills | Status: DC

## 2016-03-21 NOTE — Telephone Encounter (Signed)
Taken care of

## 2016-03-22 NOTE — Telephone Encounter (Signed)
Reviewed with Dr. Caryl Comes, ok to decrease coreg to 6.25 mg BID. Can you please call and notify the patient of this.

## 2016-03-25 ENCOUNTER — Telehealth: Payer: Self-pay | Admitting: Emergency Medicine

## 2016-03-25 ENCOUNTER — Other Ambulatory Visit: Payer: Self-pay | Admitting: Internal Medicine

## 2016-03-25 NOTE — Telephone Encounter (Signed)
Done hardcopy to Corinne  

## 2016-03-25 NOTE — Telephone Encounter (Signed)
faxed

## 2016-03-25 NOTE — Telephone Encounter (Signed)
[facility] called and wants to know if they can get lab results faxed over to them. They sent a fax over about 3 wks ago. The fax number is [phone]. Please advise thanks.

## 2016-04-12 DIAGNOSIS — Z6827 Body mass index (BMI) 27.0-27.9, adult: Secondary | ICD-10-CM | POA: Diagnosis not present

## 2016-04-12 DIAGNOSIS — I110 Hypertensive heart disease with heart failure: Secondary | ICD-10-CM | POA: Diagnosis not present

## 2016-04-12 DIAGNOSIS — I499 Cardiac arrhythmia, unspecified: Secondary | ICD-10-CM | POA: Diagnosis not present

## 2016-04-12 DIAGNOSIS — I5022 Chronic systolic (congestive) heart failure: Secondary | ICD-10-CM | POA: Diagnosis not present

## 2016-04-12 DIAGNOSIS — I2510 Atherosclerotic heart disease of native coronary artery without angina pectoris: Secondary | ICD-10-CM | POA: Diagnosis not present

## 2016-04-12 DIAGNOSIS — I255 Ischemic cardiomyopathy: Secondary | ICD-10-CM | POA: Diagnosis not present

## 2016-04-12 DIAGNOSIS — I447 Left bundle-branch block, unspecified: Secondary | ICD-10-CM | POA: Diagnosis not present

## 2016-04-12 DIAGNOSIS — Z955 Presence of coronary angioplasty implant and graft: Secondary | ICD-10-CM | POA: Diagnosis not present

## 2016-04-12 DIAGNOSIS — E119 Type 2 diabetes mellitus without complications: Secondary | ICD-10-CM | POA: Diagnosis not present

## 2016-04-12 DIAGNOSIS — I4891 Unspecified atrial fibrillation: Secondary | ICD-10-CM | POA: Diagnosis not present

## 2016-04-12 DIAGNOSIS — I083 Combined rheumatic disorders of mitral, aortic and tricuspid valves: Secondary | ICD-10-CM | POA: Diagnosis not present

## 2016-04-12 DIAGNOSIS — I252 Old myocardial infarction: Secondary | ICD-10-CM | POA: Diagnosis not present

## 2016-04-21 DIAGNOSIS — Z95810 Presence of automatic (implantable) cardiac defibrillator: Secondary | ICD-10-CM | POA: Diagnosis not present

## 2016-04-21 DIAGNOSIS — E119 Type 2 diabetes mellitus without complications: Secondary | ICD-10-CM | POA: Diagnosis not present

## 2016-04-21 DIAGNOSIS — I5022 Chronic systolic (congestive) heart failure: Secondary | ICD-10-CM | POA: Diagnosis not present

## 2016-04-21 DIAGNOSIS — I2510 Atherosclerotic heart disease of native coronary artery without angina pectoris: Secondary | ICD-10-CM | POA: Diagnosis not present

## 2016-04-21 DIAGNOSIS — I255 Ischemic cardiomyopathy: Secondary | ICD-10-CM | POA: Diagnosis not present

## 2016-04-21 DIAGNOSIS — I252 Old myocardial infarction: Secondary | ICD-10-CM | POA: Diagnosis not present

## 2016-04-21 DIAGNOSIS — I4901 Ventricular fibrillation: Secondary | ICD-10-CM | POA: Diagnosis not present

## 2016-04-21 DIAGNOSIS — I509 Heart failure, unspecified: Secondary | ICD-10-CM | POA: Diagnosis not present

## 2016-04-21 DIAGNOSIS — I481 Persistent atrial fibrillation: Secondary | ICD-10-CM | POA: Diagnosis not present

## 2016-04-21 DIAGNOSIS — I110 Hypertensive heart disease with heart failure: Secondary | ICD-10-CM | POA: Diagnosis not present

## 2016-04-21 DIAGNOSIS — I493 Ventricular premature depolarization: Secondary | ICD-10-CM | POA: Diagnosis not present

## 2016-04-21 DIAGNOSIS — I447 Left bundle-branch block, unspecified: Secondary | ICD-10-CM | POA: Diagnosis not present

## 2016-04-21 DIAGNOSIS — Z4502 Encounter for adjustment and management of automatic implantable cardiac defibrillator: Secondary | ICD-10-CM | POA: Diagnosis not present

## 2016-05-04 DIAGNOSIS — I447 Left bundle-branch block, unspecified: Secondary | ICD-10-CM | POA: Diagnosis not present

## 2016-05-04 DIAGNOSIS — Z45018 Encounter for adjustment and management of other part of cardiac pacemaker: Secondary | ICD-10-CM | POA: Diagnosis not present

## 2016-05-04 DIAGNOSIS — T148XXA Other injury of unspecified body region, initial encounter: Secondary | ICD-10-CM | POA: Diagnosis not present

## 2016-05-04 DIAGNOSIS — I255 Ischemic cardiomyopathy: Secondary | ICD-10-CM | POA: Diagnosis not present

## 2016-05-04 DIAGNOSIS — Z4502 Encounter for adjustment and management of automatic implantable cardiac defibrillator: Secondary | ICD-10-CM | POA: Diagnosis not present

## 2016-05-04 DIAGNOSIS — I440 Atrioventricular block, first degree: Secondary | ICD-10-CM | POA: Diagnosis not present

## 2016-05-12 ENCOUNTER — Encounter: Payer: Self-pay | Admitting: Cardiology

## 2016-06-09 ENCOUNTER — Other Ambulatory Visit: Payer: Self-pay | Admitting: Internal Medicine

## 2016-08-04 DIAGNOSIS — Z95810 Presence of automatic (implantable) cardiac defibrillator: Secondary | ICD-10-CM | POA: Diagnosis not present

## 2016-08-04 DIAGNOSIS — Z4502 Encounter for adjustment and management of automatic implantable cardiac defibrillator: Secondary | ICD-10-CM | POA: Diagnosis not present

## 2016-08-04 DIAGNOSIS — Z4509 Encounter for adjustment and management of other cardiac device: Secondary | ICD-10-CM | POA: Diagnosis not present

## 2016-08-08 ENCOUNTER — Other Ambulatory Visit: Payer: Self-pay | Admitting: Internal Medicine

## 2016-09-19 ENCOUNTER — Other Ambulatory Visit: Payer: Self-pay | Admitting: Internal Medicine

## 2016-09-20 NOTE — Telephone Encounter (Signed)
Done hardcopy to [place]  

## 2016-09-21 ENCOUNTER — Other Ambulatory Visit: Payer: Self-pay | Admitting: Internal Medicine

## 2016-09-21 NOTE — Telephone Encounter (Signed)
Faxed

## 2016-09-21 NOTE — Telephone Encounter (Signed)
Done hardcopy to [place]  

## 2016-10-04 DIAGNOSIS — N644 Mastodynia: Secondary | ICD-10-CM | POA: Diagnosis not present

## 2016-10-04 DIAGNOSIS — I4901 Ventricular fibrillation: Secondary | ICD-10-CM | POA: Diagnosis not present

## 2016-10-04 DIAGNOSIS — I4891 Unspecified atrial fibrillation: Secondary | ICD-10-CM | POA: Diagnosis not present

## 2016-10-04 DIAGNOSIS — I255 Ischemic cardiomyopathy: Secondary | ICD-10-CM | POA: Diagnosis not present

## 2016-10-04 DIAGNOSIS — I509 Heart failure, unspecified: Secondary | ICD-10-CM | POA: Diagnosis not present

## 2016-10-04 DIAGNOSIS — Z6827 Body mass index (BMI) 27.0-27.9, adult: Secondary | ICD-10-CM | POA: Diagnosis not present

## 2016-10-04 DIAGNOSIS — I252 Old myocardial infarction: Secondary | ICD-10-CM | POA: Diagnosis not present

## 2016-10-04 DIAGNOSIS — E119 Type 2 diabetes mellitus without complications: Secondary | ICD-10-CM | POA: Diagnosis not present

## 2016-10-04 DIAGNOSIS — I5022 Chronic systolic (congestive) heart failure: Secondary | ICD-10-CM | POA: Diagnosis not present

## 2016-10-04 DIAGNOSIS — I110 Hypertensive heart disease with heart failure: Secondary | ICD-10-CM | POA: Diagnosis not present

## 2016-10-04 DIAGNOSIS — I493 Ventricular premature depolarization: Secondary | ICD-10-CM | POA: Diagnosis not present

## 2016-10-04 DIAGNOSIS — I2510 Atherosclerotic heart disease of native coronary artery without angina pectoris: Secondary | ICD-10-CM | POA: Diagnosis not present

## 2016-10-06 ENCOUNTER — Other Ambulatory Visit: Payer: Self-pay | Admitting: Physician Assistant

## 2016-10-06 NOTE — Telephone Encounter (Signed)
Refill Request.  

## 2016-10-10 ENCOUNTER — Other Ambulatory Visit: Payer: Self-pay | Admitting: Internal Medicine

## 2016-10-11 ENCOUNTER — Encounter: Payer: Self-pay | Admitting: Internal Medicine

## 2016-10-11 ENCOUNTER — Other Ambulatory Visit: Payer: Medicare Other

## 2016-10-11 ENCOUNTER — Ambulatory Visit (INDEPENDENT_AMBULATORY_CARE_PROVIDER_SITE_OTHER): Payer: Medicare Other | Admitting: Internal Medicine

## 2016-10-11 VITALS — BP 112/72 | HR 73 | Temp 97.9°F | Ht 70.0 in | Wt 188.0 lb

## 2016-10-11 DIAGNOSIS — Z0001 Encounter for general adult medical examination with abnormal findings: Secondary | ICD-10-CM | POA: Diagnosis not present

## 2016-10-11 DIAGNOSIS — L84 Corns and callosities: Secondary | ICD-10-CM

## 2016-10-11 DIAGNOSIS — Z1159 Encounter for screening for other viral diseases: Secondary | ICD-10-CM

## 2016-10-11 DIAGNOSIS — M546 Pain in thoracic spine: Secondary | ICD-10-CM | POA: Diagnosis not present

## 2016-10-11 DIAGNOSIS — E785 Hyperlipidemia, unspecified: Secondary | ICD-10-CM

## 2016-10-11 DIAGNOSIS — E0842 Diabetes mellitus due to underlying condition with diabetic polyneuropathy: Secondary | ICD-10-CM | POA: Diagnosis not present

## 2016-10-11 DIAGNOSIS — Z114 Encounter for screening for human immunodeficiency virus [HIV]: Secondary | ICD-10-CM

## 2016-10-11 DIAGNOSIS — M549 Dorsalgia, unspecified: Secondary | ICD-10-CM | POA: Insufficient documentation

## 2016-10-11 MED ORDER — TIZANIDINE HCL 4 MG PO TABS: 4.0000 mg | ORAL_TABLET | Freq: Four times a day (QID) | ORAL | 0 refills | Status: DC | PRN

## 2016-10-11 NOTE — Patient Instructions (Addendum)
Please take all new medication as prescribed - the muscle relaxer  Please continue all other medications as before, and refills have been done if requested.  Please have the pharmacy call with any other refills you may need.  Please continue your efforts at being more active, low cholesterol diet, and weight control.  You are otherwise up to date with prevention measures today.  Please keep your appointments with your specialists as you may have planned  You will be contacted regarding the referral for: Podiatry, Endocrinology, and Opthalmology and colonoscopy  Please go to the LAB in the Basement (turn left off the elevator) for the tests to be done today  You will be contacted by phone if any changes need to be made immediately.  Otherwise, you will receive a letter about your results with an explanation, but please check with MyChart first.  Please remember to sign up for MyChart if you have not done so, as this will be important to you in the future with finding out test results, communicating by private email, and scheduling acute appointments online when needed.  If you have Medicare related insurance (such as traditional Medicare, [employer] or [employer], or similar), Please make an appointment at the [facility] with Sharee Pimple, the [employer], for your Wellness Visit in this office, which is a benefit with your insurance.  Please return in 1 year for your yearly visit, or sooner if needed

## 2016-10-11 NOTE — Progress Notes (Signed)
Subjective:    Patient ID: [name], male    DOB: 02-24-1946, 71 y.o.   MRN: [id number]  HPI  Here for wellness and f/u;  Overall doing ok;  Pt denies Chest pain, worsening SOB, DOE, wheezing, orthopnea, PND, worsening LE edema, palpitations, dizziness or syncope.  Pt denies neurological change such as new headache, facial or extremity weakness.  Pt denies polydipsia, polyuria, or low sugar symptoms. Pt states overall good compliance with treatment and medications, good tolerability, and has been trying to follow appropriate diet.  Pt denies worsening depressive symptoms, suicidal ideation or panic. No fever, night sweats, wt loss, loss of appetite, or other constitutional symptoms.  Pt states good ability with ADL's, has low fall risk, home safety reviewed and adequate, no other significant changes in hearing or vision, and only occasionally active with exercise. 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 His girlfriend significant other (platonic) died after 5 yrs of knowing her, and has had some low mood.   Pt continues to have recurring LBP without change in severity, bowel or bladder change, fever, wt loss,  worsening LE pain/numbness/weakness, gait change or falls. S/p recent ablation and PPM per [facility] cardiology Also with right periscapular "shoulder" pain, not better with head pad except for a short time; Ongpoing mild to mod, intermittent sharp worse to more the right shoulder; started after started doing water exercise using the arms for exercise.   Also with diffuse bilat feet pain and several arthritic points per pt Past Medical History:  Diagnosis Date  . CALLUS, RIGHT FOOT 12/08/2009  . Cardiac arrest ([place])   . Chronic systolic heart failure ([place]) 11/03/2009  . DIABETES MELLITUS, TYPE II 12/05/2006  . 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 HYPERLIPIDEMIA 11/25/2006  . HYPOGONADISM, MALE 12/05/2006  . Implantable cardiac defibrillator MDT 09/22/2008   Change out feb 2011, 2015  . Ischemic cardiomyopathy 09/22/2008   DES CX and RCA  70% residual LAD  Done in [place]; EF 30%;; myoviewq 2006-05-11 no ischemia  . Kidney stones   . Long term (current) use of anticoagulants 04/25/2010  . Myocardial infarction [facility]) May 11, 2004   "I died and they brought me back"  . 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 NEPHROLITHIASIS, HX OF 02/25/2008  . Persistent atrial fibrillation ([place]) 11/25/2006   Inapp shock May 11, 2010 AF VR >220 recurrent   Past Surgical History:  Procedure Laterality Date  . CARDIAC CATHETERIZATION     feburary 05/11/12  . CARDIAC DEFIBRILLATOR PLACEMENT     Guidant Vitality [name]  . CARDIOVERSION N/A 04/25/2012   Procedure: CARDIOVERSION;  Surgeon: Darlin Coco, MD;  Location: [facility] ENDOSCOPY;  Service: Cardiovascular;  Laterality: N/A;  . CARDIOVERSION N/A 05/21/2012   Procedure: CARDIOVERSION;  Surgeon: Deboraha Sprang, MD;  Location: [place];  Service: Cardiovascular;  Laterality: N/A;  . CATARACT EXTRACTION    . EYE SURGERY     cataracts  . IMPLANTABLE CARDIOVERTER DEFIBRILLATOR (ICD) GENERATOR CHANGE N/A 02/05/2014   Procedure: ICD GENERATOR CHANGE;  Surgeon: Deboraha Sprang, MD;  Location: [facility] CATH LAB;  Service: Cardiovascular;  Laterality: N/A;  . PTCA    . TONSILLECTOMY    . TONSILLECTOMY      reports that he has never smoked. He has never used smokeless tobacco. He reports that he drinks alcohol. He reports that he does not use drugs. family history includes Breast cancer in his mother; Diabetes in his maternal grandmother; Hyperlipidemia in his maternal  grandmother. Allergies  Allergen Reactions  . Salmon [Fish Allergy] Other (See Comments)    Only canned/ not fresh (probably preservatives).    Current Outpatient Prescriptions on File Prior to Visit  Medication Sig Dispense Refill  . Blood Glucose Monitoring Suppl (ONE TOUCH ULTRA SYSTEM KIT) W/DEVICE KIT 1 kit by Does not apply route once. 1 each 0  . carvedilol (COREG)  12.5 MG tablet Take 1 tablet (12.5 mg total) by mouth 2 (two) times daily with a meal. 180 tablet 5  . dofetilide (TIKOSYN) 500 MCG capsule TAKE 1 CAPSULE BY MOUTH 2 TIMES DAILY 60 capsule 7  . fluticasone (FLONASE) 50 MCG/ACT nasal spray Instill 2 sprays into both nostrils daily. (Patient taking differently: Instill 2 sprays into both nostrils daily as needed for allergies) 16 g 5  . furosemide (LASIX) 20 MG tablet Take 1 tablet (20 mg total) by mouth daily. OVERDUE FOR FOLLOW UP. PLEASE CALL AND SCHEDULE 15 tablet 0  . glipiZIDE (GLUCOTROL XL) 10 MG 24 hr tablet Take 1 tablet (10 mg total) by mouth daily with breakfast. **PT NEEDS APPOINTMENT FOR ADDITIONAL REFILLS** 30 tablet 0  . glucose blood (ONE TOUCH TEST STRIPS) test strip Use as instructed up to four times per day   250.02 100 each 12  . Lancets MISC Use as directed up to 4 times per day 100 each 11  . loratadine (CLARITIN) 10 MG tablet Take 10 mg by mouth daily.    . Magnesium Oxide (MAG-OX 400 PO) Take 1 tablet by mouth 2 (two) times daily.    . Multiple Vitamins-Minerals (MULTIVITAMIN PO) Take 1 tablet by mouth daily.    Marland Kitchen NITROSTAT 0.4 MG SL tablet Place 1 tablet (0.4 mg total) under the tongue every 5 (five) minutes as needed for chest pain. 25 tablet 5  . ranolazine (RANEXA) 500 MG 12 hr tablet Take 1 tablet (500 mg total) by mouth 2 (two) times daily. 60 tablet 6  . sucralfate (CARAFATE) 1 g tablet Take 1 g by mouth 4 (four) times daily -  with meals and at bedtime.    Alveda Reasons 20 MG TABS tablet TAKE 1 TABLET BY MOUTH EVERY DAY 30 tablet 0  . zolpidem (AMBIEN) 5 MG tablet TAKE 1 OR 2 TABLETS BY MOUTH AT BEDTIME AS NEEDED 180 tablet 0   No current facility-administered medications on file prior to visit.    Review of Systems Constitutional: Negative for other unusual diaphoresis, sweats, appetite or weight changes HENT: Negative for other worsening hearing loss, ear pain, facial swelling, mouth sores or neck stiffness.   Eyes:  Negative for other worsening pain, redness or other visual disturbance.  Respiratory: Negative for other stridor or swelling Cardiovascular: Negative for other palpitations or other chest pain  Gastrointestinal: Negative for worsening diarrhea or loose stools, blood in stool, distention or other pain Genitourinary: Negative for hematuria, flank pain or other change in urine volume.  Musculoskeletal: Negative for myalgias or other joint swelling.  Skin: Negative for other color change, or other wound or worsening drainage.  Neurological: Negative for other syncope or numbness. Hematological: Negative for other adenopathy or swelling Psychiatric/Behavioral: Negative for hallucinations, other worsening agitation, SI, self-injury, or new decreased concentration All other system neg per pt    Objective:   Physical Exam BP 112/72   Pulse 73   Temp 97.9 F (36.6 C)   Ht '5\' 10"'  (1.778 m)   Wt 188 lb (85.3 kg)   SpO2 98%   BMI 26.98  kg/m  VS noted,  Constitutional: Pt is oriented to person, place, and time. Appears well-developed and well-nourished, in no significant distress and comfortable Head: Normocephalic and atraumatic  Eyes: Conjunctivae and EOM are normal. Pupils are equal, round, and reactive to light Right Ear: External ear normal without discharge Left Ear: External ear normal without discharge Nose: Nose without discharge or deformity Mouth/Throat: Oropharynx is without other ulcerations and moist  Neck: Normal range of motion. Neck supple. No JVD present. No tracheal deviation present or significant neck LA or mass Cardiovascular: Normal rate, regular rhythm, normal heart sounds and intact distal pulses.   Pulmonary/Chest: WOB normal and breath sounds without rales or wheezing  Abdominal: Soft. Bowel sounds are normal. NT. No HSM  Musculoskeletal: Normal range of motion. Exhibits no edema, has mild tenderness to the right medial periscaplar area without swelling or  rash Lymphadenopathy: Has no other cervical adenopathy.  Neurological: Pt is alert and oriented to person, place, and time. Pt has normal reflexes. No cranial nerve deficit. Motor grossly intact, Gait intact Skin: Skin is warm and dry. No rash noted or new ulcerations, but callous to right heel Psychiatric:  Has normal mood and affect. Behavior is normal without agitation No other exam findings Lab Results  Component Value Date   WBC 6.6 12/24/2015   HGB 14.2 12/24/2015   HCT 42.3 12/24/2015   PLT 129 (L) 12/24/2015   GLUCOSE 288 (H) 03/07/2016   CHOL 163 02/11/2015   TRIG 190 (H) 02/11/2015   HDL 34 (L) 02/11/2015   LDLDIRECT 74.0 02/04/2014   LDLCALC 91 02/11/2015   ALT 19 12/24/2015   AST 18 12/24/2015   NA 131 (L) 03/07/2016   K 4.5 03/07/2016   CL 96 03/07/2016   CREATININE 1.14 03/07/2016   BUN 17 03/07/2016   CO2 29 03/07/2016   TSH 2.70 07/13/2015   PSA 0.70 02/04/2014   INR 2.15 12/24/2015   HGBA1C 7.9 (H) 10/22/2014   MICROALBUR 4.6 (H) 02/04/2014      Assessment & Plan:

## 2016-10-11 NOTE — Telephone Encounter (Signed)
Spoke with pt who will be following at [facility] and with Dr. Caryl Comes. He will call to make appt with Caryl Comes if insurance will allow since he just saw cards in [place]. Will send refills for 1 month until appt made.

## 2016-10-12 ENCOUNTER — Other Ambulatory Visit (INDEPENDENT_AMBULATORY_CARE_PROVIDER_SITE_OTHER): Payer: Medicare Other

## 2016-10-12 ENCOUNTER — Other Ambulatory Visit: Payer: Self-pay | Admitting: Internal Medicine

## 2016-10-12 ENCOUNTER — Telehealth: Payer: Self-pay

## 2016-10-12 DIAGNOSIS — Z1159 Encounter for screening for other viral diseases: Secondary | ICD-10-CM | POA: Diagnosis not present

## 2016-10-12 DIAGNOSIS — E0842 Diabetes mellitus due to underlying condition with diabetic polyneuropathy: Secondary | ICD-10-CM

## 2016-10-12 DIAGNOSIS — Z0001 Encounter for general adult medical examination with abnormal findings: Secondary | ICD-10-CM

## 2016-10-12 LAB — BASIC METABOLIC PANEL
BUN: 17 mg/dL (ref 6–23)
CALCIUM: 9.5 mg/dL (ref 8.4–10.5)
CO2: 27 mEq/L (ref 19–32)
CREATININE: 1.07 mg/dL (ref 0.40–1.50)
Chloride: 96 mEq/L (ref 96–112)
GFR: 72.83 mL/min (ref >60.00)
GLUCOSE: 249 mg/dL — AB (ref 70–99)
Potassium: 4.7 mEq/L (ref 3.5–5.1)
Sodium: 133 mEq/L — ABNORMAL LOW (ref 135–145)

## 2016-10-12 LAB — CBC WITH DIFFERENTIAL/PLATELET
BASOS PCT: 1 % (ref 0.0–3.0)
Basophils Absolute: 0.1 10*3/uL (ref 0.0–0.1)
EOS PCT: 4.8 % (ref 0.0–5.0)
Eosinophils Absolute: 0.4 10*3/uL (ref 0.0–0.7)
HEMATOCRIT: 44.2 % (ref 39.0–52.0)
HEMOGLOBIN: 14.9 g/dL (ref 13.0–17.0)
Lymphocytes Relative: 19.4 % (ref 12.0–46.0)
Lymphs Abs: 1.7 10*3/uL (ref 0.7–4.0)
MCHC: 33.6 g/dL (ref 30.0–36.0)
MCV: 88.3 fl (ref 78.0–100.0)
Monocytes Absolute: 1.3 10*3/uL — ABNORMAL HIGH (ref 0.1–1.0)
Monocytes Relative: 14.3 % — ABNORMAL HIGH (ref 3.0–12.0)
Neutro Abs: 5.3 10*3/uL (ref 1.4–7.7)
Neutrophils Relative %: 60.5 % (ref 43.0–77.0)
Platelets: 198 10*3/uL (ref 150.0–400.0)
RBC: 5.01 Mil/uL (ref 4.22–5.81)
RDW: 14.6 % (ref 11.5–15.5)
WBC: 8.8 10*3/uL (ref 4.0–10.5)

## 2016-10-12 LAB — PSA: PSA: 0.41 ng/mL (ref 0.10–4.00)

## 2016-10-12 LAB — HEPATIC FUNCTION PANEL
ALBUMIN: 4.2 g/dL (ref 3.5–5.2)
ALT: 20 U/L (ref 0–53)
AST: 17 U/L (ref 0–37)
Alkaline Phosphatase: 108 U/L (ref 39–117)
Bilirubin, Direct: 0.1 mg/dL (ref 0.0–0.3)
Total Bilirubin: 0.5 mg/dL (ref 0.2–1.2)
Total Protein: 6.9 g/dL (ref 6.0–8.3)

## 2016-10-12 LAB — LIPID PANEL
CHOLESTEROL: 179 mg/dL (ref 0–200)
HDL: 33.3 mg/dL — AB (ref >39.00)
NonHDL: 145.57
Total CHOL/HDL Ratio: 5
Triglycerides: 319 mg/dL — ABNORMAL HIGH (ref 0.0–149.0)
VLDL: 63.8 mg/dL — AB (ref 0.0–40.0)

## 2016-10-12 LAB — HEMOGLOBIN A1C: HEMOGLOBIN A1C: 11.1 % — AB (ref 4.6–6.5)

## 2016-10-12 LAB — LDL CHOLESTEROL, DIRECT: Direct LDL: 100 mg/dL

## 2016-10-12 LAB — URINALYSIS, ROUTINE W REFLEX MICROSCOPIC
Bilirubin Urine: NEGATIVE
HGB URINE DIPSTICK: NEGATIVE
KETONES UR: NEGATIVE
LEUKOCYTES UA: NEGATIVE
NITRITE: NEGATIVE
RBC / HPF: NONE SEEN (ref 0–?)
SPECIFIC GRAVITY, URINE: 1.025 (ref 1.000–1.030)
URINE GLUCOSE: 250 — AB
Urobilinogen, UA: 0.2 (ref 0.0–1.0)
pH: 6 (ref 5.0–8.0)

## 2016-10-12 LAB — TSH: TSH: 6.02 u[IU]/mL — ABNORMAL HIGH (ref 0.35–4.50)

## 2016-10-12 LAB — MICROALBUMIN / CREATININE URINE RATIO
Creatinine,U: 193.4 mg/dL
Microalb Creat Ratio: 2.5 mg/g (ref 0.0–30.0)
Microalb, Ur: 4.8 mg/dL — ABNORMAL HIGH (ref 0.0–1.9)

## 2016-10-12 MED ORDER — METFORMIN HCL ER 500 MG PO TB24: 1000.0000 mg | ORAL_TABLET | Freq: Every day | ORAL | 3 refills | Status: DC

## 2016-10-12 MED ORDER — PIOGLITAZONE HCL 30 MG PO TABS: 30.0000 mg | ORAL_TABLET | Freq: Every day | ORAL | 3 refills | Status: DC

## 2016-10-12 NOTE — Assessment & Plan Note (Signed)
stable overall by history and exam, recent data reviewed with pt, and pt to continue medical treatment as before,  to f/u any worsening symptoms or concerns Lab Results  Component Value Date   LDLCALC 91 02/11/2015

## 2016-10-12 NOTE — Assessment & Plan Note (Signed)

## 2016-10-12 NOTE — Telephone Encounter (Signed)
[facility]    (Do Not Take)  Please take all new medication as prescribed - the actos 30 mg - 1 per day - done erx

## 2016-10-12 NOTE — Assessment & Plan Note (Addendum)
I ssuspect msk strain, but for cxr to r/o occult pulm process  In addition to the time spent performing CPE, I spent an additional 15 minutes face to face,in which greater than 50% of this time was spent in counseling and coordination of care for patient's illness as documented, including the differential dx, and need for further evaluation, tx and management of back pain, DM, HLD and feet pain

## 2016-10-12 NOTE — Telephone Encounter (Signed)
Pt has been informed and expressed understanding but he would like to know if it is an alternative for metformin because it causes him to go to the restroom uncontrollably. Please advise.

## 2016-10-12 NOTE — Assessment & Plan Note (Signed)
?   Control, for a1c today, cont to focus on wt control, diet and med compliance, consider endo referral if not controlled

## 2016-10-12 NOTE — Telephone Encounter (Signed)
-----   Message from Biagio Borg, MD sent at 10/12/2016  1:43 PM EDT ----- Left message on MyChart, pt to cont same tx except  The test results show that your current treatment is OK, except the A1c is very high, indicating severe high blood sugars overall.  Please add a second medication called metformin ER 500 mg - 2 pill per day and continue the glipizide.    Amadeo Coke to please inform pt, I will do rx

## 2016-10-12 NOTE — Telephone Encounter (Signed)
Pt has been informed and expressed understanding.  

## 2016-10-12 NOTE — Addendum Note (Signed)
Addended by: Biagio Borg on: 10/12/2016 04:59 PM   Modules accepted: Orders

## 2016-10-12 NOTE — Assessment & Plan Note (Signed)
Also for podiatry referral given the hx of DM

## 2016-10-13 LAB — HEPATITIS C ANTIBODY: HCV AB: NONREACTIVE

## 2016-11-07 ENCOUNTER — Other Ambulatory Visit: Payer: Self-pay | Admitting: Internal Medicine

## 2016-11-08 NOTE — Telephone Encounter (Signed)
Faxed

## 2016-11-08 NOTE — Telephone Encounter (Signed)
Done hardcopy to [place]  

## 2016-11-10 ENCOUNTER — Ambulatory Visit (INDEPENDENT_AMBULATORY_CARE_PROVIDER_SITE_OTHER): Payer: Medicare Other | Admitting: Endocrinology

## 2016-11-10 ENCOUNTER — Encounter: Payer: Self-pay | Admitting: Endocrinology

## 2016-11-10 VITALS — BP 116/78 | HR 57 | Wt 190.6 lb

## 2016-11-10 DIAGNOSIS — E0842 Diabetes mellitus due to underlying condition with diabetic polyneuropathy: Secondary | ICD-10-CM

## 2016-11-10 MED ORDER — INSULIN GLARGINE 100 UNIT/ML SOLOSTAR PEN: 15.0000 [IU] | PEN_INJECTOR | SUBCUTANEOUS | 99 refills | Status: DC

## 2016-11-10 NOTE — Patient Instructions (Addendum)
good diet and exercise significantly improve the control of your diabetes.  please let me know if you wish to be referred to a dietician.  high blood sugar is very risky to your health.  you should see an eye doctor and dentist every year.  It is very important to get all recommended vaccinations.  Controlling your blood pressure and cholesterol drastically reduces the damage diabetes does to your body.  Those who smoke should quit.  Please discuss these with your doctor.  check your blood sugar twice a day.  vary the time of day when you check, between before the 3 meals, and at bedtime.  also check if you have symptoms of your blood sugar being too high or too low.  please keep a record of the readings and bring it to your next appointment here (or you can bring the meter itself).  You can write it on any piece of paper.  please call us sooner if your blood sugar goes below 70, or if you have a lot of readings over 200.   I have sent a prescription to your pharmacy, to start insulin, 15 units each morning.  Please continue the same diabetes pills for now. Please come back for a follow-up appointment in 2-3 weeks. Please call or message [place] next week, to tell us how the blood sugar is doing.

## 2016-11-10 NOTE — Progress Notes (Signed)
Subjective:    Patient ID: [name], male    DOB: 1945/05/09, 71 y.o.   MRN: [id number]  HPI pt is referred by Jenny Reichmann, for diabetes.  Pt states DM was dx'ed in 2006-05-01; he has moderate neuropathy of the lower extremities, and associated CAD; he has never been on insulin; pt says his diet and exercise are good; he has never had pancreatitis, pancreatic surgery, severe hypoglycemia or DKA.  He takes 2 oral meds.  He did not tolerate metformin (diarrhea).   Past Medical History:  Diagnosis Date  . CALLUS, RIGHT FOOT 12/08/2009  . Cardiac arrest ([place])   . Chronic systolic heart failure ([place]) 11/03/2009  . DIABETES MELLITUS, TYPE II 12/05/2006  . 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 HYPERLIPIDEMIA 11/25/2006  . HYPOGONADISM, MALE 12/05/2006  . Implantable cardiac defibrillator MDT 09/22/2008   Change out feb 2011, 2015  . Ischemic cardiomyopathy 09/22/2008   DES CX and RCA  70% residual LAD  Done in [place]; EF 30%;; myoviewq May 01, 2006 no ischemia  . Kidney stones   . Long term (current) use of anticoagulants 04/25/2010  . Myocardial infarction [facility]) 2004-05-01   "I died and they brought me back"  . 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 NEPHROLITHIASIS, HX OF 02/25/2008  . Persistent atrial fibrillation ([place]) 11/25/2006   Inapp shock 05-01-10 AF VR >220 recurrent    Past Surgical History:  Procedure Laterality Date  . CARDIAC CATHETERIZATION     feburary 05/01/2012  . CARDIAC DEFIBRILLATOR PLACEMENT     Guidant Vitality [name]  . CARDIOVERSION N/A 04/25/2012   Procedure: CARDIOVERSION;  Surgeon: Darlin Coco, MD;  Location: [facility] ENDOSCOPY;  Service: Cardiovascular;  Laterality: N/A;  . CARDIOVERSION N/A 05/21/2012   Procedure: CARDIOVERSION;  Surgeon: Deboraha Sprang, MD;  Location: [facility];  Service: Cardiovascular;  Laterality: N/A;  . CATARACT EXTRACTION    . EYE SURGERY     cataracts  . IMPLANTABLE CARDIOVERTER DEFIBRILLATOR (ICD) GENERATOR CHANGE N/A 02/05/2014   Procedure: ICD  GENERATOR CHANGE;  Surgeon: Deboraha Sprang, MD;  Location: [facility] CATH LAB;  Service: Cardiovascular;  Laterality: N/A;  . PTCA    . TONSILLECTOMY    . TONSILLECTOMY      Social History   Social History  . Marital status: Divorced    Spouse name: N/A  . Number of children: 1  . Years of education: 48   Occupational History  . 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 Smoking status: Never Smoker  . Smokeless tobacco: Never Used  . Alcohol use 0.0 oz/week     Comment: summer   . Drug use: No  . Sexual activity: Yes    Partners: Female   Other Topics Concern  . Not on file   Social History Narrative   HSG, College grad. Married - divorced. 1 daughter. owner/operator Research officer, [employer] business and show production    Current Outpatient Prescriptions on File Prior to Visit  Medication Sig Dispense Refill  . Blood Glucose Monitoring Suppl (ONE TOUCH ULTRA SYSTEM KIT) W/DEVICE KIT 1 kit by Does not apply route once. 1 each 0  . carvedilol (COREG) 12.5 MG tablet Take 1 tablet (12.5 mg total) by mouth 2 (two) times daily with a meal. 180 tablet 5  . dofetilide (TIKOSYN) 500 MCG capsule TAKE 1 CAPSULE BY MOUTH 2 TIMES DAILY 60 capsule 0  . fluticasone (FLONASE) 50 MCG/ACT nasal spray  Instill 2 sprays into both nostrils daily. (Patient taking differently: Instill 2 sprays into both nostrils daily as needed for allergies) 16 g 5  . furosemide (LASIX) 20 MG tablet Take 1 tablet (20 mg total) by mouth daily. OVERDUE FOR FOLLOW UP. PLEASE CALL AND SCHEDULE 15 tablet 0  . glipiZIDE (GLUCOTROL XL) 10 MG 24 hr tablet Take 1 tablet (10 mg total) by mouth daily with breakfast. **PT NEEDS APPOINTMENT FOR ADDITIONAL REFILLS** 30 tablet 0  . glucose blood (ONE TOUCH TEST STRIPS) test strip Use as instructed up to four times per day   250.02 100 each 12  . Lancets MISC Use as directed up to 4 times per day 100 each 11  . lisinopril  (PRINIVIL,ZESTRIL) 5 MG tablet Take 5 mg by mouth daily.    Marland Kitchen loratadine (CLARITIN) 10 MG tablet Take 10 mg by mouth daily.    . Magnesium Oxide (MAG-OX 400 PO) Take 1 tablet by mouth 2 (two) times daily.    . Multiple Vitamins-Minerals (MULTIVITAMIN PO) Take 1 tablet by mouth daily.    Marland Kitchen NITROSTAT 0.4 MG SL tablet Place 1 tablet (0.4 mg total) under the tongue every 5 (five) minutes as needed for chest pain. 25 tablet 5  . pioglitazone (ACTOS) 30 MG tablet Take 1 tablet (30 mg total) by mouth daily. 90 tablet 3  . ranolazine (RANEXA) 500 MG 12 hr tablet Take 1 tablet (500 mg total) by mouth 2 (two) times daily. 60 tablet 6  . XARELTO 20 MG TABS tablet TAKE 1 TABLET BY MOUTH EVERY DAY 30 tablet 0  . zolpidem (AMBIEN) 5 MG tablet TAKE 1 OR 2 TABLETS BY MOUTH AT BEDTIME AS NEEDED 180 tablet 1  . sucralfate (CARAFATE) 1 g tablet Take 1 g by mouth 4 (four) times daily -  with meals and at bedtime.    Marland Kitchen tiZANidine (ZANAFLEX) 4 MG tablet Take 1 tablet (4 mg total) by mouth every 6 (six) hours as needed for muscle spasms. (Patient not taking: Reported on 11/10/2016) 30 tablet 0   No current facility-administered medications on file prior to visit.     Allergies  Allergen Reactions  . Salmon [Fish Allergy] Other (See Comments)    Only canned/ not fresh (probably preservatives).   . Metformin And Related Other (See Comments)    diarrhea    Family History  Problem Relation Age of Onset  . Diabetes Maternal Grandmother   . Hyperlipidemia Maternal Grandmother   . Breast cancer Mother   . Cancer Neg Hx   . COPD Neg Hx   . Heart disease Neg Hx     BP 116/78   Pulse (!) 57   Wt 190 lb 9.6 oz (86.5 kg)   SpO2 98%   BMI 27.35 kg/m    Review of Systems denies weight loss, blurry vision, headache, chest pain, sob, n/v, urinary frequency, excessive diaphoresis, depression, cold intolerance, rhinorrhea, and easy bruising.  He has chronic bilat foot pain and leg cramps.      Objective:    Physical Exam VS: see vs page GEN: no distress HEAD: head: no deformity eyes: no periorbital swelling, no proptosis external nose and ears are normal mouth: no lesion seen NECK: supple, thyroid is not enlarged CHEST WALL: no deformity LUNGS: clear to auscultation CV: reg rate and rhythm, no murmur ABD: abdomen is soft, nontender.  no hepatosplenomegaly.  not distended.  no hernia MUSCULOSKELETAL: muscle bulk and strength are grossly normal.  no obvious joint swelling.  gait is normal and steady EXTEMITIES: no deformity.  no ulcer on the feet.  feet are of normal color and temp.  Trace bilat leg edema.  There is bilateral onychomycosis of the toenails PULSES: dorsalis pedis intact bilat.  no carotid bruit NEURO:  cn 2-12 grossly intact.   readily moves all 4's.  sensation is intact to touch on the feet, but decreased from normal  SKIN:  Normal texture and temperature.  No rash or suspicious lesion is visible.   NODES:  None palpable at the neck PSYCH: alert, well-oriented.  Does not appear anxious nor depressed.     Lab Results  Component Value Date   HGBA1C 11.1 (H) 10/12/2016   Lab Results  Component Value Date   CREATININE 1.07 10/12/2016   BUN 17 10/12/2016   NA 133 (L) 10/12/2016   K 4.7 10/12/2016   CL 96 10/12/2016   CO2 27 10/12/2016   I personally reviewed electrocardiogram tracing (12/24/15): Indication: abnormal muscle movements Impression: SB, with LBBB.  No MI.  No hypertrophy. Compared to 10/15/15: possible AF is resolved  I have reviewed outside records, and summarized: Pt was noted to have elevated a1c, and referred here.  He was seen for wellness, but multiple med probs were addressed.       Assessment & Plan:  Insulin-requiring type 2 DM, with CAD, new to me: severe exacerbation: I demonstrated the insulin pen.  He says he can use it.  Patient Instructions  good diet and exercise significantly improve the control of your diabetes.  please let me know if  you wish to be referred to a dietician.  high blood sugar is very risky to your health.  you should see an eye doctor and dentist every year.  It is very important to get all recommended vaccinations.  Controlling your blood pressure and cholesterol drastically reduces the damage diabetes does to your body.  Those who smoke should quit.  Please discuss these with your doctor.  check your blood sugar twice a day.  vary the time of day when you check, between before the 3 meals, and at bedtime.  also check if you have symptoms of your blood sugar being too high or too low.  please keep a record of the readings and bring it to your next appointment here (or you can bring the meter itself).  You can write it on any piece of paper.  please call us sooner if your blood sugar goes below 70, or if you have a lot of readings over 200.   I have sent a prescription to your pharmacy, to start insulin, 15 units each morning.  Please continue the same diabetes pills for now. Please come back for a follow-up appointment in 2-3 weeks. Please call or message [place] next week, to tell us how the blood sugar is doing.

## 2016-11-21 ENCOUNTER — Ambulatory Visit (INDEPENDENT_AMBULATORY_CARE_PROVIDER_SITE_OTHER): Payer: Medicare Other | Admitting: Podiatry

## 2016-11-21 ENCOUNTER — Encounter: Payer: Self-pay | Admitting: Podiatry

## 2016-11-21 VITALS — BP 139/85 | HR 60

## 2016-11-21 DIAGNOSIS — M2011 Hallux valgus (acquired), right foot: Secondary | ICD-10-CM

## 2016-11-21 DIAGNOSIS — M722 Plantar fascial fibromatosis: Secondary | ICD-10-CM | POA: Diagnosis not present

## 2016-11-21 DIAGNOSIS — E1142 Type 2 diabetes mellitus with diabetic polyneuropathy: Secondary | ICD-10-CM

## 2016-11-21 NOTE — Patient Instructions (Signed)
The thickening of the arch bands on the bottom right and left feet are most consistent with plantar fibromatosis, benign thickening of the arch bands in your feet that been present for 20+ years  Diabetes and Foot Care Diabetes may cause you to have problems because of poor blood supply (circulation) to your feet and legs. This may cause the skin on your feet to become thinner, break easier, and heal more slowly. Your skin may become dry, and the skin may peel and crack. You may also have nerve damage in your legs and feet causing decreased feeling in them. You may not notice minor injuries to your feet that could lead to infections or more serious problems. Taking care of your feet is one of the most important things you can do for yourself. Follow these instructions at home:  Wear shoes at all times, even in the house. Do not go barefoot. Bare feet are easily injured.  Check your feet daily for blisters, cuts, and redness. If you cannot see the bottom of your feet, use a mirror or ask someone for help.  Wash your feet with warm water (do not use hot water) and mild soap. Then pat your feet and the areas between your toes until they are completely dry. Do not soak your feet as this can dry your skin.  Apply a moisturizing lotion or petroleum jelly (that does not contain alcohol and is unscented) to the skin on your feet and to dry, brittle toenails. Do not apply lotion between your toes.  Trim your toenails straight across. Do not dig under them or around the cuticle. File the edges of your nails with an emery board or nail file.  Do not cut corns or calluses or try to remove them with medicine.  Wear clean socks or stockings every day. Make sure they are not too tight. Do not wear knee-high stockings since they may decrease blood flow to your legs.  Wear shoes that fit properly and have enough cushioning. To break in new shoes, wear them for just a few hours a day. This prevents you from  injuring your feet. Always look in your shoes before you put them on to be sure there are no objects inside.  Do not cross your legs. This may decrease the blood flow to your feet.  If you find a minor scrape, cut, or break in the skin on your feet, keep it and the skin around it clean and dry. These areas may be cleansed with mild soap and water. Do not cleanse the area with peroxide, alcohol, or iodine.  When you remove an adhesive bandage, be sure not to damage the skin around it.  If you have a wound, look at it several times a day to make sure it is healing.  Do not use heating pads or hot water bottles. They may burn your skin. If you have lost feeling in your feet or legs, you may not know it is happening until it is too late.  Make sure your health care provider performs a complete foot exam at least annually or more often if you have foot problems. Report any cuts, sores, or bruises to your health care provider immediately. Contact a health care provider if:  You have an injury that is not healing.  You have cuts or breaks in the skin.  You have an ingrown nail.  You notice redness on your legs or feet.  You feel burning or tingling in your legs  or feet.  You have pain or cramps in your legs and feet.  Your legs or feet are numb.  Your feet always feel cold. Get help right away if:  There is increasing redness, swelling, or pain in or around a wound.  There is a red line that goes up your leg.  Pus is coming from a wound.  You develop a fever or as directed by your health care provider.  You notice a bad smell coming from an ulcer or wound. This information is not intended to replace advice given to you by your health care provider. Make sure you discuss any questions you have with your health care provider. Document Released: 02/12/2000 Document Revised: 07/23/2015 Document Reviewed: 07/24/2012 Elsevier Interactive Patient Education  2017 [employer].

## 2016-11-21 NOTE — Progress Notes (Signed)
   Subjective:    Patient ID: [name], male    DOB: 09/28/1945, 71 y.o.   MRN: [id number]  HPI This patient presents today requesting a diabetic foot exam at this suggests and his primary care physician. Patient denies any history of ulceration, claudication or amputation. He describes a 20+ year history of thickening in the areas for right and left feet. His plantar lesions have increased slightly in size over time. Patient has had no specific treatment He complains of occasional discomfort in the right bunion wearing certain shoes, however, rather comfortably wears a soft shoe  Patient denies smoking history    Review of Systems  Respiratory: Positive for chest tightness.         Difficulty breathing  Cardiovascular: Positive for leg swelling.       Calf pain with walking  Musculoskeletal: Positive for gait problem.       Joint and muscle pain  Skin: Positive for rash.  All other systems reviewed and are negative.      Objective:   Physical Exam  Patient appears orientated 3  Vascular: No calf edema or calf tenderness bilaterally DP and PT pulses 2/4 bilaterally Capillary reflex related bilaterally  Neurological: Sensation to 10 g monofilament wire intact 5/8 right and 6/8 left Vibratory sensation reactive bilaterally Ankle reflexes reactive bilaterally  Dermatological: No open skin lesions bilaterally Skin texture and turgor within normal limits bilaterally Moderate hair growth bilaterally  Musculoskeletal: HAV right Palpable thickening within the plantar fascial band right medial mid arch 2 lesions approximately 30 x 30 mm Palpable thickening within the plantar fascia left medial band 30 mm x 30 mm and a more proximal lesion 15 x 20 mm Manual motor testing dorsi flexion, plantar flexion 5/5 bilaterally       Assessment & Plan:   Assessment: Diabetic with satisfactory vascular status Diabetic peripheral neuropathy 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 There are no acute problems noted today. I reviewed generalized diabetic foot care with patient  Reappoint yearly or if patient has a specific concern

## 2016-11-22 ENCOUNTER — Encounter: Payer: Self-pay | Admitting: Internal Medicine

## 2016-12-05 ENCOUNTER — Other Ambulatory Visit: Payer: Self-pay | Admitting: Internal Medicine

## 2016-12-05 ENCOUNTER — Ambulatory Visit (INDEPENDENT_AMBULATORY_CARE_PROVIDER_SITE_OTHER): Payer: Medicare Other | Admitting: Endocrinology

## 2016-12-05 ENCOUNTER — Encounter: Payer: Self-pay | Admitting: Endocrinology

## 2016-12-05 VITALS — BP 110/62 | HR 72 | Wt 196.0 lb

## 2016-12-05 DIAGNOSIS — E0842 Diabetes mellitus due to underlying condition with diabetic polyneuropathy: Secondary | ICD-10-CM | POA: Diagnosis not present

## 2016-12-05 DIAGNOSIS — Z23 Encounter for immunization: Secondary | ICD-10-CM

## 2016-12-05 LAB — POCT GLYCOSYLATED HEMOGLOBIN (HGB A1C): Hemoglobin A1C: 8.7

## 2016-12-05 NOTE — Patient Instructions (Addendum)
check your blood sugar twice a day.  vary the time of day when you check, between before the 3 meals, and at bedtime.  also check if you have symptoms of your blood sugar being too high or too low.  please keep a record of the readings and bring it to your next appointment here (or you can bring the meter itself).  You can write it on any piece of paper.  please call us sooner if your blood sugar goes below 70, or if you have a lot of readings over 200.   For now, please: Stop taking the glipizide, and: Please continue the same insulin.  Please continue the same pioglitizone.  Please come back for a follow-up appointment in 2 months.   Please call or message [place] in a few days, to tell us how the blood sugar is doing.  This is important, because we will probably have to increase the insulin.

## 2016-12-05 NOTE — Progress Notes (Signed)
Subjective:    Patient ID: [name], male    DOB: 02-18-1946, 71 y.o.   MRN: [id number]  HPI Pt returns for f/u of diabetes mellitus: DM type: Insulin-requiring type 2 Dx'ed: 5027 Complications: polyneuropathy and CAD Therapy: insulin since mid-2018 DKA: never Severe hypoglycemia: never Pancreatitis: never Pancreatic imaging:  Other: He did not tolerate metformin (diarrhea) Interval history: He brings a record of his cbg's which I have reviewed today.  It varies from 111-176.  There is no trend throughout the day. Past Medical History:  Diagnosis Date  . CALLUS, RIGHT FOOT 12/08/2009  . Cardiac arrest ([place])   . Chronic systolic heart failure ([place]) 11/03/2009  . DIABETES MELLITUS, TYPE II 12/05/2006  . 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 HYPERLIPIDEMIA 11/25/2006  . HYPOGONADISM, MALE 12/05/2006  . Implantable cardiac defibrillator MDT 09/22/2008   Change out feb 2011, 2015  . Ischemic cardiomyopathy 09/22/2008   DES CX and RCA  70% residual LAD  Done in [place]; EF 30%;; myoviewq 05/02/2006 no ischemia  . Kidney stones   . Long term (current) use of anticoagulants 04/25/2010  . Myocardial infarction [facility]) 05/02/04   "I died and they brought me back"  . 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 NEPHROLITHIASIS, HX OF 02/25/2008  . Persistent atrial fibrillation ([place]) 11/25/2006   Inapp shock May 02, 2010 AF VR >220 recurrent    Past Surgical History:  Procedure Laterality Date  . CARDIAC CATHETERIZATION     feburary 05-02-2012  . CARDIAC DEFIBRILLATOR PLACEMENT     Guidant Vitality [name]  . CARDIOVERSION N/A 04/25/2012   Procedure: CARDIOVERSION;  Surgeon: Darlin Coco, MD;  Location: [facility] ENDOSCOPY;  Service: Cardiovascular;  Laterality: N/A;  . CARDIOVERSION N/A 05/21/2012   Procedure: CARDIOVERSION;  Surgeon: Deboraha Sprang, MD;  Location: [place];  Service: Cardiovascular;  Laterality: N/A;  . CATARACT EXTRACTION    . EYE SURGERY     cataracts  . IMPLANTABLE  CARDIOVERTER DEFIBRILLATOR (ICD) GENERATOR CHANGE N/A 02/05/2014   Procedure: ICD GENERATOR CHANGE;  Surgeon: Deboraha Sprang, MD;  Location: [facility] CATH LAB;  Service: Cardiovascular;  Laterality: N/A;  . PTCA    . TONSILLECTOMY    . TONSILLECTOMY      Social History   Social History  . Marital status: Divorced    Spouse name: N/A  . Number of children: 1  . Years of education: 71   Occupational History  . 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 Smoking status: Never Smoker  . Smokeless tobacco: Never Used  . Alcohol use 0.0 oz/week     Comment: summer   . Drug use: No  . Sexual activity: Yes    Partners: Female   Other Topics Concern  . Not on file   Social History Narrative   HSG, College grad. Married - divorced. 1 daughter. owner/operator Research officer, trade union business and show production    Current Outpatient Prescriptions on File Prior to Visit  Medication Sig Dispense Refill  . Blood Glucose Monitoring Suppl (ONE TOUCH ULTRA SYSTEM KIT) W/DEVICE KIT 1 kit by Does not apply route once. 1 each 0  . carvedilol (COREG) 12.5 MG tablet Take 1 tablet (12.5 mg total) by mouth 2 (two) times daily with a meal. 180 tablet 5  . dofetilide (TIKOSYN) 500 MCG capsule TAKE 1 CAPSULE BY MOUTH 2 TIMES DAILY 60 capsule 0  . fluticasone (FLONASE) 50 MCG/ACT nasal spray  Instill 2 sprays into both nostrils daily. (Patient taking differently: Instill 2 sprays into both nostrils daily as needed for allergies) 16 g 5  . furosemide (LASIX) 20 MG tablet Take 1 tablet (20 mg total) by mouth daily. OVERDUE FOR FOLLOW UP. PLEASE CALL AND SCHEDULE 15 tablet 0  . glucose blood (ONE TOUCH TEST STRIPS) test strip Use as instructed up to four times per day   250.02 100 each 12  . Insulin Glargine (LANTUS SOLOSTAR) 100 UNIT/ML Solostar Pen Inject 15 Units into the skin every morning. And pen needles 1/day 5 pen PRN  . Lancets MISC Use as directed up to  4 times per day 100 each 11  . lisinopril (PRINIVIL,ZESTRIL) 5 MG tablet Take 5 mg by mouth daily.    Marland Kitchen loratadine (CLARITIN) 10 MG tablet Take 10 mg by mouth daily.    . Magnesium Oxide (MAG-OX 400 PO) Take 1 tablet by mouth 2 (two) times daily.    . Multiple Vitamins-Minerals (MULTIVITAMIN PO) Take 1 tablet by mouth daily.    Marland Kitchen NITROSTAT 0.4 MG SL tablet Place 1 tablet (0.4 mg total) under the tongue every 5 (five) minutes as needed for chest pain. 25 tablet 5  . pioglitazone (ACTOS) 30 MG tablet Take 1 tablet (30 mg total) by mouth daily. 90 tablet 3  . ranolazine (RANEXA) 500 MG 12 hr tablet Take 1 tablet (500 mg total) by mouth 2 (two) times daily. 60 tablet 6  . sucralfate (CARAFATE) 1 g tablet Take 1 g by mouth 4 (four) times daily -  with meals and at bedtime.    Marland Kitchen tiZANidine (ZANAFLEX) 4 MG tablet Take 1 tablet (4 mg total) by mouth every 6 (six) hours as needed for muscle spasms. (Patient not taking: Reported on 11/10/2016) 30 tablet 0  . zolpidem (AMBIEN) 5 MG tablet TAKE 1 OR 2 TABLETS BY MOUTH AT BEDTIME AS NEEDED 180 tablet 1   No current facility-administered medications on file prior to visit.     Allergies  Allergen Reactions  . Salmon [Fish Allergy] Other (See Comments)    Only canned/ not fresh (probably preservatives).   . Metformin And Related Other (See Comments)    diarrhea    Family History  Problem Relation Age of Onset  . Diabetes Maternal Grandmother   . Hyperlipidemia Maternal Grandmother   . Breast cancer Mother   . Cancer Neg Hx   . COPD Neg Hx   . Heart disease Neg Hx     BP 110/62   Pulse 72   Wt 196 lb (88.9 kg)   SpO2 98%   BMI 28.12 kg/m    Review of Systems He denies hypoglycemia.      Objective:   Physical Exam VITAL SIGNS:  See vs page GENERAL: no distress Pulses: foot pulses are intact bilaterally.   MSK: no deformity of the feet or ankles.  CV: no edema of the legs or ankles Skin:  no ulcer on the feet or ankles.  normal color  and temp on the feet and ankles Neuro: sensation is intact to touch on the feet and ankles.      Lab Results  Component Value Date   HGBA1C 8.7 12/05/2016      Assessment & Plan:  Insulin-requiring type 2 DM, with CAD: improved, but he needs to simplify regimen.  Patient Instructions  check your blood sugar twice a day.  vary the time of day when you check, between before the 3 meals, and  at bedtime.  also check if you have symptoms of your blood sugar being too high or too low.  please keep a record of the readings and bring it to your next appointment here (or you can bring the meter itself).  You can write it on any piece of paper.  please call us sooner if your blood sugar goes below 70, or if you have a lot of readings over 200.   For now, please: Stop taking the glipizide, and: Please continue the same insulin.  Please continue the same pioglitizone.  Please come back for a follow-up appointment in 2 months.   Please call or message [place] in a few days, to tell us how the blood sugar is doing.  This is important, because we will probably have to increase the insulin.

## 2016-12-05 NOTE — Progress Notes (Signed)
8.78 

## 2016-12-06 ENCOUNTER — Encounter: Payer: Self-pay | Admitting: Endocrinology

## 2016-12-06 ENCOUNTER — Other Ambulatory Visit: Payer: Self-pay | Admitting: Internal Medicine

## 2016-12-07 NOTE — Telephone Encounter (Signed)
Pt has had a few high BS last night and this AM it was over 200

## 2016-12-20 ENCOUNTER — Telehealth: Payer: Self-pay | Admitting: Internal Medicine

## 2016-12-20 MED ORDER — GLUCOSE BLOOD VI STRP: 1.0000 | ORAL_STRIP | Freq: Two times a day (BID) | 2 refills | Status: AC

## 2016-12-20 NOTE — Telephone Encounter (Signed)
Reviewed chart pt is up-to-date sent refills to [facility].../lmb  

## 2016-12-20 NOTE — Telephone Encounter (Signed)
Pharmacy called for a refill of  glucose blood (ONE TOUCH TEST STRIPS) test strip Please advise  [facility]

## 2016-12-28 ENCOUNTER — Telehealth: Payer: Self-pay | Admitting: Endocrinology

## 2016-12-28 NOTE — Telephone Encounter (Signed)
Called and patient stated that he would call with in the next week to give CBG's

## 2016-12-28 NOTE — Telephone Encounter (Signed)
Patient's blood sugar is 221 today, yesterday it was 251, then 183, 210 respectively. Dr. Rosanna Randy to know if patient's blood sugar went over 200 which it has. Please call patient and advise.

## 2016-12-28 NOTE — Telephone Encounter (Signed)
Please increase the insulin to 30 units qam Please call or message [place] next week, to tell us how the blood sugar is doing

## 2017-01-02 ENCOUNTER — Other Ambulatory Visit: Payer: Self-pay | Admitting: Internal Medicine

## 2017-01-02 DIAGNOSIS — Z45018 Encounter for adjustment and management of other part of cardiac pacemaker: Secondary | ICD-10-CM | POA: Diagnosis not present

## 2017-01-02 DIAGNOSIS — Z4502 Encounter for adjustment and management of automatic implantable cardiac defibrillator: Secondary | ICD-10-CM | POA: Diagnosis not present

## 2017-01-03 ENCOUNTER — Encounter: Payer: Self-pay | Admitting: Endocrinology

## 2017-01-11 ENCOUNTER — Other Ambulatory Visit: Payer: Self-pay | Admitting: Endocrinology

## 2017-01-11 ENCOUNTER — Telehealth: Payer: Self-pay | Admitting: Endocrinology

## 2017-01-11 NOTE — Telephone Encounter (Signed)
Pharmacy sent request for [place] -patient said he was increased to 40 units per day-but prescription was for original 15 units. Please call pharmacy and clarify ph# [phone]

## 2017-01-12 ENCOUNTER — Other Ambulatory Visit: Payer: Self-pay

## 2017-01-12 ENCOUNTER — Telehealth: Payer: Self-pay

## 2017-01-12 MED ORDER — INSULIN GLARGINE 100 UNIT/ML SOLOSTAR PEN: 40.0000 [IU] | PEN_INJECTOR | SUBCUTANEOUS | 3 refills | Status: DC

## 2017-01-12 NOTE — Telephone Encounter (Signed)
Follow up  Pharmacist verbalized pt was originally on 40 units and RX was sent for 15 units and insurance will not cover 15 units for another month and pt has only 2-3 days left.  Pharmacist voiced to f/u with her and pt

## 2017-01-12 NOTE — Telephone Encounter (Signed)
I have sent a prescription to your pharmacy, to increase to 40 units qam

## 2017-01-12 NOTE — Telephone Encounter (Signed)
See note. I see nothing in patient's chart stating that he was increased to 40 units? Please advise?

## 2017-01-12 NOTE — Telephone Encounter (Signed)
I spoke with pharmacist & she stated that patient only has 3 days left on insulin because had been taking the units. Insurance will only cover if its a new prescription with new directions. Both patient & pharmacist are concerned.

## 2017-01-12 NOTE — Telephone Encounter (Signed)
Patient notified that prescription was sent. 

## 2017-01-31 ENCOUNTER — Other Ambulatory Visit: Payer: Self-pay | Admitting: Internal Medicine

## 2017-01-31 NOTE — Telephone Encounter (Signed)
Please advise on refill request as patient has had multiple refills with a note to call and schedule an appointment with Dr Caryl Comes, but he has failed to do so. He has not been seen here since 09/08/15 but has been following regularly by [facility]. Thanks, MI

## 2017-02-04 IMAGING — CT CT HEAD W/O CM
1 series · 16 of 30 positions shown, 20 images · non-contrast
Comparison: 05/15/2012.

CLINICAL DATA: Dizziness with headache for several weeks. History
of atrial fibrillation. History of diabetes mellitus.

EXAM:
CT HEAD WITHOUT CONTRAST
TECHNIQUE: Contiguous axial images were obtained from the base of the skull
through the vertex without intravenous contrast.

[Series 3: head 5.0 h37s · axial · 0.49mm/px · z∈[+1272,+1422]mm · 16 of 34 slices shown, 20 images]
[im 2/34  brain]
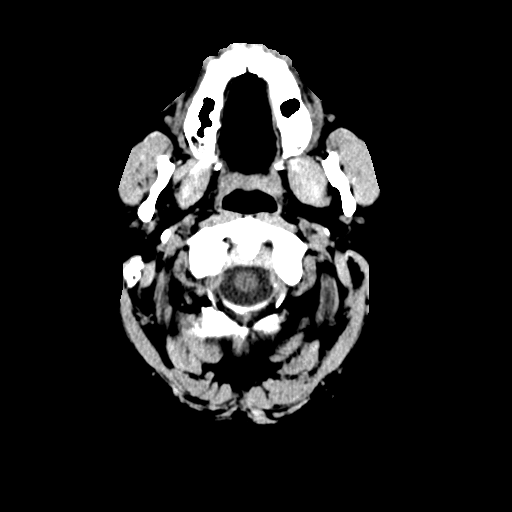
[im 2/34  bone]
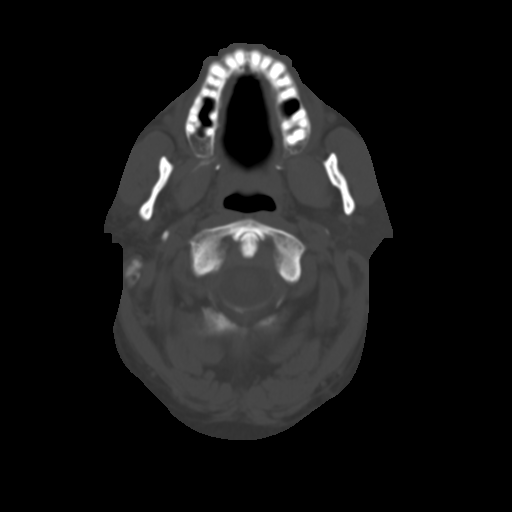
[im 4/34  brain]
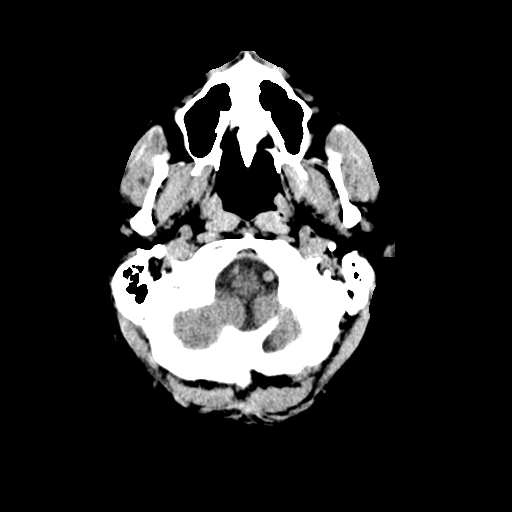
[im 6/34  brain]
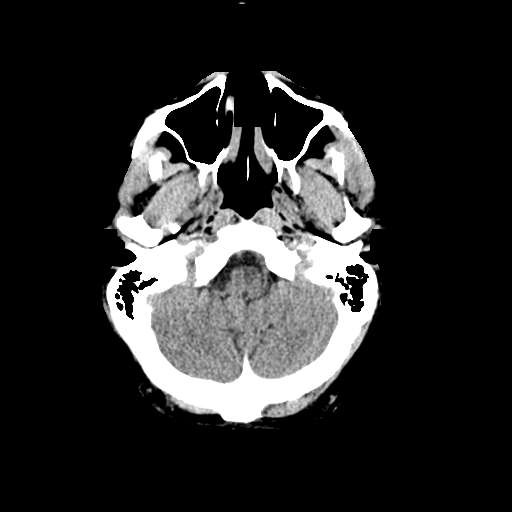
[im 8/34  brain]
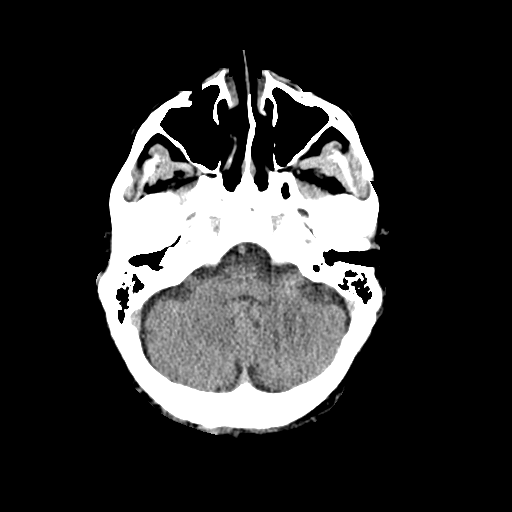
[im 10/34  brain]
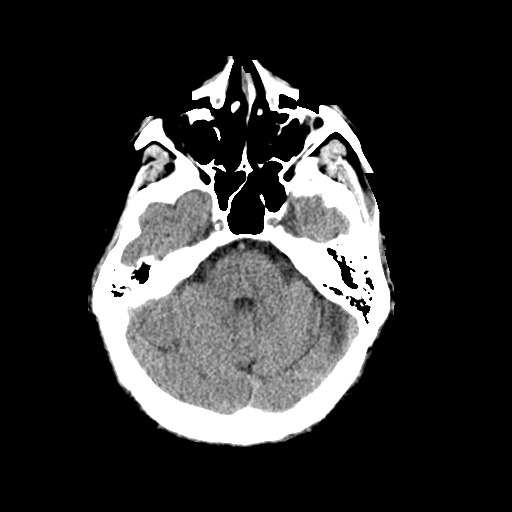
[im 10/34  bone]
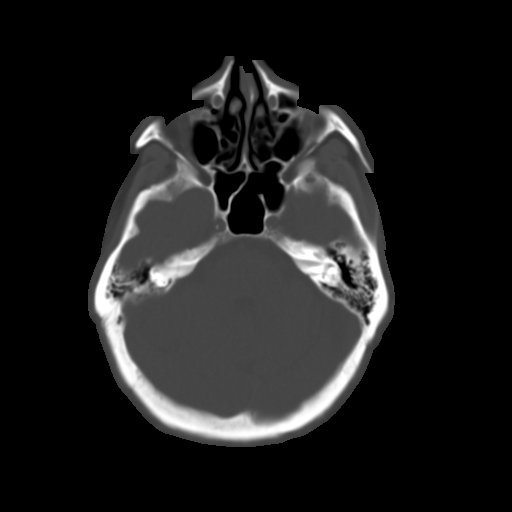
[im 12/34  brain]
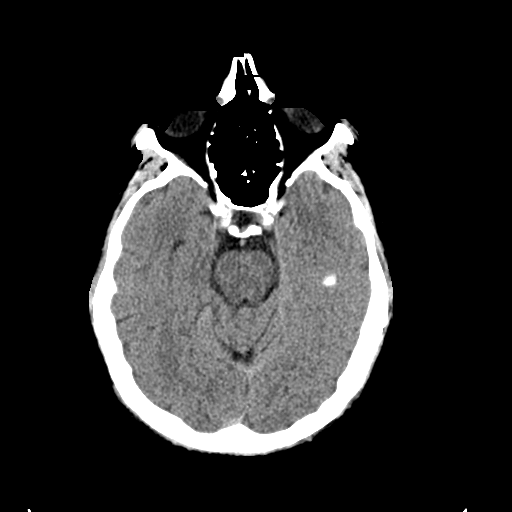
[im 14/34  brain]
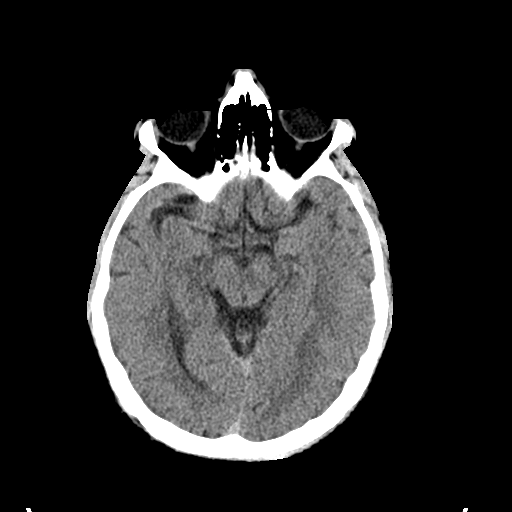
[im 16/34  brain]
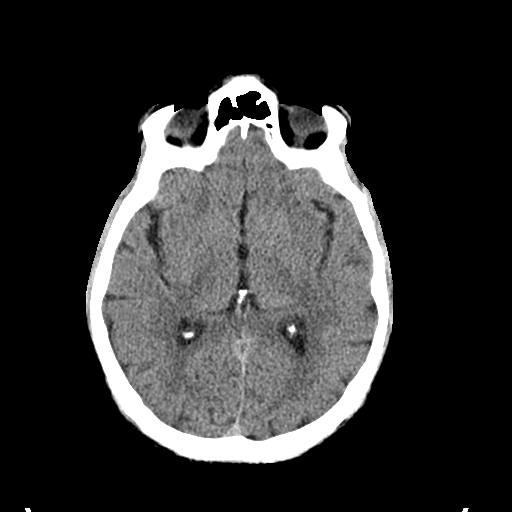
[im 18/34  brain]
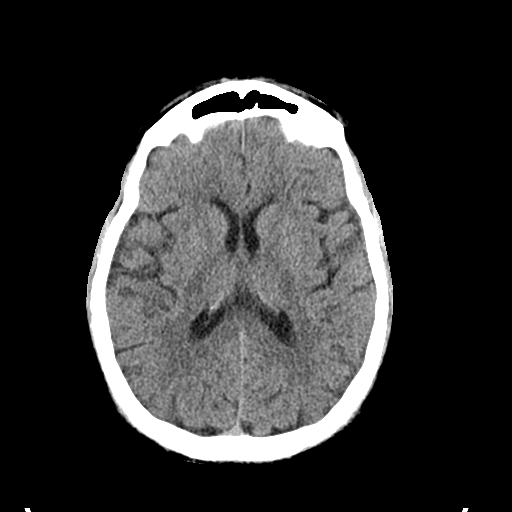
[im 18/34  bone]
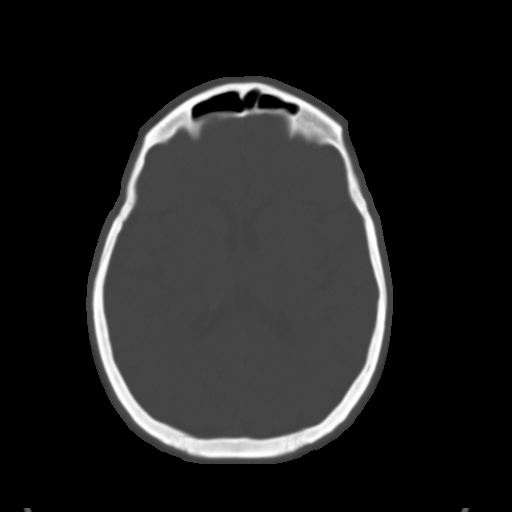
[im 20/34  brain]
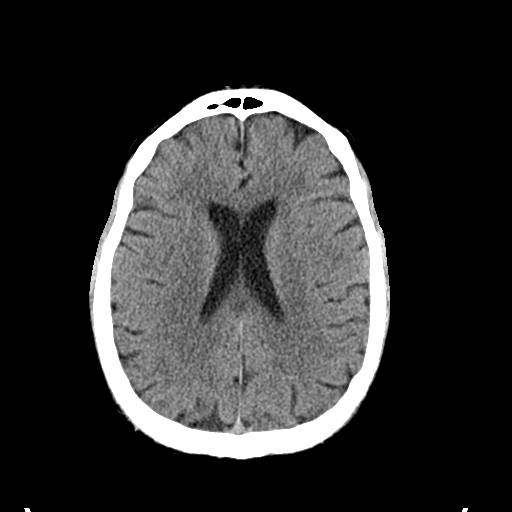
[im 22/34  brain]
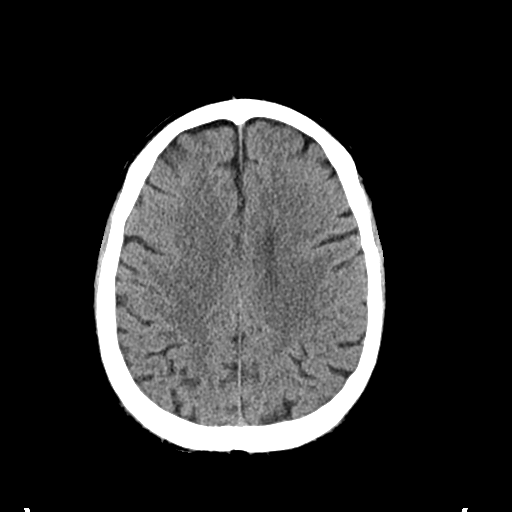
[im 24/34  brain]
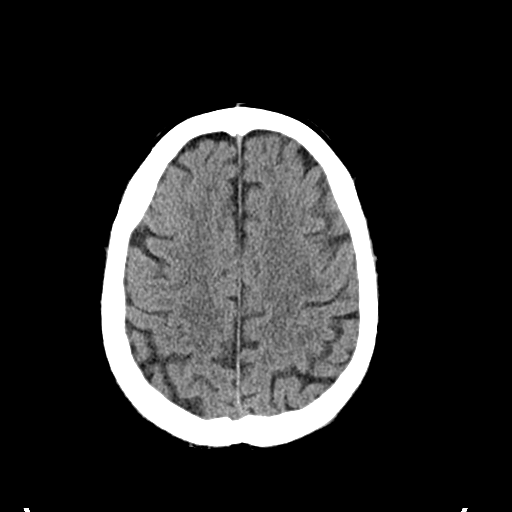
[im 26/34  brain]
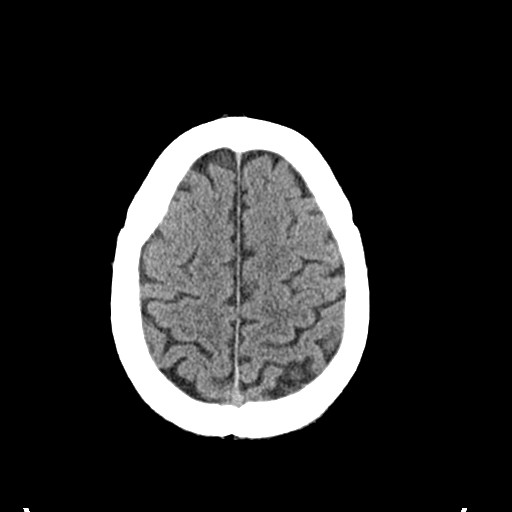
[im 26/34  bone]
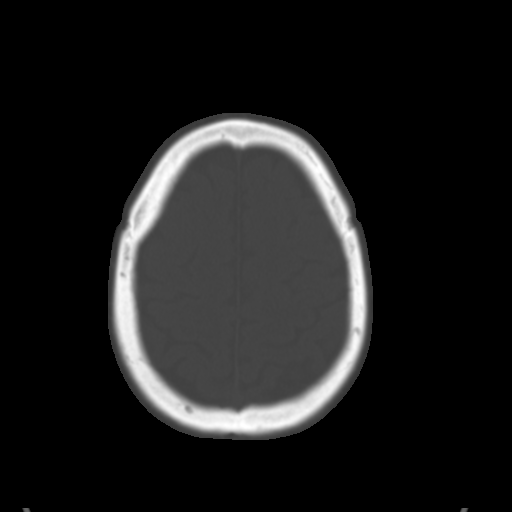
[im 28/34  brain]
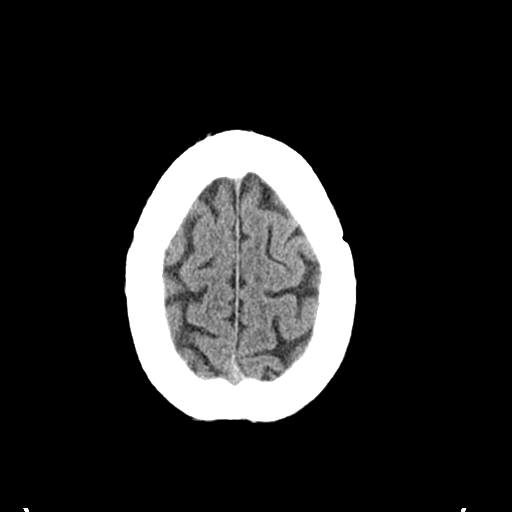
[im 30/34  brain]
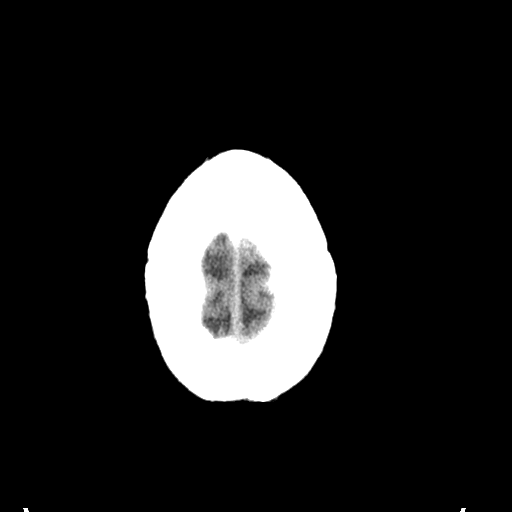
[im 32/34  brain]
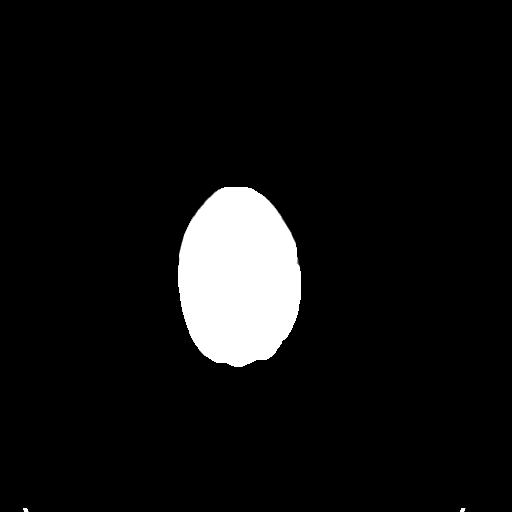

[16 of 30 positions shown; findings below may reference images not displayed]

FINDINGS: No evidence for acute infarction, hemorrhage, mass lesion,
hydrocephalus, or extra-axial fluid. Normal for age cerebral volume.
Minor hypoattenuation of white matter, likely chronic microvascular
ischemic change.

Calvarium intact. Mild vascular calcification. No sinus or mastoid
disease. Grossly negative orbits.
IMPRESSION: Negative exam. No acute intracranial findings. Similar appearance to
priors.

## 2017-02-06 ENCOUNTER — Ambulatory Visit: Payer: Medicare Other | Admitting: Endocrinology

## 2017-03-03 ENCOUNTER — Encounter: Payer: Self-pay | Admitting: Endocrinology

## 2017-03-03 ENCOUNTER — Ambulatory Visit: Payer: Medicare Other | Admitting: Endocrinology

## 2017-03-03 VITALS — BP 102/68 | HR 69 | Wt 199.6 lb

## 2017-03-03 DIAGNOSIS — Z23 Encounter for immunization: Secondary | ICD-10-CM | POA: Diagnosis not present

## 2017-03-03 DIAGNOSIS — E0842 Diabetes mellitus due to underlying condition with diabetic polyneuropathy: Secondary | ICD-10-CM

## 2017-03-03 LAB — POCT GLYCOSYLATED HEMOGLOBIN (HGB A1C): HEMOGLOBIN A1C: 7.9

## 2017-03-03 NOTE — Progress Notes (Signed)
Subjective:    Patient ID: [name], male    DOB: [birth year], 72 y.o.   MRN: [id number]  HPI Pt returns for f/u of diabetes mellitus: DM type: Insulin-requiring type 2 Dx'ed: 5697 Complications: polyneuropathy and CAD Therapy: insulin since mid-2018, and pioglitizone.  DKA: never Severe hypoglycemia: never.   Pancreatitis: never Pancreatic imaging: never Other: He did not tolerate metformin (diarrhea); he is not a V-GO candidate, due to cost, per pt.  He declines multiple daily injections Interval history: he brings a record of his cbg's which I have reviewed today.  It varies from 105-200's.  He says he never misses the insulin.    Past Medical History:  Diagnosis Date  . CALLUS, RIGHT FOOT 12/08/2009  . Cardiac arrest ([place])   . Chronic systolic heart failure ([place]) 11/03/2009  . DIABETES MELLITUS, TYPE II 12/05/2006  . 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 HYPERLIPIDEMIA 11/25/2006  . HYPOGONADISM, MALE 12/05/2006  . Implantable cardiac defibrillator MDT 09/22/2008   Change out feb 2011, 2015  . Ischemic cardiomyopathy 09/22/2008   DES CX and RCA  70% residual LAD  Done in [place]; EF 30%;; myoviewq 04/25/06 no ischemia  . Kidney stones   . Long term (current) use of anticoagulants 04/25/2010  . Myocardial infarction [facility]) 04/25/04   "I died and they brought me back"  . 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 NEPHROLITHIASIS, HX OF 02/25/2008  . Persistent atrial fibrillation ([place]) 11/25/2006   Inapp shock 04-25-2010 AF VR >220 recurrent    Past Surgical History:  Procedure Laterality Date  . CARDIAC CATHETERIZATION     feburary 04-25-2012  . CARDIAC DEFIBRILLATOR PLACEMENT     Guidant Vitality [name]  . CARDIOVERSION N/A 04/25/2012   Procedure: CARDIOVERSION;  Surgeon: Darlin Coco, MD;  Location: [facility] ENDOSCOPY;  Service: Cardiovascular;  Laterality: N/A;  . CARDIOVERSION N/A 05/21/2012   Procedure: CARDIOVERSION;  Surgeon: Deboraha Sprang, MD;  Location: [place];   Service: Cardiovascular;  Laterality: N/A;  . CATARACT EXTRACTION    . EYE SURGERY     cataracts  . IMPLANTABLE CARDIOVERTER DEFIBRILLATOR (ICD) GENERATOR CHANGE N/A 02/05/2014   Procedure: ICD GENERATOR CHANGE;  Surgeon: Deboraha Sprang, MD;  Location: [facility] CATH LAB;  Service: Cardiovascular;  Laterality: N/A;  . PTCA    . TONSILLECTOMY    . TONSILLECTOMY      Social History   Socioeconomic History  . Marital status: Divorced    Spouse name: Not on file  . Number of children: 1  . Years of education: 26  . Highest education level: Not on file  Social Needs  . Financial resource strain: Not on file  . Food insecurity - worry: Not on file  . Food insecurity - inability: Not on file  . Transportation needs - medical: Not on file  . Transportation needs - non-medical: Not on file  Occupational History  . 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 Smoking status: Never Smoker  . Smokeless tobacco: Never Used  Substance and Sexual Activity  . Alcohol use: Yes    Alcohol/week: 0.0 oz    Comment: summer   . Drug use: No  . Sexual activity: Yes    Partners: Female  Other Topics Concern  . Not on file  Social History Narrative   HSG, College grad. Married - divorced. 1 daughter. owner/operator Research officer, trade union business and show production  Current Outpatient Medications on File Prior to Visit  Medication Sig Dispense Refill  . Blood Glucose Monitoring Suppl (ONE TOUCH ULTRA SYSTEM KIT) W/DEVICE KIT 1 kit by Does not apply route once. 1 each 0  . carvedilol (COREG) 12.5 MG tablet Take 1 tablet (12.5 mg total) by mouth 2 (two) times daily with a meal. 180 tablet 5  . dofetilide (TIKOSYN) 500 MCG capsule TAKE 1 CAPSULE BY MOUTH 2 TIMES DAILY 60 capsule 0  . fluticasone (FLONASE) 50 MCG/ACT nasal spray Instill 2 sprays into both nostrils daily. (Patient taking differently: Instill 2 sprays into both nostrils daily as  needed for allergies) 16 g 5  . furosemide (LASIX) 20 MG tablet Take 1 tablet by mouth daily. OVERDUE FOR FOLLOW UP. PLEASE CALL AND SCHEDULE 15 tablet 0  . glucose blood (ONE TOUCH ULTRA TEST) test strip 1 each by Other route 2 (two) times daily. 200 each 2  . Insulin Glargine (LANTUS SOLOSTAR) 100 UNIT/ML Solostar Pen Inject 40 Units every morning into the skin. And pen needles 1/day 15 mL 3  . Lancets MISC Use as directed up to 4 times per day 100 each 11  . lisinopril (PRINIVIL,ZESTRIL) 5 MG tablet Take 5 mg by mouth daily.    Marland Kitchen loratadine (CLARITIN) 10 MG tablet Take 10 mg by mouth daily.    . Magnesium Oxide (MAG-OX 400 PO) Take 1 tablet by mouth 2 (two) times daily.    . Multiple Vitamins-Minerals (MULTIVITAMIN PO) Take 1 tablet by mouth daily.    Marland Kitchen NITROSTAT 0.4 MG SL tablet Place 1 tablet (0.4 mg total) under the tongue every 5 (five) minutes as needed for chest pain. 25 tablet 5  . pioglitazone (ACTOS) 30 MG tablet Take 1 tablet (30 mg total) by mouth daily. 90 tablet 3  . ranolazine (RANEXA) 500 MG 12 hr tablet Take 1 tablet (500 mg total) by mouth 2 (two) times daily. 60 tablet 6  . sucralfate (CARAFATE) 1 g tablet Take 1 g by mouth 4 (four) times daily -  with meals and at bedtime.    Marland Kitchen tiZANidine (ZANAFLEX) 4 MG tablet Take 1 tablet (4 mg total) by mouth every 6 (six) hours as needed for muscle spasms. 30 tablet 0  . XARELTO 20 MG TABS tablet TAKE 1 TABLET BY MOUTH EVERY DAY 30 tablet 5  . zolpidem (AMBIEN) 5 MG tablet TAKE 1 OR 2 TABLETS BY MOUTH AT BEDTIME AS NEEDED 180 tablet 1   No current facility-administered medications on file prior to visit.     Allergies  Allergen Reactions  . Salmon [Fish Allergy] Other (See Comments)    Only canned/ not fresh (probably preservatives).   . Metformin And Related Other (See Comments)    diarrhea    Family History  Problem Relation Age of Onset  . Diabetes Maternal Grandmother   . Hyperlipidemia Maternal Grandmother   . Breast  cancer Mother   . Cancer Neg Hx   . COPD Neg Hx   . Heart disease Neg Hx     BP 102/68 (BP Location: Left Arm, Patient Position: Sitting, Cuff Size: Normal)   Pulse 69   Wt 199 lb 9.6 oz (90.5 kg)   SpO2 97%   BMI 28.64 kg/m   Review of Systems He denies hypoglycemia    Objective:   Physical Exam VITAL SIGNS:  See vs page GENERAL: no distress Pulses: dorsalis pedis intact bilat.   MSK: no deformity of the feet CV: no leg edema  Skin:  no ulcer on the feet.  normal color and temp on the feet. Neuro: sensation is intact to touch on the feet Ext: There is bilateral onychomycosis of the toenails  Lab Results  Component Value Date   HGBA1C 7.9 03/03/2017      Assessment & Plan:  Insulin-requiring type 2 DM, with CAD: this is the best control this pt should aim for, given this regimen, which does match insulin to her changing needs throughout the day  Patient Instructions  check your blood sugar twice a day.  vary the time of day when you check, between before the 3 meals, and at bedtime.  also check if you have symptoms of your blood sugar being too high or too low.  please keep a record of the readings and bring it to your next appointment here (or you can bring the meter itself).  You can write it on any piece of paper.  please call us sooner if your blood sugar goes below 70, or if you have a lot of readings over 200.   Please continue the same medications for now.   Please come back for a follow-up appointment in 2 months.  A better option would be to slightly reduce the lantus, and add a fast-acting insulin with each meal.

## 2017-03-03 NOTE — Patient Instructions (Addendum)
check your blood sugar twice a day.  vary the time of day when you check, between before the 3 meals, and at bedtime.  also check if you have symptoms of your blood sugar being too high or too low.  please keep a record of the readings and bring it to your next appointment here (or you can bring the meter itself).  You can write it on any piece of paper.  please call us sooner if your blood sugar goes below 70, or if you have a lot of readings over 200.   Please continue the same medications for now.   Please come back for a follow-up appointment in 2 months.  A better option would be to slightly reduce the lantus, and add a fast-acting insulin with each meal.

## 2017-03-18 ENCOUNTER — Emergency Department (HOSPITAL_COMMUNITY)
Admission: EM | Admit: 2017-03-18 | Discharge: 2017-03-19 | Disposition: A | Discharge status: Home / Self Care | Payer: Medicare Other | Attending: Emergency Medicine | Admitting: Emergency Medicine

## 2017-03-18 ENCOUNTER — Encounter (HOSPITAL_COMMUNITY): Payer: Self-pay | Admitting: *Deleted

## 2017-03-18 ENCOUNTER — Other Ambulatory Visit: Payer: Self-pay

## 2017-03-18 ENCOUNTER — Emergency Department (HOSPITAL_COMMUNITY): Payer: Medicare Other

## 2017-03-18 DIAGNOSIS — Z79899 Other long term (current) drug therapy: Secondary | ICD-10-CM | POA: Diagnosis not present

## 2017-03-18 DIAGNOSIS — R000 Tachycardia, unspecified: Secondary | ICD-10-CM | POA: Diagnosis not present

## 2017-03-18 DIAGNOSIS — R0789 Other chest pain: Secondary | ICD-10-CM | POA: Insufficient documentation

## 2017-03-18 DIAGNOSIS — E119 Type 2 diabetes mellitus without complications: Secondary | ICD-10-CM | POA: Diagnosis not present

## 2017-03-18 DIAGNOSIS — R202 Paresthesia of skin: Secondary | ICD-10-CM | POA: Diagnosis not present

## 2017-03-18 DIAGNOSIS — R519 Headache, unspecified: Secondary | ICD-10-CM

## 2017-03-18 DIAGNOSIS — Z95810 Presence of automatic (implantable) cardiac defibrillator: Secondary | ICD-10-CM | POA: Diagnosis not present

## 2017-03-18 DIAGNOSIS — I252 Old myocardial infarction: Secondary | ICD-10-CM | POA: Diagnosis not present

## 2017-03-18 DIAGNOSIS — Z7901 Long term (current) use of anticoagulants: Secondary | ICD-10-CM | POA: Diagnosis not present

## 2017-03-18 DIAGNOSIS — R51 Headache: Secondary | ICD-10-CM | POA: Diagnosis not present

## 2017-03-18 DIAGNOSIS — G454 Transient global amnesia: Secondary | ICD-10-CM | POA: Diagnosis not present

## 2017-03-18 DIAGNOSIS — F419 Anxiety disorder, unspecified: Secondary | ICD-10-CM

## 2017-03-18 DIAGNOSIS — I5022 Chronic systolic (congestive) heart failure: Secondary | ICD-10-CM | POA: Insufficient documentation

## 2017-03-18 DIAGNOSIS — R079 Chest pain, unspecified: Secondary | ICD-10-CM

## 2017-03-18 DIAGNOSIS — R200 Anesthesia of skin: Secondary | ICD-10-CM | POA: Diagnosis not present

## 2017-03-18 LAB — BASIC METABOLIC PANEL
Anion gap: 10 (ref 5–15)
BUN: 17 mg/dL (ref 6–20)
CHLORIDE: 100 mmol/L — AB (ref 101–111)
CO2: 24 mmol/L (ref 22–32)
Calcium: 9.1 mg/dL (ref 8.9–10.3)
Creatinine, Ser: 1.18 mg/dL (ref 0.61–1.24)
GFR calc Af Amer: >60 mL/min (ref >60)
GFR calc non Af Amer: >60 mL/min (ref >60)
GLUCOSE: 153 mg/dL — AB (ref 65–99)
Potassium: 3.9 mmol/L (ref 3.5–5.1)
Sodium: 134 mmol/L — ABNORMAL LOW (ref 135–145)

## 2017-03-18 LAB — CBC
HEMATOCRIT: 41 % (ref 39.0–52.0)
Hemoglobin: 13.8 g/dL (ref 13.0–17.0)
MCH: 30.3 pg (ref 26.0–34.0)
MCHC: 33.7 g/dL (ref 30.0–36.0)
MCV: 90.1 fL (ref 78.0–100.0)
Platelets: 147 10*3/uL — ABNORMAL LOW (ref 150–400)
RBC: 4.55 MIL/uL (ref 4.22–5.81)
RDW: 13.4 % (ref 11.5–15.5)
WBC: 6.3 10*3/uL (ref 4.0–10.5)

## 2017-03-18 LAB — I-STAT TROPONIN, ED: Troponin i, poc: 0.01 ng/mL (ref 0.00–0.08)

## 2017-03-18 NOTE — ED Triage Notes (Signed)
Pt has been feeling weak all week. Has had several episodes of "swimming headedness" to the point that he's had to pull over while driving. Has a Sales promotion account executive defibrillator and has saw his pulse spike to 150 and 180 on his apple watch. C/o chest discomfort, denies chest pain. Pt has taken nitro x 4 with improvement of discomfort. Pt has significant cardiac history including MI, cardiac arrest, and 4 pacemaker/ defibrillators replaced.

## 2017-03-18 NOTE — ED Notes (Signed)
Interrogation completed in triage

## 2017-03-18 NOTE — ED Triage Notes (Signed)
Pt taken to xray 

## 2017-03-19 ENCOUNTER — Emergency Department (HOSPITAL_COMMUNITY): Payer: Medicare Other

## 2017-03-19 DIAGNOSIS — G454 Transient global amnesia: Secondary | ICD-10-CM | POA: Diagnosis not present

## 2017-03-19 LAB — D-DIMER, QUANTITATIVE: D-Dimer, Quant: <0.27 ug/mL-FEU (ref 0.00–0.50)

## 2017-03-19 LAB — PROTIME-INR
INR: 1.64
Prothrombin Time: 19.3 seconds — ABNORMAL HIGH (ref 11.4–15.2)

## 2017-03-19 LAB — I-STAT TROPONIN, ED: Troponin i, poc: 0.01 ng/mL (ref 0.00–0.08)

## 2017-03-19 LAB — APTT: aPTT: 37 seconds — ABNORMAL HIGH (ref 24–36)

## 2017-03-19 MED ORDER — LORAZEPAM 1 MG PO TABS: 0.5000 mg | ORAL_TABLET | Freq: Three times a day (TID) | ORAL | 0 refills | Status: DC | PRN

## 2017-03-19 MED ORDER — LORAZEPAM 2 MG/ML IJ SOLN
0.5000 mg | Freq: Once | INTRAMUSCULAR | Status: AC
  Administered 2017-03-19: 0.5 mg via INTRAVENOUS
  Filled 2017-03-19: qty 1

## 2017-03-19 NOTE — ED Notes (Signed)
Patient transported to CT 

## 2017-03-19 NOTE — ED Provider Notes (Signed)
[facility] EMERGENCY DEPARTMENT Provider Note   CSN: [id number] Arrival date & time: 03/18/17  2211/03/29     History   Chief Complaint Chief Complaint  Patient presents with  . Chest Pain    HPI [name] is a 72 y.o. male with past medical history significant for CAD, CHF, cardiomyopathy, cardiac arrest, hyperlipidemia, diabetes, A. fib, s/p pacemaker and chronic anticoagulant (Xarelto) presenting with initial complaint of chest pain which he describes as more of a slight discomfort that happened once on the left side and once on the right prior to arrival while he was on his way to his daughter's house in the car.  Patient reports that he has been experiencing head pressure squeezing like a bandanna around his head making his head feel like a mushroom.  He has also been experiencing associated tingling in all his extremities.  He has been having episodes like this approximately once a month for many months but this past week has had an increase in frequency and has had to pull over while driving because he felt his head pressure and tingling feeling like he was dozing off to sleep. Today he had multiple episodes of this and also the slight dull ache short chest discomfort.  He also reports increase shortness of breath over the past week intermittently.  Denies any focal weakness or numbness other than that tingling sensation.  Patient also reported an increase in difficulty concentrating. Denies any chest pain at this time, shortness of breath, numbness, cough, fever, chills or other symptoms.  Daughter is at bedside also providing history.  HPI  Past Medical History:  Diagnosis Date  . CALLUS, RIGHT FOOT 12/08/2009  . Cardiac arrest ([place])   . Chronic systolic heart failure ([place]) 11/03/2009  . DIABETES MELLITUS, TYPE II 12/05/2006  . 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 HYPERLIPIDEMIA 11/25/2006  . HYPOGONADISM, MALE 12/05/2006  . Implantable cardiac  defibrillator MDT 09/22/2008   Change out feb 2011, 2015  . Ischemic cardiomyopathy 09/22/2008   DES CX and RCA  70% residual LAD  Done in [place]; EF 30%;; myoviewq March 28, 2006 no ischemia  . Kidney stones   . Long term (current) use of anticoagulants 04/25/2010  . Myocardial infarction [facility]) 2004-03-28   "I died and they brought me back"  . 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 NEPHROLITHIASIS, HX OF 02/25/2008  . Persistent atrial fibrillation ([place]) 11/25/2006   Inapp shock March 28, 2010 AF VR >220 recurrent    Patient Active Problem List   Diagnosis Date Noted  . Back pain 10/11/2016  . Acute bronchitis 12/05/2014  . Encounter for well adult exam with abnormal findings 02/04/2014  . Allergic rhinitis 02/04/2014  . Insomnia 02/04/2014  . Syncope and collapse 05/15/2012  . Nephrolithiasis 04/19/2012  . Depression/anxiety 03/09/2012  . Ventricular fibrillation ([place]) 03/09/2012  . Left facial numbness 12/23/2010  . Long term current use of anticoagulant therapy 04/25/2010  . 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 CALLUS, RIGHT FOOT 12/08/2009  . SYSTOLIC HEART FAILURE, CHRONIC 11/03/2009  . CATARACT EXTRACTION, HX OF 09/22/2008  . Implantable cardiac defibrillator MDT 09/22/2008  . 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 Diabetes ([place]) 12/05/2006  . HYPOGONADISM, MALE 12/05/2006  . Hyperlipidemia 11/25/2006  . ISCHEMIC CARDIOMYOPATHY 11/25/2006  . Atrial fibrillation ([place]) 11/25/2006    Past Surgical History:  Procedure Laterality Date  . CARDIAC CATHETERIZATION     feburary March 28, 2012  . CARDIAC DEFIBRILLATOR PLACEMENT  Guidant Vitality [name]  . CARDIOVERSION N/A 04/25/2012   Procedure: CARDIOVERSION;  Surgeon: Darlin Coco, MD;  Location: [facility] ENDOSCOPY;  Service: Cardiovascular;  Laterality: N/A;  . CARDIOVERSION N/A 05/21/2012   Procedure: CARDIOVERSION;  Surgeon: Deboraha Sprang, MD;  Location: [place];  Service: Cardiovascular;  Laterality: N/A;  . CATARACT  EXTRACTION    . EYE SURGERY     cataracts  . IMPLANTABLE CARDIOVERTER DEFIBRILLATOR (ICD) GENERATOR CHANGE N/A 02/05/2014   Procedure: ICD GENERATOR CHANGE;  Surgeon: Deboraha Sprang, MD;  Location: [facility] CATH LAB;  Service: Cardiovascular;  Laterality: N/A;  . PTCA    . TONSILLECTOMY    . TONSILLECTOMY         Home Medications    Prior to Admission medications   Medication Sig Start Date End Date Taking? Authorizing Provider  Blood Glucose Monitoring Suppl (ONE TOUCH ULTRA SYSTEM KIT) W/DEVICE KIT 1 kit by Does not apply route once. 02/07/14  Yes Biagio Borg, MD  carvedilol (COREG) 12.5 MG tablet Take 1 tablet (12.5 mg total) by mouth 2 (two) times daily with a meal. 03/18/16  Yes Deboraha Sprang, MD  dofetilide (TIKOSYN) 500 MCG capsule TAKE 1 CAPSULE BY MOUTH 2 TIMES DAILY 01/02/17  Yes Deboraha Sprang, MD  fluticasone [facility]) 50 MCG/ACT nasal spray Instill 2 sprays into both nostrils daily. Patient taking differently: Instill 2 sprays into both nostrils daily as needed for allergies 12/23/14  Yes Biagio Borg, MD  furosemide (LASIX) 20 MG tablet Take 1 tablet by mouth daily. OVERDUE FOR FOLLOW UP. PLEASE CALL AND SCHEDULE 12/07/16  Yes Deboraha Sprang, MD  glucose blood (ONE TOUCH ULTRA TEST) test strip 1 each by Other route 2 (two) times daily. 12/20/16  Yes Biagio Borg, MD  Insulin Glargine (LANTUS SOLOSTAR) 100 UNIT/ML Solostar Pen Inject 40 Units every morning into the skin. And pen needles 1/day 01/12/17  Yes Renato Shin, MD  Lancets MISC Use as directed up to 4 times per day 02/07/14  Yes Biagio Borg, MD  lisinopril (PRINIVIL,ZESTRIL) 5 MG tablet Take 5 mg by mouth daily.   Yes [provider]  loratadine (CLARITIN) 10 MG tablet Take 10 mg by mouth daily.   Yes [provider]  Magnesium Oxide (MAG-OX 400 PO) Take 1 tablet by mouth 2 (two) times daily.   Yes [provider]  Multiple Vitamins-Minerals (MULTIVITAMIN PO) Take 1 tablet by mouth  daily.   Yes [provider]  NITROSTAT 0.4 MG SL tablet Place 1 tablet (0.4 mg total) under the tongue every 5 (five) minutes as needed for chest pain. 10/06/16  Yes Deboraha Sprang, MD  pioglitazone (ACTOS) 30 MG tablet Take 1 tablet (30 mg total) by mouth daily. 10/12/16  Yes Biagio Borg, MD  ranolazine (RANEXA) 500 MG 12 hr tablet Take 1 tablet (500 mg total) by mouth 2 (two) times daily. 09/08/15  Yes Deboraha Sprang, MD  sucralfate (CARAFATE) 1 g tablet Take 1 g by mouth 4 (four) times daily -  with meals and at bedtime.   Yes [provider]  tiZANidine (ZANAFLEX) 4 MG tablet Take 1 tablet (4 mg total) by mouth every 6 (six) hours as needed for muscle spasms. 10/11/16  Yes Biagio Borg, MD  XARELTO 20 MG TABS tablet TAKE 1 TABLET BY MOUTH EVERY DAY 12/05/16  Yes Deboraha Sprang, MD  zolpidem (AMBIEN) 5 MG tablet TAKE 1 OR 2 TABLETS BY MOUTH AT BEDTIME AS  NEEDED Patient taking differently: TAKE 1 OR 2 TABLETS BY MOUTH AT BEDTIME AS NEEDED FOR SLEEP 11/08/16  Yes Biagio Borg, MD  LORazepam (ATIVAN) 1 MG tablet Take 0.5 tablets (0.5 mg total) by mouth every 8 (eight) hours as needed for anxiety. 03/19/17   Emeline General, PA-C    Family History Family History  Problem Relation Age of Onset  . Diabetes Maternal Grandmother   . Hyperlipidemia Maternal Grandmother   . Breast cancer Mother   . Cancer Neg Hx   . COPD Neg Hx   . Heart disease Neg Hx     Social History Social History   Tobacco Use  . Smoking status: Never Smoker  . Smokeless tobacco: Never Used  Substance Use Topics  . Alcohol use: Yes    Alcohol/week: 0.0 oz    Comment: summer   . Drug use: No     Allergies   Salmon [fish allergy] and Metformin and related   Review of Systems Review of Systems  Constitutional: Positive for fatigue. Negative for chills and fever.  HENT: Negative for congestion, facial swelling, tinnitus and trouble swallowing.   Respiratory: Negative for cough, choking,  chest tightness, shortness of breath, wheezing and stridor.   Cardiovascular: Negative for chest pain, palpitations and leg swelling.       Mild left-sided chest discomfort dull ache for a few seconds and then right-sided chest discomfort for a few seconds prior to arrival.  Now resolved  Gastrointestinal: Negative for abdominal distention, abdominal pain, nausea and vomiting.  Genitourinary: Negative for difficulty urinating, dysuria, flank pain, frequency and hematuria.  Musculoskeletal: Negative for arthralgias, gait problem, joint swelling, myalgias, neck pain and neck stiffness.  Skin: Negative for color change, pallor and rash.  Neurological: Positive for numbness. Negative for dizziness, syncope, facial asymmetry, speech difficulty, weakness, light-headedness and headaches.       He denies any headache but reports head pressure.  Tingling sensations in all distal extremities intermittently.  Not at this time.     Physical Exam Updated Vital Signs BP 115/71   Pulse 68   Temp 98.5 F (36.9 C)   Resp 14   SpO2 93%   Physical Exam  Constitutional: He is oriented to person, place, and time. He appears well-developed and well-nourished.  Non-toxic appearance. He does not appear ill. No distress.  Afebrile, nontoxic-appearing, lying comfortably in bed no acute distress.  HENT:  Head: Normocephalic and atraumatic.  Eyes: Conjunctivae and EOM are normal. Pupils are equal, round, and reactive to light.  Neck: Normal range of motion. Neck supple.  Cardiovascular: Normal rate, regular rhythm, intact distal pulses and normal pulses.  No murmur heard. Pulmonary/Chest: Effort normal and breath sounds normal. No accessory muscle usage or stridor. No respiratory distress. He has no decreased breath sounds.  Abdominal: Soft. He exhibits no distension. There is no tenderness. There is no guarding.  Musculoskeletal: Normal range of motion. He exhibits no edema.       Right lower leg: Normal. He  exhibits no tenderness and no edema.       Left lower leg: Normal. He exhibits no tenderness and no edema.  Neurological: He is alert and oriented to person, place, and time. He is not disoriented. No cranial nerve deficit.  Neurologic Exam:  - Mental status: Patient is alert and cooperative. Fluent speech and words are clear. Coherent thought processes and insight is good. Patient is oriented x 4 to person, place, time and event.  - Cranial  nerves:  CN III, IV, VI: pupils equally round, reactive to light both direct and conscensual. Full extra-ocular movement. CN VII : muscles of facial expression intact. CN X :  midline uvula. XI strength of sternocleidomastoid and trapezius muscles 5/5, XII: tongue is midline when protruded. - Motor: No involuntary movements. Muscle tone and bulk normal throughout. Muscle strength is 5/5 in bilateral shoulder abduction, elbow flexion and extension, grip, hip extension, flexion, leg flexion and extension, ankle dorsiflexion and plantar flexion.  - Sensory: light tough sensation intact in all extremities.  - Cerebellar: rapid alternating movements and point to point movement intact in upper and lower extremities. Normal stance and gait.  Skin: Skin is warm and dry. No rash noted. He is not diaphoretic. No erythema. No pallor. Clubbing:   Psychiatric: He has a normal mood and affect.  Nursing note and vitals reviewed.    ED Treatments / Results  Labs (all labs ordered are listed, but only abnormal results are displayed) Labs Reviewed  BASIC METABOLIC PANEL - Abnormal; Notable for the following components:      Result Value   Sodium 134 (*)    Chloride 100 (*)    Glucose, Bld 153 (*)    All other components within normal limits  CBC - Abnormal; Notable for the following components:   Platelets 147 (*)    All other components within normal limits  APTT - Abnormal; Notable for the following components:   aPTT 37 (*)    All other components within normal  limits  PROTIME-INR - Abnormal; Notable for the following components:   Prothrombin Time 19.3 (*)    All other components within normal limits  D-DIMER, QUANTITATIVE (NOT AT [facility])  I-STAT TROPONIN, ED  I-STAT TROPONIN, ED    EKG  EKG Interpretation  Date/Time:  Saturday March 18 2017 22:19:07 EST Ventricular Rate:  73 PR Interval:  168 QRS Duration: 172 QT Interval:  494 QTC Calculation: 544 R Axis:   -69 Text Interpretation:  Atrial-sensed ventricular-paced rhythm Abnormal ECG Confirmed by Pryor Curia [phone]) on 03/18/2017 11:32:59 PM       Radiology Dg Chest 2 View  Result Date: 03/18/2017 CLINICAL DATA:  Weakness and tachycardia.  Lightheadedness. EXAM: CHEST  2 VIEW COMPARISON:  Chest radiograph 10/15/2015 FINDINGS: Unchanged left atrial appendage clip. Left chest wall AICD with 3 leads in expected positions. Lungs are clear. Normal cardiomediastinal silhouette. Multilevel thoracic osteophytosis. IMPRESSION: No active cardiopulmonary disease. Electronically Signed   By: Ulyses Jarred M.D.   On: 03/18/2017 23:12   Ct Head Wo Contrast  Result Date: 03/19/2017 CLINICAL DATA:  72 year old male with weakness.  Concern for TIA. EXAM: CT HEAD WITHOUT CONTRAST TECHNIQUE: Contiguous axial images were obtained from the base of the skull through the vertex without intravenous contrast. COMPARISON:  Head CT dated 12/24/2015 FINDINGS: Brain: The ventricles and sulci appropriate size for patient's age. The gray-white matter discrimination is preserved. There is no acute intracranial hemorrhage. No mass effect or midline shift. No extra-axial fluid collection. Vascular: No hyperdense vessel or unexpected calcification. Skull: Normal. Negative for fracture or focal lesion. Sinuses/Orbits: Mild left mastoid effusion. The right mastoid air cells are clear. There is minimal mucoperiosteal thickening of paranasal sinuses. No air-fluid level. Bilateral cataract surgeries. Other: None IMPRESSION:  Unremarkable noncontrast CT of the brain. Electronically Signed   By: Anner Crete M.D.   On: 03/19/2017 00:32    Procedures Procedures (including critical care time)  Medications Ordered in ED Medications  LORazepam (ATIVAN)  injection 0.5 mg (0.5 mg Intravenous Given 03/19/17 0411)     Initial Impression / Assessment and Plan / ED Course  I have reviewed the triage vital signs and the nursing notes.  Pertinent labs & imaging results that were available during my care of the patient were reviewed by me and considered in my medical decision making (see chart for details).    Patient with significant past medical history presenting with 1 week of increased frequency and head pressure, sleepiness, tingling sensation in all extremities, and a brief episode of chest discomfort that moved from the left to the right and resolved prior to arrival.  Labs unremarkable, CT head negative, EKG paced rhythm. Delta troponin negative. Patient has been chest pain free since short episode PTA.  Pacemaker was interrogated and last abnormal rhythm recorded was on December 12th 2018. Small amount of fluid noted, otherwise operating as expected. Chest xray without evidence of pulmonary edema or pneumonia.  Patient later had anxiety episode and reported feeling cold in his extremities more on the left side and hyperventilating.  Repeat gross neuro exam normal.  Patient had been placed on [place] by nursing. sats 100% on room air. I removed the [place] and provided reassurance.  Will give 0.5 ativan and reassess.  On reassessment, patient reported significant improvement.  Patient was discussed with Dr. Leonides Schanz who has seen patient and agrees with assessment and plan.  Will Dc home with short course ativan and close PCP and cardiology follow up.  Discussed strict return precautions and advised to return to the emergency department if experiencing any new or worsening symptoms. Instructions were understood and  patient agreed with discharge plan.  Final Clinical Impressions(s) / ED Diagnoses   Final diagnoses:  Nonspecific chest pain  Anxiety  Pressure in head  Tingling of both upper extremities    ED Discharge Orders        Ordered    LORazepam (ATIVAN) 1 MG tablet  Every 8 hours PRN     03/19/17 0512       Emeline General, PA-C 03/19/17 [place], Delice Bison, DO 03/19/17 [phone]

## 2017-03-19 NOTE — Discharge Instructions (Signed)
As discussed, stay well-hydrated take 0.5 ativan as needed every 8 hours for anxiety. Follow up with your primary care provider and cardiologist this week.  Return if symptoms worsen, chest pain, difficulty breathing, increased shortness of breath, fever, chills, weakness in one extremity, or any other new concerning symptoms in the

## 2017-03-20 ENCOUNTER — Telehealth: Payer: Self-pay | Admitting: Endocrinology

## 2017-03-20 MED ORDER — HUMALOG JUNIOR KWIKPEN 100 UNIT/ML ~~LOC~~ SOPN: 5.0000 [IU] | PEN_INJECTOR | Freq: Three times a day (TID) | SUBCUTANEOUS | 11 refills | Status: DC

## 2017-03-20 MED ORDER — INSULIN GLARGINE 100 UNIT/ML SOLOSTAR PEN: 25.0000 [IU] | PEN_INJECTOR | SUBCUTANEOUS | 3 refills | Status: DC

## 2017-03-20 NOTE — Telephone Encounter (Signed)
Patient stated he would to take the Lantus according to how [place] want to take it. please advise

## 2017-03-20 NOTE — Telephone Encounter (Signed)
Ok, please reduce the lantus to 25 units qd, and: Add humalog to 5 units 3 times a day (just before each meal) I'll see you next time.

## 2017-03-20 NOTE — Telephone Encounter (Signed)
Patient stated that he would like to have better control by doing a meal time insulin as you guys discussed at his last visit. I told him that I would message you to ask if that was still something you felt would benefit him. I stated I'd call back with any changes & dosages of insulin. Please advise?

## 2017-03-21 ENCOUNTER — Other Ambulatory Visit: Payer: Self-pay

## 2017-03-21 MED ORDER — INSULIN LISPRO 100 UNIT/ML (KWIKPEN): PEN_INJECTOR | SUBCUTANEOUS | 11 refills | Status: DC

## 2017-03-21 NOTE — Telephone Encounter (Signed)
Called pharmacy - verified.

## 2017-03-21 NOTE — Telephone Encounter (Signed)
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   Verify dosage also    Please advise [phone]

## 2017-03-22 ENCOUNTER — Other Ambulatory Visit: Payer: Self-pay

## 2017-03-22 ENCOUNTER — Telehealth: Payer: Self-pay | Admitting: Endocrinology

## 2017-03-22 ENCOUNTER — Ambulatory Visit: Payer: Medicare Other | Admitting: Internal Medicine

## 2017-03-22 ENCOUNTER — Encounter: Payer: Self-pay | Admitting: Internal Medicine

## 2017-03-22 VITALS — BP 116/72 | HR 62 | Temp 97.6°F | Ht 70.0 in | Wt 195.0 lb

## 2017-03-22 DIAGNOSIS — E785 Hyperlipidemia, unspecified: Secondary | ICD-10-CM

## 2017-03-22 DIAGNOSIS — E0842 Diabetes mellitus due to underlying condition with diabetic polyneuropathy: Secondary | ICD-10-CM | POA: Diagnosis not present

## 2017-03-22 DIAGNOSIS — F419 Anxiety disorder, unspecified: Secondary | ICD-10-CM | POA: Diagnosis not present

## 2017-03-22 MED ORDER — CITALOPRAM HYDROBROMIDE 10 MG PO TABS: 10.0000 mg | ORAL_TABLET | Freq: Every day | ORAL | 3 refills | Status: DC

## 2017-03-22 MED ORDER — LORAZEPAM 1 MG PO TABS: 0.5000 mg | ORAL_TABLET | Freq: Every day | ORAL | 1 refills | Status: DC | PRN

## 2017-03-22 NOTE — Patient Instructions (Addendum)
Please take all new medication as prescribed - the celexa 10 mg per day  Please call in 1 month if you feel you need the higher dose at 20 mg  Please continue all other medications as before, and refills have been done if requested - the ativan (lorazepam)  Please have the pharmacy call with any other refills you may need.  Please continue your efforts at being more active, low cholesterol diet, and weight control..  Please keep your appointments with your specialists as you may have planned - Cardiology next wk  No further lab work needed today  Please return about Aug 2019, or sooner if needed, with Lab testing done 3-5 days before

## 2017-03-22 NOTE — Telephone Encounter (Signed)
Called and verified with pharmacy.

## 2017-03-22 NOTE — Progress Notes (Signed)
Subjective:    Patient ID: [name], male    DOB: [birth year], 72 y.o.   MRN: [id number]  HPI Patient is a 72 year old male with history of CAD, CHF, diabetes, hyperlipidemia, atrial fibrillation on Xarelto, pacemaker , recently seen in ED with CP and head pressure, Felt improved with IV ativan.  CT head neg, and CP evaluation neg for ACS.  Had some hands tingling and numbness to distal legs as well along with the head pressure.  Had to pull over twice last wk in the car due to dizziness and lightheaded, and stated driving again when improved in a few minutes.  Has an apple watch and app to follow his heart rate which has been averaging 66-87 but has several spikes between 140 and 200 per pt.  He believes that his HR elevation and dizziness are related temporally, and assoc with dyspnea and feeling cold.   To see cardiologist next Tuesday.  Given small course of ativan for home from ED, has only taken half every am instead of half every 8 hrs as prescribed.   Would like to continue low dose ativan, but ok for celexa start as well.  Declines need for counseling.  No other complaints or interval hx Past Medical History:  Diagnosis Date  . CALLUS, RIGHT FOOT 12/08/2009  . Cardiac arrest ([place])   . Chronic systolic heart failure ([employer]) 11/03/2009  . DIABETES MELLITUS, TYPE II 12/05/2006  . 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 HYPERLIPIDEMIA 11/25/2006  . HYPOGONADISM, MALE 12/05/2006  . Implantable cardiac defibrillator MDT 09/22/2008   Change out feb 2011, 2015  . Ischemic cardiomyopathy 09/22/2008   DES CX and RCA  70% residual LAD  Done in [place]; EF 30%;; myoviewq May 10, 2006 no ischemia  . Kidney stones   . Long term (current) use of anticoagulants 04/25/2010  . Myocardial infarction [facility]) 2004-05-10   "I died and they brought me back"  . 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 NEPHROLITHIASIS, HX OF 02/25/2008  . Persistent atrial fibrillation ([place]) 11/25/2006   Inapp shock 10-May-2010 AF VR >220  recurrent   Past Surgical History:  Procedure Laterality Date  . CARDIAC CATHETERIZATION     feburary 05-10-2012  . CARDIAC DEFIBRILLATOR PLACEMENT     Guidant Vitality [name]  . CARDIOVERSION N/A 04/25/2012   Procedure: CARDIOVERSION;  Surgeon: Darlin Coco, MD;  Location: [facility] ENDOSCOPY;  Service: Cardiovascular;  Laterality: N/A;  . CARDIOVERSION N/A 05/21/2012   Procedure: CARDIOVERSION;  Surgeon: Deboraha Sprang, MD;  Location: [place];  Service: Cardiovascular;  Laterality: N/A;  . CATARACT EXTRACTION    . EYE SURGERY     cataracts  . IMPLANTABLE CARDIOVERTER DEFIBRILLATOR (ICD) GENERATOR CHANGE N/A 02/05/2014   Procedure: ICD GENERATOR CHANGE;  Surgeon: Deboraha Sprang, MD;  Location[facility] C[facility] LAB;  Service: Cardiovascular;  Laterality: N/A;  . PTCA    . TONSILLECTOMY    . TONSILLECTOMY      reports that  has never smoked. he has never used smokeless tobacco. He reports that he drinks alcohol. He reports that he does not use drugs. family history includes Breast cancer in his mother; Diabetes in his maternal grandmother; Hyperlipidemia in his maternal grandmother. Allergies  Allergen Reactions  . Salmon [Fish Allergy] Other (See Comments)    Only canned/ not fresh (probably preservatives).   . Metformin And Related Other (See Comments)    diarrhea   Current Outpatient Medications on File Prior to Visit  Medication Sig Dispense  Refill  . Blood Glucose Monitoring Suppl (ONE TOUCH ULTRA SYSTEM KIT) W/DEVICE KIT 1 kit by Does not apply route once. 1 each 0  . carvedilol (COREG) 12.5 MG tablet Take 1 tablet (12.5 mg total) by mouth 2 (two) times daily with a meal. 180 tablet 5  . dofetilide (TIKOSYN) 500 MCG capsule TAKE 1 CAPSULE BY MOUTH 2 TIMES DAILY 60 capsule 0  . fluticasone (FLONASE) 50 MCG/ACT nasal spray Instill 2 sprays into both nostrils daily. (Patient taking differently: Instill 2 sprays into both nostrils daily as needed for allergies) 16 g 5  . furosemide (LASIX) 20 MG  tablet Take 1 tablet by mouth daily. OVERDUE FOR FOLLOW UP. PLEASE CALL AND SCHEDULE 15 tablet 0  . glucose blood (ONE TOUCH ULTRA TEST) test strip 1 each by Other route 2 (two) times daily. 200 each 2  . Insulin Glargine (LANTUS SOLOSTAR) 100 UNIT/ML Solostar Pen Inject 25 Units into the skin every morning. And pen needles 1/day 15 mL 3  . insulin lispro (HUMALOG KWIKPEN) 100 UNIT/ML KiwkPen Inject 5 units into the skin three times daily with meals. 15 mL 11  . Lancets MISC Use as directed up to 4 times per day 100 each 11  . lisinopril (PRINIVIL,ZESTRIL) 5 MG tablet Take 5 mg by mouth daily.    Marland Kitchen loratadine (CLARITIN) 10 MG tablet Take 10 mg by mouth daily.    . Magnesium Oxide (MAG-OX 400 PO) Take 1 tablet by mouth 2 (two) times daily.    . Multiple Vitamins-Minerals (MULTIVITAMIN PO) Take 1 tablet by mouth daily.    Marland Kitchen NITROSTAT 0.4 MG SL tablet Place 1 tablet (0.4 mg total) under the tongue every 5 (five) minutes as needed for chest pain. 25 tablet 5  . pioglitazone (ACTOS) 30 MG tablet Take 1 tablet (30 mg total) by mouth daily. 90 tablet 3  . ranolazine (RANEXA) 500 MG 12 hr tablet Take 1 tablet (500 mg total) by mouth 2 (two) times daily. 60 tablet 6  . sucralfate (CARAFATE) 1 g tablet Take 1 g by mouth 4 (four) times daily -  with meals and at bedtime.    Marland Kitchen tiZANidine (ZANAFLEX) 4 MG tablet Take 1 tablet (4 mg total) by mouth every 6 (six) hours as needed for muscle spasms. 30 tablet 0  . XARELTO 20 MG TABS tablet TAKE 1 TABLET BY MOUTH EVERY DAY 30 tablet 5  . zolpidem (AMBIEN) 5 MG tablet TAKE 1 OR 2 TABLETS BY MOUTH AT BEDTIME AS NEEDED (Patient taking differently: TAKE 1 OR 2 TABLETS BY MOUTH AT BEDTIME AS NEEDED FOR SLEEP) 180 tablet 1   No current facility-administered medications on file prior to visit.    Review of Systems  Constitutional: Negative for other unusual diaphoresis or sweats HENT: Negative for ear discharge or swelling Eyes: Negative for other worsening visual  disturbances Respiratory: Negative for stridor or other swelling  Gastrointestinal: Negative for worsening distension or other blood Genitourinary: Negative for retention or other urinary change Musculoskeletal: Negative for other MSK pain or swelling Skin: Negative for color change or other new lesions Neurological: Negative for worsening tremors and other numbness  Psychiatric/Behavioral: Negative for worsening agitation or other fatigue All other system neg per pt    Objective:   Physical Exam BP 116/72   Pulse 62   Temp 97.6 F (36.4 C) (Oral)   Ht '5\' 10"'  (1.778 m)   Wt 195 lb (88.5 kg)   SpO2 99%   BMI 27.98 kg/m  VS noted,  Constitutional: Pt appears in NAD HENT: Head: NCAT.  Right Ear: External ear normal.  Left Ear: External ear normal.  Eyes: . Pupils are equal, round, and reactive to light. Conjunctivae and EOM are normal Nose: without d/c or deformity Neck: Neck supple. Gross normal ROM Cardiovascular: Normal rate and regular rhythm.   Pulmonary/Chest: Effort normal and breath sounds without rales or wheezing.  Abd:  Soft, NT, ND, + BS, no organomegaly Neurological: Pt is alert. At baseline orientation, motor grossly intact Skin: Skin is warm. No rashes, other new lesions, no LE edema Psychiatric: Pt behavior is normal without agitation , 1-2+ nervous No other exam findings Lab Results  Component Value Date   WBC 6.3 03/18/2017   HGB 13.8 03/18/2017   HCT 41.0 03/18/2017   PLT 147 (L) 03/18/2017   GLUCOSE 153 (H) 03/18/2017   CHOL 179 10/12/2016   TRIG 319.0 (H) 10/12/2016   HDL 33.30 (L) 10/12/2016   LDLDIRECT 100.0 10/12/2016   LDLCALC 91 02/11/2015   ALT 20 10/12/2016   AST 17 10/12/2016   NA 134 (L) 03/18/2017   K 3.9 03/18/2017   CL 100 (L) 03/18/2017   CREATININE 1.18 03/18/2017   BUN 17 03/18/2017   CO2 24 03/18/2017   TSH 6.02 (H) 10/12/2016   PSA 0.41 10/12/2016   INR 1.64 03/18/2017   HGBA1C 7.9 03/03/2017   MICROALBUR 4.8 (H)  10/12/2016       Assessment & Plan:

## 2017-03-22 NOTE — Telephone Encounter (Signed)
[facility] [phone] need to verify rx for this patient. Please advise

## 2017-03-26 ENCOUNTER — Encounter: Payer: Self-pay | Admitting: Internal Medicine

## 2017-03-26 NOTE — Assessment & Plan Note (Signed)
Mild uncontrolled, declines med change for now, for wt control, diet, cont same tx

## 2017-03-26 NOTE — Assessment & Plan Note (Signed)
Mild to mod, declines referral for counseling, for celexa 10 qd, ativan prn,  to f/u any worsening symptoms or concerns

## 2017-03-26 NOTE — Assessment & Plan Note (Signed)
Lab Results  Component Value Date   LDLCALC 91 02/11/2015  declines f/u lab today, goal ldl < 70

## 2017-03-27 ENCOUNTER — Other Ambulatory Visit: Payer: Self-pay | Admitting: Internal Medicine

## 2017-03-28 DIAGNOSIS — I5020 Unspecified systolic (congestive) heart failure: Secondary | ICD-10-CM | POA: Diagnosis not present

## 2017-03-28 DIAGNOSIS — I4901 Ventricular fibrillation: Secondary | ICD-10-CM | POA: Diagnosis not present

## 2017-03-28 DIAGNOSIS — R42 Dizziness and giddiness: Secondary | ICD-10-CM | POA: Diagnosis not present

## 2017-03-28 DIAGNOSIS — R0789 Other chest pain: Secondary | ICD-10-CM | POA: Diagnosis not present

## 2017-03-28 DIAGNOSIS — I2510 Atherosclerotic heart disease of native coronary artery without angina pectoris: Secondary | ICD-10-CM | POA: Diagnosis not present

## 2017-03-28 DIAGNOSIS — I480 Paroxysmal atrial fibrillation: Secondary | ICD-10-CM | POA: Diagnosis not present

## 2017-03-28 DIAGNOSIS — R202 Paresthesia of skin: Secondary | ICD-10-CM | POA: Diagnosis not present

## 2017-03-28 DIAGNOSIS — I110 Hypertensive heart disease with heart failure: Secondary | ICD-10-CM | POA: Diagnosis not present

## 2017-03-28 DIAGNOSIS — I493 Ventricular premature depolarization: Secondary | ICD-10-CM | POA: Diagnosis not present

## 2017-03-28 DIAGNOSIS — R29818 Other symptoms and signs involving the nervous system: Secondary | ICD-10-CM | POA: Diagnosis not present

## 2017-03-28 DIAGNOSIS — R200 Anesthesia of skin: Secondary | ICD-10-CM | POA: Diagnosis not present

## 2017-03-28 DIAGNOSIS — I4581 Long QT syndrome: Secondary | ICD-10-CM | POA: Diagnosis not present

## 2017-03-28 DIAGNOSIS — I4891 Unspecified atrial fibrillation: Secondary | ICD-10-CM | POA: Diagnosis not present

## 2017-03-28 DIAGNOSIS — I255 Ischemic cardiomyopathy: Secondary | ICD-10-CM | POA: Diagnosis not present

## 2017-03-28 DIAGNOSIS — Z6827 Body mass index (BMI) 27.0-27.9, adult: Secondary | ICD-10-CM | POA: Diagnosis not present

## 2017-03-28 DIAGNOSIS — I252 Old myocardial infarction: Secondary | ICD-10-CM | POA: Diagnosis not present

## 2017-03-28 DIAGNOSIS — Z4502 Encounter for adjustment and management of automatic implantable cardiac defibrillator: Secondary | ICD-10-CM | POA: Diagnosis not present

## 2017-03-28 DIAGNOSIS — Z8674 Personal history of sudden cardiac arrest: Secondary | ICD-10-CM | POA: Diagnosis not present

## 2017-03-31 ENCOUNTER — Telehealth: Payer: Self-pay | Admitting: Internal Medicine

## 2017-03-31 NOTE — Telephone Encounter (Signed)
Copied from [place]. Topic: Quick Communication - See Telephone Encounter >> Mar 31, 2017  9:08 AM Arletha Grippe wrote: CRM for notification. See Telephone encounter for:   03/31/17. [employer] called to let us know that PA for LORazepam (ATIVAN) 1 MG tablet has been approved. It is good for 1 year  No cb needed

## 2017-04-24 ENCOUNTER — Other Ambulatory Visit: Payer: Self-pay | Admitting: Internal Medicine

## 2017-05-01 ENCOUNTER — Encounter: Payer: Self-pay | Admitting: Endocrinology

## 2017-05-01 ENCOUNTER — Ambulatory Visit: Payer: Medicare Other | Admitting: Endocrinology

## 2017-05-01 VITALS — BP 132/66 | HR 60 | Temp 98.2°F | Wt 195.0 lb

## 2017-05-01 DIAGNOSIS — Z794 Long term (current) use of insulin: Secondary | ICD-10-CM

## 2017-05-01 DIAGNOSIS — E0842 Diabetes mellitus due to underlying condition with diabetic polyneuropathy: Secondary | ICD-10-CM | POA: Diagnosis not present

## 2017-05-01 LAB — POCT GLYCOSYLATED HEMOGLOBIN (HGB A1C): HEMOGLOBIN A1C: 7.1

## 2017-05-01 NOTE — Patient Instructions (Addendum)
check your blood sugar twice a day.  vary the time of day when you check, between before the 3 meals, and at bedtime.  also check if you have symptoms of your blood sugar being too high or too low.  please keep a record of the readings and bring it to your next appointment here (or you can bring the meter itself).  You can write it on any piece of paper.  please call us sooner if your blood sugar goes below 70, or if you have a lot of readings over 200.   Please continue the same medications for now.   Please come back for a follow-up appointment in 3-4 months.

## 2017-05-01 NOTE — Progress Notes (Signed)
Subjective:    Patient ID: [name], male    DOB: April 28, 1945, 72 y.o.   MRN: [id number]  HPI Pt returns for f/u of diabetes mellitus: DM type: Insulin-requiring type 2 Dx'ed: 7062 Complications: polyneuropathy and CAD Therapy: insulin since mid-2018, and pioglitizone.  DKA: never Severe hypoglycemia: never.   Pancreatitis: never.  Pancreatic imaging: never Other: He did not tolerate metformin (diarrhea); he declines V-GO, due to cost.  He takes multiple daily injections.   Interval history: he brings a record of his cbg's which I have reviewed today.  It varies from 100-170.  He says he never misses the insulin.   Past Medical History:  Diagnosis Date  . CALLUS, RIGHT FOOT 12/08/2009  . Cardiac arrest ([place])   . Chronic systolic heart failure ([place]) 11/03/2009  . DIABETES MELLITUS, TYPE II 12/05/2006  . 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 HYPERLIPIDEMIA 11/25/2006  . HYPOGONADISM, MALE 12/05/2006  . Implantable cardiac defibrillator MDT 09/22/2008   Change out feb 2011, 2015  . Ischemic cardiomyopathy 09/22/2008   DES CX and RCA  70% residual LAD  Done in [place]; EF 30%;; myoviewq 15-Jun-2006 no ischemia  . Kidney stones   . Long term (current) use of anticoagulants 04/25/2010  . Myocardial infarction [facility]) 06-14-2004   "I died and they brought me back"  . 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 NEPHROLITHIASIS, HX OF 02/25/2008  . Persistent atrial fibrillation ([place]) 11/25/2006   Inapp shock 06/15/10 AF VR >220 recurrent    Past Surgical History:  Procedure Laterality Date  . CARDIAC CATHETERIZATION     feburary 06/14/12  . CARDIAC DEFIBRILLATOR PLACEMENT     Guidant Vitality [name]  . CARDIOVERSION N/A 04/25/2012   Procedure: CARDIOVERSION;  Surgeon: Darlin Coco, MD;  Location: [facility] ENDOSCOPY;  Service: Cardiovascular;  Laterality: N/A;  . CARDIOVERSION N/A 05/21/2012   Procedure: CARDIOVERSION;  Surgeon: Deboraha Sprang, MD;  Location: [place];  Service: Cardiovascular;   Laterality: N/A;  . CATARACT EXTRACTION    . EYE SURGERY     cataracts  . IMPLANTABLE CARDIOVERTER DEFIBRILLATOR (ICD) GENERATOR CHANGE N/A 02/05/2014   Procedure: ICD GENERATOR CHANGE;  Surgeon: Deboraha Sprang, MD;  Location[facility] C[facility] LAB;  Service: Cardiovascular;  Laterality: N/A;  . PTCA    . TONSILLECTOMY    . TONSILLECTOMY      Social History   Socioeconomic History  . Marital status: Divorced    Spouse name: Not on file  . Number of children: 1  . Years of education: 24  . Highest education level: Not on file  Social Needs  . Financial resource strain: Not on file  . Food insecurity - worry: Not on file  . Food insecurity - inability: Not on file  . Transportation needs - medical: Not on file  . Transportation needs - non-medical: Not on file  Occupational History  . 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 Smoking status: Never Smoker  . Smokeless tobacco: Never Used  Substance and Sexual Activity  . Alcohol use: Yes    Alcohol/week: 0.0 oz    Comment: summer   . Drug use: No  . Sexual activity: Yes    Partners: Female  Other Topics Concern  . Not on file  Social History Narrative   HSG, College grad. Married - divorced. 1 daughter. owner/operator Research officer, trade union business and show production    Current  Outpatient Medications on File Prior to Visit  Medication Sig Dispense Refill  . carvedilol (COREG) 12.5 MG tablet Take 1 tablet (12.5 mg total) by mouth 2 (two) times daily with a meal. 180 tablet 5  . citalopram (CELEXA) 10 MG tablet Take 1 tablet (10 mg total) by mouth daily. 90 tablet 3  . dofetilide (TIKOSYN) 500 MCG capsule TAKE 1 CAPSULE BY MOUTH 2 TIMES DAILY 60 capsule 0  . fluticasone (FLONASE) 50 MCG/ACT nasal spray Instill 2 sprays into both nostrils daily. (Patient taking differently: Instill 2 sprays into both nostrils daily as needed for allergies) 16 g 5  . furosemide (LASIX) 20  MG tablet Take 1 tablet by mouth daily. OVERDUE FOR FOLLOW UP. PLEASE CALL AND SCHEDULE 15 tablet 0  . Glucose Blood (FREESTYLE PRECISION NEO TEST VI) by In Vitro route.    Marland Kitchen glucose blood (ONE TOUCH ULTRA TEST) test strip 1 each by Other route 2 (two) times daily. 200 each 2  . Insulin Glargine (LANTUS SOLOSTAR) 100 UNIT/ML Solostar Pen Inject 25 Units into the skin every morning. And pen needles 1/day 15 mL 3  . insulin lispro (HUMALOG KWIKPEN) 100 UNIT/ML KiwkPen Inject 5 units into the skin three times daily with meals. 15 mL 11  . Lancets MISC Use as directed up to 4 times per day 100 each 11  . lisinopril (PRINIVIL,ZESTRIL) 5 MG tablet Take 5 mg by mouth daily.    Marland Kitchen loratadine (CLARITIN) 10 MG tablet Take 10 mg by mouth daily.    Marland Kitchen LORazepam (ATIVAN) 1 MG tablet Take 0.5 tablets (0.5 mg total) by mouth daily as needed for anxiety. 45 tablet 1  . Magnesium Oxide (MAG-OX 400 PO) Take 1 tablet by mouth 2 (two) times daily.    . Multiple Vitamins-Minerals (MULTIVITAMIN PO) Take 1 tablet by mouth daily.    Marland Kitchen NITROSTAT 0.4 MG SL tablet Place 1 tablet (0.4 mg total) under the tongue every 5 (five) minutes as needed for chest pain. 25 tablet 5  . pioglitazone (ACTOS) 30 MG tablet Take 1 tablet (30 mg total) by mouth daily. 90 tablet 3  . sucralfate (CARAFATE) 1 g tablet Take 1 g by mouth 4 (four) times daily -  with meals and at bedtime.    Alveda Reasons 20 MG TABS tablet TAKE 1 TABLET BY MOUTH EVERY DAY 30 tablet 5  . zolpidem (AMBIEN) 5 MG tablet TAKE 1 OR 2 TABLETS BY MOUTH AT BEDTIME AS NEEDED (Patient taking differently: TAKE 1 OR 2 TABLETS BY MOUTH AT BEDTIME AS NEEDED FOR SLEEP) 180 tablet 1  . ranolazine (RANEXA) 500 MG 12 hr tablet Take 1 tablet (500 mg total) by mouth 2 (two) times daily. 60 tablet 6   No current facility-administered medications on file prior to visit.     Allergies  Allergen Reactions  . Salmon [Fish Allergy] Other (See Comments)    Only canned/ not fresh (probably  preservatives).   . Metformin And Related Other (See Comments)    diarrhea    Family History  Problem Relation Age of Onset  . Diabetes Maternal Grandmother   . Hyperlipidemia Maternal Grandmother   . Breast cancer Mother   . Cancer Neg Hx   . COPD Neg Hx   . Heart disease Neg Hx     BP 132/66 (BP Location: Left Arm, Patient Position: Sitting, Cuff Size: Large)   Pulse 60   Temp 98.2 F (36.8 C) (Oral)   Wt 195 lb (88.5 kg)  BMI 27.98 kg/m    Review of Systems He denies hypoglycemia.      Objective:   Physical Exam VITAL SIGNS:  See vs page GENERAL: no distress Pulses: dorsalis pedis intact bilat.   MSK: no deformity of the feet CV: no leg edema Skin:  no ulcer on the feet.  normal color and temp on the feet. Neuro: sensation is intact to touch on the feet   Lab Results  Component Value Date   HGBA1C 7.1 05/01/2017       Assessment & Plan:  Insulin-requiring type 2 DM, with polyneuropathy: well-controlled.  CAD: he needs to continue to avoid hypoglycemia  Patient Instructions  check your blood sugar twice a day.  vary the time of day when you check, between before the 3 meals, and at bedtime.  also check if you have symptoms of your blood sugar being too high or too low.  please keep a record of the readings and bring it to your next appointment here (or you can bring the meter itself).  You can write it on any piece of paper.  please call us sooner if your blood sugar goes below 70, or if you have a lot of readings over 200.   Please continue the same medications for now.   Please come back for a follow-up appointment in 3-4 months.

## 2017-05-12 DIAGNOSIS — E119 Type 2 diabetes mellitus without complications: Secondary | ICD-10-CM | POA: Diagnosis not present

## 2017-05-12 LAB — HM DIABETES EYE EXAM

## 2017-05-22 ENCOUNTER — Other Ambulatory Visit: Payer: Self-pay | Admitting: Internal Medicine

## 2017-05-24 ENCOUNTER — Other Ambulatory Visit: Payer: Self-pay | Admitting: Internal Medicine

## 2017-05-24 NOTE — Telephone Encounter (Signed)
[name] too soon, as was just done mar 25

## 2017-05-24 NOTE — Telephone Encounter (Signed)
Routing to dr john, please advise, thanks 

## 2017-05-26 ENCOUNTER — Other Ambulatory Visit: Payer: Self-pay | Admitting: Internal Medicine

## 2017-05-26 NOTE — Telephone Encounter (Signed)
Last filled 04/27/2017 60#

## 2017-05-26 NOTE — Telephone Encounter (Signed)
ambien refill too soon, as last rx was just mar 25

## 2017-05-31 ENCOUNTER — Telehealth: Payer: Self-pay | Admitting: Endocrinology

## 2017-05-31 NOTE — Telephone Encounter (Signed)
[facility] is calling on status of paper they faxed over on 3/27 for patients prescription  [facility] [phone]

## 2017-06-01 ENCOUNTER — Other Ambulatory Visit: Payer: Self-pay

## 2017-06-01 DIAGNOSIS — Z4502 Encounter for adjustment and management of automatic implantable cardiac defibrillator: Secondary | ICD-10-CM | POA: Diagnosis not present

## 2017-06-01 NOTE — Telephone Encounter (Signed)
This is a question for PCP. 

## 2017-06-01 NOTE — Telephone Encounter (Signed)
I spoke with [name] at [facility] she stated that according to Medicare part D they want patient on a statin drug. They were wanting to know if you would prescribe one?

## 2017-06-01 NOTE — Telephone Encounter (Signed)
I called and let [facility] know that this would need to be referred to Dr. Jenny Reichmann his PCP.

## 2017-06-08 ENCOUNTER — Other Ambulatory Visit: Payer: Self-pay | Admitting: Internal Medicine

## 2017-06-08 NOTE — Telephone Encounter (Signed)
04/27/2017 60# 

## 2017-06-09 ENCOUNTER — Other Ambulatory Visit: Payer: Self-pay | Admitting: Internal Medicine

## 2017-06-12 ENCOUNTER — Other Ambulatory Visit: Payer: Self-pay | Admitting: Internal Medicine

## 2017-06-12 NOTE — Telephone Encounter (Signed)
04/27/2017 60# 

## 2017-06-13 ENCOUNTER — Telehealth: Payer: Self-pay | Admitting: Internal Medicine

## 2017-06-13 ENCOUNTER — Other Ambulatory Visit: Payer: Self-pay | Admitting: Internal Medicine

## 2017-06-13 MED ORDER — ZOLPIDEM TARTRATE 5 MG PO TABS: 5.0000 mg | ORAL_TABLET | Freq: Every evening | ORAL | 1 refills | Status: DC | PRN

## 2017-06-13 NOTE — Telephone Encounter (Signed)
Lorrin Mais too soon, since last rx for total 6 mo was just done mar 25

## 2017-06-13 NOTE — Telephone Encounter (Signed)
  04/27/2017 60# 

## 2017-06-13 NOTE — Telephone Encounter (Signed)
Edwena Blow at [facility] calling to let Dr. Jenny Reichmann know that the zolpidem Lorrin Mais) 5 MG tablet sent on 05/22/2017 was never received by their pharmacy. She says their request for it continues to be denied with indication that is was already sent on that date. Please resend a new script.

## 2017-06-13 NOTE — Telephone Encounter (Signed)
Copied from [place] [id number]. Topic: Quick Communication - Rx Refill/Question >> Jun 13, 2017  2:23 PM Yvette Rack wrote: Medication: zolpidem (AMBIEN) 5 MG tablet Has the patient contacted their pharmacy? Yes.  About 3 times the pharmacy tried to reach out to the provider (Agent: If no, request that the patient contact the pharmacy for the refill.) Preferred Pharmacy (with phone number or street name):   [facility],  - [place], [place] - [address] [phone] (Phone) [phone] (Fax)     Agent: Please be advised that RX refills may take up to 3 business days. We ask that you follow-up with your pharmacy.

## 2017-06-13 NOTE — Telephone Encounter (Signed)
Done erx 

## 2017-07-19 ENCOUNTER — Other Ambulatory Visit: Payer: Self-pay | Admitting: Internal Medicine

## 2017-07-19 NOTE — Telephone Encounter (Signed)
Done erx 

## 2017-07-19 NOTE — Telephone Encounter (Signed)
06/21/2017 

## 2017-07-20 ENCOUNTER — Telehealth: Payer: Self-pay | Admitting: Endocrinology

## 2017-07-20 NOTE — Telephone Encounter (Signed)
I called [facility] & asked them to refer this to PCP.

## 2017-07-20 NOTE — Telephone Encounter (Signed)
I only see pt for DM

## 2017-07-20 NOTE — Telephone Encounter (Signed)
[facility] stated that patient need a statin prescription, dosen't matter what kind of statin drug it is. Any questions please call  [phone]

## 2017-07-31 ENCOUNTER — Ambulatory Visit: Payer: Medicare Other | Admitting: Endocrinology

## 2017-07-31 ENCOUNTER — Encounter: Payer: Self-pay | Admitting: Endocrinology

## 2017-07-31 VITALS — BP 128/72 | HR 72 | Temp 98.1°F | Ht 70.0 in | Wt 198.0 lb

## 2017-07-31 DIAGNOSIS — E0800 Diabetes mellitus due to underlying condition with hyperosmolarity without nonketotic hyperglycemic-hyperosmolar coma (NKHHC): Secondary | ICD-10-CM

## 2017-07-31 DIAGNOSIS — Z23 Encounter for immunization: Secondary | ICD-10-CM | POA: Diagnosis not present

## 2017-07-31 LAB — POCT GLYCOSYLATED HEMOGLOBIN (HGB A1C): Hemoglobin A1C: 6.6 % — AB (ref 4.0–5.6)

## 2017-07-31 NOTE — Progress Notes (Signed)
Subjective:    Patient ID: [name], male    DOB: May 10, 1945, 72 y.o.   MRN: [id number]  HPI Pt returns for f/u of diabetes mellitus: DM type: Insulin-requiring type 2 Dx'ed: 8786 Complications: polyneuropathy and CAD Therapy: insulin since mid-2018, and pioglitizone.  DKA: never Severe hypoglycemia: never.   Pancreatitis: never.  Pancreatic imaging: never Other: He did not tolerate metformin (diarrhea); he declines V-GO, due to cost.  He takes multiple daily injections.   Interval history: he brings a record of his cbg's which I have reviewed today.  It varies from 69-228.  There is no trend throughout the day.  He says he never misses the insulin.   Past Medical History:  Diagnosis Date  . CALLUS, RIGHT FOOT 12/08/2009  . Cardiac arrest ([place])   . Chronic systolic heart failure ([place]) 11/03/2009  . DIABETES MELLITUS, TYPE II 12/05/2006  . 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 HYPERLIPIDEMIA 11/25/2006  . HYPOGONADISM, MALE 12/05/2006  . Implantable cardiac defibrillator MDT 09/22/2008   Change out feb 2011, 2015  . Ischemic cardiomyopathy 09/22/2008   DES CX and RCA  70% residual LAD  Done in [place]; EF 30%;; myoviewq 05/31/06 no ischemia  . Kidney stones   . Long term (current) use of anticoagulants 04/25/2010  . Myocardial infarction [facility]) 05/30/2004   "I died and they brought me back"  . 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 NEPHROLITHIASIS, HX OF 02/25/2008  . Persistent atrial fibrillation ([place]) 11/25/2006   Inapp shock 2010-05-31 AF VR >220 recurrent    Past Surgical History:  Procedure Laterality Date  . CARDIAC CATHETERIZATION     feburary 30-May-2012  . CARDIAC DEFIBRILLATOR PLACEMENT     Guidant Vitality [name]  . CARDIOVERSION N/A 04/25/2012   Procedure: CARDIOVERSION;  Surgeon: Darlin Coco, MD;  Location: [facility] ENDOSCOPY;  Service: Cardiovascular;  Laterality: N/A;  . CARDIOVERSION N/A 05/21/2012   Procedure: CARDIOVERSION;  Surgeon: Deboraha Sprang, MD;  Location: [place];  Service: Cardiovascular;  Laterality: N/A;  . CATARACT EXTRACTION    . EYE SURGERY     cataracts  . IMPLANTABLE CARDIOVERTER DEFIBRILLATOR (ICD) GENERATOR CHANGE N/A 02/05/2014   Procedure: ICD GENERATOR CHANGE;  Surgeon: Deboraha Sprang, MD;  Location[facility] C[facility] LAB;  Service: Cardiovascular;  Laterality: N/A;  . PTCA    . TONSILLECTOMY    . TONSILLECTOMY      Social History   Socioeconomic History  . Marital status: Divorced    Spouse name: Not on file  . Number of children: 1  . Years of education: 48  . Highest education level: Not on file  Occupational History  . 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 Financial resource strain: Not on file  . Food insecurity:    Worry: Not on file    Inability: Not on file  . Transportation needs:    Medical: Not on file    Non-medical: Not on file  Tobacco Use  . Smoking status: Never Smoker  . Smokeless tobacco: Never Used  Substance and Sexual Activity  . Alcohol use: Yes    Alcohol/week: 0.0 oz    Comment: summer   . Drug use: No  . Sexual activity: Yes    Partners: Female  Lifestyle  . Physical activity:    Days per week: Not on file    Minutes per session: Not on file  .  Stress: Not on file  Relationships  . Social connections:    Talks on phone: Not on file    Gets together: Not on file    Attends religious service: Not on file    Active member of club or organization: Not on file    Attends meetings of clubs or organizations: Not on file    Relationship status: Not on file  . Intimate partner violence:    Fear of current or ex partner: Not on file    Emotionally abused: Not on file    Physically abused: Not on file    Forced sexual activity: Not on file  Other Topics Concern  . Not on file  Social History Narrative   HSG, College grad. Married - divorced. 1 daughter. owner/operator electronics business and show production     Current Outpatient Medications on File Prior to Visit  Medication Sig Dispense Refill  . carvedilol (COREG) 12.5 MG tablet Take 1 tablet (12.5 mg total) by mouth 2 (two) times daily with a meal. 180 tablet 5  . citalopram (CELEXA) 10 MG tablet Take 1 tablet (10 mg total) by mouth daily. 90 tablet 3  . dofetilide (TIKOSYN) 500 MCG capsule TAKE 1 CAPSULE BY MOUTH 2 TIMES DAILY 60 capsule 0  . fluticasone (FLONASE) 50 MCG/ACT nasal spray Instill 2 sprays into both nostrils daily. (Patient taking differently: Instill 2 sprays into both nostrils daily as needed for allergies) 16 g 5  . furosemide (LASIX) 20 MG tablet Take 1 tablet by mouth daily. OVERDUE FOR FOLLOW UP. PLEASE CALL AND SCHEDULE 15 tablet 0  . Glucose Blood (FREESTYLE PRECISION NEO TEST VI) by In Vitro route.    Marland Kitchen glucose blood (ONE TOUCH ULTRA TEST) test strip 1 each by Other route 2 (two) times daily. 200 each 2  . Insulin Glargine (LANTUS SOLOSTAR) 100 UNIT/ML Solostar Pen Inject 25 Units into the skin every morning. And pen needles 1/day 15 mL 3  . insulin lispro (HUMALOG KWIKPEN) 100 UNIT/ML KiwkPen Inject 5 units into the skin three times daily with meals. 15 mL 11  . Lancets MISC Use as directed up to 4 times per day 100 each 11  . lisinopril (PRINIVIL,ZESTRIL) 5 MG tablet Take 5 mg by mouth daily.    Marland Kitchen loratadine (CLARITIN) 10 MG tablet Take 10 mg by mouth daily.    Marland Kitchen LORazepam (ATIVAN) 1 MG tablet TAKE 1/2 TABLET BY MOUTH EVERY DAY AS NEEDED FOR ANXIETY 45 tablet 1  . Magnesium Oxide (MAG-OX 400 PO) Take 1 tablet by mouth 2 (two) times daily.    . Multiple Vitamins-Minerals (MULTIVITAMIN PO) Take 1 tablet by mouth daily.    Marland Kitchen NITROSTAT 0.4 MG SL tablet Place 1 tablet (0.4 mg total) under the tongue every 5 (five) minutes as needed for chest pain. 25 tablet 5  . pioglitazone (ACTOS) 30 MG tablet Take 1 tablet (30 mg total) by mouth daily. 90 tablet 3  . ranolazine (RANEXA) 500 MG 12 hr tablet Take 1 tablet (500 mg total) by  mouth 2 (two) times daily. 60 tablet 6  . sucralfate (CARAFATE) 1 g tablet Take 1 g by mouth 4 (four) times daily -  with meals and at bedtime.    Alveda Reasons 20 MG TABS tablet TAKE 1 TABLET BY MOUTH EVERY DAY 30 tablet 5  . zolpidem (AMBIEN) 5 MG tablet Take 1-2 tablets (5-10 mg total) by mouth at bedtime as needed. 180 tablet 1   No current facility-administered medications  on file prior to visit.     Allergies  Allergen Reactions  . Salmon [Fish Allergy] Other (See Comments)    Only canned/ not fresh (probably preservatives).   . Metformin And Related Other (See Comments)    diarrhea    Family History  Problem Relation Age of Onset  . Diabetes Maternal Grandmother   . Hyperlipidemia Maternal Grandmother   . Breast cancer Mother   . Cancer Neg Hx   . COPD Neg Hx   . Heart disease Neg Hx     BP 128/72 (BP Location: Right Arm, Patient Position: Sitting, Cuff Size: Normal)   Pulse 72   Temp 98.1 F (36.7 C) (Oral)   Ht 5\' 10"  (1.778 m)   Wt 198 lb (89.8 kg)   SpO2 94%   BMI 28.41 kg/m    Review of Systems Denies LOC    Objective:   Physical Exam VITAL SIGNS:  See vs page.  GENERAL: no distress. Pulses: foot pulses are intact bilaterally.   MSK: no deformity of the feet or ankles.  There are several nodules at the plantar aspect of the feet.   CV: no edema of the legs or ankles Skin:  no ulcer on the feet or ankles.  normal color and temp on the feet and ankles Neuro: sensation is intact to touch on the feet and ankles.     Lab Results  Component Value Date   HGBA1C 6.6 (A) 07/31/2017       Assessment & Plan:  Insulin-requiring type 2 DM, with CAD: well-controlled Hypoglycemia: mild: this limits aggressiveness of glycemic control  Patient Instructions  check your blood sugar twice a day.  vary the time of day when you check, between before the 3 meals, and at bedtime.  also check if you have symptoms of your blood sugar being too high or too low.  please keep  a record of the readings and bring it to your next appointment here (or you can bring the meter itself).  You can write it on any piece of paper.  please call us sooner if your blood sugar goes below 70, or if you have a lot of readings over 200.   Please continue the same medications for now.   Please come back for a follow-up appointment in 4 months.

## 2017-07-31 NOTE — Patient Instructions (Addendum)
check your blood sugar twice a day.  vary the time of day when you check, between before the 3 meals, and at bedtime.  also check if you have symptoms of your blood sugar being too high or too low.  please keep a record of the readings and bring it to your next appointment here (or you can bring the meter itself).  You can write it on any piece of paper.  please call us sooner if your blood sugar goes below 70, or if you have a lot of readings over 200.   Please continue the same medications for now.   Please come back for a follow-up appointment in 4 months.

## 2017-08-16 ENCOUNTER — Other Ambulatory Visit: Payer: Self-pay | Admitting: Internal Medicine

## 2017-08-31 DIAGNOSIS — Z4502 Encounter for adjustment and management of automatic implantable cardiac defibrillator: Secondary | ICD-10-CM | POA: Diagnosis not present

## 2017-08-31 DIAGNOSIS — I509 Heart failure, unspecified: Secondary | ICD-10-CM | POA: Diagnosis not present

## 2017-08-31 DIAGNOSIS — I255 Ischemic cardiomyopathy: Secondary | ICD-10-CM | POA: Diagnosis not present

## 2017-09-08 DIAGNOSIS — Z1283 Encounter for screening for malignant neoplasm of skin: Secondary | ICD-10-CM | POA: Diagnosis not present

## 2017-09-08 DIAGNOSIS — Z08 Encounter for follow-up examination after completed treatment for malignant neoplasm: Secondary | ICD-10-CM | POA: Diagnosis not present

## 2017-09-08 DIAGNOSIS — L570 Actinic keratosis: Secondary | ICD-10-CM | POA: Diagnosis not present

## 2017-09-08 DIAGNOSIS — Z85828 Personal history of other malignant neoplasm of skin: Secondary | ICD-10-CM | POA: Diagnosis not present

## 2017-09-16 ENCOUNTER — Other Ambulatory Visit: Payer: Self-pay | Admitting: Endocrinology

## 2017-09-18 LAB — FECAL OCCULT BLOOD, IMMUNOCHEMICAL: IFOBT: NEGATIVE

## 2017-09-20 DIAGNOSIS — Z0000 Encounter for general adult medical examination without abnormal findings: Secondary | ICD-10-CM | POA: Diagnosis not present

## 2017-10-17 ENCOUNTER — Encounter: Payer: Self-pay | Admitting: Internal Medicine

## 2017-10-17 ENCOUNTER — Other Ambulatory Visit: Payer: Self-pay | Admitting: Internal Medicine

## 2017-10-17 ENCOUNTER — Ambulatory Visit (INDEPENDENT_AMBULATORY_CARE_PROVIDER_SITE_OTHER): Payer: Medicare Other | Admitting: Internal Medicine

## 2017-10-17 ENCOUNTER — Other Ambulatory Visit (INDEPENDENT_AMBULATORY_CARE_PROVIDER_SITE_OTHER): Payer: Medicare Other

## 2017-10-17 ENCOUNTER — Telehealth: Payer: Self-pay

## 2017-10-17 VITALS — BP 116/78 | HR 60 | Temp 97.8°F | Ht 70.0 in | Wt 194.0 lb

## 2017-10-17 DIAGNOSIS — Z0000 Encounter for general adult medical examination without abnormal findings: Secondary | ICD-10-CM

## 2017-10-17 DIAGNOSIS — E0842 Diabetes mellitus due to underlying condition with diabetic polyneuropathy: Secondary | ICD-10-CM

## 2017-10-17 LAB — BASIC METABOLIC PANEL
BUN: 27 mg/dL — ABNORMAL HIGH (ref 6–23)
CALCIUM: 9.6 mg/dL (ref 8.4–10.5)
CO2: 28 mEq/L (ref 19–32)
Chloride: 98 mEq/L (ref 96–112)
Creatinine, Ser: 1.29 mg/dL (ref 0.40–1.50)
GFR: 58.52 mL/min — AB (ref >60.00)
GLUCOSE: 148 mg/dL — AB (ref 70–99)
Potassium: 4.7 mEq/L (ref 3.5–5.1)
SODIUM: 136 meq/L (ref 135–145)

## 2017-10-17 LAB — URINALYSIS, ROUTINE W REFLEX MICROSCOPIC
Bilirubin Urine: NEGATIVE
Hgb urine dipstick: NEGATIVE
Ketones, ur: NEGATIVE
Leukocytes, UA: NEGATIVE
Nitrite: NEGATIVE
RBC / HPF: NONE SEEN (ref 0–?)
SPECIFIC GRAVITY, URINE: 1.02 (ref 1.000–1.030)
Total Protein, Urine: NEGATIVE
Urine Glucose: NEGATIVE
Urobilinogen, UA: 0.2 (ref 0.0–1.0)
pH: 6.5 (ref 5.0–8.0)

## 2017-10-17 LAB — MICROALBUMIN / CREATININE URINE RATIO
Creatinine,U: 121.3 mg/dL
Microalb Creat Ratio: 0.7 mg/g (ref 0.0–30.0)
Microalb, Ur: 0.8 mg/dL (ref 0.0–1.9)

## 2017-10-17 LAB — HEPATIC FUNCTION PANEL
ALT: 11 U/L (ref 0–53)
AST: 13 U/L (ref 0–37)
Albumin: 4 g/dL (ref 3.5–5.2)
Alkaline Phosphatase: 75 U/L (ref 39–117)
BILIRUBIN DIRECT: 0.1 mg/dL (ref 0.0–0.3)
Total Bilirubin: 0.9 mg/dL (ref 0.2–1.2)
Total Protein: 7.4 g/dL (ref 6.0–8.3)

## 2017-10-17 LAB — LIPID PANEL
Cholesterol: 219 mg/dL — ABNORMAL HIGH (ref 0–200)
HDL: 39 mg/dL — AB (ref >39.00)
NonHDL: 179.86
Total CHOL/HDL Ratio: 6
Triglycerides: 249 mg/dL — ABNORMAL HIGH (ref 0.0–149.0)
VLDL: 49.8 mg/dL — ABNORMAL HIGH (ref 0.0–40.0)

## 2017-10-17 LAB — CBC WITH DIFFERENTIAL/PLATELET
BASOS PCT: 0.8 % (ref 0.0–3.0)
Basophils Absolute: 0.1 10*3/uL (ref 0.0–0.1)
EOS PCT: 3.9 % (ref 0.0–5.0)
Eosinophils Absolute: 0.3 10*3/uL (ref 0.0–0.7)
HCT: 42.8 % (ref 39.0–52.0)
HEMOGLOBIN: 14.5 g/dL (ref 13.0–17.0)
LYMPHS ABS: 1.2 10*3/uL (ref 0.7–4.0)
Lymphocytes Relative: 19.2 % (ref 12.0–46.0)
MCHC: 34 g/dL (ref 30.0–36.0)
MCV: 91.2 fl (ref 78.0–100.0)
MONO ABS: 0.9 10*3/uL (ref 0.1–1.0)
Monocytes Relative: 13.5 % — ABNORMAL HIGH (ref 3.0–12.0)
NEUTROS PCT: 62.6 % (ref 43.0–77.0)
Neutro Abs: 4 10*3/uL (ref 1.4–7.7)
Platelets: 147 10*3/uL — ABNORMAL LOW (ref 150.0–400.0)
RBC: 4.69 Mil/uL (ref 4.22–5.81)
RDW: 14.1 % (ref 11.5–15.5)
WBC: 6.4 10*3/uL (ref 4.0–10.5)

## 2017-10-17 LAB — LDL CHOLESTEROL, DIRECT: LDL DIRECT: 134 mg/dL

## 2017-10-17 LAB — HEMOGLOBIN A1C: HEMOGLOBIN A1C: 7.1 % — AB (ref 4.6–6.5)

## 2017-10-17 LAB — PSA: PSA: 0.59 ng/mL (ref 0.10–4.00)

## 2017-10-17 LAB — TSH: TSH: 2.59 u[IU]/mL (ref 0.35–4.50)

## 2017-10-17 MED ORDER — ROSUVASTATIN CALCIUM 20 MG PO TABS: 20.0000 mg | ORAL_TABLET | Freq: Every day | ORAL | 3 refills | Status: DC

## 2017-10-17 NOTE — Assessment & Plan Note (Signed)

## 2017-10-17 NOTE — Telephone Encounter (Signed)
Done erx 

## 2017-10-17 NOTE — Assessment & Plan Note (Signed)
stable overall by history and exam, recent data reviewed with pt, and pt to continue medical treatment as before,  to f/u any worsening symptoms or concerns, for f/u a1c 

## 2017-10-17 NOTE — Telephone Encounter (Signed)
   LOV: 10/17/17 NextOV: 10/23/18 Last Filled/Quantity: 09/15/17 60#

## 2017-10-17 NOTE — Patient Instructions (Signed)

## 2017-10-17 NOTE — Progress Notes (Signed)
Subjective:    Patient ID: [name], male    DOB: [birth year], 72 y.o.   MRN: [id number]  HPI  Here for wellness and f/u;  Overall doing ok;  Pt denies Chest pain, worsening SOB, DOE, wheezing, orthopnea, PND, worsening LE edema, palpitations, dizziness or syncope.  Pt denies neurological change such as new headache, facial or extremity weakness.  Pt denies polydipsia, polyuria, or low sugar symptoms. Pt states overall good compliance with treatment and medications, good tolerability, and has been trying to follow appropriate diet.  Pt denies worsening depressive symptoms, suicidal ideation or panic. No fever, night sweats, wt loss, loss of appetite, or other constitutional symptoms.  Pt states good ability with ADL's, has low fall risk, home safety reviewed and adequate, no other significant changes in hearing or vision, and only occasionally active with exercise. 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  Sees podiatry, and endo for DM.  No new complaints Past Medical History:  Diagnosis Date  . CALLUS, RIGHT FOOT 12/08/2009  . Cardiac arrest ([place])   . Chronic systolic heart failure ([place]) 11/03/2009  . DIABETES MELLITUS, TYPE II 12/05/2006  . 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 HYPERLIPIDEMIA 11/25/2006  . HYPOGONADISM, MALE 12/05/2006  . Implantable cardiac defibrillator MDT 09/22/2008   Change out feb 2011, 2015  . Ischemic cardiomyopathy 09/22/2008   DES CX and RCA  70% residual LAD  Done in [place]; EF 30%;; myoviewq 06-24-06 no ischemia  . Kidney stones   . Long term (current) use of anticoagulants 04/25/2010  . Myocardial infarction [facility]) June 23, 2004   "I died and they brought me back"  . 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 NEPHROLITHIASIS, HX OF 02/25/2008  . Persistent atrial fibrillation ([place]) 11/25/2006   Inapp shock 06/24/2010 AF VR >220 recurrent   Past Surgical History:  Procedure Laterality Date  . CARDIAC CATHETERIZATION     feburary 2012/06/23  . CARDIAC  DEFIBRILLATOR PLACEMENT     Guidant Vitality [name]  . CARDIOVERSION N/A 04/25/2012   Procedure: CARDIOVERSION;  Surgeon: Darlin Coco, MD;  Location[facility] ENDOSCOPY;  Service: Cardiovascular;  Laterality: N/A;  . CARDIOVERSION N/A 05/21/2012   Procedure: CARDIOVERSION;  Surgeon: Deboraha Sprang, MD;  Location: [place];  Service: Cardiovascular;  Laterality: N/A;  . CATARACT EXTRACTION    . EYE SURGERY     cataracts  . IMPLANTABLE CARDIOVERTER DEFIBRILLATOR (ICD) GENERATOR CHANGE N/A 02/05/2014   Procedure: ICD GENERATOR CHANGE;  Surgeon: Deboraha Sprang, MD;  Location: [facility] CATH LAB;  Service: Cardiovascular;  Laterality: N/A;  . PTCA    . TONSILLECTOMY    . TONSILLECTOMY      reports that he has never smoked. He has never used smokeless tobacco. He reports that he drinks alcohol. He reports that he does not use drugs. family history includes Breast cancer in his mother; Diabetes in his maternal grandmother; Hyperlipidemia in his maternal grandmother. Allergies  Allergen Reactions  . Salmon [Fish Allergy] Other (See Comments)    Only canned/ not fresh (probably preservatives).   . Metformin And Related Other (See Comments)    diarrhea   Current Outpatient Medications on File Prior to Visit  Medication Sig Dispense Refill  . carvedilol (COREG) 12.5 MG tablet Take 1 tablet (12.5 mg total) by mouth 2 (two) times daily with a meal. 180 tablet 5  . citalopram (CELEXA) 10 MG tablet Take 1 tablet (10 mg total) by mouth daily. 90 tablet  3  . dofetilide (TIKOSYN) 500 MCG capsule TAKE 1 CAPSULE BY MOUTH 2 TIMES DAILY 60 capsule 0  . fluticasone (FLONASE) 50 MCG/ACT nasal spray Instill 2 sprays into both nostrils daily. (Patient taking differently: Instill 2 sprays into both nostrils daily as needed for allergies) 16 g 5  . furosemide (LASIX) 20 MG tablet Take 1 tablet by mouth daily. OVERDUE FOR FOLLOW UP. PLEASE CALL AND SCHEDULE 15 tablet 0  . Glucose Blood (FREESTYLE PRECISION NEO TEST VI) by  In Vitro route.    Marland Kitchen glucose blood (ONE TOUCH ULTRA TEST) test strip 1 each by Other route 2 (two) times daily. 200 each 2  . insulin lispro (HUMALOG KWIKPEN) 100 UNIT/ML KiwkPen Inject 5 units into the skin three times daily with meals. 15 mL 11  . Lancets MISC Use as directed up to 4 times per day 100 each 11  . LANTUS SOLOSTAR 100 UNIT/ML Solostar Pen Inject 25 Units into the skin every morning. And pen needles 1/day 15 mL 3  . lisinopril (PRINIVIL,ZESTRIL) 5 MG tablet Take 5 mg by mouth daily.    Marland Kitchen loratadine (CLARITIN) 10 MG tablet Take 10 mg by mouth daily.    Marland Kitchen LORazepam (ATIVAN) 1 MG tablet TAKE 1/2 TABLET BY MOUTH EVERY DAY AS NEEDED FOR ANXIETY 45 tablet 1  . Magnesium Oxide (MAG-OX 400 PO) Take 1 tablet by mouth 2 (two) times daily.    . Multiple Vitamins-Minerals (MULTIVITAMIN PO) Take 1 tablet by mouth daily.    Marland Kitchen NITROSTAT 0.4 MG SL tablet Place 1 tablet (0.4 mg total) under the tongue every 5 (five) minutes as needed for chest pain. 25 tablet 5  . pioglitazone (ACTOS) 30 MG tablet TAKE 1 TABLET BY MOUTH EVERY DAY 90 tablet 0  . ranolazine (RANEXA) 500 MG 12 hr tablet Take 1 tablet (500 mg total) by mouth 2 (two) times daily. 60 tablet 6  . sucralfate (CARAFATE) 1 g tablet Take 1 g by mouth 4 (four) times daily -  with meals and at bedtime.    Alveda Reasons 20 MG TABS tablet TAKE 1 TABLET BY MOUTH EVERY DAY 30 tablet 5  . zolpidem (AMBIEN) 5 MG tablet Take 1-2 tablets (5-10 mg total) by mouth at bedtime as needed. 180 tablet 1   No current facility-administered medications on file prior to visit.    Review of Systems Constitutional: Negative for other unusual diaphoresis, sweats, appetite or weight changes HENT: Negative for other worsening hearing loss, ear pain, facial swelling, mouth sores or neck stiffness.   Eyes: Negative for other worsening pain, redness or other visual disturbance.  Respiratory: Negative for other stridor or swelling Cardiovascular: Negative for other  palpitations or other chest pain  Gastrointestinal: Negative for worsening diarrhea or loose stools, blood in stool, distention or other pain Genitourinary: Negative for hematuria, flank pain or other change in urine volume.  Musculoskeletal: Negative for myalgias or other joint swelling.  Skin: Negative for other color change, or other wound or worsening drainage.  Neurological: Negative for other syncope or numbness. Hematological: Negative for other adenopathy or swelling Psychiatric/Behavioral: Negative for hallucinations, other worsening agitation, SI, self-injury, or new decreased concentration All other system neg per pt    Objective:   Physical Exam BP 116/78   Pulse 60   Temp 97.8 F (36.6 C) (Oral)   Ht 5\' 10"  (1.778 m)   Wt 194 lb (88 kg)   SpO2 96%   BMI 27.84 kg/m  VS noted,  Constitutional:  Pt is oriented to person, place, and time. Appears well-developed and well-nourished, in no significant distress and comfortable Head: Normocephalic and atraumatic  Eyes: Conjunctivae and EOM are normal. Pupils are equal, round, and reactive to light Right Ear: External ear normal without discharge Left Ear: External ear normal without discharge Nose: Nose without discharge or deformity Mouth/Throat: Oropharynx is without other ulcerations and moist  Neck: Normal range of motion. Neck supple. No JVD present. No tracheal deviation present or significant neck LA or mass Cardiovascular: Normal rate, regular rhythm, normal heart sounds and intact distal pulses.   Pulmonary/Chest: WOB normal and breath sounds without rales or wheezing  Abdominal: Soft. Bowel sounds are normal. NT. No HSM  Musculoskeletal: Normal range of motion. Exhibits no edema Lymphadenopathy: Has no other cervical adenopathy.  Neurological: Pt is alert and oriented to person, place, and time. Pt has normal reflexes. No cranial nerve deficit. Motor grossly intact, Gait intact Skin: Skin is warm and dry. No rash  noted or new ulcerations Psychiatric:  Has normal mood and affect. Behavior is normal without agitation No other exam findings Lab Results  Component Value Date   WBC 6.3 03/18/2017   HGB 13.8 03/18/2017   HCT 41.0 03/18/2017   PLT 147 (L) 03/18/2017   GLUCOSE 153 (H) 03/18/2017   CHOL 179 10/12/2016   TRIG 319.0 (H) 10/12/2016   HDL 33.30 (L) 10/12/2016   LDLDIRECT 100.0 10/12/2016   LDLCALC 91 02/11/2015   ALT 20 10/12/2016   AST 17 10/12/2016   NA 134 (L) 03/18/2017   K 3.9 03/18/2017   CL 100 (L) 03/18/2017   CREATININE 1.18 03/18/2017   BUN 17 03/18/2017   CO2 24 03/18/2017   TSH 6.02 (H) 10/12/2016   PSA 0.41 10/12/2016   INR 1.64 03/18/2017   HGBA1C 6.6 (A) 07/31/2017   MICROALBUR 4.8 (H) 10/12/2016       Assessment & Plan:

## 2017-10-17 NOTE — Telephone Encounter (Signed)
Pt has been informed of results and expressed understanding.  °

## 2017-10-17 NOTE — Telephone Encounter (Signed)
-----   Message from Biagio Borg, MD sent at 10/17/2017  4:44 PM EDT ----- Left message on MyChart, pt to cont same tx except  The test results show that your current treatment is OK, except the LDL cholesterol is moderately high, as the goal is to be less than 70.  You are on several medications, but we need to add crestor 20 mg per day to help reduce your future heart and stroke risk.  I will send the prescription and you should hear from the office as well    Alisha Burgo to please inform pt, I will do rx

## 2017-11-14 DIAGNOSIS — I493 Ventricular premature depolarization: Secondary | ICD-10-CM | POA: Diagnosis not present

## 2017-11-14 DIAGNOSIS — I2510 Atherosclerotic heart disease of native coronary artery without angina pectoris: Secondary | ICD-10-CM | POA: Diagnosis not present

## 2017-11-14 DIAGNOSIS — I252 Old myocardial infarction: Secondary | ICD-10-CM | POA: Diagnosis not present

## 2017-11-14 DIAGNOSIS — E785 Hyperlipidemia, unspecified: Secondary | ICD-10-CM | POA: Diagnosis not present

## 2017-11-14 DIAGNOSIS — I4891 Unspecified atrial fibrillation: Secondary | ICD-10-CM | POA: Diagnosis not present

## 2017-11-14 DIAGNOSIS — Z79899 Other long term (current) drug therapy: Secondary | ICD-10-CM | POA: Diagnosis not present

## 2017-11-14 DIAGNOSIS — E119 Type 2 diabetes mellitus without complications: Secondary | ICD-10-CM | POA: Diagnosis not present

## 2017-11-14 DIAGNOSIS — Z794 Long term (current) use of insulin: Secondary | ICD-10-CM | POA: Diagnosis not present

## 2017-11-14 DIAGNOSIS — Z6827 Body mass index (BMI) 27.0-27.9, adult: Secondary | ICD-10-CM | POA: Diagnosis not present

## 2017-11-14 DIAGNOSIS — Z7901 Long term (current) use of anticoagulants: Secondary | ICD-10-CM | POA: Diagnosis not present

## 2017-11-14 DIAGNOSIS — I110 Hypertensive heart disease with heart failure: Secondary | ICD-10-CM | POA: Diagnosis not present

## 2017-11-14 DIAGNOSIS — Z882 Allergy status to sulfonamides status: Secondary | ICD-10-CM | POA: Diagnosis not present

## 2017-11-14 DIAGNOSIS — I509 Heart failure, unspecified: Secondary | ICD-10-CM | POA: Diagnosis not present

## 2017-11-14 DIAGNOSIS — Z955 Presence of coronary angioplasty implant and graft: Secondary | ICD-10-CM | POA: Diagnosis not present

## 2017-11-20 ENCOUNTER — Other Ambulatory Visit: Payer: Self-pay | Admitting: Internal Medicine

## 2017-11-30 DIAGNOSIS — Z95810 Presence of automatic (implantable) cardiac defibrillator: Secondary | ICD-10-CM | POA: Diagnosis not present

## 2017-11-30 DIAGNOSIS — I255 Ischemic cardiomyopathy: Secondary | ICD-10-CM | POA: Diagnosis not present

## 2017-11-30 DIAGNOSIS — I509 Heart failure, unspecified: Secondary | ICD-10-CM | POA: Diagnosis not present

## 2017-11-30 DIAGNOSIS — Z4502 Encounter for adjustment and management of automatic implantable cardiac defibrillator: Secondary | ICD-10-CM | POA: Diagnosis not present

## 2017-12-04 ENCOUNTER — Encounter: Payer: Self-pay | Admitting: Endocrinology

## 2017-12-04 ENCOUNTER — Ambulatory Visit: Payer: Medicare Other | Admitting: Endocrinology

## 2017-12-04 VITALS — BP 102/62 | HR 65 | Ht 70.0 in | Wt 202.0 lb

## 2017-12-04 DIAGNOSIS — E0842 Diabetes mellitus due to underlying condition with diabetic polyneuropathy: Secondary | ICD-10-CM

## 2017-12-04 NOTE — Patient Instructions (Addendum)
check your blood sugar twice a day.  vary the time of day when you check, between before the 3 meals, and at bedtime.  also check if you have symptoms of your blood sugar being too high or too low.  please keep a record of the readings and bring it to your next appointment here (or you can bring the meter itself).  You can write it on any piece of paper.  please call us sooner if your blood sugar goes below 70, or if you have a lot of readings over 200.  Please continue the same insulins.  Please come back for a follow-up appointment in 4 months.   

## 2017-12-04 NOTE — Progress Notes (Signed)
Subjective:    Patient ID: [name], male    DOB: 1945-07-29, 72 y.o.   MRN: [id number]  HPI Pt returns for f/u of diabetes mellitus: DM type: Insulin-requiring type 2 Dx'ed: 6962 Complications: polyneuropathy, renal insuff, and CAD Therapy: insulin since mid-2018, and pioglitizone.  DKA: never Severe hypoglycemia: never.   Pancreatitis: never.  Pancreatic imaging: never Other: He did not tolerate metformin (diarrhea); he declines V-GO, due to cost.  He takes multiple daily injections.   Interval history: Meter is downloaded today, and the printout is scanned into the record.  It varies from 97-195.  There is no trend throughout the day.  He says he never misses the insulin.  Past Medical History:  Diagnosis Date  . CALLUS, RIGHT FOOT 12/08/2009  . Cardiac arrest ([place])   . Chronic systolic heart failure ([place]) 11/03/2009  . DIABETES MELLITUS, TYPE II 12/05/2006  . 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 HYPERLIPIDEMIA 11/25/2006  . HYPOGONADISM, MALE 12/05/2006  . Implantable cardiac defibrillator MDT 09/22/2008   Change out feb 2011, 2015  . Ischemic cardiomyopathy 09/22/2008   DES CX and RCA  70% residual LAD  Done in [place]; EF 30%;; myoviewq 06/10/2006 no ischemia  . Kidney stones   . Long term (current) use of anticoagulants 04/25/2010  . Myocardial infarction [facility]) 09-Jun-2004   "I died and they brought me back"  . 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 NEPHROLITHIASIS, HX OF 02/25/2008  . Persistent atrial fibrillation 11/25/2006   Inapp shock June 10, 2010 AF VR >220 recurrent    Past Surgical History:  Procedure Laterality Date  . CARDIAC CATHETERIZATION     feburary 2012/06/09  . CARDIAC DEFIBRILLATOR PLACEMENT     Guidant Vitality [name]  . CARDIOVERSION N/A 04/25/2012   Procedure: CARDIOVERSION;  Surgeon: Darlin Coco, MD;  Location: [facility] ENDOSCOPY;  Service: Cardiovascular;  Laterality: N/A;  . CARDIOVERSION N/A 05/21/2012   Procedure: CARDIOVERSION;  Surgeon: Deboraha Sprang, MD;  Location: [place];  Service: Cardiovascular;  Laterality: N/A;  . CATARACT EXTRACTION    . EYE SURGERY     cataracts  . IMPLANTABLE CARDIOVERTER DEFIBRILLATOR (ICD) GENERATOR CHANGE N/A 02/05/2014   Procedure: ICD GENERATOR CHANGE;  Surgeon: Deboraha Sprang, MD;  Location: [facility] CATH LAB;  Service: Cardiovascular;  Laterality: N/A;  . PTCA    . TONSILLECTOMY    . TONSILLECTOMY      Social History   Socioeconomic History  . Marital status: Divorced    Spouse name: Not on file  . Number of children: 1  . Years of education: 85  . Highest education level: Not on file  Occupational History  . 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 Financial resource strain: Not on file  . Food insecurity:    Worry: Not on file    Inability: Not on file  . Transportation needs:    Medical: Not on file    Non-medical: Not on file  Tobacco Use  . Smoking status: Never Smoker  . Smokeless tobacco: Never Used  Substance and Sexual Activity  . Alcohol use: Yes    Alcohol/week: 0.0 standard drinks    Comment: summer   . Drug use: No  . Sexual activity: Yes    Partners: Female  Lifestyle  . Physical activity:    Days per week: Not on file    Minutes per session: Not on file  .  Stress: Not on file  Relationships  . Social connections:    Talks on phone: Not on file    Gets together: Not on file    Attends religious service: Not on file    Active member of club or organization: Not on file    Attends meetings of clubs or organizations: Not on file    Relationship status: Not on file  . Intimate partner violence:    Fear of current or ex partner: Not on file    Emotionally abused: Not on file    Physically abused: Not on file    Forced sexual activity: Not on file  Other Topics Concern  . Not on file  Social History Narrative   HSG, College grad. Married - divorced. 1 daughter. owner/operator  electronics business and show production    Current Outpatient Medications on File Prior to Visit  Medication Sig Dispense Refill  . carvedilol (COREG) 12.5 MG tablet Take 1 tablet (12.5 mg total) by mouth 2 (two) times daily with a meal. 180 tablet 5  . citalopram (CELEXA) 10 MG tablet Take 1 tablet (10 mg total) by mouth daily. 90 tablet 3  . dofetilide (TIKOSYN) 500 MCG capsule TAKE 1 CAPSULE BY MOUTH 2 TIMES DAILY 60 capsule 0  . fluticasone (FLONASE) 50 MCG/ACT nasal spray Instill 2 sprays into both nostrils daily. (Patient taking differently: Instill 2 sprays into both nostrils daily as needed for allergies) 16 g 5  . furosemide (LASIX) 20 MG tablet Take 1 tablet by mouth daily. OVERDUE FOR FOLLOW UP. PLEASE CALL AND SCHEDULE 15 tablet 0  . Glucose Blood (FREESTYLE PRECISION NEO TEST VI) by In Vitro route.    Marland Kitchen glucose blood (ONE TOUCH ULTRA TEST) test strip 1 each by Other route 2 (two) times daily. 200 each 2  . insulin lispro (HUMALOG KWIKPEN) 100 UNIT/ML KiwkPen Inject 5 units into the skin three times daily with meals. 15 mL 11  . Lancets MISC Use as directed up to 4 times per day 100 each 11  . LANTUS SOLOSTAR 100 UNIT/ML Solostar Pen Inject 25 Units into the skin every morning. And pen needles 1/day 15 mL 3  . lisinopril (PRINIVIL,ZESTRIL) 5 MG tablet Take 5 mg by mouth daily.    Marland Kitchen loratadine (CLARITIN) 10 MG tablet Take 10 mg by mouth daily.    Marland Kitchen LORazepam (ATIVAN) 1 MG tablet TAKE 1/2 TABLET BY MOUTH EVERY DAY AS NEEDED FOR ANXIETY 45 tablet 1  . Magnesium Oxide (MAG-OX 400 PO) Take 1 tablet by mouth 2 (two) times daily.    . Multiple Vitamins-Minerals (MULTIVITAMIN PO) Take 1 tablet by mouth daily.    Marland Kitchen NITROSTAT 0.4 MG SL tablet Place 1 tablet (0.4 mg total) under the tongue every 5 (five) minutes as needed for chest pain. 25 tablet 5  . pioglitazone (ACTOS) 30 MG tablet TAKE 1 TABLET BY MOUTH EVERY DAY 90 tablet 2  . ranolazine (RANEXA) 500 MG 12 hr tablet Take 1 tablet (500  mg total) by mouth 2 (two) times daily. 60 tablet 6  . rosuvastatin (CRESTOR) 20 MG tablet Take 1 tablet (20 mg total) by mouth daily. 90 tablet 3  . XARELTO 20 MG TABS tablet TAKE 1 TABLET BY MOUTH EVERY DAY 30 tablet 5  . zolpidem (AMBIEN) 5 MG tablet TAKE 1 OR 2 TABLETS BY MOUTH AT BEDTIME AS NEEDED 180 tablet 1  . sucralfate (CARAFATE) 1 g tablet Take 1 g by mouth 4 (four) times daily -  with meals and at bedtime.     No current facility-administered medications on file prior to visit.     Allergies  Allergen Reactions  . Salmon [Fish Allergy] Other (See Comments)    Only canned/ not fresh (probably preservatives).   . Metformin And Related Other (See Comments)    diarrhea    Family History  Problem Relation Age of Onset  . Diabetes Maternal Grandmother   . Hyperlipidemia Maternal Grandmother   . Breast cancer Mother   . Cancer Neg Hx   . COPD Neg Hx   . Heart disease Neg Hx     BP 102/62   Pulse 65   Ht 5\' 10"  (1.778 m)   Wt 202 lb (91.6 kg)   SpO2 95%   BMI 28.98 kg/m    Review of Systems He denies hypoglycemia    Objective:   Physical Exam VITAL SIGNS:  See vs page GENERAL: no distress.  Pulses: dorsalis pedis intact bilat.   MSK: no deformity of the feet CV: no leg edema Skin:  no ulcer on the feet.  normal color and temp on the feet. Neuro: sensation is intact to touch on the feet.    Lab Results  Component Value Date   HGBA1C 7.1 (H) 10/17/2017    Lab Results  Component Value Date   CREATININE 1.29 10/17/2017   BUN 27 (H) 10/17/2017   NA 136 10/17/2017   K 4.7 10/17/2017   CL 98 10/17/2017   CO2 28 10/17/2017      Assessment & Plan:  Insulin-requiring type 2 DM, with renal insuff.  this is the best control this pt should aim for, given variable cbg's.  Patient Instructions  check your blood sugar twice a day.  vary the time of day when you check, between before the 3 meals, and at bedtime.  also check if you have symptoms of your blood  sugar being too high or too low.  please keep a record of the readings and bring it to your next appointment here (or you can bring the meter itself).  You can write it on any piece of paper.  please call us sooner if your blood sugar goes below 70, or if you have a lot of readings over 200.   Please continue the same insulins.  Please come back for a follow-up appointment in 4 months.

## 2018-01-08 ENCOUNTER — Other Ambulatory Visit: Payer: Self-pay | Admitting: Internal Medicine

## 2018-01-15 ENCOUNTER — Other Ambulatory Visit: Payer: Self-pay | Admitting: Internal Medicine

## 2018-02-06 DIAGNOSIS — Z08 Encounter for follow-up examination after completed treatment for malignant neoplasm: Secondary | ICD-10-CM | POA: Diagnosis not present

## 2018-02-06 DIAGNOSIS — D225 Melanocytic nevi of trunk: Secondary | ICD-10-CM | POA: Diagnosis not present

## 2018-02-06 DIAGNOSIS — Z85828 Personal history of other malignant neoplasm of skin: Secondary | ICD-10-CM | POA: Diagnosis not present

## 2018-02-06 DIAGNOSIS — L570 Actinic keratosis: Secondary | ICD-10-CM | POA: Diagnosis not present

## 2018-03-01 DIAGNOSIS — Z4502 Encounter for adjustment and management of automatic implantable cardiac defibrillator: Secondary | ICD-10-CM | POA: Diagnosis not present

## 2018-04-06 ENCOUNTER — Other Ambulatory Visit: Payer: Self-pay | Admitting: Internal Medicine

## 2018-04-06 NOTE — Telephone Encounter (Signed)
Done erx 

## 2018-04-09 ENCOUNTER — Ambulatory Visit: Payer: Medicare Other | Admitting: Endocrinology

## 2018-04-09 ENCOUNTER — Other Ambulatory Visit: Payer: Self-pay

## 2018-04-09 ENCOUNTER — Encounter: Payer: Self-pay | Admitting: Endocrinology

## 2018-04-09 VITALS — BP 123/83 | HR 62 | Temp 98.1°F | Resp 17 | Ht 70.0 in | Wt 200.5 lb

## 2018-04-09 DIAGNOSIS — E0800 Diabetes mellitus due to underlying condition with hyperosmolarity without nonketotic hyperglycemic-hyperosmolar coma (NKHHC): Secondary | ICD-10-CM | POA: Diagnosis not present

## 2018-04-09 LAB — POCT GLYCOSYLATED HEMOGLOBIN (HGB A1C): HEMOGLOBIN A1C: 6.9 % — AB (ref 4.0–5.6)

## 2018-04-09 MED ORDER — FREESTYLE LIBRE 14 DAY READER DEVI: 1.0000 | Freq: Once | 0 refills | Status: DC

## 2018-04-09 MED ORDER — INSULIN GLARGINE 100 UNIT/ML SOLOSTAR PEN: 24.0000 [IU] | PEN_INJECTOR | SUBCUTANEOUS | 3 refills | Status: DC

## 2018-04-09 MED ORDER — FREESTYLE LIBRE 14 DAY SENSOR MISC: 1.0000 | 3 refills | Status: DC

## 2018-04-09 NOTE — Patient Instructions (Addendum)
check your blood sugar twice a day.  vary the time of day when you check, between before the 3 meals, and at bedtime.  also check if you have symptoms of your blood sugar being too high or too low.  please keep a record of the readings and bring it to your next appointment here (or you can bring the meter itself).  You can write it on any piece of paper.  please call us sooner if your blood sugar goes below 70, or if you have a lot of readings over 200.   Please reduce the lantus to 24  Please continue the same humalog Please come back for a follow-up appointment in 4 months.

## 2018-04-09 NOTE — Progress Notes (Signed)
Subjective:    Patient ID: [name], male    DOB: [birth year], 73 y.o.   MRN: [id number]  HPI Pt returns for f/u of diabetes mellitus: DM type: Insulin-requiring type 2.   Dx'ed: 8676 Complications: polyneuropathy, renal insuff, and CAD.   Therapy: insulin since mid-2018, and pioglitizone.  DKA: never Severe hypoglycemia: never.   Pancreatitis: never.  Pancreatic imaging: never Other: He did not tolerate metformin (diarrhea); he declines V-GO, due to cost.  He takes multiple daily injections.  Interval history: Meter is downloaded today, and the printout is scanned into the record.  It varies from 96-186, but he says cbg is sometimes as low as 50.  It is in general slightly higher as the day goes on.  He says he never misses the insulin.   Past Medical History:  Diagnosis Date  . CALLUS, RIGHT FOOT 12/08/2009  . Cardiac arrest ([place])   . Chronic systolic heart failure ([place]) 11/03/2009  . DIABETES MELLITUS, TYPE II 12/05/2006  . 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 HYPERLIPIDEMIA 11/25/2006  . HYPOGONADISM, MALE 12/05/2006  . Implantable cardiac defibrillator MDT 09/22/2008   Change out feb 2011, 2015  . Ischemic cardiomyopathy 09/22/2008   DES CX and RCA  70% residual LAD  Done in [place]; EF 30%;; myoviewq 05-30-2006 no ischemia  . Kidney stones   . Long term (current) use of anticoagulants 04/25/2010  . Myocardial infarction [facility]) 05-29-04   "I died and they brought me back"  . 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 NEPHROLITHIASIS, HX OF 02/25/2008  . Persistent atrial fibrillation 11/25/2006   Inapp shock 05-30-2010 AF VR >220 recurrent    Past Surgical History:  Procedure Laterality Date  . CARDIAC CATHETERIZATION     feburary 05-29-2012  . CARDIAC DEFIBRILLATOR PLACEMENT     Guidant Vitality [name]  . CARDIOVERSION N/A 04/25/2012   Procedure: CARDIOVERSION;  Surgeon: Darlin Coco, MD;  Location: [facility] ENDOSCOPY;  Service: Cardiovascular;  Laterality: N/A;  . CARDIOVERSION N/A  05/21/2012   Procedure: CARDIOVERSION;  Surgeon: Deboraha Sprang, MD;  Location: [place];  Service: Cardiovascular;  Laterality: N/A;  . CATARACT EXTRACTION    . EYE SURGERY     cataracts  . IMPLANTABLE CARDIOVERTER DEFIBRILLATOR (ICD) GENERATOR CHANGE N/A 02/05/2014   Procedure: ICD GENERATOR CHANGE;  Surgeon: Deboraha Sprang, MD;  Location: [facility] CATH LAB;  Service: Cardiovascular;  Laterality: N/A;  . PTCA    . TONSILLECTOMY    . TONSILLECTOMY      Social History   Socioeconomic History  . Marital status: Divorced    Spouse name: Not on file  . Number of children: 1  . Years of education: 74  . Highest education level: Not on file  Occupational History  . 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 Financial resource strain: Not on file  . Food insecurity:    Worry: Not on file    Inability: Not on file  . Transportation needs:    Medical: Not on file    Non-medical: Not on file  Tobacco Use  . Smoking status: Never Smoker  . Smokeless tobacco: Never Used  Substance and Sexual Activity  . Alcohol use: Yes    Alcohol/week: 0.0 standard drinks    Comment: summer   . Drug use: No  . Sexual activity: Yes    Partners: Female  Lifestyle  . Physical activity:  Days per week: Not on file    Minutes per session: Not on file  . Stress: Not on file  Relationships  . Social connections:    Talks on phone: Not on file    Gets together: Not on file    Attends religious service: Not on file    Active member of club or organization: Not on file    Attends meetings of clubs or organizations: Not on file    Relationship status: Not on file  . Intimate partner violence:    Fear of current or ex partner: Not on file    Emotionally abused: Not on file    Physically abused: Not on file    Forced sexual activity: Not on file  Other Topics Concern  . Not on file  Social History Narrative   HSG, College  grad. Married - divorced. 1 daughter. owner/operator electronics business and show production    Current Outpatient Medications on File Prior to Visit  Medication Sig Dispense Refill  . carvedilol (COREG) 12.5 MG tablet Take 1 tablet (12.5 mg total) by mouth 2 (two) times daily with a meal. 180 tablet 5  . citalopram (CELEXA) 10 MG tablet TAKE 1 TABLET BY MOUTH EVERY DAY 90 tablet 0  . dofetilide (TIKOSYN) 500 MCG capsule TAKE 1 CAPSULE BY MOUTH 2 TIMES DAILY 60 capsule 0  . fluticasone (FLONASE) 50 MCG/ACT nasal spray USE 2 SPRAYS IN EACH NOSTRIL EVERY DAY 16 g 5  . furosemide (LASIX) 20 MG tablet Take 1 tablet by mouth daily. OVERDUE FOR FOLLOW UP. PLEASE CALL AND SCHEDULE 15 tablet 0  . Glucose Blood (FREESTYLE PRECISION NEO TEST VI) by In Vitro route.    Marland Kitchen glucose blood (ONE TOUCH ULTRA TEST) test strip 1 each by Other route 2 (two) times daily. 200 each 2  . insulin lispro (HUMALOG KWIKPEN) 100 UNIT/ML KiwkPen Inject 5 units into the skin three times daily with meals. 15 mL 11  . Lancets MISC Use as directed up to 4 times per day 100 each 11  . lisinopril (PRINIVIL,ZESTRIL) 5 MG tablet Take 5 mg by mouth daily.    Marland Kitchen loratadine (CLARITIN) 10 MG tablet Take 10 mg by mouth daily.    Marland Kitchen LORazepam (ATIVAN) 1 MG tablet TAKE 1/2 TABLET BY MOUTH EVERY DAY AS NEEDED FOR ANXIETY 45 tablet 1  . Magnesium Oxide (MAG-OX 400 PO) Take 1 tablet by mouth 2 (two) times daily.    . Multiple Vitamins-Minerals (MULTIVITAMIN PO) Take 1 tablet by mouth daily.    Marland Kitchen NITROSTAT 0.4 MG SL tablet Place 1 tablet (0.4 mg total) under the tongue every 5 (five) minutes as needed for chest pain. 25 tablet 5  . pioglitazone (ACTOS) 30 MG tablet TAKE 1 TABLET BY MOUTH EVERY DAY 90 tablet 2  . ranolazine (RANEXA) 500 MG 12 hr tablet Take 1 tablet (500 mg total) by mouth 2 (two) times daily. 60 tablet 6  . rosuvastatin (CRESTOR) 20 MG tablet Take 1 tablet (20 mg total) by mouth daily. 90 tablet 3  . sucralfate (CARAFATE) 1 g  tablet Take 1 g by mouth 4 (four) times daily -  with meals and at bedtime.    Alveda Reasons 20 MG TABS tablet TAKE 1 TABLET BY MOUTH EVERY DAY 30 tablet 5  . zolpidem (AMBIEN) 5 MG tablet TAKE 1 OR 2 TABLETS BY MOUTH AT BEDTIME AS NEEDED 180 tablet 1   No current facility-administered medications on file prior to visit.  Allergies  Allergen Reactions  . Salmon [Fish Allergy] Other (See Comments)    Only canned/ not fresh (probably preservatives).   . Metformin And Related Other (See Comments)    diarrhea    Family History  Problem Relation Age of Onset  . Diabetes Maternal Grandmother   . Hyperlipidemia Maternal Grandmother   . Breast cancer Mother   . Cancer Neg Hx   . COPD Neg Hx   . Heart disease Neg Hx     BP 123/83   Pulse 62   Temp 98.1 F (36.7 C) (Oral)   Resp 17   Ht 5\' 10"  (1.778 m)   Wt 200 lb 8 oz (90.9 kg)   SpO2 98%   BMI 28.77 kg/m    Review of Systems Denies LOC    Objective:   Physical Exam VITAL SIGNS:  See vs page GENERAL: no distress Pulses: dorsalis pedis intact bilat.   MSK: no deformity of the feet CV: no leg edema Skin:  no ulcer on the feet.  normal color and temp on the feet. Neuro: sensation is intact to touch on the feet   Lab Results  Component Value Date   HGBA1C 6.9 (A) 04/09/2018   Lab Results  Component Value Date   CREATININE 1.29 10/17/2017   BUN 27 (H) 10/17/2017   NA 136 10/17/2017   K 4.7 10/17/2017   CL 98 10/17/2017   CO2 28 10/17/2017       Assessment & Plan:  Type 2 DM, with CAD: this is the best control this pt should aim for, given variable cbg's Hypoglycemia: she needs to reduce insulin Renal insuff: in this setting, lantus is chosen for reduction.    Patient Instructions  check your blood sugar twice a day.  vary the time of day when you check, between before the 3 meals, and at bedtime.  also check if you have symptoms of your blood sugar being too high or too low.  please keep a record of the  readings and bring it to your next appointment here (or you can bring the meter itself).  You can write it on any piece of paper.  please call us sooner if your blood sugar goes below 70, or if you have a lot of readings over 200.   Please reduce the lantus to 24  Please continue the same humalog Please come back for a follow-up appointment in 4 months.

## 2018-04-23 ENCOUNTER — Other Ambulatory Visit: Payer: Self-pay | Admitting: Endocrinology

## 2018-04-27 DIAGNOSIS — I5022 Chronic systolic (congestive) heart failure: Secondary | ICD-10-CM | POA: Diagnosis not present

## 2018-04-27 DIAGNOSIS — Z6829 Body mass index (BMI) 29.0-29.9, adult: Secondary | ICD-10-CM | POA: Diagnosis not present

## 2018-04-27 DIAGNOSIS — Z95810 Presence of automatic (implantable) cardiac defibrillator: Secondary | ICD-10-CM | POA: Diagnosis not present

## 2018-04-27 DIAGNOSIS — I082 Rheumatic disorders of both aortic and tricuspid valves: Secondary | ICD-10-CM | POA: Diagnosis not present

## 2018-04-27 DIAGNOSIS — I4819 Other persistent atrial fibrillation: Secondary | ICD-10-CM | POA: Diagnosis not present

## 2018-04-27 DIAGNOSIS — I252 Old myocardial infarction: Secondary | ICD-10-CM | POA: Diagnosis not present

## 2018-04-27 DIAGNOSIS — I361 Nonrheumatic tricuspid (valve) insufficiency: Secondary | ICD-10-CM | POA: Diagnosis not present

## 2018-04-27 DIAGNOSIS — Z79899 Other long term (current) drug therapy: Secondary | ICD-10-CM | POA: Diagnosis not present

## 2018-04-27 DIAGNOSIS — I2510 Atherosclerotic heart disease of native coronary artery without angina pectoris: Secondary | ICD-10-CM | POA: Diagnosis not present

## 2018-04-27 DIAGNOSIS — I110 Hypertensive heart disease with heart failure: Secondary | ICD-10-CM | POA: Diagnosis not present

## 2018-04-27 DIAGNOSIS — E785 Hyperlipidemia, unspecified: Secondary | ICD-10-CM | POA: Diagnosis not present

## 2018-04-27 DIAGNOSIS — I348 Other nonrheumatic mitral valve disorders: Secondary | ICD-10-CM | POA: Diagnosis not present

## 2018-04-27 DIAGNOSIS — E119 Type 2 diabetes mellitus without complications: Secondary | ICD-10-CM | POA: Diagnosis not present

## 2018-04-27 DIAGNOSIS — I480 Paroxysmal atrial fibrillation: Secondary | ICD-10-CM | POA: Diagnosis not present

## 2018-04-27 DIAGNOSIS — I358 Other nonrheumatic aortic valve disorders: Secondary | ICD-10-CM | POA: Diagnosis not present

## 2018-05-07 DIAGNOSIS — I10 Essential (primary) hypertension: Secondary | ICD-10-CM | POA: Diagnosis not present

## 2018-05-07 DIAGNOSIS — Z6829 Body mass index (BMI) 29.0-29.9, adult: Secondary | ICD-10-CM | POA: Diagnosis not present

## 2018-05-07 DIAGNOSIS — I21A9 Other myocardial infarction type: Secondary | ICD-10-CM | POA: Diagnosis not present

## 2018-05-07 DIAGNOSIS — I5022 Chronic systolic (congestive) heart failure: Secondary | ICD-10-CM | POA: Diagnosis not present

## 2018-05-21 DIAGNOSIS — E1159 Type 2 diabetes mellitus with other circulatory complications: Secondary | ICD-10-CM | POA: Diagnosis not present

## 2018-05-21 DIAGNOSIS — E785 Hyperlipidemia, unspecified: Secondary | ICD-10-CM | POA: Diagnosis not present

## 2018-05-21 DIAGNOSIS — I5022 Chronic systolic (congestive) heart failure: Secondary | ICD-10-CM | POA: Diagnosis not present

## 2018-05-21 DIAGNOSIS — Z794 Long term (current) use of insulin: Secondary | ICD-10-CM | POA: Diagnosis not present

## 2018-05-21 DIAGNOSIS — I480 Paroxysmal atrial fibrillation: Secondary | ICD-10-CM | POA: Diagnosis not present

## 2018-05-22 ENCOUNTER — Other Ambulatory Visit: Payer: Self-pay | Admitting: Internal Medicine

## 2018-05-23 ENCOUNTER — Other Ambulatory Visit: Payer: Self-pay | Admitting: Endocrinology

## 2018-05-31 DIAGNOSIS — Z4502 Encounter for adjustment and management of automatic implantable cardiac defibrillator: Secondary | ICD-10-CM | POA: Diagnosis not present

## 2018-05-31 DIAGNOSIS — I5022 Chronic systolic (congestive) heart failure: Secondary | ICD-10-CM | POA: Diagnosis not present

## 2018-07-02 DIAGNOSIS — I5022 Chronic systolic (congestive) heart failure: Secondary | ICD-10-CM | POA: Diagnosis not present

## 2018-07-02 DIAGNOSIS — Z6828 Body mass index (BMI) 28.0-28.9, adult: Secondary | ICD-10-CM | POA: Diagnosis not present

## 2018-07-09 ENCOUNTER — Other Ambulatory Visit: Payer: Self-pay | Admitting: Endocrinology

## 2018-08-03 ENCOUNTER — Other Ambulatory Visit: Payer: Self-pay

## 2018-08-06 ENCOUNTER — Ambulatory Visit: Payer: Medicare Other | Admitting: Endocrinology

## 2018-08-06 ENCOUNTER — Other Ambulatory Visit: Payer: Self-pay

## 2018-08-06 VITALS — BP 128/60 | HR 78 | Temp 98.2°F | Ht 68.75 in | Wt 201.0 lb

## 2018-08-06 DIAGNOSIS — E1129 Type 2 diabetes mellitus with other diabetic kidney complication: Secondary | ICD-10-CM | POA: Diagnosis not present

## 2018-08-06 DIAGNOSIS — E0800 Diabetes mellitus due to underlying condition with hyperosmolarity without nonketotic hyperglycemic-hyperosmolar coma (NKHHC): Secondary | ICD-10-CM

## 2018-08-06 DIAGNOSIS — E1159 Type 2 diabetes mellitus with other circulatory complications: Secondary | ICD-10-CM

## 2018-08-06 DIAGNOSIS — Z794 Long term (current) use of insulin: Secondary | ICD-10-CM | POA: Diagnosis not present

## 2018-08-06 DIAGNOSIS — E11649 Type 2 diabetes mellitus with hypoglycemia without coma: Secondary | ICD-10-CM

## 2018-08-06 LAB — POCT GLYCOSYLATED HEMOGLOBIN (HGB A1C): Hemoglobin A1C: 6.9 % — AB (ref 4.0–5.6)

## 2018-08-06 MED ORDER — INSULIN GLARGINE 100 UNIT/ML SOLOSTAR PEN: 15.0000 [IU] | PEN_INJECTOR | Freq: Every day | SUBCUTANEOUS | 11 refills | Status: DC

## 2018-08-06 NOTE — Progress Notes (Signed)
Subjective:    Patient ID: [name], male    DOB: 17-Jan-1946, 73 y.o.   MRN: [id number]  HPI Pt returns for f/u of diabetes mellitus: DM type: Insulin-requiring type 2.   Dx'ed: 4270 Complications: polyneuropathy, renal insuff, and CAD.   Therapy: insulin since mid-2018, and pioglitizone.  DKA: never Severe hypoglycemia: never.   Pancreatitis: never.  Pancreatic imaging: never Other: He did not tolerate metformin (diarrhea); he declines V-GO, due to cost.  He takes multiple daily injections.   Interval history: continuous glucose monitor is downloaded today, and the printout is scanned into the record.  It varies from 90-230.  It is in general slightly higher as the day goes on, but not necessarily so.  He says he never misses the insulin, but he often reduces the insulin dosage, due to mild hypoglycemia.   Past Medical History:  Diagnosis Date  . CALLUS, RIGHT FOOT 12/08/2009  . Cardiac arrest ([place])   . Chronic systolic heart failure ([place]) 11/03/2009  . DIABETES MELLITUS, TYPE II 12/05/2006  . 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 HYPERLIPIDEMIA 11/25/2006  . HYPOGONADISM, MALE 12/05/2006  . Implantable cardiac defibrillator MDT 09/22/2008   Change out feb 2011, 2015  . Ischemic cardiomyopathy 09/22/2008   DES CX and RCA  70% residual LAD  Done in [place]; EF 30%;; myoviewq 17-Jun-2006 no ischemia  . Kidney stones   . Long term (current) use of anticoagulants 04/25/2010  . Myocardial infarction [facility]) 06-16-2004   "I died and they brought me back"  . 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 NEPHROLITHIASIS, HX OF 02/25/2008  . Persistent atrial fibrillation 11/25/2006   Inapp shock June 17, 2010 AF VR >220 recurrent    Past Surgical History:  Procedure Laterality Date  . CARDIAC CATHETERIZATION     feburary June 16, 2012  . CARDIAC DEFIBRILLATOR PLACEMENT     Guidant Vitality [name]  . CARDIOVERSION N/A 04/25/2012   Procedure: CARDIOVERSION;  Surgeon: Darlin Coco, MD;  Location: [facility]  ENDOSCOPY;  Service: Cardiovascular;  Laterality: N/A;  . CARDIOVERSION N/A 05/21/2012   Procedure: CARDIOVERSION;  Surgeon: Deboraha Sprang, MD;  Location: [place];  Service: Cardiovascular;  Laterality: N/A;  . CATARACT EXTRACTION    . EYE SURGERY     cataracts  . IMPLANTABLE CARDIOVERTER DEFIBRILLATOR (ICD) GENERATOR CHANGE N/A 02/05/2014   Procedure: ICD GENERATOR CHANGE;  Surgeon: Deboraha Sprang, MD;  Location: [facility] CATH LAB;  Service: Cardiovascular;  Laterality: N/A;  . PTCA    . TONSILLECTOMY    . TONSILLECTOMY      Social History   Socioeconomic History  . Marital status: Divorced    Spouse name: Not on file  . Number of children: 1  . Years of education: 89  . Highest education level: Not on file  Occupational History  . 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 Financial resource strain: Not on file  . Food insecurity:    Worry: Not on file    Inability: Not on file  . Transportation needs:    Medical: Not on file    Non-medical: Not on file  Tobacco Use  . Smoking status: Never Smoker  . Smokeless tobacco: Never Used  Substance and Sexual Activity  . Alcohol use: Yes    Alcohol/week: 0.0 standard drinks    Comment: summer   . Drug use: No  . Sexual activity: Yes  Partners: Female  Lifestyle  . Physical activity:    Days per week: Not on file    Minutes per session: Not on file  . Stress: Not on file  Relationships  . Social connections:    Talks on phone: Not on file    Gets together: Not on file    Attends religious service: Not on file    Active member of club or organization: Not on file    Attends meetings of clubs or organizations: Not on file    Relationship status: Not on file  . Intimate partner violence:    Fear of current or ex partner: Not on file    Emotionally abused: Not on file    Physically abused: Not on file    Forced sexual activity: Not on file  Other  Topics Concern  . Not on file  Social History Narrative   HSG, College grad. Married - divorced. 1 daughter. owner/operator electronics business and show production    Current Outpatient Medications on File Prior to Visit  Medication Sig Dispense Refill  . carvedilol (COREG) 12.5 MG tablet Take 1 tablet (12.5 mg total) by mouth 2 (two) times daily with a meal. 180 tablet 5  . citalopram (CELEXA) 10 MG tablet TAKE 1 TABLET BY MOUTH EVERY DAY 90 tablet 0  . Continuous Blood Gluc Sensor (FREESTYLE LIBRE 14 DAY SENSOR) MISC 1 Device by Does not apply route every 14 (fourteen) days. 6 each 3  . dofetilide (TIKOSYN) 500 MCG capsule TAKE 1 CAPSULE BY MOUTH 2 TIMES DAILY 60 capsule 0  . fluticasone (FLONASE) 50 MCG/ACT nasal spray USE 2 SPRAYS IN EACH NOSTRIL EVERY DAY 16 g 5  . furosemide (LASIX) 20 MG tablet Take 1 tablet by mouth daily. OVERDUE FOR FOLLOW UP. PLEASE CALL AND SCHEDULE 15 tablet 0  . Glucose Blood (FREESTYLE PRECISION NEO TEST VI) by In Vitro route.    Marland Kitchen glucose blood (ONE TOUCH ULTRA TEST) test strip 1 each by Other route 2 (two) times daily. 200 each 2  . HUMALOG KWIKPEN 100 UNIT/ML KwikPen Inject 0.05 mLs (5 Units total) into the skin 3 (three) times daily with meals. 15 mL 11  . insulin lispro (HUMALOG KWIKPEN) 100 UNIT/ML KiwkPen Inject 5 units into the skin three times daily with meals. 15 mL 11  . Lancets MISC Use as directed up to 4 times per day 100 each 11  . liraglutide (VICTOZA) 18 MG/3ML SOPN Inject 1.2 mg into the skin.    Marland Kitchen lisinopril (PRINIVIL,ZESTRIL) 5 MG tablet Take 5 mg by mouth daily.    Marland Kitchen loratadine (CLARITIN) 10 MG tablet Take 10 mg by mouth daily.    Marland Kitchen LORazepam (ATIVAN) 1 MG tablet TAKE 1/2 TABLET BY MOUTH EVERY DAY AS NEEDED FOR ANXIETY 45 tablet 1  . Magnesium Oxide (MAG-OX 400 PO) Take 1 tablet by mouth 2 (two) times daily.    . Multiple Vitamins-Minerals (MULTIVITAMIN PO) Take 1 tablet by mouth daily.    Marland Kitchen NITROSTAT 0.4 MG SL tablet Place 1 tablet (0.4  mg total) under the tongue every 5 (five) minutes as needed for chest pain. 25 tablet 5  . pioglitazone (ACTOS) 30 MG tablet TAKE 1 TABLET BY MOUTH EVERY DAY 90 tablet 2  . ranolazine (RANEXA) 500 MG 12 hr tablet Take 1 tablet (500 mg total) by mouth 2 (two) times daily. 60 tablet 6  . rosuvastatin (CRESTOR) 20 MG tablet Take 1 tablet (20 mg total) by mouth daily. 90 tablet  3  . sacubitril-valsartan (ENTRESTO) 24-26 MG Take 1 tablet by mouth 2 (two) times daily.    . sucralfate (CARAFATE) 1 g tablet Take 1 g by mouth 4 (four) times daily -  with meals and at bedtime.    Marland Kitchen ULTICARE MICRO PEN NEEDLES 32G X 4 MM MISC USE DAILY AS DIRECTED 100 each 3  . XARELTO 20 MG TABS tablet TAKE 1 TABLET BY MOUTH EVERY DAY 30 tablet 5  . zolpidem (AMBIEN) 5 MG tablet TAKE 1 OR 2 TABLETS BY MOUTH AT BEDTIME AS NEEDED 180 tablet 1   No current facility-administered medications on file prior to visit.     Allergies  Allergen Reactions  . Salmon [Fish Allergy] Other (See Comments)    Only canned/ not fresh (probably preservatives).   . Sulfa Antibiotics     Not sure has allergyu to this Only canned/ not fresh (probably preservatives).   . Metformin And Related Other (See Comments)    diarrhea    Family History  Problem Relation Age of Onset  . Diabetes Maternal Grandmother   . Hyperlipidemia Maternal Grandmother   . Breast cancer Mother   . Cancer Neg Hx   . COPD Neg Hx   . Heart disease Neg Hx     BP 128/60 (BP Location: Right Arm, Patient Position: Sitting, Cuff Size: Normal)   Pulse 78   Temp 98.2 F (36.8 C) (Oral)   Ht 5' 8.75" (1.746 m)   Wt 201 lb (91.2 kg)   SpO2 95%   BMI 29.90 kg/m    Review of Systems Denies LOC.      Objective:   Physical Exam VITAL SIGNS:  See vs page GENERAL: no distress Pulses: dorsalis pedis intact bilat.   MSK: no deformity of the feet CV: no leg edema Skin:  no ulcer on the feet, but there are several heavy calluses.  normal color and temp on the  feet.  Neuro: sensation is intact to touch on the feet.    Lab Results  Component Value Date   HGBA1C 6.9 (A) 08/06/2018    Lab Results  Component Value Date   CREATININE 1.29 10/17/2017   BUN 27 (H) 10/17/2017   NA 136 10/17/2017   K 4.7 10/17/2017   CL 98 10/17/2017   CO2 28 10/17/2017      Assessment & Plan:  Insulin-requiring type 2 DM, with CAD: overcontrolled Hypoglycemia: This limits aggressiveness of glycemic control Renal insuff: in this setting, lantus is chosen for reduction   Patient Instructions  check your blood sugar twice a day.  vary the time of day when you check, between before the 3 meals, and at bedtime.  also check if you have symptoms of your blood sugar being too high or too low.  please keep a record of the readings and bring it to your next appointment here (or you can bring the meter itself).  You can write it on any piece of paper.  please call us sooner if your blood sugar goes below 70, or if you have a lot of readings over 200.   Please reduce the lantus to 15 units daily, and:  Please continue the same humalog. You should take these numbers, no matter what your blood sugar is.   Please come back for a follow-up appointment in 4 months.

## 2018-08-06 NOTE — Patient Instructions (Addendum)
check your blood sugar twice a day.  vary the time of day when you check, between before the 3 meals, and at bedtime.  also check if you have symptoms of your blood sugar being too high or too low.  please keep a record of the readings and bring it to your next appointment here (or you can bring the meter itself).  You can write it on any piece of paper.  please call us sooner if your blood sugar goes below 70, or if you have a lot of readings over 200.   Please reduce the lantus to 15 units daily, and:  Please continue the same humalog. You should take these numbers, no matter what your blood sugar is.   Please come back for a follow-up appointment in 4 months.

## 2018-08-13 DIAGNOSIS — Z794 Long term (current) use of insulin: Secondary | ICD-10-CM | POA: Diagnosis not present

## 2018-08-13 DIAGNOSIS — E1159 Type 2 diabetes mellitus with other circulatory complications: Secondary | ICD-10-CM | POA: Diagnosis not present

## 2018-08-13 DIAGNOSIS — I25118 Atherosclerotic heart disease of native coronary artery with other forms of angina pectoris: Secondary | ICD-10-CM | POA: Diagnosis not present

## 2018-08-13 DIAGNOSIS — I5022 Chronic systolic (congestive) heart failure: Secondary | ICD-10-CM | POA: Diagnosis not present

## 2018-08-30 ENCOUNTER — Other Ambulatory Visit: Payer: Self-pay | Admitting: Internal Medicine

## 2018-08-30 DIAGNOSIS — Z4502 Encounter for adjustment and management of automatic implantable cardiac defibrillator: Secondary | ICD-10-CM | POA: Diagnosis not present

## 2018-08-30 DIAGNOSIS — I5022 Chronic systolic (congestive) heart failure: Secondary | ICD-10-CM | POA: Diagnosis not present

## 2018-09-20 DIAGNOSIS — E785 Hyperlipidemia, unspecified: Secondary | ICD-10-CM | POA: Diagnosis not present

## 2018-09-20 DIAGNOSIS — I5022 Chronic systolic (congestive) heart failure: Secondary | ICD-10-CM | POA: Diagnosis not present

## 2018-09-20 DIAGNOSIS — Z794 Long term (current) use of insulin: Secondary | ICD-10-CM | POA: Diagnosis not present

## 2018-09-20 DIAGNOSIS — E1159 Type 2 diabetes mellitus with other circulatory complications: Secondary | ICD-10-CM | POA: Diagnosis not present

## 2018-10-01 ENCOUNTER — Other Ambulatory Visit: Payer: Self-pay | Admitting: Internal Medicine

## 2018-10-01 NOTE — Telephone Encounter (Signed)
Ok ambien 30 days only per refill policy  Please let pt know he is due for ROV

## 2018-10-08 ENCOUNTER — Other Ambulatory Visit: Payer: Self-pay | Admitting: Internal Medicine

## 2018-10-08 MED ORDER — ZOLPIDEM TARTRATE 5 MG PO TABS: ORAL_TABLET | ORAL | 0 refills | Status: DC

## 2018-10-23 ENCOUNTER — Encounter: Payer: Medicare Other | Admitting: Internal Medicine

## 2018-10-24 ENCOUNTER — Other Ambulatory Visit: Payer: Self-pay | Admitting: Endocrinology

## 2018-10-26 DIAGNOSIS — Z4502 Encounter for adjustment and management of automatic implantable cardiac defibrillator: Secondary | ICD-10-CM | POA: Diagnosis not present

## 2018-10-26 DIAGNOSIS — Z950 Presence of cardiac pacemaker: Secondary | ICD-10-CM | POA: Diagnosis not present

## 2018-10-26 DIAGNOSIS — I2510 Atherosclerotic heart disease of native coronary artery without angina pectoris: Secondary | ICD-10-CM | POA: Diagnosis not present

## 2018-10-26 DIAGNOSIS — I252 Old myocardial infarction: Secondary | ICD-10-CM | POA: Diagnosis not present

## 2018-10-26 DIAGNOSIS — Z79899 Other long term (current) drug therapy: Secondary | ICD-10-CM | POA: Diagnosis not present

## 2018-10-26 DIAGNOSIS — I4819 Other persistent atrial fibrillation: Secondary | ICD-10-CM | POA: Diagnosis not present

## 2018-10-26 DIAGNOSIS — E119 Type 2 diabetes mellitus without complications: Secondary | ICD-10-CM | POA: Diagnosis not present

## 2018-10-26 DIAGNOSIS — Z6827 Body mass index (BMI) 27.0-27.9, adult: Secondary | ICD-10-CM | POA: Diagnosis not present

## 2018-10-26 DIAGNOSIS — Z794 Long term (current) use of insulin: Secondary | ICD-10-CM | POA: Diagnosis not present

## 2018-10-26 DIAGNOSIS — I110 Hypertensive heart disease with heart failure: Secondary | ICD-10-CM | POA: Diagnosis not present

## 2018-10-26 DIAGNOSIS — I5022 Chronic systolic (congestive) heart failure: Secondary | ICD-10-CM | POA: Diagnosis not present

## 2018-10-26 DIAGNOSIS — I255 Ischemic cardiomyopathy: Secondary | ICD-10-CM | POA: Diagnosis not present

## 2018-10-26 DIAGNOSIS — E785 Hyperlipidemia, unspecified: Secondary | ICD-10-CM | POA: Diagnosis not present

## 2018-11-08 ENCOUNTER — Other Ambulatory Visit: Payer: Self-pay | Admitting: Endocrinology

## 2018-11-13 ENCOUNTER — Other Ambulatory Visit: Payer: Self-pay | Admitting: Internal Medicine

## 2018-11-28 ENCOUNTER — Telehealth: Payer: Self-pay | Admitting: Endocrinology

## 2018-11-28 NOTE — Telephone Encounter (Signed)
Patient called and has advised that he had left his Lantus in his truck (fell under the seat).  He did place it into the refrigerator once he found it.  He started using one of the Lantus pens and since then his blood sugars have been consistently in the 200's.  Wants to know how to proceed? Please advise at [phone]

## 2018-11-28 NOTE — Telephone Encounter (Signed)
Called pt and made him aware. Verbalized acceptance and understanding. 

## 2018-11-28 NOTE — Telephone Encounter (Signed)
If the insulin was at approx room temp in the truck (weather has been mild recently), it will be good for 1 month.  If so, please increase the insulin to 20 units each morning.

## 2018-11-28 NOTE — Telephone Encounter (Signed)
Please advise. Uncertain if the weather has affected the efficacy of the medication

## 2018-11-29 ENCOUNTER — Other Ambulatory Visit: Payer: Self-pay

## 2018-11-29 DIAGNOSIS — Z4502 Encounter for adjustment and management of automatic implantable cardiac defibrillator: Secondary | ICD-10-CM | POA: Diagnosis not present

## 2018-12-03 ENCOUNTER — Other Ambulatory Visit: Payer: Self-pay

## 2018-12-03 ENCOUNTER — Encounter: Payer: Self-pay | Admitting: Endocrinology

## 2018-12-03 ENCOUNTER — Ambulatory Visit (INDEPENDENT_AMBULATORY_CARE_PROVIDER_SITE_OTHER): Payer: Medicare Other | Admitting: Endocrinology

## 2018-12-03 VITALS — BP 108/70 | HR 75 | Ht 68.75 in | Wt 195.8 lb

## 2018-12-03 DIAGNOSIS — E0800 Diabetes mellitus due to underlying condition with hyperosmolarity without nonketotic hyperglycemic-hyperosmolar coma (NKHHC): Secondary | ICD-10-CM

## 2018-12-03 DIAGNOSIS — E1129 Type 2 diabetes mellitus with other diabetic kidney complication: Secondary | ICD-10-CM

## 2018-12-03 DIAGNOSIS — Z794 Long term (current) use of insulin: Secondary | ICD-10-CM | POA: Diagnosis not present

## 2018-12-03 DIAGNOSIS — E1159 Type 2 diabetes mellitus with other circulatory complications: Secondary | ICD-10-CM

## 2018-12-03 LAB — POCT GLYCOSYLATED HEMOGLOBIN (HGB A1C): Hemoglobin A1C: 6.4 % — AB (ref 4.0–5.6)

## 2018-12-03 MED ORDER — LANTUS SOLOSTAR 100 UNIT/ML ~~LOC~~ SOPN: 20.0000 [IU] | PEN_INJECTOR | SUBCUTANEOUS | 11 refills | Status: DC

## 2018-12-03 NOTE — Patient Instructions (Addendum)
check your blood sugar twice a day.  vary the time of day when you check, between before the 3 meals, and at bedtime.  also check if you have symptoms of your blood sugar being too high or too low.  please keep a record of the readings and bring it to your next appointment here (or you can bring the meter itself).  You can write it on any piece of paper.  please call us sooner if your blood sugar goes below 70, or if you have a lot of readings over 200.   Please reduce the lantus to 20 units each morning, and:  Please continue the same humalog.   You should take these numbers, no matter what your blood sugar is.   Please come back for a follow-up appointment in 3 months.

## 2018-12-03 NOTE — Progress Notes (Signed)
Subjective:    Patient ID: [name], male    DOB: December 07, 1945, 73 y.o.   MRN: [id number]  HPI Pt returns for f/u of diabetes mellitus: DM type: Insulin-requiring type 2.   Dx'ed: [id number] Complications: polyneuropathy, renal insuff, and CAD.   Therapy: insulin since mid-2018, and pioglitizone.  DKA: never Severe hypoglycemia: never.   Pancreatitis: never.  Pancreatic imaging: never Other: He did not tolerate metformin (diarrhea); he declines V-GO, due to cost.  He takes multiple daily injections.   Interval history: continuous glucose monitor is downloaded today, and the printout is scanned into the record.  It varies from 80-235.  It is in general slightly higher as the day goes on, but not necessarily so.  He says he takes Lantus 40 units qam (he increased from 20/d, 1 week ago), and humalog, 5 units 3 times a day (just before each meal).   Past Medical History:  Diagnosis Date  . CALLUS, RIGHT FOOT 12/08/2009  . Cardiac arrest ([place])   . Chronic systolic heart failure ([place]) 11/03/2009  . DIABETES MELLITUS, TYPE II 12/05/2006  . 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 HYPERLIPIDEMIA 11/25/2006  . HYPOGONADISM, MALE 12/05/2006  . Implantable cardiac defibrillator MDT 09/22/2008   Change out feb 2011, 2015  . Ischemic cardiomyopathy 09/22/2008   DES CX and RCA  70% residual LAD  Done in [place]; EF 30%;; myoviewq 06-03-2006 no ischemia  . Kidney stones   . Long term (current) use of anticoagulants 04/25/2010  . Myocardial infarction [facility]) Jun 02, 2004   "I died and they brought me back"  . 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 NEPHROLITHIASIS, HX OF 02/25/2008  . Persistent atrial fibrillation ([place]) 11/25/2006   Inapp shock 2010/06/03 AF VR >220 recurrent    Past Surgical History:  Procedure Laterality Date  . CARDIAC CATHETERIZATION     feburary 06/02/12  . CARDIAC DEFIBRILLATOR PLACEMENT     Guidant Vitality [name]  . CARDIOVERSION N/A 04/25/2012   Procedure: CARDIOVERSION;  Surgeon:  Darlin Coco, MD;  Location[facility] ENDOSCOPY;  Service: Cardiovascular;  Laterality: N/A;  . CARDIOVERSION N/A 05/21/2012   Procedure: CARDIOVERSION;  Surgeon: Deboraha Sprang, MD;  Location: [place];  Service: Cardiovascular;  Laterality: N/A;  . CATARACT EXTRACTION    . EYE SURGERY     cataracts  . IMPLANTABLE CARDIOVERTER DEFIBRILLATOR (ICD) GENERATOR CHANGE N/A 02/05/2014   Procedure: ICD GENERATOR CHANGE;  Surgeon: Deboraha Sprang, MD;  Location: [facility] CATH LAB;  Service: Cardiovascular;  Laterality: N/A;  . PTCA    . TONSILLECTOMY    . TONSILLECTOMY      Social History   Socioeconomic History  . Marital status: Divorced    Spouse name: Not on file  . Number of children: 1  . Years of education: 44  . Highest education level: Not on file  Occupational History  . 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 Financial resource strain: Not on file  . Food insecurity    Worry: Not on file    Inability: Not on file  . Transportation needs    Medical: Not on file    Non-medical: Not on file  Tobacco Use  . Smoking status: Never Smoker  . Smokeless tobacco: Never Used  Substance and Sexual Activity  . Alcohol use: Yes    Alcohol/week: 0.0 standard drinks    Comment: summer   . Drug  use: No  . Sexual activity: Yes    Partners: Female  Lifestyle  . Physical activity    Days per week: Not on file    Minutes per session: Not on file  . Stress: Not on file  Relationships  . Social Herbalist on phone: Not on file    Gets together: Not on file    Attends religious service: Not on file    Active member of club or organization: Not on file    Attends meetings of clubs or organizations: Not on file    Relationship status: Not on file  . Intimate partner violence    Fear of current or ex partner: Not on file    Emotionally abused: Not on file    Physically abused: Not on file    Forced  sexual activity: Not on file  Other Topics Concern  . Not on file  Social History Narrative   HSG, College grad. Married - divorced. 1 daughter. owner/operator electronics business and show production    Current Outpatient Medications on File Prior to Visit  Medication Sig Dispense Refill  . carvedilol (COREG) 12.5 MG tablet Take 1 tablet (12.5 mg total) by mouth 2 (two) times daily with a meal. 180 tablet 5  . citalopram (CELEXA) 10 MG tablet TAKE 1 TABLET BY MOUTH EVERY DAY 90 tablet 0  . Continuous Blood Gluc Receiver (FREESTYLE LIBRE 14 DAY READER) DEVI USE AS DIRECTED FOR 1 DOSE 1 each 0  . Continuous Blood Gluc Sensor (FREESTYLE LIBRE 14 DAY SENSOR) MISC APPLY 1 SENSOR EVERY 14 DAYS 6 each 0  . dofetilide (TIKOSYN) 500 MCG capsule TAKE 1 CAPSULE BY MOUTH 2 TIMES DAILY 60 capsule 0  . fluticasone (FLONASE) 50 MCG/ACT nasal spray USE 2 SPRAYS IN EACH NOSTRIL EVERY DAY 16 g 5  . furosemide (LASIX) 20 MG tablet Take 1 tablet by mouth daily. OVERDUE FOR FOLLOW UP. PLEASE CALL AND SCHEDULE 15 tablet 0  . Glucose Blood (FREESTYLE PRECISION NEO TEST VI) by In Vitro route.    Marland Kitchen glucose blood (ONE TOUCH ULTRA TEST) test strip 1 each by Other route 2 (two) times daily. 200 each 2  . HUMALOG KWIKPEN 100 UNIT/ML KwikPen Inject 0.05 mLs (5 Units total) into the skin 3 (three) times daily with meals. 15 mL 11  . insulin lispro (HUMALOG KWIKPEN) 100 UNIT/ML KiwkPen Inject 5 units into the skin three times daily with meals. 15 mL 11  . Lancets MISC Use as directed up to 4 times per day 100 each 11  . liraglutide (VICTOZA) 18 MG/3ML SOPN Inject 1.2 mg into the skin.    Marland Kitchen lisinopril (PRINIVIL,ZESTRIL) 5 MG tablet Take 5 mg by mouth daily.    Marland Kitchen loratadine (CLARITIN) 10 MG tablet Take 10 mg by mouth daily.    Marland Kitchen LORazepam (ATIVAN) 1 MG tablet TAKE 1/2 TABLET BY MOUTH EVERY DAY AS NEEDED FOR ANXIETY 45 tablet 1  . Magnesium Oxide (MAG-OX 400 PO) Take 1 tablet by mouth 2 (two) times daily.    . Multiple  Vitamins-Minerals (MULTIVITAMIN PO) Take 1 tablet by mouth daily.    Marland Kitchen NITROSTAT 0.4 MG SL tablet Place 1 tablet (0.4 mg total) under the tongue every 5 (five) minutes as needed for chest pain. 25 tablet 5  . ranolazine (RANEXA) 500 MG 12 hr tablet Take 1 tablet (500 mg total) by mouth 2 (two) times daily. 60 tablet 6  . rosuvastatin (CRESTOR) 20 MG tablet TAKE 1  TABLET BY MOUTH EVERY DAY 90 tablet 0  . sacubitril-valsartan (ENTRESTO) 24-26 MG Take 1 tablet by mouth 2 (two) times daily.    . sucralfate (CARAFATE) 1 g tablet Take 1 g by mouth 4 (four) times daily -  with meals and at bedtime.    Marland Kitchen ULTICARE MICRO PEN NEEDLES 32G X 4 MM MISC USE DAILY AS DIRECTED 100 each 3  . XARELTO 20 MG TABS tablet TAKE 1 TABLET BY MOUTH EVERY DAY 30 tablet 5  . zolpidem (AMBIEN) 5 MG tablet TAKE 1 TABLET BY MOUTH NIGHTLY AT BEDTIME AS NEEDED 30 tablet 0  . pioglitazone (ACTOS) 30 MG tablet TAKE 1 TABLET BY MOUTH EVERY DAY (Patient not taking: Reported on 12/03/2018) 90 tablet 2   No current facility-administered medications on file prior to visit.     Allergies  Allergen Reactions  . Salmon [Fish Allergy] Other (See Comments)    Only canned/ not fresh (probably preservatives).   . Sulfa Antibiotics     Not sure has allergyu to this Only canned/ not fresh (probably preservatives).   . Metformin And Related Other (See Comments)    diarrhea    Family History  Problem Relation Age of Onset  . Diabetes Maternal Grandmother   . Hyperlipidemia Maternal Grandmother   . Breast cancer Mother   . Cancer Neg Hx   . COPD Neg Hx   . Heart disease Neg Hx     BP 108/70 (BP Location: Left Arm, Patient Position: Sitting, Cuff Size: Normal)   Pulse 75   Ht 5' 8.75" (1.746 m)   Wt 195 lb 12.8 oz (88.8 kg)   SpO2 96%   BMI 29.13 kg/m    Review of Systems He denies hypoglycemia.      Objective:   Physical Exam VITAL SIGNS:  See vs page GENERAL: no distress Pulses: dorsalis pedis intact bilat.   MSK: no  deformity of the feet CV: no leg edema Skin:  no ulcer on the feet.  normal color and temp on the feet. Neuro: sensation is intact to touch on the feet, but decreased from normal.    Lab Results  Component Value Date   HGBA1C 6.4 (A) 12/03/2018   Lab Results  Component Value Date   CREATININE 1.29 10/17/2017   BUN 27 (H) 10/17/2017   NA 136 10/17/2017   K 4.7 10/17/2017   CL 98 10/17/2017   CO2 28 10/17/2017      Assessment & Plan:  Insulin-requiring type 2 DM, with CAD: overcontrolled Renal insuff: This limits rx options  Patient Instructions  check your blood sugar twice a day.  vary the time of day when you check, between before the 3 meals, and at bedtime.  also check if you have symptoms of your blood sugar being too high or too low.  please keep a record of the readings and bring it to your next appointment here (or you can bring the meter itself).  You can write it on any piece of paper.  please call us sooner if your blood sugar goes below 70, or if you have a lot of readings over 200.   Please reduce the lantus to 20 units each morning, and:  Please continue the same humalog.   You should take these numbers, no matter what your blood sugar is.   Please come back for a follow-up appointment in 3 months.

## 2018-12-12 ENCOUNTER — Other Ambulatory Visit: Payer: Self-pay | Admitting: Internal Medicine

## 2018-12-12 NOTE — Telephone Encounter (Signed)
Done erx 

## 2018-12-24 ENCOUNTER — Other Ambulatory Visit: Payer: Self-pay | Admitting: Internal Medicine

## 2018-12-24 DIAGNOSIS — Z6827 Body mass index (BMI) 27.0-27.9, adult: Secondary | ICD-10-CM | POA: Diagnosis not present

## 2018-12-24 DIAGNOSIS — G4700 Insomnia, unspecified: Secondary | ICD-10-CM | POA: Diagnosis not present

## 2018-12-24 DIAGNOSIS — T7840XA Allergy, unspecified, initial encounter: Secondary | ICD-10-CM | POA: Diagnosis not present

## 2018-12-24 DIAGNOSIS — I5022 Chronic systolic (congestive) heart failure: Secondary | ICD-10-CM | POA: Diagnosis not present

## 2018-12-25 MED ORDER — ZOLPIDEM TARTRATE 5 MG PO TABS: ORAL_TABLET | ORAL | 0 refills | Status: DC

## 2018-12-25 NOTE — Telephone Encounter (Signed)
Please to let pt know - I am unable to refill any further medications after this due to office policy on refills

## 2018-12-26 ENCOUNTER — Other Ambulatory Visit: Payer: Self-pay | Admitting: Internal Medicine

## 2018-12-26 DIAGNOSIS — I5022 Chronic systolic (congestive) heart failure: Secondary | ICD-10-CM

## 2019-01-03 ENCOUNTER — Telehealth: Payer: Self-pay | Admitting: Emergency Medicine

## 2019-01-03 ENCOUNTER — Other Ambulatory Visit (INDEPENDENT_AMBULATORY_CARE_PROVIDER_SITE_OTHER): Payer: Medicare Other

## 2019-01-03 DIAGNOSIS — I5022 Chronic systolic (congestive) heart failure: Secondary | ICD-10-CM

## 2019-01-03 LAB — BASIC METABOLIC PANEL
BUN: 23 mg/dL (ref 6–23)
CO2: 27 mEq/L (ref 19–32)
Calcium: 9.6 mg/dL (ref 8.4–10.5)
Chloride: 99 mEq/L (ref 96–112)
Creatinine, Ser: 1.16 mg/dL (ref 0.40–1.50)
GFR: 62.03 mL/min (ref >60.00)
Glucose, Bld: 163 mg/dL — ABNORMAL HIGH (ref 70–99)
Potassium: 4.4 mEq/L (ref 3.5–5.1)
Sodium: 134 mEq/L — ABNORMAL LOW (ref 135–145)

## 2019-01-03 LAB — BRAIN NATRIURETIC PEPTIDE: Pro B Natriuretic peptide (BNP): 128 pg/mL — ABNORMAL HIGH (ref 0.0–100.0)

## 2019-01-03 NOTE — Telephone Encounter (Signed)
Pt is request that his labs results be faxed over to his Cardiologist. Attention Tammy Long [phone]. Thanks.

## 2019-01-03 NOTE — Telephone Encounter (Signed)
Noted  

## 2019-01-07 NOTE — Telephone Encounter (Signed)
Results have been faxed

## 2019-01-07 NOTE — Telephone Encounter (Signed)
Pt called to f/up on this request.  Pt stated labs have not been received by his cardiologist and they will not refill his meds until they receive them.  Pt states he will be out of medication tomorrow.  Please advise.

## 2019-02-07 ENCOUNTER — Other Ambulatory Visit: Payer: Self-pay | Admitting: Internal Medicine

## 2019-02-25 ENCOUNTER — Telehealth: Payer: Self-pay

## 2019-02-25 NOTE — Telephone Encounter (Signed)
Patient called in wanting to let Dr know his sugars have been high for the last few days and wants to know what to do     Please call and advise

## 2019-02-25 NOTE — Telephone Encounter (Signed)
Pt states his BS has been over 200 randomly over the last month or so-he is only in target BS range 22% of the time.  High average is 175 in the pm 3-6 am average is 170

## 2019-02-25 NOTE — Telephone Encounter (Signed)
Please increase humalog to 8 units 3 times a day (just before each meal) Please continue the same lantus

## 2019-02-26 NOTE — Telephone Encounter (Signed)
Called pt and gave him MD message. Pt verbalized understanding. 

## 2019-02-28 DIAGNOSIS — Z4502 Encounter for adjustment and management of automatic implantable cardiac defibrillator: Secondary | ICD-10-CM | POA: Diagnosis not present

## 2019-03-02 ENCOUNTER — Other Ambulatory Visit: Payer: Self-pay | Admitting: Endocrinology

## 2019-03-05 ENCOUNTER — Other Ambulatory Visit: Payer: Self-pay

## 2019-03-06 ENCOUNTER — Ambulatory Visit: Payer: Medicare Other | Admitting: Endocrinology

## 2019-03-06 ENCOUNTER — Encounter: Payer: Self-pay | Admitting: Endocrinology

## 2019-03-06 VITALS — BP 102/62 | HR 82 | Ht 68.75 in | Wt 196.2 lb

## 2019-03-06 DIAGNOSIS — E1159 Type 2 diabetes mellitus with other circulatory complications: Secondary | ICD-10-CM

## 2019-03-06 DIAGNOSIS — E1129 Type 2 diabetes mellitus with other diabetic kidney complication: Secondary | ICD-10-CM

## 2019-03-06 DIAGNOSIS — E0800 Diabetes mellitus due to underlying condition with hyperosmolarity without nonketotic hyperglycemic-hyperosmolar coma (NKHHC): Secondary | ICD-10-CM

## 2019-03-06 LAB — POCT GLYCOSYLATED HEMOGLOBIN (HGB A1C): Hemoglobin A1C: 7.2 % — AB (ref 4.0–5.6)

## 2019-03-06 MED ORDER — LANTUS SOLOSTAR 100 UNIT/ML ~~LOC~~ SOPN: 15.0000 [IU] | PEN_INJECTOR | SUBCUTANEOUS | 11 refills | Status: DC

## 2019-03-06 MED ORDER — INSULIN LISPRO (1 UNIT DIAL) 100 UNIT/ML (KWIKPEN): 7.0000 [IU] | PEN_INJECTOR | Freq: Three times a day (TID) | SUBCUTANEOUS | 11 refills | Status: DC

## 2019-03-06 NOTE — Patient Instructions (Addendum)
check your blood sugar twice a day.  vary the time of day when you check, between before the 3 meals, and at bedtime.  also check if you have symptoms of your blood sugar being too high or too low.  please keep a record of the readings and bring it to your next appointment here (or you can bring the meter itself).  You can write it on any piece of paper.  please call us sooner if your blood sugar goes below 70, or if you have a lot of readings over 200.   Please continue the same insulins.   Please come back for a follow-up appointment in 3 months.      

## 2019-03-06 NOTE — Progress Notes (Signed)
Subjective:    Patient ID: [name], male    DOB: 1945/06/06, 74 y.o.   MRN: [id number]  HPI Pt returns for f/u of diabetes mellitus: DM type: Insulin-requiring type 2.   Dx'ed: [id number] Complications: polyneuropathy, renal insuff, and CAD.   Therapy: insulin since mid-2018, and pioglitizone.  DKA: never Severe hypoglycemia: never.   Pancreatitis: never.  Pancreatic imaging: never Other: He did not tolerate metformin (diarrhea); he declines V-GO, due to cost.  He takes multiple daily injections.   Interval history: continuous glucose monitor is downloaded today, and the printout is scanned into the record.  It varies from 80-270.  It is in general slightly higher as the day goes on, but not necessarily so.  He says he takes Lantus 15 units qam, and humalog, 7 units 3 times a day (just before each meal).   Past Medical History:  Diagnosis Date  . CALLUS, RIGHT FOOT 12/08/2009  . Cardiac arrest ([place])   . Chronic systolic heart failure ([place]) 11/03/2009  . DIABETES MELLITUS, TYPE II 12/05/2006  . 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 HYPERLIPIDEMIA 11/25/2006  . HYPOGONADISM, MALE 12/05/2006  . Implantable cardiac defibrillator MDT 09/22/2008   Change out feb 2011, 2015  . Ischemic cardiomyopathy 09/22/2008   DES CX and RCA  70% residual LAD  Done in [place]; EF 30%;; myoviewq 2006/05/25 no ischemia  . Kidney stones   . Long term (current) use of anticoagulants 04/25/2010  . Myocardial infarction [facility]) 05/24/2004   "I died and they brought me back"  . 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 NEPHROLITHIASIS, HX OF 02/25/2008  . Persistent atrial fibrillation ([place]) 11/25/2006   Inapp shock 05/25/10 AF VR >220 recurrent    Past Surgical History:  Procedure Laterality Date  . CARDIAC CATHETERIZATION     feburary May 24, 2012  . CARDIAC DEFIBRILLATOR PLACEMENT     Guidant Vitality [name]  . CARDIOVERSION N/A 04/25/2012   Procedure: CARDIOVERSION;  Surgeon: Darlin Coco, MD;  Location: [facility]  ENDOSCOPY;  Service: Cardiovascular;  Laterality: N/A;  . CARDIOVERSION N/A 05/21/2012   Procedure: CARDIOVERSION;  Surgeon: Deboraha Sprang, MD;  Location: [place];  Service: Cardiovascular;  Laterality: N/A;  . CATARACT EXTRACTION    . EYE SURGERY     cataracts  . IMPLANTABLE CARDIOVERTER DEFIBRILLATOR (ICD) GENERATOR CHANGE N/A 02/05/2014   Procedure: ICD GENERATOR CHANGE;  Surgeon: Deboraha Sprang, MD;  Location: [facility] C[facility] LAB;  Service: Cardiovascular;  Laterality: N/A;  . PTCA    . TONSILLECTOMY    . TONSILLECTOMY      Social History   Socioeconomic History  . Marital status: Divorced    Spouse name: Not on file  . Number of children: 1  . Years of education: 39  . Highest education level: Not on file  Occupational History  . 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 Smoking status: Never Smoker  . Smokeless tobacco: Never Used  Substance and Sexual Activity  . Alcohol use: Yes    Alcohol/week: 0.0 standard drinks    Comment: summer   . Drug use: No  . Sexual activity: Yes    Partners: Female  Other Topics Concern  . Not on file  Social History Narrative   HSG, College grad. Married - divorced. 1 daughter. owner/operator Research officer, trade union business and show production   Social Determinants of Radio broadcast assistant Strain:   .  Difficulty of Paying Living Expenses: Not on file  Food Insecurity:   . Worried About Charity fundraiser in the Last Year: Not on file  . Ran Out of Food in the Last Year: Not on file  Transportation Needs:   . Lack of Transportation (Medical): Not on file  . Lack of Transportation (Non-Medical): Not on file  Physical Activity:   . Days of Exercise per Week: Not on file  . Minutes of Exercise per Session: Not on file  Stress:   . Feeling of Stress : Not on file  Social Connections:   . Frequency of Communication with Friends and Family: Not on file  . Frequency  of Social Gatherings with Friends and Family: Not on file  . Attends Religious Services: Not on file  . Active Member of Clubs or Organizations: Not on file  . Attends Archivist Meetings: Not on file  . Marital Status: Not on file  Intimate Partner Violence:   . Fear of Current or Ex-Partner: Not on file  . Emotionally Abused: Not on file  . Physically Abused: Not on file  . Sexually Abused: Not on file    Current Outpatient Medications on File Prior to Visit  Medication Sig Dispense Refill  . carvedilol (COREG) 12.5 MG tablet Take 1 tablet (12.5 mg total) by mouth 2 (two) times daily with a meal. 180 tablet 5  . citalopram (CELEXA) 10 MG tablet TAKE 1 TABLET BY MOUTH EVERY DAY 90 tablet 0  . Continuous Blood Gluc Receiver (FREESTYLE LIBRE 14 DAY READER) DEVI USE AS DIRECTED FOR 1 DOSE 1 each 0  . Continuous Blood Gluc Sensor (FREESTYLE LIBRE 14 DAY SENSOR) MISC APPLY 1 sensor EVERY 14 DAYS 6 each 3  . dofetilide (TIKOSYN) 500 MCG capsule TAKE 1 CAPSULE BY MOUTH 2 TIMES DAILY 60 capsule 0  . fluticasone (FLONASE) 50 MCG/ACT nasal spray USE 2 SPRAYS IN EACH NOSTRIL EVERY DAY 16 g 5  . furosemide (LASIX) 20 MG tablet Take 1 tablet by mouth daily. OVERDUE FOR FOLLOW UP. PLEASE CALL AND SCHEDULE 15 tablet 0  . Glucose Blood (FREESTYLE PRECISION NEO TEST VI) by In Vitro route.    Marland Kitchen glucose blood (ONE TOUCH ULTRA TEST) test strip 1 each by Other route 2 (two) times daily. 200 each 2  . Lancets MISC Use as directed up to 4 times per day 100 each 11  . liraglutide (VICTOZA) 18 MG/3ML SOPN Inject 1.8 mg into the skin.     Marland Kitchen lisinopril (PRINIVIL,ZESTRIL) 5 MG tablet Take 5 mg by mouth daily.    Marland Kitchen loratadine (CLARITIN) 10 MG tablet Take 10 mg by mouth daily.    Marland Kitchen LORazepam (ATIVAN) 1 MG tablet TAKE 1/2 TABLET BY MOUTH EVERY DAY AS NEEDED FOR ANXIETY 45 tablet 1  . Magnesium Oxide (MAG-OX 400 PO) Take 1 tablet by mouth 2 (two) times daily.    . Multiple Vitamins-Minerals (MULTIVITAMIN PO)  Take 1 tablet by mouth daily.    Marland Kitchen NITROSTAT 0.4 MG SL tablet Place 1 tablet (0.4 mg total) under the tongue every 5 (five) minutes as needed for chest pain. 25 tablet 5  . pioglitazone (ACTOS) 30 MG tablet TAKE 1 TABLET BY MOUTH EVERY DAY 90 tablet 2  . ranolazine (RANEXA) 500 MG 12 hr tablet Take 1 tablet (500 mg total) by mouth 2 (two) times daily. 60 tablet 6  . rosuvastatin (CRESTOR) 20 MG tablet TAKE 1 TABLET BY MOUTH EVERY DAY 90 tablet 0  .  sacubitril-valsartan (ENTRESTO) 24-26 MG Take 1 tablet by mouth 2 (two) times daily.    . sucralfate (CARAFATE) 1 g tablet Take 1 g by mouth 4 (four) times daily -  with meals and at bedtime.    Marland Kitchen ULTICARE MICRO PEN NEEDLES 32G X 4 MM MISC USE DAILY AS DIRECTED 100 each 3  . XARELTO 20 MG TABS tablet TAKE 1 TABLET BY MOUTH EVERY DAY 30 tablet 5  . zolpidem (AMBIEN) 5 MG tablet TAKE 1 OR 2 TABLETS BY MOUTH NIGHTLY AT BEDTIME AS NEEDED 30 tablet 0   No current facility-administered medications on file prior to visit.    Allergies  Allergen Reactions  . Salmon [Fish Allergy] Other (See Comments)    Only canned/ not fresh (probably preservatives).   . Sulfa Antibiotics     Not sure has allergyu to this Only canned/ not fresh (probably preservatives).   . Metformin And Related Other (See Comments)    diarrhea    Family History  Problem Relation Age of Onset  . Diabetes Maternal Grandmother   . Hyperlipidemia Maternal Grandmother   . Breast cancer Mother   . Cancer Neg Hx   . COPD Neg Hx   . Heart disease Neg Hx     BP 102/62 (BP Location: Right Arm, Patient Position: Sitting, Cuff Size: Normal)   Pulse 82   Ht 5' 8.75" (1.746 m)   Wt 196 lb 3.2 oz (89 kg)   SpO2 95%   BMI 29.18 kg/m    Review of Systems He denies hypoglycemia.      Objective:   Physical Exam VITAL SIGNS:  See vs page GENERAL: no distress Pulses: dorsalis pedis intact bilat.   MSK: no deformity of the feet CV: no leg edema Skin:  no ulcer on the feet, but  there are heavy calluses.  normal color and temp on the feet.   Neuro: sensation is intact to touch on the feet, but decreased from normal.   Ext: there is bilateral onychomycosis of the toenails.    Lab Results  Component Value Date   HGBA1C 7.2 (A) 03/06/2019   Lab Results  Component Value Date   CREATININE 1.16 01/03/2019   BUN 23 01/03/2019   NA 134 (L) 01/03/2019   K 4.4 01/03/2019   CL 99 01/03/2019   CO2 27 01/03/2019      Assessment & Plan:  Insulin-requiring type 2 DM, with CAD: well-controlled Renal insuff: in this setting, he needs mostly mealtime insulin   Patient Instructions  check your blood sugar twice a day.  vary the time of day when you check, between before the 3 meals, and at bedtime.  also check if you have symptoms of your blood sugar being too high or too low.  please keep a record of the readings and bring it to your next appointment here (or you can bring the meter itself).  You can write it on any piece of paper.  please call us sooner if your blood sugar goes below 70, or if you have a lot of readings over 200.   Please continue the same insulins   Please come back for a follow-up appointment in 3 months.

## 2019-03-25 DIAGNOSIS — Z6827 Body mass index (BMI) 27.0-27.9, adult: Secondary | ICD-10-CM | POA: Diagnosis not present

## 2019-03-25 DIAGNOSIS — I5020 Unspecified systolic (congestive) heart failure: Secondary | ICD-10-CM | POA: Diagnosis not present

## 2019-03-25 DIAGNOSIS — Z794 Long term (current) use of insulin: Secondary | ICD-10-CM | POA: Diagnosis not present

## 2019-03-25 DIAGNOSIS — E1159 Type 2 diabetes mellitus with other circulatory complications: Secondary | ICD-10-CM | POA: Diagnosis not present

## 2019-03-26 DIAGNOSIS — N62 Hypertrophy of breast: Secondary | ICD-10-CM | POA: Insufficient documentation

## 2019-03-26 DIAGNOSIS — N644 Mastodynia: Secondary | ICD-10-CM | POA: Insufficient documentation

## 2019-04-17 DIAGNOSIS — Z882 Allergy status to sulfonamides status: Secondary | ICD-10-CM | POA: Diagnosis not present

## 2019-04-17 DIAGNOSIS — I4901 Ventricular fibrillation: Secondary | ICD-10-CM | POA: Diagnosis not present

## 2019-04-17 DIAGNOSIS — E119 Type 2 diabetes mellitus without complications: Secondary | ICD-10-CM | POA: Diagnosis not present

## 2019-04-17 DIAGNOSIS — I255 Ischemic cardiomyopathy: Secondary | ICD-10-CM | POA: Diagnosis not present

## 2019-04-17 DIAGNOSIS — I10 Essential (primary) hypertension: Secondary | ICD-10-CM | POA: Diagnosis not present

## 2019-04-17 DIAGNOSIS — Z6827 Body mass index (BMI) 27.0-27.9, adult: Secondary | ICD-10-CM | POA: Diagnosis not present

## 2019-04-17 DIAGNOSIS — Z955 Presence of coronary angioplasty implant and graft: Secondary | ICD-10-CM | POA: Diagnosis not present

## 2019-04-17 DIAGNOSIS — I252 Old myocardial infarction: Secondary | ICD-10-CM | POA: Diagnosis not present

## 2019-04-17 DIAGNOSIS — E785 Hyperlipidemia, unspecified: Secondary | ICD-10-CM | POA: Diagnosis not present

## 2019-04-17 DIAGNOSIS — I2510 Atherosclerotic heart disease of native coronary artery without angina pectoris: Secondary | ICD-10-CM | POA: Diagnosis not present

## 2019-04-17 DIAGNOSIS — I5022 Chronic systolic (congestive) heart failure: Secondary | ICD-10-CM | POA: Diagnosis not present

## 2019-04-17 DIAGNOSIS — I110 Hypertensive heart disease with heart failure: Secondary | ICD-10-CM | POA: Diagnosis not present

## 2019-04-17 DIAGNOSIS — Z95810 Presence of automatic (implantable) cardiac defibrillator: Secondary | ICD-10-CM | POA: Diagnosis not present

## 2019-04-17 DIAGNOSIS — I4819 Other persistent atrial fibrillation: Secondary | ICD-10-CM | POA: Diagnosis not present

## 2019-04-23 ENCOUNTER — Encounter: Payer: Self-pay | Admitting: Endocrinology

## 2019-04-24 ENCOUNTER — Telehealth: Payer: Self-pay

## 2019-04-24 ENCOUNTER — Other Ambulatory Visit: Payer: Self-pay

## 2019-04-24 DIAGNOSIS — E0800 Diabetes mellitus due to underlying condition with hyperosmolarity without nonketotic hyperglycemic-hyperosmolar coma (NKHHC): Secondary | ICD-10-CM

## 2019-04-24 MED ORDER — NOVOLOG FLEXPEN 100 UNIT/ML ~~LOC~~ SOPN: 7.0000 [IU] | PEN_INJECTOR | Freq: Three times a day (TID) | SUBCUTANEOUS | 0 refills | Status: DC

## 2019-04-24 NOTE — Telephone Encounter (Signed)
PRIOR AUTHORIZATION  PA initiation date: 04/24/19   Insurance Company: [place] completed electronically through [employer] My Meds: Yes  Will await insurance response re: approval/denial.  Baird Lyons JR (Key: [id number])  Your information has been submitted to [place]. Blue Cross [place] will review the request and notify you of the determination decision directly, typically within 3 business days of your submission and once all necessary information is received.  You will also receive your request decision electronically. To check for an update later, open the request again from your dashboard.  If [employer] [place] has not responded within the specified timeframe or if you have any questions about your PA submission, contact [place] directly at [facility]) [phone] or ([place]) [phone].

## 2019-04-24 NOTE — Telephone Encounter (Signed)
DENIAL  Received notification from [place] that [place] for [place] has been denied. Documents have been labeled and placed in scan file for HIM and for our future reference.  Following message sent to pt via My Chart:  [name],  We received notification from your insurance company and [facility] that a prior authorization would be required for Novolog. However, once we completed the prior authorization, your insurance responded with a denial. I have provided Dr. Loanne Drilling with this denial for him to review and address. He has responded as follows:  - Please remain on Humalog in vial form and please purchase from [facility] (over the counter) at $24.99 per vial.   A new prescription can be [facility] but we are needing to know which [facility] you would prefer. We look forward to hearing from you soon.  Thank you,  [facility]

## 2019-05-14 ENCOUNTER — Telehealth: Payer: Self-pay

## 2019-05-14 ENCOUNTER — Other Ambulatory Visit: Payer: Self-pay

## 2019-05-14 DIAGNOSIS — E0800 Diabetes mellitus due to underlying condition with hyperosmolarity without nonketotic hyperglycemic-hyperosmolar coma (NKHHC): Secondary | ICD-10-CM

## 2019-05-14 MED ORDER — INSULIN LISPRO (1 UNIT DIAL) 100 UNIT/ML (KWIKPEN): 7.0000 [IU] | PEN_INJECTOR | Freq: Three times a day (TID) | SUBCUTANEOUS | 0 refills | Status: DC

## 2019-05-14 NOTE — Telephone Encounter (Signed)
PRIOR AUTHORIZATION  PA initiation date: 05/14/19  Medication: Humalog [place]: [place] completed electronically through [employer] My Meds: Yes  Will await insurance response re: approval/denial.  [name] (Key: [id number])  Your information has been submitted to [place]. Blue Cross [place] will review the request and notify you of the determination decision directly, typically within 3 business days of your submission and once all necessary information is received.  You will also receive your request decision electronically. To check for an update later, open the request again from your dashboard.  If [employer] [place] has not responded within the specified timeframe or if you have any questions about your PA submission, contact [place] directly at [facility]) [phone] or ([place]) [phone].  [address] [name] #: [phone] Need help? Call us at [phone] Status Sent to [facility] Insulin Lispro (1 Unit Dial) 100UNIT/ML pen-injectors Form [employer] [place] Medicare Part D General Authorization Form Original Claim Info [phone] NON-FORMULARY PRODUCTREFILL PAYABLE ON OR AFTER 05-31-19 +

## 2019-05-14 NOTE — Telephone Encounter (Signed)
APPROVAL  Medication: Humalog [place]: BCBS [place] response: APPROVED Approval dates: 05/14/19 through 05/13/20  Documents have been labeled and placed in scan file for HIM and for our future reference.

## 2019-05-20 ENCOUNTER — Other Ambulatory Visit: Payer: Self-pay | Admitting: Internal Medicine

## 2019-05-30 DIAGNOSIS — Z6824 Body mass index (BMI) 24.0-24.9, adult: Secondary | ICD-10-CM | POA: Diagnosis not present

## 2019-05-30 DIAGNOSIS — Z4502 Encounter for adjustment and management of automatic implantable cardiac defibrillator: Secondary | ICD-10-CM | POA: Diagnosis not present

## 2019-05-30 DIAGNOSIS — R0789 Other chest pain: Secondary | ICD-10-CM | POA: Diagnosis not present

## 2019-06-03 ENCOUNTER — Other Ambulatory Visit: Payer: Self-pay

## 2019-06-05 ENCOUNTER — Other Ambulatory Visit: Payer: Self-pay

## 2019-06-05 ENCOUNTER — Encounter: Payer: Self-pay | Admitting: Endocrinology

## 2019-06-05 ENCOUNTER — Ambulatory Visit: Payer: Medicare Other | Admitting: Endocrinology

## 2019-06-05 VITALS — BP 100/70 | HR 84 | Ht 68.75 in | Wt 194.0 lb

## 2019-06-05 DIAGNOSIS — E1159 Type 2 diabetes mellitus with other circulatory complications: Secondary | ICD-10-CM

## 2019-06-05 DIAGNOSIS — E669 Obesity, unspecified: Secondary | ICD-10-CM | POA: Diagnosis not present

## 2019-06-05 DIAGNOSIS — Z794 Long term (current) use of insulin: Secondary | ICD-10-CM

## 2019-06-05 DIAGNOSIS — E0800 Diabetes mellitus due to underlying condition with hyperosmolarity without nonketotic hyperglycemic-hyperosmolar coma (NKHHC): Secondary | ICD-10-CM

## 2019-06-05 LAB — POCT GLYCOSYLATED HEMOGLOBIN (HGB A1C): Hemoglobin A1C: 6.8 % — AB (ref 4.0–5.6)

## 2019-06-05 MED ORDER — LANTUS SOLOSTAR 100 UNIT/ML ~~LOC~~ SOPN: 12.0000 [IU] | PEN_INJECTOR | SUBCUTANEOUS | 11 refills | Status: DC

## 2019-06-05 MED ORDER — INSULIN LISPRO (1 UNIT DIAL) 100 UNIT/ML (KWIKPEN): 8.0000 [IU] | PEN_INJECTOR | Freq: Three times a day (TID) | SUBCUTANEOUS | 11 refills | Status: DC

## 2019-06-05 NOTE — Patient Instructions (Addendum)
check your blood sugar twice a day.  vary the time of day when you check, between before the 3 meals, and at bedtime.  also check if you have symptoms of your blood sugar being too high or too low.  please keep a record of the readings and bring it to your next appointment here (or you can bring the meter itself).  You can write it on any piece of paper.  please call us sooner if your blood sugar goes below 70, or if you have a lot of readings over 200.   Please change the insulins to the numbers listed below.   Please come back for a follow-up appointment in 3 months.    

## 2019-06-05 NOTE — Progress Notes (Signed)
Subjective:    Patient ID: [name], male    DOB: 1945/11/17, 74 y.o.   MRN: [id number]  HPI Pt returns for f/u of diabetes mellitus: DM type: Insulin-requiring type 2.   Dx'ed: [id number] Complications: polyneuropathy, renal insuff, and CAD.   Therapy: insulin since mid-2018, Victoza, and pioglitizone.  DKA: never Severe hypoglycemia: never.   Pancreatitis: never.  Pancreatic imaging: never Other: He did not tolerate metformin (diarrhea); he declines V-GO, due to cost.  He takes multiple daily injections.   Interval history: continuous glucose monitor is downloaded today, and the printout is scanned into the record.  It varies from 80-260.  It is in general slightly higher as the day goes on, but not necessarily so.  He says he takes insulins as rx'ed.   Past Medical History:  Diagnosis Date  . CALLUS, RIGHT FOOT 12/08/2009  . Cardiac arrest ([place])   . Chronic systolic heart failure ([place]) 11/03/2009  . DIABETES MELLITUS, TYPE II 12/05/2006  . 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 HYPERLIPIDEMIA 11/25/2006  . HYPOGONADISM, MALE 12/05/2006  . Implantable cardiac defibrillator MDT 09/22/2008   Change out feb 2011, 2015  . Ischemic cardiomyopathy 09/22/2008   DES CX and RCA  70% residual LAD  Done in [place]; EF 30%;; myoviewq 2006/05/28 no ischemia  . Kidney stones   . Long term (current) use of anticoagulants 04/25/2010  . Myocardial infarction [facility]) May 27, 2004   "I died and they brought me back"  . 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 NEPHROLITHIASIS, HX OF 02/25/2008  . Persistent atrial fibrillation ([place]) 11/25/2006   Inapp shock 05/28/10 AF VR >220 recurrent    Past Surgical History:  Procedure Laterality Date  . CARDIAC CATHETERIZATION     feburary 05-27-12  . CARDIAC DEFIBRILLATOR PLACEMENT     Guidant Vitality [name]  . CARDIOVERSION N/A 04/25/2012   Procedure: CARDIOVERSION;  Surgeon: Darlin Coco, MD;  Location[facility] ENDOSCOPY;  Service: Cardiovascular;  Laterality: N/A;  .  CARDIOVERSION N/A 2012/05/27   Procedure: CARDIOVERSION;  Surgeon: Deboraha Sprang, MD;  Location: [place];  Service: Cardiovascular;  Laterality: N/A;  . CATARACT EXTRACTION    . EYE SURGERY     cataracts  . IMPLANTABLE CARDIOVERTER DEFIBRILLATOR (ICD) GENERATOR CHANGE N/A 02/05/2014   Procedure: ICD GENERATOR CHANGE;  Surgeon: Deboraha Sprang, MD;  Location: [facility] CATH LAB;  Service: Cardiovascular;  Laterality: N/A;  . PTCA    . TONSILLECTOMY    . TONSILLECTOMY      Social History   Socioeconomic History  . Marital status: Divorced    Spouse name: Not on file  . Number of children: 1  . Years of education: 19  . Highest education level: Not on file  Occupational History  . 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 Smoking status: Never Smoker  . Smokeless tobacco: Never Used  Substance and Sexual Activity  . Alcohol use: Yes    Alcohol/week: 0.0 standard drinks    Comment: summer   . Drug use: No  . Sexual activity: Yes    Partners: Female  Other Topics Concern  . Not on file  Social History Narrative   HSG, College grad. Married - divorced. 1 daughter. owner/operator Research officer, trade union business and show production   Social Determinants of Radio broadcast assistant Strain:   . Difficulty of Paying Living Expenses:   Food Insecurity:   .  Worried About Charity fundraiser in the Last Year:   . Arboriculturist in the Last Year:   Transportation Needs:   . Film/video editor (Medical):   Marland Kitchen Lack of Transportation (Non-Medical):   Physical Activity:   . Days of Exercise per Week:   . Minutes of Exercise per Session:   Stress:   . Feeling of Stress :   Social Connections:   . Frequency of Communication with Friends and Family:   . Frequency of Social Gatherings with Friends and Family:   . Attends Religious Services:   . Active Member of Clubs or Organizations:   . Attends Archivist  Meetings:   Marland Kitchen Marital Status:   Intimate Partner Violence:   . Fear of Current or Ex-Partner:   . Emotionally Abused:   Marland Kitchen Physically Abused:   . Sexually Abused:     Current Outpatient Medications on File Prior to Visit  Medication Sig Dispense Refill  . carvedilol (COREG) 12.5 MG tablet Take 1 tablet (12.5 mg total) by mouth 2 (two) times daily with a meal. 180 tablet 5  . citalopram (CELEXA) 10 MG tablet TAKE 1 TABLET BY MOUTH EVERY DAY 90 tablet 0  . Continuous Blood Gluc Receiver (FREESTYLE LIBRE 14 DAY READER) DEVI USE AS DIRECTED FOR 1 DOSE 1 each 0  . Continuous Blood Gluc Sensor (FREESTYLE LIBRE 14 DAY SENSOR) MISC APPLY 1 sensor EVERY 14 DAYS 6 each 3  . dofetilide (TIKOSYN) 500 MCG capsule TAKE 1 CAPSULE BY MOUTH 2 TIMES DAILY 60 capsule 0  . fluticasone (FLONASE) 50 MCG/ACT nasal spray USE 2 SPRAYS IN EACH NOSTRIL EVERY DAY 16 g 5  . furosemide (LASIX) 20 MG tablet Take 1 tablet by mouth daily. OVERDUE FOR FOLLOW UP. PLEASE CALL AND SCHEDULE 15 tablet 0  . Glucose Blood (FREESTYLE PRECISION NEO TEST VI) by In Vitro route.    Marland Kitchen glucose blood (ONE TOUCH ULTRA TEST) test strip 1 each by Other route 2 (two) times daily. 200 each 2  . Lancets MISC Use as directed up to 4 times per day 100 each 11  . liraglutide (VICTOZA) 18 MG/3ML SOPN Inject 1.8 mg into the skin.     Marland Kitchen lisinopril (PRINIVIL,ZESTRIL) 5 MG tablet Take 5 mg by mouth daily.    Marland Kitchen loratadine (CLARITIN) 10 MG tablet Take 10 mg by mouth daily.    Marland Kitchen LORazepam (ATIVAN) 1 MG tablet TAKE 1/2 TABLET BY MOUTH EVERY DAY AS NEEDED FOR ANXIETY 45 tablet 1  . Magnesium Oxide (MAG-OX 400 PO) Take 1 tablet by mouth 2 (two) times daily.    . Multiple Vitamins-Minerals (MULTIVITAMIN PO) Take 1 tablet by mouth daily.    Marland Kitchen NITROSTAT 0.4 MG SL tablet Place 1 tablet (0.4 mg total) under the tongue every 5 (five) minutes as needed for chest pain. 25 tablet 5  . pioglitazone (ACTOS) 30 MG tablet TAKE 1 TABLET BY MOUTH EVERY DAY 90 tablet 2  .  ranolazine (RANEXA) 500 MG 12 hr tablet Take 1 tablet (500 mg total) by mouth 2 (two) times daily. 60 tablet 6  . rosuvastatin (CRESTOR) 20 MG tablet TAKE 1 TABLET BY MOUTH EVERY DAY 90 tablet 0  . sacubitril-valsartan (ENTRESTO) 24-26 MG Take 1 tablet by mouth 2 (two) times daily.    . sucralfate (CARAFATE) 1 g tablet Take 1 g by mouth 4 (four) times daily -  with meals and at bedtime.    Marland Kitchen ULTICARE MICRO PEN NEEDLES  32G X 4 MM MISC USE DAILY AS DIRECTED 100 each 3  . XARELTO 20 MG TABS tablet TAKE 1 TABLET BY MOUTH EVERY DAY 30 tablet 5  . zolpidem (AMBIEN) 5 MG tablet TAKE 1 OR 2 TABLETS BY MOUTH NIGHTLY AT BEDTIME AS NEEDED 30 tablet 0   No current facility-administered medications on file prior to visit.    Allergies  Allergen Reactions  . [place] [Fish Allergy] Other (See Comments)    Only canned/ not fresh (probably preservatives).   . Sulfa Antibiotics     Not sure has allergyu to this Only canned/ not fresh (probably preservatives).   . Metformin And Related Other (See Comments)    diarrhea    Family History  Problem Relation Age of Onset  . Diabetes Maternal Grandmother   . Hyperlipidemia Maternal Grandmother   . Breast cancer Mother   . Cancer Neg Hx   . COPD Neg Hx   . Heart disease Neg Hx     BP 100/70   Pulse 84   Ht 5' 8.75" (1.746 m)   Wt 194 lb (88 kg)   SpO2 96%   BMI 28.86 kg/m    Review of Systems He denies hypoglycemia.      Objective:   Physical Exam VITAL SIGNS:  See vs page GENERAL: no distress Pulses: dorsalis pedis intact bilat.   MSK: no deformity of the feet CV: no leg edema Skin:  no ulcer on the feet, but there are heavy calluses.  normal color and temp on the feet. Neuro: sensation is intact to touch on the feet, but decreased from normal  Lab Results  Component Value Date   HGBA1C 6.8 (A) 06/05/2019       Assessment & Plan:  Insulin-requiring type 2 DM, with CAD: The pattern of his cbg's indicates he needs some adjustment in  his therapy.  Hypoglycemia: this limits aggressiveness of glycemic control.  Obesity: adding SGLT med might help, bu he declines.   Patient Instructions  check your blood sugar twice a day.  vary the time of day when you check, between before the 3 meals, and at bedtime.  also check if you have symptoms of your blood sugar being too high or too low.  please keep a record of the readings and bring it to your next appointment here (or you can bring the meter itself).  You can write it on any piece of paper.  please call us sooner if your blood sugar goes below 70, or if you have a lot of readings over 200.   Please change the insulins to the numbers listed below   Please come back for a follow-up appointment in 3 months.

## 2019-06-19 ENCOUNTER — Other Ambulatory Visit: Payer: Self-pay | Admitting: Internal Medicine

## 2019-06-19 NOTE — Telephone Encounter (Signed)
Unable to refill celexa per office med refill policy  Please ask pt to make ROV

## 2019-06-24 DIAGNOSIS — I25118 Atherosclerotic heart disease of native coronary artery with other forms of angina pectoris: Secondary | ICD-10-CM | POA: Diagnosis not present

## 2019-06-24 DIAGNOSIS — E1159 Type 2 diabetes mellitus with other circulatory complications: Secondary | ICD-10-CM | POA: Diagnosis not present

## 2019-06-24 DIAGNOSIS — E785 Hyperlipidemia, unspecified: Secondary | ICD-10-CM | POA: Diagnosis not present

## 2019-06-24 DIAGNOSIS — I5020 Unspecified systolic (congestive) heart failure: Secondary | ICD-10-CM | POA: Diagnosis not present

## 2019-07-17 ENCOUNTER — Other Ambulatory Visit: Payer: Self-pay | Admitting: Internal Medicine

## 2019-07-17 NOTE — Telephone Encounter (Signed)
See below

## 2019-07-17 NOTE — Telephone Encounter (Signed)
Sorry, due to office refill policy, I am unable to refill this medicaiton  Please consider ROV for further refills

## 2019-08-15 ENCOUNTER — Other Ambulatory Visit: Payer: Self-pay | Admitting: Internal Medicine

## 2019-08-16 DIAGNOSIS — L821 Other seborrheic keratosis: Secondary | ICD-10-CM | POA: Diagnosis not present

## 2019-08-16 DIAGNOSIS — L570 Actinic keratosis: Secondary | ICD-10-CM | POA: Diagnosis not present

## 2019-08-16 DIAGNOSIS — D225 Melanocytic nevi of trunk: Secondary | ICD-10-CM | POA: Diagnosis not present

## 2019-08-16 DIAGNOSIS — L820 Inflamed seborrheic keratosis: Secondary | ICD-10-CM | POA: Diagnosis not present

## 2019-08-19 ENCOUNTER — Other Ambulatory Visit: Payer: Self-pay | Admitting: Internal Medicine

## 2019-08-19 NOTE — Telephone Encounter (Signed)
celexa denied per office refill policy

## 2019-08-26 ENCOUNTER — Other Ambulatory Visit: Payer: Self-pay | Admitting: Endocrinology

## 2019-08-29 DIAGNOSIS — Z4502 Encounter for adjustment and management of automatic implantable cardiac defibrillator: Secondary | ICD-10-CM | POA: Diagnosis not present

## 2019-09-04 ENCOUNTER — Other Ambulatory Visit: Payer: Self-pay

## 2019-09-04 ENCOUNTER — Encounter: Payer: Self-pay | Admitting: Endocrinology

## 2019-09-04 ENCOUNTER — Ambulatory Visit: Payer: Medicare Other | Admitting: Endocrinology

## 2019-09-04 VITALS — BP 106/70 | HR 81 | Ht 68.75 in | Wt 190.4 lb

## 2019-09-04 DIAGNOSIS — E1142 Type 2 diabetes mellitus with diabetic polyneuropathy: Secondary | ICD-10-CM | POA: Diagnosis not present

## 2019-09-04 DIAGNOSIS — Z794 Long term (current) use of insulin: Secondary | ICD-10-CM

## 2019-09-04 DIAGNOSIS — E0800 Diabetes mellitus due to underlying condition with hyperosmolarity without nonketotic hyperglycemic-hyperosmolar coma (NKHHC): Secondary | ICD-10-CM

## 2019-09-04 LAB — POCT GLYCOSYLATED HEMOGLOBIN (HGB A1C): Hemoglobin A1C: 6.9 % — AB (ref 4.0–5.6)

## 2019-09-04 NOTE — Patient Instructions (Addendum)
check your blood sugar twice a day.  vary the time of day when you check, between before the 3 meals, and at bedtime.  also check if you have symptoms of your blood sugar being too high or too low.  please keep a record of the readings and bring it to your next appointment here (or you can bring the meter itself).  You can write it on any piece of paper.  please call us sooner if your blood sugar goes below 70, or if you have a lot of readings over 200.   Please continue the same insulins for now.  When we can see more blood sugar trends later in the day, we may need to adjust the insulin.   Please come back for a follow-up appointment in 3 months.

## 2019-09-04 NOTE — Progress Notes (Signed)
Subjective:    Patient ID: [name], male    DOB: 10-01-1945, 74 y.o.   MRN: [id number]  HPI Pt returns for f/u of diabetes mellitus: DM type: Insulin-requiring type 2.   Dx'ed: 7616 Complications: polyneuropathy, renal insuff, and CAD.   Therapy: insulin since mid-2018, and Victoza. DKA: never Severe hypoglycemia: never.   Pancreatitis: never.  Pancreatic imaging: never Other: He did not tolerate metformin (diarrhea); he declines V-GO, due to cost.  He takes multiple daily injections.   Interval history: continuous glucose monitor is downloaded today, and the printout is scanned into the record.  It varies from 80-260.  It is in general slightly higher from breakfast to lunch.  He seldom has hypoglycemia, and these episodes are mild.  He says he takes insulins as rx'ed.   Past Medical History:  Diagnosis Date  . CALLUS, RIGHT FOOT 12/08/2009  . Cardiac arrest ([place])   . Chronic systolic heart failure ([place]) 11/03/2009  . DIABETES MELLITUS, TYPE II 12/05/2006  . 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 HYPERLIPIDEMIA 11/25/2006  . HYPOGONADISM, MALE 12/05/2006  . Implantable cardiac defibrillator MDT 09/22/2008   Change out feb 2011, 2015  . Ischemic cardiomyopathy 09/22/2008   DES CX and RCA  70% residual LAD  Done in [place]; EF 30%;; myoviewq June 12, 2006 no ischemia  . Kidney stones   . Long term (current) use of anticoagulants 04/25/2010  . Myocardial infarction [facility]) 2004-06-11   "I died and they brought me back"  . 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 NEPHROLITHIASIS, HX OF 02/25/2008  . Persistent atrial fibrillation ([place]) 11/25/2006   Inapp shock 06/12/10 AF VR >220 recurrent    Past Surgical History:  Procedure Laterality Date  . CARDIAC CATHETERIZATION     feburary 06-11-2012  . CARDIAC DEFIBRILLATOR PLACEMENT     Guidant Vitality [name]  . CARDIOVERSION N/A 04/25/2012   Procedure: CARDIOVERSION;  Surgeon: Darlin Coco, MD;  Location: [facility] ENDOSCOPY;  Service:  Cardiovascular;  Laterality: N/A;  . CARDIOVERSION N/A 06-11-2012   Procedure: CARDIOVERSION;  Surgeon: Deboraha Sprang, MD;  Location: [place];  Service: Cardiovascular;  Laterality: N/A;  . CATARACT EXTRACTION    . EYE SURGERY     cataracts  . IMPLANTABLE CARDIOVERTER DEFIBRILLATOR (ICD) GENERATOR CHANGE N/A 02/05/2014   Procedure: ICD GENERATOR CHANGE;  Surgeon: Deboraha Sprang, MD;  Location: [facility] CATH LAB;  Service: Cardiovascular;  Laterality: N/A;  . PTCA    . TONSILLECTOMY    . TONSILLECTOMY      Social History   Socioeconomic History  . Marital status: Divorced    Spouse name: Not on file  . Number of children: 1  . Years of education: 28  . Highest education level: Not on file  Occupational History  . 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 Smoking status: Never Smoker  . Smokeless tobacco: Never Used  Substance and Sexual Activity  . Alcohol use: Yes    Alcohol/week: 0.0 standard drinks    Comment: summer   . Drug use: No  . Sexual activity: Yes    Partners: Female  Other Topics Concern  . Not on file  Social History Narrative   HSG, College grad. Married - divorced. 1 daughter. owner/operator Research officer, trade union business and show production   Social Determinants of Radio broadcast assistant Strain:   . Difficulty of Paying Living Expenses:   Food  Insecurity:   . Worried About Charity fundraiser in the Last Year:   . Arboriculturist in the Last Year:   Transportation Needs:   . Film/video editor (Medical):   Marland Kitchen Lack of Transportation (Non-Medical):   Physical Activity:   . Days of Exercise per Week:   . Minutes of Exercise per Session:   Stress:   . Feeling of Stress :   Social Connections:   . Frequency of Communication with Friends and Family:   . Frequency of Social Gatherings with Friends and Family:   . Attends Religious Services:   . Active Member of Clubs or  Organizations:   . Attends Archivist Meetings:   Marland Kitchen Marital Status:   Intimate Partner Violence:   . Fear of Current or Ex-Partner:   . Emotionally Abused:   Marland Kitchen Physically Abused:   . Sexually Abused:     Current Outpatient Medications on File Prior to Visit  Medication Sig Dispense Refill  . carvedilol (COREG) 12.5 MG tablet Take 1 tablet (12.5 mg total) by mouth 2 (two) times daily with a meal. 180 tablet 5  . citalopram (CELEXA) 10 MG tablet TAKE 1 TABLET BY MOUTH EVERY DAY 90 tablet 0  . Continuous Blood Gluc Receiver (FREESTYLE LIBRE 14 DAY READER) DEVI USE AS DIRECTED FOR 1 DOSE 1 each 0  . Continuous Blood Gluc Sensor (FREESTYLE LIBRE 14 DAY SENSOR) MISC APPLY 1 sensor EVERY 14 DAYS 6 each 3  . dofetilide (TIKOSYN) 500 MCG capsule TAKE 1 CAPSULE BY MOUTH 2 TIMES DAILY 60 capsule 0  . fluticasone (FLONASE) 50 MCG/ACT nasal spray USE 2 SPRAYS IN EACH NOSTRIL EVERY DAY 16 g 5  . furosemide (LASIX) 20 MG tablet Take 1 tablet by mouth daily. OVERDUE FOR FOLLOW UP. PLEASE CALL AND SCHEDULE 15 tablet 0  . Glucose Blood (FREESTYLE PRECISION NEO TEST VI) by In Vitro route.    Marland Kitchen glucose blood (ONE TOUCH ULTRA TEST) test strip 1 each by Other route 2 (two) times daily. 200 each 2  . insulin lispro (ADMELOG) 100 UNIT/ML injection Inject 8 Units into the skin 3 (three) times daily before meals.    . Lancets MISC Use as directed up to 4 times per day 100 each 11  . LANTUS SOLOSTAR 100 UNIT/ML Solostar Pen Inject 15 Units into the skin daily. 15 mL 11  . liraglutide (VICTOZA) 18 MG/3ML SOPN Inject 1.8 mg into the skin.     Marland Kitchen lisinopril (PRINIVIL,ZESTRIL) 5 MG tablet Take 5 mg by mouth daily.    Marland Kitchen loratadine (CLARITIN) 10 MG tablet Take 10 mg by mouth daily.    Marland Kitchen LORazepam (ATIVAN) 1 MG tablet TAKE 1/2 TABLET BY MOUTH EVERY DAY AS NEEDED FOR ANXIETY 45 tablet 1  . Magnesium Oxide (MAG-OX 400 PO) Take 1 tablet by mouth 2 (two) times daily.    . Multiple Vitamins-Minerals (MULTIVITAMIN PO)  Take 1 tablet by mouth daily.    Marland Kitchen NITROSTAT 0.4 MG SL tablet Place 1 tablet (0.4 mg total) under the tongue every 5 (five) minutes as needed for chest pain. 25 tablet 5  . ranolazine (RANEXA) 500 MG 12 hr tablet Take 1 tablet (500 mg total) by mouth 2 (two) times daily. 60 tablet 6  . rosuvastatin (CRESTOR) 20 MG tablet TAKE 1 TABLET BY MOUTH EVERY DAY 90 tablet 0  . sacubitril-valsartan (ENTRESTO) 24-26 MG Take 1 tablet by mouth 2 (two) times daily.    Marland Kitchen ULTICARE MICRO  PEN NEEDLES 32G X 4 MM MISC USE DAILY AS DIRECTED 100 each 3  . XARELTO 20 MG TABS tablet TAKE 1 TABLET BY MOUTH EVERY DAY 30 tablet 5  . zolpidem (AMBIEN) 5 MG tablet TAKE 1 OR 2 TABLETS BY MOUTH NIGHTLY AT BEDTIME AS NEEDED 30 tablet 0   No current facility-administered medications on file prior to visit.    Allergies  Allergen Reactions  . Salmon [Fish Allergy] Other (See Comments)    Only canned/ not fresh (probably preservatives).   . Sulfa Antibiotics     Not sure has allergyu to this Only canned/ not fresh (probably preservatives).   . Metformin And Related Other (See Comments)    diarrhea    Family History  Problem Relation Age of Onset  . Diabetes Maternal Grandmother   . Hyperlipidemia Maternal Grandmother   . Breast cancer Mother   . Cancer Neg Hx   . COPD Neg Hx   . Heart disease Neg Hx     BP 106/70   Pulse 81   Ht 5' 8.75" (1.746 m)   Wt 190 lb 6.4 oz (86.4 kg)   SpO2 94%   BMI 28.32 kg/m    Review of Systems He denies LOC.     Objective:   Physical Exam VITAL SIGNS:  See vs page GENERAL: no distress Pulses: dorsalis pedis intact bilat.   MSK: no deformity of the feet CV: no leg edema Skin:  no ulcer on the feet, but there are heavy calluses on the feet.  normal color and temp on the feet. Neuro: sensation is intact to touch on the feet, but decreased from normal  Lab Results  Component Value Date   HGBA1C 6.9 (A) 09/04/2019       Assessment & Plan:  Insulin-requiring type 2  DM, with PN: well-controlled.   Patient Instructions  check your blood sugar twice a day.  vary the time of day when you check, between before the 3 meals, and at bedtime.  also check if you have symptoms of your blood sugar being too high or too low.  please keep a record of the readings and bring it to your next appointment here (or you can bring the meter itself).  You can write it on any piece of paper.  please call us sooner if your blood sugar goes below 70, or if you have a lot of readings over 200.   Please continue the same insulins for now.  When we can see more blood sugar trends later in the day, we may need to adjust the insulin.   Please come back for a follow-up appointment in 3 months.

## 2019-09-11 ENCOUNTER — Encounter: Payer: Self-pay | Admitting: Internal Medicine

## 2019-09-11 ENCOUNTER — Ambulatory Visit (INDEPENDENT_AMBULATORY_CARE_PROVIDER_SITE_OTHER): Payer: Medicare Other | Admitting: Internal Medicine

## 2019-09-11 ENCOUNTER — Other Ambulatory Visit: Payer: Self-pay

## 2019-09-11 VITALS — BP 114/72 | HR 56 | Temp 97.8°F | Ht 68.75 in | Wt 190.0 lb

## 2019-09-11 DIAGNOSIS — E559 Vitamin D deficiency, unspecified: Secondary | ICD-10-CM | POA: Diagnosis not present

## 2019-09-11 DIAGNOSIS — E0842 Diabetes mellitus due to underlying condition with diabetic polyneuropathy: Secondary | ICD-10-CM

## 2019-09-11 DIAGNOSIS — Z0000 Encounter for general adult medical examination without abnormal findings: Secondary | ICD-10-CM | POA: Diagnosis not present

## 2019-09-11 DIAGNOSIS — E538 Deficiency of other specified B group vitamins: Secondary | ICD-10-CM | POA: Diagnosis not present

## 2019-09-11 NOTE — Progress Notes (Signed)
Subjective:    Patient ID: [name], male    DOB: 01/12/1946, 74 y.o.   MRN: [id number]  HPI  Here for wellness and f/u;  Overall doing ok;  Pt denies Chest pain, worsening SOB, DOE, wheezing, orthopnea, PND, worsening LE edema, palpitations, dizziness or syncope.  Pt denies neurological change such as new headache, facial or extremity weakness.  Pt denies polydipsia, polyuria, or low sugar symptoms. Pt states overall good compliance with treatment and medications, good tolerability, and has been trying to follow appropriate diet.  Pt denies worsening depressive symptoms, suicidal ideation or panic. No fever, night sweats, wt loss, loss of appetite, or other constitutional symptoms.  Pt states good ability with ADL's, has low fall risk, home safety reviewed and adequate, no other significant changes in hearing or vision, and only occasionally active with exercise. Plans to see optho soon. No new complaints Past Medical History:  Diagnosis Date   CALLUS, RIGHT FOOT 12/08/2009   Cardiac arrest [facility] [place])    Chronic systolic heart failure ([place]) 11/03/2009   DIABETES MELLITUS, TYPE II 12/05/2006   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/2009   HYPERLIPIDEMIA 11/25/2006   HYPOGONADISM, MALE 12/05/2006   Implantable cardiac defibrillator MDT 09/22/2008   Change out feb 2011, 2015   Ischemic cardiomyopathy 09/22/2008   DES CX and RCA  70% residual LAD  Done in [place]; EF 30%;; myoviewq 06/19/2006 no ischemia   Kidney stones    Long term (current) use of anticoagulants 04/25/2010   Myocardial infarction [facility]) 2004-06-18   "I died and they brought me back"   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   NEPHROLITHIASIS, HX OF 02/25/2008   Persistent atrial fibrillation ([place]) 11/25/2006   Inapp shock 19-Jun-2010 AF VR >220 recurrent   Past Surgical History:  Procedure Laterality Date   CARDIAC CATHETERIZATION     feburary 06/18/12   CARDIAC DEFIBRILLATOR PLACEMENT     Guidant Vitality [name]   CARDIOVERSION  N/A 04/25/2012   Procedure: CARDIOVERSION;  Surgeon: Darlin Coco, MD;  Location: [facility] ENDOSCOPY;  Service: Cardiovascular;  Laterality: N/A;   CARDIOVERSION N/A 06/18/12   Procedure: CARDIOVERSION;  Surgeon: Deboraha Sprang, MD;  Location: [place];  Service: Cardiovascular;  Laterality: N/A;   CATARACT EXTRACTION     EYE SURGERY     cataracts   IMPLANTABLE CARDIOVERTER DEFIBRILLATOR (ICD) GENERATOR CHANGE N/A 02/05/2014   Procedure: ICD GENERATOR CHANGE;  Surgeon: Deboraha Sprang, MD;  Location: [facility] CATH LAB;  Service: Cardiovascular;  Laterality: N/A;   PTCA     TONSILLECTOMY     TONSILLECTOMY      reports that he has never smoked. He has never used smokeless tobacco. He reports current alcohol use. He reports that he does not use drugs. family history includes Breast cancer in his mother; Diabetes in his maternal grandmother; Hyperlipidemia in his maternal grandmother. Allergies  Allergen Reactions   Salmon [Fish Allergy] Other (See Comments)    Only canned/ not fresh (probably preservatives).    Sulfa Antibiotics     Not sure has allergyu to this Only canned/ not fresh (probably preservatives).    Metformin And Related Other (See Comments)    diarrhea   Current Outpatient Medications on File Prior to Visit  Medication Sig Dispense Refill   carvedilol (COREG) 12.5 MG tablet Take 1 tablet (12.5 mg total) by mouth 2 (two) times daily with a meal. 180 tablet 5   citalopram (CELEXA) 10 MG tablet TAKE 1 TABLET BY MOUTH EVERY DAY  90 tablet 0   Continuous Blood Gluc Receiver (FREESTYLE LIBRE 14 DAY READER) DEVI USE AS DIRECTED FOR 1 DOSE 1 each 0   Continuous Blood Gluc Sensor (FREESTYLE LIBRE 14 DAY SENSOR) MISC APPLY 1 sensor EVERY 14 DAYS 6 each 3   dofetilide (TIKOSYN) 500 MCG capsule TAKE 1 CAPSULE BY MOUTH 2 TIMES DAILY 60 capsule 0   fluticasone (FLONASE) 50 MCG/ACT nasal spray USE 2 SPRAYS IN EACH NOSTRIL EVERY DAY 16 g 5   furosemide (LASIX) 20 MG tablet Take  1 tablet by mouth daily. OVERDUE FOR FOLLOW UP. PLEASE CALL AND SCHEDULE 15 tablet 0   Glucose Blood (FREESTYLE PRECISION NEO TEST VI) by In Vitro route.     glucose blood (ONE TOUCH ULTRA TEST) test strip 1 each by Other route 2 (two) times daily. 200 each 2   insulin lispro (ADMELOG) 100 UNIT/ML injection Inject 8 Units into the skin 3 (three) times daily before meals.     Lancets MISC Use as directed up to 4 times per day 100 each 11   LANTUS SOLOSTAR 100 UNIT/ML Solostar Pen Inject 15 Units into the skin daily. 15 mL 11   liraglutide (VICTOZA) 18 MG/3ML SOPN Inject 1.8 mg into the skin.      lisinopril (PRINIVIL,ZESTRIL) 5 MG tablet Take 5 mg by mouth daily.     loratadine (CLARITIN) 10 MG tablet Take 10 mg by mouth daily.     LORazepam (ATIVAN) 1 MG tablet TAKE 1/2 TABLET BY MOUTH EVERY DAY AS NEEDED FOR ANXIETY 45 tablet 1   Magnesium Oxide (MAG-OX 400 PO) Take 1 tablet by mouth 2 (two) times daily.     Multiple Vitamins-Minerals (MULTIVITAMIN PO) Take 1 tablet by mouth daily.     NITROSTAT 0.4 MG SL tablet Place 1 tablet (0.4 mg total) under the tongue every 5 (five) minutes as needed for chest pain. 25 tablet 5   ranolazine (RANEXA) 500 MG 12 hr tablet Take 1 tablet (500 mg total) by mouth 2 (two) times daily. 60 tablet 6   rosuvastatin (CRESTOR) 20 MG tablet TAKE 1 TABLET BY MOUTH EVERY DAY 90 tablet 0   sacubitril-valsartan (ENTRESTO) 24-26 MG Take 1 tablet by mouth 2 (two) times daily.     ULTICARE MICRO PEN NEEDLES 32G X 4 MM MISC USE DAILY AS DIRECTED 100 each 3   XARELTO 20 MG TABS tablet TAKE 1 TABLET BY MOUTH EVERY DAY 30 tablet 5   zolpidem (AMBIEN) 5 MG tablet TAKE 1 OR 2 TABLETS BY MOUTH NIGHTLY AT BEDTIME AS NEEDED 30 tablet 0   No current facility-administered medications on file prior to visit.   Review of Systems All otherwise neg per pt    Objective:   Physical Exam BP 114/72    Pulse (!) 56    Temp 97.8 F (36.6 C) (Oral)    Ht 5' 8.75" (1.746 m)     Wt 190 lb (86.2 kg)    SpO2 98%    BMI 28.26 kg/m  VS noted,  Constitutional: Pt appears in NAD HENT: Head: NCAT.  Right Ear: External ear normal.  Left Ear: External ear normal.  Eyes: . Pupils are equal, round, and reactive to light. Conjunctivae and EOM are normal Nose: without d/c or deformity Neck: Neck supple. Gross normal ROM Cardiovascular: Normal rate and regular rhythm.   Pulmonary/Chest: Effort normal and breath sounds without rales or wheezing.  Abd:  Soft, NT, ND, + BS, no organomegaly Neurological: Pt is alert. At  baseline orientation, motor grossly intact Skin: Skin is warm. No rashes, other new lesions, no LE edema Psychiatric: Pt behavior is normal without agitation  All otherwise neg per pt Lab Results  Component Value Date   WBC 7.8 09/11/2019   HGB 15.0 09/11/2019   HCT 44.4 09/11/2019   PLT 161 09/11/2019   GLUCOSE 144 (H) 09/11/2019   CHOL 119 09/11/2019   TRIG 374 (H) 09/11/2019   HDL 31 (L) 09/11/2019   LDLDIRECT 134.0 10/17/2017   LDLCALC 49 09/11/2019   ALT 15 09/11/2019   AST 18 09/11/2019   NA 136 09/11/2019   K 4.6 09/11/2019   CL 101 09/11/2019   CREATININE 1.28 (H) 09/11/2019   BUN 22 09/11/2019   CO2 27 09/11/2019   TSH 2.98 09/11/2019   PSA 0.4 09/11/2019   INR 1.64 03/18/2017   HGBA1C 6.9 (A) 09/04/2019   MICROALBUR 0.8 10/17/2017      Assessment & Plan:

## 2019-09-11 NOTE — Patient Instructions (Signed)
Please continue all other medications as before, and refills have been done if requested.  Please have the pharmacy call with any other refills you may need.  Please continue your efforts at being more active, low cholesterol diet, and weight control.  You are otherwise up to date with prevention measures today.  Please keep your appointments with your specialists as you may have planned  Please go to the LAB at the blood drawing area for the tests to be done  You will be contacted by phone if any changes need to be made immediately.  Otherwise, you will receive a letter about your results with an explanation, but please check with MyChart first.  Please remember to sign up for MyChart if you have not done so, as this will be important to you in the future with finding out test results, communicating by private email, and scheduling acute appointments online when needed.  Please make an Appointment to return for your 1 year visit, or sooner if needed, with Lab testing by Appointment as well, to be done about 3-5 days before at the FIRST FLOOR Lab (so this is for TWO appointments - please see the scheduling desk as you leave) 

## 2019-09-12 ENCOUNTER — Other Ambulatory Visit: Payer: Self-pay | Admitting: Internal Medicine

## 2019-09-12 ENCOUNTER — Encounter: Payer: Self-pay | Admitting: Internal Medicine

## 2019-09-12 LAB — TSH: TSH: 2.98 mIU/L (ref 0.40–4.50)

## 2019-09-12 LAB — BASIC METABOLIC PANEL
BUN/Creatinine Ratio: 17 (calc) (ref 6–22)
BUN: 22 mg/dL (ref 7–25)
CO2: 27 mmol/L (ref 20–32)
Calcium: 9.4 mg/dL (ref 8.6–10.3)
Chloride: 101 mmol/L (ref 98–110)
Creat: 1.28 mg/dL — ABNORMAL HIGH (ref 0.70–1.18)
Glucose, Bld: 144 mg/dL — ABNORMAL HIGH (ref 65–99)
Potassium: 4.6 mmol/L (ref 3.5–5.3)
Sodium: 136 mmol/L (ref 135–146)

## 2019-09-12 LAB — CBC WITH DIFFERENTIAL/PLATELET
Absolute Monocytes: 1193 cells/uL — ABNORMAL HIGH (ref 200–950)
Basophils Absolute: 47 cells/uL (ref 0–200)
Basophils Relative: 0.6 %
Eosinophils Absolute: 242 cells/uL (ref 15–500)
Eosinophils Relative: 3.1 %
HCT: 44.4 % (ref 38.5–50.0)
Hemoglobin: 15 g/dL (ref 13.2–17.1)
Lymphs Abs: 1560 cells/uL (ref 850–3900)
MCH: 30.2 pg (ref 27.0–33.0)
MCHC: 33.8 g/dL (ref 32.0–36.0)
MCV: 89.5 fL (ref 80.0–100.0)
MPV: 13 fL — ABNORMAL HIGH (ref 7.5–12.5)
Monocytes Relative: 15.3 %
Neutro Abs: 4758 cells/uL (ref 1500–7800)
Neutrophils Relative %: 61 %
Platelets: 161 10*3/uL (ref 140–400)
RBC: 4.96 10*6/uL (ref 4.20–5.80)
RDW: 13.2 % (ref 11.0–15.0)
Total Lymphocyte: 20 %
WBC: 7.8 10*3/uL (ref 3.8–10.8)

## 2019-09-12 LAB — LIPID PANEL
Cholesterol: 119 mg/dL (ref <200)
HDL: 31 mg/dL — ABNORMAL LOW (ref >=40)
LDL Cholesterol (Calc): 49 mg/dL (calc)
Non-HDL Cholesterol (Calc): 88 mg/dL (calc) (ref <130)
Total CHOL/HDL Ratio: 3.8 (calc) (ref <5.0)
Triglycerides: 374 mg/dL — ABNORMAL HIGH (ref <150)

## 2019-09-12 LAB — HEPATIC FUNCTION PANEL
AG Ratio: 1.5 (calc) (ref 1.0–2.5)
ALT: 15 U/L (ref 9–46)
AST: 18 U/L (ref 10–35)
Albumin: 4.3 g/dL (ref 3.6–5.1)
Alkaline phosphatase (APISO): 69 U/L (ref 35–144)
Bilirubin, Direct: 0.1 mg/dL (ref 0.0–0.2)
Globulin: 2.9 g/dL (calc) (ref 1.9–3.7)
Indirect Bilirubin: 0.4 mg/dL (calc) (ref 0.2–1.2)
Total Bilirubin: 0.5 mg/dL (ref 0.2–1.2)
Total Protein: 7.2 g/dL (ref 6.1–8.1)

## 2019-09-12 LAB — VITAMIN D 25 HYDROXY (VIT D DEFICIENCY, FRACTURES): Vit D, 25-Hydroxy: 23 ng/mL — ABNORMAL LOW (ref 30–100)

## 2019-09-12 LAB — VITAMIN B12: Vitamin B-12: 877 pg/mL (ref 200–1100)

## 2019-09-12 LAB — PSA: PSA: 0.4 ng/mL (ref <=4.0)

## 2019-09-12 MED ORDER — VITAMIN D (ERGOCALCIFEROL) 1.25 MG (50000 UNIT) PO CAPS: 50000.0000 [IU] | ORAL_CAPSULE | ORAL | 0 refills | Status: DC

## 2019-09-14 ENCOUNTER — Encounter: Payer: Self-pay | Admitting: Internal Medicine

## 2019-09-14 NOTE — Assessment & Plan Note (Signed)
F/u endo  

## 2019-09-14 NOTE — Assessment & Plan Note (Signed)

## 2019-09-16 ENCOUNTER — Other Ambulatory Visit: Payer: Self-pay | Admitting: Internal Medicine

## 2019-09-16 NOTE — Telephone Encounter (Signed)
Done erx 

## 2019-09-26 DIAGNOSIS — Z6827 Body mass index (BMI) 27.0-27.9, adult: Secondary | ICD-10-CM | POA: Diagnosis not present

## 2019-09-26 DIAGNOSIS — I5020 Unspecified systolic (congestive) heart failure: Secondary | ICD-10-CM | POA: Diagnosis not present

## 2019-10-31 DIAGNOSIS — I517 Cardiomegaly: Secondary | ICD-10-CM | POA: Diagnosis not present

## 2019-10-31 DIAGNOSIS — I5020 Unspecified systolic (congestive) heart failure: Secondary | ICD-10-CM | POA: Diagnosis not present

## 2019-10-31 DIAGNOSIS — Z6827 Body mass index (BMI) 27.0-27.9, adult: Secondary | ICD-10-CM | POA: Diagnosis not present

## 2019-11-06 ENCOUNTER — Other Ambulatory Visit: Payer: Self-pay | Admitting: Internal Medicine

## 2019-11-12 DIAGNOSIS — Z20822 Contact with and (suspected) exposure to covid-19: Secondary | ICD-10-CM | POA: Diagnosis not present

## 2019-11-28 DIAGNOSIS — Z4502 Encounter for adjustment and management of automatic implantable cardiac defibrillator: Secondary | ICD-10-CM | POA: Diagnosis not present

## 2019-12-02 DIAGNOSIS — Z23 Encounter for immunization: Secondary | ICD-10-CM | POA: Diagnosis not present

## 2019-12-02 DIAGNOSIS — I10 Essential (primary) hypertension: Secondary | ICD-10-CM | POA: Diagnosis not present

## 2019-12-02 DIAGNOSIS — I5020 Unspecified systolic (congestive) heart failure: Secondary | ICD-10-CM | POA: Diagnosis not present

## 2019-12-02 DIAGNOSIS — Z6827 Body mass index (BMI) 27.0-27.9, adult: Secondary | ICD-10-CM | POA: Diagnosis not present

## 2019-12-07 DIAGNOSIS — Z1211 Encounter for screening for malignant neoplasm of colon: Secondary | ICD-10-CM | POA: Diagnosis not present

## 2019-12-07 DIAGNOSIS — Z1212 Encounter for screening for malignant neoplasm of rectum: Secondary | ICD-10-CM | POA: Diagnosis not present

## 2019-12-10 ENCOUNTER — Ambulatory Visit (INDEPENDENT_AMBULATORY_CARE_PROVIDER_SITE_OTHER): Payer: Medicare Other | Admitting: Endocrinology

## 2019-12-10 ENCOUNTER — Other Ambulatory Visit: Payer: Self-pay

## 2019-12-10 ENCOUNTER — Encounter: Payer: Self-pay | Admitting: Endocrinology

## 2019-12-10 VITALS — BP 118/74 | HR 94 | Ht 68.75 in | Wt 198.6 lb

## 2019-12-10 DIAGNOSIS — E1122 Type 2 diabetes mellitus with diabetic chronic kidney disease: Secondary | ICD-10-CM

## 2019-12-10 DIAGNOSIS — N189 Chronic kidney disease, unspecified: Secondary | ICD-10-CM

## 2019-12-10 DIAGNOSIS — E0842 Diabetes mellitus due to underlying condition with diabetic polyneuropathy: Secondary | ICD-10-CM

## 2019-12-10 LAB — POCT GLYCOSYLATED HEMOGLOBIN (HGB A1C): Hemoglobin A1C: 7 % — AB (ref 4.0–5.6)

## 2019-12-10 MED ORDER — INSULIN LISPRO 100 UNIT/ML ~~LOC~~ SOLN: 10.0000 [IU] | Freq: Three times a day (TID) | SUBCUTANEOUS | 11 refills | Status: DC

## 2019-12-10 MED ORDER — LANTUS SOLOSTAR 100 UNIT/ML ~~LOC~~ SOPN: 14.0000 [IU] | PEN_INJECTOR | Freq: Every day | SUBCUTANEOUS | 11 refills | Status: DC

## 2019-12-10 NOTE — Patient Instructions (Addendum)
check your blood sugar twice a day.  vary the time of day when you check, between before the 3 meals, and at bedtime.  also check if you have symptoms of your blood sugar being too high or too low.  please keep a record of the readings and bring it to your next appointment here (or you can bring the meter itself).  You can write it on any piece of paper.  please call us sooner if your blood sugar goes below 70, or if you have a lot of readings over 200.   Please change the insulins to the numbers listed below.   Please come back for a follow-up appointment in 3 months.

## 2019-12-10 NOTE — Progress Notes (Signed)
Subjective:    Patient ID: [name], male    DOB: [birth year], 74 y.o.   MRN: [id number]  HPI Pt returns for f/u of diabetes mellitus: DM type: Insulin-requiring type 2.   Dx'ed: 4782 Complications: polyneuropathy, renal insuff, and CAD.   Therapy: insulin since mid-2018, and Victoza.  DKA: never Severe hypoglycemia: never.   Pancreatitis: never.  Pancreatic imaging: never Other: He did not tolerate metformin (diarrhea); he declines V-GO, due to cost.  He takes multiple daily injections.   Interval history: continuous glucose monitor is downloaded today, and the printout is scanned into the record.  It varies from 90-240.  It is in general higher as the day goes on, but there is little trend throughout the day.  He seldom has hypoglycemia, and these episodes are mild.  He says he takes insulins as rx'ed.   Past Medical History:  Diagnosis Date  . CALLUS, RIGHT FOOT 12/08/2009  . Cardiac arrest ([place])   . Chronic systolic heart failure ([place]) 11/03/2009  . DIABETES MELLITUS, TYPE II 12/05/2006  . 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 HYPERLIPIDEMIA 11/25/2006  . HYPOGONADISM, MALE 12/05/2006  . Implantable cardiac defibrillator MDT 09/22/2008   Change out feb 2011, 2015  . Ischemic cardiomyopathy 09/22/2008   DES CX and RCA  70% residual LAD  Done in [place]; EF 30%;; myoviewq 25-May-2006 no ischemia  . Kidney stones   . Long term (current) use of anticoagulants 04/25/2010  . Myocardial infarction [facility]) 2004/05/24   "I died and they brought me back"  . 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 NEPHROLITHIASIS, HX OF 02/25/2008  . Persistent atrial fibrillation ([place]) 11/25/2006   Inapp shock 2010-05-25 AF VR >220 recurrent    Past Surgical History:  Procedure Laterality Date  . CARDIAC CATHETERIZATION     feburary 05-24-2012  . CARDIAC DEFIBRILLATOR PLACEMENT     Guidant Vitality [name]  . CARDIOVERSION N/A 04/25/2012   Procedure: CARDIOVERSION;  Surgeon: Darlin Coco, MD;  Location: [facility]  ENDOSCOPY;  Service: Cardiovascular;  Laterality: N/A;  . CARDIOVERSION N/A 05/21/2012   Procedure: CARDIOVERSION;  Surgeon: Deboraha Sprang, MD;  Location: [place];  Service: Cardiovascular;  Laterality: N/A;  . CATARACT EXTRACTION    . EYE SURGERY     cataracts  . IMPLANTABLE CARDIOVERTER DEFIBRILLATOR (ICD) GENERATOR CHANGE N/A 02/05/2014   Procedure: ICD GENERATOR CHANGE;  Surgeon: Deboraha Sprang, MD;  Location: [facility] CATH LAB;  Service: Cardiovascular;  Laterality: N/A;  . PTCA    . TONSILLECTOMY    . TONSILLECTOMY      Social History   Socioeconomic History  . Marital status: Divorced    Spouse name: Not on file  . Number of children: 1  . Years of education: 33  . Highest education level: Not on file  Occupational History  . 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 Smoking status: Never Smoker  . Smokeless tobacco: Never Used  Substance and Sexual Activity  . Alcohol use: Yes    Alcohol/week: 0.0 standard drinks    Comment: summer   . Drug use: No  . Sexual activity: Yes    Partners: Female  Other Topics Concern  . Not on file  Social History Narrative   HSG, College grad. Married - divorced. 1 daughter. owner/operator Research officer, trade union business and show production   Social Determinants of Radio broadcast assistant Strain:   .  Difficulty of Paying Living Expenses: Not on file  Food Insecurity:   . Worried About Charity fundraiser in the Last Year: Not on file  . Ran Out of Food in the Last Year: Not on file  Transportation Needs:   . Lack of Transportation (Medical): Not on file  . Lack of Transportation (Non-Medical): Not on file  Physical Activity:   . Days of Exercise per Week: Not on file  . Minutes of Exercise per Session: Not on file  Stress:   . Feeling of Stress : Not on file  Social Connections:   . Frequency of Communication with Friends and Family: Not on file  . Frequency  of Social Gatherings with Friends and Family: Not on file  . Attends Religious Services: Not on file  . Active Member of Clubs or Organizations: Not on file  . Attends Archivist Meetings: Not on file  . Marital Status: Not on file  Intimate Partner Violence:   . Fear of Current or Ex-Partner: Not on file  . Emotionally Abused: Not on file  . Physically Abused: Not on file  . Sexually Abused: Not on file    Current Outpatient Medications on File Prior to Visit  Medication Sig Dispense Refill  . carvedilol (COREG) 12.5 MG tablet Take 1 tablet (12.5 mg total) by mouth 2 (two) times daily with a meal. 180 tablet 5  . citalopram (CELEXA) 10 MG tablet TAKE 1 TABLET BY MOUTH EVERY DAY 90 tablet 3  . Continuous Blood Gluc Receiver (FREESTYLE LIBRE 14 DAY READER) DEVI USE AS DIRECTED FOR 1 DOSE 1 each 0  . Continuous Blood Gluc Sensor (FREESTYLE LIBRE 14 DAY SENSOR) MISC APPLY 1 sensor EVERY 14 DAYS 6 each 3  . dofetilide (TIKOSYN) 500 MCG capsule TAKE 1 CAPSULE BY MOUTH 2 TIMES DAILY 60 capsule 0  . fluticasone (FLONASE) 50 MCG/ACT nasal spray USE 2 SPRAYS IN EACH NOSTRIL EVERY DAY 16 g 5  . furosemide (LASIX) 20 MG tablet Take 1 tablet by mouth daily. OVERDUE FOR FOLLOW UP. PLEASE CALL AND SCHEDULE 15 tablet 0  . Glucose Blood (FREESTYLE PRECISION NEO TEST VI) by In Vitro route.    Marland Kitchen glucose blood (ONE TOUCH ULTRA TEST) test strip 1 each by Other route 2 (two) times daily. 200 each 2  . Lancets MISC Use as directed up to 4 times per day 100 each 11  . liraglutide (VICTOZA) 18 MG/3ML SOPN Inject 1.8 mg into the skin.     Marland Kitchen lisinopril (PRINIVIL,ZESTRIL) 5 MG tablet Take 5 mg by mouth daily.    Marland Kitchen loratadine (CLARITIN) 10 MG tablet Take 10 mg by mouth daily.    Marland Kitchen LORazepam (ATIVAN) 1 MG tablet TAKE 1/2 TABLET BY MOUTH EVERY DAY AS NEEDED FOR ANXIETY 45 tablet 1  . Magnesium Oxide (MAG-OX 400 PO) Take 1 tablet by mouth 2 (two) times daily.    . Multiple Vitamins-Minerals (MULTIVITAMIN PO)  Take 1 tablet by mouth daily.    Marland Kitchen NITROSTAT 0.4 MG SL tablet Place 1 tablet (0.4 mg total) under the tongue every 5 (five) minutes as needed for chest pain. 25 tablet 5  . ranolazine (RANEXA) 500 MG 12 hr tablet Take 1 tablet (500 mg total) by mouth 2 (two) times daily. 60 tablet 6  . rosuvastatin (CRESTOR) 20 MG tablet TAKE 1 TABLET BY MOUTH EVERY DAY 90 tablet 0  . sacubitril-valsartan (ENTRESTO) 24-26 MG Take 1 tablet by mouth 2 (two) times daily.    Marland Kitchen  ULTICARE MICRO PEN NEEDLES 32G X 4 MM MISC USE DAILY AS DIRECTED 100 each 3  . Vitamin D, Ergocalciferol, (DRISDOL) 1.25 MG (50000 UNIT) CAPS capsule Take 1 capsule (50,000 Units total) by mouth every 7 (seven) days. 12 capsule 0  . XARELTO 20 MG TABS tablet TAKE 1 TABLET BY MOUTH EVERY DAY 30 tablet 5  . zolpidem (AMBIEN) 5 MG tablet TAKE 1 OR 2 TABLETS BY MOUTH NIGHTLY AT BEDTIME AS NEEDED 30 tablet 0   No current facility-administered medications on file prior to visit.    Allergies  Allergen Reactions  . Salmon [Fish Allergy] Other (See Comments)    Only canned/ not fresh (probably preservatives).   . Sulfa Antibiotics     Not sure has allergyu to this Only canned/ not fresh (probably preservatives).   . Metformin And Related Other (See Comments)    diarrhea    Family History  Problem Relation Age of Onset  . Diabetes Maternal Grandmother   . Hyperlipidemia Maternal Grandmother   . Breast cancer Mother   . Cancer Neg Hx   . COPD Neg Hx   . Heart disease Neg Hx     BP 118/74 (BP Location: Left Arm, Patient Position: Sitting, Cuff Size: Normal)   Pulse 94   Ht 5' 8.75" (1.746 m)   Wt 198 lb 9.6 oz (90.1 kg)   SpO2 (!) 86%   BMI 29.54 kg/m    Review of Systems Denies LOC    Objective:   Physical Exam VITAL SIGNS:  See vs page GENERAL: no distress Pulses: dorsalis pedis intact bilat.   MSK: no deformity of the feet CV: no leg edema Skin:  no ulcer on the feet, but there are heavy calluses on the feet.  normal  color and temp on the feet. Neuro: sensation is intact to touch on the feet, but decreased from normal Ext: there is bilateral onychomycosis of the toenails.    Lab Results  Component Value Date   CREATININE 1.28 (H) 09/11/2019   BUN 22 09/11/2019   NA 136 09/11/2019   K 4.6 09/11/2019   CL 101 09/11/2019   CO2 27 09/11/2019    Lab Results  Component Value Date   HGBA1C 7.0 (A) 12/10/2019       Assessment & Plan:  Insulin-requiring type 2 DM, with CRI Hypoglycemia, due to insulin: this limits aggressiveness of glycemic control  Patient Instructions  check your blood sugar twice a day.  vary the time of day when you check, between before the 3 meals, and at bedtime.  also check if you have symptoms of your blood sugar being too high or too low.  please keep a record of the readings and bring it to your next appointment here (or you can bring the meter itself).  You can write it on any piece of paper.  please call us sooner if your blood sugar goes below 70, or if you have a lot of readings over 200.   Please change the insulins to the numbers listed below.   Please come back for a follow-up appointment in 3 months.

## 2020-01-06 DIAGNOSIS — Z20822 Contact with and (suspected) exposure to covid-19: Secondary | ICD-10-CM | POA: Diagnosis not present

## 2020-01-06 DIAGNOSIS — R6889 Other general symptoms and signs: Secondary | ICD-10-CM | POA: Diagnosis not present

## 2020-01-16 ENCOUNTER — Other Ambulatory Visit: Payer: Self-pay | Admitting: Endocrinology

## 2020-01-16 DIAGNOSIS — Z1611 Resistance to penicillins: Secondary | ICD-10-CM | POA: Diagnosis not present

## 2020-01-16 DIAGNOSIS — M79671 Pain in right foot: Secondary | ICD-10-CM | POA: Diagnosis not present

## 2020-01-16 DIAGNOSIS — L97512 Non-pressure chronic ulcer of other part of right foot with fat layer exposed: Secondary | ICD-10-CM | POA: Diagnosis not present

## 2020-01-16 DIAGNOSIS — M2041 Other hammer toe(s) (acquired), right foot: Secondary | ICD-10-CM | POA: Diagnosis not present

## 2020-01-16 DIAGNOSIS — B957 Other staphylococcus as the cause of diseases classified elsewhere: Secondary | ICD-10-CM | POA: Diagnosis not present

## 2020-01-16 DIAGNOSIS — B9562 Methicillin resistant Staphylococcus aureus infection as the cause of diseases classified elsewhere: Secondary | ICD-10-CM | POA: Diagnosis not present

## 2020-01-16 DIAGNOSIS — E0842 Diabetes mellitus due to underlying condition with diabetic polyneuropathy: Secondary | ICD-10-CM

## 2020-02-05 DIAGNOSIS — I5020 Unspecified systolic (congestive) heart failure: Secondary | ICD-10-CM | POA: Diagnosis not present

## 2020-02-06 DIAGNOSIS — L97512 Non-pressure chronic ulcer of other part of right foot with fat layer exposed: Secondary | ICD-10-CM | POA: Diagnosis not present

## 2020-02-07 ENCOUNTER — Other Ambulatory Visit: Payer: Self-pay

## 2020-02-07 DIAGNOSIS — L98499 Non-pressure chronic ulcer of skin of other sites with unspecified severity: Secondary | ICD-10-CM

## 2020-02-11 ENCOUNTER — Ambulatory Visit: Payer: Medicare Other | Admitting: Vascular Surgery

## 2020-02-11 ENCOUNTER — Encounter: Payer: Self-pay | Admitting: Vascular Surgery

## 2020-02-11 ENCOUNTER — Ambulatory Visit (INDEPENDENT_AMBULATORY_CARE_PROVIDER_SITE_OTHER)
Admission: RE | Admit: 2020-02-11 | Discharge: 2020-02-11 | Disposition: A | Discharge status: Home / Self Care | Payer: Medicare Other | Source: Ambulatory Visit | Attending: Vascular Surgery | Admitting: Vascular Surgery

## 2020-02-11 ENCOUNTER — Inpatient Hospital Stay (HOSPITAL_COMMUNITY)
Admission: AD | Admit: 2020-02-11 | Discharge: 2020-02-15 | DRG: 240 | Disposition: A | Discharge status: Home Health | Payer: Medicare Other | Source: Ambulatory Visit | Attending: Vascular Surgery | Admitting: Vascular Surgery

## 2020-02-11 ENCOUNTER — Other Ambulatory Visit: Payer: Self-pay

## 2020-02-11 VITALS — BP 90/57 | HR 62 | Temp 98.4°F | Resp 20 | Ht 68.75 in | Wt 189.0 lb

## 2020-02-11 DIAGNOSIS — E291 Testicular hypofunction: Secondary | ICD-10-CM | POA: Diagnosis not present

## 2020-02-11 DIAGNOSIS — I5022 Chronic systolic (congestive) heart failure: Secondary | ICD-10-CM | POA: Diagnosis present

## 2020-02-11 DIAGNOSIS — I96 Gangrene, not elsewhere classified: Secondary | ICD-10-CM

## 2020-02-11 DIAGNOSIS — E1142 Type 2 diabetes mellitus with diabetic polyneuropathy: Secondary | ICD-10-CM | POA: Diagnosis present

## 2020-02-11 DIAGNOSIS — I9581 Postprocedural hypotension: Secondary | ICD-10-CM | POA: Diagnosis not present

## 2020-02-11 DIAGNOSIS — E1152 Type 2 diabetes mellitus with diabetic peripheral angiopathy with gangrene: Secondary | ICD-10-CM | POA: Diagnosis not present

## 2020-02-11 DIAGNOSIS — L97519 Non-pressure chronic ulcer of other part of right foot with unspecified severity: Secondary | ICD-10-CM | POA: Diagnosis present

## 2020-02-11 DIAGNOSIS — Z8674 Personal history of sudden cardiac arrest: Secondary | ICD-10-CM

## 2020-02-11 DIAGNOSIS — L98499 Non-pressure chronic ulcer of skin of other sites with unspecified severity: Secondary | ICD-10-CM | POA: Insufficient documentation

## 2020-02-11 DIAGNOSIS — E11621 Type 2 diabetes mellitus with foot ulcer: Secondary | ICD-10-CM | POA: Diagnosis not present

## 2020-02-11 DIAGNOSIS — I10 Essential (primary) hypertension: Secondary | ICD-10-CM | POA: Diagnosis not present

## 2020-02-11 DIAGNOSIS — Z833 Family history of diabetes mellitus: Secondary | ICD-10-CM

## 2020-02-11 DIAGNOSIS — Z95810 Presence of automatic (implantable) cardiac defibrillator: Secondary | ICD-10-CM

## 2020-02-11 DIAGNOSIS — Z882 Allergy status to sulfonamides status: Secondary | ICD-10-CM

## 2020-02-11 DIAGNOSIS — Z91013 Allergy to seafood: Secondary | ICD-10-CM | POA: Diagnosis not present

## 2020-02-11 DIAGNOSIS — E785 Hyperlipidemia, unspecified: Secondary | ICD-10-CM | POA: Diagnosis present

## 2020-02-11 DIAGNOSIS — Z888 Allergy status to other drugs, medicaments and biological substances status: Secondary | ICD-10-CM

## 2020-02-11 DIAGNOSIS — I255 Ischemic cardiomyopathy: Secondary | ICD-10-CM | POA: Diagnosis not present

## 2020-02-11 DIAGNOSIS — I252 Old myocardial infarction: Secondary | ICD-10-CM

## 2020-02-11 DIAGNOSIS — Z87442 Personal history of urinary calculi: Secondary | ICD-10-CM | POA: Diagnosis not present

## 2020-02-11 DIAGNOSIS — I70269 Atherosclerosis of native arteries of extremities with gangrene, unspecified extremity: Secondary | ICD-10-CM

## 2020-02-11 DIAGNOSIS — I2510 Atherosclerotic heart disease of native coronary artery without angina pectoris: Secondary | ICD-10-CM | POA: Diagnosis not present

## 2020-02-11 DIAGNOSIS — Z20822 Contact with and (suspected) exposure to covid-19: Secondary | ICD-10-CM | POA: Diagnosis not present

## 2020-02-11 DIAGNOSIS — Z83438 Family history of other disorder of lipoprotein metabolism and other lipidemia: Secondary | ICD-10-CM

## 2020-02-11 DIAGNOSIS — F419 Anxiety disorder, unspecified: Secondary | ICD-10-CM | POA: Diagnosis not present

## 2020-02-11 DIAGNOSIS — I739 Peripheral vascular disease, unspecified: Secondary | ICD-10-CM | POA: Diagnosis present

## 2020-02-11 LAB — CBC
HCT: 38.3 % — ABNORMAL LOW (ref 39.0–52.0)
Hemoglobin: 12.8 g/dL — ABNORMAL LOW (ref 13.0–17.0)
MCH: 29.6 pg (ref 26.0–34.0)
MCHC: 33.4 g/dL (ref 30.0–36.0)
MCV: 88.7 fL (ref 80.0–100.0)
Platelets: 245 K/uL (ref 150–400)
RBC: 4.32 MIL/uL (ref 4.22–5.81)
RDW: 12.2 % (ref 11.5–15.5)
WBC: 9.3 K/uL (ref 4.0–10.5)
nRBC: 0 % (ref 0.0–0.2)

## 2020-02-11 LAB — COMPREHENSIVE METABOLIC PANEL WITH GFR
ALT: 15 U/L (ref 0–44)
AST: 21 U/L (ref 15–41)
Albumin: 3 g/dL — ABNORMAL LOW (ref 3.5–5.0)
Alkaline Phosphatase: 70 U/L (ref 38–126)
Anion gap: 9 (ref 5–15)
BUN: 20 mg/dL (ref 8–23)
CO2: 24 mmol/L (ref 22–32)
Calcium: 9.1 mg/dL (ref 8.9–10.3)
Chloride: 100 mmol/L (ref 98–111)
Creatinine, Ser: 1.49 mg/dL — ABNORMAL HIGH (ref 0.61–1.24)
GFR, Estimated: 50 mL/min — ABNORMAL LOW (ref >60)
Glucose, Bld: 156 mg/dL — ABNORMAL HIGH (ref 70–99)
Potassium: 4.5 mmol/L (ref 3.5–5.1)
Sodium: 133 mmol/L — ABNORMAL LOW (ref 135–145)
Total Bilirubin: 0.6 mg/dL (ref 0.3–1.2)
Total Protein: 7 g/dL (ref 6.5–8.1)

## 2020-02-11 LAB — RESP PANEL BY RT-PCR (FLU A&B, COVID) ARPGX2
Influenza A by PCR: NEGATIVE
Influenza B by PCR: NEGATIVE
SARS Coronavirus 2 by RT PCR: NEGATIVE

## 2020-02-11 LAB — URINALYSIS, ROUTINE W REFLEX MICROSCOPIC
Bilirubin Urine: NEGATIVE
Glucose, UA: NEGATIVE mg/dL
Hgb urine dipstick: NEGATIVE
Ketones, ur: NEGATIVE mg/dL
Leukocytes,Ua: NEGATIVE
Nitrite: NEGATIVE
Protein, ur: NEGATIVE mg/dL
Specific Gravity, Urine: 1.017 (ref 1.005–1.030)
pH: 6 (ref 5.0–8.0)

## 2020-02-11 LAB — PROTIME-INR
INR: 1.6 — ABNORMAL HIGH (ref 0.8–1.2)
Prothrombin Time: 18 seconds — ABNORMAL HIGH (ref 11.4–15.2)

## 2020-02-11 LAB — GLUCOSE, CAPILLARY
Glucose-Capillary: 162 mg/dL — ABNORMAL HIGH (ref 70–99)
Glucose-Capillary: 229 mg/dL — ABNORMAL HIGH (ref 70–99)

## 2020-02-11 LAB — HEMOGLOBIN A1C
Hgb A1c MFr Bld: 6.9 % — ABNORMAL HIGH (ref 4.8–5.6)
Mean Plasma Glucose: 151.33 mg/dL

## 2020-02-11 MED ORDER — HEPARIN SODIUM (PORCINE) 5000 UNIT/ML IJ SOLN
5000.0000 [IU] | Freq: Three times a day (TID) | INTRAMUSCULAR | Status: DC
  Administered 2020-02-11: 5000 [IU] via SUBCUTANEOUS
  Filled 2020-02-11: qty 1

## 2020-02-11 MED ORDER — CEFAZOLIN SODIUM-DEXTROSE 2-4 GM/100ML-% IV SOLN
2.0000 g | Freq: Three times a day (TID) | INTRAVENOUS | Status: DC
  Administered 2020-02-11 – 2020-02-12 (×2): 2 g via INTRAVENOUS
  Filled 2020-02-11 (×3): qty 100

## 2020-02-11 MED ORDER — DOFETILIDE 500 MCG PO CAPS
500.0000 ug | ORAL_CAPSULE | Freq: Two times a day (BID) | ORAL | Status: DC
  Administered 2020-02-11 – 2020-02-15 (×7): 500 ug via ORAL
  Filled 2020-02-11 (×9): qty 1

## 2020-02-11 MED ORDER — KCL IN DEXTROSE-NACL 20-5-0.45 MEQ/L-%-% IV SOLN: INTRAVENOUS | Status: DC

## 2020-02-11 MED ORDER — LABETALOL HCL 5 MG/ML IV SOLN: 10.0000 mg | INTRAVENOUS | Status: DC | PRN

## 2020-02-11 MED ORDER — PHENOL 1.4 % MT LIQD: 1.0000 | OROMUCOSAL | Status: DC | PRN

## 2020-02-11 MED ORDER — METOPROLOL SUCCINATE ER 100 MG PO TB24
200.0000 mg | ORAL_TABLET | Freq: Every day | ORAL | Status: DC
  Administered 2020-02-12: 200 mg via ORAL
  Filled 2020-02-11 (×2): qty 2

## 2020-02-11 MED ORDER — SODIUM CHLORIDE 0.9 % IV SOLN: INTRAVENOUS | Status: DC

## 2020-02-11 MED ORDER — HYDRALAZINE HCL 20 MG/ML IJ SOLN: 5.0000 mg | INTRAMUSCULAR | Status: DC | PRN

## 2020-02-11 MED ORDER — INSULIN ASPART 100 UNIT/ML ~~LOC~~ SOLN: 0.0000 [IU] | Freq: Three times a day (TID) | SUBCUTANEOUS | Status: DC

## 2020-02-11 MED ORDER — LORAZEPAM 0.5 MG PO TABS
0.5000 mg | ORAL_TABLET | Freq: Every day | ORAL | Status: DC
  Administered 2020-02-11 – 2020-02-14 (×4): 0.5 mg via ORAL
  Filled 2020-02-11 (×4): qty 1

## 2020-02-11 MED ORDER — ONDANSETRON HCL 4 MG/2ML IJ SOLN: 4.0000 mg | Freq: Four times a day (QID) | INTRAMUSCULAR | Status: DC | PRN

## 2020-02-11 MED ORDER — MUPIROCIN 2 % EX OINT
1.0000 "application " | TOPICAL_OINTMENT | Freq: Two times a day (BID) | CUTANEOUS | Status: DC
  Administered 2020-02-12 – 2020-02-14 (×4): 1 via NASAL
  Filled 2020-02-11: qty 22

## 2020-02-11 MED ORDER — ROSUVASTATIN CALCIUM 20 MG PO TABS
20.0000 mg | ORAL_TABLET | Freq: Every day | ORAL | Status: DC
  Administered 2020-02-12 – 2020-02-14 (×3): 20 mg via ORAL
  Filled 2020-02-11 (×3): qty 1

## 2020-02-11 MED ORDER — OXYCODONE HCL 5 MG PO TABS
5.0000 mg | ORAL_TABLET | ORAL | Status: DC | PRN
  Administered 2020-02-12 (×2): 5 mg via ORAL
  Administered 2020-02-14 – 2020-02-15 (×3): 10 mg via ORAL
  Filled 2020-02-11: qty 2
  Filled 2020-02-11: qty 1
  Filled 2020-02-11 (×2): qty 2
  Filled 2020-02-11: qty 1

## 2020-02-11 MED ORDER — INSULIN ASPART 100 UNIT/ML ~~LOC~~ SOLN
0.0000 [IU] | Freq: Three times a day (TID) | SUBCUTANEOUS | Status: DC
  Administered 2020-02-11: 3 [IU] via SUBCUTANEOUS
  Administered 2020-02-12: 2 [IU] via SUBCUTANEOUS
  Administered 2020-02-12: 5 [IU] via SUBCUTANEOUS
  Administered 2020-02-13: 3 [IU] via SUBCUTANEOUS
  Administered 2020-02-13: 5 [IU] via SUBCUTANEOUS
  Administered 2020-02-13 – 2020-02-14 (×2): 3 [IU] via SUBCUTANEOUS
  Administered 2020-02-14: 5 [IU] via SUBCUTANEOUS
  Administered 2020-02-14: 8 [IU] via SUBCUTANEOUS
  Administered 2020-02-15: 3 [IU] via SUBCUTANEOUS
  Administered 2020-02-15: 8 [IU] via SUBCUTANEOUS

## 2020-02-11 MED ORDER — POTASSIUM CHLORIDE CRYS ER 20 MEQ PO TBCR: 20.0000 meq | EXTENDED_RELEASE_TABLET | Freq: Once | ORAL | Status: DC

## 2020-02-11 MED ORDER — ALUM & MAG HYDROXIDE-SIMETH 200-200-20 MG/5ML PO SUSP: 15.0000 mL | ORAL | Status: DC | PRN

## 2020-02-11 MED ORDER — MAGNESIUM OXIDE 400 (241.3 MG) MG PO TABS
400.0000 mg | ORAL_TABLET | Freq: Two times a day (BID) | ORAL | Status: DC
  Administered 2020-02-11 – 2020-02-15 (×7): 400 mg via ORAL
  Filled 2020-02-11 (×7): qty 1

## 2020-02-11 MED ORDER — LORAZEPAM 0.5 MG PO TABS: 0.5000 mg | ORAL_TABLET | Freq: Every day | ORAL | Status: DC

## 2020-02-11 MED ORDER — METOPROLOL TARTRATE 5 MG/5ML IV SOLN: 2.0000 mg | INTRAVENOUS | Status: DC | PRN

## 2020-02-11 MED ORDER — PANTOPRAZOLE SODIUM 40 MG PO TBEC
40.0000 mg | DELAYED_RELEASE_TABLET | Freq: Every day | ORAL | Status: DC
  Administered 2020-02-13 – 2020-02-15 (×3): 40 mg via ORAL
  Filled 2020-02-11 (×3): qty 1

## 2020-02-11 MED ORDER — ZOLPIDEM TARTRATE 5 MG PO TABS
5.0000 mg | ORAL_TABLET | Freq: Every evening | ORAL | Status: DC | PRN
  Administered 2020-02-11: 5 mg via ORAL
  Filled 2020-02-11: qty 1

## 2020-02-11 MED ORDER — GUAIFENESIN-DM 100-10 MG/5ML PO SYRP: 15.0000 mL | ORAL_SOLUTION | ORAL | Status: DC | PRN

## 2020-02-11 MED ORDER — ROSUVASTATIN CALCIUM 20 MG PO TABS: 20.0000 mg | ORAL_TABLET | Freq: Every day | ORAL | Status: DC

## 2020-02-11 MED ORDER — SACUBITRIL-VALSARTAN 24-26 MG PO TABS
1.0000 | ORAL_TABLET | Freq: Two times a day (BID) | ORAL | Status: DC
  Administered 2020-02-11 – 2020-02-15 (×4): 1 via ORAL
  Filled 2020-02-11 (×5): qty 1

## 2020-02-11 NOTE — Progress Notes (Signed)
ASSESSMENT & PLAN:  74 y.o. male with wet gangrene of the right toes 1-5. 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 Needs admission to the hospital and IV antibiotics. Check XR of foot. Plan R TMA tomorrow. May leave open if purulence encountered.   CHIEF COMPLAINT:   Right foot ulceration  HISTORY:  HISTORY OF PRESENT ILLNESS: [name] is a 74 y.o. male referred to clinic for evaluation of gangrene of the right toes.  This started initially as a blistering rash over his first and second toes several weeks ago.  The ulceration has progressed rather quickly and now involves all of his toes.  There is serous drainage from the wounds.  I cannot express any purulence on my exam.  He is not tender about any of the toes.  He has no constitutional symptoms of systemic infection.  Past Medical History:  Diagnosis Date  . CALLUS, RIGHT FOOT 12/08/2009  . Cardiac arrest ([place])   . Chronic systolic heart failure ([place]) 11/03/2009  . DIABETES MELLITUS, TYPE II 12/05/2006  . 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 HYPERLIPIDEMIA 11/25/2006  . HYPOGONADISM, MALE 12/05/2006  . Implantable cardiac defibrillator MDT 09/22/2008   Change out feb 2011, 2015  . Ischemic cardiomyopathy 09/22/2008   DES CX and RCA  70% residual LAD  Done in [place]; EF 30%;; myoviewq 06/19/2006 no ischemia  . Kidney stones   . Long term (current) use of anticoagulants 04/25/2010  . Myocardial infarction [facility]) 06/18/2004   "I died and they brought me back"  . 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 NEPHROLITHIASIS, HX OF 02/25/2008  . Persistent atrial fibrillation ([place]) 11/25/2006   Inapp shock June 19, 2010 AF VR >220 recurrent    Past Surgical History:  Procedure Laterality Date  . CARDIAC CATHETERIZATION     feburary 2012-06-18  . CARDIAC DEFIBRILLATOR PLACEMENT     Guidant Vitality [name]  . CARDIOVERSION N/A 04/25/2012   Procedure: CARDIOVERSION;  Surgeon: Darlin Coco, MD;  Location: [facility] ENDOSCOPY;   Service: Cardiovascular;  Laterality: N/A;  . CARDIOVERSION N/A 05/21/2012   Procedure: CARDIOVERSION;  Surgeon: Deboraha Sprang, MD;  Location: [place];  Service: Cardiovascular;  Laterality: N/A;  . CATARACT EXTRACTION    . EYE SURGERY     cataracts  . IMPLANTABLE CARDIOVERTER DEFIBRILLATOR (ICD) GENERATOR CHANGE N/A 02/05/2014   Procedure: ICD GENERATOR CHANGE;  Surgeon: Deboraha Sprang, MD;  Location: [facility] CATH LAB;  Service: Cardiovascular;  Laterality: N/A;  . PTCA    . TONSILLECTOMY    . TONSILLECTOMY      Family History  Problem Relation Age of Onset  . Diabetes Maternal Grandmother   . Hyperlipidemia Maternal Grandmother   . Breast cancer Mother   . Cancer Neg Hx   . COPD Neg Hx   . Heart disease Neg Hx     Social History   Socioeconomic History  . Marital status: Divorced    Spouse name: Not on file  . Number of children: 1  . Years of education: 79  . Highest education level: Not on file  Occupational History  . 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 Smoking status: Never Smoker  . Smokeless tobacco: Never Used  Vaping Use  . Vaping Use: Never used  Substance and Sexual Activity  . Alcohol use: Yes    Alcohol/week: 0.0 standard drinks    Comment: summer   .  Drug use: No  . Sexual activity: Yes    Partners: Female  Other Topics Concern  . Not on file  Social History Narrative   HSG, College grad. Married - divorced. 1 daughter. owner/operator Research officer, trade union business and show production   Social Determinants of Radio broadcast assistant Strain: Not on file  Food Insecurity: Not on file  Transportation Needs: Not on file  Physical Activity: Not on file  Stress: Not on file  Social Connections: Not on file  Intimate Partner Violence: Not on file    Allergies  Allergen Reactions  . Salmon [Fish Allergy] Other (See Comments)    Only canned/ not fresh (probably  preservatives).   . Sulfa Antibiotics     Not sure has allergyu to this Only canned/ not fresh (probably preservatives).   . Metformin And Related Other (See Comments)    diarrhea    Current Outpatient Medications  Medication Sig Dispense Refill  . carvedilol (COREG) 12.5 MG tablet Take 1 tablet (12.5 mg total) by mouth 2 (two) times daily with a meal. 180 tablet 5  . citalopram (CELEXA) 10 MG tablet TAKE 1 TABLET BY MOUTH EVERY DAY 90 tablet 3  . Continuous Blood Gluc Receiver (FREESTYLE LIBRE 14 DAY READER) DEVI USE AS DIRECTED FOR 1 DOSE 1 each 0  . Continuous Blood Gluc Sensor (FREESTYLE LIBRE 14 DAY SENSOR) MISC APPLY sensor EVERY 14 DAYS 6 each 3  . dofetilide (TIKOSYN) 500 MCG capsule TAKE 1 CAPSULE BY MOUTH 2 TIMES DAILY 60 capsule 0  . fluticasone (FLONASE) 50 MCG/ACT nasal spray USE 2 SPRAYS IN EACH NOSTRIL EVERY DAY 16 g 5  . furosemide (LASIX) 20 MG tablet Take 1 tablet by mouth daily. OVERDUE FOR FOLLOW UP. PLEASE CALL AND SCHEDULE 15 tablet 0  . Glucose Blood (FREESTYLE PRECISION NEO TEST VI) by In Vitro route.    Marland Kitchen glucose blood (ONE TOUCH ULTRA TEST) test strip 1 each by Other route 2 (two) times daily. 200 each 2  . insulin glargine (LANTUS SOLOSTAR) 100 UNIT/ML Solostar Pen Inject 14 Units into the skin daily. 15 mL 11  . insulin lispro (ADMELOG) 100 UNIT/ML injection Inject 0.1 mLs (10 Units total) into the skin 3 (three) times daily before meals. 10 mL 11  . Lancets MISC Use as directed up to 4 times per day 100 each 11  . liraglutide (VICTOZA) 18 MG/3ML SOPN Inject 1.8 mg into the skin.     Marland Kitchen lisinopril (PRINIVIL,ZESTRIL) 5 MG tablet Take 5 mg by mouth daily.    Marland Kitchen loratadine (CLARITIN) 10 MG tablet Take 10 mg by mouth daily.    Marland Kitchen LORazepam (ATIVAN) 1 MG tablet TAKE 1/2 TABLET BY MOUTH EVERY DAY AS NEEDED FOR ANXIETY 45 tablet 1  . Magnesium Oxide (MAG-OX 400 PO) Take 1 tablet by mouth 2 (two) times daily.    . Multiple Vitamins-Minerals (MULTIVITAMIN PO) Take 1 tablet  by mouth daily.    Marland Kitchen NITROSTAT 0.4 MG SL tablet Place 1 tablet (0.4 mg total) under the tongue every 5 (five) minutes as needed for chest pain. 25 tablet 5  . ranolazine (RANEXA) 500 MG 12 hr tablet Take 1 tablet (500 mg total) by mouth 2 (two) times daily. 60 tablet 6  . rosuvastatin (CRESTOR) 20 MG tablet TAKE 1 TABLET BY MOUTH EVERY DAY 90 tablet 0  . sacubitril-valsartan (ENTRESTO) 24-26 MG Take 1 tablet by mouth 2 (two) times daily.    Marland Kitchen ULTICARE MICRO PEN NEEDLES 32G X 4  MM MISC USE DAILY AS DIRECTED 100 each 3  . Vitamin D, Ergocalciferol, (DRISDOL) 1.25 MG (50000 UNIT) CAPS capsule Take 1 capsule (50,000 Units total) by mouth every 7 (seven) days. 12 capsule 0  . XARELTO 20 MG TABS tablet TAKE 1 TABLET BY MOUTH EVERY DAY 30 tablet 5  . zolpidem (AMBIEN) 5 MG tablet TAKE 1 OR 2 TABLETS BY MOUTH NIGHTLY AT BEDTIME AS NEEDED 30 tablet 0   No current facility-administered medications for this visit.    REVIEW OF SYSTEMS:  [X]  denotes positive finding, [ ]  denotes negative finding Cardiac  Comments:  Chest pain or chest pressure:    Shortness of breath upon exertion:    Short of breath when lying flat:    Irregular heart rhythm:        Vascular    Pain in calf, thigh, or hip brought on by ambulation:    Pain in feet at night that wakes you up from your sleep:     Blood clot in your veins:    Leg swelling:         Pulmonary    Oxygen at home:    Productive cough:     Wheezing:         Neurologic    Sudden weakness in arms or legs:     Sudden numbness in arms or legs:     Sudden onset of difficulty speaking or slurred speech:    Temporary loss of vision in one eye:     Problems with dizziness:         Gastrointestinal    Blood in stool:     Vomited blood:         Genitourinary    Burning when urinating:     Blood in urine:        Psychiatric    Major depression:         Hematologic    Bleeding problems:    Problems with blood clotting too easily:        Skin     Rashes or ulcers:        Constitutional    Fever or chills:     PHYSICAL EXAM:   Vitals:   02/11/20 1431  BP: (!) 90/57  Pulse: 62  Resp: 20  Temp: 98.4 F (36.9 C)  SpO2: 100%  Weight: 189 lb (85.7 kg)  Height: 5' 8.75" (1.746 m)   Constitutional: Well appearing in no distress. Appears well nourished.  Neurologic: Normal gait and station. CN intact. No weakness. No sensory loss. Psychiatric: Mood and affect symmetric and appropriate. Eyes: No icterus. No conjunctival pallor. Ears, nose, throat: mucous membranes moist. Midline trachea. No carotid bruit. Cardiac: regular rate and rhythm.  Respiratory: unlabored. Abdominal: soft, non-tender, non-distended. No palpable pulsatile abdominal mass. Peripheral vascular:  Dorsalis pedis pulse: L 2+ / R 2+  Posterior tibial pulse: L 2+ / R 2+ Extremity: No edema. No cyanosis. No pallor.  Skin: Wet gangrene of right forefoot. All toes involved. Lymphatic: No Stemmer's sign. No palpable lymphadenopathy.   DATA REVIEW:    Most recent CBC CBC Latest Ref Rng & Units 09/11/2019 10/17/2017 03/18/2017  WBC 3.8 - 10.8 Thousand/uL 7.8 6.4 6.3  Hemoglobin 13.2 - 17.1 g/dL 15.0 14.5 13.8  Hematocrit 38.5 - 50.0 % 44.4 42.8 41.0  Platelets 140 - 400 Thousand/uL 161 147.0(L) 147(L)     Most recent CMP CMP Latest Ref Rng & Units 09/11/2019 01/03/2019 10/17/2017  Glucose 65 -  99 mg/dL 144(H) 163(H) 148(H)  BUN 7 - 25 mg/dL 22 23 27(H)  Creatinine 0.70 - 1.18 mg/dL 1.28(H) 1.16 1.29  Sodium 135 - 146 mmol/L 136 134(L) 136  Potassium 3.5 - 5.3 mmol/L 4.6 4.4 4.7  Chloride 98 - 110 mmol/L 101 99 98  CO2 20 - 32 mmol/L 27 27 28   Calcium 8.6 - 10.3 mg/dL 9.4 9.6 9.6  Total Protein 6.1 - 8.1 g/dL 7.2 - 7.4  Total Bilirubin 0.2 - 1.2 mg/dL 0.5 - 0.9  Alkaline Phos 39 - 117 U/L - - 75  AST 10 - 35 U/L 18 - 13  ALT 9 - 46 U/L 15 - 11    Renal function CrCl cannot be calculated (Patient's most recent lab result is older than the maximum 21 days  allowed.).  Hemoglobin A1C (%)  Date Value  12/10/2019 7.0 (A)   Hgb A1c MFr Bld (%)  Date Value  10/17/2017 7.1 (H)    LDL Cholesterol (Calc)  Date Value Ref Range Status  09/11/2019 49 mg/dL (calc) Final    Comment:    Reference range: <100 . Desirable range <100 mg/dL for primary prevention;   <70 mg/dL for patients with CHD or diabetic patients  with > or = 2 CHD risk factors. Marland Kitchen LDL-C is now calculated using the Martin-Hopkins  calculation, which is a validated novel method providing  better accuracy than the Friedewald equation in the  estimation of LDL-C.  Cresenciano Genre et al. Annamaria Helling. 0354;656(81): 2061-2068  (http://education.[facility].com/faq/FAQ164)    Direct LDL  Date Value Ref Range Status  10/17/2017 134.0 mg/dL Final    Comment:    Optimal:  <100 mg/dLNear or Above Optimal:  100-129 mg/dLBorderline High:  130-159 mg/dLHigh:  160-189 mg/dLVery High:  >190 mg/dL    LOWER EXTREMITY DOPPLER STUDY   Indications: Ulceration.    Performing Technologist: Ralene Cork RVT     Examination Guidelines: A complete evaluation includes at minimum, Doppler  waveform signals and systolic blood pressure reading at the level of  bilateral  brachial, anterior tibial, and posterior tibial arteries, when vessel  segments  are accessible. Bilateral testing is considered an integral part of a  complete  examination. Photoelectric Plethysmograph (PPG) waveforms and toe systolic  pressure readings are included as required and additional duplex testing  as  needed. Limited examinations for reoccurring indications may be performed  as  noted.     ABI Findings:  +---------+------------------+-----+----------+--------+  Right  Rt Pressure (mmHg)IndexWaveform Comment   +---------+------------------+-----+----------+--------+  Brachial 103                      +---------+------------------+-----+----------+--------+  PTA   130         1.26 monophasic      +---------+------------------+-----+----------+--------+  DP    120        1.17 triphasic       +---------+------------------+-----+----------+--------+  Great Toe                 wounds   +---------+------------------+-----+----------+--------+   +---------+------------------+-----+--------+-------+  Left   Lt Pressure (mmHg)IndexWaveformComment  +---------+------------------+-----+--------+-------+  Brachial 101                     +---------+------------------+-----+--------+-------+  PTA   120        1.17 biphasic      +---------+------------------+-----+--------+-------+  DP    124        1.20 biphasic      +---------+------------------+-----+--------+-------+  [place]  Toe0         0.00           +---------+------------------+-----+--------+-------+   +-------+-----------+-----------+------------+------------+  ABI/TBIToday's ABIToday's TBIPrevious ABIPrevious TBI  +-------+-----------+-----------+------------+------------+  Right 1.26    NA                   +-------+-----------+-----------+------------+------------+  Left  1.2    0                   +-------+-----------+-----------+------------+------------+    No previous ABI.  Cannot rule out falsely elevated pedal pressures.    Summary:  Right: Resting right ankle-brachial index is within normal range. No  evidence of significant right lower extremity arterial disease.   Left: Resting left ankle-brachial index is within normal range. No  evidence of significant left lower extremity arterial disease. The left  toe-brachial index is abnormal.    Yevonne Aline. Stanford Breed, MD [facility] Phone Number: [phone] 02/11/2020 4:52 PM

## 2020-02-11 NOTE — Progress Notes (Signed)
Patient to room 4E13 from home. Vital signs obtained. On monitor CCMD notified. MD notified for orders. CHG bath completed. Alert and oriented to room and call light. Call bell within reach.  Era Bumpers, RN

## 2020-02-11 NOTE — Progress Notes (Signed)
Patient was concerned about getting his night time home medicines, especially the ones to help sleep.  Dr. Scot Dock was paged and he said for the nurse to go ahead and order his home medicine list.    Lupita Dawn, RN

## 2020-02-12 ENCOUNTER — Inpatient Hospital Stay (HOSPITAL_COMMUNITY): Payer: Medicare Other | Admitting: Certified Registered"

## 2020-02-12 ENCOUNTER — Encounter (HOSPITAL_COMMUNITY): Payer: Self-pay | Admitting: Vascular Surgery

## 2020-02-12 ENCOUNTER — Encounter (HOSPITAL_COMMUNITY)
Admission: AD | Disposition: A | Discharge status: Home Health | Payer: Self-pay | Source: Ambulatory Visit | Attending: Vascular Surgery

## 2020-02-12 ENCOUNTER — Inpatient Hospital Stay (HOSPITAL_COMMUNITY): Admission: RE | Admit: 2020-02-12 | Payer: Medicare Other | Source: Home / Self Care

## 2020-02-12 DIAGNOSIS — I96 Gangrene, not elsewhere classified: Secondary | ICD-10-CM

## 2020-02-12 HISTORY — PX: TRANSMETATARSAL AMPUTATION: SHX6197

## 2020-02-12 LAB — CBC
HCT: 36.8 % — ABNORMAL LOW (ref 39.0–52.0)
Hemoglobin: 12.2 g/dL — ABNORMAL LOW (ref 13.0–17.0)
MCH: 29.7 pg (ref 26.0–34.0)
MCHC: 33.2 g/dL (ref 30.0–36.0)
MCV: 89.5 fL (ref 80.0–100.0)
Platelets: 206 10*3/uL (ref 150–400)
RBC: 4.11 MIL/uL — ABNORMAL LOW (ref 4.22–5.81)
RDW: 12.3 % (ref 11.5–15.5)
WBC: 7.7 10*3/uL (ref 4.0–10.5)
nRBC: 0 % (ref 0.0–0.2)

## 2020-02-12 LAB — GLUCOSE, CAPILLARY
Glucose-Capillary: 143 mg/dL — ABNORMAL HIGH (ref 70–99)
Glucose-Capillary: 130 mg/dL — ABNORMAL HIGH (ref 70–99)
Glucose-Capillary: 170 mg/dL — ABNORMAL HIGH (ref 70–99)
Glucose-Capillary: 238 mg/dL — ABNORMAL HIGH (ref 70–99)
Glucose-Capillary: 201 mg/dL — ABNORMAL HIGH (ref 70–99)

## 2020-02-12 LAB — CREATININE, SERUM
Creatinine, Ser: 1.2 mg/dL (ref 0.61–1.24)
GFR, Estimated: >60 mL/min (ref >60)

## 2020-02-12 LAB — SURGICAL PCR SCREEN
MRSA, PCR: NEGATIVE
Staphylococcus aureus: NEGATIVE

## 2020-02-12 LAB — MAGNESIUM: Magnesium: 2 mg/dL (ref 1.7–2.4)

## 2020-02-12 SURGERY — AMPUTATION, FOOT, TRANSMETATARSAL
Anesthesia: Monitor Anesthesia Care | Site: Toe | Laterality: Right

## 2020-02-12 MED ORDER — EPHEDRINE SULFATE-NACL 50-0.9 MG/10ML-% IV SOSY
PREFILLED_SYRINGE | INTRAVENOUS | Status: DC | PRN
  Administered 2020-02-12 (×4): 5 mg via INTRAVENOUS
  Administered 2020-02-12: 15 mg via INTRAVENOUS
  Administered 2020-02-12 (×2): 5 mg via INTRAVENOUS

## 2020-02-12 MED ORDER — OXYCODONE HCL 5 MG/5ML PO SOLN: 5.0000 mg | Freq: Once | ORAL | Status: DC | PRN

## 2020-02-12 MED ORDER — PHENYLEPHRINE 40 MCG/ML (10ML) SYRINGE FOR IV PUSH (FOR BLOOD PRESSURE SUPPORT)
PREFILLED_SYRINGE | INTRAVENOUS | Status: DC | PRN
  Administered 2020-02-12: 120 ug via INTRAVENOUS
  Administered 2020-02-12: 80 ug via INTRAVENOUS

## 2020-02-12 MED ORDER — ACETAMINOPHEN 650 MG RE SUPP: 325.0000 mg | RECTAL | Status: DC | PRN

## 2020-02-12 MED ORDER — 0.9 % SODIUM CHLORIDE (POUR BTL) OPTIME
TOPICAL | Status: DC | PRN
  Administered 2020-02-12: 1000 mL

## 2020-02-12 MED ORDER — PIPERACILLIN-TAZOBACTAM 3.375 G IVPB: 3.3750 g | Freq: Four times a day (QID) | INTRAVENOUS | Status: DC

## 2020-02-12 MED ORDER — VANCOMYCIN HCL IN DEXTROSE 1-5 GM/200ML-% IV SOLN
INTRAVENOUS | Status: AC
  Administered 2020-02-12: 1000 mg via INTRAVENOUS
  Filled 2020-02-12: qty 200

## 2020-02-12 MED ORDER — OXYCODONE-ACETAMINOPHEN 5-325 MG PO TABS
1.0000 | ORAL_TABLET | ORAL | Status: DC | PRN
  Administered 2020-02-12 – 2020-02-13 (×2): 2 via ORAL
  Filled 2020-02-12 (×2): qty 2

## 2020-02-12 MED ORDER — PROPOFOL 10 MG/ML IV BOLUS
INTRAVENOUS | Status: AC
  Filled 2020-02-12: qty 20

## 2020-02-12 MED ORDER — FENTANYL CITRATE (PF) 100 MCG/2ML IJ SOLN
INTRAMUSCULAR | Status: AC
  Administered 2020-02-12: 50 ug via INTRAVENOUS
  Filled 2020-02-12: qty 2

## 2020-02-12 MED ORDER — ACETAMINOPHEN 325 MG PO TABS
325.0000 mg | ORAL_TABLET | ORAL | Status: DC | PRN
  Administered 2020-02-13 – 2020-02-14 (×6): 650 mg via ORAL
  Filled 2020-02-12 (×3): qty 2
  Filled 2020-02-12 (×2): qty 1
  Filled 2020-02-12 (×2): qty 2
  Filled 2020-02-12: qty 1
  Filled 2020-02-12: qty 2

## 2020-02-12 MED ORDER — OXYCODONE HCL 5 MG PO TABS: 5.0000 mg | ORAL_TABLET | Freq: Once | ORAL | Status: DC | PRN

## 2020-02-12 MED ORDER — EPHEDRINE 5 MG/ML INJ
INTRAVENOUS | Status: AC
  Filled 2020-02-12: qty 10

## 2020-02-12 MED ORDER — LACTATED RINGERS IV SOLN: INTRAVENOUS | Status: DC

## 2020-02-12 MED ORDER — FENTANYL CITRATE (PF) 100 MCG/2ML IJ SOLN: 50.0000 ug | Freq: Once | INTRAMUSCULAR | Status: AC

## 2020-02-12 MED ORDER — MIDAZOLAM HCL 2 MG/2ML IJ SOLN: 1.0000 mg | Freq: Once | INTRAMUSCULAR | Status: AC

## 2020-02-12 MED ORDER — PIPERACILLIN-TAZOBACTAM 3.375 G IVPB
3.3750 g | Freq: Three times a day (TID) | INTRAVENOUS | Status: DC
  Administered 2020-02-12 – 2020-02-15 (×9): 3.375 g via INTRAVENOUS
  Filled 2020-02-12 (×9): qty 50

## 2020-02-12 MED ORDER — CHLORHEXIDINE GLUCONATE 0.12 % MT SOLN: 15.0000 mL | OROMUCOSAL | Status: AC

## 2020-02-12 MED ORDER — PHENYLEPHRINE 40 MCG/ML (10ML) SYRINGE FOR IV PUSH (FOR BLOOD PRESSURE SUPPORT)
PREFILLED_SYRINGE | INTRAVENOUS | Status: AC
  Filled 2020-02-12: qty 10

## 2020-02-12 MED ORDER — FENTANYL CITRATE (PF) 250 MCG/5ML IJ SOLN
INTRAMUSCULAR | Status: AC
  Filled 2020-02-12: qty 5

## 2020-02-12 MED ORDER — MAGNESIUM SULFATE 2 GM/50ML IV SOLN: 2.0000 g | Freq: Every day | INTRAVENOUS | Status: DC | PRN

## 2020-02-12 MED ORDER — POTASSIUM CHLORIDE CRYS ER 20 MEQ PO TBCR: 20.0000 meq | EXTENDED_RELEASE_TABLET | Freq: Every day | ORAL | Status: DC | PRN

## 2020-02-12 MED ORDER — PROPOFOL 500 MG/50ML IV EMUL
INTRAVENOUS | Status: DC | PRN
  Administered 2020-02-12: 100 ug/kg/min via INTRAVENOUS

## 2020-02-12 MED ORDER — VANCOMYCIN HCL IN DEXTROSE 1-5 GM/200ML-% IV SOLN: 1000.0000 mg | Freq: Once | INTRAVENOUS | Status: AC

## 2020-02-12 MED ORDER — SODIUM CHLORIDE 0.9 % IV SOLN: INTRAVENOUS | Status: DC

## 2020-02-12 MED ORDER — MIDAZOLAM HCL 2 MG/2ML IJ SOLN
INTRAMUSCULAR | Status: AC
  Administered 2020-02-12: 1 mg via INTRAVENOUS
  Filled 2020-02-12: qty 2

## 2020-02-12 MED ORDER — CHLORHEXIDINE GLUCONATE 0.12 % MT SOLN: 15.0000 mL | Freq: Once | OROMUCOSAL | Status: AC

## 2020-02-12 MED ORDER — ONDANSETRON HCL 4 MG/2ML IJ SOLN: 4.0000 mg | Freq: Once | INTRAMUSCULAR | Status: DC | PRN

## 2020-02-12 MED ORDER — FENTANYL CITRATE (PF) 100 MCG/2ML IJ SOLN: 25.0000 ug | INTRAMUSCULAR | Status: DC | PRN

## 2020-02-12 MED ORDER — HYDROMORPHONE HCL 1 MG/ML IJ SOLN
0.5000 mg | INTRAMUSCULAR | Status: DC | PRN
  Administered 2020-02-12 (×2): 1 mg via INTRAVENOUS
  Administered 2020-02-14: 0.5 mg via INTRAVENOUS
  Filled 2020-02-12 (×3): qty 1

## 2020-02-12 MED ORDER — DOCUSATE SODIUM 100 MG PO CAPS
100.0000 mg | ORAL_CAPSULE | Freq: Every day | ORAL | Status: DC
  Administered 2020-02-13 – 2020-02-15 (×3): 100 mg via ORAL
  Filled 2020-02-12 (×3): qty 1

## 2020-02-12 MED ORDER — CHLORHEXIDINE GLUCONATE 0.12 % MT SOLN
OROMUCOSAL | Status: AC
  Administered 2020-02-12: 15 mL via OROMUCOSAL
  Filled 2020-02-12: qty 15

## 2020-02-12 MED ORDER — HEPARIN SODIUM (PORCINE) 5000 UNIT/ML IJ SOLN
5000.0000 [IU] | Freq: Three times a day (TID) | INTRAMUSCULAR | Status: DC
  Administered 2020-02-13: 5000 [IU] via SUBCUTANEOUS
  Filled 2020-02-12: qty 1

## 2020-02-12 SURGICAL SUPPLY — 38 items
BLADE AVERAGE 25X9 (BLADE) ×1 IMPLANT
BLADE CORE FAN STRYKER (BLADE) ×2 IMPLANT
BLADE SAW SAG 73X25 THK (BLADE) ×1
BLADE SAW SGTL 73X25 THK (BLADE) IMPLANT
BNDG ELASTIC 4X5.8 VLCR STR LF (GAUZE/BANDAGES/DRESSINGS) ×2 IMPLANT
BNDG GAUZE ELAST 4 BULKY (GAUZE/BANDAGES/DRESSINGS) ×2 IMPLANT
CANISTER SUCT 3000ML PPV (MISCELLANEOUS) ×2 IMPLANT
COVER SURGICAL LIGHT HANDLE (MISCELLANEOUS) ×2 IMPLANT
COVER WAND RF STERILE (DRAPES) ×1 IMPLANT
DRAPE EXTREMITY T 121X128X90 (DISPOSABLE) ×2 IMPLANT
DRAPE HALF SHEET 40X57 (DRAPES) ×1 IMPLANT
DRSG ADAPTIC 3X8 NADH LF (GAUZE/BANDAGES/DRESSINGS) ×2 IMPLANT
ELECT REM PT RETURN 9FT ADLT (ELECTROSURGICAL) ×2
ELECTRODE REM PT RTRN 9FT ADLT (ELECTROSURGICAL) ×1 IMPLANT
GAUZE SPONGE 4X4 12PLY STRL (GAUZE/BANDAGES/DRESSINGS) ×2 IMPLANT
GAUZE SPONGE 4X4 12PLY STRL LF (GAUZE/BANDAGES/DRESSINGS) ×1 IMPLANT
GLOVE BIO SURGEON STRL SZ7.5 (GLOVE) ×2 IMPLANT
GLOVE BIOGEL PI IND STRL 8 (GLOVE) ×1 IMPLANT
GLOVE BIOGEL PI INDICATOR 8 (GLOVE) ×1
GOWN STRL REUS W/ TWL LRG LVL3 (GOWN DISPOSABLE) ×2 IMPLANT
GOWN STRL REUS W/ TWL XL LVL3 (GOWN DISPOSABLE) ×1 IMPLANT
GOWN STRL REUS W/TWL LRG LVL3 (GOWN DISPOSABLE) ×4
GOWN STRL REUS W/TWL XL LVL3 (GOWN DISPOSABLE) ×2
KIT BASIN OR (CUSTOM PROCEDURE TRAY) ×2 IMPLANT
KIT TURNOVER KIT B (KITS) ×2 IMPLANT
NS IRRIG 1000ML POUR BTL (IV SOLUTION) ×2 IMPLANT
PACK GENERAL/GYN (CUSTOM PROCEDURE TRAY) ×2 IMPLANT
PAD ARMBOARD 7.5X6 YLW CONV (MISCELLANEOUS) ×4 IMPLANT
SUT ETHILON 2 0 FS 18 (SUTURE) ×8 IMPLANT
SUT ETHILON 2 0 PSLX (SUTURE) IMPLANT
SUT SILK 3 0 SH CR/8 (SUTURE) ×2 IMPLANT
SUT VIC AB 3-0 SH 27 (SUTURE) ×4
SUT VIC AB 3-0 SH 27X BRD (SUTURE) IMPLANT
SUT VIC AB 3-0 SH 8-18 (SUTURE) ×1 IMPLANT
TOWEL GREEN STERILE (TOWEL DISPOSABLE) ×4 IMPLANT
TOWEL GREEN STERILE FF (TOWEL DISPOSABLE) ×2 IMPLANT
UNDERPAD 30X36 HEAVY ABSORB (UNDERPADS AND DIAPERS) ×2 IMPLANT
WATER STERILE IRR 1000ML POUR (IV SOLUTION) ×2 IMPLANT

## 2020-02-12 NOTE — Anesthesia Procedure Notes (Addendum)
Anesthesia Regional Block: Ankle block   Pre-Anesthetic Checklist: ,, timeout performed, Correct Patient, Correct Site, Correct Laterality, Correct Procedure, Correct Position, site marked, Risks and benefits discussed, Surgical consent,  Pre-op evaluation,  At surgeon's request  Laterality: Right  Prep: chloraprep       Needles:  Injection technique: Single-shot  Needle Type: Other      Needle Gauge: 25     Additional Needles:   Narrative:  Start time: 02/12/2020 10:00 AM End time: 02/12/2020 10:10 AM Injection made incrementally with aspirations every 5 mL.  Performed by: Personally  Anesthesiologist: Roberts Gaudy, MD  Additional Notes: 30 cc 2% lidocaine plain injected easily

## 2020-02-12 NOTE — Transfer of Care (Signed)
Immediate Anesthesia Transfer of Care Note  Patient: [name]  Procedure(s) Performed: TRANSMETATARSAL AMPUTATION RIGHT (Right Toe)  Patient Location: PACU  Anesthesia Type:MAC and Regional  Level of Consciousness: awake and oriented  Airway & Oxygen Therapy: Patient Spontanous Breathing and Patient connected to face mask oxygen  Post-op Assessment: Report given to RN  Post vital signs: Reviewed and stable  Last Vitals:  Vitals Value Taken Time  BP 90/60 02/12/20 1239  Temp    Pulse 64 02/12/20 1240  Resp 22 02/12/20 1240  SpO2 95 % 02/12/20 1240  Vitals shown include unvalidated device data.  Last Pain:  Vitals:   02/12/20 0810  TempSrc: Oral  PainSc:       Patients Stated Pain Goal: 0 (58/72/76 1848)  Complications: No complications documented.

## 2020-02-12 NOTE — Anesthesia Preprocedure Evaluation (Signed)
Anesthesia Evaluation  Patient identified by MRN, date of birth, ID band Patient awake    Reviewed: Allergy & Precautions, NPO status , Patient's Chart, lab work & pertinent test results  Airway Mallampati: II  TM Distance: >3 FB Neck ROM: Full    Dental  (+) Teeth Intact, Dental Advisory Given   Pulmonary    breath sounds clear to auscultation       Cardiovascular  Rhythm:Regular Rate:Normal     Neuro/Psych    GI/Hepatic   Endo/Other  diabetes  Renal/GU      Musculoskeletal   Abdominal   Peds  Hematology   Anesthesia Other Findings   Reproductive/Obstetrics                             Anesthesia Physical Anesthesia Plan  ASA: III  Anesthesia Plan: MAC and Regional   Post-op Pain Management:    Induction: Intravenous  PONV Risk Score and Plan: Ondansetron and Propofol infusion  Airway Management Planned: Natural Airway and Simple Face Mask  Additional Equipment:   Intra-op Plan:   Post-operative Plan:   Informed Consent: I have reviewed the patients History and Physical, chart, labs and discussed the procedure including the risks, benefits and alternatives for the proposed anesthesia with the patient or authorized representative who has indicated his/her understanding and acceptance.     Dental advisory given  Plan Discussed with: CRNA and Anesthesiologist  Anesthesia Plan Comments: (Plan ankle block with MAC)        Anesthesia Quick Evaluation

## 2020-02-12 NOTE — Anesthesia Postprocedure Evaluation (Signed)
Anesthesia Post Note  Patient: [name]  Procedure(s) Performed: TRANSMETATARSAL AMPUTATION RIGHT (Right Toe)     Patient location during evaluation: PACU Anesthesia Type: Regional Level of consciousness: awake and alert Pain management: pain level controlled Vital Signs Assessment: post-procedure vital signs reviewed and stable Respiratory status: spontaneous breathing, nonlabored ventilation, respiratory function stable and patient connected to nasal cannula oxygen Cardiovascular status: stable and blood pressure returned to baseline Postop Assessment: no apparent nausea or vomiting Anesthetic complications: no   No complications documented.  Last Vitals:  Vitals:   02/12/20 1255 02/12/20 1309  BP: 97/61 (!) 102/53  Pulse: 63 63  Resp: 18 13  Temp:  36.7 C  SpO2: 97% 97%    Last Pain:  Vitals:   02/12/20 1309  TempSrc: Oral  PainSc: 0-No pain                 Zenia Guest COKER

## 2020-02-12 NOTE — Progress Notes (Addendum)
  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 Dressings present. Palpable Dp/ PT pulses bilaterally. 2+ femoral pulses. Lower extremities well perfused and warm Neurologic: alert and oriented  CBC    Component Value Date/Time   WBC 9.3 02/11/2020 1858   RBC 4.32 02/11/2020 1858   HGB 12.8 (L) 02/11/2020 1858   HCT 38.3 (L) 02/11/2020 1858   PLT 245 02/11/2020 1858   MCV 88.7 02/11/2020 1858   MCH 29.6 02/11/2020 1858   MCHC 33.4 02/11/2020 1858   RDW 12.2 02/11/2020 1858   LYMPHSABS 1,560 09/11/2019 1552   MONOABS 0.9 10/17/2017 1116   EOSABS 242 09/11/2019 1552   BASOSABS 47 09/11/2019 1552    BMET    Component Value Date/Time   NA 133 (L) 02/11/2020 1858   NA 137 08/12/2013 1347   K 4.5 02/11/2020 1858   CL 100 02/11/2020 1858   CO2 24 02/11/2020 1858   GLUCOSE 156 (H) 02/11/2020 1858   BUN 20 02/11/2020 1858   BUN 20 08/12/2013 1347   CREATININE 1.49 (H) 02/11/2020 1858   CREATININE 1.28 (H) 09/11/2019 1552   CALCIUM 9.1 02/11/2020 1858   GFRNONAA 50 (L) 02/11/2020 1858   GFRAA >60 03/18/2017 2229    INR    Component Value Date/Time   INR 1.6 (H) 02/11/2020 1858   INR 1.7 04/27/2010 0932     Intake/Output Summary (Last 24 hours) at 02/12/2020 0808 Last data filed at 02/12/2020 0522 Gross per 24 hour  Intake 800 ml  Output 675 ml  Net 125 ml     Assessment/Plan:  74 y.o. male with wet gangrene of right 1st-5th toes. Lower extremities well perfused and warm with palpable DP and PT pulses. On Ancef IV. He is NPO. He is scheduled to have right TMA today with Dr. Carlis Abbott  DVT prophylaxis: sq heparin  Karoline Caldwell, PA-C [facility] [phone] 02/12/2020 8:08 AM   I have seen and evaluated the patient. I agree with the PA note as documented above. Plan right TMA.  May need to leave TMA open with vac given infection.  Still high risk for limb loss as discussed with patient.    Marty Heck, MD [facility] [place] Office: [phone]

## 2020-02-12 NOTE — Plan of Care (Signed)
  Problem: Education: Goal: Knowledge of General Education information will improve Description Including pain rating scale, medication(s)/side effects and non-pharmacologic comfort measures Outcome: Progressing   

## 2020-02-12 NOTE — Op Note (Signed)
Date: February 12, 2020  Preoperative diagnosis: Gangrenous right toes 1 through 5  Postoperative diagnosis: Same  Procedure: Right transmetatarsal amputation  Surgeon: Dr. Marty Heck, MD  Assistant: Paulo Fruit, PA  Indications: Patient is a 74 year old male who was seen in the office yesterday by Dr. Stanford Breed with gangrenous changes and evidence of wet gangrene to the right 1 through 5 toes.  He presents today for planned TMA and attempt at limb salvage after risk benefits discussed.  An assistant was needed to expedite the case and for exposure.  Findings: Fishmouth incision made for right transmetatarsal amputation.  There was no purulent material encountered and all the tissue appeared viable with excellent blood flow that was pulsatile.  This was primarily closed after washed out.  Anesthesia: Regional block  Details: Patient was taken to the operating room after informed consent was obtained.  Placed on the operative table in supine position.  After anesthesia was induced the right foot was prepped draped usual sterile fashion.  Timeout was performed to identify patient, procedure and site.  Initially marked out a fishmouth incision using a nice long plantar flap and healthy tissue margin on the dorsum of the foot.  Ultimately incision was made with 15 blade scalpel and ultimately carried down dissection with Bovie cautery.  All the metatarsal heads were dissected out with Bovie cautery and then transected with oscillating saw.  All the vessels had pulsatile inflow were controlled with 3-0 silk suture ligature.  The flap was then irrigated and hemostasis was obtained with Bovie cautery 3-0 silk suture.  We then reapproximated the flap with 3-0 Vicryl.  The skin was then reapproximated with 2-0 nylons in a horizontal mattress.  He tolerated this without any complications.  Dry sterile dressings were applied.  Complication: None  Condition: Stable  Marty Heck,  MD [facility] [place] Office: [place]

## 2020-02-12 NOTE — H&P (Signed)
ASSESSMENT & PLAN:  74 y.o. male with wet gangrene of the right toes 1-5. 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 Needs admission to the hospital and IV antibiotics. Check XR of foot. Plan R TMA tomorrow. May leave open if purulence encountered.   CHIEF COMPLAINT:   Right foot ulceration  HISTORY:  HISTORY OF PRESENT ILLNESS: [name] is a 74 y.o. male referred to clinic for evaluation of gangrene of the right toes.  This started initially as a blistering rash over his first and second toes several weeks ago.  The ulceration has progressed rather quickly and now involves all of his toes.  There is serous drainage from the wounds.  I cannot express any purulence on my exam.  He is not tender about any of the toes.  He has no constitutional symptoms of systemic infection.  Past Medical History:  Diagnosis Date  . CALLUS, RIGHT FOOT 12/08/2009  . Cardiac arrest ([place])   . Chronic systolic heart failure ([place]) 11/03/2009  . DIABETES MELLITUS, TYPE II 12/05/2006  . 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 HYPERLIPIDEMIA 11/25/2006  . HYPOGONADISM, MALE 12/05/2006  . Implantable cardiac defibrillator MDT 09/22/2008   Change out feb 2011, 2015  . Ischemic cardiomyopathy 09/22/2008   DES CX and RCA  70% residual LAD  Done in [place]; EF 30%;; myoviewq 2006/06/01 no ischemia  . Kidney stones   . Long term (current) use of anticoagulants 04/25/2010  . Myocardial infarction [facility]) 2004-05-31   "I died and they brought me back"  . 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 NEPHROLITHIASIS, HX OF 02/25/2008  . Persistent atrial fibrillation ([place]) 11/25/2006   Inapp shock 06-01-10 AF VR >220 recurrent    Past Surgical History:  Procedure Laterality Date  . CARDIAC CATHETERIZATION     feburary May 31, 2012  . CARDIAC DEFIBRILLATOR PLACEMENT     Guidant Vitality [name]  . CARDIOVERSION N/A 04/25/2012   Procedure: CARDIOVERSION;  Surgeon: Darlin Coco, MD;  [facility] ENDOSCOPY;   Service: Cardiovascular;  Laterality: N/A;  . CARDIOVERSION N/A 05/21/2012   Procedure: CARDIOVERSION;  Surgeon: Deboraha Sprang, MD;  Location: [place];  Service: Cardiovascular;  Laterality: N/A;  . CATARACT EXTRACTION    . EYE SURGERY     cataracts  . IMPLANTABLE CARDIOVERTER DEFIBRILLATOR (ICD) GENERATOR CHANGE N/A 02/05/2014   Procedure: ICD GENERATOR CHANGE;  Surgeon: Deboraha Sprang, MD;  Location: [facility] CATH LAB;  Service: Cardiovascular;  Laterality: N/A;  . PTCA    . TONSILLECTOMY    . TONSILLECTOMY      Family History  Problem Relation Age of Onset  . Diabetes Maternal Grandmother   . Hyperlipidemia Maternal Grandmother   . Breast cancer Mother   . Cancer Neg Hx   . COPD Neg Hx   . Heart disease Neg Hx     Social History   Socioeconomic History  . Marital status: Divorced    Spouse name: Not on file  . Number of children: 1  . Years of education: 70  . Highest education level: Not on file  Occupational History  . 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 Smoking status: Never Smoker  . Smokeless tobacco: Never Used  Vaping Use  . Vaping Use: Never used  Substance and Sexual Activity  . Alcohol use: Yes    Alcohol/week: 0.0 standard drinks    Comment: summer   .  Drug use: No  . Sexual activity: Yes    Partners: Female  Other Topics Concern  . Not on file  Social History Narrative   HSG, College grad. Married - divorced. 1 daughter. owner/operator Research officer, trade union business and show production   Social Determinants of Radio broadcast assistant Strain: Not on file  Food Insecurity: Not on file  Transportation Needs: Not on file  Physical Activity: Not on file  Stress: Not on file  Social Connections: Not on file  Intimate Partner Violence: Not on file    Allergies  Allergen Reactions  . Salmon [Fish Allergy] Other (See Comments)    Only canned/ not fresh (probably  preservatives).   . Sulfa Antibiotics     Not sure has allergyu to this Only canned/ not fresh (probably preservatives).   . Metformin And Related Other (See Comments)    diarrhea    Current Facility-Administered Medications  Medication Dose Route Frequency Provider Last Rate Last Admin  . 0.9 %  sodium chloride infusion   Intravenous Continuous Angelia Mould, MD 50 mL/hr at 02/11/20 1853 New Bag at 02/11/20 1853  . [MAR Hold] alum & mag hydroxide-simeth (MAALOX/MYLANTA) 200-200-20 MG/5ML suspension 15-30 mL  15-30 mL Oral Q2H PRN Angelia Mould, MD      . Doug Sou Hold] chlorhexidine (PERIDEX) 0.12 % solution 15 mL  15 mL Mouth/Throat NOW Roberts Gaudy, MD      . Doug Sou Hold] dofetilide [facility]) capsule 500 mcg  500 mcg Oral BID Angelia Mould, MD   500 mcg at 02/11/20 2224  . fentaNYL (SUBLIMAZE) injection 25-50 mcg  25-50 mcg Intravenous Q5 min PRN Roberts Gaudy, MD      . Doug Sou Hold] guaiFENesin-dextromethorphan [facility] DM) 100-10 MG/5ML syrup 15 mL  15 mL Oral Q4H PRN Angelia Mould, MD      . Doug Sou Hold] heparin injection 5,000 Units  5,000 Units Subcutaneous Q8H Angelia Mould, MD   5,000 Units at 02/11/20 2224  . [MAR Hold] hydrALAZINE (APRESOLINE) injection 5 mg  5 mg Intravenous Q20 Min PRN Angelia Mould, MD      . Doug Sou Hold] insulin aspart (novoLOG) injection 0-15 Units  0-15 Units Subcutaneous TID WC Angelia Mould, MD   2 Units at 02/12/20 [phone]  . [MAR Hold] labetalol (NORMODYNE) injection 10 mg  10 mg Intravenous Q10 min PRN Angelia Mould, MD      . lactated ringers infusion   Intravenous Continuous Roberts Gaudy, MD 10 mL/hr at 02/12/20 1013 Restarted at 02/12/20 1121  . [MAR Hold] LORazepam (ATIVAN) tablet 0.5 mg  0.5 mg Oral QHS Angelia Mould, MD   0.5 mg at 02/11/20 2225  . [MAR Hold] magnesium oxide (MAG-OX) tablet 400 mg  400 mg Oral BID Angelia Mould, MD   400 mg at 02/11/20 2226  . [MAR  Hold] metoprolol succinate (TOPROL-XL) 24 hr tablet 200 mg  200 mg Oral QHS Angelia Mould, MD      . Doug Sou Hold] metoprolol tartrate (LOPRESSOR) injection 2-5 mg  2-5 mg Intravenous Q2H PRN Angelia Mould, MD      . Doug Sou Hold] mupirocin ointment (BACTROBAN) 2 % 1 application  1 application Nasal BID Angelia Mould, MD      . Doug Sou Hold] ondansetron [facility]) injection 4 mg  4 mg Intravenous Q6H PRN Angelia Mould, MD      . ondansetron [facility]) injection 4 mg  4 mg Intravenous Once PRN Roberts Gaudy, MD      .  oxyCODONE (Oxy IR/ROXICODONE) immediate release tablet 5 mg  5 mg Oral Once PRN Roberts Gaudy, MD       Or  . oxyCODONE (ROXICODONE) 5 MG/5ML solution 5 mg  5 mg Oral Once PRN Roberts Gaudy, MD      . Doug Sou Hold] oxyCODONE (Oxy IR/ROXICODONE) immediate release tablet 5-10 mg  5-10 mg Oral Q4H PRN Angelia Mould, MD      . Doug Sou Hold] pantoprazole (PROTONIX) EC tablet 40 mg  40 mg Oral Daily Angelia Mould, MD      . Doug Sou Hold] phenol (CHLORASEPTIC) mouth spray 1 spray  1 spray Mouth/Throat PRN Angelia Mould, MD      . Doug Sou Hold] piperacillin-tazobactam (ZOSYN) IVPB 3.375 g  3.375 g Intravenous Q6H Cherre Robins, MD      . Doug Sou Hold] potassium chloride SA (KLOR-CON) CR tablet 20-40 mEq  20-40 mEq Oral Once Angelia Mould, MD      . Doug Sou Hold] rosuvastatin (CRESTOR) tablet 20 mg  20 mg Oral QHS Cherre Robins, MD      . Doug Sou Hold] sacubitril-valsartan (ENTRESTO) 24-26 mg per tablet  1 tablet Oral BID Angelia Mould, MD   1 tablet at 02/11/20 2226  . [MAR Hold] zolpidem (AMBIEN) tablet 5 mg  5 mg Oral QHS PRN Angelia Mould, MD   5 mg at 02/11/20 2226    REVIEW OF SYSTEMS:  [X]  denotes positive finding, [ ]  denotes negative finding Cardiac  Comments:  Chest pain or chest pressure:    Shortness of breath upon exertion:    Short of breath when lying flat:    Irregular heart rhythm:        Vascular    Pain in  calf, thigh, or hip brought on by ambulation:    Pain in feet at night that wakes you up from your sleep:     Blood clot in your veins:    Leg swelling:         Pulmonary    Oxygen at home:    Productive cough:     Wheezing:         Neurologic    Sudden weakness in arms or legs:     Sudden numbness in arms or legs:     Sudden onset of difficulty speaking or slurred speech:    Temporary loss of vision in one eye:     Problems with dizziness:         Gastrointestinal    Blood in stool:     Vomited blood:         Genitourinary    Burning when urinating:     Blood in urine:        Psychiatric    Major depression:         Hematologic    Bleeding problems:    Problems with blood clotting too easily:        Skin    Rashes or ulcers:        Constitutional    Fever or chills:     PHYSICAL EXAM:   Vitals:   02/12/20 1030 02/12/20 1240 02/12/20 1255 02/12/20 1309  BP:  90/60 97/61 (!) 102/53  Pulse: 60 64 63 63  Resp: 16 (!) 22 18 13   Temp:  98.8 F (37.1 C)  98 F (36.7 C)  TempSrc:      SpO2: 100% 95% 97% 97%  Weight:      Height:  Constitutional: Well appearing in no distress. Appears well nourished.  Neurologic: Normal gait and station. CN intact. No weakness. No sensory loss. Psychiatric: Mood and affect symmetric and appropriate. Eyes: No icterus. No conjunctival pallor. Ears, nose, throat: mucous membranes moist. Midline trachea. No carotid bruit. Cardiac: regular rate and rhythm.  Respiratory: unlabored. Abdominal: soft, non-tender, non-distended. No palpable pulsatile abdominal mass. Peripheral vascular:  Dorsalis pedis pulse: L 2+ / R 2+  Posterior tibial pulse: L 2+ / R 2+ Extremity: No edema. No cyanosis. No pallor.  Skin: Wet gangrene of right forefoot. All toes involved. Lymphatic: No Stemmer's sign. No palpable lymphadenopathy.   DATA REVIEW:    Most recent CBC CBC Latest Ref Rng & Units 02/11/2020 09/11/2019 10/17/2017  WBC 4.0 - 10.5  K/uL 9.3 7.8 6.4  Hemoglobin 13.0 - 17.0 g/dL 12.8(L) 15.0 14.5  Hematocrit 39.0 - 52.0 % 38.3(L) 44.4 42.8  Platelets 150 - 400 K/uL 245 161 147.0(L)     Most recent CMP CMP Latest Ref Rng & Units 02/11/2020 09/11/2019 01/03/2019  Glucose 70 - 99 mg/dL 156(H) 144(H) 163(H)  BUN 8 - 23 mg/dL 20 22 23   Creatinine 0.61 - 1.24 mg/dL 1.49(H) 1.28(H) 1.16  Sodium 135 - 145 mmol/L 133(L) 136 134(L)  Potassium 3.5 - 5.1 mmol/L 4.5 4.6 4.4  Chloride 98 - 111 mmol/L 100 101 99  CO2 22 - 32 mmol/L 24 27 27   Calcium 8.9 - 10.3 mg/dL 9.1 9.4 9.6  Total Protein 6.5 - 8.1 g/dL 7.0 7.2 -  Total Bilirubin 0.3 - 1.2 mg/dL 0.6 0.5 -  Alkaline Phos 38 - 126 U/L 70 - -  AST 15 - 41 U/L 21 18 -  ALT 0 - 44 U/L 15 15 -    Renal function Estimated Creatinine Clearance: 46.3 mL/min (A) (by C-G formula based on SCr of 1.49 mg/dL (H)).  Hgb A1c MFr Bld (%)  Date Value  02/11/2020 6.9 (H)    LDL Cholesterol (Calc)  Date Value Ref Range Status  09/11/2019 49 mg/dL (calc) Final    Comment:    Reference range: <100 . Desirable range <100 mg/dL for primary prevention;   <70 mg/dL for patients with CHD or diabetic patients  with > or = 2 CHD risk factors. Marland Kitchen LDL-C is now calculated using the Martin-Hopkins  calculation, which is a validated novel method providing  better accuracy than the Friedewald equation in the  estimation of LDL-C.  Cresenciano Genre et al. Annamaria Helling. 9983;382(50): 2061-2068  (http://education.[facility].com/faq/FAQ164)    Direct LDL  Date Value Ref Range Status  10/17/2017 134.0 mg/dL Final    Comment:    Optimal:  <100 mg/dLNear or Above Optimal:  100-129 mg/dLBorderline High:  130-159 mg/dLHigh:  160-189 mg/dLVery High:  >190 mg/dL    LOWER EXTREMITY DOPPLER STUDY   Indications: Ulceration.    Performing Technologist: Ralene Cork RVT     Examination Guidelines: A complete evaluation includes at minimum, Doppler  waveform signals and systolic blood pressure reading  at the level of  bilateral  brachial, anterior tibial, and posterior tibial arteries, when vessel  segments  are accessible. Bilateral testing is considered an integral part of a  complete  examination. Photoelectric Plethysmograph (PPG) waveforms and toe systolic  pressure readings are included as required and additional duplex testing  as  needed. Limited examinations for reoccurring indications may be performed  as  noted.     ABI Findings:  +---------+------------------+-----+----------+--------+  Right  Rt Pressure (mmHg)IndexWaveform Comment   +---------+------------------+-----+----------+--------+  Brachial 103                      +---------+------------------+-----+----------+--------+  PTA   130        1.26 monophasic      +---------+------------------+-----+----------+--------+  DP    120        1.17 triphasic       +---------+------------------+-----+----------+--------+  Great Toe                 wounds   +---------+------------------+-----+----------+--------+   +---------+------------------+-----+--------+-------+  Left   Lt Pressure (mmHg)IndexWaveformComment  +---------+------------------+-----+--------+-------+  Brachial 101                     +---------+------------------+-----+--------+-------+  PTA   120        1.17 biphasic      +---------+------------------+-----+--------+-------+  DP    124        1.20 biphasic      +---------+------------------+-----+--------+-------+  Great Toe0         0.00           +---------+------------------+-----+--------+-------+   +-------+-----------+-----------+------------+------------+  ABI/TBIToday's ABIToday's TBIPrevious ABIPrevious TBI  +-------+-----------+-----------+------------+------------+  Right 1.26    NA                    +-------+-----------+-----------+------------+------------+  Left  1.2    0                   +-------+-----------+-----------+------------+------------+    No previous ABI.  Cannot rule out falsely elevated pedal pressures.    Summary:  Right: Resting right ankle-brachial index is within normal range. No  evidence of significant right lower extremity arterial disease.   Left: Resting left ankle-brachial index is within normal range. No  evidence of significant left lower extremity arterial disease. The left  toe-brachial index is abnormal.    Yevonne Aline. Stanford Breed, MD [facility] Phone Number: [phone] 02/12/2020 1:19 PM

## 2020-02-12 NOTE — Anesthesia Procedure Notes (Signed)
Procedure Name: MAC Date/Time: 02/12/2020 11:26 AM Performed by: Barrington Ellison, CRNA Pre-anesthesia Checklist: Patient identified, Emergency Drugs available, Suction available, Patient being monitored and Timeout performed Patient Re-evaluated:Patient Re-evaluated prior to induction Oxygen Delivery Method: Simple face mask

## 2020-02-13 ENCOUNTER — Encounter (HOSPITAL_COMMUNITY): Payer: Self-pay | Admitting: Vascular Surgery

## 2020-02-13 LAB — BASIC METABOLIC PANEL
Anion gap: 9 (ref 5–15)
BUN: 14 mg/dL (ref 8–23)
CO2: 27 mmol/L (ref 22–32)
Calcium: 9 mg/dL (ref 8.9–10.3)
Chloride: 99 mmol/L (ref 98–111)
Creatinine, Ser: 1.47 mg/dL — ABNORMAL HIGH (ref 0.61–1.24)
GFR, Estimated: 50 mL/min — ABNORMAL LOW (ref >60)
Glucose, Bld: 203 mg/dL — ABNORMAL HIGH (ref 70–99)
Potassium: 4.9 mmol/L (ref 3.5–5.1)
Sodium: 135 mmol/L (ref 135–145)

## 2020-02-13 LAB — GLUCOSE, CAPILLARY
Glucose-Capillary: 231 mg/dL — ABNORMAL HIGH (ref 70–99)
Glucose-Capillary: 171 mg/dL — ABNORMAL HIGH (ref 70–99)
Glucose-Capillary: 174 mg/dL — ABNORMAL HIGH (ref 70–99)
Glucose-Capillary: 250 mg/dL — ABNORMAL HIGH (ref 70–99)

## 2020-02-13 LAB — CBC
HCT: 35.1 % — ABNORMAL LOW (ref 39.0–52.0)
Hemoglobin: 11.7 g/dL — ABNORMAL LOW (ref 13.0–17.0)
MCH: 29.8 pg (ref 26.0–34.0)
MCHC: 33.3 g/dL (ref 30.0–36.0)
MCV: 89.5 fL (ref 80.0–100.0)
Platelets: 235 10*3/uL (ref 150–400)
RBC: 3.92 MIL/uL — ABNORMAL LOW (ref 4.22–5.81)
RDW: 12.6 % (ref 11.5–15.5)
WBC: 14.7 10*3/uL — ABNORMAL HIGH (ref 4.0–10.5)
nRBC: 0 % (ref 0.0–0.2)

## 2020-02-13 LAB — TROPONIN I (HIGH SENSITIVITY)
Troponin I (High Sensitivity): 13 ng/L (ref <18)
Troponin I (High Sensitivity): 12 ng/L (ref <18)

## 2020-02-13 MED ORDER — SODIUM CHLORIDE 0.9 % IV BOLUS
1000.0000 mL | Freq: Once | INTRAVENOUS | Status: AC
  Administered 2020-02-13: 1000 mL via INTRAVENOUS

## 2020-02-13 MED ORDER — RIVAROXABAN 20 MG PO TABS
20.0000 mg | ORAL_TABLET | Freq: Every day | ORAL | Status: DC
  Administered 2020-02-13 – 2020-02-14 (×2): 20 mg via ORAL
  Filled 2020-02-13 (×3): qty 1

## 2020-02-13 MED ORDER — PSEUDOEPHEDRINE HCL ER 120 MG PO TB12
120.0000 mg | ORAL_TABLET | Freq: Once | ORAL | Status: AC
  Administered 2020-02-13: 120 mg via ORAL
  Filled 2020-02-13: qty 1

## 2020-02-13 MED ORDER — SODIUM CHLORIDE 0.9 % IV BOLUS
500.0000 mL | Freq: Once | INTRAVENOUS | Status: AC
  Administered 2020-02-13: 500 mL via INTRAVENOUS

## 2020-02-13 MED ORDER — ADULT MULTIVITAMIN W/MINERALS CH
1.0000 | ORAL_TABLET | Freq: Every day | ORAL | Status: DC
  Administered 2020-02-13 – 2020-02-15 (×3): 1 via ORAL
  Filled 2020-02-13 (×3): qty 1

## 2020-02-13 MED ORDER — ENSURE ENLIVE PO LIQD
237.0000 mL | Freq: Two times a day (BID) | ORAL | Status: DC
  Administered 2020-02-13 – 2020-02-15 (×4): 237 mL via ORAL

## 2020-02-13 NOTE — Progress Notes (Addendum)
  Progress Note    02/13/2020 8:47 AM 1 Day Post-Op  Subjective:  No major complaints. States he tried to walk with walker and it was very difficult   Vitals:   02/13/20 0753 02/13/20 0804  BP:  (!) 91/53  Pulse: 60 (!) 59  Resp: 16 17  Temp: 97.9 F (36.6 C) 97.9 F (36.6 C)  SpO2: 98% 98%   Physical Exam: Cardiac: regular Lungs: non labored Incisions: Right TMA site clean, dry and intact. Looks good. Well approximated Extremities:  Bilateral lower extremities well perfused and warm. Palpable DP pulses bilaterally Neurologic: alert and oriented  CBC    Component Value Date/Time   WBC 14.7 (H) 02/13/2020 0056   RBC 3.92 (L) 02/13/2020 0056   HGB 11.7 (L) 02/13/2020 0056   HCT 35.1 (L) 02/13/2020 0056   PLT 235 02/13/2020 0056   MCV 89.5 02/13/2020 0056   MCH 29.8 02/13/2020 0056   MCHC 33.3 02/13/2020 0056   RDW 12.6 02/13/2020 0056   LYMPHSABS 1,560 09/11/2019 1552   MONOABS 0.9 10/17/2017 1116   EOSABS 242 09/11/2019 1552   BASOSABS 47 09/11/2019 1552    BMET    Component Value Date/Time   NA 135 02/13/2020 0056   NA 137 08/12/2013 1347   K 4.9 02/13/2020 0056   CL 99 02/13/2020 0056   CO2 27 02/13/2020 0056   GLUCOSE 203 (H) 02/13/2020 0056   BUN 14 02/13/2020 0056   BUN 20 08/12/2013 1347   CREATININE 1.47 (H) 02/13/2020 0056   CREATININE 1.28 (H) 09/11/2019 1552   CALCIUM 9.0 02/13/2020 0056   GFRNONAA 50 (L) 02/13/2020 0056   GFRAA >60 03/18/2017 2229    INR    Component Value Date/Time   INR 1.6 (H) 02/11/2020 1858   INR 1.7 04/27/2010 0932     Intake/Output Summary (Last 24 hours) at 02/13/2020 0847 Last data filed at 02/13/2020 6203 Gross per 24 hour  Intake 2216.29 ml  Output 302 ml  Net 1914.29 ml     Assessment/Plan:  74 y.o. male is s/p right TMA 1 Day Post-Op. Doing well post op. Hemodynamically stable. Pain well controlled. Has been up ambulating with some difficulty. PT/ OT pending evaluation today. Tolerating diet.  Voiding without difficulty. oob and mobilize today  DVT prophylaxis:sq heparin   Karoline Caldwell, [place] [facility] [phone] 02/13/2020 8:47 AM   VASCULAR STAFF ADDENDUM: I have independently interviewed and examined the patient. I agree with the above.  Looks great after R TMA by Dr. Carlis Abbott. Asymptomatic hypotension today. Monitor. Hold anti-hypertensives. PT/OT/OOB/ambulate.  Yevonne Aline. Stanford Breed, MD [facility] Phone Number: [phone] 02/13/2020 2:07 PM

## 2020-02-13 NOTE — Progress Notes (Signed)
Inpatient Rehabilitation Admissions Coordinator   I have received inpatient rehab consult. I await postop evals to assist with planning dispo.  Danne Baxter, RN, MSN Rehab Admissions Coordinator [phone] 02/13/2020 8:26 AM

## 2020-02-13 NOTE — Progress Notes (Signed)
  Progress Note    02/13/2020 3:30 PM 1 Day Post-Op  Subjective: Asked to come see patient due to continued hypotension. Patient appears  Comfortable. Just finished working with OT. Tolerated well. Not complaining of any pain. RN also states decreased UOP. Patient states he is having difficulty using catheter   Vitals:   02/13/20 1200 02/13/20 1245  BP: (!) 81/53 (!) 83/51  Pulse: 62 (!) 59  Resp: 13 20  Temp: 98.6 F (37 C)   SpO2: 98% 95%   Physical Exam: Cardiac:  regular Lungs: non labored Incisions:  Right TMA dressed Extremities: well perfused and warm Neurologic: alert and oriented  CBC    Component Value Date/Time   WBC 14.7 (H) 02/13/2020 0056   RBC 3.92 (L) 02/13/2020 0056   HGB 11.7 (L) 02/13/2020 0056   HCT 35.1 (L) 02/13/2020 0056   PLT 235 02/13/2020 0056   MCV 89.5 02/13/2020 0056   MCH 29.8 02/13/2020 0056   MCHC 33.3 02/13/2020 0056   RDW 12.6 02/13/2020 0056   LYMPHSABS 1,560 09/11/2019 1552   MONOABS 0.9 10/17/2017 1116   EOSABS 242 09/11/2019 1552   BASOSABS 47 09/11/2019 1552    BMET    Component Value Date/Time   NA 135 02/13/2020 0056   NA 137 08/12/2013 1347   K 4.9 02/13/2020 0056   CL 99 02/13/2020 0056   CO2 27 02/13/2020 0056   GLUCOSE 203 (H) 02/13/2020 0056   BUN 14 02/13/2020 0056   BUN 20 08/12/2013 1347   CREATININE 1.47 (H) 02/13/2020 0056   CREATININE 1.28 (H) 09/11/2019 1552   CALCIUM 9.0 02/13/2020 0056   GFRNONAA 50 (L) 02/13/2020 0056   GFRAA >60 03/18/2017 2229    INR    Component Value Date/Time   INR 1.6 (H) 02/11/2020 1858   INR 1.7 04/27/2010 0932     Intake/Output Summary (Last 24 hours) at 02/13/2020 1530 Last data filed at 02/13/2020 1735 Gross per 24 hour  Intake 1566.29 ml  Output 2 ml  Net 1564.29 ml     Assessment/Plan:  74 y.o. male is s/p right TMA 1 Day Post-Op. No active bleeding. Blood pressure soft. 1L given followed by 500 ml bolus with minimal change in blood pressure. Home BP  meds held and also holding IV/ PO opioid pain medication. Patient pain well controlled with tylenol. Tolerated working with OT. Is asymptomatic from hypotension. Will continue to monitor    Karoline Caldwell, PA-C [facility] [phone] 02/13/2020 3:30 PM

## 2020-02-13 NOTE — Progress Notes (Signed)
PT Cancellation Note  Patient Details Name: [name] MRN: [id number] DOB: 26-Oct-1945   Cancelled Treatment:    Reason Eval/Treat Not Completed: (P) Medical issues which prohibited therapy Pt BP low. RN request hold on PT Evaluation for now. PT will follow back as able this afternoon.   Calel Pisarski B. Migdalia Dk PT, DPT Acute Rehabilitation Services Pager [phone] Office [phone]   [place] 02/13/2020, 10:42 AM

## 2020-02-13 NOTE — Progress Notes (Signed)
Initial Nutrition Assessment  DOCUMENTATION CODES:   Not applicable  INTERVENTION:    Ensure Enlive po BID, each supplement provides 350 kcal and 20 grams of protein  MVI daily   NUTRITION DIAGNOSIS:   Increased nutrient needs related to post-op healing as evidenced by estimated needs.  GOAL:   Patient will meet greater than or equal to 90% of their needs  MONITOR:   PO intake,Weight trends,Labs,I & O's,Supplement acceptance  REASON FOR ASSESSMENT:   Malnutrition Screening Tool    ASSESSMENT:   Patient with PMH significant for CHF, DM with peripheral neuropathy, HLD, and afib. Presents this admission with gangrene right toes.   12/15- s/p R TMA  Patient endorses loss in appetite over the last month due to unknown reasons. States during this time he consumed two meals daily. Unable to elaborate on meal composition. States some weekends he will get together with friends to have PBRs (6-10 cans) but has not done that in awhile. Appetite this admission is progressing. Last meal completion charted as 100%. RD to provide supplementation to promote wound healing.   Pt reports a UBW of 200 lb and a recent dry wt loss of 10 lb. Records indicate pt's weight has fluctuated between 188-198 lb over the last year. Unable to determine dry wt loss from fluid fluctuation.   Drips: NS @ 50 ml/hr  Medications: colace, SS novolog Labs: CBG 130-238  NUTRITION - FOCUSED PHYSICAL EXAM:  Flowsheet Row Most Recent Value  Orbital Region No depletion  Upper Arm Region Mild depletion  Thoracic and Lumbar Region No depletion  Buccal Region No depletion  Temple Region No depletion  Clavicle Bone Region Mild depletion  Clavicle and Acromion Bone Region Mild depletion  Scapular Bone Region Unable to assess  Dorsal Hand No depletion  Patellar Region Moderate depletion  Anterior Thigh Region Moderate depletion  Posterior Calf Region Moderate depletion  Edema (RD Assessment) Mild  Hair  Reviewed  Eyes Reviewed  Mouth Reviewed  Skin Reviewed  Nails Reviewed     Diet Order:   Diet Order            Diet Carb Modified Fluid consistency: Thin; Room service appropriate? Yes  Diet effective now                 EDUCATION NEEDS:   Not appropriate for education at this time  Skin:  Skin Assessment: Reviewed RN Assessment  Last BM:  12/15  Height:   Ht Readings from Last 1 Encounters:  02/12/20 5\' 10"  (1.778 m)    Weight:   Wt Readings from Last 1 Encounters:  02/12/20 85.3 kg    BMI:  Body mass index is 26.98 kg/m.  Estimated Nutritional Needs:   Kcal:  2200-2400 kcal  Protein:  115-130 grams  Fluid:  >/= 2 L/day  Mariana Single RD, LDN Clinical Nutrition Pager listed in [place]

## 2020-02-13 NOTE — Progress Notes (Signed)
Pt BP dropped . PA notified and 1L NACL bolus started.. will continue to monitor.  BP now taken Q15  Albin Felling Brix Brearley       02/13/20 0950  Assess: MEWS Score  Temp 97.9 F (36.6 C)  BP (!) 77/49  Pulse Rate 62  ECG Heart Rate 61  Resp 19  Level of Consciousness Alert  SpO2 98 %  O2 Device Room Air  O2 Flow Rate (L/min) 0 L/min  Assess: MEWS Score  MEWS Temp 0  MEWS Systolic 2  MEWS Pulse 0  MEWS RR 0  MEWS LOC 0  MEWS Score 2  MEWS Score Color Yellow  Assess: if the MEWS score is Yellow or Red  Were vital signs taken at a resting state? Yes  Focused Assessment No change from prior assessment  Early Detection of Sepsis Score *See Row Information* Medium  MEWS guidelines implemented *See Row Information* Yes  Notify: Charge Nurse/RN  Name of Charge Nurse/RN Notified [place]  Date Charge Nurse/RN Notified 02/13/20  Time Charge Nurse/RN Notified 6553  Notify: Provider  Provider Name/Title Karoline Caldwell PA  Date Provider Notified 02/13/20  Time Provider Notified [phone]  Notification Type Page  Notification Reason Change in status  Response See new orders  Date of Provider Response 02/13/20  Time of Provider Response [phone]

## 2020-02-13 NOTE — Progress Notes (Signed)
Mobility Specialist - Progress Note   02/13/20 1125  Mobility  Activity Contraindicated/medical hold   Pt's BP currently 69/30. Will hold mobility for now.   Pricilla Handler Mobility Specialist Mobility Specialist Phone: [phone]

## 2020-02-13 NOTE — Evaluation (Signed)
Occupational Therapy Evaluation Patient Details Name: [name] MRN: [id number] DOB: 02-09-1946 Today's Date: 02/13/2020    History of Present Illness 73 y.o. male with wet gangrene of the right toes 1-5, is s/p right TMA 12/15.   Clinical Impression   Patient admitted with the above diagnosis and procedure.  PTA he was largely independent with self care, mobility, light home management, community mobility, meds, but his daughter did assist with bill payment.  He was having increasing difficulties over the last 2 weeks due to the gangrene mentioned above, family was providing increased assist, but not 24 hour care.  His deficits are listed below, currently he needs up to Min A with lower body ADL, setup for upper body ADL seated, and Min Guard for in room mobility at a RW level.      Follow Up Recommendations  Home health OT    Equipment Recommendations  3 in 1 bedside commode;Tub/shower seat    Recommendations for Other Services       Precautions / Restrictions Precautions Precautions: Fall Required Braces or Orthoses: Other Brace Other Brace: has a Lobbyist in his closet.  Spoke with C-PA and patient is WBAT to LLE. Restrictions Weight Bearing Restrictions: Yes LLE Weight Bearing: Weight bearing as tolerated      Mobility Bed Mobility Overal bed mobility: Needs Assistance Bed Mobility: Rolling;Supine to Sit;Sit to Supine Rolling: Modified independent (Device/Increase time)   Supine to sit: Supervision Sit to supine: Supervision        Transfers Overall transfer level: Needs assistance   Transfers: Sit to/from Stand;Stand Pivot Transfers Sit to Stand: Min guard Stand pivot transfers: Min guard       General transfer comment: Patient was able to walk from bed to bathroom and back.  RW and step to pattern.    Balance Overall balance assessment: Needs assistance Sitting-balance support: Single extremity supported Sitting balance-Leahy Scale: Good      Standing balance support: Bilateral upper extremity supported Standing balance-Leahy Scale: Poor Standing balance comment: needs RW for support                           ADL either performed or assessed with clinical judgement   ADL Overall ADL's : Needs assistance/impaired Eating/Feeding: Independent;Sitting   Grooming: Wash/dry hands;Wash/dry face;Oral care;Set up;Sitting   Upper Body Bathing: Set up;Sitting   Lower Body Bathing: Minimal assistance;Sit to/from stand   Upper Body Dressing : Set up;Sitting   Lower Body Dressing: Minimal assistance;Sit to/from stand   Toilet Transfer: Minimal assistance;RW;Ambulation   Toileting- Clothing Manipulation and Hygiene: Supervision/safety;Sitting/lateral lean       Functional mobility during ADLs: Minimal assistance;Rolling walker       Vision Patient Visual Report: No change from baseline       Perception     Praxis      Pertinent Vitals/Pain Pain Assessment: Faces Faces Pain Scale: Hurts little more Pain Location: L foot Pain Descriptors / Indicators: Sharp Pain Intervention(s): Monitored during session     Hand Dominance Right   Extremity/Trunk Assessment Upper Extremity Assessment Upper Extremity Assessment: Overall WFL for tasks assessed   Lower Extremity Assessment Lower Extremity Assessment: Defer to PT evaluation   Cervical / Trunk Assessment Cervical / Trunk Assessment: Normal   Communication Communication Communication: HOH   Cognition Arousal/Alertness: Awake/alert Behavior During Therapy: WFL for tasks assessed/performed Overall Cognitive Status: Within Functional Limits for tasks assessed  General Comments   BP lying: 72/54.  Sitting: 74/52  Standing: 85/54.  Patient was asymptomatic, and so he was mobilized.  Session cleared with nursing prior.      Exercises     Shoulder Instructions      Home Living Family/patient expects  to be discharged to:: Private residence Living Arrangements: Alone Available Help at Discharge: Family;Available PRN/intermittently Type of Home: House Home Access: Stairs to enter [employer] of Steps: 2 Entrance Stairs-Rails: None Home Layout: One level;Laundry or work area in [employer] Shower/Tub: Tub/shower unit;Walk-in shower   Bathroom Toilet: Standard Bathroom Accessibility: Yes How Accessible: Accessible via walker Home Equipment: Cane - single point          Prior Functioning/Environment Level of Independence: Independent        Comments: Daughter assists eith bills.        OT Problem List: Decreased activity tolerance;Impaired balance (sitting and/or standing);Decreased knowledge of use of DME or AE;Pain      OT Treatment/Interventions: Self-care/ADL training;Therapeutic exercise;DME and/or AE instruction;Balance training;Therapeutic activities    OT Goals(Current goals can be found in the care plan section) Acute Rehab OT Goals Patient Stated Goal: I just want to take care of myself OT Goal Formulation: With patient Time For Goal Achievement: 02/27/20 Potential to Achieve Goals: Good  OT Frequency: Min 2X/week   Barriers to D/C:    none noted       Co-evaluation              AM-PAC OT "6 Clicks" Daily Activity     Outcome Measure Help from another person eating meals?: None Help from another person taking care of personal grooming?: A Little Help from another person toileting, which includes using toliet, bedpan, or urinal?: A Little Help from another person bathing (including washing, rinsing, drying)?: A Little Help from another person to put on and taking off regular upper body clothing?: A Little Help from another person to put on and taking off regular lower body clothing?: A Little 6 Click Score: 19   End of Session Equipment Utilized During Treatment: Gait belt;Rolling walker;Other (comment) (surgical  shoe) Nurse Communication: Mobility status  Activity Tolerance: Patient tolerated treatment well Patient left: in bed;with call bell/phone within reach;with family/visitor present  OT Visit Diagnosis: Unsteadiness on feet (R26.81);Muscle weakness (generalized) (M62.81);Pain Pain - Right/Left: Left Pain - part of body: Ankle and joints of foot                Time: 6283-6629 OT Time Calculation (min): 37 min Charges:  OT General Charges $OT Visit: 1 Visit OT Evaluation $OT Eval Moderate Complexity: 1 Mod OT Treatments $Self Care/Home Management : 23-37 mins  02/13/2020  Rich, OTR/L  Acute Rehabilitation Services  Office:  [place] 02/13/2020, 3:18 PM

## 2020-02-13 NOTE — Plan of Care (Signed)
Plan of care reviewed.  Problem: Clinical Measurements: S/P right metatarsal amputation. Temp 101.2 F at 11.02 pm. Hemodynamically stable. Original dressing dry and clean,no bleeding. On IV Zosyn antibiotics q 8 hrs.  Goal: Will remain free from infection Outcome: Progressing   Problem: Clinical Measurements: On room air SPO2 94-99%, no respiratory distress noted. Goal: Respiratory complications will improve Outcome: Progressing   Problem: Clinical Measurements: Pt's hemodynamically stable, with permanent pacemaker, EKG : V-paced on monitor. BP within normal limits. Goal: Cardiovascular complication will be avoided Outcome: Progressing   Problem: Activity: able to ambulated to bathroom with walker and 2 assist. Goal: Risk for activity intolerance will decrease Outcome: Progressing   Problem: Pain Managment: alternated Dilaudid and Percocet Pain med given as needed. Pain tolerated well. Goal: General experience of comfort will improve Outcome: Progressing  Kennyth Lose, RN

## 2020-02-14 ENCOUNTER — Telehealth: Payer: Self-pay | Admitting: Internal Medicine

## 2020-02-14 DIAGNOSIS — H9193 Unspecified hearing loss, bilateral: Secondary | ICD-10-CM

## 2020-02-14 LAB — CBC
HCT: 27.8 % — ABNORMAL LOW (ref 39.0–52.0)
Hemoglobin: 9.4 g/dL — ABNORMAL LOW (ref 13.0–17.0)
MCH: 30.3 pg (ref 26.0–34.0)
MCHC: 33.8 g/dL (ref 30.0–36.0)
MCV: 89.7 fL (ref 80.0–100.0)
Platelets: 179 10*3/uL (ref 150–400)
RBC: 3.1 MIL/uL — ABNORMAL LOW (ref 4.22–5.81)
RDW: 12.8 % (ref 11.5–15.5)
WBC: 10.3 10*3/uL (ref 4.0–10.5)
nRBC: 0 % (ref 0.0–0.2)

## 2020-02-14 LAB — GLUCOSE, CAPILLARY
Glucose-Capillary: 185 mg/dL — ABNORMAL HIGH (ref 70–99)
Glucose-Capillary: 263 mg/dL — ABNORMAL HIGH (ref 70–99)
Glucose-Capillary: 209 mg/dL — ABNORMAL HIGH (ref 70–99)
Glucose-Capillary: 153 mg/dL — ABNORMAL HIGH (ref 70–99)

## 2020-02-14 LAB — BASIC METABOLIC PANEL
Anion gap: 7 (ref 5–15)
BUN: 17 mg/dL (ref 8–23)
CO2: 25 mmol/L (ref 22–32)
Calcium: 8.3 mg/dL — ABNORMAL LOW (ref 8.9–10.3)
Chloride: 101 mmol/L (ref 98–111)
Creatinine, Ser: 1.36 mg/dL — ABNORMAL HIGH (ref 0.61–1.24)
GFR, Estimated: 55 mL/min — ABNORMAL LOW (ref >60)
Glucose, Bld: 221 mg/dL — ABNORMAL HIGH (ref 70–99)
Potassium: 4.2 mmol/L (ref 3.5–5.1)
Sodium: 133 mmol/L — ABNORMAL LOW (ref 135–145)

## 2020-02-14 NOTE — Evaluation (Signed)
Physical Therapy Evaluation Patient Details Name: [name] MRN: [id number] DOB: 1945/08/08 Today's Date: 02/14/2020   History of Present Illness  74 y.o. male with wet gangrene of the right toes 1-5, is s/p right TMA 12/15.PMH of A-fib and HTN  Clinical Impression  PTA pt living alone in single story home with 2 steps to enter. Pt reports he was independent. Pt is limited in safe mobility by asymptomatic orthostatic hypotension and L foot pain contributing to decreased balance, strength and endurance. Pt is supervision for bed mobility, and min A for transfers and ambulation. During ambulation pt surgical site began bleeding. RN notified. Given pt's need for assistance with mobility, PT recommending 24 hour assist, which daughter agrees to as well as HHPT. PT will follow back tomorrow for stair training prior to d/c.   Orthostatic BPs                                                        BP                      HR(bpm) Supine 92/53 71  Sitting 85/49 81  Sitting after 3 min 84/53 53  Standing 99/46 67  After ambulation  99/63 78       Follow Up Recommendations Home health PT;Supervision/Assistance - 24 hour    Equipment Recommendations  Rolling walker with 5" wheels;3in1 (PT)       Precautions / Restrictions Precautions Precautions: Fall Required Braces or Orthoses: Other Brace Other Brace: has a Lobbyist in his closet.  Spoke with C-PA and patient is WBAT to LLE. Restrictions Weight Bearing Restrictions: Yes LLE Weight Bearing: Weight bearing as tolerated      Mobility  Bed Mobility Overal bed mobility: Needs Assistance Bed Mobility: Rolling;Supine to Sit;Sit to Supine Rolling: Modified independent (Device/Increase time)   Supine to sit: Supervision     General bed mobility comments: increased time and effort to come to EoB    Transfers Overall transfer level: Needs assistance   Transfers: Sit to/from Stand;Stand Pivot Transfers Sit to Stand: Min  guard;Min assist         General transfer comment: min A for power up and steadying in RW from lower bed surface, min guard for power up from [facility] over toilet  Ambulation/Gait Ambulation/Gait assistance: Min assist Gait Distance (Feet): 60 Feet Assistive device: Rolling walker (2 wheeled) Gait Pattern/deviations: Step-to pattern;Decreased stance time - left;Decreased step length - right;Decreased weight shift to left;Shuffle;Trunk flexed Gait velocity: slowed Gait velocity interpretation: <1.8 ft/sec, indicate of risk for recurrent falls General Gait Details: light min A progressing to heavy min A due to increased L foot pain and knee buckling with last 20 feet ambulation, vc for increased UE support on RW to advance R LE, cuing for upright posture, mildly unsteady, no overt LoB      Balance Overall balance assessment: Needs assistance Sitting-balance support: Single extremity supported Sitting balance-Leahy Scale: Good     Standing balance support: Bilateral upper extremity supported Standing balance-Leahy Scale: Poor Standing balance comment: needs UE for support                             Pertinent Vitals/Pain Pain Assessment: 0-10 Pain Score: 7  Pain Location:  L foot Pain Descriptors / Indicators: Sharp;Throbbing;Tightness Pain Intervention(s): Limited activity within patient's tolerance;Monitored during session;Repositioned    Home Living Family/patient expects to be discharged to:: Private residence Living Arrangements: Alone Available Help at Discharge: Family;Available PRN/intermittently Type of Home: House Home Access: Stairs to enter Entrance Stairs-Rails: None Entrance Stairs-Number of Steps: 2 Home Layout: One level;Laundry or work area in [place]: Kasandra Knudsen - single point      Prior Function Level of Independence: Independent         Comments: Daughter assists [place] bills.     Hand Dominance   Dominant Hand: Right     Extremity/Trunk Assessment   Upper Extremity Assessment Upper Extremity Assessment: Defer to OT evaluation    Lower Extremity Assessment Lower Extremity Assessment: LLE deficits/detail;RLE deficits/detail RLE Deficits / Details: ROM WFL strength grossly 4/5 LLE Deficits / Details: s/p L transmetatarsal amputation, foot and ankle with increased redness and edema, knee and hip ROM WFL, strength grossly 3+/5 likely due to pain LLE Sensation: decreased light touch    Cervical / Trunk Assessment Cervical / Trunk Assessment: Normal  Communication   Communication: HOH  Cognition Arousal/Alertness: Awake/alert Behavior During Therapy: WFL for tasks assessed/performed Overall Cognitive Status: Within Functional Limits for tasks assessed                                        General Comments General comments (skin integrity, edema, etc.): Pt daughter present and very involved. BP continues to be low especially diastolic, pt assymptomatic, in supine surgical site clean, and dry with stitches intact. placed sock to protect in Darco shoe, pt with moderate amount of bleeding with ambulation, with elevation and gauze, bleeding quickly stopped, RN notified        Assessment/Plan    PT Assessment Patient needs continued PT services  PT Problem List Decreased strength;Decreased range of motion;Decreased activity tolerance;Decreased mobility;Decreased knowledge of use of DME;Decreased safety awareness;Pain;Decreased skin integrity       PT Treatment Interventions DME instruction;Gait training;Stair training;Functional mobility training;Therapeutic activities;Therapeutic exercise;Balance training;Cognitive remediation;Patient/family education    PT Goals (Current goals can be found in the Care Plan section)  Acute Rehab PT Goals Patient Stated Goal: I just want to take care of myself PT Goal Formulation: With patient/family Time For Goal Achievement: 02/28/20 Potential to  Achieve Goals: Good    Frequency Min 3X/week    AM-PAC PT "6 Clicks" Mobility  Outcome Measure Help needed turning from your back to your side while in a flat bed without using bedrails?: None Help needed moving from lying on your back to sitting on the side of a flat bed without using bedrails?: None Help needed moving to and from a bed to a chair (including a wheelchair)?: A Little Help needed standing up from a chair using your arms (e.g., wheelchair or bedside chair)?: A Little Help needed to walk in hospital room?: A Little Help needed climbing 3-5 steps with a railing? : A Lot 6 Click Score: 19    End of Session Equipment Utilized During Treatment: Gait belt Activity Tolerance: Patient tolerated treatment well Patient left: in chair;with call bell/phone within reach;with chair alarm set;with family/visitor present Nurse Communication: Mobility status;Other (comment);Patient requests pain meds (surgical site bleeding,) PT Visit Diagnosis: Unsteadiness on feet (R26.81);Other abnormalities of gait and mobility (R26.89);Muscle weakness (generalized) (M62.81);Difficulty in walking, not elsewhere classified (R26.2);Pain Pain - Right/Left: Left Pain -  part of body: Ankle and joints of foot    Time: 8185-9093 PT Time Calculation (min) (ACUTE ONLY): 62 min   Charges:   PT Evaluation $PT Eval Moderate Complexity: 1 Mod PT Treatments $Gait Training: 8-22 mins $Therapeutic Activity: 23-37 mins        Nataly Pacifico B. Migdalia Dk PT, DPT Acute Rehabilitation Services Pager [phone] Office [phone]   [place] 02/14/2020, 12:20 PM

## 2020-02-14 NOTE — Progress Notes (Addendum)
[facility] [place]  Subjective  - Walking is difficult.   Objective 99/63 85 98.6 F (37 C) (Oral) 18 95%  Intake/Output Summary (Last 24 hours) at 02/14/2020 0818 Last data filed at 02/14/2020 0165 Gross per 24 hour  Intake 1401.77 ml  Output 1300 ml  Net 101.77 ml    Right TMA incision healing well Palpable DP Hypotension systolic 80"I, heart RRR Lungs non labored breathing  Assessment/Planning: POD # 2  74 y.o. male is s/p right TMA  Holding antihypertensives. Encouraged mobility Pending D/C mobility and hypotension    [name] 02/14/2020 8:18 AM --  Laboratory Lab Results: Recent Labs    02/13/20 0056 02/14/20 0114  WBC 14.7* 10.3  HGB 11.7* 9.4*  HCT 35.1* 27.8*  PLT 235 179   BMET Recent Labs    02/13/20 0056 02/14/20 0114  NA 135 133*  K 4.9 4.2  CL 99 101  CO2 27 25  GLUCOSE 203* 221*  BUN 14 17  CREATININE 1.47* 1.36*  CALCIUM 9.0 8.3*    COAG Lab Results  Component Value Date   INR 1.6 (H) 02/11/2020   INR 1.64 03/18/2017   INR 2.15 12/24/2015   PROTIME 19.9 08/12/2008   No results found for: PTT  VASCULAR STAFF ADDENDUM: I have independently interviewed and examined the patient. I agree with the above.  BP better. Suspect his HTN is overtreated. Resume beta blocker as soon as safe.  PT / OT / OOB / Ambulate with Darco shoe. Home once pain controlled. Needs f/u with PCP to address antiHTNs  Yevonne Aline. Stanford Breed, MD [facility] Phone Number: [phone] 02/14/2020 8:39 AM

## 2020-02-14 NOTE — Progress Notes (Signed)
We held his Metoprolol and Entresto tonight due to BP 81/51 -88/ 52 mmHg. Pt's  sleeping comfortably and asymptomatically. Dr.Hawken made aware. No new order received, satisfied if SBP > 80 mmHg.  Continue to monitor.   Eivin Mascio [id number],JL

## 2020-02-14 NOTE — Progress Notes (Signed)
Inpatient Rehabilitation Admissions Coordinator  Postoperative therapy is recommending Home with [facility] when medically ready for d/c. Not in need of an inpt rehab admit at this time.  Danne Baxter, RN, MSN Rehab Admissions Coordinator [phone] 02/14/2020 2:08 PM

## 2020-02-14 NOTE — TOC Initial Note (Signed)
Transition of Care [facility]) - Initial/Assessment Note    Patient Details  Name: [name] MRN: [id number] Date of Birth: 1945/05/26  Transition of Care [facility]) CM/SW Contact:    Vinie Sill, [place] Phone Number: 02/14/2020, 5:19 PM  Clinical Narrative:                  CSW visit with patient at bedside. CSW introduced self and explained role. CSW explained his daughter inquired about shot term rehab at [facility]. Patient states he lives alone and his doing better here in the hospital(walking with walker,etc..) but he wants to make sure to do what is best for him. He states he is agreeable to SNF. CSW explained the SNF process. Patient requested to call his daughter,Kate.  CSW spoke with Anda Kraft via phone. Anda Kraft expressed concerns about patient being home alone and his need for 24/7 supervision. She states she is discussing with family to see arrangements can be made. Anda Kraft inquired about SNF placement and if that can be an option. CSW explained the SNF process and informed he would have to be approved by his insurance. She was agreeable to start the SNF process, just incase arrangements can not be made. Patient states he is vaccinated. No other questions or concerns noted at this time.  CSW will provide bed offer once available.  CSW will continue to follow and assist with discharge planning.   Thurmond Butts, MSW, LCSW Clinical Social Worker   Expected Discharge Plan: Skilled Nursing Facility Barriers to Discharge: Continued Medical Work up,Insurance Authorization,SNF Pending bed offer   Patient Goals and CMS Choice        Expected Discharge Plan and Services Expected Discharge Plan: [place] In-house Referral: Clinical Social Work     Living arrangements for the past 2 months: Single Family Home                                      Prior Living Arrangements/Services Living arrangements for the past 2 months: Single Family Home Lives with::  Self Patient language and need for interpreter reviewed:: No        Need for Family Participation in Patient Care: Yes (Comment) Care giver support system in place?: Yes (comment)   Criminal Activity/Legal Involvement Pertinent to Current Situation/Hospitalization: No - Comment as needed  Activities of Daily Living Home Assistive Devices/Equipment: CBG Meter ADL Screening (condition at time of admission) Patient's cognitive ability adequate to safely complete daily activities?: Yes Is the patient deaf or have difficulty hearing?: No Does the patient have difficulty seeing, even when wearing glasses/contacts?: No Does the patient have difficulty concentrating, remembering, or making decisions?: No Patient able to express need for assistance with ADLs?: Yes Does the patient have difficulty dressing or bathing?: No Independently performs ADLs?: Yes (appropriate for developmental age) Does the patient have difficulty walking or climbing stairs?: No Weakness of Legs: Right Weakness of Arms/Hands: None  Permission Sought/Granted Permission sought to share information with : Family Supports Permission granted to share information with : Yes, Verbal Permission Granted  Share Information with NAME: Jd Mccaster  Permission granted to share info w AGENCY: SNFs  Permission granted to share info w Relationship: daughter  Permission granted to share info w Contact Information: [phone]  Emotional Assessment Appearance:: Appears stated age Attitude/Demeanor/Rapport: Engaged Affect (typically observed): Accepting,Pleasant Orientation: : Oriented to Self,Oriented to Place,Oriented to  Time,Oriented to Situation Alcohol /  Substance Use: Not Applicable Psych Involvement: No (comment)  Admission diagnosis:  PVD (peripheral vascular disease) ([place]) [I73.9] Patient Active Problem List   Diagnosis Date Noted  . Atherosclerotic peripheral vascular disease with gangrene ([place]) 02/11/2020  . PVD  (peripheral vascular disease) ([place]) 02/11/2020  . Drug-induced gynecomastia 03/26/2019  . Painful breasts 03/26/2019  . Anxiety 03/22/2017  . Back pain 10/11/2016  . Acute bronchitis 12/05/2014  . Preventative health care 02/04/2014  . Allergic rhinitis 02/04/2014  . Insomnia 02/04/2014  . Status post ligation of left atrial appendage 12/20/2012  . HTN (hypertension) 12/18/2012  . Ischemic cardiomyopathy 12/18/2012  . Syncope and collapse 05/15/2012  . Nephrolithiasis 04/19/2012  . Depression/anxiety 03/09/2012  . Ventricular fibrillation ([place]) 03/09/2012  . Left facial numbness 12/23/2010  . Long term current use of anticoagulant therapy 04/25/2010  . 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 CALLUS, RIGHT FOOT 12/08/2009  . Type II diabetes mellitus with neurological manifestations ([place]) 12/08/2009  . SYSTOLIC HEART FAILURE, CHRONIC 11/03/2009  . CATARACT EXTRACTION, HX OF 09/22/2008  . Implantable cardiac defibrillator MDT 09/22/2008  . 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 Diabetes ([place]) 12/05/2006  . HYPOGONADISM, MALE 12/05/2006  . Hyperlipidemia 11/25/2006  . ISCHEMIC CARDIOMYOPATHY 11/25/2006  . Atrial fibrillation ([place]) 11/25/2006  . CAD (coronary artery disease) 11/03/2004   PCP:  Biagio Borg, MD Pharmacy:   [facility] [place], [place] - [address] [place] [address] Phone: [phone] Fax: [phone]     Social Determinants of Health (SDOH) Interventions    Readmission Risk Interventions No flowsheet data found.

## 2020-02-14 NOTE — Progress Notes (Addendum)
Mobility Specialist - Progress Note   02/14/20 1431  Mobility  Activity Ambulated in room  Level of Assistance Standby assist, set-up cues, supervision of patient - no hands on  Assistive Device Front wheel walker  Distance Ambulated (ft) 20 ft  Mobility Response Tolerated well  Mobility performed by Mobility specialist  $Mobility charge 1 Mobility   Pt initially refusing ambulation, but eventually agreed to ambulate in room. Asx throughout ambulation. VSS. No visible bleeding. Pt in bed after walk.   Pricilla Handler Mobility Specialist Mobility Specialist Phone: [phone]

## 2020-02-14 NOTE — Telephone Encounter (Signed)
Ok this is done 

## 2020-02-14 NOTE — Telephone Encounter (Signed)
   Daughter calling to request referral to [facility], appointment for 12/28 for hearing eval

## 2020-02-14 NOTE — Telephone Encounter (Signed)
Sent to Dr. John. 

## 2020-02-14 NOTE — Progress Notes (Signed)
Inpatient Diabetes Program Recommendations  AACE/ADA: New Consensus Statement on Inpatient Glycemic Control (2015)  Target Ranges:  Prepandial:   less than 140 mg/dL      Peak postprandial:   less than 180 mg/dL (1-2 hours)      Critically ill patients:  140 - 180 mg/dL   Results for [name], [name] (MRN [id number]) as of 02/14/2020 12:15  Ref. Range 02/14/2020 05:32 02/14/2020 12:00  Glucose-Capillary Latest Ref Range: 70 - 99 mg/dL 185 (H)  3 units NOVOLOG  263 (H)     Admit Wet gangrene of the right toes 1-5  History: DM   Home DM Meds: Lantus 15 units Daily        Admelog 10 units TID       Victoza 1.8 mg Daily   Current: Novolog 0-15 units TID    Underwent Transmetatarsal Amp 12/15    MD- Please consider:  1. Start Lantus 8 units QHS (50% total home dose)   2. Start Novolog Meal Coverage: Novolog 5 units TID with meals (50% home dose of insulin with meals)     --Will follow patient during hospitalization--  Wyn Quaker RN, MSN, CDE Diabetes Coordinator Inpatient Glycemic Control Team Team Pager: [phone] (8a-5p)

## 2020-02-15 LAB — GLUCOSE, CAPILLARY
Glucose-Capillary: 181 mg/dL — ABNORMAL HIGH (ref 70–99)
Glucose-Capillary: 262 mg/dL — ABNORMAL HIGH (ref 70–99)

## 2020-02-15 MED ORDER — AMOXICILLIN-POT CLAVULANATE 875-125 MG PO TABS: 1.0000 | ORAL_TABLET | Freq: Two times a day (BID) | ORAL | 0 refills | Status: DC

## 2020-02-15 MED ORDER — OXYCODONE-ACETAMINOPHEN 5-325 MG PO TABS: 1.0000 | ORAL_TABLET | ORAL | 0 refills | Status: AC | PRN

## 2020-02-15 NOTE — Discharge Summary (Signed)
Discharge Summary  Patient ID: [name] [id number] 74 y.o. 08-04-1945  Admit date: 02/11/2020  Discharge date and time: 02/15/2020  Admitting Physician: Dr. Jamelle Haring  Discharge Physician: Dr. Monica Martinez   Admission Diagnoses: PVD (peripheral vascular disease) [facility]) [I73.9]  Discharge Diagnoses: Peripheral artery disease Non healing diabetic foot wound with gangrene  Admission Condition: fair  Discharged Condition: good  Indication for Admission: Gangrene of right 1st- 5th toes  Hospital Course: [name] is a 74 year old male who was seen in clinic by Dr. Stanford Breed on 02/11/20 for right foot gangrene. He was admitted to the hospital for IV antibiotics with planned surgical intervention on this right foot the following day. He subsequently underwent a right transmetatarsal amputation on 02/12/20 by Dr. Carlis Abbott. There was no purulent material encountered in the OR and all the tissue appeared viable with excellent blood flow so the amputation site was closed primarily. The patient tolerated the procedure well without any complications and was transferred to the PACU in stable condition.   POD#1 patient did well over night. Right TMA site well appearing. Right lower extremity well perfused. Pain well controlled. Ambulated with some assistance. Some hypotension. Antihypertensive medications held and 1500 L of NS was given. Patient asymptomatic from low blood pressure. Due to hypotension PT did not evaluate. However was able to work with mobility specialist and OT and tolerated well. Recommendation for home health OT was made. Continued to have low blood pressure throughout the day.  POD#2 right TMA intact and healing well. Right leg remained well perfused with palpable pulses. Pain well controlled. Encouraged oob and mobilization. Blood pressure remained soft. PT was able to evaluate and recommendation was made for home health PT as well. Patient was not felt to need  inpatient rehab. Social work met with patient and his daughter to discuss discharge planning with possible consideration for SNF vs home with home health services  POD#3 patient doing well post op. Right TMA well approximated and healing well. Patients pain improved and mobility improved with therapies. Patient and daughter decided against SNF and were comfortable with patient discharging home with home health PT/ OT and RN for wound care. Home DME needs were met. He will continue to ambulate as tolerated with Darco shoe. Patient otherwise remained stable for discharge. PDMP was reviewed and post operative pain medication was sent to patients requested pharmacy. Additionally he was given a prescription for oral antibiotics- Augmentin for 5 day course. He was instructed to continue to hold antihypertensive medications and follow up with PCP to reassess need to be on current regimen. Patient has follow up in 3 weeks for suture removal with Dr. Stanford Breed.  Consults: None  Treatments: IV hydration, therapies: PT and OT and surgery: right transmetatarsal amputation   Disposition: Discharge disposition: 01-Home or Self Care       Patient Instructions:  Allergies as of 02/15/2020      Reactions   Salmon [fish Allergy] Other (See Comments)   Only canned/ not fresh (probably preservatives).    Sulfa Antibiotics    Not sure has allergyu to this Only canned/ not fresh (probably preservatives).    Metformin And Related Other (See Comments)   diarrhea      Medication List    STOP taking these medications   carvedilol 12.5 MG tablet Commonly known as: COREG   furosemide 20 MG tablet Commonly known as: LASIX   ranolazine 500 MG 12 hr tablet Commonly known as: RANEXA   sacubitril-valsartan  24-26 MG Commonly known as: ENTRESTO   Vitamin D (Ergocalciferol) 1.25 MG (50000 UNIT) Caps capsule Commonly known as: DRISDOL     TAKE these medications   amoxicillin-clavulanate 875-125 MG  tablet Commonly known as: Augmentin Take 1 tablet by mouth 2 (two) times daily for 5 days.   aspirin EC 81 MG tablet Take 81 mg by mouth daily. Swallow whole.   citalopram 10 MG tablet Commonly known as: CELEXA TAKE 1 TABLET BY MOUTH EVERY DAY   dofetilide 500 MCG capsule Commonly known as: TIKOSYN TAKE 1 CAPSULE BY MOUTH 2 TIMES DAILY What changed: See the new instructions.   eplerenone 25 MG tablet Commonly known as: INSPRA Take 25 mg by mouth daily.   fluticasone 50 MCG/ACT nasal spray Commonly known as: FLONASE USE 2 SPRAYS IN EACH NOSTRIL EVERY DAY   FreeStyle Libre 14 Day Reader Kerrin Mo USE AS DIRECTED FOR 1 DOSE   FreeStyle Libre 14 Day Sensor Misc APPLY sensor EVERY 14 DAYS   FREESTYLE PRECISION NEO TEST VI by In Vitro route.   glucose blood test strip Commonly known as: ONE TOUCH ULTRA TEST 1 each by Other route 2 (two) times daily.   insulin lispro 100 UNIT/ML injection Commonly known as: Admelog Inject 0.1 mLs (10 Units total) into the skin 3 (three) times daily before meals.   Lancets Misc Use as directed up to 4 times per day   Lantus SoloStar 100 UNIT/ML Solostar Pen Generic drug: insulin glargine Inject 14 Units into the skin daily. What changed: how much to take   liraglutide 18 MG/3ML Sopn Commonly known as: VICTOZA Inject 1.8 mg into the skin every evening.   loratadine 10 MG tablet Commonly known as: CLARITIN Take 10 mg by mouth daily.   LORazepam 1 MG tablet Commonly known as: ATIVAN TAKE 1/2 TABLET BY MOUTH EVERY DAY AS NEEDED FOR ANXIETY What changed: See the new instructions.   MAG-OX 400 PO Take 400 mg by mouth 2 (two) times daily.   metoprolol 200 MG 24 hr tablet Commonly known as: TOPROL-XL Take 200 mg by mouth daily.   MULTIVITAMIN PO Take 1 tablet by mouth daily.   Nitrostat 0.4 MG SL tablet Generic drug: nitroGLYCERIN Place 1 tablet (0.4 mg total) under the tongue every 5 (five) minutes as needed for chest pain.    oxyCODONE-acetaminophen 5-325 MG tablet Commonly known as: Percocet Take 1 tablet by mouth every 4 (four) hours as needed for severe pain.   rosuvastatin 20 MG tablet Commonly known as: CRESTOR TAKE 1 TABLET BY MOUTH EVERY DAY What changed: when to take this   UltiCare Micro Pen Needles 32G X 4 MM Misc Generic drug: Insulin Pen Needle USE DAILY AS DIRECTED   Xarelto 20 MG Tabs tablet Generic drug: rivaroxaban TAKE 1 TABLET BY MOUTH EVERY DAY What changed:   how much to take  when to take this   zolpidem 5 MG tablet Commonly known as: AMBIEN TAKE 1 OR 2 TABLETS BY MOUTH NIGHTLY AT BEDTIME AS NEEDED What changed:   how much to take  how to take this  when to take this  reasons to take this  additional instructions      Activity: activity as tolerated and no driving while on analgesics Diet: diabetic diet Wound Care: keep wound clean and dry  Follow-up with Dr. Stanford Breed in 3 weeks.  SignedKaroline Caldwell, PA-C 02/16/2020 2:47 PM [facility] Office: [phone]

## 2020-02-15 NOTE — Progress Notes (Addendum)
Pt waiting for rolling walker for 4 hrs. Pt would like to go home without rolling walker and purchase the rolling walker by himself. D/c tele and iv. Went over AVS with pt and daugter.   Lavenia Atlas, RN

## 2020-02-15 NOTE — Progress Notes (Signed)
Physical Therapy Treatment Patient Details Name: [name] MRN: [id number] DOB: 1945-07-07 Today's Date: 02/15/2020    History of Present Illness 74 y.o. male with wet gangrene of the right toes 1-5, is s/p right TMA 12/15.PMH of A-fib and HTN    PT Comments    Patient did well with stair training today. He was able to go up and down steps with minimal assist. He has no railigs at home. Therapy reviewed safe guarding with patient and daughter. He will have railings built soon. He did well with transfers and ambulating. Patient would like to get his walker prior to D/C so he can walk to his door. Case management notified.   Follow Up Recommendations  Home health PT;Supervision/Assistance - 24 hour     Equipment Recommendations  Rolling walker with 5" wheels;3in1 (PT)    Recommendations for Other Services       Precautions / Restrictions Precautions Precautions: Fall Required Braces or Orthoses: Other Brace Other Brace: has a Lobbyist in his closet.  Spoke with C-PA and patient is WBAT to LLE. Restrictions Weight Bearing Restrictions: Yes LLE Weight Bearing: Weight bearing as tolerated    Mobility  Bed Mobility Overal bed mobility: Needs Assistance Bed Mobility: Rolling;Supine to Sit;Sit to Supine Rolling: Modified independent (Device/Increase time)   Supine to sit: Modified independent (Device/Increase time) Sit to supine: Modified independent (Device/Increase time)   General bed mobility comments: able to sit up without difficulty today; slight decrease in time  Transfers Overall transfer level: Needs assistance Equipment used: Rolling walker (2 wheeled) Transfers: Sit to/from [facility] Sit to Stand: Supervision         General transfer comment: supervision for intial balance. Patient verbalized steps to stand up with walker safely  Ambulation/Gait Ambulation/Gait assistance: Supervision Gait Distance (Feet): 10 Feet Assistive device:  Rolling walker (2 wheeled) Gait Pattern/deviations: Step-to pattern;Decreased stance time - left;Decreased step length - right;Decreased weight shift to left;Shuffle;Trunk flexed Gait velocity: slowed Gait velocity interpretation: <1.8 ft/sec, indicate of risk for recurrent falls General Gait Details: light min A progressing to heavy min A due to increased L foot pain and knee buckling with last 20 feet ambulation, vc for increased UE support on RW to advance R LE, cuing for upright posture, mildly unsteady, no overt LoB   Stairs Stairs: Yes Stairs assistance: Min assist Stair Management: No rails Number of Stairs: 2 General stair comments: Patient has two steps at home with no rails. Therapy reviewed with apaitnet how to go up and down steps leding with good leg up and surgical leg down. He was gaurded by PT and daughter. His daughter will be home with him.   Wheelchair Mobility    Modified Rankin (Stroke Patients Only)       Balance Overall balance assessment: Needs assistance Sitting-balance support: No upper extremity supported Sitting balance-Leahy Scale: Good     Standing balance support: Bilateral upper extremity supported Standing balance-Leahy Scale: Poor Standing balance comment: needs UE for support                            Cognition Arousal/Alertness: Awake/alert Behavior During Therapy: WFL for tasks assessed/performed Overall Cognitive Status: Within Functional Limits for tasks assessed                                        Exercises  General Comments        Pertinent Vitals/Pain Pain Assessment: 0-10 Pain Score: 4  Pain Location: L foot Pain Descriptors / Indicators: Sharp;Throbbing;Tightness Pain Intervention(s): Limited activity within patient's tolerance    Home Living                      Prior Function            PT Goals (current goals can now be found in the care plan section) Acute Rehab PT  Goals Patient Stated Goal: I just want to take care of myself PT Goal Formulation: With patient/family Time For Goal Achievement: 02/28/20 Potential to Achieve Goals: Good Progress towards PT goals: Progressing toward goals    Frequency    Min 3X/week      PT Plan Current plan remains appropriate    Co-evaluation              AM-PAC PT "6 Clicks" Mobility   Outcome Measure  Help needed turning from your back to your side while in a flat bed without using bedrails?: None Help needed moving from lying on your back to sitting on the side of a flat bed without using bedrails?: None Help needed moving to and from a bed to a chair (including a wheelchair)?: A Little Help needed standing up from a chair using your arms (e.g., wheelchair or bedside chair)?: A Little Help needed to walk in hospital room?: A Little Help needed climbing 3-5 steps with a railing? : A Lot 6 Click Score: 19    End of Session Equipment Utilized During Treatment: Gait belt Activity Tolerance: Patient tolerated treatment well Patient left: with call bell/phone within reach;with family/visitor present;in bed Nurse Communication: Mobility status;Other (comment);Patient requests pain meds PT Visit Diagnosis: Unsteadiness on feet (R26.81);Other abnormalities of gait and mobility (R26.89);Muscle weakness (generalized) (M62.81);Difficulty in walking, not elsewhere classified (R26.2);Pain Pain - Right/Left: Left Pain - part of body: Ankle and joints of foot     Time: 2233-6122 PT Time Calculation (min) (ACUTE ONLY): 20 min  Charges:  $Gait Training: 8-22 mins             Carney Living PT DPT  02/15/2020, 10:13 AM

## 2020-02-15 NOTE — Progress Notes (Addendum)
  Progress Note    02/15/2020 8:34 AM 3 Days Post-Op  Subjective:  Feels good overall. Says that he is getting more comfortable with ambulating   Vitals:   02/14/20 2318 02/15/20 0323  BP: 108/62 110/64  Pulse: 73 100  Resp: 18 18  Temp: 98.7 F (37.1 C) 98.7 F (37.1 C)  SpO2: 98% 98%   Physical Exam: Cardiac: regular Lungs: non labored Incisions:  Right TMA site clean, dry and intact. Looks good. No drainage. There is mild erythema of the dorsal flap. Does not appear infected Extremities:  Bilateral lower extremities well perfused and warm Neurologic: alert and oriented  CBC    Component Value Date/Time   WBC 10.3 02/14/2020 0114   RBC 3.10 (L) 02/14/2020 0114   HGB 9.4 (L) 02/14/2020 0114   HCT 27.8 (L) 02/14/2020 0114   PLT 179 02/14/2020 0114   MCV 89.7 02/14/2020 0114   MCH 30.3 02/14/2020 0114   MCHC 33.8 02/14/2020 0114   RDW 12.8 02/14/2020 0114   LYMPHSABS 1,560 09/11/2019 1552   MONOABS 0.9 10/17/2017 1116   EOSABS 242 09/11/2019 1552   BASOSABS 47 09/11/2019 1552    BMET    Component Value Date/Time   NA 133 (L) 02/14/2020 0114   NA 137 08/12/2013 1347   K 4.2 02/14/2020 0114   CL 101 02/14/2020 0114   CO2 25 02/14/2020 0114   GLUCOSE 221 (H) 02/14/2020 0114   BUN 17 02/14/2020 0114   BUN 20 08/12/2013 1347   CREATININE 1.36 (H) 02/14/2020 0114   CREATININE 1.28 (H) 09/11/2019 1552   CALCIUM 8.3 (L) 02/14/2020 0114   GFRNONAA 55 (L) 02/14/2020 0114   GFRAA >60 03/18/2017 2229    INR    Component Value Date/Time   INR 1.6 (H) 02/11/2020 1858   INR 1.7 04/27/2010 0932     Intake/Output Summary (Last 24 hours) at 02/15/2020 0834 Last data filed at 02/15/2020 0650 Gross per 24 hour  Intake 181.81 ml  Output 1880 ml  Net -1698.19 ml     Assessment/Plan:  74 y.o. male is s/p right TMA 3 Days Post-Op. TMA looks good with viable flaps. Pain well controlled. Post op hypotension has improved. Will have patient continue to hold  hypertensive medications and follow up with PCP in near future. Likely he has been over treated for this. Patient should have all DME needs. He will discharge home with [facility] PT/OT. His family has arranged care to be with him so he is not interested in SNF. He is stable for discharge home. He will follow up with Dr. Stanford Breed in 3-4 weeks  DVT prophylaxis:  Rivaroxaban   Karoline Caldwell, PA-C [facility] [phone] 02/15/2020 8:34 AM   I have seen and evaluated the patient. I agree with the PA note as documented above.  74 year old male status post right TMA.  Overall looks good.  Pain is well controlled.  We will plan discharge today with follow-up in 3 weeks for suture removal.  He will need a rolling walker per therapy recommendations.  I will send him home with 5 days of Augmentin.  He knows to call with questions or concerns.  He has a Darco shoe and we talked about weightbearing on the heel.  Marty Heck, MD [facility] [place] Office: [phone]

## 2020-02-15 NOTE — Progress Notes (Signed)
Contacted case manager about rolling walker. Per  Case manager, home health agent( [facility]) did not pick up the phone and left the message. Explained to pt about the situation.   Lavenia Atlas, RN

## 2020-02-15 NOTE — Plan of Care (Signed)

## 2020-02-15 NOTE — Discharge Instructions (Signed)
Transmetatarsal Amputation, Care After This sheet gives you information about how to care for yourself after your procedure. Your health care provider may also give you more specific instructions. If you have problems or questions, contact your health care provider.  What can I expect after the procedure? After the procedure, it is common to have:  A little blood or fluid coming from your incision.  Pain from your incision.  Pain that feels like it is coming from the leg that has been removed (phantom pain). This can last for a year or longer.  Skin breakdown on your stump  Feelings of depression, anxiety, and fear.  Follow these instructions at home: Medicines  Take over-the-counter and prescription medicines only as told by your health care provider.  If you were prescribed an antibiotic medicine, take it as told by your health care provider. Do not stop taking the antibiotic even if you start to feel better.  Bathing  Do not take baths, swim, use a hot tub, or get your residual limb wet until your health care provider approves. You may only be allowed to take sponge baths.  Ask your health care provider when you may start taking showers. After taking a shower, make sure to rinse and dry your residual limb carefully.  Incision care   Check your residual limb, especially your incision area, every day. Check for: ? More redness, swelling, or pain. ? More fluid or blood. ? Warmth. ? Pus or a bad smell. ? Blisters. ? Scrapes.  Follow instructions from your health care provider about how to take care of your incision. Make sure you: ? Wash your hands with soap and water before you change your bandage (dressing). If soap and water are not available, use hand sanitizer. ? Change your dressing as told by your health care provider. ? Leave stitches (sutures), skin glue, or adhesive strips in place. These skin closures may need to stay in place for 2 weeks or longer. If adhesive  strip edges start to loosen and curl up, you may trim the loose edges. Do not remove adhesive strips completely unless your health care provider tells you to do that. Activity  Return to your normal activities as told by your health care provider. Ask your health care provider what activities are safe for you.  Do physical therapy exercises as told by your health care provider.  If you have been fitted with an artificial leg (prosthesis) or have been given crutches, use them as told by your health care provider.  Eating and drinking  Eat a healthy diet that includes whole grains, fruits and vegetables, low-fat dairy products, and lean proteins.  Drink enough fluid to keep your urine pale yellow.  Driving  Work with an occupational therapist to learn new strategies for safe driving with an amputation.  Do not drive or use heavy equipment while taking prescription pain medicine.  General instructions  To prevent or treat constipation while you are taking prescription pain medicine, your health care provider may recommend that you: ? Drink enough fluid to keep your urine pale yellow. ? Take over-the-counter or prescription medicines. ? Eat foods that are high in fiber, such as fresh fruits and vegetables, whole grains, and beans. ? Limit foods that are high in fat and processed sugars, such as fried and sweet foods.  Do not use oils, lotion, cream, or rubbing alcohol on the remaining part of your leg.  Wear compression stockings as told by your health care provider.  If you have trouble coping with your amputation, contact your health care provider. Some feelings of depression, anxiety, or fear are normal after an amputation, but if you struggle with these feelings or if they get overwhelming, your provider may be able to recommend a therapist or support group to help you.  Do not use any products that contain nicotine or tobacco, such as cigarettes and e-cigarettes. These can delay  bone healing. If you need help quitting, ask your health care provider.  Keep all follow-up visits as told by your health care provider. This is important.  Contact a health care provider if:  You have a fever.  You have more tenderness in your residual limb.  You have a rash or itchy skin.  You have a cough or chills and you feel achy and weak.  You have trouble coping with your amputation.  You have blisters or scrapes on your residual limb.  Get help right away if:  You have severe pain in your residual limb.  You have more redness, swelling, or pain around your incision.  You have more fluid or blood coming from your incision.  Your incision feels warm to the touch, tender, and painful.  You have pus or a bad smell coming from your incision.  You feel light-headed and have shortness of breath.  You have blood-soaked bandages.  You cough up blood.  You have chest pain or pain when taking a deep breath or coughing. If you have these symptoms, do not drive yourself to the hospital. Call emergency services right away. If you ever feel like you may hurt yourself or others, or have thoughts about taking your own life, get help right away. You can go to your nearest emergency department or call:  Your local emergency services (911 in the [place]  A suicide crisis helpline, such as the [place] at 719-132-5088. This is open 24 hours a day.  Summary  After a leg amputation, you may have pain that feels like it is coming from the leg that was removed (phantom pain). This can last for a year or longer.  Follow instructions from your health care provider about how to take care of your incision.  Check your residual limb, especially your incision area, every day. More redness, swelling, or pain may be a sign of infection.  Contact your health care provider if you have trouble coping with your amputation. This information is not intended to  replace advice given to you by your health care provider. Make sure you discuss any questions you have with your health care provider. Document Revised: 05/25/2016 Document Reviewed: 05/25/2016 Elsevier Patient Education  [place].

## 2020-02-15 NOTE — TOC Transition Note (Signed)
Transition of Care [facility]) - CM/SW Discharge Note   Patient Details  Name: [name] MRN: [id number] Date of Birth: 1945-10-27  Transition of Care [facility]) CM/SW Contact:  Zenon Mayo, RN Phone Number: 02/15/2020, 11:11 AM   Clinical Narrative:    NCM spoke with daughter who was in the room with patient, offered choice , she states she does not have a preference.  NCM made referral to [facility] with Alvis Lemmings for [facility] [place] [place] will begin 24 to 48 hrs post dc.  NCM made referral to [place] with [facility] for 3 n 1 and rolling walker, this will be brought to room prior to dc.   Final next level of care: [place] Barriers to Discharge: No Barriers Identified   Patient Goals and CMS Choice Patient states their goals for this hospitalization and ongoing recovery are:: get better CMS Medicare.gov Compare Post Acute Care list provided to:: Patient Represenative (must comment) Choice offered to / list presented to : Adult Children  Discharge Placement                       Discharge Plan and Services In-house Referral: Clinical Social Work              DME Arranged: 3-N-1,Walker rolling DME Agency: [facility] Date DME Agency Contacted: 02/15/20 Time DME Agency Contacted: [phone] Representative spoke with at DME Agency: [place]: RN,PT,OT [place] Agency: [place] Date [place]: 02/15/20 Time [place]: 1111 Representative spoke with at [place]: [place] ([place]) Interventions     Readmission Risk Interventions No flowsheet data found.

## 2020-02-15 NOTE — Progress Notes (Signed)
Occupational Therapy Treatment Patient Details Name: [name] MRN: [id number] DOB: 1945/06/06 Today's Date: 02/15/2020    History of present illness 74 y.o. 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  His daughter will be providing initial 24 hour assist as needed and [name] services will be seeing him post acute.  Patient continues to progress towards stated goals.  Patient demonstrating better mobility, improved balance and less assist for lower body ADL.  OT will continue to follow if he remains in the acute setting.    Follow Up Recommendations  Home health OT    Equipment Recommendations  3 in 1 bedside commode;Tub/shower seat    Recommendations for Other Services      Precautions / Restrictions Precautions Precautions: Fall Required Braces or Orthoses: Other Brace Other Brace: has a Lobbyist in his closet.  Spoke with C-PA and patient is WBAT to LLE. Restrictions Weight Bearing Restrictions: Yes LLE Weight Bearing: Weight bearing as tolerated       Mobility Bed Mobility Overal bed mobility: Needs Assistance Bed Mobility: Supine to Sit;Sit to Supine Rolling: Modified independent (Device/Increase time)   Supine to sit: Modified independent (Device/Increase time) Sit to supine: Modified independent (Device/Increase time)   General bed mobility comments: able to sit up without difficulty today; slight decrease in time  Transfers Overall transfer level: Needs assistance Equipment used: Rolling walker (2 wheeled) Transfers: Sit to/from [facility] Sit to Stand: Supervision Stand pivot transfers: Supervision       General transfer comment: supervision for intial balance. Patient verbalized steps to stand up with walker safely    Balance Overall balance assessment: Needs assistance Sitting-balance support: No upper extremity  supported Sitting balance-Leahy Scale: Good     Standing balance support: Bilateral upper extremity supported Standing balance-Leahy Scale: Poor Standing balance comment: needs UE for support                           ADL either performed or assessed with clinical judgement   ADL                   Upper Body Dressing : Set up;Sitting   Lower Body Dressing: Sit to/from stand;Supervision/safety                                         Cognition Arousal/Alertness: Awake/alert Behavior During Therapy: WFL for tasks assessed/performed Overall Cognitive Status: Within Functional Limits for tasks assessed                                 General Comments: ST memory deficts noted        Exercises     Shoulder Instructions       General Comments  VSS on RA    Pertinent Vitals/ Pain       Pain Assessment: 0-10 Pain Score: 4  Faces Pain Scale: Hurts little more Pain Location: L foot Pain Descriptors / Indicators: Sharp;Throbbing;Tightness Pain Intervention(s): Monitored during session  Frequency  Min 2X/week        Progress Toward Goals  OT Goals(current goals can now be found in the care plan section)  Progress towards OT goals: Progressing toward goals  Acute Rehab OT Goals Patient Stated Goal: Get to where I was before OT Goal Formulation: With patient Time For Goal Achievement: 02/27/20 Potential to Achieve Goals: Good  Plan      Co-evaluation                 AM-PAC OT "6 Clicks" Daily Activity     Outcome Measure   Help from another person eating meals?: None Help from another person taking care of personal grooming?: None Help from another person toileting, which includes using toliet, bedpan, or urinal?: A Little Help from another person bathing (including washing, rinsing, drying)?: A Little Help from another  person to put on and taking off regular upper body clothing?: None Help from another person to put on and taking off regular lower body clothing?: A Little 6 Click Score: 21    End of Session Equipment Utilized During Treatment: Rolling walker  OT Visit Diagnosis: Unsteadiness on feet (R26.81);Muscle weakness (generalized) (M62.81);Pain Pain - Right/Left: Left Pain - part of body: Ankle and joints of foot   Activity Tolerance Patient tolerated treatment well   Patient Left in bed;with call bell/phone within reach;with family/visitor present   Nurse Communication Mobility status        Time: 5537-4827 OT Time Calculation (min): 20 min  Charges: OT General Charges $OT Visit: 1 Visit OT Treatments $Self Care/Home Management : 8-22 mins  02/15/2020  Rich, OTR/L  Acute Rehabilitation Services  Office:  [phone]    [name] 02/15/2020, 1:42 PM

## 2020-02-17 ENCOUNTER — Other Ambulatory Visit: Payer: Self-pay

## 2020-02-17 ENCOUNTER — Ambulatory Visit (INDEPENDENT_AMBULATORY_CARE_PROVIDER_SITE_OTHER): Payer: Self-pay | Admitting: Physician Assistant

## 2020-02-17 VITALS — BP 114/64 | HR 63 | Temp 98.7°F | Resp 20 | Ht 70.0 in | Wt 188.2 lb

## 2020-02-17 DIAGNOSIS — I96 Gangrene, not elsewhere classified: Secondary | ICD-10-CM

## 2020-02-17 MED ORDER — DOXYCYCLINE HYCLATE 100 MG PO CAPS: 100.0000 mg | ORAL_CAPSULE | Freq: Two times a day (BID) | ORAL | 0 refills | Status: DC

## 2020-02-17 NOTE — Progress Notes (Signed)
POST OPERATIVE OFFICE NOTE    CC:  F/u for surgery  HPI:  This is a 74 y.o. male who is s/p right TMA on 02/12/2020  by Dr. 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  He had infection that was treated with oral antibiotics before he was admitted to the hospital.  He was on IV antibiotics while in the hospital.  He underwent TMA right foot 02/12/2020  and was discharged on 02/15/20 on Augmentin.  He comes in today with erythema and edema in the right TMA.    He denise fever and chills.        Allergies  Allergen Reactions  . Salmon [Fish Allergy] Other (See Comments)    Only canned/ not fresh (probably preservatives).   . Sulfa Antibiotics     Not sure has allergyu to this Only canned/ not fresh (probably preservatives).   . Metformin And Related Other (See Comments)    diarrhea    Current Outpatient Medications  Medication Sig Dispense Refill  . aspirin EC 81 MG tablet Take 81 mg by mouth daily. Swallow whole.    . citalopram (CELEXA) 10 MG tablet TAKE 1 TABLET BY MOUTH EVERY DAY (Patient taking differently: Take 10 mg by mouth daily.) 90 tablet 3  . Continuous Blood Gluc Receiver (FREESTYLE LIBRE 14 DAY READER) DEVI USE AS DIRECTED FOR 1 DOSE 1 each 0  . Continuous Blood Gluc Sensor (FREESTYLE LIBRE 14 DAY SENSOR) MISC APPLY sensor EVERY 14 DAYS 6 each 3  . dofetilide (TIKOSYN) 500 MCG capsule TAKE 1 CAPSULE BY MOUTH 2 TIMES DAILY (Patient taking differently: Take 500 mcg by mouth 2 (two) times daily.) 60 capsule 0  . doxycycline (VIBRAMYCIN) 100 MG capsule Take 1 capsule (100 mg total) by mouth 2 (two) times daily. 28 capsule 0  . eplerenone (INSPRA) 25 MG tablet Take 25 mg by mouth daily.    . fluticasone (FLONASE) 50 MCG/ACT nasal spray USE 2 SPRAYS IN EACH NOSTRIL EVERY DAY (Patient taking differently: Place 2 sprays into both nostrils daily.) 16 g 5  . Glucose Blood (FREESTYLE PRECISION NEO TEST VI) by In Vitro route.    Marland Kitchen glucose blood (ONE TOUCH ULTRA TEST) test strip 1  each by Other route 2 (two) times daily. 200 each 2  . insulin glargine (LANTUS SOLOSTAR) 100 UNIT/ML Solostar Pen Inject 14 Units into the skin daily. (Patient taking differently: Inject 15 Units into the skin daily.) 15 mL 11  . insulin lispro (ADMELOG) 100 UNIT/ML injection Inject 0.1 mLs (10 Units total) into the skin 3 (three) times daily before meals. 10 mL 11  . Lancets MISC Use as directed up to 4 times per day 100 each 11  . liraglutide (VICTOZA) 18 MG/3ML SOPN Inject 1.8 mg into the skin every evening.    . loratadine (CLARITIN) 10 MG tablet Take 10 mg by mouth daily.    Marland Kitchen LORazepam (ATIVAN) 1 MG tablet TAKE 1/2 TABLET BY MOUTH EVERY DAY AS NEEDED FOR ANXIETY (Patient taking differently: Take 0.5 mg by mouth 2 (two) times daily as needed for anxiety or sleep.) 45 tablet 1  . Magnesium Oxide (MAG-OX 400 PO) Take 400 mg by mouth 2 (two) times daily.    . metoprolol (TOPROL-XL) 200 MG 24 hr tablet Take 200 mg by mouth daily.    . Multiple Vitamins-Minerals (MULTIVITAMIN PO) Take 1 tablet by mouth daily.    Marland Kitchen NITROSTAT 0.4 MG SL tablet Place 1 tablet (0.4 mg total) under the  tongue every 5 (five) minutes as needed for chest pain. 25 tablet 5  . oxyCODONE-acetaminophen (PERCOCET) 5-325 MG tablet Take 1 tablet by mouth every 4 (four) hours as needed for severe pain. 30 tablet 0  . rosuvastatin (CRESTOR) 20 MG tablet TAKE 1 TABLET BY MOUTH EVERY DAY (Patient taking differently: Take 20 mg by mouth every evening.) 90 tablet 0  . ULTICARE MICRO PEN NEEDLES 32G X 4 MM MISC USE DAILY AS DIRECTED 100 each 3  . XARELTO 20 MG TABS tablet TAKE 1 TABLET BY MOUTH EVERY DAY (Patient taking differently: Take 20 mg by mouth daily with supper.) 30 tablet 5  . zolpidem (AMBIEN) 5 MG tablet TAKE 1 OR 2 TABLETS BY MOUTH NIGHTLY AT BEDTIME AS NEEDED (Patient taking differently: Take 10 mg by mouth at bedtime as needed for sleep.) 30 tablet 0   No current facility-administered medications for this visit.      ROS:  See HPI  Physical Exam:      Incision:  Intact with clear/ SS drainage.   Motor intact of right ankle Doppler signals    Assessment/Plan:  This is a 74 y.o. male who is s/p:TMA with edema and erythema. I will have him stop the Augmentin and start Doxycycline per DR. Brabham's recommendation after seeing the patient with me.  He will keep using dry dressing daily and elevation in a supine position to assist with the edema.     F/U Thursday for re check of incision.    Roxy Horseman PA-C [facility] [phone]  Clinic MD:  Trula Slade

## 2020-02-20 ENCOUNTER — Ambulatory Visit (INDEPENDENT_AMBULATORY_CARE_PROVIDER_SITE_OTHER): Payer: Self-pay | Admitting: Physician Assistant

## 2020-02-20 ENCOUNTER — Telehealth: Payer: Self-pay

## 2020-02-20 ENCOUNTER — Other Ambulatory Visit: Payer: Self-pay

## 2020-02-20 VITALS — BP 83/48 | HR 79 | Temp 98.2°F | Resp 20 | Ht 70.0 in | Wt 188.0 lb

## 2020-02-20 DIAGNOSIS — I96 Gangrene, not elsewhere classified: Secondary | ICD-10-CM

## 2020-02-20 NOTE — Telephone Encounter (Signed)
Patient's family called, patient is having some hypotension - BP 80s/60s. He is dizzy and having difficulty getting to the car.They are giving him water/gatorade and monitoring. Instructed to call PCP and/or 911. They had appt at 0915, and they would really like to keep it. I advised them appt was not cancelled, if they were able to come in today -we would fit them in. Patient is currently lying down and BP is rising. Family will call back if unable to come in today.

## 2020-02-20 NOTE — Progress Notes (Signed)
POST OPERATIVE OFFICE NOTE    CC:  F/u for surgery  HPI:  This is a 74 y.o. male who is s/p right TMA on 02/12/2020  by Dr. 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  He had infection that was treated with oral antibiotics before he was admitted to the hospital.  He was on IV antibiotics while in the hospital.  He underwent TMA right foot 02/12/2020  and was discharged on 02/15/20 on Augmentin.  He comes in today with erythema and edema in the right TMA.    He is here for incision check he was last seen 02/17/20.  Allergies  Allergen Reactions  . Salmon [Fish Allergy] Other (See Comments)    Only canned/ not fresh (probably preservatives).   . Sulfa Antibiotics     Not sure has allergyu to this Only canned/ not fresh (probably preservatives).   . Metformin And Related Other (See Comments)    diarrhea    Current Outpatient Medications  Medication Sig Dispense Refill  . aspirin EC 81 MG tablet Take 81 mg by mouth daily. Swallow whole.    . citalopram (CELEXA) 10 MG tablet TAKE 1 TABLET BY MOUTH EVERY DAY (Patient taking differently: Take 10 mg by mouth daily.) 90 tablet 3  . Continuous Blood Gluc Receiver (FREESTYLE LIBRE 14 DAY READER) DEVI USE AS DIRECTED FOR 1 DOSE 1 each 0  . Continuous Blood Gluc Sensor (FREESTYLE LIBRE 14 DAY SENSOR) MISC APPLY sensor EVERY 14 DAYS 6 each 3  . dofetilide (TIKOSYN) 500 MCG capsule TAKE 1 CAPSULE BY MOUTH 2 TIMES DAILY (Patient taking differently: Take 500 mcg by mouth 2 (two) times daily.) 60 capsule 0  . doxycycline (VIBRAMYCIN) 100 MG capsule Take 1 capsule (100 mg total) by mouth 2 (two) times daily. 28 capsule 0  . eplerenone (INSPRA) 25 MG tablet Take 25 mg by mouth daily.    . fluticasone (FLONASE) 50 MCG/ACT nasal spray USE 2 SPRAYS IN EACH NOSTRIL EVERY DAY (Patient taking differently: Place 2 sprays into both nostrils daily.) 16 g 5  . Glucose Blood (FREESTYLE PRECISION NEO TEST VI) by In Vitro route.    Marland Kitchen glucose blood (ONE TOUCH ULTRA  TEST) test strip 1 each by Other route 2 (two) times daily. 200 each 2  . insulin glargine (LANTUS SOLOSTAR) 100 UNIT/ML Solostar Pen Inject 14 Units into the skin daily. (Patient taking differently: Inject 15 Units into the skin daily.) 15 mL 11  . insulin lispro (ADMELOG) 100 UNIT/ML injection Inject 0.1 mLs (10 Units total) into the skin 3 (three) times daily before meals. 10 mL 11  . Lancets MISC Use as directed up to 4 times per day 100 each 11  . liraglutide (VICTOZA) 18 MG/3ML SOPN Inject 1.8 mg into the skin every evening.    . loratadine (CLARITIN) 10 MG tablet Take 10 mg by mouth daily.    Marland Kitchen LORazepam (ATIVAN) 1 MG tablet TAKE 1/2 TABLET BY MOUTH EVERY DAY AS NEEDED FOR ANXIETY (Patient taking differently: Take 0.5 mg by mouth 2 (two) times daily as needed for anxiety or sleep.) 45 tablet 1  . Magnesium Oxide (MAG-OX 400 PO) Take 400 mg by mouth 2 (two) times daily.    . metoprolol (TOPROL-XL) 200 MG 24 hr tablet Take 200 mg by mouth daily.    . Multiple Vitamins-Minerals (MULTIVITAMIN PO) Take 1 tablet by mouth daily.    Marland Kitchen NITROSTAT 0.4 MG SL tablet Place 1 tablet (0.4 mg total) under the  tongue every 5 (five) minutes as needed for chest pain. 25 tablet 5  . oxyCODONE-acetaminophen (PERCOCET) 5-325 MG tablet Take 1 tablet by mouth every 4 (four) hours as needed for severe pain. 30 tablet 0  . rosuvastatin (CRESTOR) 20 MG tablet TAKE 1 TABLET BY MOUTH EVERY DAY (Patient taking differently: Take 20 mg by mouth every evening.) 90 tablet 0  . ULTICARE MICRO PEN NEEDLES 32G X 4 MM MISC USE DAILY AS DIRECTED 100 each 3  . XARELTO 20 MG TABS tablet TAKE 1 TABLET BY MOUTH EVERY DAY (Patient taking differently: Take 20 mg by mouth daily with supper.) 30 tablet 5  . zolpidem (AMBIEN) 5 MG tablet TAKE 1 OR 2 TABLETS BY MOUTH NIGHTLY AT BEDTIME AS NEEDED (Patient taking differently: Take 10 mg by mouth at bedtime as needed for sleep.) 30 tablet 0   No current facility-administered medications for  this visit.     ROS:  See HPI  Physical Exam:      Incision:  Intact with SS bloody drainage Foot well perfused with intact motor and diminished edema. Brisk cap refill on the foot dorsum  Assessment/Plan:  This is a 74 y.o. male who is s/p: TMA  Does not appear ischemic or infected Of note he was hypotensive today and has contacted his cardiologist who has discontinued some medications to help.  He was also hyperglycemic this am and is following serial BG with his glucose monitor.     He will f/u in 2 weeks with Dr. Stanford Breed for his 4 week post op check up.  If he has changes at the incision site, opening of the incision or rest pain he will call sooner.    Roxy Horseman PA-C [facility] [phone]  Clinic MD:  Scot Dock

## 2020-02-24 ENCOUNTER — Telehealth: Payer: Self-pay | Admitting: Endocrinology

## 2020-02-24 NOTE — Telephone Encounter (Signed)
Patient said his freestyle is not working and wanted to let Dr Everardo All know and see what Dr Everardo All suggests that he does, he says he's had a few issues with it and didn't know if he should try another device or if we could try and call in another device? Patient asked if someone could give him a call to discuss what he could do. Ph# [phone]

## 2020-02-25 DIAGNOSIS — Z570 Occupational exposure to noise: Secondary | ICD-10-CM | POA: Diagnosis not present

## 2020-02-25 DIAGNOSIS — E119 Type 2 diabetes mellitus without complications: Secondary | ICD-10-CM | POA: Diagnosis not present

## 2020-02-25 DIAGNOSIS — H903 Sensorineural hearing loss, bilateral: Secondary | ICD-10-CM | POA: Diagnosis not present

## 2020-02-25 NOTE — Telephone Encounter (Signed)
Linda,  Please see below 

## 2020-02-25 NOTE — Telephone Encounter (Signed)
Please see below.

## 2020-02-25 NOTE — Telephone Encounter (Signed)
Please offer ref to [place] or Vernona Rieger.  I cal put in ref if necessary

## 2020-02-26 NOTE — Telephone Encounter (Signed)
Patient reports that he is having trouble with the sensor not working after 1-3 days of use.  This is the second one in 2 days.  I encouraged him to call the help line and to discuss this with them.  He is taking 500mg . Of Vit. C daily, and he was told to ask the people if this is what might be causing this problem.  He agreed to do this.

## 2020-02-27 DIAGNOSIS — Z95810 Presence of automatic (implantable) cardiac defibrillator: Secondary | ICD-10-CM | POA: Diagnosis not present

## 2020-02-27 DIAGNOSIS — Z4502 Encounter for adjustment and management of automatic implantable cardiac defibrillator: Secondary | ICD-10-CM | POA: Diagnosis not present

## 2020-03-04 ENCOUNTER — Telehealth: Payer: Self-pay

## 2020-03-04 DIAGNOSIS — Z79899 Other long term (current) drug therapy: Secondary | ICD-10-CM | POA: Diagnosis not present

## 2020-03-04 DIAGNOSIS — K5900 Constipation, unspecified: Secondary | ICD-10-CM | POA: Diagnosis not present

## 2020-03-04 DIAGNOSIS — I959 Hypotension, unspecified: Secondary | ICD-10-CM | POA: Diagnosis not present

## 2020-03-04 DIAGNOSIS — H9190 Unspecified hearing loss, unspecified ear: Secondary | ICD-10-CM | POA: Diagnosis not present

## 2020-03-04 DIAGNOSIS — Z7901 Long term (current) use of anticoagulants: Secondary | ICD-10-CM | POA: Diagnosis not present

## 2020-03-04 DIAGNOSIS — E1151 Type 2 diabetes mellitus with diabetic peripheral angiopathy without gangrene: Secondary | ICD-10-CM | POA: Diagnosis not present

## 2020-03-04 DIAGNOSIS — Z4781 Encounter for orthopedic aftercare following surgical amputation: Secondary | ICD-10-CM | POA: Diagnosis not present

## 2020-03-04 DIAGNOSIS — I4891 Unspecified atrial fibrillation: Secondary | ICD-10-CM | POA: Diagnosis not present

## 2020-03-04 DIAGNOSIS — Z9181 History of falling: Secondary | ICD-10-CM | POA: Diagnosis not present

## 2020-03-04 DIAGNOSIS — Z7982 Long term (current) use of aspirin: Secondary | ICD-10-CM | POA: Diagnosis not present

## 2020-03-04 DIAGNOSIS — Z794 Long term (current) use of insulin: Secondary | ICD-10-CM | POA: Diagnosis not present

## 2020-03-04 DIAGNOSIS — I10 Essential (primary) hypertension: Secondary | ICD-10-CM | POA: Diagnosis not present

## 2020-03-04 DIAGNOSIS — Z89421 Acquired absence of other right toe(s): Secondary | ICD-10-CM | POA: Diagnosis not present

## 2020-03-04 NOTE — Telephone Encounter (Signed)
Via Mychart message & pictures, patient's foot is red/purple and somewhat swollen. Discussed with PA, patient can make sooner appt or keep f/u appt Tuesday. Per daughter, patient is not running a fever, and wound is not draining or painful. They will continue to monitor it and call for earlier appt if patient develops further problems. Also, advised patient may drive if he felt like he could - per PT patient's mobility is "pretty good" and he has been off pain medication for 10 days.

## 2020-03-05 ENCOUNTER — Telehealth: Payer: Self-pay | Admitting: Dietician

## 2020-03-10 ENCOUNTER — Encounter: Payer: Medicare Other | Admitting: Vascular Surgery

## 2020-03-10 DIAGNOSIS — I5020 Unspecified systolic (congestive) heart failure: Secondary | ICD-10-CM | POA: Diagnosis not present

## 2020-03-11 NOTE — Telephone Encounter (Signed)
LVM to call me back.  Telephone number given

## 2020-03-17 NOTE — Telephone Encounter (Signed)
Patient reports that he has stopped using the LIbre 14 day sensors because he can not get his app to read this.  I suggested he come by the office to give him a reader.  He agreed to do this. I have left one up front with his name on it.

## 2020-03-24 ENCOUNTER — Other Ambulatory Visit: Payer: Self-pay

## 2020-03-24 ENCOUNTER — Ambulatory Visit (INDEPENDENT_AMBULATORY_CARE_PROVIDER_SITE_OTHER): Payer: Self-pay | Admitting: Vascular Surgery

## 2020-03-24 VITALS — BP 93/59 | HR 80 | Temp 98.3°F | Wt 189.8 lb

## 2020-03-24 DIAGNOSIS — Z89431 Acquired absence of right foot: Secondary | ICD-10-CM

## 2020-03-24 NOTE — Progress Notes (Signed)
   ASSESSMENT & PLAN:  ORVIE CARADINE is a 75 y.o. male s/p R TMA 02/12/20 for wet gangrene. Totally healed.  No need for follow up. Continue working on best medical therapy / risk prevention. Follow up PRN.  SUBJECTIVE:  No complaints. More active. Working in his [place].  OBJECTIVE:  BP (!) 93/59 (BP Location: Left Arm)   Pulse 80   Temp 98.3 F (36.8 C) (Skin)   Wt 189 lb 12.8 oz (86.1 kg)   SpO2 99%   BMI 27.23 kg/m   R TMA healed.  CBC Latest Ref Rng & Units 02/14/2020 02/13/2020 02/12/2020  WBC 4.0 - 10.5 K/uL 10.3 14.7(H) 7.7  Hemoglobin 13.0 - 17.0 g/dL 9.4(L) 11.7(L) 12.2(L)  Hematocrit 39.0 - 52.0 % 27.8(L) 35.1(L) 36.8(L)  Platelets 150 - 400 K/uL 179 235 206     CMP Latest Ref Rng & Units 02/14/2020 02/13/2020 02/12/2020  Glucose 70 - 99 mg/dL 221(H) 203(H) -  BUN 8 - 23 mg/dL 17 14 -  Creatinine 0.61 - 1.24 mg/dL 1.36(H) 1.47(H) 1.20  Sodium 135 - 145 mmol/L 133(L) 135 -  Potassium 3.5 - 5.1 mmol/L 4.2 4.9 -  Chloride 98 - 111 mmol/L 101 99 -  CO2 22 - 32 mmol/L 25 27 -  Calcium 8.9 - 10.3 mg/dL 8.3(L) 9.0 -  Total Protein 6.5 - 8.1 g/dL - - -  Total Bilirubin 0.3 - 1.2 mg/dL - - -  Alkaline Phos 38 - 126 U/L - - -  AST 15 - 41 U/L - - -  ALT 0 - 44 U/L - - -    CrCl cannot be calculated (Patient's most recent lab result is older than the maximum 21 days allowed.).  Yevonne Aline. Stanford Breed, MD [facility] Phone Number: [phone] 03/24/2020 8:50 AM

## 2020-03-26 ENCOUNTER — Other Ambulatory Visit: Payer: Self-pay | Admitting: Endocrinology

## 2020-03-30 ENCOUNTER — Other Ambulatory Visit: Payer: Self-pay

## 2020-04-01 ENCOUNTER — Ambulatory Visit (INDEPENDENT_AMBULATORY_CARE_PROVIDER_SITE_OTHER): Payer: Medicare Other | Admitting: Endocrinology

## 2020-04-01 ENCOUNTER — Other Ambulatory Visit: Payer: Self-pay

## 2020-04-01 ENCOUNTER — Encounter: Payer: Self-pay | Admitting: Endocrinology

## 2020-04-01 VITALS — BP 132/86 | HR 68 | Ht 70.0 in | Wt 186.0 lb

## 2020-04-01 DIAGNOSIS — N189 Chronic kidney disease, unspecified: Secondary | ICD-10-CM

## 2020-04-01 DIAGNOSIS — E1122 Type 2 diabetes mellitus with diabetic chronic kidney disease: Secondary | ICD-10-CM

## 2020-04-01 DIAGNOSIS — E0842 Diabetes mellitus due to underlying condition with diabetic polyneuropathy: Secondary | ICD-10-CM

## 2020-04-01 LAB — POCT GLYCOSYLATED HEMOGLOBIN (HGB A1C): Hemoglobin A1C: 6.3 % — AB (ref 4.0–5.6)

## 2020-04-01 MED ORDER — LANTUS SOLOSTAR 100 UNIT/ML ~~LOC~~ SOPN: 14.0000 [IU] | PEN_INJECTOR | Freq: Every day | SUBCUTANEOUS | 11 refills | Status: DC

## 2020-04-01 NOTE — Progress Notes (Signed)
Subjective:    Patient ID: [name], male    DOB: 1946/02/02, 75 y.o.   MRN: [id number]  HPI Pt returns for f/u of diabetes mellitus: DM type: Insulin-requiring type 2.   Dx'ed: 2229 Complications: right TMA, PN, CRI, and CAD.  Therapy: insulin since mid-2018, and Victoza.   DKA: never Severe hypoglycemia: never.   Pancreatitis: never.  Pancreatic imaging: never Other: He did not tolerate metformin (diarrhea); he declines V-GO, due to cost.  He takes multiple daily injections.  Interval history: we are unable to access continuous glucose monitor data.  He seldom has hypoglycemia, and these episodes are mild.  He says he takes insulins as rx'ed.   Past Medical History:  Diagnosis Date  . CALLUS, RIGHT FOOT 12/08/2009  . Cardiac arrest ([place])   . Chronic systolic heart failure ([place]) 11/03/2009  . DIABETES MELLITUS, TYPE II 12/05/2006  . 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 HYPERLIPIDEMIA 11/25/2006  . HYPOGONADISM, MALE 12/05/2006  . Implantable cardiac defibrillator MDT 09/22/2008   Change out feb 2011, 2015  . Ischemic cardiomyopathy 09/22/2008   DES CX and RCA  70% residual LAD  Done in [place]; EF 30%;; myoviewq 08-Jun-2006 no ischemia  . Kidney stones   . Long term (current) use of anticoagulants 04/25/2010  . Myocardial infarction [facility]) 2004-06-07   "I died and they brought me back"  . 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 NEPHROLITHIASIS, HX OF 02/25/2008  . Persistent atrial fibrillation ([place]) 11/25/2006   Inapp shock 2010/06/08 AF VR >220 recurrent    Past Surgical History:  Procedure Laterality Date  . CARDIAC CATHETERIZATION     feburary 2012-06-07  . CARDIAC DEFIBRILLATOR PLACEMENT     Guidant Vitality [name]  . CARDIOVERSION N/A 04/25/2012   Procedure: CARDIOVERSION;  Surgeon: Darlin Coco, MD;  Location[facility] ENDOSCOPY;  Service: Cardiovascular;  Laterality: N/A;  . CARDIOVERSION N/A 05/21/2012   Procedure: CARDIOVERSION;  Surgeon: Deboraha Sprang, MD;  Location: [place];  Service: Cardiovascular;  Laterality: N/A;  . CATARACT EXTRACTION    . EYE SURGERY     cataracts  . IMPLANTABLE CARDIOVERTER DEFIBRILLATOR (ICD) GENERATOR CHANGE N/A 02/05/2014   Procedure: ICD GENERATOR CHANGE;  Surgeon: Deboraha Sprang, MD;  Location[facility] CATH LAB;  Service: Cardiovascular;  Laterality: N/A;  . PTCA    . TONSILLECTOMY    . TONSILLECTOMY    . 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 Marital status: Divorced    Spouse name: Not on file  . Number of children: 1  . Years of education: 63  . Highest education level: Not on file  Occupational History  . 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 Smoking status: Never Smoker  . Smokeless tobacco: Never Used  Vaping Use  . Vaping Use: Never used  Substance and Sexual Activity  . Alcohol use: Yes    Alcohol/week: 0.0 standard drinks    Comment: summer   . Drug use: No  . Sexual activity: Yes    Partners: Female  Other Topics Concern  . Not on file  Social History Narrative   HSG, College grad. Married - divorced. 1 daughter. owner/operator Research officer, trade union business and show production   Social Determinants of  Health   Financial Resource Strain: Not on file  Food Insecurity: Not on file  Transportation Needs: Not on file  Physical Activity: Not on file  Stress: Not on file  Social Connections: Not on file  Intimate Partner Violence: Not on file    Current Outpatient Medications on File Prior to Visit  Medication Sig Dispense Refill  . aspirin EC 81 MG tablet Take 81 mg by mouth daily. Swallow whole.    . citalopram (CELEXA) 10 MG tablet TAKE 1 TABLET BY MOUTH EVERY DAY (Patient taking differently: Take 10 mg by mouth daily.) 90 tablet 3  .  Continuous Blood Gluc Receiver (FREESTYLE LIBRE 14 DAY READER) DEVI USE AS DIRECTED 1 each 0  . Continuous Blood Gluc Sensor (FREESTYLE LIBRE 14 DAY SENSOR) MISC APPLY sensor EVERY 14 DAYS 6 each 3  . dofetilide (TIKOSYN) 500 MCG capsule TAKE 1 CAPSULE BY MOUTH 2 TIMES DAILY (Patient taking differently: Take 500 mcg by mouth 2 (two) times daily.) 60 capsule 0  . eplerenone (INSPRA) 25 MG tablet Take 25 mg by mouth daily.    . fluticasone (FLONASE) 50 MCG/ACT nasal spray USE 2 SPRAYS IN EACH NOSTRIL EVERY DAY (Patient taking differently: Place 2 sprays into both nostrils daily.) 16 g 5  . Glucose Blood (FREESTYLE PRECISION NEO TEST VI) by In Vitro route.    Marland Kitchen glucose blood (ONE TOUCH ULTRA TEST) test strip 1 each by Other route 2 (two) times daily. 200 each 2  . insulin lispro (ADMELOG) 100 UNIT/ML injection Inject 0.1 mLs (10 Units total) into the skin 3 (three) times daily before meals. 10 mL 11  . Lancets MISC Use as directed up to 4 times per day 100 each 11  . liraglutide (VICTOZA) 18 MG/3ML SOPN Inject 1.8 mg into the skin every evening.    . loratadine (CLARITIN) 10 MG tablet Take 10 mg by mouth daily.    Marland Kitchen LORazepam (ATIVAN) 1 MG tablet TAKE 1/2 TABLET BY MOUTH EVERY DAY AS NEEDED FOR ANXIETY (Patient taking differently: Take 0.5 mg by mouth 2 (two) times daily as needed for anxiety or sleep.) 45 tablet 1  . Magnesium Oxide (MAG-OX 400 PO) Take 400 mg by mouth 2 (two) times daily.    . metoprolol (TOPROL-XL) 200 MG 24 hr tablet Take 200 mg by mouth daily.    . Multiple Vitamins-Minerals (MULTIVITAMIN PO) Take 1 tablet by mouth daily.    Marland Kitchen NITROSTAT 0.4 MG SL tablet Place 1 tablet (0.4 mg total) under the tongue every 5 (five) minutes as needed for chest pain. 25 tablet 5  . oxyCODONE-acetaminophen (PERCOCET) 5-325 MG tablet Take 1 tablet by mouth every 4 (four) hours as needed for severe pain. 30 tablet 0  . rosuvastatin (CRESTOR) 20 MG tablet TAKE 1 TABLET BY MOUTH EVERY DAY (Patient taking  differently: Take 20 mg by mouth every evening.) 90 tablet 0  . ULTICARE MICRO PEN NEEDLES 32G X 4 MM MISC USE DAILY AS DIRECTED 100 each 3  . XARELTO 20 MG TABS tablet TAKE 1 TABLET BY MOUTH EVERY DAY (Patient taking differently: Take 20 mg by mouth daily with supper.) 30 tablet 5  . zolpidem (AMBIEN) 5 MG tablet TAKE 1 OR 2 TABLETS BY MOUTH NIGHTLY AT BEDTIME AS NEEDED (Patient taking differently: Take 10 mg by mouth at bedtime as needed for sleep.) 30 tablet 0   No current facility-administered medications on file prior to visit.    Allergies  Allergen Reactions  . Dani Gobble [employer]  Allergy] Other (See Comments)    Only canned/ not fresh (probably preservatives).   . Sulfa Antibiotics     Not sure has allergyu to this Only canned/ not fresh (probably preservatives).   . Metformin And Related Other (See Comments)    diarrhea    Family History  Problem Relation Age of Onset  . Diabetes Maternal Grandmother   . Hyperlipidemia Maternal Grandmother   . Breast cancer Mother   . Cancer Neg Hx   . COPD Neg Hx   . Heart disease Neg Hx     BP 132/86   Pulse 68   Ht 5\' 10"  (1.778 m)   Wt 186 lb (84.4 kg)   SpO2 97%   BMI 26.69 kg/m    Review of Systems Denies LOC    Objective:   Physical Exam VITAL SIGNS:  See vs page GENERAL: no distress Pulses: dorsalis pedis intact bilat.   MSK: no deformity of the left foot.  Right TMA CV: no leg edema.  Skin:  no ulcer on the feet, but there are heavy calluses on the feet.  normal color and temp on the feet. Neuro: sensation is intact to touch on the feet, but decreased from normal Ext: there is bilateral onychomycosis of the left foot toenails.    Lab Results  Component Value Date   HGBA1C 6.3 (A) 04/01/2020       Assessment & Plan:  Insulin-requiring type 2 DM, with CRI Hypoglycemia, due to insulin.   Patient Instructions  check your blood sugar twice a day.  vary the time of day when you check, between before the 3 meals, and  at bedtime.  also check if you have symptoms of your blood sugar being too high or too low.  please keep a record of the readings and bring it to your next appointment here (or you can bring the meter itself).  You can write it on any piece of paper.  please call us sooner if your blood sugar goes below 70, or if you have a lot of readings over 200.   Please change the insulins to the numbers listed below.   Please come back for a follow-up appointment in 3 months.

## 2020-04-01 NOTE — Patient Instructions (Signed)
check your blood sugar twice a day.  vary the time of day when you check, between before the 3 meals, and at bedtime.  also check if you have symptoms of your blood sugar being too high or too low.  please keep a record of the readings and bring it to your next appointment here (or you can bring the meter itself).  You can write it on any piece of paper.  please call us sooner if your blood sugar goes below 70, or if you have a lot of readings over 200.   Please change the insulins to the numbers listed below.   Please come back for a follow-up appointment in 3 months.    

## 2020-04-07 DIAGNOSIS — I5020 Unspecified systolic (congestive) heart failure: Secondary | ICD-10-CM | POA: Diagnosis not present

## 2020-04-14 ENCOUNTER — Other Ambulatory Visit: Payer: Self-pay

## 2020-04-14 ENCOUNTER — Encounter: Payer: Medicare Other | Attending: Endocrinology | Admitting: Nutrition

## 2020-04-14 DIAGNOSIS — E1149 Type 2 diabetes mellitus with other diabetic neurological complication: Secondary | ICD-10-CM

## 2020-04-15 NOTE — Progress Notes (Addendum)
Patient is here today to discuss his difficulties with the [place] sensor.  It does not last for 14 days, and falls off before the 2 weeks are up.  He was given a sample of skin tac to try on his skin to help as an added adhesive.  His phone also does not accept readings any longer from the [place].  He was given another sensor to start and we uninstalled and reinstalled  his Elenor Legato app to his phone to try.  Hopefully it will start working again in one hour after warmup.  He will let me know if this works. He also wanted information on diet/carbs.  We discussed the idea of the 3 major food groups, what foods are in each group and how they affect blood sugar readings.  He was strongly encouraged to have a meal that includes all 3 major food groups with not too many servings of each group.  He understood this and admits that he does not eat a certain times, some days eating at 7AM and other times skipping meals or not eating until 11AM.   He also eats a lot of packaged foods--frozen meals and fast food.  Discussed that these are high in sodium and fat.  We reviewed porion sizes of appropriate food groups for each meal, and was thankful for the information.   He called me in one hour and reported that his new sensor would not link to his phone.  I told him that once he runs out of [place] sensors, I will give him a sample of the Dexcom sensor and he can try this to see if it will stick longer and go to his phone.  He had no final questions.

## 2020-04-22 NOTE — Patient Instructions (Signed)
Call me when you are on your last [place] sensor, and I will give you a Dexcom sensor to try.

## 2020-05-28 DIAGNOSIS — Z4502 Encounter for adjustment and management of automatic implantable cardiac defibrillator: Secondary | ICD-10-CM | POA: Diagnosis not present

## 2020-06-15 DIAGNOSIS — Z794 Long term (current) use of insulin: Secondary | ICD-10-CM | POA: Diagnosis not present

## 2020-06-15 DIAGNOSIS — I5020 Unspecified systolic (congestive) heart failure: Secondary | ICD-10-CM | POA: Diagnosis not present

## 2020-06-15 DIAGNOSIS — Z6827 Body mass index (BMI) 27.0-27.9, adult: Secondary | ICD-10-CM | POA: Diagnosis not present

## 2020-06-15 DIAGNOSIS — E1159 Type 2 diabetes mellitus with other circulatory complications: Secondary | ICD-10-CM | POA: Diagnosis not present

## 2020-06-25 ENCOUNTER — Other Ambulatory Visit: Payer: Self-pay | Admitting: Endocrinology

## 2020-06-25 DIAGNOSIS — E0800 Diabetes mellitus due to underlying condition with hyperosmolarity without nonketotic hyperglycemic-hyperosmolar coma (NKHHC): Secondary | ICD-10-CM

## 2020-06-30 ENCOUNTER — Ambulatory Visit: Payer: Medicare Other | Admitting: Endocrinology

## 2020-06-30 ENCOUNTER — Telehealth: Payer: Self-pay | Admitting: Endocrinology

## 2020-06-30 NOTE — Telephone Encounter (Signed)
Confirm in provider's notes. Patient is only to inject admelog 3 times with each meals. Pharmacy have been notified.

## 2020-06-30 NOTE — Telephone Encounter (Signed)
[facility] called to clarify dosage instructions for pt's admelog solostar insulin. In the instructions it says inject with 3 meals daily (which is 3x a day) but also goes to say use 4 times a day and the pharmacy needs to know if it is either 3 x a day or 4 x a day.   Callback # [phone] ask for Barnett Applebaum

## 2020-07-07 ENCOUNTER — Other Ambulatory Visit: Payer: Self-pay | Admitting: Endocrinology

## 2020-07-07 DIAGNOSIS — E0842 Diabetes mellitus due to underlying condition with diabetic polyneuropathy: Secondary | ICD-10-CM

## 2020-07-17 DIAGNOSIS — D485 Neoplasm of uncertain behavior of skin: Secondary | ICD-10-CM | POA: Diagnosis not present

## 2020-07-17 DIAGNOSIS — L570 Actinic keratosis: Secondary | ICD-10-CM | POA: Diagnosis not present

## 2020-07-17 DIAGNOSIS — X32XXXD Exposure to sunlight, subsequent encounter: Secondary | ICD-10-CM | POA: Diagnosis not present

## 2020-07-17 DIAGNOSIS — D225 Melanocytic nevi of trunk: Secondary | ICD-10-CM | POA: Diagnosis not present

## 2020-07-28 NOTE — Telephone Encounter (Signed)
[name] with [name] requests to be called at ph# [phone] for clarification of dosage of  admelog solostar insulin (states 8 units 4 x per day).

## 2020-07-30 NOTE — Telephone Encounter (Signed)
Spoke with pharmacy rep to give clarifications with Admelog

## 2020-08-27 DIAGNOSIS — Z4502 Encounter for adjustment and management of automatic implantable cardiac defibrillator: Secondary | ICD-10-CM | POA: Diagnosis not present

## 2020-09-04 DIAGNOSIS — Z85828 Personal history of other malignant neoplasm of skin: Secondary | ICD-10-CM | POA: Diagnosis not present

## 2020-09-04 DIAGNOSIS — Z08 Encounter for follow-up examination after completed treatment for malignant neoplasm: Secondary | ICD-10-CM | POA: Diagnosis not present

## 2020-09-14 DIAGNOSIS — I5020 Unspecified systolic (congestive) heart failure: Secondary | ICD-10-CM | POA: Diagnosis not present

## 2020-09-14 DIAGNOSIS — Z6827 Body mass index (BMI) 27.0-27.9, adult: Secondary | ICD-10-CM | POA: Diagnosis not present

## 2020-09-14 DIAGNOSIS — E1159 Type 2 diabetes mellitus with other circulatory complications: Secondary | ICD-10-CM | POA: Diagnosis not present

## 2020-09-14 DIAGNOSIS — Z794 Long term (current) use of insulin: Secondary | ICD-10-CM | POA: Diagnosis not present

## 2020-10-07 ENCOUNTER — Other Ambulatory Visit: Payer: Self-pay | Admitting: Endocrinology

## 2020-10-08 ENCOUNTER — Other Ambulatory Visit: Payer: Self-pay

## 2020-10-08 ENCOUNTER — Telehealth: Payer: Self-pay | Admitting: Endocrinology

## 2020-10-08 DIAGNOSIS — E0842 Diabetes mellitus due to underlying condition with diabetic polyneuropathy: Secondary | ICD-10-CM

## 2020-10-08 MED ORDER — FREESTYLE LIBRE 14 DAY SENSOR MISC: 3 refills | Status: DC

## 2020-10-08 NOTE — Telephone Encounter (Signed)
Pt is needing a refill on Continuous Blood Gluc Sensor (FREESTYLE LIBRE 14 DAY SENSOR) MISC   [facility] - [place], [place] - [address]

## 2020-10-27 ENCOUNTER — Other Ambulatory Visit: Payer: Self-pay | Admitting: Endocrinology

## 2020-11-26 DIAGNOSIS — Z4502 Encounter for adjustment and management of automatic implantable cardiac defibrillator: Secondary | ICD-10-CM | POA: Diagnosis not present

## 2020-12-28 DIAGNOSIS — Z794 Long term (current) use of insulin: Secondary | ICD-10-CM | POA: Diagnosis not present

## 2020-12-28 DIAGNOSIS — I25118 Atherosclerotic heart disease of native coronary artery with other forms of angina pectoris: Secondary | ICD-10-CM | POA: Diagnosis not present

## 2020-12-28 DIAGNOSIS — I10 Essential (primary) hypertension: Secondary | ICD-10-CM | POA: Diagnosis not present

## 2020-12-28 DIAGNOSIS — I5020 Unspecified systolic (congestive) heart failure: Secondary | ICD-10-CM | POA: Diagnosis not present

## 2020-12-28 DIAGNOSIS — E785 Hyperlipidemia, unspecified: Secondary | ICD-10-CM | POA: Diagnosis not present

## 2020-12-28 DIAGNOSIS — E1159 Type 2 diabetes mellitus with other circulatory complications: Secondary | ICD-10-CM | POA: Diagnosis not present

## 2021-01-05 DIAGNOSIS — E119 Type 2 diabetes mellitus without complications: Secondary | ICD-10-CM | POA: Diagnosis not present

## 2021-01-05 DIAGNOSIS — I4819 Other persistent atrial fibrillation: Secondary | ICD-10-CM | POA: Diagnosis not present

## 2021-01-05 DIAGNOSIS — Z882 Allergy status to sulfonamides status: Secondary | ICD-10-CM | POA: Diagnosis not present

## 2021-01-05 DIAGNOSIS — I2510 Atherosclerotic heart disease of native coronary artery without angina pectoris: Secondary | ICD-10-CM | POA: Diagnosis not present

## 2021-01-05 DIAGNOSIS — I255 Ischemic cardiomyopathy: Secondary | ICD-10-CM | POA: Diagnosis not present

## 2021-01-05 DIAGNOSIS — Z7901 Long term (current) use of anticoagulants: Secondary | ICD-10-CM | POA: Diagnosis not present

## 2021-01-05 DIAGNOSIS — Z79899 Other long term (current) drug therapy: Secondary | ICD-10-CM | POA: Diagnosis not present

## 2021-01-05 DIAGNOSIS — Z7984 Long term (current) use of oral hypoglycemic drugs: Secondary | ICD-10-CM | POA: Diagnosis not present

## 2021-01-05 DIAGNOSIS — E785 Hyperlipidemia, unspecified: Secondary | ICD-10-CM | POA: Diagnosis not present

## 2021-01-05 DIAGNOSIS — Z794 Long term (current) use of insulin: Secondary | ICD-10-CM | POA: Diagnosis not present

## 2021-01-05 DIAGNOSIS — Z6826 Body mass index (BMI) 26.0-26.9, adult: Secondary | ICD-10-CM | POA: Diagnosis not present

## 2021-01-05 DIAGNOSIS — I110 Hypertensive heart disease with heart failure: Secondary | ICD-10-CM | POA: Diagnosis not present

## 2021-01-05 DIAGNOSIS — I5022 Chronic systolic (congestive) heart failure: Secondary | ICD-10-CM | POA: Diagnosis not present

## 2021-01-05 DIAGNOSIS — I4891 Unspecified atrial fibrillation: Secondary | ICD-10-CM | POA: Diagnosis not present

## 2021-01-05 DIAGNOSIS — Z950 Presence of cardiac pacemaker: Secondary | ICD-10-CM | POA: Diagnosis not present

## 2021-01-05 DIAGNOSIS — R9431 Abnormal electrocardiogram [ECG] [EKG]: Secondary | ICD-10-CM | POA: Diagnosis not present

## 2021-01-06 DIAGNOSIS — I4891 Unspecified atrial fibrillation: Secondary | ICD-10-CM | POA: Diagnosis not present

## 2021-01-11 DIAGNOSIS — E1140 Type 2 diabetes mellitus with diabetic neuropathy, unspecified: Secondary | ICD-10-CM | POA: Diagnosis not present

## 2021-01-11 DIAGNOSIS — B351 Tinea unguium: Secondary | ICD-10-CM | POA: Diagnosis not present

## 2021-01-11 DIAGNOSIS — Z89439 Acquired absence of unspecified foot: Secondary | ICD-10-CM | POA: Diagnosis not present

## 2021-01-11 DIAGNOSIS — L851 Acquired keratosis [keratoderma] palmaris et plantaris: Secondary | ICD-10-CM | POA: Diagnosis not present

## 2021-01-14 DIAGNOSIS — I5042 Chronic combined systolic (congestive) and diastolic (congestive) heart failure: Secondary | ICD-10-CM | POA: Diagnosis not present

## 2021-01-14 DIAGNOSIS — Z4509 Encounter for adjustment and management of other cardiac device: Secondary | ICD-10-CM | POA: Diagnosis not present

## 2021-02-04 DIAGNOSIS — Z4502 Encounter for adjustment and management of automatic implantable cardiac defibrillator: Secondary | ICD-10-CM | POA: Diagnosis not present

## 2021-02-04 DIAGNOSIS — I4891 Unspecified atrial fibrillation: Secondary | ICD-10-CM | POA: Diagnosis not present

## 2021-02-17 ENCOUNTER — Telehealth: Payer: Self-pay | Admitting: Endocrinology

## 2021-02-17 DIAGNOSIS — I5042 Chronic combined systolic (congestive) and diastolic (congestive) heart failure: Secondary | ICD-10-CM | POA: Diagnosis not present

## 2021-02-17 DIAGNOSIS — E0842 Diabetes mellitus due to underlying condition with diabetic polyneuropathy: Secondary | ICD-10-CM

## 2021-02-17 MED ORDER — FREESTYLE LIBRE 14 DAY SENSOR MISC: 3 refills | Status: DC

## 2021-02-17 NOTE — Telephone Encounter (Signed)
Patient called to requested a new Free Style Libre 14 Day Reader. His is no longer reading.    Please sed a replacement reader to [facility] on [address].   Call patient at [phone] for any questions or clarification.

## 2021-02-17 NOTE — Telephone Encounter (Signed)
RX now sent in.

## 2021-02-18 MED ORDER — FREESTYLE LIBRE 2 READER DEVI: 0 refills | Status: DC

## 2021-02-18 MED ORDER — FREESTYLE LIBRE 2 SENSOR MISC: 3 refills | Status: DC

## 2021-02-18 NOTE — Telephone Encounter (Signed)
[place] with [facility] called re: They received Rx's for the Freestyle 14 Sensors, etc, however, The Freestyle 14 day has been discontinued. PHARM requests new RX's for the following:  MEDICATION: Freestyle Libre 2 Reader  And [facility] [place] 2 Sensors  PHARMACY:   [facility] - [place], [place] - [address] Phone:  [phone]  Fax:  [phone]      HAS THE PATIENT CONTACTED [facility]?    IS THIS A 90 DAY SUPPLY : ?  IS PATIENT OUT OF MEDICATION:   IF NOT; HOW MUCH IS LEFT: ?  LAST APPOINTMENT DATE: @8 /30/2022  NEXT APPOINTMENT DATE:@1 /11/2021  DO WE HAVE YOUR PERMISSION TO LEAVE A DETAILED MESSAGE?:?  OTHER COMMENTS:    **Let patient know to contact pharmacy at the end of the day to make sure medication is ready. **  ** Please notify patient to allow 48-72 hours to process**  **Encourage patient to contact the pharmacy for refills or they can request refills through [facility]**

## 2021-02-18 NOTE — Telephone Encounter (Signed)
New RX for [employer] 2 sensor and receiver has now been sent to [facility].

## 2021-02-18 NOTE — Addendum Note (Signed)
Addended by: Sarina Ill on: 02/18/2021 11:43 AM   Modules accepted: Orders

## 2021-02-25 DIAGNOSIS — Z4502 Encounter for adjustment and management of automatic implantable cardiac defibrillator: Secondary | ICD-10-CM | POA: Diagnosis not present

## 2021-03-05 DIAGNOSIS — D225 Melanocytic nevi of trunk: Secondary | ICD-10-CM | POA: Diagnosis not present

## 2021-03-05 DIAGNOSIS — L570 Actinic keratosis: Secondary | ICD-10-CM | POA: Diagnosis not present

## 2021-03-05 DIAGNOSIS — Z08 Encounter for follow-up examination after completed treatment for malignant neoplasm: Secondary | ICD-10-CM | POA: Diagnosis not present

## 2021-03-05 DIAGNOSIS — Z85828 Personal history of other malignant neoplasm of skin: Secondary | ICD-10-CM | POA: Diagnosis not present

## 2021-03-09 ENCOUNTER — Ambulatory Visit: Payer: Medicare Other | Admitting: Endocrinology

## 2021-03-09 ENCOUNTER — Other Ambulatory Visit: Payer: Self-pay

## 2021-03-09 VITALS — BP 118/76 | HR 81 | Ht 70.0 in | Wt 195.2 lb

## 2021-03-09 DIAGNOSIS — E1142 Type 2 diabetes mellitus with diabetic polyneuropathy: Secondary | ICD-10-CM | POA: Diagnosis not present

## 2021-03-09 DIAGNOSIS — E0800 Diabetes mellitus due to underlying condition with hyperosmolarity without nonketotic hyperglycemic-hyperosmolar coma (NKHHC): Secondary | ICD-10-CM

## 2021-03-09 DIAGNOSIS — E0842 Diabetes mellitus due to underlying condition with diabetic polyneuropathy: Secondary | ICD-10-CM

## 2021-03-09 LAB — POCT GLYCOSYLATED HEMOGLOBIN (HGB A1C): Hemoglobin A1C: 6.9 % — AB (ref 4.0–5.6)

## 2021-03-09 MED ORDER — INSULIN LISPRO (1 UNIT DIAL) 100 UNIT/ML (KWIKPEN): 11.0000 [IU] | PEN_INJECTOR | Freq: Three times a day (TID) | SUBCUTANEOUS | 3 refills | Status: DC

## 2021-03-09 MED ORDER — LANTUS SOLOSTAR 100 UNIT/ML ~~LOC~~ SOPN: 10.0000 [IU] | PEN_INJECTOR | Freq: Every day | SUBCUTANEOUS | 11 refills | Status: DC

## 2021-03-09 NOTE — Progress Notes (Signed)
Subjective:    Patient ID: [name], male    DOB: [birth year], 76 y.o.   MRN: [id number]  HPI Pt returns for f/u of diabetes mellitus: DM type: Insulin-requiring type 2.   Dx'ed: 1478 Complications: right TMA, PN, CRI, and CAD.  Therapy: insulin since mid-2018, and Victoza.   DKA: never Severe hypoglycemia: never.   Pancreatitis: never.  Pancreatic imaging: never Other: He did not tolerate metformin (diarrhea); he declines V-GO, due to cost.  He takes multiple daily injections.  Interval history: I reviewed continuous glucose monitor data.  Glucose varies from 90-280.  It is in general highest at [place], and lowest at 1AM-3AM.  However, there is little trend throughout the day.  He seldom has hypoglycemia, and these episodes are mild.  This happens with exertion.  He says he takes insulins as rx'ed.    Past Medical History:  Diagnosis Date   CALLUS, RIGHT FOOT 12/08/2009   Cardiac arrest [facility])    Chronic systolic heart failure ([place]) 11/03/2009   DIABETES MELLITUS, TYPE II 12/05/2006   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/2009   HYPERLIPIDEMIA 11/25/2006   HYPOGONADISM, MALE 12/05/2006   Implantable cardiac defibrillator MDT 09/22/2008   Change out feb 2011, 2015   Ischemic cardiomyopathy 09/22/2008   DES CX and RCA  70% residual LAD  Done in [place]; EF 30%;; myoviewq 05/31/2006 no ischemia   Kidney stones    Long term (current) use of anticoagulants 04/25/2010   Myocardial infarction [facility]) 2004/05/30   "I died and they brought me back"   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   NEPHROLITHIASIS, HX OF 02/25/2008   Persistent atrial fibrillation ([place]) 11/25/2006   Inapp shock 31-May-2010 AF VR >220 recurrent    Past Surgical History:  Procedure Laterality Date   CARDIAC CATHETERIZATION     feburary 05-30-2012   CARDIAC DEFIBRILLATOR PLACEMENT     Guidant Vitality [name]   CARDIOVERSION N/A 04/25/2012   Procedure: CARDIOVERSION;  Surgeon: Darlin Coco, MD;  Location: [facility] ENDOSCOPY;  Service:  Cardiovascular;  Laterality: N/A;   CARDIOVERSION N/A 05/21/2012   Procedure: CARDIOVERSION;  Surgeon: Deboraha Sprang, MD;  Location: [place];  Service: Cardiovascular;  Laterality: N/A;   CATARACT EXTRACTION     EYE SURGERY     cataracts   IMPLANTABLE CARDIOVERTER DEFIBRILLATOR (ICD) GENERATOR CHANGE N/A 02/05/2014   Procedure: ICD GENERATOR CHANGE;  Surgeon: Deboraha Sprang, MD;  Location: [facility] CATH LAB;  Service: Cardiovascular;  Laterality: N/A;   PTCA     TONSILLECTOMY     TONSILLECTOMY     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   Procedure: TRANSMETATARSAL AMPUTATION RIGHT;  Surgeon: Marty Heck, MD;  Location[facility];  Service: Vascular;  Laterality: Right;    Social History   Socioeconomic History   Marital status: Divorced    Spouse name: Not on file   Number of children: 1   Years of education: 16   Highest education level: Not on file  Occupational History   Occupation: Education officer, museum business and show production    Employer: [employer]  Tobacco Use   Smoking status: Never   Smokeless tobacco: Never  Vaping Use   Vaping Use: Never used  Substance and Sexual Activity   Alcohol use: Yes    Alcohol/week: 0.0 standard drinks    Comment: summer    Drug use: No   Sexual activity: Yes    Partners: Female  Other Topics Concern   Not  on file  Social History Narrative   HSG, College grad. Married - divorced. 1 daughter. owner/operator Research officer, trade union business and show production   Social Determinants of Radio broadcast assistant Strain: Not on file  Food Insecurity: Not on file  Transportation Needs: Not on file  Physical Activity: Not on file  Stress: Not on file  Social Connections: Not on file  Intimate Partner Violence: Not on file    Current Outpatient Medications on File Prior to Visit  Medication Sig Dispense Refill   aspirin EC 81 MG tablet Take 81 mg by mouth daily. Swallow whole.     citalopram (CELEXA) 10 MG tablet  TAKE 1 TABLET BY MOUTH EVERY DAY (Patient taking differently: Take 10 mg by mouth daily.) 90 tablet 3   Continuous Blood Gluc Receiver (FREESTYLE LIBRE 14 DAY READER) DEVI USE AS DIRECTED 1 each 0   Continuous Blood Gluc Receiver (FREESTYLE LIBRE 2 READER) DEVI Use as instructure to check blood sugar 1 each 0   Continuous Blood Gluc Sensor (FREESTYLE LIBRE 2 SENSOR) MISC Apply new sensore every 14 days 6 each 3   dofetilide (TIKOSYN) 500 MCG capsule TAKE 1 CAPSULE BY MOUTH 2 TIMES DAILY (Patient taking differently: Take 500 mcg by mouth 2 (two) times daily.) 60 capsule 0   eplerenone (INSPRA) 25 MG tablet Take 25 mg by mouth daily.     fluticasone (FLONASE) 50 MCG/ACT nasal spray USE 2 SPRAYS IN EACH NOSTRIL EVERY DAY (Patient taking differently: Place 2 sprays into both nostrils daily.) 16 g 5   Glucose Blood (FREESTYLE PRECISION NEO TEST VI) by In Vitro route.     glucose blood (ONE TOUCH ULTRA TEST) test strip 1 each by Other route 2 (two) times daily. 200 each 2   Lancets MISC Use as directed up to 4 times per day 100 each 11   liraglutide (VICTOZA) 18 MG/3ML SOPN Inject 1.8 mg into the skin every evening.     loratadine (CLARITIN) 10 MG tablet Take 10 mg by mouth daily.     LORazepam (ATIVAN) 1 MG tablet TAKE 1/2 TABLET BY MOUTH EVERY DAY AS NEEDED FOR ANXIETY (Patient taking differently: Take 0.5 mg by mouth 2 (two) times daily as needed for anxiety or sleep.) 45 tablet 1   Magnesium Oxide (MAG-OX 400 PO) Take 400 mg by mouth 2 (two) times daily.     metoprolol (TOPROL-XL) 200 MG 24 hr tablet Take 200 mg by mouth daily.     Multiple Vitamins-Minerals (MULTIVITAMIN PO) Take 1 tablet by mouth daily.     NITROSTAT 0.4 MG SL tablet Place 1 tablet (0.4 mg total) under the tongue every 5 (five) minutes as needed for chest pain. 25 tablet 5   rosuvastatin (CRESTOR) 20 MG tablet TAKE 1 TABLET BY MOUTH EVERY DAY (Patient taking differently: Take 20 mg by mouth every evening.) 90 tablet 0   ULTICARE  MICRO PEN NEEDLES 32G X 4 MM MISC USE DAILY AS DIRECTED 100 each 3   XARELTO 20 MG TABS tablet TAKE 1 TABLET BY MOUTH EVERY DAY (Patient taking differently: Take 20 mg by mouth daily with supper.) 30 tablet 5   zolpidem (AMBIEN) 5 MG tablet TAKE 1 OR 2 TABLETS BY MOUTH NIGHTLY AT BEDTIME AS NEEDED (Patient taking differently: Take 10 mg by mouth at bedtime as needed for sleep.) 30 tablet 0   No current facility-administered medications on file prior to visit.    Allergies  Allergen Reactions   [place] [Fish Allergy] Other (  See Comments)    Only canned/ not fresh (probably preservatives).    Sulfa Antibiotics     Not sure has allergyu to this Only canned/ not fresh (probably preservatives).    Metformin And Related Other (See Comments)    diarrhea    Family History  Problem Relation Age of Onset   Diabetes Maternal Grandmother    Hyperlipidemia Maternal Grandmother    Breast cancer Mother    Cancer Neg Hx    COPD Neg Hx    Heart disease Neg Hx     BP 118/76    Pulse 81    Ht 5\' 10"  (1.778 m)    Wt 195 lb 3.2 oz (88.5 kg)    SpO2 95%    BMI 28.01 kg/m    Review of Systems     Objective:   Physical Exam   Lab Results  Component Value Date   HGBA1C 6.9 (A) 03/09/2021      Assessment & Plan:  Insulin-requiring type 2 DM: Hypoglycemia, due to insulin: The pattern of his cbg's indicates he needs some adjustment in his therapy.   Patient Instructions  check your blood sugar twice a day.  vary the time of day when you check, between before the 3 meals, and at bedtime.  also check if you have symptoms of your blood sugar being too high or too low.  please keep a record of the readings and bring it to your next appointment here (or you can bring the meter itself).  You can write it on any piece of paper.  please call us sooner if your blood sugar goes below 70, or if you have a lot of readings over 200.   Please change the insulins to the numbers listed below.   However, take  just 6 units of lispro if exertion is upcoming. Please come back for a follow-up appointment in 3 months.

## 2021-03-09 NOTE — Patient Instructions (Addendum)
check your blood sugar twice a day.  vary the time of day when you check, between before the 3 meals, and at bedtime.  also check if you have symptoms of your blood sugar being too high or too low.  please keep a record of the readings and bring it to your next appointment here (or you can bring the meter itself).  You can write it on any piece of paper.  please call us sooner if your blood sugar goes below 70, or if you have a lot of readings over 200.   Please change the insulins to the numbers listed below.   However, take just 6 units of lispro if exertion is upcoming. Please come back for a follow-up appointment in 3 months.

## 2021-03-18 DIAGNOSIS — I5022 Chronic systolic (congestive) heart failure: Secondary | ICD-10-CM | POA: Diagnosis not present

## 2021-03-18 DIAGNOSIS — I5020 Unspecified systolic (congestive) heart failure: Secondary | ICD-10-CM | POA: Diagnosis not present

## 2021-03-18 DIAGNOSIS — Z794 Long term (current) use of insulin: Secondary | ICD-10-CM | POA: Diagnosis not present

## 2021-03-18 DIAGNOSIS — Z950 Presence of cardiac pacemaker: Secondary | ICD-10-CM | POA: Diagnosis not present

## 2021-03-18 DIAGNOSIS — Z4502 Encounter for adjustment and management of automatic implantable cardiac defibrillator: Secondary | ICD-10-CM | POA: Diagnosis not present

## 2021-03-18 DIAGNOSIS — I110 Hypertensive heart disease with heart failure: Secondary | ICD-10-CM | POA: Diagnosis not present

## 2021-03-18 DIAGNOSIS — Z882 Allergy status to sulfonamides status: Secondary | ICD-10-CM | POA: Diagnosis not present

## 2021-03-18 DIAGNOSIS — I472 Ventricular tachycardia, unspecified: Secondary | ICD-10-CM | POA: Diagnosis not present

## 2021-03-18 DIAGNOSIS — E785 Hyperlipidemia, unspecified: Secondary | ICD-10-CM | POA: Diagnosis not present

## 2021-03-18 DIAGNOSIS — I2510 Atherosclerotic heart disease of native coronary artery without angina pectoris: Secondary | ICD-10-CM | POA: Diagnosis not present

## 2021-03-18 DIAGNOSIS — I4901 Ventricular fibrillation: Secondary | ICD-10-CM | POA: Diagnosis not present

## 2021-03-18 DIAGNOSIS — E119 Type 2 diabetes mellitus without complications: Secondary | ICD-10-CM | POA: Diagnosis not present

## 2021-03-18 DIAGNOSIS — Z6827 Body mass index (BMI) 27.0-27.9, adult: Secondary | ICD-10-CM | POA: Diagnosis not present

## 2021-03-18 DIAGNOSIS — I493 Ventricular premature depolarization: Secondary | ICD-10-CM | POA: Diagnosis not present

## 2021-03-18 DIAGNOSIS — I4819 Other persistent atrial fibrillation: Secondary | ICD-10-CM | POA: Diagnosis not present

## 2021-03-18 DIAGNOSIS — H9319 Tinnitus, unspecified ear: Secondary | ICD-10-CM | POA: Diagnosis not present

## 2021-03-18 DIAGNOSIS — Z7982 Long term (current) use of aspirin: Secondary | ICD-10-CM | POA: Diagnosis not present

## 2021-03-18 DIAGNOSIS — I255 Ischemic cardiomyopathy: Secondary | ICD-10-CM | POA: Diagnosis not present

## 2021-03-18 DIAGNOSIS — Z7984 Long term (current) use of oral hypoglycemic drugs: Secondary | ICD-10-CM | POA: Diagnosis not present

## 2021-03-19 DIAGNOSIS — I4819 Other persistent atrial fibrillation: Secondary | ICD-10-CM | POA: Diagnosis not present

## 2021-03-22 DIAGNOSIS — B351 Tinea unguium: Secondary | ICD-10-CM | POA: Diagnosis not present

## 2021-03-22 DIAGNOSIS — E1140 Type 2 diabetes mellitus with diabetic neuropathy, unspecified: Secondary | ICD-10-CM | POA: Diagnosis not present

## 2021-03-22 DIAGNOSIS — L851 Acquired keratosis [keratoderma] palmaris et plantaris: Secondary | ICD-10-CM | POA: Diagnosis not present

## 2021-03-25 ENCOUNTER — Encounter: Payer: Self-pay | Admitting: Endocrinology

## 2021-04-16 DIAGNOSIS — L570 Actinic keratosis: Secondary | ICD-10-CM | POA: Diagnosis not present

## 2021-04-16 DIAGNOSIS — X32XXXD Exposure to sunlight, subsequent encounter: Secondary | ICD-10-CM | POA: Diagnosis not present

## 2021-04-16 DIAGNOSIS — Z85828 Personal history of other malignant neoplasm of skin: Secondary | ICD-10-CM | POA: Diagnosis not present

## 2021-04-16 DIAGNOSIS — Z08 Encounter for follow-up examination after completed treatment for malignant neoplasm: Secondary | ICD-10-CM | POA: Diagnosis not present

## 2021-04-26 DIAGNOSIS — E1159 Type 2 diabetes mellitus with other circulatory complications: Secondary | ICD-10-CM | POA: Diagnosis not present

## 2021-04-26 DIAGNOSIS — I10 Essential (primary) hypertension: Secondary | ICD-10-CM | POA: Diagnosis not present

## 2021-04-26 DIAGNOSIS — I4819 Other persistent atrial fibrillation: Secondary | ICD-10-CM | POA: Diagnosis not present

## 2021-04-26 DIAGNOSIS — I25118 Atherosclerotic heart disease of native coronary artery with other forms of angina pectoris: Secondary | ICD-10-CM | POA: Diagnosis not present

## 2021-04-26 DIAGNOSIS — Z794 Long term (current) use of insulin: Secondary | ICD-10-CM | POA: Diagnosis not present

## 2021-04-26 DIAGNOSIS — I5020 Unspecified systolic (congestive) heart failure: Secondary | ICD-10-CM | POA: Diagnosis not present

## 2021-04-30 DIAGNOSIS — Z4502 Encounter for adjustment and management of automatic implantable cardiac defibrillator: Secondary | ICD-10-CM | POA: Diagnosis not present

## 2021-04-30 DIAGNOSIS — I5042 Chronic combined systolic (congestive) and diastolic (congestive) heart failure: Secondary | ICD-10-CM | POA: Diagnosis not present

## 2021-05-27 DIAGNOSIS — Z4502 Encounter for adjustment and management of automatic implantable cardiac defibrillator: Secondary | ICD-10-CM | POA: Diagnosis not present

## 2021-05-31 DIAGNOSIS — E1140 Type 2 diabetes mellitus with diabetic neuropathy, unspecified: Secondary | ICD-10-CM | POA: Diagnosis not present

## 2021-05-31 DIAGNOSIS — L851 Acquired keratosis [keratoderma] palmaris et plantaris: Secondary | ICD-10-CM | POA: Diagnosis not present

## 2021-05-31 DIAGNOSIS — B351 Tinea unguium: Secondary | ICD-10-CM | POA: Diagnosis not present

## 2021-05-31 DIAGNOSIS — I5042 Chronic combined systolic (congestive) and diastolic (congestive) heart failure: Secondary | ICD-10-CM | POA: Diagnosis not present

## 2021-06-15 ENCOUNTER — Encounter: Payer: Self-pay | Admitting: Endocrinology

## 2021-06-15 ENCOUNTER — Ambulatory Visit (INDEPENDENT_AMBULATORY_CARE_PROVIDER_SITE_OTHER): Payer: Medicare Other | Admitting: Endocrinology

## 2021-06-15 VITALS — BP 100/74 | HR 74 | Ht 70.0 in | Wt 192.4 lb

## 2021-06-15 DIAGNOSIS — E1142 Type 2 diabetes mellitus with diabetic polyneuropathy: Secondary | ICD-10-CM

## 2021-06-15 DIAGNOSIS — E1159 Type 2 diabetes mellitus with other circulatory complications: Secondary | ICD-10-CM | POA: Diagnosis not present

## 2021-06-15 DIAGNOSIS — E0800 Diabetes mellitus due to underlying condition with hyperosmolarity without nonketotic hyperglycemic-hyperosmolar coma (NKHHC): Secondary | ICD-10-CM

## 2021-06-15 DIAGNOSIS — Z794 Long term (current) use of insulin: Secondary | ICD-10-CM | POA: Diagnosis not present

## 2021-06-15 DIAGNOSIS — E0842 Diabetes mellitus due to underlying condition with diabetic polyneuropathy: Secondary | ICD-10-CM

## 2021-06-15 LAB — POCT GLYCOSYLATED HEMOGLOBIN (HGB A1C): Hemoglobin A1C: 7.1 % — AB (ref 4.0–5.6)

## 2021-06-15 MED ORDER — INSULIN LISPRO (1 UNIT DIAL) 100 UNIT/ML (KWIKPEN): PEN_INJECTOR | SUBCUTANEOUS | 3 refills | Status: DC

## 2021-06-15 NOTE — Progress Notes (Signed)
? ?Subjective:  ? ? Patient ID: [name], male    DOB: 1945-07-27, 76 y.o.   MRN: [id number] ? ?HPI ?Pt returns for f/u of diabetes mellitus:  ?DM type: Insulin-requiring type 2.   ?Dx'ed: Jun 08, 2006 ?Complications: right TMA, PN, CRI, and CAD.   ?Therapy: insulin since mid-2018, and Victoza.   ?DKA: never ?Severe hypoglycemia: never.   ?Pancreatitis: never.  ?Pancreatic imaging: never ?Other: He did not tolerate metformin (diarrhea); he declines V-GO, due to cost.  He takes multiple daily injections.  ?Interval history: I reviewed continuous glucose monitor data.  Glucose varies from 100-330.  It is in general highest at 4-5PM, and lowest at 7AM.  It declines slightly overnight.  There is little trend throughout the day.  He seldom has hypoglycemia, and these episodes are mild.  This happens at HS.  He says he takes insulins as rx'ed.   ?Past Medical History:  ?Diagnosis Date  ? CALLUS, RIGHT FOOT 12/08/2009  ? Cardiac arrest [facility])   ? Chronic systolic heart failure ([place]) 11/03/2009  ? DIABETES MELLITUS, TYPE II 12/05/2006  ? 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/2009  ? HYPERLIPIDEMIA 11/25/2006  ? HYPOGONADISM, MALE 12/05/2006  ? Implantable cardiac defibrillator MDT 09/22/2008  ? Change out feb 2011, 2015  ? Ischemic cardiomyopathy 09/22/2008  ? DES CX and RCA  70% residual LAD  Done in [place]; EF 30%;; myoviewq 2006-06-08 no ischemia  ? Kidney stones   ? Long term (current) use of anticoagulants 04/25/2010  ? Myocardial infarction [facility]) 2004/06/07  ? "I died and they brought me back"  ? 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 NEPHROLITHIASIS, HX OF 02/25/2008  ? Persistent atrial fibrillation ([place]) 11/25/2006  ? Inapp shock 2010-06-08 AF VR >220 recurrent  ? ? ?Past Surgical History:  ?Procedure Laterality Date  ? CARDIAC CATHETERIZATION    ? feburary 06/07/2012  ? CARDIAC DEFIBRILLATOR PLACEMENT    ? Guidant Vitality [name]  ? CARDIOVERSION N/A 04/25/2012  ? Procedure: CARDIOVERSION;  Surgeon: Darlin Coco, MD;  Location: [place];   Service: Cardiovascular;  Laterality: N/A;  ? CARDIOVERSION N/A 05/21/2012  ? Procedure: CARDIOVERSION;  Surgeon: Deboraha Sprang, MD;  Location: [place];  Service: Cardiovascular;  Laterality: N/A;  ? CATARACT EXTRACTION    ? EYE SURGERY    ? cataracts  ? IMPLANTABLE CARDIOVERTER DEFIBRILLATOR (ICD) GENERATOR CHANGE N/A 02/05/2014  ? Procedure: ICD GENERATOR CHANGE;  Surgeon: Deboraha Sprang, MD;  Location: [facility] CATH LAB;  Service: Cardiovascular;  Laterality: N/A;  ? PTCA    ? TONSILLECTOMY    ? TONSILLECTOMY    ? 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  ? Procedure: TRANSMETATARSAL AMPUTATION RIGHT;  Surgeon: Marty Heck, MD;  Location: [place];  Service: Vascular;  Laterality: Right;  ? ? ?Social History  ? ?Socioeconomic History  ? Marital status: Divorced  ?  Spouse name: Not on file  ? Number of children: 1  ? Years of education: 28  ? Highest education level: Not on file  ?Occupational History  ? Occupation: Education officer, museum business and show production  ?  Employer: [employer]  ?Tobacco Use  ? Smoking status: Never  ? Smokeless tobacco: Never  ?Vaping Use  ? Vaping Use: Never used  ?Substance and Sexual Activity  ? Alcohol use: Yes  ?  Alcohol/week: 0.0 standard drinks  ?  Comment: summer   ? Drug use: No  ? Sexual activity: Yes  ?  Partners: Female  ?Other  Topics Concern  ? Not on file  ?Social History Narrative  ? HSG, College grad. Married - divorced. 1 daughter. owner/operator Research officer, trade union business and show production  ? ?Social Determinants of Health  ? ?Financial Resource Strain: Not on file  ?Food Insecurity: Not on file  ?Transportation Needs: Not on file  ?Physical Activity: Not on file  ?Stress: Not on file  ?Social Connections: Not on file  ?Intimate Partner Violence: Not on file  ? ? ?Current Outpatient Medications on File Prior to Visit  ?Medication Sig Dispense Refill  ? aspirin EC 81 MG tablet Take 81 mg by mouth daily. Swallow whole.    ? citalopram (CELEXA) 10 MG  tablet TAKE 1 TABLET BY MOUTH EVERY DAY (Patient taking differently: Take 10 mg by mouth daily.) 90 tablet 3  ? Continuous Blood Gluc Receiver (FREESTYLE LIBRE 14 DAY READER) DEVI USE AS DIRECTED 1 each 0  ? Continuous Blood Gluc Receiver (FREESTYLE LIBRE 2 READER) DEVI Use as instructure to check blood sugar 1 each 0  ? Continuous Blood Gluc Sensor (FREESTYLE LIBRE 2 SENSOR) MISC Apply new sensore every 14 days 6 each 3  ? dofetilide (TIKOSYN) 500 MCG capsule TAKE 1 CAPSULE BY MOUTH 2 TIMES DAILY (Patient taking differently: Take 500 mcg by mouth 2 (two) times daily.) 60 capsule 0  ? eplerenone (INSPRA) 25 MG tablet Take 25 mg by mouth daily.    ? fluticasone (FLONASE) 50 MCG/ACT nasal spray USE 2 SPRAYS IN EACH NOSTRIL EVERY DAY (Patient taking differently: Place 2 sprays into both nostrils daily.) 16 g 5  ? Glucose Blood (FREESTYLE PRECISION NEO TEST VI) by In Vitro route.    ? glucose blood (ONE TOUCH ULTRA TEST) test strip 1 each by Other route 2 (two) times daily. 200 each 2  ? insulin glargine (LANTUS SOLOSTAR) 100 UNIT/ML Solostar Pen Inject 10 Units into the skin daily. 15 mL 11  ? Lancets MISC Use as directed up to 4 times per day 100 each 11  ? liraglutide (VICTOZA) 18 MG/3ML SOPN Inject 1.8 mg into the skin every evening.    ? loratadine (CLARITIN) 10 MG tablet Take 10 mg by mouth daily.    ? LORazepam (ATIVAN) 1 MG tablet TAKE 1/2 TABLET BY MOUTH EVERY DAY AS NEEDED FOR ANXIETY (Patient taking differently: Take 0.5 mg by mouth 2 (two) times daily as needed for anxiety or sleep.) 45 tablet 1  ? Magnesium Oxide (MAG-OX 400 PO) Take 400 mg by mouth 2 (two) times daily.    ? metoprolol (TOPROL-XL) 200 MG 24 hr tablet Take 200 mg by mouth daily.    ? Multiple Vitamins-Minerals (MULTIVITAMIN PO) Take 1 tablet by mouth daily.    ? NITROSTAT 0.4 MG SL tablet Place 1 tablet (0.4 mg total) under the tongue every 5 (five) minutes as needed for chest pain. 25 tablet 5  ? rosuvastatin (CRESTOR) 20 MG tablet TAKE 1  TABLET BY MOUTH EVERY DAY (Patient taking differently: Take 20 mg by mouth every evening.) 90 tablet 0  ? ULTICARE MICRO PEN NEEDLES 32G X 4 MM MISC USE DAILY AS DIRECTED 100 each 3  ? XARELTO 20 MG TABS tablet TAKE 1 TABLET BY MOUTH EVERY DAY (Patient taking differently: Take 20 mg by mouth daily with supper.) 30 tablet 5  ? zolpidem (AMBIEN) 5 MG tablet TAKE 1 OR 2 TABLETS BY MOUTH NIGHTLY AT BEDTIME AS NEEDED (Patient taking differently: Take 10 mg by mouth at bedtime as needed for sleep.) 30 tablet  0  ? ?No current facility-administered medications on file prior to visit.  ? ? ?Allergies  ?Allergen Reactions  ? Dani [place] [Fish Allergy] Other (See Comments)  ?  Only canned/ not fresh (probably preservatives).   ? Sulfa Antibiotics   ?  Not sure has allergyu to this ?Only canned/ not fresh (probably preservatives).   ? Metformin And Related Other (See Comments)  ?  diarrhea  ? ? ?Family History  ?Problem Relation Age of Onset  ? Diabetes Maternal Grandmother   ? Hyperlipidemia Maternal Grandmother   ? Breast cancer Mother   ? Cancer Neg Hx   ? COPD Neg Hx   ? Heart disease Neg Hx   ? ? ?BP 100/74 (BP Location: Left Arm, Patient Position: Sitting, Cuff Size: Normal)   Pulse 74   Ht '5\' 10"'$  (1.778 m)   Wt 192 lb 6.4 oz (87.3 kg)   SpO2 97%   BMI 27.61 kg/m?  ? ? ?Review of Systems ? ?   ?Objective:  ? Physical Exam ?VITAL SIGNS:  See vs page.   ?GENERAL: no distress.  ? ?Lab Results  ?Component Value Date  ? CREATININE 1.36 (H) 02/14/2020  ? BUN 17 02/14/2020  ? NA 133 (L) 02/14/2020  ? K 4.2 02/14/2020  ? CL 101 02/14/2020  ? CO2 25 02/14/2020  ? ? ?Lab Results  ?Component Value Date  ? HGBA1C 7.1 (A) 06/15/2021  ? ?   ?Assessment & Plan:  ?Insulin-requiring type 2 DM ?Hypoglycemia, due to insulin: this limits aggressiveness of glycemic control ? ? ?Patient Instructions  ?check your blood sugar twice a day.  vary the time of day when you check, between before the 3 meals, and at bedtime.  also check if you have  symptoms of your blood sugar being too high or too low.  please keep a record of the readings and bring it to your next appointment here (or you can bring the meter itself).  You can write it on any piece of

## 2021-06-15 NOTE — Patient Instructions (Addendum)
check your blood sugar twice a day.  vary the time of day when you check, between before the 3 meals, and at bedtime.  also check if you have symptoms of your blood sugar being too high or too low.  please keep a record of the readings and bring it to your next appointment here (or you can bring the meter itself).  You can write it on any piece of paper.  please call us sooner if your blood sugar goes below 70, or if you have a lot of readings over 200.   ?Please reduce the supper lispro to 10 units.  however, take just 6 units of if exertion is upcoming.   ?You should have an endocrinology follow-up appointment in 3-4 months.   ? ?

## 2021-07-05 DIAGNOSIS — Z4509 Encounter for adjustment and management of other cardiac device: Secondary | ICD-10-CM | POA: Diagnosis not present

## 2021-07-05 DIAGNOSIS — I5042 Chronic combined systolic (congestive) and diastolic (congestive) heart failure: Secondary | ICD-10-CM | POA: Diagnosis not present

## 2021-08-09 ENCOUNTER — Ambulatory Visit (INDEPENDENT_AMBULATORY_CARE_PROVIDER_SITE_OTHER): Payer: Medicare Other | Admitting: Internal Medicine

## 2021-08-09 ENCOUNTER — Ambulatory Visit (INDEPENDENT_AMBULATORY_CARE_PROVIDER_SITE_OTHER): Payer: Medicare Other

## 2021-08-09 ENCOUNTER — Encounter: Payer: Self-pay | Admitting: Internal Medicine

## 2021-08-09 VITALS — BP 110/70 | HR 77 | Temp 97.7°F | Ht 70.0 in | Wt 188.0 lb

## 2021-08-09 DIAGNOSIS — J029 Acute pharyngitis, unspecified: Secondary | ICD-10-CM

## 2021-08-09 DIAGNOSIS — R591 Generalized enlarged lymph nodes: Secondary | ICD-10-CM | POA: Diagnosis not present

## 2021-08-09 DIAGNOSIS — F32A Depression, unspecified: Secondary | ICD-10-CM

## 2021-08-09 DIAGNOSIS — L989 Disorder of the skin and subcutaneous tissue, unspecified: Secondary | ICD-10-CM

## 2021-08-09 DIAGNOSIS — Z125 Encounter for screening for malignant neoplasm of prostate: Secondary | ICD-10-CM

## 2021-08-09 DIAGNOSIS — I700 Atherosclerosis of aorta: Secondary | ICD-10-CM | POA: Diagnosis not present

## 2021-08-09 DIAGNOSIS — E559 Vitamin D deficiency, unspecified: Secondary | ICD-10-CM | POA: Diagnosis not present

## 2021-08-09 DIAGNOSIS — Z23 Encounter for immunization: Secondary | ICD-10-CM

## 2021-08-09 DIAGNOSIS — E538 Deficiency of other specified B group vitamins: Secondary | ICD-10-CM | POA: Diagnosis not present

## 2021-08-09 DIAGNOSIS — E0842 Diabetes mellitus due to underlying condition with diabetic polyneuropathy: Secondary | ICD-10-CM | POA: Diagnosis not present

## 2021-08-09 DIAGNOSIS — Z0001 Encounter for general adult medical examination with abnormal findings: Secondary | ICD-10-CM

## 2021-08-09 LAB — BASIC METABOLIC PANEL
BUN: 21 mg/dL (ref 6–23)
CO2: 27 mEq/L (ref 19–32)
Calcium: 9.9 mg/dL (ref 8.4–10.5)
Chloride: 99 mEq/L (ref 96–112)
Creatinine, Ser: 1.12 mg/dL (ref 0.40–1.50)
GFR: 65.03 mL/min (ref >60.00)
Glucose, Bld: 116 mg/dL — ABNORMAL HIGH (ref 70–99)
Potassium: 4.4 mEq/L (ref 3.5–5.1)
Sodium: 138 mEq/L (ref 135–145)

## 2021-08-09 LAB — CBC WITH DIFFERENTIAL/PLATELET
Basophils Absolute: 0 10*3/uL (ref 0.0–0.1)
Basophils Relative: 0.6 % (ref 0.0–3.0)
Eosinophils Absolute: 0.3 10*3/uL (ref 0.0–0.7)
Eosinophils Relative: 3.8 % (ref 0.0–5.0)
HCT: 46.9 % (ref 39.0–52.0)
Hemoglobin: 16 g/dL (ref 13.0–17.0)
Lymphocytes Relative: 18.4 % (ref 12.0–46.0)
Lymphs Abs: 1.4 10*3/uL (ref 0.7–4.0)
MCHC: 34.2 g/dL (ref 30.0–36.0)
MCV: 91.8 fl (ref 78.0–100.0)
Monocytes Absolute: 1.3 10*3/uL — ABNORMAL HIGH (ref 0.1–1.0)
Monocytes Relative: 17.5 % — ABNORMAL HIGH (ref 3.0–12.0)
Neutro Abs: 4.4 10*3/uL (ref 1.4–7.7)
Neutrophils Relative %: 59.7 % (ref 43.0–77.0)
Platelets: 169 10*3/uL (ref 150.0–400.0)
RBC: 5.11 Mil/uL (ref 4.22–5.81)
RDW: 13.9 % (ref 11.5–15.5)
WBC: 7.4 10*3/uL (ref 4.0–10.5)

## 2021-08-09 LAB — HEPATIC FUNCTION PANEL
ALT: 16 U/L (ref 0–53)
AST: 15 U/L (ref 0–37)
Albumin: 4.3 g/dL (ref 3.5–5.2)
Alkaline Phosphatase: 92 U/L (ref 39–117)
Bilirubin, Direct: 0.1 mg/dL (ref 0.0–0.3)
Total Bilirubin: 0.6 mg/dL (ref 0.2–1.2)
Total Protein: 7.3 g/dL (ref 6.0–8.3)

## 2021-08-09 LAB — VITAMIN D 25 HYDROXY (VIT D DEFICIENCY, FRACTURES): VITD: 36.23 ng/mL (ref 30.00–100.00)

## 2021-08-09 LAB — LIPID PANEL
Cholesterol: 136 mg/dL (ref 0–200)
HDL: 38.3 mg/dL — ABNORMAL LOW (ref >39.00)
LDL Cholesterol: 60 mg/dL (ref 0–99)
NonHDL: 97.67
Total CHOL/HDL Ratio: 4
Triglycerides: 186 mg/dL — ABNORMAL HIGH (ref 0.0–149.0)
VLDL: 37.2 mg/dL (ref 0.0–40.0)

## 2021-08-09 LAB — MICROALBUMIN / CREATININE URINE RATIO
Creatinine,U: 90.3 mg/dL
Microalb Creat Ratio: 3.9 mg/g (ref 0.0–30.0)
Microalb, Ur: 3.5 mg/dL — ABNORMAL HIGH (ref 0.0–1.9)

## 2021-08-09 LAB — TSH: TSH: 3.03 u[IU]/mL (ref 0.35–5.50)

## 2021-08-09 LAB — HEMOGLOBIN A1C: Hgb A1c MFr Bld: 7.6 % — ABNORMAL HIGH (ref 4.6–6.5)

## 2021-08-09 LAB — PSA: PSA: 0.37 ng/mL (ref 0.10–4.00)

## 2021-08-09 MED ORDER — DOXYCYCLINE HYCLATE 100 MG PO TABS: 100.0000 mg | ORAL_TABLET | Freq: Two times a day (BID) | ORAL | 0 refills | Status: DC

## 2021-08-09 MED ORDER — FLUOXETINE HCL 10 MG PO CAPS: 10.0000 mg | ORAL_CAPSULE | Freq: Every day | ORAL | 3 refills | Status: DC

## 2021-08-09 MED ORDER — CYANOCOBALAMIN 1000 MCG/ML IJ SOLN: 1000.0000 ug | Freq: Once | INTRAMUSCULAR | Status: DC

## 2021-08-09 NOTE — Assessment & Plan Note (Signed)
With mild worsening, declines SI or HI, for change celexa to prozac 10 mg qd

## 2021-08-09 NOTE — Patient Instructions (Addendum)
You had the Pneumovax pneumonia shot today  You will be contacted regarding the referral for: Eye doctor - Dr Katy Fitch  Please take all new medication as prescribed - the antibiotic, and the prozac   Ok to stop the celexa (citalopram)  Please continue all other medications as before, and refills have been done if requested.  Please have the pharmacy call with any other refills you may need.  Please continue your efforts at being more active, low cholesterol diet, and weight control.  You are otherwise up to date with prevention measures today.  Please keep your appointments with your specialists as you may have planned - hopefully you will hear soon about the new endocrinologist  Please go to the XRAY Department in the first floor for the x-ray testing  Please go to the LAB at the blood drawing area for the tests to be done  You will be contacted by phone if any changes need to be made immediately.  Otherwise, you will receive a letter about your results with an explanation, but please check with MyChart first.  Please remember to sign up for MyChart if you have not done so, as this will be important to you in the future with finding out test results, communicating by private email, and scheduling acute appointments online when needed.  Please make an Appointment to return in 6 months, or sooner if needed

## 2021-08-09 NOTE — Assessment & Plan Note (Signed)
Age and sex appropriate education and counseling updated with regular exercise and diet Referrals for preventative services - declines colonoscopy Immunizations addressed - for pneumovax, decliens covid booster, tdap Smoking counseling  - none needed Evidence for depression or other mood disorder - mild worsening depression, to start prozac 10 qd Most recent labs reviewed. I have personally reviewed and have noted: 1) the patient's medical and social history 2) The patient's current medications and supplements 3) The patient's height, weight, and BMI have been recorded in the chart

## 2021-08-09 NOTE — Assessment & Plan Note (Signed)
Dark, small but enlarging, left medial calf - for derm referral

## 2021-08-09 NOTE — Assessment & Plan Note (Signed)
Mild to mod, for antibx course - doxycycline,  to f/u any worsening symptoms or concerns 

## 2021-08-09 NOTE — Progress Notes (Signed)
Patient ID: [name], male   DOB: [birth year], 76 y.o.   MRN: [id number]         Chief Complaint:: wellness exam and irritation in throat (And swollen glands)  , dm, depression       HPI:  [name] is a 76 y.o. 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   Here with 2-3 days acute onset feverish, moderate ST pain, pressure, headache, general weakness and malaise, with bilateral neck LA, but pt denies chest pain, wheezing, increased sob or doe, orthopnea, PND, increased LE swelling, palpitations, dizziness or syncope.  Also has had mild worsening depressive symptoms, but no suicidal ideation, or panic; has ongoing anxiety.  Also has skin lesion to left mid medial calf, enlarging, asking for derm referral.     Wt Readings from Last 3 Encounters:  08/09/21 188 lb (85.3 kg)  06/15/21 192 lb 6.4 oz (87.3 kg)  03/09/21 195 lb 3.2 oz (88.5 kg)   BP Readings from Last 3 Encounters:  08/09/21 110/70  06/15/21 100/74  03/09/21 118/76   Immunization History  Administered Date(s) Administered   Influenza Whole 12/02/2008, 03/02/2012   Influenza, High Dose Seasonal PF 12/26/2014, 12/05/2016, 11/30/2017   Influenza,inj,Quad PF,6+ Mos 12/18/2013, 12/02/2019   Influenza,inj,quad, With Preservative 11/29/2018   Influenza-Unspecified 11/28/2012, 11/30/2017   Moderna Sars-Covid-2 Vaccination 04/29/2019, 05/27/2019   Pneumococcal Conjugate-13 02/04/2014   Pneumococcal Polysaccharide-23 08/09/2021   Zoster Recombinat (Shingrix) 03/03/2017, 07/31/2017   Health Maintenance Due  Topic Date Due   OPHTHALMOLOGY EXAM  02/29/2020      Past Medical History:  Diagnosis Date   CALLUS, RIGHT FOOT 12/08/2009   Cardiac arrest ([place])     Chronic systolic heart failure ([place]) 11/03/2009   DIABETES MELLITUS, TYPE II 12/05/2006   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/2009   HYPERLIPIDEMIA 11/25/2006   HYPOGONADISM, MALE 12/05/2006   Implantable cardiac defibrillator MDT 09/22/2008   Change out feb 2011, 2015   Ischemic cardiomyopathy 09/22/2008   DES CX and RCA  70% residual LAD  Done in [place]; EF 30%;; myoviewq 05-27-2006 no ischemia   Kidney stones    Long term (current) use of anticoagulants 04/25/2010   Myocardial infarction [facility]) 2004-05-26   "I died and they brought me back"   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   NEPHROLITHIASIS, HX OF 02/25/2008   Persistent atrial fibrillation ([place]) 11/25/2006   Inapp shock 05-27-2010 AF VR >220 recurrent   Past Surgical History:  Procedure Laterality Date   CARDIAC CATHETERIZATION     feburary 05-26-12   CARDIAC DEFIBRILLATOR PLACEMENT     Guidant Vitality [name]   CARDIOVERSION N/A 04/25/2012   Procedure: CARDIOVERSION;  Surgeon: Darlin Coco, MD;  Location: [facility] ENDOSCOPY;  Service: Cardiovascular;  Laterality: N/A;   CARDIOVERSION N/A 05/21/2012   Procedure: CARDIOVERSION;  Surgeon: Deboraha Sprang, MD;  Location: [facility];  Service: Cardiovascular;  Laterality: N/A;   CATARACT EXTRACTION     EYE SURGERY     cataracts   IMPLANTABLE CARDIOVERTER DEFIBRILLATOR (ICD) GENERATOR CHANGE N/A 02/05/2014   Procedure: ICD GENERATOR CHANGE;  Surgeon: Deboraha Sprang, MD;  Location: [facility] CATH LAB;  Service: Cardiovascular;  Laterality: N/A;   PTCA     TONSILLECTOMY     TONSILLECTOMY     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   Procedure: TRANSMETATARSAL AMPUTATION RIGHT;  Surgeon: Marty Heck, MD;  Location: [facility];  Service: Vascular;  Laterality: Right;    reports that he has never smoked. He has never used smokeless tobacco. He reports current alcohol use. He reports that he does not use drugs. family history includes Breast cancer in his mother; Diabetes in his maternal grandmother;  Hyperlipidemia in his maternal grandmother. Allergies  Allergen Reactions   Salmon [Fish Allergy] Other (See Comments)    Only canned/ not fresh (probably preservatives).    Sulfa Antibiotics     Not sure has allergyu to this Only canned/ not fresh (probably preservatives).    Metformin And Related Other (See Comments)    diarrhea   Current Outpatient Medications on File Prior to Visit  Medication Sig Dispense Refill   aspirin EC 81 MG tablet Take 81 mg by mouth daily. Swallow whole.     Continuous Blood Gluc Receiver (FREESTYLE LIBRE 14 DAY READER) DEVI USE AS DIRECTED 1 each 0   Continuous Blood Gluc Receiver (FREESTYLE LIBRE 2 READER) DEVI Use as instructure to check blood sugar 1 each 0   Continuous Blood Gluc Sensor (FREESTYLE LIBRE 2 SENSOR) MISC Apply new sensore every 14 days 6 each 3   dofetilide (TIKOSYN) 500 MCG capsule TAKE 1 CAPSULE BY MOUTH 2 TIMES DAILY (Patient taking differently: Take 500 mcg by mouth 2 (two) times daily.) 60 capsule 0   eplerenone (INSPRA) 25 MG tablet Take 25 mg by mouth daily.     fluticasone (FLONASE) 50 MCG/ACT nasal spray USE 2 SPRAYS IN EACH NOSTRIL EVERY DAY (Patient taking differently: Place 2 sprays into both nostrils daily.) 16 g 5   Glucose Blood (FREESTYLE PRECISION NEO TEST VI) by In Vitro route.     glucose blood (ONE TOUCH ULTRA TEST) test strip 1 each by Other route 2 (two) times daily. 200 each 2   insulin glargine (LANTUS SOLOSTAR) 100 UNIT/ML Solostar Pen Inject 10 Units into the skin daily. 15 mL 11   insulin lispro (ADMELOG SOLOSTAR) 100 UNIT/ML KwikPen 3 times a day (just before each meal) 01-08-09 units. 45 mL 3   Lancets MISC Use as directed up to 4 times per day 100 each 11   liraglutide (VICTOZA) 18 MG/3ML SOPN Inject 1.8 mg into the skin every evening.     loratadine (CLARITIN) 10 MG tablet Take 10 mg by mouth daily.     LORazepam (ATIVAN) 1 MG tablet TAKE 1/2 TABLET BY MOUTH EVERY DAY AS NEEDED FOR ANXIETY (Patient taking  differently: Take 0.5 mg by mouth 2 (two) times daily as needed for anxiety or sleep.) 45 tablet 1   Magnesium Oxide (MAG-OX 400 PO) Take 400 mg by mouth 2 (two) times daily.     metoprolol (TOPROL-XL) 200 MG 24 hr tablet Take 200 mg by mouth daily.     Multiple Vitamins-Minerals (MULTIVITAMIN PO) Take 1 tablet by mouth daily.     NITROSTAT 0.4 MG SL tablet Place 1 tablet (0.4 mg total) under the tongue every 5 (five) minutes as needed for chest pain. 25 tablet 5   rosuvastatin (CRESTOR) 20 MG tablet TAKE 1 TABLET BY MOUTH EVERY DAY (Patient taking differently: Take 20 mg by mouth every evening.) 90 tablet 0   ULTICARE MICRO PEN NEEDLES 32G X 4  MM MISC USE DAILY AS DIRECTED 100 each 3   XARELTO 20 MG TABS tablet TAKE 1 TABLET BY MOUTH EVERY DAY (Patient taking differently: Take 20 mg by mouth daily with supper.) 30 tablet 5   zolpidem (AMBIEN) 5 MG tablet TAKE 1 OR 2 TABLETS BY MOUTH NIGHTLY AT BEDTIME AS NEEDED (Patient taking differently: Take 10 mg by mouth at bedtime as needed for sleep.) 30 tablet 0   No current facility-administered medications on file prior to visit.        ROS:  All others reviewed and negative.  Objective        PE:  BP 110/70 (BP Location: Left Arm, Patient Position: Sitting, Cuff Size: Large)   Pulse 77   Temp 97.7 F (36.5 C) (Oral)   Ht '5\' 10"'$  (1.778 m)   Wt 188 lb (85.3 kg)   SpO2 98%   BMI 26.98 kg/m                 Constitutional: Pt appears in NAD, mild ill               HENT: Head: NCAT.                Right Ear: External ear normal.                 Left Ear: External ear normal. Bilat tm's with mild erythema.  Max sinus areas mild tender.  Pharynx with mild erythema, no exudate,but neck with bilateral submandibular tender LA               Eyes: . Pupils are equal, round, and reactive to light. Conjunctivae and EOM are normal               Nose: without d/c or deformity               Neck: Neck supple. Gross normal ROM               Cardiovascular:  Normal rate and regular rhythm.                 Pulmonary/Chest: Effort normal and breath sounds without rales or wheezing.                Abd:  Soft, NT, ND, + BS, no organomegaly               Neurological: Pt is alert. At baseline orientation, motor grossly intact               Skin: Skin is warm. No rashes, no other new lesions, LE edema - none               Psychiatric: Pt behavior is normal without agitation   Micro: none  Cardiac tracings I have personally interpreted today:  none  Pertinent Radiological findings (summarize): none   Lab Results  Component Value Date   WBC 10.3 02/14/2020   HGB 9.4 (L) 02/14/2020   HCT 27.8 (L) 02/14/2020   PLT 179 02/14/2020   GLUCOSE 221 (H) 02/14/2020   CHOL 119 09/11/2019   TRIG 374 (H) 09/11/2019   HDL 31 (L) 09/11/2019   LDLDIRECT 134.0 10/17/2017   LDLCALC 49 09/11/2019   ALT 15 02/11/2020   AST 21 02/11/2020   NA 133 (L) 02/14/2020   K 4.2 02/14/2020   CL 101 02/14/2020   CREATININE 1.36 (H) 02/14/2020   BUN 17 02/14/2020   CO2 25 02/14/2020  TSH 2.98 09/11/2019   PSA 0.4 09/11/2019   INR 1.6 (H) 02/11/2020   HGBA1C 7.1 (A) 06/15/2021   MICROALBUR 0.8 10/17/2017   Assessment/Plan:  RUBY DILONE is a 76 y.o. White or Caucasian [1] male with  has a past medical history of CALLUS, RIGHT FOOT (12/08/2009), Cardiac arrest [facility]), Chronic systolic heart failure ([place]) (11/03/2009), DIABETES MELLITUS, TYPE II (12/05/2006), DIABETIC PERIPHERAL NEUROPATHY (12/08/2009), HYPERLIPIDEMIA (11/25/2006), HYPOGONADISM, MALE (12/05/2006), Implantable cardiac defibrillator MDT (09/22/2008), Ischemic cardiomyopathy (09/22/2008), Kidney stones, Long term (current) use of anticoagulants (04/25/2010), Myocardial infarction ([place]) (2006), Neoplasm of uncertain behavior of skin (06/05/2007), NEPHROLITHIASIS, HX OF (02/25/2008), and Persistent atrial fibrillation ([place]) (11/25/2006).  Skin lesion Dark, small but enlarging, left medial calf - for derm  referral  Encounter for well adult exam with abnormal findings Age and sex appropriate education and counseling updated with regular exercise and diet Referrals for preventative services - declines colonoscopy Immunizations addressed - for pneumovax, decliens covid booster, tdap Smoking counseling  - none needed Evidence for depression or other mood disorder - mild worsening depression, to start prozac 10 qd Most recent labs reviewed. I have personally reviewed and have noted: 1) the patient's medical and social history 2) The patient's current medications and supplements 3) The patient's height, weight, and BMI have been recorded in the chart   Diabetes Lab Results  Component Value Date   HGBA1C 7.1 (A) 06/15/2021   Mild uncontrolled, goal a1c < 7, pt to continue current medical treatment lantus, admelog, victoza, and f/u with new endocrinologist when pt has been contacted; for optho referral as well  Depression/anxiety With mild worsening, declines SI or HI, for change celexa to prozac 10 mg qd  Head and neck lymphadenopathy Likely reactive it seems, but for cxr per pt request  Pharyngitis Mild to mod, for antibx course - doxycycline,  to f/u any worsening symptoms or concerns Followup: Return in about 6 months (around 02/08/2022).  [name] Cower, MD 08/09/2021 9:33 PM [facility]

## 2021-08-09 NOTE — Assessment & Plan Note (Addendum)
Lab Results  Component Value Date   HGBA1C 7.1 (A) 06/15/2021   Mild uncontrolled, goal a1c < 7, pt to continue current medical treatment lantus, admelog, victoza, and f/u with new endocrinologist when pt has been contacted; for optho referral as well

## 2021-08-09 NOTE — Assessment & Plan Note (Signed)
Likely reactive it seems, but for cxr per pt request

## 2021-08-10 ENCOUNTER — Telehealth: Payer: Self-pay

## 2021-08-10 ENCOUNTER — Other Ambulatory Visit: Payer: Self-pay | Admitting: Internal Medicine

## 2021-08-10 LAB — URINALYSIS, ROUTINE W REFLEX MICROSCOPIC
Bilirubin Urine: NEGATIVE
Hgb urine dipstick: NEGATIVE
Ketones, ur: NEGATIVE
Leukocytes,Ua: NEGATIVE
Nitrite: NEGATIVE
Specific Gravity, Urine: <=1.005 — AB (ref 1.000–1.030)
Total Protein, Urine: NEGATIVE
Urine Glucose: >=1000 — AB
Urobilinogen, UA: 0.2 (ref 0.0–1.0)
pH: 6.5 (ref 5.0–8.0)

## 2021-08-10 MED ORDER — LANTUS SOLOSTAR 100 UNIT/ML ~~LOC~~ SOPN: 14.0000 [IU] | PEN_INJECTOR | Freq: Every day | SUBCUTANEOUS | 11 refills | Status: DC

## 2021-08-10 NOTE — Telephone Encounter (Signed)
Pt called back for his results.   I advise the pt of Dr. Jenny Reichmann recommendation which states The test results show that your current treatment is OK, as the tests are stable , except the A1c is mildly higher at 7.6.  Please increae the lantus to 14 units per day.  And hopefully you will be able to follow up with your endocrinologist soon.  FYI

## 2021-08-10 NOTE — Telephone Encounter (Addendum)
  Patient was busy and will call back for results.  ----- Message from Biagio Borg, MD sent at 08/10/2021  1:07 PM EDT ----- The test results show that your current treatment is OK, as the tests are stable , except the A1c is mildly higher at 7.6.  Please increae the lantus to 14 units per day.  And hopefully you will be able to follow up with your endocrinologist soon.  Staff to please inform pt, I will do rx x 1

## 2021-08-16 DIAGNOSIS — B351 Tinea unguium: Secondary | ICD-10-CM | POA: Diagnosis not present

## 2021-08-16 DIAGNOSIS — L851 Acquired keratosis [keratoderma] palmaris et plantaris: Secondary | ICD-10-CM | POA: Diagnosis not present

## 2021-08-16 DIAGNOSIS — E1140 Type 2 diabetes mellitus with diabetic neuropathy, unspecified: Secondary | ICD-10-CM | POA: Diagnosis not present

## 2021-08-23 DIAGNOSIS — Z6826 Body mass index (BMI) 26.0-26.9, adult: Secondary | ICD-10-CM | POA: Diagnosis not present

## 2021-08-23 DIAGNOSIS — I5020 Unspecified systolic (congestive) heart failure: Secondary | ICD-10-CM | POA: Diagnosis not present

## 2021-08-23 DIAGNOSIS — I25118 Atherosclerotic heart disease of native coronary artery with other forms of angina pectoris: Secondary | ICD-10-CM | POA: Diagnosis not present

## 2021-09-07 ENCOUNTER — Telehealth: Payer: Self-pay

## 2021-09-07 NOTE — Telephone Encounter (Signed)
Spoke with the patient in regards to scheduling a follow up visit with another provider here at the practice, says that he has found a new Endo to take over his DM management.

## 2021-10-12 ENCOUNTER — Ambulatory Visit: Payer: Self-pay | Admitting: Licensed Clinical Social Worker

## 2021-10-12 NOTE — Patient Outreach (Signed)
  Care Coordination   10/12/2021 Name: [name] MRN: [id number] DOB: 12/11/1945   Care Coordination Outreach Attempts:  An unsuccessful telephone outreach was attempted today to offer the patient information about available care coordination services as a benefit of their health plan.   Follow Up Plan:  Additional outreach attempts will be made to offer the patient care coordination information and services.   Encounter Outcome:  No Answer  Care Coordination Interventions Activated:  No   Care Coordination Interventions:  No, not indicated    Casimer Lanius, [place] [phone]

## 2021-10-18 ENCOUNTER — Other Ambulatory Visit: Payer: Self-pay

## 2021-10-18 NOTE — Patient Outreach (Signed)
  Care Coordination   10/18/2021 Name: [name] MRN: [id number] DOB: May 15, 1945   Care Coordination Outreach Attempts:  A second unsuccessful outreach was attempted today to offer the patient with information about available care coordination services as a benefit of their health plan.     Follow Up Plan:  Additional outreach attempts will be made to offer the patient care coordination information and services.   Encounter Outcome:  No Answer  Care Coordination Interventions Activated:  No   Care Coordination Interventions:  No, not indicated    Thea Silversmith, RN, MSN, BSN, CCM Care Coordinator [phone]

## 2021-10-21 DIAGNOSIS — I5042 Chronic combined systolic (congestive) and diastolic (congestive) heart failure: Secondary | ICD-10-CM | POA: Diagnosis not present

## 2021-10-26 DIAGNOSIS — Z85828 Personal history of other malignant neoplasm of skin: Secondary | ICD-10-CM | POA: Diagnosis not present

## 2021-10-26 DIAGNOSIS — L821 Other seborrheic keratosis: Secondary | ICD-10-CM | POA: Diagnosis not present

## 2021-10-26 DIAGNOSIS — Z08 Encounter for follow-up examination after completed treatment for malignant neoplasm: Secondary | ICD-10-CM | POA: Diagnosis not present

## 2021-10-26 DIAGNOSIS — L570 Actinic keratosis: Secondary | ICD-10-CM | POA: Diagnosis not present

## 2021-10-26 DIAGNOSIS — D225 Melanocytic nevi of trunk: Secondary | ICD-10-CM | POA: Diagnosis not present

## 2021-11-02 DIAGNOSIS — T300 Burn of unspecified body region, unspecified degree: Secondary | ICD-10-CM | POA: Diagnosis not present

## 2021-11-02 DIAGNOSIS — X12XXXA Contact with other hot fluids, initial encounter: Secondary | ICD-10-CM | POA: Diagnosis not present

## 2021-11-08 DIAGNOSIS — E1140 Type 2 diabetes mellitus with diabetic neuropathy, unspecified: Secondary | ICD-10-CM | POA: Diagnosis not present

## 2021-11-08 DIAGNOSIS — B351 Tinea unguium: Secondary | ICD-10-CM | POA: Diagnosis not present

## 2021-11-08 DIAGNOSIS — L851 Acquired keratosis [keratoderma] palmaris et plantaris: Secondary | ICD-10-CM | POA: Diagnosis not present

## 2021-11-19 ENCOUNTER — Telehealth: Payer: Self-pay | Admitting: *Deleted

## 2021-11-19 ENCOUNTER — Encounter: Payer: Self-pay | Admitting: *Deleted

## 2021-11-19 NOTE — Patient Instructions (Signed)
Visit Information  Thank you for taking time to visit with me today. Please don't hesitate to contact me if I can be of assistance to you.   Following are the goals we discussed today:   Goals Addressed             This Visit's Progress    COMPLETED: Care Coordination Activities: No follow Up Required   On track    Care Coordination Interventions: Evaluation of current treatment plan related to DMII, anxiety and patient's adherence to plan as established by provider Advised patient to provide appropriate vaccination information to provider or CM team member at next visit Advised patient to obtain flu vaccine for upcoming 2023-24 winter/ flu season; discussed/ provided education around basic infection control practices Reviewed medications with patient and discussed effectiveness of recently prescribed new antidepressant: he reports "seems to be working fine, my anxiety is under control" Reviewed scheduled/upcoming provider appointments including 12/17/21- endocrinology provider; "eye/ vision exam scheduled in October" Assessed social determinant of health barriers Reviewed last PCP office visit 08/09/21 with patient- he confirms that his previously reported URI/ sore throat has resolved; confirms he is now taking 14 U long acting insulin as instructed; briefly reviewed blood sugars at home- he reports "they seem to be better;" he is looking forward to seeing new endocrinology provider next month           If you are experiencing a Mental Health or [place] or need someone to talk to, please  call the Suicide and Crisis Lifeline: 988 call the [place] National Suicide Prevention Lifeline: 8583581452 or TTY: 9471462231 TTY [phone]) to talk to a trained counselor call 1-800-273-TALK (toll free, 24 hour hotline) go to [facility] [address] [place] [phone]) call the [place]: [phone] call  911   Patient verbalizes understanding of instructions and care plan provided today and agrees to view in [place]. Active MyChart status and patient understanding of how to access instructions and care plan via MyChart confirmed with patient.     No further follow up required: no further or ongoing care coordination needs identified today  Oneta Rack, RN, BSN, CCRN Alumnus RN CM Care Coordination/ Transition of [place] Management [phone]: direct office

## 2021-11-19 NOTE — Patient Outreach (Signed)
  Care Coordination   Initial Visit Note   11/19/2021 Name: [name] MRN: [id number] DOB: 1945-06-07  [name] is a 76 y.o. year old male who sees Biagio Borg, MD for primary care. I spoke with  Elvera Maria by phone today.  What matters to the patients health and wellness today?  "I am doing pretty good; I think my sugars are better than they were when I saw Dr. Jenny Reichmann last, and I am planning to visit the new endocrinologist next month as scheduled; I am not having any problems.... I had a recent visit to the [facility] because I scalded myself, but I am healing up now.  I plan to get my flu vaccine soon."  No further or ongoing care coordination needs identified     Goals Addressed             This Visit's Progress    COMPLETED: Care Coordination Activities: No follow Up Required   On track    Care Coordination Interventions: Evaluation of current treatment plan related to DMII, anxiety and patient's adherence to plan as established by provider Advised patient to provide appropriate vaccination information to provider or CM team member at next visit Advised patient to obtain flu vaccine for upcoming 2023-24 winter/ flu season; discussed/ provided education around basic infection control practices Reviewed medications with patient and discussed effectiveness of recently prescribed new antidepressant: he reports "seems to be working fine, my anxiety is under control" Reviewed scheduled/upcoming provider appointments including 12/17/21- endocrinology provider; "eye/ vision exam scheduled in October" Assessed social determinant of health barriers Reviewed last PCP office visit 08/09/21 with patient- he confirms that his previously reported URI/ sore throat has resolved; confirms he is now taking 14 U long acting insulin as instructed; briefly reviewed blood sugars at home- he reports "they seem to be better;" he is looking forward to seeing new endocrinology provider next month            SDOH assessments and interventions completed:  Yes  SDOH Interventions Today    Flowsheet Row Most Recent Value  SDOH Interventions   Food Insecurity Interventions Intervention Not Indicated  Transportation Interventions Intervention Not Indicated  [patient drives self]       Care Coordination Interventions Activated:  Yes  Care Coordination Interventions:  Yes, provided   Follow up plan: No further intervention required.   Encounter Outcome:  Pt. Visit Completed   Oneta Rack, RN, BSN, CCRN Alumnus RN CM Care Coordination/ Transition of [place] Management [phone]: direct office

## 2021-11-22 DIAGNOSIS — I5042 Chronic combined systolic (congestive) and diastolic (congestive) heart failure: Secondary | ICD-10-CM | POA: Diagnosis not present

## 2021-11-23 ENCOUNTER — Other Ambulatory Visit (HOSPITAL_COMMUNITY): Payer: Self-pay

## 2021-11-23 ENCOUNTER — Other Ambulatory Visit: Payer: Self-pay

## 2021-11-23 DIAGNOSIS — E0842 Diabetes mellitus due to underlying condition with diabetic polyneuropathy: Secondary | ICD-10-CM

## 2021-11-23 MED ORDER — LANTUS SOLOSTAR 100 UNIT/ML ~~LOC~~ SOPN: 14.0000 [IU] | PEN_INJECTOR | Freq: Every day | SUBCUTANEOUS | 11 refills | Status: DC

## 2021-11-25 ENCOUNTER — Other Ambulatory Visit: Payer: Self-pay

## 2021-11-25 DIAGNOSIS — E0842 Diabetes mellitus due to underlying condition with diabetic polyneuropathy: Secondary | ICD-10-CM

## 2021-11-25 MED ORDER — FREESTYLE LIBRE 2 SENSOR MISC: 3 refills | Status: DC

## 2021-12-08 DIAGNOSIS — H04123 Dry eye syndrome of bilateral lacrimal glands: Secondary | ICD-10-CM | POA: Diagnosis not present

## 2021-12-08 DIAGNOSIS — E119 Type 2 diabetes mellitus without complications: Secondary | ICD-10-CM | POA: Diagnosis not present

## 2021-12-08 DIAGNOSIS — Z961 Presence of intraocular lens: Secondary | ICD-10-CM | POA: Diagnosis not present

## 2021-12-10 DIAGNOSIS — Z4502 Encounter for adjustment and management of automatic implantable cardiac defibrillator: Secondary | ICD-10-CM | POA: Diagnosis not present

## 2021-12-16 NOTE — Progress Notes (Signed)
Patient ID: [name], male   DOB: 1945/12/08, 76 y.o.   MRN: [id number]  HPI: [name] is a 76 y.o.-year-old male, returning for follow-up for DM2, dx in 16-May-2006, insulin-dependent since 15-May-2016, uncontrolled, with complications (CAD, CHF, CKD, PN, s/p trans metatarsal amputation). Pt. previously saw Dr. Loanne Drilling, last visit 6 months ago.  Reviewed HbA1c: Lab Results  Component Value Date   HGBA1C 7.6 (H) 08/09/2021   HGBA1C 7.1 (A) 06/15/2021   HGBA1C 6.9 (A) 03/09/2021   HGBA1C 6.3 (A) 04/01/2020   HGBA1C 6.9 (H) 02/11/2020   HGBA1C 7.0 (A) 12/10/2019   HGBA1C 6.9 (A) 09/04/2019   HGBA1C 6.8 (A) 06/05/2019   HGBA1C 7.2 (A) 03/06/2019   HGBA1C 6.4 (A) 12/03/2018   Pt is on a regimen of (however, patient appears to be confused about which medicines he is taking in the morning and which at night and exactly the dose of Lantus) - Lantus 14 units in am - Humalog 01-08-09 units before or after meals - Victoza 1.8 mg in am Metformin caused nausea. He declines a V-Go device due to cost.  Pt checks his sugars >4x a day - Libre CGM (receiver):   Lowest sugar was 40s; he has hypoglycemia awareness at 70.  Highest sugar was upper 200s.  Glucometer:?  - + CKD, last BUN/creatinine:  Lab Results  Component Value Date   BUN 21 08/09/2021   BUN 17 02/14/2020   CREATININE 1.12 08/09/2021   CREATININE 1.36 (H) 02/14/2020  He is not on ACE inhibitor or ARB.  -+ HL; last set of lipids: Lab Results  Component Value Date   CHOL 136 08/09/2021   HDL 38.30 (L) 08/09/2021   LDLCALC 60 08/09/2021   LDLDIRECT 134.0 10/17/2017   TRIG 186.0 (H) 08/09/2021   CHOLHDL 4 08/09/2021  On Crestor 20 mg daily.  - last eye exam was in 11/2021. No DR reportedly.   - no numbness and tingling in his feet.  Last foot exam 08/09/2021.  He has a history of nephrolithiasis, hypogonadism.  ROS: + see HPI No increased urination, blurry vision, nausea, chest pain. He has chest discomfort.   Past  Medical History:  Diagnosis Date   CALLUS, RIGHT FOOT 12/08/2009   Cardiac arrest [facility])    Chronic systolic heart failure ([place]) 11/03/2009   DIABETES MELLITUS, TYPE II 12/05/2006   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/2009   HYPERLIPIDEMIA 11/25/2006   HYPOGONADISM, MALE 12/05/2006   Implantable cardiac defibrillator MDT 09/22/2008   Change out feb 2011, 2015   Ischemic cardiomyopathy 09/22/2008   DES CX and RCA  70% residual LAD  Done in [place]; EF 30%;; myoviewq May 16, 2006 no ischemia   Kidney stones    Long term (current) use of anticoagulants 04/25/2010   Myocardial infarction [facility]) May 15, 2004   "I died and they brought me back"   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   NEPHROLITHIASIS, HX OF 02/25/2008   Persistent atrial fibrillation ([place]) 11/25/2006   Inapp shock 05/16/10 AF VR >220 recurrent   Past Surgical History:  Procedure Laterality Date   CARDIAC CATHETERIZATION     feburary May 15, 2012   CARDIAC DEFIBRILLATOR PLACEMENT     Guidant Vitality [name]   CARDIOVERSION N/A 04/25/2012   Procedure: CARDIOVERSION;  Surgeon: Darlin Coco, MD;  Location: [facility] ENDOSCOPY;  Service: Cardiovascular;  Laterality: N/A;   CARDIOVERSION N/A 05/21/2012   Procedure: CARDIOVERSION;  Surgeon: Deboraha Sprang, MD;  Location: [place];  Service: Cardiovascular;  Laterality: N/A;  CATARACT EXTRACTION     EYE SURGERY     cataracts   IMPLANTABLE CARDIOVERTER DEFIBRILLATOR (ICD) GENERATOR CHANGE N/A 02/05/2014   Procedure: ICD GENERATOR CHANGE;  Surgeon: Deboraha Sprang, MD;  Location: [facility] CATH LAB;  Service: Cardiovascular;  Laterality: N/A;   PTCA     TONSILLECTOMY     TONSILLECTOMY     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   Procedure: TRANSMETATARSAL AMPUTATION RIGHT;  Surgeon: Marty Heck, MD;  Location: [facility] OR;  Service: Vascular;  Laterality: Right;   Social History   Socioeconomic History   Marital status: Divorced    Spouse name: Not on file   Number of children: 1   Years of  education: 16   Highest education level: Not on file  Occupational History   Occupation: Education officer, museum business and show production    Employer: [employer]  Tobacco Use   Smoking status: Never   Smokeless tobacco: Never  Vaping Use   Vaping Use: Never used  Substance and Sexual Activity   Alcohol use: Yes    Alcohol/week: 0.0 standard drinks of alcohol    Comment: summer    Drug use: No   Sexual activity: Yes    Partners: Female  Other Topics Concern   Not on file  Social History Narrative   HSG, College grad. Married - divorced. 1 daughter. owner/operator Research officer, trade union business and show production   Social Determinants of Health   Financial Resource Strain: Not on file  Food Insecurity: No Food Insecurity (11/19/2021)   Hunger Vital Sign    Worried About Running Out of Food in the Last Year: Never true    Ran Out of Food in the Last Year: Never true  Transportation Needs: No Transportation Needs (11/19/2021)   PRAPARE - Hydrologist (Medical): No    Lack of Transportation (Non-Medical): No  Physical Activity: Not on file  Stress: Not on file  Social Connections: Not on file  Intimate Partner Violence: Not on file   Current Outpatient Medications on File Prior to Visit  Medication Sig Dispense Refill   aspirin EC 81 MG tablet Take 81 mg by mouth daily. Swallow whole.     Continuous Blood Gluc Receiver (FREESTYLE LIBRE 14 DAY READER) DEVI USE AS DIRECTED 1 each 0   Continuous Blood Gluc Receiver (FREESTYLE LIBRE 2 READER) DEVI Use as instructure to check blood sugar 1 each 0   Continuous Blood Gluc Sensor (FREESTYLE LIBRE 2 SENSOR) MISC Apply new sensore every 14 days 6 each 3   dofetilide (TIKOSYN) 500 MCG capsule TAKE 1 CAPSULE BY MOUTH 2 TIMES DAILY (Patient taking differently: Take 500 mcg by mouth 2 (two) times daily.) 60 capsule 0   doxycycline (VIBRA-TABS) 100 MG tablet Take 1 tablet (100 mg total) by mouth 2 (two) times  daily. 20 tablet 0   eplerenone (INSPRA) 25 MG tablet Take 25 mg by mouth daily.     FLUoxetine (PROZAC) 10 MG capsule Take 1 capsule (10 mg total) by mouth daily. 90 capsule 3   fluticasone (FLONASE) 50 MCG/ACT nasal spray USE 2 SPRAYS IN EACH NOSTRIL EVERY DAY (Patient taking differently: Place 2 sprays into both nostrils daily.) 16 g 5   Glucose Blood (FREESTYLE PRECISION NEO TEST VI) by In Vitro route.     glucose blood (ONE TOUCH ULTRA TEST) test strip 1 each by Other route 2 (two) times daily. 200 each 2   insulin glargine (LANTUS SOLOSTAR)  100 UNIT/ML Solostar Pen Inject 14 Units into the skin daily. 15 mL 11   insulin lispro (ADMELOG SOLOSTAR) 100 UNIT/ML KwikPen 3 times a day (just before each meal) 01-08-09 units. 45 mL 3   Lancets MISC Use as directed up to 4 times per day 100 each 11   liraglutide (VICTOZA) 18 MG/3ML SOPN Inject 1.8 mg into the skin every evening.     loratadine (CLARITIN) 10 MG tablet Take 10 mg by mouth daily.     LORazepam (ATIVAN) 1 MG tablet TAKE 1/2 TABLET BY MOUTH EVERY DAY AS NEEDED FOR ANXIETY (Patient taking differently: Take 0.5 mg by mouth 2 (two) times daily as needed for anxiety or sleep.) 45 tablet 1   Magnesium Oxide (MAG-OX 400 PO) Take 400 mg by mouth 2 (two) times daily.     metoprolol (TOPROL-XL) 200 MG 24 hr tablet Take 200 mg by mouth daily.     Multiple Vitamins-Minerals (MULTIVITAMIN PO) Take 1 tablet by mouth daily.     NITROSTAT 0.4 MG SL tablet Place 1 tablet (0.4 mg total) under the tongue every 5 (five) minutes as needed for chest pain. 25 tablet 5   rosuvastatin (CRESTOR) 20 MG tablet TAKE 1 TABLET BY MOUTH EVERY DAY (Patient taking differently: Take 20 mg by mouth every evening.) 90 tablet 0   ULTICARE MICRO PEN NEEDLES 32G X 4 MM MISC USE DAILY AS DIRECTED 100 each 3   XARELTO 20 MG TABS tablet TAKE 1 TABLET BY MOUTH EVERY DAY (Patient taking differently: Take 20 mg by mouth daily with supper.) 30 tablet 5   zolpidem (AMBIEN) 5 MG tablet  TAKE 1 OR 2 TABLETS BY MOUTH NIGHTLY AT BEDTIME AS NEEDED (Patient taking differently: Take 10 mg by mouth at bedtime as needed for sleep.) 30 tablet 0   No current facility-administered medications on file prior to visit.   Allergies  Allergen Reactions   Salmon [Fish Allergy] Other (See Comments)    Only canned/ not fresh (probably preservatives).    Sulfa Antibiotics     Not sure has allergyu to this Only canned/ not fresh (probably preservatives).    Metformin And Related Other (See Comments)    diarrhea   Family History  Problem Relation Age of Onset   Diabetes Maternal Grandmother    Hyperlipidemia Maternal Grandmother    Breast cancer Mother    Cancer Neg Hx    COPD Neg Hx    Heart disease Neg Hx    PE: BP 128/72 (BP Location: Right Arm, Patient Position: Sitting, Cuff Size: Normal)   Pulse 83   Ht '5\' 10"'$  (1.778 m)   Wt 193 lb 12.8 oz (87.9 kg)   SpO2 99%   BMI 27.81 kg/m  Wt Readings from Last 3 Encounters:  12/17/21 193 lb 12.8 oz (87.9 kg)  08/09/21 188 lb (85.3 kg)  06/15/21 192 lb 6.4 oz (87.3 kg)   Constitutional: overweight, in NAD Eyes: no exophthalmos ENT: moist mucous membranes, no thyromegaly, no cervical lymphadenopathy Cardiovascular: RRR, No MRG Respiratory: CTA B Musculoskeletal: + deformities: Transmetatarsal amputation Skin: moist, warm, no rashes Neurological: no tremor with outstretched hands  ASSESSMENT: 1. DM2, insulin-dependent, uncontrolled, with complications - CAD, s/p AMI - ICM, with CHF, s/p ICD - Persistent A-fib - CKD - PN - Transmetatarsal amputation  2. HL  PLAN:  1. Patient with long-standing, uncontrolled diabetes, on injectable antidiabetic regimen, with improving control.  Latest HbA1c was 7.6% in 07/2021, however, at today's visit, this is 6.7%,  improved. CGM interpretation: -At today's visit, we reviewed his CGM downloads: It appears that 77% of values are in target range (goal >70%), while 22% are higher than 180  (goal <25%), and 1% are lower than 70 (goal <4%).  The calculated average blood sugar is 149.  The projected HbA1c for the next 3 months ([facility]MI) is 8.9%. -Reviewing the CGM trends, sugars appear to be fluctuating mostly in the target range.  He does have some lower blood sugars overnight, decreasing after dinner, and upon questioning, most of his Humalog doses taken at night are actually after this meal.  Also, earlier in the day, he does not usually get his Humalog injected 10 minutes before the meals, but only after the meal.  As a consequence, sugars after breakfast increase, and they decreased afterwards.  Also, after some of his lunches, sugars are higher. -At this visit, we discussed about the importance of taking mealtime insulin approximately 10 to 15 minutes before each meal.  We discussed that if he needs to take Humalog after a meal, to decrease the dose to approximately 60 units. -For now, I advised him to continue Lantus in the morning.  It is unclear whether he is taking 10 or 14 units.  I advised him to continue this dose.  Upon questioning, he appears to be taking Victoza at night.  This may be a reason why sugars are dropping more overnight.  I advised him to move this to the morning. - I suggested to:  Patient Instructions  Please continue: - Lantus 14 units in am  Move: - Victoza 1.8 mg before breakfast - Humalog 10-11 units 15 min before the 3 meals  If you take Humalog after a meal, may need to reduce the dose to 6 units.  Please return in 3-4 months.  - check sugars at different times of the day - check 4x a day, rotating checks - discussed about CBG targets for treatment: 80-130 mg/dL before meals and <180 mg/dL after meals; target HbA1c <7%. - given foot care handout  - given instructions for hypoglycemia management "15-15 rule"  - advised for yearly eye exams  - Return to clinic in 3-4 mo  2. HL - Reviewed latest lipid panel from this summer: LDL slightly above our  goal of less than 55 due to cardiovascular disease, triglycerides slightly high, HDL low: Lab Results  Component Value Date   CHOL 136 08/09/2021   HDL 38.30 (L) 08/09/2021   LDLCALC 60 08/09/2021   LDLDIRECT 134.0 10/17/2017   TRIG 186.0 (H) 08/09/2021   CHOLHDL 4 08/09/2021  - Continues Crestor 20 mg daily without side effects.  Philemon Kingdom, MD PhD [facility] Endocrinology

## 2021-12-17 ENCOUNTER — Ambulatory Visit: Payer: Medicare Other | Admitting: Internal Medicine

## 2021-12-17 ENCOUNTER — Encounter: Payer: Self-pay | Admitting: Internal Medicine

## 2021-12-17 VITALS — BP 128/72 | HR 83 | Ht 70.0 in | Wt 193.8 lb

## 2021-12-17 DIAGNOSIS — E1165 Type 2 diabetes mellitus with hyperglycemia: Secondary | ICD-10-CM

## 2021-12-17 DIAGNOSIS — E1159 Type 2 diabetes mellitus with other circulatory complications: Secondary | ICD-10-CM | POA: Diagnosis not present

## 2021-12-17 DIAGNOSIS — E785 Hyperlipidemia, unspecified: Secondary | ICD-10-CM

## 2021-12-17 LAB — POCT GLYCOSYLATED HEMOGLOBIN (HGB A1C): Hemoglobin A1C: 6.7 % — AB (ref 4.0–5.6)

## 2021-12-17 NOTE — Patient Instructions (Signed)
Please continue: - Lantus 14 units in am  Move: - Victoza 1.8 mg before breakfast - Humalog 10-11 units 15 min before the 3 meals  If you take Humalog after a meal, may need to reduce the dose to 6 units.  Please return in 3-4 months.  PATIENT INSTRUCTIONS FOR TYPE 2 DIABETES:  **Please join MyChart!** - see attached instructions about how to join if you have not done so already.  DIET AND EXERCISE Diet and exercise is an important part of diabetic treatment.  We recommended aerobic exercise in the form of brisk walking (working between 40-60% of maximal aerobic capacity, similar to brisk walking) for 150 minutes per week (such as 30 minutes five days per week) along with 3 times per week performing 'resistance' training (using various gauge rubber tubes with handles) 5-10 exercises involving the major muscle groups (upper body, lower body and core) performing 10-15 repetitions (or near fatigue) each exercise. Start at half the above goal but build slowly to reach the above goals. If limited by weight, joint pain, or disability, we recommend daily walking in a swimming pool with water up to waist to reduce pressure from joints while allow for adequate exercise.    BLOOD GLUCOSES Monitoring your blood glucoses is important for continued management of your diabetes. Please check your blood glucoses 2-4 times a day: fasting, before meals and at bedtime (you can rotate these measurements - e.g. one day check before the 3 meals, the next day check before 2 of the meals and before bedtime, etc.).   HYPOGLYCEMIA (low blood sugar) Hypoglycemia is usually a reaction to not eating, exercising, or taking too much insulin/ other diabetes drugs.  Symptoms include tremors, sweating, hunger, confusion, headache, etc. Treat IMMEDIATELY with 15 grams of Carbs: 4 glucose tablets  cup regular juice/soda 2 tablespoons raisins 4 teaspoons sugar 1 tablespoon honey Recheck blood glucose in 15 mins and  repeat above if still symptomatic/blood glucose <100.  RECOMMENDATIONS TO REDUCE YOUR RISK OF DIABETIC COMPLICATIONS: * Take your prescribed MEDICATION(S) * Follow a DIABETIC diet: Complex carbs, fiber rich foods, (monounsaturated and polyunsaturated) fats * AVOID saturated/trans fats, high fat foods, >2,300 mg salt per day. * EXERCISE at least 5 times a week for 30 minutes or preferably daily.  * DO NOT SMOKE OR DRINK more than 1 drink a day. * Check your FEET every day. Do not wear tightfitting shoes. Contact us if you develop an ulcer * See your EYE doctor once a year or more if needed * Get a FLU shot once a year * Get a PNEUMONIA vaccine once before and once after age 69 years  GOALS:  * Your Hemoglobin A1c of <7%  * fasting sugars need to be 80-130 * after meals sugars need to be <180 (2h after you start eating) * Your Systolic BP should be 532 or lower  * Your Diastolic BP should be 80 or lower  * Your HDL (Good Cholesterol) should be 40 or higher  * Your LDL (Bad Cholesterol) should be ideally <70. * Your Triglycerides should be 150 or lower  * Your Urine microalbumin (kidney function) should be <30 * Your Body Mass Index should be 25 or lower   Please consider the following ways to cut down carbs and fat and increase fiber and micronutrients in your diet: - substitute whole grain for white bread or pasta - substitute brown rice for white rice - substitute 90-calorie flat bread pieces for slices of bread when possible -  substitute sweet potatoes or yams for white potatoes - substitute humus for margarine - substitute tofu for cheese when possible - substitute almond or rice milk for regular milk (would not drink soy milk daily due to concern for soy estrogen influence on breast cancer risk) - substitute dark chocolate for other sweets when possible - substitute water - can add lemon or orange slices for taste - for diet sodas (artificial sweeteners will trick your body that  you can eat sweets without getting calories and will lead you to overeating and weight gain in the long run) - do not skip breakfast or other meals (this will slow down the metabolism and will result in more weight gain over time)  - can try smoothies made from fruit and almond/rice milk in am instead of regular breakfast - can also try old-fashioned (not instant) oatmeal made with almond/rice milk in am - order the dressing on the side when eating salad at a restaurant (pour less than half of the dressing on the salad) - eat as little meat as possible - can try juicing, but should not forget that juicing will get rid of the fiber, so would alternate with eating raw veg./fruits or drinking smoothies - use as little oil as possible, even when using olive oil - can dress a salad with a mix of balsamic vinegar and lemon juice, for e.g. - use agave nectar, stevia sugar, or regular sugar rather than artificial sweateners - steam or broil/roast veggies  - snack on veggies/fruit/nuts (unsalted, preferably) when possible, rather than processed foods - reduce or eliminate aspartame in diet (it is in diet sodas, chewing gum, etc) Read the labels!  Try to read Dr. Janene Harvey book: "Program for Reversing Diabetes" for other ideas for healthy eating.

## 2021-12-23 DIAGNOSIS — I5020 Unspecified systolic (congestive) heart failure: Secondary | ICD-10-CM | POA: Diagnosis not present

## 2021-12-23 DIAGNOSIS — I10 Essential (primary) hypertension: Secondary | ICD-10-CM | POA: Diagnosis not present

## 2022-01-17 DIAGNOSIS — B351 Tinea unguium: Secondary | ICD-10-CM | POA: Diagnosis not present

## 2022-01-17 DIAGNOSIS — L97411 Non-pressure chronic ulcer of right heel and midfoot limited to breakdown of skin: Secondary | ICD-10-CM | POA: Diagnosis not present

## 2022-01-17 DIAGNOSIS — L851 Acquired keratosis [keratoderma] palmaris et plantaris: Secondary | ICD-10-CM | POA: Diagnosis not present

## 2022-01-17 DIAGNOSIS — E1142 Type 2 diabetes mellitus with diabetic polyneuropathy: Secondary | ICD-10-CM | POA: Diagnosis not present

## 2022-01-19 DIAGNOSIS — H903 Sensorineural hearing loss, bilateral: Secondary | ICD-10-CM | POA: Diagnosis not present

## 2022-02-07 DIAGNOSIS — L97511 Non-pressure chronic ulcer of other part of right foot limited to breakdown of skin: Secondary | ICD-10-CM | POA: Diagnosis not present

## 2022-02-24 ENCOUNTER — Ambulatory Visit: Payer: Medicare Other

## 2022-03-07 DIAGNOSIS — L97511 Non-pressure chronic ulcer of other part of right foot limited to breakdown of skin: Secondary | ICD-10-CM | POA: Diagnosis not present

## 2022-03-10 DIAGNOSIS — Z45018 Encounter for adjustment and management of other part of cardiac pacemaker: Secondary | ICD-10-CM | POA: Diagnosis not present

## 2022-03-18 ENCOUNTER — Ambulatory Visit (INDEPENDENT_AMBULATORY_CARE_PROVIDER_SITE_OTHER): Payer: Medicare Other

## 2022-03-18 VITALS — Ht 70.0 in | Wt 182.0 lb

## 2022-03-18 DIAGNOSIS — Z0000 Encounter for general adult medical examination without abnormal findings: Secondary | ICD-10-CM

## 2022-03-18 NOTE — Progress Notes (Signed)
Virtual Visit via Telephone Note  I connected with  [name] on 03/18/22 at  3:00 PM EST by telephone and verified that I am speaking with the correct person using two identifiers.  Location: Patient: Home Provider: [place] Persons participating in the virtual visit: [place]   I discussed the limitations, risks, security and privacy concerns of performing an evaluation and management service by telephone and the availability of in person appointments. The patient expressed understanding and agreed to proceed.  Interactive audio and video telecommunications were attempted between this nurse and patient, however failed, due to patient having technical difficulties OR patient did not have access to video capability.  We continued and completed visit with audio only.  Some vital signs may be absent or patient reported.   Sheral Flow, LPN  Subjective:   [name] is a 77 y.o. male who presents for an Initial Medicare Annual Wellness Visit.  Review of Systems     Cardiac Risk Factors include: advanced age (>58mn, >>55women);diabetes mellitus;dyslipidemia;hypertension;male gender;sedentary lifestyle     Objective:    Today's Vitals   03/18/22 1502  Weight: 182 lb (82.6 kg)  Height: '5\' 10"'$  (1.778 m)  PainSc: 0-No pain   Body mass index is 26.11 kg/m.     03/18/2022    3:06 PM 02/12/2020    7:00 AM 03/18/2017   10:26 PM 12/24/2015    1:07 AM 02/05/2014    6:39 AM 04/25/2012    8:28 AM 03/22/2012   11:23 AM  Advanced Directives  Does Patient Have a Medical Advance Directive? Yes Yes Yes No Yes Patient does not have advance directive Patient does not have advance directive  Type of Advance Directive [place] Healthcare Power of [place]  Living will    Does patient want to make changes to medical advance directive?  No - Guardian declined       Copy of [place] Chart? No - copy requested No - copy requested   No - copy requested    Pre-existing out of facility DNR order (yellow form or pink MOST form)      No No    Current Medications (verified) Outpatient Encounter Medications as of 03/18/2022  Medication Sig   aspirin EC 81 MG tablet Take 81 mg by mouth daily. Swallow whole.   Continuous Blood Gluc Receiver (FREESTYLE LIBRE 14 DAY READER) DEVI USE AS DIRECTED   Continuous Blood Gluc Receiver (FREESTYLE LIBRE 2 READER) DEVI Use as instructure to check blood sugar   Continuous Blood Gluc Sensor (FREESTYLE LIBRE 2 SENSOR) MISC Apply new sensore every 14 days   dofetilide (TIKOSYN) 500 MCG capsule TAKE 1 CAPSULE BY MOUTH 2 TIMES DAILY (Patient taking differently: Take 500 mcg by mouth 2 (two) times daily.)   doxycycline (VIBRA-TABS) 100 MG tablet Take 1 tablet (100 mg total) by mouth 2 (two) times daily.   eplerenone (INSPRA) 25 MG tablet Take 25 mg by mouth daily.   FLUoxetine (PROZAC) 10 MG capsule Take 1 capsule (10 mg total) by mouth daily.   fluticasone (FLONASE) 50 MCG/ACT nasal spray USE 2 SPRAYS IN EACH NOSTRIL EVERY DAY (Patient taking differently: Place 2 sprays into both nostrils daily.)   Glucose Blood (FREESTYLE PRECISION NEO TEST VI) by In Vitro route.   glucose blood (ONE TOUCH ULTRA TEST) test strip 1 each by Other route 2 (two) times daily.   insulin glargine (  LANTUS SOLOSTAR) 100 UNIT/ML Solostar Pen Inject 14 Units into the skin daily.   insulin lispro (ADMELOG SOLOSTAR) 100 UNIT/ML KwikPen 3 times a day (just before each meal) 01-08-09 units.   Lancets MISC Use as directed up to 4 times per day   liraglutide (VICTOZA) 18 MG/3ML SOPN Inject 1.8 mg into the skin every evening.   loratadine (CLARITIN) 10 MG tablet Take 10 mg by mouth daily.   LORazepam (ATIVAN) 1 MG tablet TAKE 1/2 TABLET BY MOUTH EVERY DAY AS NEEDED FOR ANXIETY (Patient taking differently: Take 0.5 mg by mouth 2 (two) times daily as needed for  anxiety or sleep.)   Magnesium Oxide (MAG-OX 400 PO) Take 400 mg by mouth 2 (two) times daily.   metoprolol (TOPROL-XL) 200 MG 24 hr tablet Take 200 mg by mouth daily.   Multiple Vitamins-Minerals (MULTIVITAMIN PO) Take 1 tablet by mouth daily.   NITROSTAT 0.4 MG SL tablet Place 1 tablet (0.4 mg total) under the tongue every 5 (five) minutes as needed for chest pain.   rosuvastatin (CRESTOR) 20 MG tablet TAKE 1 TABLET BY MOUTH EVERY DAY (Patient taking differently: Take 20 mg by mouth every evening.)   ULTICARE MICRO PEN NEEDLES 32G X 4 MM MISC USE DAILY AS DIRECTED   XARELTO 20 MG TABS tablet TAKE 1 TABLET BY MOUTH EVERY DAY (Patient taking differently: Take 20 mg by mouth daily with supper.)   zolpidem (AMBIEN) 5 MG tablet TAKE 1 OR 2 TABLETS BY MOUTH NIGHTLY AT BEDTIME AS NEEDED (Patient taking differently: Take 10 mg by mouth at bedtime as needed for sleep.)   No facility-administered encounter medications on file as of 03/18/2022.    Allergies (verified) Salmon [fish allergy], Sulfa antibiotics, and Metformin and related   History: Past Medical History:  Diagnosis Date   CALLUS, RIGHT FOOT 12/08/2009   Cardiac arrest ([place])    Chronic systolic heart failure ([place]) 11/03/2009   DIABETES MELLITUS, TYPE II 12/05/2006   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/2009   HYPERLIPIDEMIA 11/25/2006   HYPOGONADISM, MALE 12/05/2006   Implantable cardiac defibrillator MDT 09/22/2008   Change out feb 2011, 2015   Ischemic cardiomyopathy 09/22/2008   DES CX and RCA  70% residual LAD  Done in [place]; EF 30%;; myoviewq 05-31-2006 no ischemia   Kidney stones    Long term (current) use of anticoagulants 04/25/2010   Myocardial infarction [facility]) 2004/05/30   "I died and they brought me back"   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   NEPHROLITHIASIS, HX OF 02/25/2008   Persistent atrial fibrillation ([place]) 11/25/2006   Inapp shock 2010-05-31 AF VR >220 recurrent   Past Surgical History:  Procedure Laterality Date    CARDIAC CATHETERIZATION     feburary 05/30/2012   CARDIAC DEFIBRILLATOR PLACEMENT     Guidant Vitality [name]   CARDIOVERSION N/A 04/25/2012   Procedure: CARDIOVERSION;  Surgeon: Darlin Coco, MD;  [facility] END[facility];  Service: Cardiovascular;  Laterality: N/A;   CARDIOVERSION N/A 05/21/2012   Procedure: CARDIOVERSION;  Surgeon: Deboraha Sprang, MD;  Location: [facility];  Service: Cardiovascular;  Laterality: N/A;   CATARACT EXTRACTION     EYE SURGERY     cataracts   IMPLANTABLE CARDIOVERTER DEFIBRILLATOR (ICD) GENERATOR CHANGE N/A 02/05/2014   Procedure: ICD GENERATOR CHANGE;  Surgeon: Deboraha Sprang, MD;  Location[facility] C[facility] LAB;  Service: Cardiovascular;  Laterality: N/A;   PTCA     TONSILLECTOMY     TONSILLECTOMY     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  Procedure: TRANSMETATARSAL AMPUTATION RIGHT;  Surgeon: Marty Heck, MD;  Location: [facility] Main OR;  Service: Vascular;  Laterality: Right;   Family History  Problem Relation Age of Onset   Diabetes Maternal Grandmother    Hyperlipidemia Maternal Grandmother    Breast cancer Mother    Cancer Neg Hx    COPD Neg Hx    Heart disease Neg Hx    Social History   Socioeconomic History   Marital status: Divorced    Spouse name: Not on file   Number of children: 1   Years of education: 16   Highest education level: Not on file  Occupational History   Occupation: Education officer, museum business and show production    Employer: [employer]  Tobacco Use   Smoking status: Never   Smokeless tobacco: Never  Vaping Use   Vaping Use: Never used  Substance and Sexual Activity   Alcohol use: Yes    Alcohol/week: 0.0 standard drinks of alcohol    Comment: summer    Drug use: No   Sexual activity: Yes    Partners: Female  Other Topics Concern   Not on file  Social History Narrative   HSG, College grad. Married - divorced. 1 daughter. owner/operator Research officer, trade union business and show production   Social Determinants of  Health   Financial Resource Strain: Low Risk  (03/18/2022)   Overall Financial Resource Strain (CARDIA)    Difficulty of Paying Living Expenses: Not hard at all  Food Insecurity: No Food Insecurity (03/18/2022)   Hunger Vital Sign    Worried About Running Out of Food in the Last Year: Never true    Ran Out of Food in the Last Year: Never true  Transportation Needs: No Transportation Needs (03/18/2022)   PRAPARE - Hydrologist (Medical): No    Lack of Transportation (Non-Medical): No  Physical Activity: Inactive (03/18/2022)   Exercise Vital Sign    Days of Exercise per Week: 0 days    Minutes of Exercise per Session: 0 min  Stress: No Stress Concern Present (03/18/2022)   [place]    Feeling of Stress : Not at all  Social Connections: Unknown (03/18/2022)   Social Connection and Isolation Panel [NHANES]    Frequency of Communication with Friends and Family: More than three times a week    Frequency of Social Gatherings with Friends and Family: More than three times a week    Attends Religious Services: Patient refused    Marine scientist or Organizations: No    Attends Music therapist: Patient refused    Marital Status: Divorced    Tobacco Counseling Counseling given: Not Answered   Clinical Intake:  Pre-visit preparation completed: Yes  Pain : No/denies pain Pain Score: 0-No pain     BMI - recorded: 26.11 Nutritional Status: BMI 25 -29 Overweight Nutritional Risks: None Diabetes: No  How often do you need to have someone help you when you read instructions, pamphlets, or other written materials from your doctor or pharmacy?: 1 - Never 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  No  Does the patient have any non-healing wounds?  No  Has the  patient had any unintentional weight loss or weight gain?  No   Diabetes:  Is the patient diabetic?  Yes  If diabetic, was a CBG obtained today?  No  Did the patient bring in their glucometer from home?  No  How often do you monitor your CBG's? Freestyle Libre Continuous Monitor.   Financial Strains and Diabetes Management:  Are you having any financial strains with the device, your supplies or your medication? No .  Does the patient want to be seen by Chronic Care Management for management of their diabetes?  No  Would the patient like to be referred to a Nutritionist or for Diabetic Management?  No   Diabetic Exams:  Diabetic Eye Exam: Completed 11/2021 [facility] Diabetic Foot Exam: Completed 08/09/2021   Interpreter Needed?: No  Information entered by :: Lisette Abu, LPN.   Activities of Daily Living    03/18/2022    3:18 PM  In your present state of health, do you have any difficulty performing the following activities:  Hearing? 0  Vision? 0  Difficulty concentrating or making decisions? 1  Walking or climbing stairs? 0  Dressing or bathing? 0  Doing errands, shopping? 0  Preparing Food and eating ? N  Using the Toilet? N  In the past six months, have you accidently leaked urine? N  Do you have problems with loss of bowel control? N  Managing your Medications? N  Managing your Finances? N  Housekeeping or managing your Housekeeping? N    Patient Care Team: Biagio Borg, MD as PCP - General (Internal Medicine) [facility] as Consulting Physician (Ophthalmology)  Indicate any recent Medical Services you may have received from other than [facility] providers in the past year (date may be approximate).     Assessment:   This is a routine wellness examination for [place].  Hearing/Vision screen Hearing Screening - Comments:: Patient wears hearing aids. Vision Screening - Comments:: Wears rx glasses - up to date with routine eye exams with  [facility].   Dietary issues and exercise activities discussed: Current Exercise Habits: The patient does not participate in regular exercise at present, Exercise limited by: psychological condition(s);cardiac condition(s)   Goals Addressed             This Visit's Progress    Client will verbalize knowledge of diabetes self-management as evidenced by Hgb A1C <7 or as defined by provider.            Depression Screen    03/18/2022    3:17 PM 08/09/2021   11:55 AM 09/11/2019    3:26 PM 10/11/2016    3:07 PM 02/04/2014    2:10 PM  PHQ 2/9 Scores  PHQ - 2 Score '2 2 1 '$ 0 0  PHQ- 9 Score 8 7       Fall Risk    03/18/2022    3:07 PM 08/09/2021   11:56 AM 09/11/2019    3:26 PM 09/11/2019    3:12 PM 10/17/2017   10:30 AM  Fall Risk   Falls in the past year? 0 1 0 0 No  Number falls in past yr: 0 0     Injury with Fall? 0 0     Risk for fall due to : No Fall Risks      Follow up Falls prevention discussed        [place]:  Any stairs in or around the home? No  If so, are there any without handrails? No  Home free of loose throw rugs in walkways, pet beds,  electrical cords, etc? Yes  Adequate lighting in your home to reduce risk of falls? Yes   ASSISTIVE DEVICES UTILIZED TO PREVENT FALLS:  Life alert? No  Use of a cane, walker or w/c? No  Grab bars in the bathroom? Yes  Shower chair or bench in shower? Yes  Elevated toilet seat or a handicapped toilet? Yes   TIMED UP AND GO:  Was the test performed? No . Phone Visit  Cognitive Function:        03/18/2022    3:19 PM  6CIT Screen  What Year? 0 points  What month? 0 points  What time? 0 points  Count back from 20 0 points  Months in reverse 0 points  Repeat phrase 0 points  Total Score 0 points    Immunizations Immunization History  Administered Date(s) Administered   Influenza Whole 12/02/2008, 03/02/2012   Influenza, High Dose Seasonal PF 12/26/2014, 12/05/2016,  11/30/2017   Influenza,inj,Quad PF,6+ Mos 12/18/2013, 12/02/2019   Influenza,inj,quad, With Preservative 11/29/2018   Influenza-Unspecified 11/28/2012, 11/30/2017   Moderna Sars-Covid-2 Vaccination 04/29/2019, 05/27/2019   PFIZER Comirnaty(Gray Top)Covid-19 Tri-Sucrose Vaccine 12/27/2021   Pneumococcal Conjugate-13 02/04/2014   Pneumococcal Polysaccharide-23 08/09/2021   Zoster Recombinat (Shingrix) 03/03/2017, 07/31/2017    TDAP status: Due, Education has been provided regarding the importance of this vaccine. Advised may receive this vaccine at local pharmacy or Health Dept. Aware to provide a copy of the vaccination record if obtained from local pharmacy or Health Dept. Verbalized acceptance and understanding.  Flu Vaccine status: Due, Education has been provided regarding the importance of this vaccine. Advised may receive this vaccine at local pharmacy or Health Dept. Aware to provide a copy of the vaccination record if obtained from local pharmacy or Health Dept. Verbalized acceptance and understanding.  Pneumococcal vaccine status: Up to date  Covid-19 vaccine status: Completed vaccines  Qualifies for Shingles Vaccine? Yes   Zostavax completed No   Shingrix Completed?: Yes  Screening Tests Health Maintenance  Topic Date Due   DTaP/Tdap/Td (1 - Tdap) Never done   OPHTHALMOLOGY EXAM  02/29/2020   INFLUENZA VACCINE  09/28/2021   COVID-19 Vaccine (4 - 2023-24 season) 02/21/2022   COLONOSCOPY (Pts 45-11yr Insurance coverage will need to be confirmed)  08/10/2022 (Originally 11/22/2010)   HEMOGLOBIN A1C  06/18/2022   Diabetic kidney evaluation - eGFR measurement  08/10/2022   Diabetic kidney evaluation - Urine ACR  08/10/2022   FOOT EXAM  08/10/2022   Medicare Annual Wellness (AWV)  03/19/2023   Pneumonia Vaccine 75 Years old  Completed   Hepatitis C Screening  Completed   Zoster Vaccines- Shingrix  Completed   HPV VACCINES  Aged Out    Health Maintenance  Health  Maintenance Due  Topic Date Due   DTaP/Tdap/Td (1 - Tdap) Never done   OPHTHALMOLOGY EXAM  02/29/2020   INFLUENZA VACCINE  09/28/2021   COVID-19 Vaccine (4 - 2023-24 season) 02/21/2022   Colorectal cancer screening: No longer required.  Patient declined.  Lung Cancer Screening: (Low Dose CT Chest recommended if Age 75-80years, 30 pack-year currently smoking OR have quit w/in 15years.) does not qualify.   Lung Cancer Screening Referral: no  Additional Screening:  Hepatitis C Screening: does qualify; Completed 10/12/2016  Vision Screening: Recommended annual ophthalmology exams for early detection of glaucoma and other disorders of the eye. Is the patient up to date with their annual eye exam?  Yes  Who is the provider or what is the name of the office in  which the patient attends annual eye exams? [facility] If pt is not established with a provider, would they like to be referred to a provider to establish care? No .   Dental Screening: Recommended annual dental exams for proper oral hygiene  Community Resource Referral / Chronic Care Management: CRR required this visit?  No   CCM required this visit?  No      Plan:     I have personally reviewed and noted the following in the patient's chart:   Medical and social history Use of alcohol, tobacco or illicit drugs  Current medications and supplements including opioid prescriptions. Patient is not currently taking opioid prescriptions. Functional ability and status Nutritional status Physical activity Advanced directives List of other physicians Hospitalizations, surgeries, and ER visits in previous 12 months Vitals Screenings to include cognitive, depression, and falls Referrals and appointments  In addition, I have reviewed and discussed with patient certain preventive protocols, quality metrics, and best practice recommendations. A written personalized care plan for preventive services as well as general preventive  health recommendations were provided to patient.     Sheral Flow, LPN   [id number]   Nurse Notes: N/A

## 2022-03-18 NOTE — Patient Instructions (Addendum)
[name] , Thank you for taking time to come for your Medicare Wellness Visit. I appreciate your ongoing commitment to your health goals. Please review the following plan we discussed and let me know if I can assist you in the future.   These are the goals we discussed:  Goals       Client will verbalize knowledge of diabetes self-management as evidenced by Hgb A1C <7 or as defined by provider. (pt-stated)      My goal for 2024 is to get more physically active especially during the winter months.  I want to start doing some light weights to build up muscle tone.        This is a list of the screening recommended for you and due dates:  Health Maintenance  Topic Date Due   DTaP/Tdap/Td vaccine (1 - Tdap) Never done   Eye exam for diabetics  02/29/2020   Flu Shot  09/28/2021   COVID-19 Vaccine (4 - 2023-24 season) 02/21/2022   Colon Cancer Screening  08/10/2022*   Hemoglobin A1C  06/18/2022   Yearly kidney function blood test for diabetes  08/10/2022   Yearly kidney health urinalysis for diabetes  08/10/2022   Complete foot exam   08/10/2022   Medicare Annual Wellness Visit  03/19/2023   Pneumonia Vaccine  Completed   Hepatitis C Screening: USPSTF Recommendation to screen - Ages 75-79 yo.  Completed   Zoster (Shingles) Vaccine  Completed   HPV Vaccine  Aged Out  *Topic was postponed. The date shown is not the original due date.    Advanced directives: Yes  Conditions/risks identified: Yes; Type II Diabetes  Next appointment: Follow up in one year for your annual wellness visit.   Preventive Care 75 Years and Older, Male  Preventive care refers to lifestyle choices and visits with your health care provider that can promote health and wellness. What does preventive care include? A yearly physical exam. This is also called an annual well check. Dental exams once or twice a year. Routine eye exams. Ask your health care provider how often you should have your eyes  checked. Personal lifestyle choices, including: Daily care of your teeth and gums. Regular physical activity. Eating a healthy diet. Avoiding tobacco and drug use. Limiting alcohol use. Practicing safe sex. Taking low doses of aspirin every day. Taking vitamin and mineral supplements as recommended by your health care provider. What happens during an annual well check? The services and screenings done by your health care provider during your annual well check will depend on your age, overall health, lifestyle risk factors, and family history of disease. Counseling  Your health care provider may ask you questions about your: Alcohol use. Tobacco use. Drug use. Emotional well-being. Home and relationship well-being. Sexual activity. Eating habits. History of falls. Memory and ability to understand (cognition). Work and work Statistician. Screening  You may have the following tests or measurements: Height, weight, and BMI. Blood pressure. Lipid and cholesterol levels. These may be checked every 5 years, or more frequently if you are over 26 years old. Skin check. Lung cancer screening. You may have this screening every year starting at age 50 if you have a 30-pack-year history of smoking and currently smoke or have quit within the past 15 years. Fecal occult blood test (FOBT) of the stool. You may have this test every year starting at age 66. Flexible sigmoidoscopy or colonoscopy. You may have a sigmoidoscopy every 5 years or a colonoscopy every 10 years  starting at age 47. Prostate cancer screening. Recommendations will vary depending on your family history and other risks. Hepatitis C blood test. Hepatitis B blood test. Sexually transmitted disease (STD) testing. Diabetes screening. This is done by checking your blood sugar (glucose) after you have not eaten for a while (fasting). You may have this done every 1-3 years. Abdominal aortic aneurysm (AAA) screening. You may need this  if you are a current or former smoker. Osteoporosis. You may be screened starting at age 72 if you are at high risk. Talk with your health care provider about your test results, treatment options, and if necessary, the need for more tests. Vaccines  Your health care provider may recommend certain vaccines, such as: Influenza vaccine. This is recommended every year. Tetanus, diphtheria, and acellular pertussis (Tdap, Td) vaccine. You may need a Td booster every 10 years. Zoster vaccine. You may need this after age 66. Pneumococcal 13-valent conjugate (PCV13) vaccine. One dose is recommended after age 76. Pneumococcal polysaccharide (PPSV23) vaccine. One dose is recommended after age 75. Talk to your health care provider about which screenings and vaccines you need and how often you need them. This information is not intended to replace advice given to you by your health care provider. Make sure you discuss any questions you have with your health care provider. Document Released: 03/13/2015 Document Revised: 11/04/2015 Document Reviewed: 12/16/2014 Elsevier Interactive Patient Education  2017 [place] Prevention in the Home Falls can cause injuries. They can happen to people of all ages. There are many things you can do to make your home safe and to help prevent falls. What can I do on the outside of my home? Regularly fix the edges of walkways and driveways and fix any cracks. Remove anything that might make you trip as you walk through a door, such as a raised step or threshold. Trim any bushes or trees on the path to your home. Use bright outdoor lighting. Clear any walking paths of anything that might make someone trip, such as rocks or tools. Regularly check to see if handrails are loose or broken. Make sure that both sides of any steps have handrails. Any raised decks and porches should have guardrails on the edges. Have any leaves, snow, or ice cleared regularly. Use sand  or salt on walking paths during winter. Clean up any spills in your garage right away. This includes oil or grease spills. What can I do in the bathroom? Use night lights. Install grab bars by the toilet and in the tub and shower. Do not use towel bars as grab bars. Use non-skid mats or decals in the tub or shower. If you need to sit down in the shower, use a plastic, non-slip stool. Keep the floor dry. Clean up any water that spills on the floor as soon as it happens. Remove soap buildup in the tub or shower regularly. Attach bath mats securely with double-sided non-slip rug tape. Do not have throw rugs and other things on the floor that can make you trip. What can I do in the bedroom? Use night lights. Make sure that you have a light by your bed that is easy to reach. Do not use any sheets or blankets that are too big for your bed. They should not hang down onto the floor. Have a firm chair that has side arms. You can use this for support while you get dressed. Do not have throw rugs and other things on the floor that  can make you trip. What can I do in the kitchen? Clean up any spills right away. Avoid walking on wet floors. Keep items that you use a lot in easy-to-reach places. If you need to reach something above you, use a strong step stool that has a grab bar. Keep electrical cords out of the way. Do not use floor polish or wax that makes floors slippery. If you must use wax, use non-skid floor wax. Do not have throw rugs and other things on the floor that can make you trip. What can I do with my stairs? Do not leave any items on the stairs. Make sure that there are handrails on both sides of the stairs and use them. Fix handrails that are broken or loose. Make sure that handrails are as long as the stairways. Check any carpeting to make sure that it is firmly attached to the stairs. Fix any carpet that is loose or worn. Avoid having throw rugs at the top or bottom of the stairs.  If you do have throw rugs, attach them to the floor with carpet tape. Make sure that you have a light switch at the top of the stairs and the bottom of the stairs. If you do not have them, ask someone to add them for you. What else can I do to help prevent falls? Wear shoes that: Do not have high heels. Have rubber bottoms. Are comfortable and fit you well. Are closed at the toe. Do not wear sandals. If you use a stepladder: Make sure that it is fully opened. Do not climb a closed stepladder. Make sure that both sides of the stepladder are locked into place. Ask someone to hold it for you, if possible. Clearly mark and make sure that you can see: Any grab bars or handrails. First and last steps. Where the edge of each step is. Use tools that help you move around (mobility aids) if they are needed. These include: Canes. Walkers. Scooters. Crutches. Turn on the lights when you go into a dark area. Replace any light bulbs as soon as they burn out. Set up your furniture so you have a clear path. Avoid moving your furniture around. If any of your floors are uneven, fix them. If there are any pets around you, be aware of where they are. Review your medicines with your doctor. Some medicines can make you feel dizzy. This can increase your chance of falling. Ask your doctor what other things that you can do to help prevent falls. This information is not intended to replace advice given to you by your health care provider. Make sure you discuss any questions you have with your health care provider. Document Released: 12/11/2008 Document Revised: 07/23/2015 Document Reviewed: 03/21/2014 Elsevier Interactive Patient Education  2017 [employer].

## 2022-03-30 ENCOUNTER — Ambulatory Visit: Payer: Medicare Other | Admitting: Internal Medicine

## 2022-03-30 NOTE — Progress Notes (Deleted)
Patient ID: [name], male   DOB: 10/27/1945, 77 y.o.   MRN: [id number]  HPI: [name] is a 77 y.o.-year-old male, returning for follow-up for DM2, dx in 05-Jun-2006, insulin-dependent since Jun 04, 2016, uncontrolled, with complications (CAD, CHF, CKD, PN, s/p trans metatarsal amputation). Pt. previously saw Dr. Loanne Drilling, but last visit with me visit 3 months ago.  Interim history: No increased urination, blurry vision, nausea, chest pain.  Reviewed HbA1c: Lab Results  Component Value Date   HGBA1C 6.7 (A) 12/17/2021   HGBA1C 7.6 (H) 08/09/2021   HGBA1C 7.1 (A) 06/15/2021   HGBA1C 6.9 (A) 03/09/2021   HGBA1C 6.3 (A) 04/01/2020   HGBA1C 6.9 (H) 02/11/2020   HGBA1C 7.0 (A) 12/10/2019   HGBA1C 6.9 (A) 09/04/2019   HGBA1C 6.8 (A) 06/05/2019   HGBA1C 7.2 (A) 03/06/2019   Pt is on a regimen of: - Lantus 14 units in am - Humalog 01-08-09 units before or after meals >> moved 15 min before meals - Victoza 1.8 mg at night  >> in am Metformin caused nausea. He declines a V-Go device due to cost.  Pt checks his sugars >4x a day - Libre CGM (receiver):  Previously:   Lowest sugar was 40s; he has hypoglycemia awareness at 70.  Highest sugar was upper 200s.  Glucometer:?  - + CKD, last BUN/creatinine:  Lab Results  Component Value Date   BUN 21 08/09/2021   BUN 17 02/14/2020   CREATININE 1.12 08/09/2021   CREATININE 1.36 (H) 02/14/2020  He is not on ACE inhibitor or ARB.  -+ HL; last set of lipids: Lab Results  Component Value Date   CHOL 136 08/09/2021   HDL 38.30 (L) 08/09/2021   LDLCALC 60 08/09/2021   LDLDIRECT 134.0 10/17/2017   TRIG 186.0 (H) 08/09/2021   CHOLHDL 4 08/09/2021  On Crestor 20 mg daily.  - last eye exam was in 11/2021. No DR reportedly.   - no numbness and tingling in his feet.  Last foot exam 08/09/2021.  He has a history of nephrolithiasis, hypogonadism.  ROS: + see HPI No increased urination, blurry vision, nausea, chest pain.   Past Medical  History:  Diagnosis Date   CALLUS, RIGHT FOOT 12/08/2009   Cardiac arrest [facility])    Chronic systolic heart failure ([place]) 11/03/2009   DIABETES MELLITUS, TYPE II 12/05/2006   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/2009   HYPERLIPIDEMIA 11/25/2006   HYPOGONADISM, MALE 12/05/2006   Implantable cardiac defibrillator MDT 09/22/2008   Change out feb 2011, 2015   Ischemic cardiomyopathy 09/22/2008   DES CX and RCA  70% residual LAD  Done in [place]; EF 30%;; myoviewq 06-05-06 no ischemia   Kidney stones    Long term (current) use of anticoagulants 04/25/2010   Myocardial infarction [facility]) 2004-06-04   "I died and they brought me back"   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   NEPHROLITHIASIS, HX OF 02/25/2008   Persistent atrial fibrillation ([place]) 11/25/2006   Inapp shock 2010-06-05 AF VR >220 recurrent   Past Surgical History:  Procedure Laterality Date   CARDIAC CATHETERIZATION     feburary 04-Jun-2012   CARDIAC DEFIBRILLATOR PLACEMENT     Guidant Vitality [name]   CARDIOVERSION N/A 04/25/2012   Procedure: CARDIOVERSION;  Surgeon: Darlin Coco, MD;  Location: [facility] ENDOSCOPY;  Service: Cardiovascular;  Laterality: N/A;   CARDIOVERSION N/A 05/21/2012   Procedure: CARDIOVERSION;  Surgeon: Deboraha Sprang, MD;  Location: [facility];  Service: Cardiovascular;  Laterality: N/A;   CATARACT EXTRACTION  EYE SURGERY     cataracts   IMPLANTABLE CARDIOVERTER DEFIBRILLATOR (ICD) GENERATOR CHANGE N/A 02/05/2014   Procedure: ICD GENERATOR CHANGE;  Surgeon: Deboraha Sprang, MD;  Location: [facility] CATH LAB;  Service: Cardiovascular;  Laterality: N/A;   PTCA     TONSILLECTOMY     TONSILLECTOMY     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   Procedure: TRANSMETATARSAL AMPUTATION RIGHT;  Surgeon: Marty Heck, MD;  Location: [facility] OR;  Service: Vascular;  Laterality: Right;   Social History   Socioeconomic History   Marital status: Divorced    Spouse name: Not on file   Number of children: 1   Years of education: 16    Highest education level: Not on file  Occupational History   Occupation: Education officer, museum business and show production    Employer: [employer]  Tobacco Use   Smoking status: Never   Smokeless tobacco: Never  Vaping Use   Vaping Use: Never used  Substance and Sexual Activity   Alcohol use: Yes    Alcohol/week: 0.0 standard drinks of alcohol    Comment: summer    Drug use: No   Sexual activity: Yes    Partners: Female  Other Topics Concern   Not on file  Social History Narrative   HSG, College grad. Married - divorced. 1 daughter. owner/operator Research officer, trade union business and show production   Social Determinants of Health   Financial Resource Strain: Low Risk  (03/18/2022)   Overall Financial Resource Strain (CARDIA)    Difficulty of Paying Living Expenses: Not hard at all  Food Insecurity: No Food Insecurity (03/18/2022)   Hunger Vital Sign    Worried About Running Out of Food in the Last Year: Never true    Ran Out of Food in the Last Year: Never true  Transportation Needs: No Transportation Needs (03/18/2022)   PRAPARE - Hydrologist (Medical): No    Lack of Transportation (Non-Medical): No  Physical Activity: Inactive (03/18/2022)   Exercise Vital Sign    Days of Exercise per Week: 0 days    Minutes of Exercise per Session: 0 min  Stress: No Stress Concern Present (03/18/2022)   [place]    Feeling of Stress : Not at all  Social Connections: Unknown (03/18/2022)   Social Connection and Isolation Panel [NHANES]    Frequency of Communication with Friends and Family: More than three times a week    Frequency of Social Gatherings with Friends and Family: More than three times a week    Attends Religious Services: Patient refused    Active Member of Clubs or Organizations: No    Attends Archivist Meetings: Patient refused    Marital Status: Divorced  Arboriculturist Violence: Not At Risk (03/18/2022)   Humiliation, Afraid, Rape, and Kick questionnaire    Fear of Current or Ex-Partner: No    Emotionally Abused: No    Physically Abused: No    Sexually Abused: No   Current Outpatient Medications on File Prior to Visit  Medication Sig Dispense Refill   aspirin EC 81 MG tablet Take 81 mg by mouth daily. Swallow whole.     Continuous Blood Gluc Receiver (FREESTYLE LIBRE 14 DAY READER) DEVI USE AS DIRECTED 1 each 0   Continuous Blood Gluc Receiver (FREESTYLE LIBRE 2 READER) DEVI Use as instructure to check blood sugar 1 each 0   Continuous Blood Gluc Sensor (FREESTYLE  LIBRE 2 SENSOR) MISC Apply new sensore every 14 days 6 each 3   dofetilide (TIKOSYN) 500 MCG capsule TAKE 1 CAPSULE BY MOUTH 2 TIMES DAILY (Patient taking differently: Take 500 mcg by mouth 2 (two) times daily.) 60 capsule 0   doxycycline (VIBRA-TABS) 100 MG tablet Take 1 tablet (100 mg total) by mouth 2 (two) times daily. 20 tablet 0   eplerenone (INSPRA) 25 MG tablet Take 25 mg by mouth daily.     FLUoxetine (PROZAC) 10 MG capsule Take 1 capsule (10 mg total) by mouth daily. 90 capsule 3   fluticasone (FLONASE) 50 MCG/ACT nasal spray USE 2 SPRAYS IN EACH NOSTRIL EVERY DAY (Patient taking differently: Place 2 sprays into both nostrils daily.) 16 g 5   Glucose Blood (FREESTYLE PRECISION NEO TEST VI) by In Vitro route.     glucose blood (ONE TOUCH ULTRA TEST) test strip 1 each by Other route 2 (two) times daily. 200 each 2   insulin glargine (LANTUS SOLOSTAR) 100 UNIT/ML Solostar Pen Inject 14 Units into the skin daily. 15 mL 11   insulin lispro (ADMELOG SOLOSTAR) 100 UNIT/ML KwikPen 3 times a day (just before each meal) 01-08-09 units. 45 mL 3   Lancets MISC Use as directed up to 4 times per day 100 each 11   liraglutide (VICTOZA) 18 MG/3ML SOPN Inject 1.8 mg into the skin every evening.     loratadine (CLARITIN) 10 MG tablet Take 10 mg by mouth daily.     LORazepam (ATIVAN) 1 MG tablet  TAKE 1/2 TABLET BY MOUTH EVERY DAY AS NEEDED FOR ANXIETY (Patient taking differently: Take 0.5 mg by mouth 2 (two) times daily as needed for anxiety or sleep.) 45 tablet 1   Magnesium Oxide (MAG-OX 400 PO) Take 400 mg by mouth 2 (two) times daily.     metoprolol (TOPROL-XL) 200 MG 24 hr tablet Take 200 mg by mouth daily.     Multiple Vitamins-Minerals (MULTIVITAMIN PO) Take 1 tablet by mouth daily.     NITROSTAT 0.4 MG SL tablet Place 1 tablet (0.4 mg total) under the tongue every 5 (five) minutes as needed for chest pain. 25 tablet 5   rosuvastatin (CRESTOR) 20 MG tablet TAKE 1 TABLET BY MOUTH EVERY DAY (Patient taking differently: Take 20 mg by mouth every evening.) 90 tablet 0   ULTICARE MICRO PEN NEEDLES 32G X 4 MM MISC USE DAILY AS DIRECTED 100 each 3   XARELTO 20 MG TABS tablet TAKE 1 TABLET BY MOUTH EVERY DAY (Patient taking differently: Take 20 mg by mouth daily with supper.) 30 tablet 5   zolpidem (AMBIEN) 5 MG tablet TAKE 1 OR 2 TABLETS BY MOUTH NIGHTLY AT BEDTIME AS NEEDED (Patient taking differently: Take 10 mg by mouth at bedtime as needed for sleep.) 30 tablet 0   No current facility-administered medications on file prior to visit.   Allergies  Allergen Reactions   Salmon [Fish Allergy] Other (See Comments)    Only canned/ not fresh (probably preservatives).    Sulfa Antibiotics     Not sure has allergyu to this Only canned/ not fresh (probably preservatives).    Metformin And Related Other (See Comments)    diarrhea   Family History  Problem Relation Age of Onset   Diabetes Maternal Grandmother    Hyperlipidemia Maternal Grandmother    Breast cancer Mother    Cancer Neg Hx    COPD Neg Hx    Heart disease Neg Hx    PE: There  were no vitals taken for this visit. Wt Readings from Last 3 Encounters:  03/18/22 182 lb (82.6 kg)  12/17/21 193 lb 12.8 oz (87.9 kg)  08/09/21 188 lb (85.3 kg)   Constitutional: overweight, in NAD Eyes: no exophthalmos ENT: no thyromegaly,  no cervical lymphadenopathy Cardiovascular: RRR, No MRG Respiratory: CTA B Musculoskeletal: + deformities: Transmetatarsal amputation Skin: no rashes Neurological: no tremor with outstretched hands  ASSESSMENT: 1. DM2, insulin-dependent, uncontrolled, with complications - CAD, s/p AMI - ICM, with CHF, s/p ICD - Persistent A-fib - CKD - PN - Transmetatarsal amputation  2. HL  PLAN:  1. Patient with longstanding, uncontrolled, diabetes, on injectable antidiabetic regimen with basal/bolus insulin and daily GLP-1 receptor agonist.  At last visit, HbA1c was better, at 6.7%.  We discussed about the importance of taking the mealtime insulin approximately 10 to 15 minutes before each meal and also that if he needed to take Humalog after the meal, to reduce the dose by approximately 40%.  Upon questioning, he was taking Victoza at night and we moved it to the morning. CGM interpretation: -At today's visit, we reviewed his CGM downloads: It appears that *** of values are in target range (goal >70%), while *** are higher than 180 (goal <25%), and *** are lower than 70 (goal <4%).  The calculated average blood sugar is ***.  The projected HbA1c for the next 3 months (GMI) is ***. -Reviewing the CGM trends, *** - I suggested to:  Patient Instructions  Please continue: - Victoza 1.8 mg before breakfast - Lantus 14 units in am - Humalog 10-11 units 15 min before the 3 meals If you take Humalog after a meal, may need to reduce the dose to 6 units.  Please return in 3-4 months.  - we checked his HbA1c: 7%  - advised to check sugars at different times of the day - 4x a day, rotating check times - advised for yearly eye exams >> he is UTD - return to clinic in 3-4 months  2. HL -Reviewed latest lipid panel from 07/2021: LDL slightly above goal of less than 55 due to cardiovascular disease, triglycerides slightly high, HDL slightly low Lab Results  Component Value Date   CHOL 136 08/09/2021    HDL 38.30 (L) 08/09/2021   LDLCALC 60 08/09/2021   LDLDIRECT 134.0 10/17/2017   TRIG 186.0 (H) 08/09/2021   CHOLHDL 4 08/09/2021  -Continues Crestor 20 mg daily without side effects  Philemon Kingdom, MD PhD [facility] Endocrinology

## 2022-03-31 DIAGNOSIS — Z7984 Long term (current) use of oral hypoglycemic drugs: Secondary | ICD-10-CM | POA: Diagnosis not present

## 2022-03-31 DIAGNOSIS — Z882 Allergy status to sulfonamides status: Secondary | ICD-10-CM | POA: Diagnosis not present

## 2022-03-31 DIAGNOSIS — I255 Ischemic cardiomyopathy: Secondary | ICD-10-CM | POA: Diagnosis not present

## 2022-03-31 DIAGNOSIS — I472 Ventricular tachycardia, unspecified: Secondary | ICD-10-CM | POA: Diagnosis not present

## 2022-03-31 DIAGNOSIS — Z7901 Long term (current) use of anticoagulants: Secondary | ICD-10-CM | POA: Diagnosis not present

## 2022-03-31 DIAGNOSIS — I4891 Unspecified atrial fibrillation: Secondary | ICD-10-CM | POA: Diagnosis not present

## 2022-03-31 DIAGNOSIS — I110 Hypertensive heart disease with heart failure: Secondary | ICD-10-CM | POA: Diagnosis not present

## 2022-03-31 DIAGNOSIS — I5022 Chronic systolic (congestive) heart failure: Secondary | ICD-10-CM | POA: Diagnosis not present

## 2022-03-31 DIAGNOSIS — I2510 Atherosclerotic heart disease of native coronary artery without angina pectoris: Secondary | ICD-10-CM | POA: Diagnosis not present

## 2022-03-31 DIAGNOSIS — Z794 Long term (current) use of insulin: Secondary | ICD-10-CM | POA: Diagnosis not present

## 2022-03-31 DIAGNOSIS — I493 Ventricular premature depolarization: Secondary | ICD-10-CM | POA: Diagnosis not present

## 2022-03-31 DIAGNOSIS — Z950 Presence of cardiac pacemaker: Secondary | ICD-10-CM | POA: Diagnosis not present

## 2022-03-31 DIAGNOSIS — E785 Hyperlipidemia, unspecified: Secondary | ICD-10-CM | POA: Diagnosis not present

## 2022-03-31 DIAGNOSIS — E119 Type 2 diabetes mellitus without complications: Secondary | ICD-10-CM | POA: Diagnosis not present

## 2022-03-31 DIAGNOSIS — Z7985 Long-term (current) use of injectable non-insulin antidiabetic drugs: Secondary | ICD-10-CM | POA: Diagnosis not present

## 2022-03-31 DIAGNOSIS — I252 Old myocardial infarction: Secondary | ICD-10-CM | POA: Diagnosis not present

## 2022-03-31 DIAGNOSIS — Z7982 Long term (current) use of aspirin: Secondary | ICD-10-CM | POA: Diagnosis not present

## 2022-03-31 DIAGNOSIS — Z91018 Allergy to other foods: Secondary | ICD-10-CM | POA: Diagnosis not present

## 2022-04-01 ENCOUNTER — Encounter: Payer: Self-pay | Admitting: Internal Medicine

## 2022-04-01 ENCOUNTER — Ambulatory Visit (INDEPENDENT_AMBULATORY_CARE_PROVIDER_SITE_OTHER): Payer: Medicare Other | Admitting: Internal Medicine

## 2022-04-01 VITALS — BP 110/66 | HR 84 | Ht 70.0 in | Wt 191.0 lb

## 2022-04-01 DIAGNOSIS — E785 Hyperlipidemia, unspecified: Secondary | ICD-10-CM | POA: Diagnosis not present

## 2022-04-01 DIAGNOSIS — E1159 Type 2 diabetes mellitus with other circulatory complications: Secondary | ICD-10-CM

## 2022-04-01 DIAGNOSIS — E1165 Type 2 diabetes mellitus with hyperglycemia: Secondary | ICD-10-CM | POA: Diagnosis not present

## 2022-04-01 LAB — POCT GLYCOSYLATED HEMOGLOBIN (HGB A1C): Hemoglobin A1C: 6.6 % — AB (ref 4.0–5.6)

## 2022-04-01 NOTE — Addendum Note (Signed)
Addended by: Jefferson Fuel on: 04/01/2022 09:08 AM   Modules accepted: Orders

## 2022-04-01 NOTE — Patient Instructions (Signed)
Please continue: - Victoza 1.8 mg before breakfast - Lantus 14 units in am - Humalog (10)-(10)-(5-9) units 15 min before the 3 meals If you take Humalog after a meal, may need to reduce the dose to 5-6 units.  Please return in 4 months.

## 2022-04-01 NOTE — Progress Notes (Signed)
Patient ID: [name], male   DOB: Mar 11, 1945, 77 y.o.   MRN: [id number]  HPI: [name] is a 77 y.o.-year-old male, returning for follow-up for DM2, dx in 2006/06/04, insulin-dependent since 2016/06/03, uncontrolled, with complications (CAD, CHF, CKD, PN, s/p trans metatarsal amputation). Pt. previously saw Dr. Loanne Drilling, but last visit with me visit 3 months ago.  Interim history: No increased urination, nausea, chest pain.  He does mention blurry vision.  Also, depression.  Reviewed HbA1c: Lab Results  Component Value Date   HGBA1C 6.7 (A) 12/17/2021   HGBA1C 7.6 (H) 08/09/2021   HGBA1C 7.1 (A) 06/15/2021   HGBA1C 6.9 (A) 03/09/2021   HGBA1C 6.3 (A) 04/01/2020   HGBA1C 6.9 (H) 02/11/2020   HGBA1C 7.0 (A) 12/10/2019   HGBA1C 6.9 (A) 09/04/2019   HGBA1C 6.8 (A) 06/05/2019   HGBA1C 7.2 (A) 03/06/2019   Pt is on a regimen of: - Lantus 14 units in am - Humalog 01-08-09 units before or after meals >> moved 15 min before meals >> 10-10-(5-9) units before meals - Victoza 1.8 mg at night  >> in am Metformin caused nausea. He declines a V-Go device due to cost.  Pt checks his sugars >4x a day - Libre CGM (receiver):  Previously:   Lowest sugar was 40s >> 60 >> 60; he has hypoglycemia awareness at 70.  Highest sugar was upper 200s >> 200.  Glucometer:?  - + CKD, last BUN/creatinine:  Lab Results  Component Value Date   BUN 21 08/09/2021   BUN 17 02/14/2020   CREATININE 1.12 08/09/2021   CREATININE 1.36 (H) 02/14/2020  He is not on ACE inhibitor or ARB.  -+ HL; last set of lipids: Lab Results  Component Value Date   CHOL 136 08/09/2021   HDL 38.30 (L) 08/09/2021   LDLCALC 60 08/09/2021   LDLDIRECT 134.0 10/17/2017   TRIG 186.0 (H) 08/09/2021   CHOLHDL 4 08/09/2021  On Crestor 20 mg daily.  - last eye exam was in 11/2021. No DR reportedly.   - no numbness and tingling in his feet.  Last foot exam 08/09/2021.  He has a history of nephrolithiasis, hypogonadism.  ROS: +  see HPI  Past Medical History:  Diagnosis Date   CALLUS, RIGHT FOOT 12/08/2009   Cardiac arrest [facility])    Chronic systolic heart failure ([place]) 11/03/2009   DIABETES MELLITUS, TYPE II 12/05/2006   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/2009   HYPERLIPIDEMIA 11/25/2006   HYPOGONADISM, MALE 12/05/2006   Implantable cardiac defibrillator MDT 09/22/2008   Change out feb 2011, 2015   Ischemic cardiomyopathy 09/22/2008   DES CX and RCA  70% residual LAD  Done in [place]; EF 30%;; myoviewq 2006/06/04 no ischemia   Kidney stones    Long term (current) use of anticoagulants 04/25/2010   Myocardial infarction [facility]) June 03, 2004   "I died and they brought me back"   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   NEPHROLITHIASIS, HX OF 02/25/2008   Persistent atrial fibrillation ([place]) 11/25/2006   Inapp shock 06/04/10 AF VR >220 recurrent   Past Surgical History:  Procedure Laterality Date   CARDIAC CATHETERIZATION     feburary 2012-06-03   CARDIAC DEFIBRILLATOR PLACEMENT     Guidant Vitality [name]   CARDIOVERSION N/A 04/25/2012   Procedure: CARDIOVERSION;  Surgeon: Darlin Coco, MD;  Location: [facility] ENDOSCOPY;  Service: Cardiovascular;  Laterality: N/A;   CARDIOVERSION N/A 05/21/2012   Procedure: CARDIOVERSION;  Surgeon: Deboraha Sprang, MD;  Location: [place];  Service: Cardiovascular;  Laterality: N/A;   CATARACT EXTRACTION     EYE SURGERY     cataracts   IMPLANTABLE CARDIOVERTER DEFIBRILLATOR (ICD) GENERATOR CHANGE N/A 02/05/2014   Procedure: ICD GENERATOR CHANGE;  Surgeon: Deboraha Sprang, MD;  Location: [facility] CATH LAB;  Service: Cardiovascular;  Laterality: N/A;   PTCA     TONSILLECTOMY     TONSILLECTOMY     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   Procedure: TRANSMETATARSAL AMPUTATION RIGHT;  Surgeon: Marty Heck, MD;  Location[facility] OR;  Service: Vascular;  Laterality: Right;   Social History   Socioeconomic History   Marital status: Divorced    Spouse name: Not on file   Number of children: 1    Years of education: 16   Highest education level: Not on file  Occupational History   Occupation: Education officer, museum business and show production    Employer: [employer]  Tobacco Use   Smoking status: Never   Smokeless tobacco: Never  Vaping Use   Vaping Use: Never used  Substance and Sexual Activity   Alcohol use: Yes    Alcohol/week: 0.0 standard drinks of alcohol    Comment: summer    Drug use: No   Sexual activity: Yes    Partners: Female  Other Topics Concern   Not on file  Social History Narrative   HSG, College grad. Married - divorced. 1 daughter. owner/operator Research officer, trade union business and show production   Social Determinants of Health   Financial Resource Strain: Low Risk  (03/18/2022)   Overall Financial Resource Strain (CARDIA)    Difficulty of Paying Living Expenses: Not hard at all  Food Insecurity: No Food Insecurity (03/18/2022)   Hunger Vital Sign    Worried About Running Out of Food in the Last Year: Never true    Ran Out of Food in the Last Year: Never true  Transportation Needs: No Transportation Needs (03/18/2022)   PRAPARE - Hydrologist (Medical): No    Lack of Transportation (Non-Medical): No  Physical Activity: Inactive (03/18/2022)   Exercise Vital Sign    Days of Exercise per Week: 0 days    Minutes of Exercise per Session: 0 min  Stress: No Stress Concern Present (03/18/2022)   [place]    Feeling of Stress : Not at all  Social Connections: Unknown (03/18/2022)   Social Connection and Isolation Panel [NHANES]    Frequency of Communication with Friends and Family: More than three times a week    Frequency of Social Gatherings with Friends and Family: More than three times a week    Attends Religious Services: Patient refused    Active Member of Clubs or Organizations: No    Attends Archivist Meetings: Patient refused    Marital  Status: Divorced  Human resources officer Violence: Not At Risk (03/18/2022)   Humiliation, Afraid, Rape, and Kick questionnaire    Fear of Current or Ex-Partner: No    Emotionally Abused: No    Physically Abused: No    Sexually Abused: No   Current Outpatient Medications on File Prior to Visit  Medication Sig Dispense Refill   aspirin EC 81 MG tablet Take 81 mg by mouth daily. Swallow whole.     Continuous Blood Gluc Receiver (FREESTYLE LIBRE 14 DAY READER) DEVI USE AS DIRECTED 1 each 0   Continuous Blood Gluc Receiver (FREESTYLE LIBRE 2 READER) DEVI Use as instructure to  check blood sugar 1 each 0   Continuous Blood Gluc Sensor (FREESTYLE LIBRE 2 SENSOR) MISC Apply new sensore every 14 days 6 each 3   dofetilide (TIKOSYN) 500 MCG capsule TAKE 1 CAPSULE BY MOUTH 2 TIMES DAILY (Patient taking differently: Take 500 mcg by mouth 2 (two) times daily.) 60 capsule 0   doxycycline (VIBRA-TABS) 100 MG tablet Take 1 tablet (100 mg total) by mouth 2 (two) times daily. 20 tablet 0   eplerenone (INSPRA) 25 MG tablet Take 25 mg by mouth daily.     FLUoxetine (PROZAC) 10 MG capsule Take 1 capsule (10 mg total) by mouth daily. 90 capsule 3   fluticasone (FLONASE) 50 MCG/ACT nasal spray USE 2 SPRAYS IN EACH NOSTRIL EVERY DAY (Patient taking differently: Place 2 sprays into both nostrils daily.) 16 g 5   Glucose Blood (FREESTYLE PRECISION NEO TEST VI) by In Vitro route.     glucose blood (ONE TOUCH ULTRA TEST) test strip 1 each by Other route 2 (two) times daily. 200 each 2   insulin glargine (LANTUS SOLOSTAR) 100 UNIT/ML Solostar Pen Inject 14 Units into the skin daily. 15 mL 11   insulin lispro (ADMELOG SOLOSTAR) 100 UNIT/ML KwikPen 3 times a day (just before each meal) 01-08-09 units. 45 mL 3   Lancets MISC Use as directed up to 4 times per day 100 each 11   liraglutide (VICTOZA) 18 MG/3ML SOPN Inject 1.8 mg into the skin every evening.     loratadine (CLARITIN) 10 MG tablet Take 10 mg by mouth daily.      LORazepam (ATIVAN) 1 MG tablet TAKE 1/2 TABLET BY MOUTH EVERY DAY AS NEEDED FOR ANXIETY (Patient taking differently: Take 0.5 mg by mouth 2 (two) times daily as needed for anxiety or sleep.) 45 tablet 1   Magnesium Oxide (MAG-OX 400 PO) Take 400 mg by mouth 2 (two) times daily.     metoprolol (TOPROL-XL) 200 MG 24 hr tablet Take 200 mg by mouth daily.     Multiple Vitamins-Minerals (MULTIVITAMIN PO) Take 1 tablet by mouth daily.     NITROSTAT 0.4 MG SL tablet Place 1 tablet (0.4 mg total) under the tongue every 5 (five) minutes as needed for chest pain. 25 tablet 5   rosuvastatin (CRESTOR) 20 MG tablet TAKE 1 TABLET BY MOUTH EVERY DAY (Patient taking differently: Take 20 mg by mouth every evening.) 90 tablet 0   ULTICARE MICRO PEN NEEDLES 32G X 4 MM MISC USE DAILY AS DIRECTED 100 each 3   XARELTO 20 MG TABS tablet TAKE 1 TABLET BY MOUTH EVERY DAY (Patient taking differently: Take 20 mg by mouth daily with supper.) 30 tablet 5   zolpidem (AMBIEN) 5 MG tablet TAKE 1 OR 2 TABLETS BY MOUTH NIGHTLY AT BEDTIME AS NEEDED (Patient taking differently: Take 10 mg by mouth at bedtime as needed for sleep.) 30 tablet 0   No current facility-administered medications on file prior to visit.   Allergies  Allergen Reactions   Salmon [Fish Allergy] Other (See Comments)    Only canned/ not fresh (probably preservatives).    Sulfa Antibiotics     Not sure has allergyu to this Only canned/ not fresh (probably preservatives).    Metformin And Related Other (See Comments)    diarrhea   Family History  Problem Relation Age of Onset   Diabetes Maternal Grandmother    Hyperlipidemia Maternal Grandmother    Breast cancer Mother    Cancer Neg Hx    COPD Neg  Hx    Heart disease Neg Hx    PE: BP 110/66 (BP Location: Left Arm, Patient Position: Sitting, Cuff Size: Small)   Pulse 84   Ht '5\' 10"'$  (1.778 m)   Wt 191 lb (86.6 kg)   SpO2 99%   BMI 27.41 kg/m  Wt Readings from Last 3 Encounters:  04/01/22 191 lb  (86.6 kg)  03/18/22 182 lb (82.6 kg)  12/17/21 193 lb 12.8 oz (87.9 kg)   Constitutional: overweight, in NAD Eyes: no exophthalmos ENT: no thyromegaly, no cervical lymphadenopathy Cardiovascular: RRR, No MRG Respiratory: CTA B Musculoskeletal: + deformities: Transmetatarsal amputation Skin: no rashes Neurological: no tremor with outstretched hands  ASSESSMENT: 1. DM2, insulin-dependent, uncontrolled, with complications - CAD, s/p AMI - ICM, with CHF, s/p ICD - Persistent A-fib - CKD - PN - Transmetatarsal amputation  2. HL  PLAN:  1. Patient with longstanding, uncontrolled, type 2 diabetes, on injectable antidiabetic regimen with basal/bolus insulin and daily GLP-1 receptor agonist.  At last visit, HbA1c was improved, 6.7%.  We discussed about the importance of taking the mealtime insulin approximately 10-15 minutes before each meal and also, if he needed to take Humalog after the meal, to reduce the dose by approximately 40%.  Upon questioning, he was taking Victoza at night and we moved it to the morning. CGM interpretation: -At today's visit, we reviewed his CGM downloads: It appears that 87% of values are in target range (goal >70%), while 13% are higher than 180 (goal <25%), and 0% are lower than 70 (goal <4%).  The calculated average blood sugar is 148.  The projected HbA1c for the next 3 months (GMI) is 6.9%. -Reviewing the CGM trends, sugars are fluctuating within the target range, with only few occasional hyperglycemic exceptions especially after breakfast.  Overall, he appears to be doing better compared to last visit, trying to inject his insulin 15 minutes before each meal.  Therefore, at today's visit, I did not suggest a change in regimen.  He does mention that he had few values in the 60s but I do not see any in the last 2 weeks.  I advised him that if this happens again, to let me know, as we may need to reduce his insulin doses at that time. - I suggested to:  Patient  Instructions  Please continue: - Victoza 1.8 mg before breakfast - Lantus 14 units in am - Humalog (10)-(10)-(5-9) units 15 min before the 3 meals If you take Humalog after a meal, may need to reduce the dose to 5-6 units.  Please return in 4 months.  - we checked his HbA1c: 6.6% (lower) - advised to check sugars at different times of the day - 4x a day, rotating check times - advised for yearly eye exams >> he is UTD - return to clinic in 3-4 months  2. HL -Reviewed latest lipid panel from 07/2021: LDL slightly above goal of less than 55, triglycerides slightly high, HDL low: Lab Results  Component Value Date   CHOL 136 08/09/2021   HDL 38.30 (L) 08/09/2021   LDLCALC 60 08/09/2021   LDLDIRECT 134.0 10/17/2017   TRIG 186.0 (H) 08/09/2021   CHOLHDL 4 08/09/2021  -He continues on Crestor 20 mg daily without side effects  Philemon Kingdom, MD PhD [facility] Endocrinology

## 2022-04-28 DIAGNOSIS — F419 Anxiety disorder, unspecified: Secondary | ICD-10-CM | POA: Diagnosis not present

## 2022-04-28 DIAGNOSIS — E1159 Type 2 diabetes mellitus with other circulatory complications: Secondary | ICD-10-CM | POA: Diagnosis not present

## 2022-04-28 DIAGNOSIS — Z794 Long term (current) use of insulin: Secondary | ICD-10-CM | POA: Diagnosis not present

## 2022-04-28 DIAGNOSIS — I5020 Unspecified systolic (congestive) heart failure: Secondary | ICD-10-CM | POA: Diagnosis not present

## 2022-05-05 DIAGNOSIS — I3481 Nonrheumatic mitral (valve) annulus calcification: Secondary | ICD-10-CM | POA: Diagnosis not present

## 2022-05-05 DIAGNOSIS — I083 Combined rheumatic disorders of mitral, aortic and tricuspid valves: Secondary | ICD-10-CM | POA: Diagnosis not present

## 2022-05-05 DIAGNOSIS — I5020 Unspecified systolic (congestive) heart failure: Secondary | ICD-10-CM | POA: Diagnosis not present

## 2022-05-05 DIAGNOSIS — I272 Pulmonary hypertension, unspecified: Secondary | ICD-10-CM | POA: Diagnosis not present

## 2022-05-18 DIAGNOSIS — I5022 Chronic systolic (congestive) heart failure: Secondary | ICD-10-CM | POA: Diagnosis not present

## 2022-05-30 DIAGNOSIS — I5042 Chronic combined systolic (congestive) and diastolic (congestive) heart failure: Secondary | ICD-10-CM | POA: Diagnosis not present

## 2022-06-01 DIAGNOSIS — K080 Exfoliation of teeth due to systemic causes: Secondary | ICD-10-CM | POA: Diagnosis not present

## 2022-06-09 DIAGNOSIS — Z95810 Presence of automatic (implantable) cardiac defibrillator: Secondary | ICD-10-CM | POA: Diagnosis not present

## 2022-06-09 DIAGNOSIS — Z4502 Encounter for adjustment and management of automatic implantable cardiac defibrillator: Secondary | ICD-10-CM | POA: Diagnosis not present

## 2022-06-13 DIAGNOSIS — L97511 Non-pressure chronic ulcer of other part of right foot limited to breakdown of skin: Secondary | ICD-10-CM | POA: Diagnosis not present

## 2022-06-13 DIAGNOSIS — Z89439 Acquired absence of unspecified foot: Secondary | ICD-10-CM | POA: Diagnosis not present

## 2022-06-27 DIAGNOSIS — L97511 Non-pressure chronic ulcer of other part of right foot limited to breakdown of skin: Secondary | ICD-10-CM | POA: Diagnosis not present

## 2022-06-27 DIAGNOSIS — Z89439 Acquired absence of unspecified foot: Secondary | ICD-10-CM | POA: Diagnosis not present

## 2022-06-30 DIAGNOSIS — I5042 Chronic combined systolic (congestive) and diastolic (congestive) heart failure: Secondary | ICD-10-CM | POA: Diagnosis not present

## 2022-07-05 ENCOUNTER — Other Ambulatory Visit: Payer: Self-pay | Admitting: Internal Medicine

## 2022-07-13 ENCOUNTER — Other Ambulatory Visit: Payer: Self-pay | Admitting: Internal Medicine

## 2022-07-18 DIAGNOSIS — L97511 Non-pressure chronic ulcer of other part of right foot limited to breakdown of skin: Secondary | ICD-10-CM | POA: Diagnosis not present

## 2022-07-18 DIAGNOSIS — Z89439 Acquired absence of unspecified foot: Secondary | ICD-10-CM | POA: Diagnosis not present

## 2022-07-21 ENCOUNTER — Encounter: Payer: Self-pay | Admitting: Internal Medicine

## 2022-07-21 ENCOUNTER — Ambulatory Visit: Payer: Medicare Other | Admitting: Internal Medicine

## 2022-07-21 VITALS — BP 122/68 | HR 85 | Ht 70.0 in | Wt 192.6 lb

## 2022-07-21 DIAGNOSIS — E1159 Type 2 diabetes mellitus with other circulatory complications: Secondary | ICD-10-CM

## 2022-07-21 DIAGNOSIS — E1165 Type 2 diabetes mellitus with hyperglycemia: Secondary | ICD-10-CM

## 2022-07-21 DIAGNOSIS — Z794 Long term (current) use of insulin: Secondary | ICD-10-CM | POA: Diagnosis not present

## 2022-07-21 DIAGNOSIS — Z7985 Long-term (current) use of injectable non-insulin antidiabetic drugs: Secondary | ICD-10-CM | POA: Diagnosis not present

## 2022-07-21 DIAGNOSIS — E1142 Type 2 diabetes mellitus with diabetic polyneuropathy: Secondary | ICD-10-CM

## 2022-07-21 DIAGNOSIS — E119 Type 2 diabetes mellitus without complications: Secondary | ICD-10-CM

## 2022-07-21 DIAGNOSIS — E0842 Diabetes mellitus due to underlying condition with diabetic polyneuropathy: Secondary | ICD-10-CM

## 2022-07-21 DIAGNOSIS — E785 Hyperlipidemia, unspecified: Secondary | ICD-10-CM

## 2022-07-21 LAB — POCT GLYCOSYLATED HEMOGLOBIN (HGB A1C): Hemoglobin A1C: 6.9 % — AB (ref 4.0–5.6)

## 2022-07-21 MED ORDER — FREESTYLE LIBRE 2 READER DEVI: 0 refills | Status: DC

## 2022-07-21 NOTE — Patient Instructions (Addendum)
Please continue: - Victoza 1.8 mg before breakfast - Lantus 14 units in am - Humalog (10)-(10)-(9-10) units 15 min before the 3 meals If you take Humalog after a meal, may need to reduce the dose to 5-6 units.  Please return in 4 months.

## 2022-07-21 NOTE — Progress Notes (Addendum)
Patient ID: [name], male   DOB: [birth year], 77 y.o.   MRN: [id number]  HPI: [name] is a 77 y.o.-year-old male, returning for follow-up for DM2, dx in Aug 22, 2006, insulin-dependent since 08-21-16, uncontrolled, with complications (CAD, CHF, CKD, PN, s/p trans metatarsal amputation). Pt. previously saw Dr. Everardo All, but last visit with me visit 3.5 months ago.  Interim history: No increased urination, nausea, chest pain.   Reviewed HbA1c: Lab Results  Component Value Date   HGBA1C 6.6 (A) 04/01/2022   HGBA1C 6.7 (A) 12/17/2021   HGBA1C 7.6 (H) 08/09/2021   HGBA1C 7.1 (A) 06/15/2021   HGBA1C 6.9 (A) 03/09/2021   HGBA1C 6.3 (A) 04/01/2020   HGBA1C 6.9 (H) 02/11/2020   HGBA1C 7.0 (A) 12/10/2019   HGBA1C 6.9 (A) 09/04/2019   HGBA1C 6.8 (A) 06/05/2019   Pt is on a regimen of: - Lantus 14 units in am - Humalog 01-08-09 units before or after meals >> moved 15 min before meals >> 10-10-(9) units before meals - Victoza 1.8 mg at night  >> in am Metformin caused nausea. He declines a V-Go device due to cost.  Pt checks his sugars >4x a day - Libre CGM (receiver):  Previously:  Previously:   Lowest sugar was 40s >> 60 >> 60 >> 60s; he has hypoglycemia awareness at 70.  Highest sugar was upper 200s >> 200 >> 200 x2.  Glucometer:?  - + CKD, last BUN/creatinine:  07/20/2022: 14/0.94, GFR 85 Lab Results  Component Value Date   BUN 21 08/09/2021   BUN 17 02/14/2020   CREATININE 1.12 08/09/2021   CREATININE 1.36 (H) 02/14/2020  He is not on ACE inhibitor or ARB.  -+ HL; last set of lipids: 1026/2023: 125/174/32/58 Lab Results  Component Value Date   CHOL 136 08/09/2021   HDL 38.30 (L) 08/09/2021   LDLCALC 60 08/09/2021   LDLDIRECT 134.0 10/17/2017   TRIG 186.0 (H) 08/09/2021   CHOLHDL 4 08/09/2021  On Crestor 20 mg daily.  - last eye exam was in 11/2021. No DR reportedly.   - no numbness and tingling in his feet.  Last foot exam 08/09/2021.  He has a history of  nephrolithiasis, hypogonadism. He is in a clinical trial  - a supplement that helps with heart condition >> improves stamina and SOB.  ROS: + see HPI  Past Medical History:  Diagnosis Date   CALLUS, RIGHT FOOT 12/08/2009   Cardiac arrest [facility])    Chronic systolic heart failure (HCC) 11/03/2009   DIABETES MELLITUS, TYPE II 12/05/2006   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/2009   HYPERLIPIDEMIA 11/25/2006   HYPOGONADISM, MALE 12/05/2006   Implantable cardiac defibrillator MDT 09/22/2008   Change out feb 2011, 2015   Ischemic cardiomyopathy 09/22/2008   DES CX and RCA  70% residual LAD  Done in [place]; EF 30%;; myoviewq 2006-08-22 no ischemia   Kidney stones    Long term (current) use of anticoagulants 04/25/2010   Myocardial infarction [facility]) 08-21-04   "I died and they brought me back"   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   NEPHROLITHIASIS, HX OF 02/25/2008   Persistent atrial fibrillation (HCC) 11/25/2006   Inapp shock 08-22-2010 AF VR >220 recurrent   Past Surgical History:  Procedure Laterality Date   CARDIAC CATHETERIZATION     feburary 2014   CARDIAC DEFIBRILLATOR PLACEMENT     Guidant Vitality [name]   CARDIOVERSION N/A 04/25/2012   Procedure: CARDIOVERSION;  Surgeon: Cassell Clement, MD;  Location: [facility] ENDOSCOPY;  Service: Cardiovascular;  Laterality: N/A;   CARDIOVERSION N/A 05/21/2012   Procedure: CARDIOVERSION;  Surgeon: Duke Salvia, MD;  Location: [facility] ENDOSCOPY;  Service: Cardiovascular;  Laterality: N/A;   CATARACT EXTRACTION     EYE SURGERY     cataracts   IMPLANTABLE CARDIOVERTER DEFIBRILLATOR (ICD) GENERATOR CHANGE N/A 02/05/2014   Procedure: ICD GENERATOR CHANGE;  Surgeon: Duke Salvia, MD;  Location: [facility] CATH LAB;  Service: Cardiovascular;  Laterality: N/A;   PTCA     TONSILLECTOMY     TONSILLECTOMY     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   Procedure: TRANSMETATARSAL AMPUTATION RIGHT;  Surgeon: Cephus Shelling, MD;  Location: [facility] OR;  Service: Vascular;   Laterality: Right;   Social History   Socioeconomic History   Marital status: Divorced    Spouse name: Not on file   Number of children: 1   Years of education: 16   Highest education level: Not on file  Occupational History   Occupation: Musician business and show production    Employer: [employer]  Tobacco Use   Smoking status: Never   Smokeless tobacco: Never  Vaping Use   Vaping Use: Never used  Substance and Sexual Activity   Alcohol use: Yes    Alcohol/week: 0.0 standard drinks of alcohol    Comment: summer    Drug use: No   Sexual activity: Yes    Partners: Female  Other Topics Concern   Not on file  Social History Narrative   HSG, College grad. Married - divorced. 1 daughter. owner/operator Optician, dispensing business and show production   Social Determinants of Health   Financial Resource Strain: Low Risk  (03/18/2022)   Overall Financial Resource Strain (CARDIA)    Difficulty of Paying Living Expenses: Not hard at all  Food Insecurity: No Food Insecurity (03/18/2022)   Hunger Vital Sign    Worried About Running Out of Food in the Last Year: Never true    Ran Out of Food in the Last Year: Never true  Transportation Needs: No Transportation Needs (03/18/2022)   PRAPARE - Administrator, Civil Service (Medical): No    Lack of Transportation (Non-Medical): No  Physical Activity: Inactive (03/18/2022)   Exercise Vital Sign    Days of Exercise per Week: 0 days    Minutes of Exercise per Session: 0 min  Stress: No Stress Concern Present (03/18/2022)   [employer] of Occupational Health - Occupational Stress Questionnaire    Feeling of Stress : Not at all  Social Connections: Unknown (03/18/2022)   Social Connection and Isolation Panel [NHANES]    Frequency of Communication with Friends and Family: More than three times a week    Frequency of Social Gatherings with Friends and Family: More than three times a week    Attends  Religious Services: Patient declined    Database administrator or Organizations: No    Attends Banker Meetings: Patient declined    Marital Status: Divorced  Catering manager Violence: Not At Risk (03/18/2022)   Humiliation, Afraid, Rape, and Kick questionnaire    Fear of Current or Ex-Partner: No    Emotionally Abused: No    Physically Abused: No    Sexually Abused: No   Current Outpatient Medications on File Prior to Visit  Medication Sig Dispense Refill   aspirin EC 81 MG tablet Take 81 mg by mouth daily. Swallow whole.     Continuous Blood Gluc Receiver (FREESTYLE [place]  2 READER) DEVI Use as instructure to check blood sugar 1 each 0   Continuous Blood Gluc Sensor (FREESTYLE LIBRE 2 SENSOR) MISC Apply new sensore every 14 days 6 each 3   dofetilide (TIKOSYN) 500 MCG capsule TAKE 1 CAPSULE BY MOUTH 2 TIMES DAILY (Patient taking differently: Take 500 mcg by mouth 2 (two) times daily.) 60 capsule 0   eplerenone (INSPRA) 25 MG tablet Take 25 mg by mouth daily.     FLUoxetine (PROZAC) 10 MG capsule Take 1 capsule (10 mg total) by mouth daily. Annual appt due in June must see provider for future refills 30 capsule 0   fluticasone (FLONASE) 50 MCG/ACT nasal spray USE 2 SPRAYS IN EACH NOSTRIL EVERY DAY (Patient taking differently: Place 2 sprays into both nostrils daily.) 16 g 5   Glucose Blood (FREESTYLE PRECISION NEO TEST VI) by In Vitro route.     glucose blood (ONE TOUCH ULTRA TEST) test strip 1 each by Other route 2 (two) times daily. 200 each 2   insulin glargine (LANTUS SOLOSTAR) 100 UNIT/ML Solostar Pen Inject 14 Units into the skin daily. 15 mL 11   insulin lispro (ADMELOG SOLOSTAR) 100 UNIT/ML KwikPen 3 times a day (just before each meal) 01-08-09 units. 45 mL 3   Lancets MISC Use as directed up to 4 times per day 100 each 11   liraglutide (VICTOZA) 18 MG/3ML SOPN Inject 1.8 mg into the skin every evening.     loratadine (CLARITIN) 10 MG tablet Take 10 mg by mouth daily.      LORazepam (ATIVAN) 1 MG tablet TAKE 1/2 TABLET BY MOUTH EVERY DAY AS NEEDED FOR ANXIETY (Patient taking differently: Take 0.5 mg by mouth 2 (two) times daily as needed for anxiety or sleep.) 45 tablet 1   Magnesium Oxide (MAG-OX 400 PO) Take 400 mg by mouth 2 (two) times daily.     metoprolol (TOPROL-XL) 200 MG 24 hr tablet Take 200 mg by mouth daily.     Multiple Vitamins-Minerals (MULTIVITAMIN PO) Take 1 tablet by mouth daily.     NITROSTAT 0.4 MG SL tablet Place 1 tablet (0.4 mg total) under the tongue every 5 (five) minutes as needed for chest pain. 25 tablet 5   rosuvastatin (CRESTOR) 20 MG tablet TAKE 1 TABLET BY MOUTH EVERY DAY (Patient taking differently: Take 20 mg by mouth every evening.) 90 tablet 0   ULTICARE MICRO PEN NEEDLES 32G X 4 MM MISC USE DAILY AS DIRECTED 100 each 3   XARELTO 20 MG TABS tablet TAKE 1 TABLET BY MOUTH EVERY DAY (Patient taking differently: Take 20 mg by mouth daily with supper.) 30 tablet 5   zolpidem (AMBIEN) 5 MG tablet TAKE 1 OR 2 TABLETS BY MOUTH NIGHTLY AT BEDTIME AS NEEDED (Patient taking differently: Take 10 mg by mouth at bedtime as needed for sleep.) 30 tablet 0   No current facility-administered medications on file prior to visit.   Allergies  Allergen Reactions   Salmon [Fish Allergy] Other (See Comments)    Only canned/ not fresh (probably preservatives).    Sulfa Antibiotics     Not sure has allergyu to this Only canned/ not fresh (probably preservatives).    Metformin And Related Other (See Comments)    diarrhea   Family History  Problem Relation Age of Onset   Diabetes Maternal Grandmother    Hyperlipidemia Maternal Grandmother    Breast cancer Mother    Cancer Neg Hx    COPD Neg Hx  Heart disease Neg Hx    PE: BP 122/68 (BP Location: Left Arm, Patient Position: Sitting, Cuff Size: Normal)   Pulse 85   Ht 5\' 10"  (1.778 m)   Wt 192 lb 9.6 oz (87.4 kg)   SpO2 97%   BMI 27.64 kg/m  Wt Readings from Last 3 Encounters:   07/21/22 192 lb 9.6 oz (87.4 kg)  04/01/22 191 lb (86.6 kg)  03/18/22 182 lb (82.6 kg)   Constitutional: overweight, in NAD Eyes: no exophthalmos ENT: no thyromegaly, no cervical lymphadenopathy Cardiovascular: RRR, No MRG Respiratory: CTA B Musculoskeletal: + deformities: Transmetatarsal amputation Skin: no rashes Neurological: no tremor with outstretched hands Diabetic Foot Exam - Simple   Simple Foot Form Diabetic Foot exam was performed with the following findings: Yes 07/21/2022 11:21 AM  Visual Inspection See comments: Yes Sensation Testing See comments: Yes Pulse Check See comments: Yes Comments R midfoot amputation. Plantar small ulcer - healing. Decreased sensation to monofilament B forefeet. Barely palpable pedal pulses R foot.     ASSESSMENT: 1. DM2, insulin-dependent, uncontrolled, with complications - CAD, s/p AMI - ICM, with CHF, s/p ICD - Persistent A-fib - CKD - PN - Transmetatarsal amputation  2. HL  PLAN:  1. Patient with longstanding uncontrolled, type 2 diabetes, on injectable antidiabetic regimen with GLP-1 receptor agonist and basal-bolus insulin.  At last visit HbA1c was slightly lower, at 6.6%.  We did not change his regimen at that time since sugars were fluctuating within the target range with only few occasional hyperglycemic exceptions especially after breakfast.  He was trying to inject his insulin 15 minutes before each meal. CGM interpretation: -At today's visit, we reviewed his CGM downloads: It appears that 84% of values are in target range (goal >70%), while 16% are higher than 180 (goal <25%), and 0% are lower than 70 (goal <4%).  The calculated average blood sugar is 147.  The projected HbA1c for the next 3 months (GMI) is 6.8%. -Reviewing the CGM trends, sugars appear to be improving, much better and with fluctuations almost entirely within the target range in the last week, but with occasional hyperglycemic spikes review of the last 2  weeks downloads (see below).  We discussed about the possibility of needing a slightly higher dose of Humalog before larger meals, but otherwise, I did not suggest a change in regimen. - I suggested to:  Patient Instructions  Please continue: - Victoza 1.8 mg before breakfast - Lantus 14 units in am - Humalog (10)-(10)-(5-9) units 15 min before the 3 meals If you take Humalog after a meal, may need to reduce the dose to 5-6 units.  Please return in 4 months.  - we checked his HbA1c: 6.9% (slightly higher) - advised to check sugars at different times of the day - 4x a day, rotating check times - advised for yearly eye exams >> he is UTD - return to clinic in 4 months  2. HL -Reviewed latest lipid panel from almost a year ago: LDL slightly above our goal of less than 55, triglycerides elevated, HDL slightly low Lab Results  Component Value Date   CHOL 136 08/09/2021   HDL 38.30 (L) 08/09/2021   LDLCALC 60 08/09/2021   LDLDIRECT 134.0 10/17/2017   TRIG 186.0 (H) 08/09/2021   CHOLHDL 4 08/09/2021  -He continues Crestor 20 mg daily without side effects -He would be due for another cholesterol level next month.  He is planning to have another appointment with Dr. Jonny Ruiz.  Carlus Pavlov, MD  PhD [facility]

## 2022-07-30 ENCOUNTER — Other Ambulatory Visit: Payer: Self-pay | Admitting: Internal Medicine

## 2022-07-30 DIAGNOSIS — E0800 Diabetes mellitus due to underlying condition with hyperosmolarity without nonketotic hyperglycemic-hyperosmolar coma (NKHHC): Secondary | ICD-10-CM

## 2022-08-01 ENCOUNTER — Other Ambulatory Visit: Payer: Self-pay | Admitting: Internal Medicine

## 2022-08-08 DIAGNOSIS — Z89439 Acquired absence of unspecified foot: Secondary | ICD-10-CM | POA: Diagnosis not present

## 2022-08-08 DIAGNOSIS — L97511 Non-pressure chronic ulcer of other part of right foot limited to breakdown of skin: Secondary | ICD-10-CM | POA: Diagnosis not present

## 2022-08-17 ENCOUNTER — Other Ambulatory Visit: Payer: Self-pay

## 2022-08-17 ENCOUNTER — Other Ambulatory Visit: Payer: Self-pay | Admitting: Internal Medicine

## 2022-08-17 DIAGNOSIS — I5022 Chronic systolic (congestive) heart failure: Secondary | ICD-10-CM | POA: Diagnosis not present

## 2022-08-19 ENCOUNTER — Ambulatory Visit: Payer: Medicare Other | Admitting: Internal Medicine

## 2022-08-22 DIAGNOSIS — L97511 Non-pressure chronic ulcer of other part of right foot limited to breakdown of skin: Secondary | ICD-10-CM | POA: Diagnosis not present

## 2022-08-23 DIAGNOSIS — L97512 Non-pressure chronic ulcer of other part of right foot with fat layer exposed: Secondary | ICD-10-CM | POA: Diagnosis not present

## 2022-08-24 ENCOUNTER — Encounter: Payer: Self-pay | Admitting: Internal Medicine

## 2022-08-24 ENCOUNTER — Ambulatory Visit (INDEPENDENT_AMBULATORY_CARE_PROVIDER_SITE_OTHER): Payer: Medicare Other | Admitting: Internal Medicine

## 2022-08-24 VITALS — BP 120/82 | HR 73 | Temp 98.4°F | Ht 70.0 in | Wt 191.0 lb

## 2022-08-24 DIAGNOSIS — Z125 Encounter for screening for malignant neoplasm of prostate: Secondary | ICD-10-CM | POA: Diagnosis not present

## 2022-08-24 DIAGNOSIS — I10 Essential (primary) hypertension: Secondary | ICD-10-CM | POA: Diagnosis not present

## 2022-08-24 DIAGNOSIS — E538 Deficiency of other specified B group vitamins: Secondary | ICD-10-CM | POA: Diagnosis not present

## 2022-08-24 DIAGNOSIS — E785 Hyperlipidemia, unspecified: Secondary | ICD-10-CM | POA: Diagnosis not present

## 2022-08-24 DIAGNOSIS — E559 Vitamin D deficiency, unspecified: Secondary | ICD-10-CM | POA: Diagnosis not present

## 2022-08-24 DIAGNOSIS — Z7984 Long term (current) use of oral hypoglycemic drugs: Secondary | ICD-10-CM | POA: Diagnosis not present

## 2022-08-24 DIAGNOSIS — Z1211 Encounter for screening for malignant neoplasm of colon: Secondary | ICD-10-CM

## 2022-08-24 DIAGNOSIS — Z0001 Encounter for general adult medical examination with abnormal findings: Secondary | ICD-10-CM

## 2022-08-24 DIAGNOSIS — E1159 Type 2 diabetes mellitus with other circulatory complications: Secondary | ICD-10-CM | POA: Diagnosis not present

## 2022-08-24 DIAGNOSIS — E1165 Type 2 diabetes mellitus with hyperglycemia: Secondary | ICD-10-CM

## 2022-08-24 DIAGNOSIS — I5022 Chronic systolic (congestive) heart failure: Secondary | ICD-10-CM

## 2022-08-24 LAB — LIPID PANEL
Cholesterol: 122 mg/dL (ref 0–200)
HDL: 35.6 mg/dL — ABNORMAL LOW (ref >39.00)
NonHDL: 86.29
Total CHOL/HDL Ratio: 3
Triglycerides: 270 mg/dL — ABNORMAL HIGH (ref 0.0–149.0)
VLDL: 54 mg/dL — ABNORMAL HIGH (ref 0.0–40.0)

## 2022-08-24 LAB — CBC WITH DIFFERENTIAL/PLATELET
Basophils Absolute: 0.1 10*3/uL (ref 0.0–0.1)
Basophils Relative: 0.8 % (ref 0.0–3.0)
Eosinophils Absolute: 0.3 10*3/uL (ref 0.0–0.7)
Eosinophils Relative: 4 % (ref 0.0–5.0)
HCT: 47.1 % (ref 39.0–52.0)
Hemoglobin: 15.2 g/dL (ref 13.0–17.0)
Lymphocytes Relative: 20.2 % (ref 12.0–46.0)
Lymphs Abs: 1.6 10*3/uL (ref 0.7–4.0)
MCHC: 32.4 g/dL (ref 30.0–36.0)
MCV: 92.2 fl (ref 78.0–100.0)
Monocytes Absolute: 1.2 10*3/uL — ABNORMAL HIGH (ref 0.1–1.0)
Monocytes Relative: 15.3 % — ABNORMAL HIGH (ref 3.0–12.0)
Neutro Abs: 4.9 10*3/uL (ref 1.4–7.7)
Neutrophils Relative %: 59.7 % (ref 43.0–77.0)
Platelets: 153 10*3/uL (ref 150.0–400.0)
RBC: 5.11 Mil/uL (ref 4.22–5.81)
RDW: 14.4 % (ref 11.5–15.5)
WBC: 8.1 10*3/uL (ref 4.0–10.5)

## 2022-08-24 LAB — URINALYSIS, ROUTINE W REFLEX MICROSCOPIC
Bilirubin Urine: NEGATIVE
Hgb urine dipstick: NEGATIVE
Ketones, ur: NEGATIVE
Leukocytes,Ua: NEGATIVE
Nitrite: NEGATIVE
RBC / HPF: NONE SEEN (ref 0–?)
Specific Gravity, Urine: 1.015 (ref 1.000–1.030)
Total Protein, Urine: NEGATIVE
Urine Glucose: >=1000 — AB
Urobilinogen, UA: 0.2 (ref 0.0–1.0)
pH: 5.5 (ref 5.0–8.0)

## 2022-08-24 LAB — BASIC METABOLIC PANEL
BUN: 22 mg/dL (ref 6–23)
CO2: 25 mEq/L (ref 19–32)
Calcium: 9.5 mg/dL (ref 8.4–10.5)
Chloride: 100 mEq/L (ref 96–112)
Creatinine, Ser: 1.32 mg/dL (ref 0.40–1.50)
GFR: 53.01 mL/min — ABNORMAL LOW (ref >60.00)
Glucose, Bld: 175 mg/dL — ABNORMAL HIGH (ref 70–99)
Potassium: 4.4 mEq/L (ref 3.5–5.1)
Sodium: 136 mEq/L (ref 135–145)

## 2022-08-24 LAB — HEPATIC FUNCTION PANEL
ALT: 13 U/L (ref 0–53)
AST: 18 U/L (ref 0–37)
Albumin: 4.1 g/dL (ref 3.5–5.2)
Alkaline Phosphatase: 76 U/L (ref 39–117)
Bilirubin, Direct: 0.1 mg/dL (ref 0.0–0.3)
Total Bilirubin: 0.6 mg/dL (ref 0.2–1.2)
Total Protein: 6.8 g/dL (ref 6.0–8.3)

## 2022-08-24 LAB — MICROALBUMIN / CREATININE URINE RATIO
Creatinine,U: 58.7 mg/dL
Microalb Creat Ratio: 1.2 mg/g (ref 0.0–30.0)
Microalb, Ur: <0.7 mg/dL (ref 0.0–1.9)

## 2022-08-24 LAB — HEMOGLOBIN A1C: Hgb A1c MFr Bld: 7.3 % — ABNORMAL HIGH (ref 4.6–6.5)

## 2022-08-24 LAB — VITAMIN B12: Vitamin B-12: 777 pg/mL (ref 211–911)

## 2022-08-24 LAB — VITAMIN D 25 HYDROXY (VIT D DEFICIENCY, FRACTURES): VITD: 27.76 ng/mL — ABNORMAL LOW (ref 30.00–100.00)

## 2022-08-24 LAB — TSH: TSH: 4.71 u[IU]/mL (ref 0.35–5.50)

## 2022-08-24 LAB — LDL CHOLESTEROL, DIRECT: Direct LDL: 63 mg/dL

## 2022-08-24 LAB — PSA: PSA: 0.49 ng/mL (ref 0.10–4.00)

## 2022-08-24 MED ORDER — LORAZEPAM 0.5 MG PO TABS: 0.5000 mg | ORAL_TABLET | Freq: Every day | ORAL | 1 refills | Status: DC

## 2022-08-24 MED ORDER — FLUOXETINE HCL 10 MG PO CAPS: 10.0000 mg | ORAL_CAPSULE | Freq: Every day | ORAL | 3 refills | Status: DC

## 2022-08-24 NOTE — Progress Notes (Signed)
Patient ID: [name], male   DOB: September 30, 1945, 77 y.o.   MRN: [id number]         Chief Complaint:: wellness exam and dm, htn, hld, chf, low vit d       HPI:  [name] is a 77 y.o. male here for wellness exam; for tdap at pharmacy, for colonoscopy, delcines covid booster, o/w up to date                        Also Pt denies chest pain, increased sob or doe, wheezing, orthopnea, PND, increased LE swelling, palpitations, dizziness or syncope.   Pt denies polydipsia, polyuria, or new focal neuro s/s.    Pt denies fever, wt loss, night sweats, loss of appetite, or other constitutional symptoms none   Wt Readings from Last 3 Encounters:  08/24/22 191 lb (86.6 kg)  07/21/22 192 lb 9.6 oz (87.4 kg)  04/01/22 191 lb (86.6 kg)   BP Readings from Last 3 Encounters:  08/24/22 120/82  07/21/22 122/68  04/01/22 110/66   Immunization History  Administered Date(s) Administered   Influenza Whole 12/02/2008, 03/02/2012   Influenza, High Dose Seasonal PF 12/26/2014, 12/05/2016, 11/30/2017   Influenza,inj,Quad PF,6+ Mos 12/18/2013, 12/02/2019   Influenza,inj,quad, With Preservative 11/29/2018   Influenza-Unspecified 11/28/2012, 11/30/2017   Moderna Sars-Covid-2 Vaccination 04/29/2019, 05/27/2019   PFIZER Comirnaty(Gray Top)Covid-19 Tri-Sucrose Vaccine 12/27/2021   Pneumococcal Conjugate-13 02/04/2014   Pneumococcal Polysaccharide-23 08/09/2021   Zoster Recombinant(Shingrix) 03/03/2017, 07/31/2017   Health Maintenance Due  Topic Date Due   DTaP/Tdap/Td (1 - Tdap) Never done   Colonoscopy  11/22/2010   COVID-19 Vaccine (4 - 2023-24 season) 02/21/2022      Past Medical History:  Diagnosis Date   CALLUS, RIGHT FOOT 12/08/2009   Cardiac arrest (HCC)    Chronic systolic heart failure (HCC) 11/03/2009   DIABETES MELLITUS, TYPE II 12/05/2006   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/2009   HYPERLIPIDEMIA 11/25/2006   HYPOGONADISM, MALE 12/05/2006   Implantable cardiac defibrillator MDT 09/22/2008    Change out feb 2011, 2015   Ischemic cardiomyopathy 09/22/2008   DES CX and RCA  70% residual LAD  Done in [place]; EF 30%;; myoviewq 09-25-2006 no ischemia   Kidney stones    Long term (current) use of anticoagulants 04/25/2010   Myocardial infarction [facility]) 2004/09/24   "I died and they brought me back"   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   NEPHROLITHIASIS, HX OF 02/25/2008   Persistent atrial fibrillation (HCC) 11/25/2006   Inapp shock Sep 25, 2010 AF VR >220 recurrent   Past Surgical History:  Procedure Laterality Date   CARDIAC CATHETERIZATION     feburary 09/24/12   CARDIAC DEFIBRILLATOR PLACEMENT     Guidant Vitality [name]   CARDIOVERSION N/A 04/25/2012   Procedure: CARDIOVERSION;  Surgeon: Cassell Clement, MD;  Location: [facility] ENDOSCOPY;  Service: Cardiovascular;  Laterality: N/A;   CARDIOVERSION N/A 05/21/2012   Procedure: CARDIOVERSION;  Surgeon: Duke Salvia, MD;  Location: [facility] ENDOSCOPY;  Service: Cardiovascular;  Laterality: N/A;   CATARACT EXTRACTION     EYE SURGERY     cataracts   IMPLANTABLE CARDIOVERTER DEFIBRILLATOR (ICD) GENERATOR CHANGE N/A 02/05/2014   Procedure: ICD GENERATOR CHANGE;  Surgeon: Duke Salvia, MD;  Location: [facility] CATH LAB;  Service: Cardiovascular;  Laterality: N/A;   PTCA     TONSILLECTOMY     TONSILLECTOMY     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   Procedure: TRANSMETATARSAL AMPUTATION RIGHT;  Surgeon: Chestine Spore,  Canary Brim, MD;  Location[facility] OR;  Service: Vascular;  Laterality: Right;    reports that he has never smoked. He has never used smokeless tobacco. He reports current alcohol use. He reports that he does not use drugs. family history includes Breast cancer in his mother; Diabetes in his maternal grandmother; Hyperlipidemia in his maternal grandmother. Allergies  Allergen Reactions   Salmon [Fish Allergy] Other (See Comments)    Only canned/ not fresh (probably preservatives).    Sulfa Antibiotics     Not sure has allergyu to this Only canned/  not fresh (probably preservatives).    Metformin And Related Other (See Comments)    diarrhea   Current Outpatient Medications on File Prior to Visit  Medication Sig Dispense Refill   aspirin EC 81 MG tablet Take 81 mg by mouth daily. Swallow whole.     Continuous Blood Gluc Sensor (FREESTYLE LIBRE 2 SENSOR) MISC Apply new sensore every 14 days 6 each 3   Continuous Glucose Receiver (FREESTYLE LIBRE 2 READER) DEVI Use as instructure to check blood sugar 1 each 0   dofetilide (TIKOSYN) 500 MCG capsule TAKE 1 CAPSULE BY MOUTH 2 TIMES DAILY (Patient taking differently: Take 500 mcg by mouth 2 (two) times daily.) 60 capsule 0   empagliflozin (JARDIANCE) 25 MG TABS tablet Take 25 mg by mouth daily.     eplerenone (INSPRA) 25 MG tablet Take 25 mg by mouth daily.     fluticasone (FLONASE) 50 MCG/ACT nasal spray USE 2 SPRAYS IN EACH NOSTRIL EVERY DAY (Patient taking differently: Place 2 sprays into both nostrils daily.) 16 g 5   Glucose Blood (FREESTYLE PRECISION NEO TEST VI) by In Vitro route.     glucose blood (ONE TOUCH ULTRA TEST) test strip 1 each by Other route 2 (two) times daily. 200 each 2   insulin glargine (LANTUS SOLOSTAR) 100 UNIT/ML Solostar Pen Inject 14 Units into the skin daily. 15 mL 11   insulin lispro (HUMALOG KWIKPEN) 100 UNIT/ML KwikPen inject 11 UNITS BEFORE breakfast AND LUNCH AND 10 UNITS BEFORE dinner. 45 mL 0   Lancets MISC Use as directed up to 4 times per day 100 each 11   liraglutide (VICTOZA) 18 MG/3ML SOPN Inject 1.8 mg into the skin every evening.     loratadine (CLARITIN) 10 MG tablet Take 10 mg by mouth daily.     LORazepam (ATIVAN) 1 MG tablet TAKE 1/2 TABLET BY MOUTH EVERY DAY AS NEEDED FOR ANXIETY (Patient taking differently: Take 0.5 mg by mouth 2 (two) times daily as needed for anxiety or sleep.) 45 tablet 1   Magnesium Oxide (MAG-OX 400 PO) Take 400 mg by mouth 2 (two) times daily.     metoprolol (TOPROL-XL) 200 MG 24 hr tablet Take 200 mg by mouth daily.      Multiple Vitamins-Minerals (MULTIVITAMIN PO) Take 1 tablet by mouth daily.     NITROSTAT 0.4 MG SL tablet Place 1 tablet (0.4 mg total) under the tongue every 5 (five) minutes as needed for chest pain. 25 tablet 5   rosuvastatin (CRESTOR) 20 MG tablet TAKE 1 TABLET BY MOUTH EVERY DAY (Patient taking differently: Take 20 mg by mouth every evening.) 90 tablet 0   sacubitril-valsartan (ENTRESTO) 24-26 MG Take 1 tablet by mouth 2 (two) times daily.     torsemide (DEMADEX) 10 MG tablet Take 10 mg by mouth once.     ULTICARE MICRO PEN NEEDLES 32G X 4 MM MISC USE DAILY AS DIRECTED 100 each 3  XARELTO 20 MG TABS tablet TAKE 1 TABLET BY MOUTH EVERY DAY (Patient taking differently: Take 20 mg by mouth daily with supper.) 30 tablet 5   zolpidem (AMBIEN) 5 MG tablet TAKE 1 OR 2 TABLETS BY MOUTH NIGHTLY AT BEDTIME AS NEEDED (Patient taking differently: Take 10 mg by mouth at bedtime as needed for sleep.) 30 tablet 0   No current facility-administered medications on file prior to visit.        ROS:  All others reviewed and negative.  Objective        PE:  BP 120/82 (BP Location: Right Arm, Patient Position: Sitting, Cuff Size: Normal)   Pulse 73   Temp 98.4 F (36.9 C) (Oral)   Ht 5\' 10"  (1.778 m)   Wt 191 lb (86.6 kg)   SpO2 98%   BMI 27.41 kg/m                 Constitutional: Pt appears in NAD               HENT: Head: NCAT.                Right Ear: External ear normal.                 Left Ear: External ear normal.                Eyes: . Pupils are equal, round, and reactive to light. Conjunctivae and EOM are normal               Nose: without d/c or deformity               Neck: Neck supple. Gross normal ROM               Cardiovascular: Normal rate and regular rhythm.                 Pulmonary/Chest: Effort normal and breath sounds without rales or wheezing.                Abd:  Soft, NT, ND, + BS, no organomegaly               Neurological: Pt is alert. At baseline orientation, motor  grossly intact               Skin: Skin is warm. No rashes, no other new lesions, LE edema - none               Psychiatric: Pt behavior is normal without agitation   Micro: none  Cardiac tracings I have personally interpreted today:  none  Pertinent Radiological findings (summarize): none   Lab Results  Component Value Date   WBC 8.1 08/24/2022   HGB 15.2 08/24/2022   HCT 47.1 08/24/2022   PLT 153.0 08/24/2022   GLUCOSE 175 (H) 08/24/2022   CHOL 122 08/24/2022   TRIG 270.0 (H) 08/24/2022   HDL 35.60 (L) 08/24/2022   LDLDIRECT 63.0 08/24/2022   LDLCALC 60 08/09/2021   ALT 13 08/24/2022   AST 18 08/24/2022   NA 136 08/24/2022   K 4.4 08/24/2022   CL 100 08/24/2022   CREATININE 1.32 08/24/2022   BUN 22 08/24/2022   CO2 25 08/24/2022   TSH 4.71 08/24/2022   PSA 0.49 08/24/2022   INR 1.6 (H) 02/11/2020   HGBA1C 7.3 (H) 08/24/2022   MICROALBUR <0.7 08/24/2022   Assessment/Plan:  [name] is a 77 y.o. White or Caucasian [1]  male with  has a past medical history of CALLUS, RIGHT FOOT (12/08/2009), Cardiac arrest [facility]), Chronic systolic heart failure (HCC) (03/05/1094), DIABETES MELLITUS, TYPE II (12/05/2006), DIABETIC PERIPHERAL NEUROPATHY (12/08/2009), HYPERLIPIDEMIA (11/25/2006), HYPOGONADISM, MALE (12/05/2006), Implantable cardiac defibrillator MDT (09/22/2008), Ischemic cardiomyopathy (09/22/2008), Kidney stones, Long term (current) use of anticoagulants (04/25/2010), Myocardial infarction (HCC) (2006), Neoplasm of uncertain behavior of skin (06/05/2007), NEPHROLITHIASIS, HX OF (02/25/2008), and Persistent atrial fibrillation (HCC) (11/25/2006).  SYSTOLIC HEART FAILURE, CHRONIC Overall stable volum, for contd torsemide 10 mg every day, entresto  Encounter for well adult exam with abnormal findings Age and sex appropriate education and counseling updated with regular exercise and diet Referrals for preventative services - for colonoscopy Immunizations addressed - for tdap at  pharmacy, declines colonoscopy Smoking counseling  - none needed Evidence for depression or other mood disorder - none significant Most recent labs reviewed. I have personally reviewed and have noted: 1) the patient's medical and social history 2) The patient's current medications and supplements 3) The patient's height, weight, and BMI have been recorded in the chart   Poorly controlled type 2 diabetes mellitus with circulatory disorder (HCC) Lab Results  Component Value Date   HGBA1C 7.3 (H) 08/24/2022   Uncontrolled, mild, pt to continue current medical treatment jardiance 25 every day, lantus 14 u d, humalog 11 units ac, victoza 1.8 mg every day as declines change for now   Hyperlipidemia Lab Results  Component Value Date   LDLCALC 60 08/09/2021   Stable, pt to continue current statin crestor 20 qd   HTN (hypertension) BP Readings from Last 3 Encounters:  08/24/22 120/82  07/21/22 122/68  04/01/22 110/66   Stable, pt to continue medical treatment toprol xl 200 qd   Vitamin D deficiency Last vitamin D Lab Results  Component Value Date   VD25OH 27.76 (L) 08/24/2022   Low to start oral replacement  Followup: Return in about 6 months (around 02/23/2023).  Oliver Barre, MD 08/27/2022 6:43 PM [facility] Medicine

## 2022-08-24 NOTE — Patient Instructions (Addendum)
Please have your Tdap tetanus shots done at your local pharmacy.  Please continue all other medications as before, and refills have been done if requested.  Please have the pharmacy call with any other refills you may need.  Please continue your efforts at being more active, low cholesterol diet, and weight control.  You are otherwise up to date with prevention measures today.  Please keep your appointments with your specialists as you may have planned  You will be contacted regarding the referral for: colonoscopy  Please go to the LAB at the blood drawing area for the tests to be done  You will be contacted by phone if any changes need to be made immediately.  Otherwise, you will receive a letter about your results with an explanation, but please check with MyChart first.  Please remember to sign up for MyChart if you have not done so, as this will be important to you in the future with finding out test results, communicating by private email, and scheduling acute appointments online when needed.  Please make an Appointment to return in 6 months, or sooner if needed

## 2022-08-24 NOTE — Progress Notes (Signed)
The test results show that your current treatment is OK, as the tests are stable, except the Vitamin D level is low.  Please take OTC Vitamin D3 at 2000 units per day, indefinitely,.  Please continue the same plan.  There is no other need for change of treatment or further evaluation based on these results, at this time.  thanks

## 2022-08-27 ENCOUNTER — Encounter: Payer: Self-pay | Admitting: Internal Medicine

## 2022-08-27 DIAGNOSIS — E559 Vitamin D deficiency, unspecified: Secondary | ICD-10-CM | POA: Insufficient documentation

## 2022-08-27 NOTE — Assessment & Plan Note (Addendum)
Overall stable volum, for contd torsemide 10 mg every day, entresto

## 2022-08-27 NOTE — Assessment & Plan Note (Signed)
Last vitamin D Lab Results  Component Value Date   VD25OH 27.76 (L) 08/24/2022   Low to start oral replacement

## 2022-08-27 NOTE — Assessment & Plan Note (Signed)
BP Readings from Last 3 Encounters:  08/24/22 120/82  07/21/22 122/68  04/01/22 110/66   Stable, pt to continue medical treatment toprol xl 200 qd

## 2022-08-27 NOTE — Assessment & Plan Note (Addendum)
Lab Results  Component Value Date   HGBA1C 7.3 (H) 08/24/2022   Uncontrolled, mild, pt to continue current medical treatment jardiance 25 every day, lantus 14 u d, humalog 11 units ac, victoza 1.8 mg every day as declines change for now

## 2022-08-27 NOTE — Assessment & Plan Note (Addendum)
Age and sex appropriate education and counseling updated with regular exercise and diet Referrals for preventative services - for colonoscopy Immunizations addressed - for tdap at pharmacy, declines colonoscopy Smoking counseling  - none needed Evidence for depression or other mood disorder - none significant Most recent labs reviewed. I have personally reviewed and have noted: 1) the patient's medical and social history 2) The patient's current medications and supplements 3) The patient's height, weight, and BMI have been recorded in the chart

## 2022-08-27 NOTE — Assessment & Plan Note (Signed)
Lab Results  Component Value Date   LDLCALC 60 08/09/2021   Stable, pt to continue current statin crestor 20 qd

## 2022-09-01 DIAGNOSIS — I5042 Chronic combined systolic (congestive) and diastolic (congestive) heart failure: Secondary | ICD-10-CM | POA: Diagnosis not present

## 2022-09-01 DIAGNOSIS — Z4509 Encounter for adjustment and management of other cardiac device: Secondary | ICD-10-CM | POA: Diagnosis not present

## 2022-09-05 ENCOUNTER — Other Ambulatory Visit: Payer: Self-pay | Admitting: Internal Medicine

## 2022-09-05 DIAGNOSIS — E0842 Diabetes mellitus due to underlying condition with diabetic polyneuropathy: Secondary | ICD-10-CM

## 2022-09-08 DIAGNOSIS — Z95810 Presence of automatic (implantable) cardiac defibrillator: Secondary | ICD-10-CM | POA: Diagnosis not present

## 2022-09-08 DIAGNOSIS — I10 Essential (primary) hypertension: Secondary | ICD-10-CM | POA: Diagnosis not present

## 2022-09-08 DIAGNOSIS — F419 Anxiety disorder, unspecified: Secondary | ICD-10-CM | POA: Diagnosis not present

## 2022-09-08 DIAGNOSIS — Z45018 Encounter for adjustment and management of other part of cardiac pacemaker: Secondary | ICD-10-CM | POA: Diagnosis not present

## 2022-09-08 DIAGNOSIS — I5020 Unspecified systolic (congestive) heart failure: Secondary | ICD-10-CM | POA: Diagnosis not present

## 2022-09-12 DIAGNOSIS — L97511 Non-pressure chronic ulcer of other part of right foot limited to breakdown of skin: Secondary | ICD-10-CM | POA: Diagnosis not present

## 2022-09-14 DIAGNOSIS — I5020 Unspecified systolic (congestive) heart failure: Secondary | ICD-10-CM | POA: Diagnosis not present

## 2022-09-14 DIAGNOSIS — I5022 Chronic systolic (congestive) heart failure: Secondary | ICD-10-CM | POA: Diagnosis not present

## 2022-09-26 ENCOUNTER — Other Ambulatory Visit: Payer: Self-pay | Admitting: Internal Medicine

## 2022-09-26 DIAGNOSIS — E0800 Diabetes mellitus due to underlying condition with hyperosmolarity without nonketotic hyperglycemic-hyperosmolar coma (NKHHC): Secondary | ICD-10-CM

## 2022-09-26 DIAGNOSIS — L97511 Non-pressure chronic ulcer of other part of right foot limited to breakdown of skin: Secondary | ICD-10-CM | POA: Diagnosis not present

## 2022-10-03 DIAGNOSIS — I5042 Chronic combined systolic (congestive) and diastolic (congestive) heart failure: Secondary | ICD-10-CM | POA: Diagnosis not present

## 2022-10-03 DIAGNOSIS — Z95811 Presence of heart assist device: Secondary | ICD-10-CM | POA: Diagnosis not present

## 2022-10-10 DIAGNOSIS — L97411 Non-pressure chronic ulcer of right heel and midfoot limited to breakdown of skin: Secondary | ICD-10-CM | POA: Diagnosis not present

## 2022-10-11 DIAGNOSIS — I5020 Unspecified systolic (congestive) heart failure: Secondary | ICD-10-CM | POA: Diagnosis not present

## 2022-10-24 DIAGNOSIS — Z89439 Acquired absence of unspecified foot: Secondary | ICD-10-CM | POA: Diagnosis not present

## 2022-10-24 DIAGNOSIS — L97511 Non-pressure chronic ulcer of other part of right foot limited to breakdown of skin: Secondary | ICD-10-CM | POA: Diagnosis not present

## 2022-11-03 DIAGNOSIS — I5042 Chronic combined systolic (congestive) and diastolic (congestive) heart failure: Secondary | ICD-10-CM | POA: Diagnosis not present

## 2022-11-09 DIAGNOSIS — I5020 Unspecified systolic (congestive) heart failure: Secondary | ICD-10-CM | POA: Diagnosis not present

## 2022-11-14 DIAGNOSIS — L97512 Non-pressure chronic ulcer of other part of right foot with fat layer exposed: Secondary | ICD-10-CM | POA: Diagnosis not present

## 2022-11-14 DIAGNOSIS — L97411 Non-pressure chronic ulcer of right heel and midfoot limited to breakdown of skin: Secondary | ICD-10-CM | POA: Diagnosis not present

## 2022-11-14 DIAGNOSIS — L97522 Non-pressure chronic ulcer of other part of left foot with fat layer exposed: Secondary | ICD-10-CM | POA: Diagnosis not present

## 2022-11-21 ENCOUNTER — Encounter: Payer: Self-pay | Admitting: Internal Medicine

## 2022-11-21 ENCOUNTER — Ambulatory Visit: Payer: Medicare Other | Admitting: Internal Medicine

## 2022-11-21 VITALS — BP 110/80 | HR 66 | Resp 16 | Ht 70.0 in | Wt 192.2 lb

## 2022-11-21 DIAGNOSIS — E785 Hyperlipidemia, unspecified: Secondary | ICD-10-CM

## 2022-11-21 DIAGNOSIS — E1159 Type 2 diabetes mellitus with other circulatory complications: Secondary | ICD-10-CM | POA: Diagnosis not present

## 2022-11-21 DIAGNOSIS — E1165 Type 2 diabetes mellitus with hyperglycemia: Secondary | ICD-10-CM | POA: Diagnosis not present

## 2022-11-21 DIAGNOSIS — Z794 Long term (current) use of insulin: Secondary | ICD-10-CM | POA: Diagnosis not present

## 2022-11-21 LAB — POCT GLYCOSYLATED HEMOGLOBIN (HGB A1C): Hemoglobin A1C: 7.2 % — AB (ref 4.0–5.6)

## 2022-11-21 NOTE — Addendum Note (Signed)
Addended by: Pollie Meyer on: 11/21/2022 01:17 PM   Modules accepted: Orders

## 2022-11-21 NOTE — Patient Instructions (Addendum)
Please continue: - Victoza 1.8 mg before breakfast - Lantus 14 units in am  Change: - Humalog: 10-12 units before meals  If you take Humalog after a meal, may need to reduce the dose to 5-6 units.  Please return in 4 months.

## 2022-11-21 NOTE — Progress Notes (Signed)
Patient ID: [name], male   DOB: Oct 01, 1945, 77 y.o.   MRN: [id number]  HPI: [name] is a 77 y.o.-year-old male, returning for follow-up for DM2, dx in 02/10/07, insulin-dependent since 02-09-17, uncontrolled, with complications (CAD, CHF, CKD, PN, s/p trans metatarsal amputation). Pt. previously saw Dr. Everardo All, but last visit with me visit 4 months ago.  Interim history: No increased urination, nausea, chest pain. He has some decreased vision, stable.  Reviewed HbA1c: Lab Results  Component Value Date   HGBA1C 7.3 (H) 08/24/2022   HGBA1C 6.9 (A) 07/21/2022   HGBA1C 6.6 (A) 04/01/2022   HGBA1C 6.7 (A) 12/17/2021   HGBA1C 7.6 (H) 08/09/2021   HGBA1C 7.1 (A) 06/15/2021   HGBA1C 6.9 (A) 03/09/2021   HGBA1C 6.3 (A) 04/01/2020   HGBA1C 6.9 (H) 02/11/2020   HGBA1C 7.0 (A) 12/10/2019   Pt is on a regimen of: - Lantus 14 units in am - Humalog 01-08-09 units before or after meals >> (10)-(10)-(5-9 >> 10) units 15 min before the 3 meals - Victoza 1.8 mg at night  >> in am Metformin caused nausea. He declines a V-Go device due to cost.  Pt checks his sugars >4x a day - Libre CGM (receiver):  Previously:  Previously:   Lowest sugar was 40s >> .Marland Kitchen.60s >> 50s; he has hypoglycemia awareness at 70.  Highest sugar was upper 200s >> 200 >> 200 x2 >> upper 200s  Glucometer:?  - + CKD, last BUN/creatinine:   Lab Results  Component Value Date   BUN 22 08/24/2022   BUN 21 08/09/2021   CREATININE 1.32 08/24/2022   CREATININE 1.12 08/09/2021  07/20/2022: 14/0.94, GFR 85 Lab Results  Component Value Date   MICRALBCREAT 1.2 08/24/2022   MICRALBCREAT 3.9 08/09/2021   MICRALBCREAT 0.7 10/17/2017   MICRALBCREAT 2.5 10/12/2016   MICRALBCREAT 3.2 02/04/2014  He is not on ACE inhibitor or ARB.  -+ HL; last set of lipids: Lab Results  Component Value Date   CHOL 122 08/24/2022   HDL 35.60 (L) 08/24/2022   LDLCALC 60 08/09/2021   LDLDIRECT 63.0 08/24/2022   TRIG 270.0 (H) 08/24/2022    CHOLHDL 3 08/24/2022  On Crestor 20 mg daily.  - last eye exam was 04/24/2022. No DR reportedly.   - no numbness and tingling in his feet.  Last foot exam 08/24/2022.  He has a history of nephrolithiasis, hypogonadism. He is in a clinical trial  - a supplement that helps with heart condition >> improves stamina and SOB.  ROS: + see HPI  Past Medical History:  Diagnosis Date   CALLUS, RIGHT FOOT 12/08/2009   Cardiac arrest (HCC)    Chronic systolic heart failure (HCC) 11/03/2009   DIABETES MELLITUS, TYPE II 12/05/2006   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/2009   HYPERLIPIDEMIA 11/25/2006   HYPOGONADISM, MALE 12/05/2006   Implantable cardiac defibrillator MDT 09/22/2008   Change out feb 2011, 2015   Ischemic cardiomyopathy 09/22/2008   DES CX and RCA  70% residual LAD  Done in [place]; EF 30%;; myoviewq 02-10-2007 no ischemia   Kidney stones    Long term (current) use of anticoagulants 04/25/2010   Myocardial infarction [facility]) 02/09/2005   "I died and they brought me back"   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   NEPHROLITHIASIS, HX OF 02/25/2008   Persistent atrial fibrillation (HCC) 11/25/2006   Inapp shock 02/10/11 AF VR >220 recurrent   Past Surgical History:  Procedure Laterality Date   CARDIAC CATHETERIZATION  feburary 2014   CARDIAC DEFIBRILLATOR PLACEMENT     Guidant Vitality [name]   CARDIOVERSION N/A 04/25/2012   Procedure: CARDIOVERSION;  Surgeon: Cassell Clement, MD;  Location: [facility] ENDOSCOPY;  Service: Cardiovascular;  Laterality: N/A;   CARDIOVERSION N/A 05/21/2012   Procedure: CARDIOVERSION;  Surgeon: Duke Salvia, MD;  Location: [facility] ENDOSCOPY;  Service: Cardiovascular;  Laterality: N/A;   CATARACT EXTRACTION     EYE SURGERY     cataracts   IMPLANTABLE CARDIOVERTER DEFIBRILLATOR (ICD) GENERATOR CHANGE N/A 02/05/2014   Procedure: ICD GENERATOR CHANGE;  Surgeon: Duke Salvia, MD;  Location[facility] CATH LAB;  Service: Cardiovascular;  Laterality: N/A;   PTCA     TONSILLECTOMY      TONSILLECTOMY     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   Procedure: TRANSMETATARSAL AMPUTATION RIGHT;  Surgeon: Cephus Shelling, MD;  Location: [facility] OR;  Service: Vascular;  Laterality: Right;   Social History   Socioeconomic History   Marital status: Divorced    Spouse name: Not on file   Number of children: 1   Years of education: 16   Highest education level: Not on file  Occupational History   Occupation: Musician business and show production    Employer: [employer]  Tobacco Use   Smoking status: Never   Smokeless tobacco: Never  Vaping Use   Vaping status: Never Used  Substance and Sexual Activity   Alcohol use: Yes    Alcohol/week: 0.0 standard drinks of alcohol    Comment: summer    Drug use: No   Sexual activity: Yes    Partners: Female  Other Topics Concern   Not on file  Social History Narrative   HSG, College grad. Married - divorced. 1 daughter. owner/operator Optician, dispensing business and show production   Social Determinants of Health   Financial Resource Strain: Low Risk  (03/18/2022)   Overall Financial Resource Strain (CARDIA)    Difficulty of Paying Living Expenses: Not hard at all  Food Insecurity: No Food Insecurity (03/18/2022)   Hunger Vital Sign    Worried About Running Out of Food in the Last Year: Never true    Ran Out of Food in the Last Year: Never true  Transportation Needs: No Transportation Needs (03/18/2022)   PRAPARE - Administrator, Civil Service (Medical): No    Lack of Transportation (Non-Medical): No  Physical Activity: Inactive (03/18/2022)   Exercise Vital Sign    Days of Exercise per Week: 0 days    Minutes of Exercise per Session: 0 min  Stress: No Stress Concern Present (03/18/2022)   [employer] of Occupational Health - Occupational Stress Questionnaire    Feeling of Stress : Not at all  Social Connections: Unknown (03/18/2022)   Social Connection and Isolation Panel [NHANES]     Frequency of Communication with Friends and Family: More than three times a week    Frequency of Social Gatherings with Friends and Family: More than three times a week    Attends Religious Services: Patient declined    Database administrator or Organizations: No    Attends Banker Meetings: Patient declined    Marital Status: Divorced  Catering manager Violence: Not At Risk (03/18/2022)   Humiliation, Afraid, Rape, and Kick questionnaire    Fear of Current or Ex-Partner: No    Emotionally Abused: No    Physically Abused: No    Sexually Abused: No   Current Outpatient Medications on File  Prior to Visit  Medication Sig Dispense Refill   aspirin EC 81 MG tablet Take 81 mg by mouth daily. Swallow whole.     Continuous Glucose Receiver (FREESTYLE LIBRE 2 READER) DEVI Use as instructure to check blood sugar 1 each 0   Continuous Glucose Sensor (FREESTYLE LIBRE 2 SENSOR) MISC Apply new sensor every 14 days 6 each 3   dofetilide (TIKOSYN) 500 MCG capsule TAKE 1 CAPSULE BY MOUTH 2 TIMES DAILY (Patient taking differently: Take 500 mcg by mouth 2 (two) times daily.) 60 capsule 0   empagliflozin (JARDIANCE) 25 MG TABS tablet Take 25 mg by mouth daily.     eplerenone (INSPRA) 25 MG tablet Take 25 mg by mouth daily.     FLUoxetine (PROZAC) 10 MG capsule Take 1 capsule (10 mg total) by mouth daily. 90 capsule 3   fluticasone (FLONASE) 50 MCG/ACT nasal spray USE 2 SPRAYS IN EACH NOSTRIL EVERY DAY (Patient taking differently: Place 2 sprays into both nostrils daily.) 16 g 5   Glucose Blood (FREESTYLE PRECISION NEO TEST VI) by In Vitro route.     glucose blood (ONE TOUCH ULTRA TEST) test strip 1 each by Other route 2 (two) times daily. 200 each 2   insulin glargine (LANTUS SOLOSTAR) 100 UNIT/ML Solostar Pen Inject 14 Units into the skin daily. 15 mL 11   insulin lispro (HUMALOG KWIKPEN) 100 UNIT/ML KwikPen inject 11 UNITS BEFORE breakfast AND LUNCH AND 10 UNITS BEFORE dinner. 45 mL 0    Lancets MISC Use as directed up to 4 times per day 100 each 11   liraglutide (VICTOZA) 18 MG/3ML SOPN Inject 1.8 mg into the skin every evening.     loratadine (CLARITIN) 10 MG tablet Take 10 mg by mouth daily.     LORazepam (ATIVAN) 0.5 MG tablet Take 1 tablet (0.5 mg total) by mouth at bedtime. 90 tablet 1   LORazepam (ATIVAN) 1 MG tablet TAKE 1/2 TABLET BY MOUTH EVERY DAY AS NEEDED FOR ANXIETY (Patient taking differently: Take 0.5 mg by mouth 2 (two) times daily as needed for anxiety or sleep.) 45 tablet 1   Magnesium Oxide (MAG-OX 400 PO) Take 400 mg by mouth 2 (two) times daily.     metoprolol (TOPROL-XL) 200 MG 24 hr tablet Take 200 mg by mouth daily.     Multiple Vitamins-Minerals (MULTIVITAMIN PO) Take 1 tablet by mouth daily.     NITROSTAT 0.4 MG SL tablet Place 1 tablet (0.4 mg total) under the tongue every 5 (five) minutes as needed for chest pain. 25 tablet 5   rosuvastatin (CRESTOR) 20 MG tablet TAKE 1 TABLET BY MOUTH EVERY DAY (Patient taking differently: Take 20 mg by mouth every evening.) 90 tablet 0   sacubitril-valsartan (ENTRESTO) 24-26 MG Take 1 tablet by mouth 2 (two) times daily.     torsemide (DEMADEX) 10 MG tablet Take 10 mg by mouth once.     ULTICARE MICRO PEN NEEDLES 32G X 4 MM MISC USE DAILY AS DIRECTED 100 each 3   XARELTO 20 MG TABS tablet TAKE 1 TABLET BY MOUTH EVERY DAY (Patient taking differently: Take 20 mg by mouth daily with supper.) 30 tablet 5   zolpidem (AMBIEN) 5 MG tablet TAKE 1 OR 2 TABLETS BY MOUTH NIGHTLY AT BEDTIME AS NEEDED (Patient taking differently: Take 10 mg by mouth at bedtime as needed for sleep.) 30 tablet 0   No current facility-administered medications on file prior to visit.   Allergies  Allergen Reactions   Salmon [  Fish Allergy] Other (See Comments)    Only canned/ not fresh (probably preservatives).    Sulfa Antibiotics     Not sure has allergyu to this Only canned/ not fresh (probably preservatives).    Metformin And Related Other  (See Comments)    diarrhea   Family History  Problem Relation Age of Onset   Diabetes Maternal Grandmother    Hyperlipidemia Maternal Grandmother    Breast cancer Mother    Cancer Neg Hx    COPD Neg Hx    Heart disease Neg Hx    PE: BP 110/80 (BP Location: Right Arm, Patient Position: Sitting, Cuff Size: Normal)   Pulse 66   Resp 16   Ht 5\' 10"  (1.778 m)   Wt 192 lb 3.2 oz (87.2 kg)   SpO2 97%   BMI 27.58 kg/m  Wt Readings from Last 3 Encounters:  11/21/22 192 lb 3.2 oz (87.2 kg)  08/24/22 191 lb (86.6 kg)  07/21/22 192 lb 9.6 oz (87.4 kg)   Constitutional: overweight, in NAD Eyes: no exophthalmos ENT: no thyromegaly, no cervical lymphadenopathy Cardiovascular: RRR, No MRG Respiratory: CTA B Musculoskeletal: + deformities: Transmetatarsal amputation Skin: no rashes Neurological: no tremor with outstretched hands  ASSESSMENT: 1. DM2, insulin-dependent, uncontrolled, with complications - CAD, s/p AMI - ICM, with CHF, s/p ICD - Persistent A-fib - CKD - PN - Transmetatarsal amputation  2. HL  PLAN:  1. Patient with longstanding, uncontrolled, type 2 diabetes, on injectable antidiabetic regimen with GLP-1 receptor agonist and basal/bolus insulin.  At last visit, sugars appears to be improving, mostly fluctuating within the target range in the week prior to the appointment but with occasional hyperglycemic spikes in the previous 2 weeks.  HbA1c was higher, at 6.9%, but still at goal.  We discussed about taking a slightly higher dose of Humalog before larger meals but otherwise we did not change his regimen.  Since then, he had another HbA1c obtained 3 months ago and this was higher, at 7.3%. CGM interpretation: -At today's visit, we reviewed his CGM downloads: It appears that 67% of values are in target range (goal >70%), while 33% are higher than 180 (goal <25%), and 0% are lower than 70 (goal <4%).  The calculated average blood sugar is 169.  The projected HbA1c for the  next 3 months (GMI) is 7.4%. -Reviewing the CGM trends, sugars are mostly fluctuating within the target range but they increase after the first meal of the day and remain slightly higher until after dinner, when they drop to the normal range.  He mentions that he sometimes forgets to take the insulin before the meals and may take it afterwards.  He is trying to remember to cut the dose in half if he takes the insulin after the meal. -He is taking fixed doses of Humalog and we discussed that he would ideally vary the doses based on the size and consistency of his meals.  I did advise him that for larger meals, he may need to increase the dose slightly.  Otherwise, we can continue the same regimen. - I suggested to:  Patient Instructions  Please continue: - Victoza 1.8 mg before breakfast - Lantus 14 units in am  Change: - Humalog: 10-12 units before meals  If you take Humalog after a meal, may need to reduce the dose to 5-6 units.  Please return in 4 months.  - we checked his HbA1c: 7.2% (slightly lower) - advised to check sugars at different times of the  day - 4x a day, rotating check times - advised for yearly eye exams >> he is UTD - return to clinic in 4 months  2. HL -Reviewed latest lipid panel from 07/2022: LDL only slightly above goal, triglycerides high, HDL low: Lab Results  Component Value Date   CHOL 122 08/24/2022   HDL 35.60 (L) 08/24/2022   LDLCALC 60 08/09/2021   LDLDIRECT 63.0 08/24/2022   TRIG 270.0 (H) 08/24/2022   CHOLHDL 3 08/24/2022  -He continues on Crestor 20 mg daily without side effects  Carlus Pavlov, MD PhD [facility] Endocrinology

## 2022-11-23 DIAGNOSIS — I5020 Unspecified systolic (congestive) heart failure: Secondary | ICD-10-CM | POA: Diagnosis not present

## 2022-11-28 DIAGNOSIS — L97511 Non-pressure chronic ulcer of other part of right foot limited to breakdown of skin: Secondary | ICD-10-CM | POA: Diagnosis not present

## 2022-12-06 DIAGNOSIS — K080 Exfoliation of teeth due to systemic causes: Secondary | ICD-10-CM | POA: Diagnosis not present

## 2022-12-07 DIAGNOSIS — I5020 Unspecified systolic (congestive) heart failure: Secondary | ICD-10-CM | POA: Diagnosis not present

## 2022-12-08 DIAGNOSIS — Z95810 Presence of automatic (implantable) cardiac defibrillator: Secondary | ICD-10-CM | POA: Diagnosis not present

## 2022-12-08 DIAGNOSIS — Z4502 Encounter for adjustment and management of automatic implantable cardiac defibrillator: Secondary | ICD-10-CM | POA: Diagnosis not present

## 2022-12-13 DIAGNOSIS — K080 Exfoliation of teeth due to systemic causes: Secondary | ICD-10-CM | POA: Diagnosis not present

## 2022-12-14 DIAGNOSIS — Z961 Presence of intraocular lens: Secondary | ICD-10-CM | POA: Diagnosis not present

## 2022-12-14 DIAGNOSIS — H5213 Myopia, bilateral: Secondary | ICD-10-CM | POA: Diagnosis not present

## 2022-12-14 DIAGNOSIS — H04123 Dry eye syndrome of bilateral lacrimal glands: Secondary | ICD-10-CM | POA: Diagnosis not present

## 2022-12-14 DIAGNOSIS — E119 Type 2 diabetes mellitus without complications: Secondary | ICD-10-CM | POA: Diagnosis not present

## 2022-12-14 LAB — HM DIABETES EYE EXAM

## 2022-12-16 ENCOUNTER — Encounter (HOSPITAL_BASED_OUTPATIENT_CLINIC_OR_DEPARTMENT_OTHER): Payer: Medicare Other | Attending: General Surgery | Admitting: General Surgery

## 2022-12-16 DIAGNOSIS — L97512 Non-pressure chronic ulcer of other part of right foot with fat layer exposed: Secondary | ICD-10-CM | POA: Insufficient documentation

## 2022-12-16 DIAGNOSIS — E1151 Type 2 diabetes mellitus with diabetic peripheral angiopathy without gangrene: Secondary | ICD-10-CM | POA: Insufficient documentation

## 2022-12-16 DIAGNOSIS — I255 Ischemic cardiomyopathy: Secondary | ICD-10-CM | POA: Insufficient documentation

## 2022-12-16 DIAGNOSIS — I4891 Unspecified atrial fibrillation: Secondary | ICD-10-CM | POA: Diagnosis not present

## 2022-12-16 DIAGNOSIS — I110 Hypertensive heart disease with heart failure: Secondary | ICD-10-CM | POA: Diagnosis not present

## 2022-12-16 DIAGNOSIS — E1142 Type 2 diabetes mellitus with diabetic polyneuropathy: Secondary | ICD-10-CM | POA: Insufficient documentation

## 2022-12-16 DIAGNOSIS — E1149 Type 2 diabetes mellitus with other diabetic neurological complication: Secondary | ICD-10-CM | POA: Diagnosis not present

## 2022-12-16 DIAGNOSIS — Z95810 Presence of automatic (implantable) cardiac defibrillator: Secondary | ICD-10-CM | POA: Diagnosis not present

## 2022-12-16 DIAGNOSIS — L84 Corns and callosities: Secondary | ICD-10-CM | POA: Insufficient documentation

## 2022-12-16 DIAGNOSIS — I5022 Chronic systolic (congestive) heart failure: Secondary | ICD-10-CM | POA: Insufficient documentation

## 2022-12-16 DIAGNOSIS — I2510 Atherosclerotic heart disease of native coronary artery without angina pectoris: Secondary | ICD-10-CM | POA: Insufficient documentation

## 2022-12-16 DIAGNOSIS — E11621 Type 2 diabetes mellitus with foot ulcer: Secondary | ICD-10-CM | POA: Insufficient documentation

## 2022-12-16 NOTE — Progress Notes (Signed)
Apply Kerlix and Coban compression as directed. WOUND #2: - Foot Wound Laterality: Dorsal, Left, Medial Cleanser: Soap and Water 1 x Per Day/15 Days Discharge Instructions: May shower and wash wound with dial antibacterial soap and water prior to dressing change. Cleanser: Vashe 5.8 (oz) 1 x Per Day/15 Days Discharge Instructions: Or Cleanse the wound with Vashe prior to applying a clean dressing using gauze sponges, not tissue or cotton balls. Cleanser: Byram Ancillary Kit - 15 Day Supply 1 x Per Day/15  Days Discharge Instructions: Use supplies as instructed; Kit contains: (15) Saline Bullets; (15) 3x3 Gauze; 15 pr Gloves Prim Dressing: Maxorb Extra Ag+ Alginate Dressing, 2x2 (in/in) (DME) (Generic) 1 x Per Day/15 Days ary Discharge Instructions: Apply to wound bed as instructed Secondary Dressing: Bordered Gauze, 2x2 in (DME) (Generic) 1 x Per Day/15 Days Discharge Instructions: Apply over primary dressing as directed. 12/16/2022: This is a 77 year old diabetic presenting with foot ulcers. On the plantar surface of his right foot, first metatarsal head, there is an oval wound with substantial accumulated callus The skin edges are a little bit senescent in appearance and have rolled inward. He has a callus on the corresponding left metatarsal head. There is some discoloration in the center, but no obvious wound opening. On the dorsal surface of his left great toe, there is some denuding of the skin where he pulled off some tape. He says this happened last night. I used a curette to debride callus, slough, and skin from the right foot ulcer. I began to debride the site on the left and found that it was essentially a large thick callus without any underlying wound opening. The dorsal foot wound on the left did not require debridement. We will apply silver alginate to both sites. We will use foam doughnuts to offload the plantar wound on the right. Bordered gauze dressing on the left. Follow-up in about 1 week. Electronic Signature(s) Signed: 12/16/2022 12:29:39 PM By: Duanne Guess MD FACS Entered By: Duanne Guess on 12/16/2022 09:29:39 [name] ([id number]) [id number] Page 8 of 10 -------------------------------------------------------------------------------- HxROS Details Patient Name: Date of Service: [name], [name] [name]. 12/16/2022 9:15 A M Medical Record Number: [id number] Patient Account Number: [id number] Date of Birth/Sex: Treating RN: 31-Dec-1945 (77  y.o. 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; Coronary Artery Disease; Hypertension Past Medical History Notes: 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 Hyperlipidemia Implantable Cardioverter-Defibrillator (ICD) in situ Endocrine Medical History: Positive for: Type II Diabetes Past Medical History Notes: Last Hem A1C 7.2% on 11/21/22 Hypogonadism, Male Time with diabetes: 6 years Treated with: Insulin Genitourinary Medical History: Past Medical History Notes: Nephrolithiasis Integumentary (Skin) Medical History: Past Medical History Notes: Neoplasm of uncertain behavior of skin Callus Right foot Skin Lesion Neurologic Medical History: Positive for: Neuropathy - Diabetic Peripheral Neuropathy Psychiatric Medical History: Past Medical History Notes: General anxiety [name], [name] ([id number]) [id number] Page 9 of 10 HBO Extended History Items Eyes: Cataracts Immunizations Pneumococcal Vaccine: Received Pneumococcal Vaccination: Yes Received Pneumococcal Vaccination On or After 60th Birthday: Yes Implantable Devices Yes Hospitalization / Surgery History Type  Discharge Instructions: Apply to wound bed as instructed Secondary Dressing: Optifoam Non-Adhesive Dressing, 4x4 in (DME) (Generic) 1 x Per Day/15 Days Discharge Instructions: Apply over primary dressing -cut into a "donut" Secondary Dressing: Woven Gauze Sponges 2x2 in (DME) (Generic) 1 x Per Day/15 Days Discharge Instructions: Apply over primary dressing as directed. Secured With: [employer], 4.5x3.1 (in/yd) (DME) (Generic) 1 x Per Day/15 Days Discharge Instructions: Secure with Kerlix as directed. Secured With: Paper Tape, 2x10 (in/yd) (DME) (Generic) 1 x Per Day/15 Days Discharge Instructions: Secure dressing with tape as directed. Compression Wrap: Kerlix Roll 4.5x3.1 (in/yd) 1 x Per Day/15 Days Discharge Instructions: Apply Kerlix and Coban compression as directed. Wound #2 - Foot Wound Laterality: Dorsal, Left, Medial Cleanser: Soap and Water 1 x Per Day/15 Days Discharge Instructions: May shower and wash wound with dial antibacterial soap and water prior to dressing change. Cleanser: Vashe 5.8 (oz) 1 x Per Day/15 Days Discharge  Instructions: Or Cleanse the wound with Vashe prior to applying a clean dressing using gauze sponges, not tissue or cotton balls. Cleanser: Byram Ancillary Kit - 15 Day Supply 1 x Per Day/15 Days Discharge Instructions: Use supplies as instructed; Kit contains: (15) Saline Bullets; (15) 3x3 Gauze; 15 pr Gloves Prim Dressing: Maxorb Extra Ag+ Alginate Dressing, 2x2 (in/in) (DME) (Generic) 1 x Per Day/15 Days ary Discharge Instructions: Apply to wound bed as instructed Secondary Dressing: Bordered Gauze, 2x2 in (DME) (Generic) 1 x Per Day/15 Days Discharge Instructions: Apply over primary dressing as directed. Electronic Signature(s) Signed: 12/16/2022 12:31:34 PM By: Duanne Guess MD FACS Previous Signature: 12/16/2022 12:21:04 PM Version By: Karie Schwalbe RN Entered By: Duanne Guess on 12/16/2022 09:23:17 -------------------------------------------------------------------------------- Problem List Details Patient Name: Date of Service: [name] Aase [name]. 12/16/2022 9:15 A M Medical Record Number: [id number] Patient Account Number: [id number] Date of Birth/Sex: Treating RN: 11-08-1945 (77 y.o. M) Primary Care Provider: Oliver Barre Other Clinician: Referring Provider: Treating Provider/Extender: Duanne Guess PA TEL, PRA YA Randal Buba [name] in Treatment: 0 Active Problems ICD-10 DAION, CIARDULLO ([id number]) [id number] Page 5 of 10 Encounter Code Description Active Date MDM Diagnosis L97.512 Non-pressure chronic ulcer of other part of right foot with fat layer exposed 12/16/2022 No Yes L97.521 Non-pressure chronic ulcer of other part of left foot limited to breakdown of 12/16/2022 No Yes skin L84 Corns and callosities 12/16/2022 No Yes E11.621 Type 2 diabetes mellitus with foot ulcer 12/16/2022 No Yes I73.9 Peripheral vascular disease, unspecified 12/16/2022 No Yes E11.49 Type 2 diabetes mellitus with other diabetic neurological complication  12/16/2022 No Yes Inactive Problems Resolved Problems Electronic Signature(s) Signed: 12/16/2022 12:16:18 PM By: Duanne Guess MD FACS Previous Signature: 12/16/2022 9:41:50 AM Version By: Duanne Guess MD FACS Entered By: Duanne Guess on 12/16/2022 09:16:18 -------------------------------------------------------------------------------- Progress Note Details Patient Name: Date of Service: [name] Aase [name]. 12/16/2022 9:15 A M Medical Record Number: [id number] Patient Account Number: [id number] Date of Birth/Sex: Treating RN: 1946-02-11 (77 y.o. M) Primary Care Provider: Oliver Barre Other Clinician: Referring Provider: Treating Provider/Extender: Duanne Guess PA TEL, PRA YA SHKUMA [name] in Treatment: 0 Subjective Chief Complaint Information obtained from Patient Patients presents for treatment of an open diabetic ulcer History of Present Illness (HPI) ADMISSION 12/16/2022 ***ABIs [name]: 0.95; [name]: 0.88*** This is a 77 year old type II diabetic (last hemoglobin A1c 7.2% on November 20, 2022) with a fairly extensive cardiac and peripheral vascular history. 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 Most recently, he has been followed by a  [name], [name] ([id number]) [id number] Page 1 of 10 Visit Report for 12/16/2022 Chief Complaint Document Details Patient Name: Date of Service: [name], [name] [name]. 12/16/2022 9:15 A M Medical Record Number: [id number] Patient Account Number: [id number] Date of Birth/Sex: Treating RN: 01-May-1945 (77 y.o. M) Primary Care Provider: Oliver Barre Other Clinician: Referring Provider: Treating Provider/Extender: Duanne Guess PA TEL, PRA YA SHKUMA [name] in Treatment: 0 Information Obtained from: Patient Chief Complaint Patients presents for treatment of an open diabetic ulcer Electronic Signature(s) Signed: 12/16/2022 12:17:06 PM By: Duanne Guess MD FACS Entered By: Duanne Guess on 12/16/2022 09:17:05 -------------------------------------------------------------------------------- Debridement Details Patient Name: Date of Service: [name] Aase [name]. 12/16/2022 9:15 A M Medical Record Number: [id number] Patient Account Number: [id number] Date of Birth/Sex: Treating RN: February 14, 1946 (77 y.o. Dianna Limbo Primary Care Provider: Oliver Barre Other Clinician: Referring Provider: Treating Provider/Extender: Duanne Guess PA TEL, PRA YA Randal Buba [name] in Treatment: 0 Debridement Performed for Assessment: Wound #1 Right,Medial,Plantar Foot Performed By: Physician Duanne Guess, MD The following information was scribed by: Karie Schwalbe The information was scribed for: Duanne Guess Debridement Type: Debridement Severity of Tissue Pre Debridement: Fat layer exposed Level of Consciousness (Pre-procedure): Awake and Alert Pre-procedure Verification/Time Out Yes - 10:05 Taken: Start Time: 10:05 Pain Control: Lidocaine 4% T opical Solution Percent of Wound Bed Debrided: 100% T Area Debrided (cm): otal 1.02 Tissue and other material debrided: Viable, Non-Viable, Callus, Slough, Skin: Dermis , Skin: Epidermis, Slough Level:  Skin/Epidermis Debridement Description: Selective/Open Wound Instrument: Curette Bleeding: Minimum Hemostasis Achieved: Pressure End Time: 10:07 Procedural Pain: 0 Post Procedural Pain: 0 Response to Treatment: Procedure was tolerated well Level of Consciousness (Post- Awake and Alert procedure): Post Debridement Measurements of Total Wound Length: (cm) 1.3 Width: (cm) 1 Depth: (cm) 0.3 [name], [name] ([id number]) [id number] Page 2 of 10 Volume: (cm) 0.306 Character of Wound/Ulcer Post Debridement: Improved Severity of Tissue Post Debridement: Fat layer exposed Post Procedure Diagnosis Same as Pre-procedure Electronic Signature(s) Signed: 12/16/2022 12:21:04 PM By: Karie Schwalbe RN Signed: 12/16/2022 12:31:34 PM By: Duanne Guess MD FACS Entered By: Karie Schwalbe on 12/16/2022 07:17:47 -------------------------------------------------------------------------------- HPI Details Patient Name: Date of Service: [name] Aase [name]. 12/16/2022 9:15 A M Medical Record Number: [id number] Patient Account Number: [id number] Date of Birth/Sex: Treating RN: [birth year] (77 y.o. M) Primary Care Provider: Oliver Barre Other Clinician: Referring Provider: Treating Provider/Extender: Duanne Guess PA TEL, PRA YA [facility] [name] in Treatment: 0 History of Present Illness HPI Description: ADMISSION 12/16/2022 ***ABIs [name]: 0.95; [name]: 0.88*** This is a 77 year old type II diabetic (last hemoglobin A1c 7.2% on November 20, 2022) with a fairly extensive cardiac and peripheral vascular history. 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 He says they were at first applying some kind of cream, the name of which she does not remember and have now been applying what he describes as a "some white stuff."  Due to failure of the wounds to heal, he was referred to the wound care center for further evaluation and management. Electronic Signature(s) Signed: 12/16/2022 12:19:35 PM By: Duanne Guess MD FACS Entered By: Duanne Guess on 12/16/2022 09:19:35 -------------------------------------------------------------------------------- Paring/cutting 1 benign hyperkeratotic lesion Details Patient Name: Date of Service: [name], [name] [name]. 12/16/2022 9:15 A M Medical Record Number: [id number] Patient Account Number: [id number] Date of Birth/Sex: Treating RN: Jun 17, 1945 (77 y.o.  of Hospitalization/Surgery Cataract Extraction Implanted Cardiac Defibrillator Family and Social History Unknown History: Yes; Never smoker; Marital Status - Divorced; Alcohol Use: Never;  Drug Use: No History; Caffeine Use: Never; Financial Concerns: No; Food, Clothing or Shelter Needs: No; Support System Lacking: No; Transportation Concerns: No Electronic Signature(s) Signed: 12/16/2022 12:21:04 PM By: Karie Schwalbe RN Signed: 12/16/2022 12:31:34 PM By: Duanne Guess MD FACS Entered By: Karie Schwalbe on 12/16/2022 06:30:40 -------------------------------------------------------------------------------- SuperBill Details Patient Name: Date of Service: [name] Aase [name]. 12/16/2022 Medical Record Number: [id number] Patient Account Number: [id number] Date of Birth/Sex: Treating RN: November 21, 1945 (77 y.o. Dianna Limbo Primary Care Provider: Oliver Barre Other Clinician: Referring Provider: Treating Provider/Extender: Duanne Guess PA TEL, PRA YA [facility] [name] in Treatment: 0 Diagnosis Coding ICD-10 Codes Code Description [phone] Non-pressure chronic ulcer of other part of right foot with fat layer exposed L97.521 Non-pressure chronic ulcer of other part of left foot limited to breakdown of skin L84 Corns and callosities E11.621 Type 2 diabetes mellitus with foot ulcer I73.9 Peripheral vascular disease, unspecified E11.49 Type 2 diabetes mellitus with other diabetic neurological complication Facility Procedures : CPT4 Code: [id number] Description: 99214 - WOUND CARE VISIT-LEV 4 EST PT Modifier: Quantity: 1 : CPT4 Code: 62130865 Description: 97597 - DEBRIDE WOUND 1ST 20 SQ CM OR < ICD-10 Diagnosis Description L97.512 Non-pressure chronic ulcer of other part of right foot with fat layer exposed Modifier: Quantity: 1 : CPT4 Code: [id number] Description: 11055 - PARE BENIGN LES; SGL ICD-10 Diagnosis Description L84 Corns and callosities Modifier: Quantity: 1 Physician Procedures GERAMY, FICKLE ([id number]): CPT4 Code Description [id number] [phone] - WC PHYS LEVEL 4 - NEW PT ICD-10 Diagnosis Description L97.512 Non-pressure chronic ulcer of other part of right foot  with fat L97.521 Non-pressure chronic ulcer of other part of left foot  limited to L84 Corns and callosities E11.621 Type 2 diabetes mellitus with foot ulcer [id number] Page 10 of 10: Quantity Modifier 25 1 layer exposed breakdown of skin [name], [name] ([id number]): [phone] 97597 - WC PHYS DEBR WO ANESTH 20 SQ CM ICD-10 Diagnosis Description L97.512 Non-pressure chronic ulcer of other part of right foot with fat [phone].pdf Page 10 of 10: 1 layer exposed [name], [name] ([id number]): [phone] 11055 - WC PHYS PARE BENIGN LES; SGL ICD-10 Diagnosis Description L84 Corns and callosities [id number] Page 10 of 10: 1 Electronic Signature(s) Signed: 12/16/2022 12:30:23 PM By: Duanne Guess MD FACS Previous Signature: 12/16/2022 12:21:04 PM Version By: Karie Schwalbe RN Entered By: Duanne Guess on 12/16/2022 09:30:22  of Hospitalization/Surgery Cataract Extraction Implanted Cardiac Defibrillator Family and Social History Unknown History: Yes; Never smoker; Marital Status - Divorced; Alcohol Use: Never;  Drug Use: No History; Caffeine Use: Never; Financial Concerns: No; Food, Clothing or Shelter Needs: No; Support System Lacking: No; Transportation Concerns: No Electronic Signature(s) Signed: 12/16/2022 12:21:04 PM By: Karie Schwalbe RN Signed: 12/16/2022 12:31:34 PM By: Duanne Guess MD FACS Entered By: Karie Schwalbe on 12/16/2022 06:30:40 -------------------------------------------------------------------------------- SuperBill Details Patient Name: Date of Service: [name] Aase [name]. 12/16/2022 Medical Record Number: [id number] Patient Account Number: [id number] Date of Birth/Sex: Treating RN: November 21, 1945 (77 y.o. Dianna Limbo Primary Care Provider: Oliver Barre Other Clinician: Referring Provider: Treating Provider/Extender: Duanne Guess PA TEL, PRA YA [facility] [name] in Treatment: 0 Diagnosis Coding ICD-10 Codes Code Description [phone] Non-pressure chronic ulcer of other part of right foot with fat layer exposed L97.521 Non-pressure chronic ulcer of other part of left foot limited to breakdown of skin L84 Corns and callosities E11.621 Type 2 diabetes mellitus with foot ulcer I73.9 Peripheral vascular disease, unspecified E11.49 Type 2 diabetes mellitus with other diabetic neurological complication Facility Procedures : CPT4 Code: [id number] Description: 99214 - WOUND CARE VISIT-LEV 4 EST PT Modifier: Quantity: 1 : CPT4 Code: 62130865 Description: 97597 - DEBRIDE WOUND 1ST 20 SQ CM OR < ICD-10 Diagnosis Description L97.512 Non-pressure chronic ulcer of other part of right foot with fat layer exposed Modifier: Quantity: 1 : CPT4 Code: 78469629 Description: 11055 - PARE BENIGN LES; SGL ICD-10 Diagnosis Description L84 Corns and callosities Modifier: Quantity: 1 Physician Procedures GERAMY, FICKLE ([id number]): CPT4 Code Description [id number] [phone] - WC PHYS LEVEL 4 - NEW PT ICD-10 Diagnosis Description L97.512 Non-pressure chronic ulcer of other part of right foot  with fat L97.521 Non-pressure chronic ulcer of other part of left foot  limited to L84 Corns and callosities E11.621 Type 2 diabetes mellitus with foot ulcer [id number] Page 10 of 10: Quantity Modifier 25 1 layer exposed breakdown of skin [name], [name] ([id number]): [phone] 97597 - WC PHYS DEBR WO ANESTH 20 SQ CM ICD-10 Diagnosis Description L97.512 Non-pressure chronic ulcer of other part of right foot with fat [phone].pdf Page 10 of 10: 1 layer exposed [name], [name] ([id number]): [id number] 11055 - WC PHYS PARE BENIGN LES; SGL ICD-10 Diagnosis Description L84 Corns and callosities [id number] Page 10 of 10: 1 Electronic Signature(s) Signed: 12/16/2022 12:30:23 PM By: Duanne Guess MD FACS Previous Signature: 12/16/2022 12:21:04 PM Version By: Karie Schwalbe RN Entered By: Duanne Guess on 12/16/2022 09:30:22  Apply Kerlix and Coban compression as directed. WOUND #2: - Foot Wound Laterality: Dorsal, Left, Medial Cleanser: Soap and Water 1 x Per Day/15 Days Discharge Instructions: May shower and wash wound with dial antibacterial soap and water prior to dressing change. Cleanser: Vashe 5.8 (oz) 1 x Per Day/15 Days Discharge Instructions: Or Cleanse the wound with Vashe prior to applying a clean dressing using gauze sponges, not tissue or cotton balls. Cleanser: Byram Ancillary Kit - 15 Day Supply 1 x Per Day/15  Days Discharge Instructions: Use supplies as instructed; Kit contains: (15) Saline Bullets; (15) 3x3 Gauze; 15 pr Gloves Prim Dressing: Maxorb Extra Ag+ Alginate Dressing, 2x2 (in/in) (DME) (Generic) 1 x Per Day/15 Days ary Discharge Instructions: Apply to wound bed as instructed Secondary Dressing: Bordered Gauze, 2x2 in (DME) (Generic) 1 x Per Day/15 Days Discharge Instructions: Apply over primary dressing as directed. 12/16/2022: This is a 77 year old diabetic presenting with foot ulcers. On the plantar surface of his right foot, first metatarsal head, there is an oval wound with substantial accumulated callus The skin edges are a little bit senescent in appearance and have rolled inward. He has a callus on the corresponding left metatarsal head. There is some discoloration in the center, but no obvious wound opening. On the dorsal surface of his left great toe, there is some denuding of the skin where he pulled off some tape. He says this happened last night. I used a curette to debride callus, slough, and skin from the right foot ulcer. I began to debride the site on the left and found that it was essentially a large thick callus without any underlying wound opening. The dorsal foot wound on the left did not require debridement. We will apply silver alginate to both sites. We will use foam doughnuts to offload the plantar wound on the right. Bordered gauze dressing on the left. Follow-up in about 1 week. Electronic Signature(s) Signed: 12/16/2022 12:29:39 PM By: Duanne Guess MD FACS Entered By: Duanne Guess on 12/16/2022 09:29:39 [name] ([id number]) [id number] Page 8 of 10 -------------------------------------------------------------------------------- HxROS Details Patient Name: Date of Service: [name], [name] [name]. 12/16/2022 9:15 A M Medical Record Number: [id number] Patient Account Number: [id number] Date of Birth/Sex: Treating RN: 31-Dec-1945 (77  y.o. 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; Coronary Artery Disease; Hypertension Past Medical History Notes: 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 Hyperlipidemia Implantable Cardioverter-Defibrillator (ICD) in situ Endocrine Medical History: Positive for: Type II Diabetes Past Medical History Notes: Last Hem A1C 7.2% on 11/21/22 Hypogonadism, Male Time with diabetes: 6 years Treated with: Insulin Genitourinary Medical History: Past Medical History Notes: Nephrolithiasis Integumentary (Skin) Medical History: Past Medical History Notes: Neoplasm of uncertain behavior of skin Callus Right foot Skin Lesion Neurologic Medical History: Positive for: Neuropathy - Diabetic Peripheral Neuropathy Psychiatric Medical History: Past Medical History Notes: General anxiety [name], [name] ([id number]) [id number] Page 9 of 10 HBO Extended History Items Eyes: Cataracts Immunizations Pneumococcal Vaccine: Received Pneumococcal Vaccination: Yes Received Pneumococcal Vaccination On or After 60th Birthday: Yes Implantable Devices Yes Hospitalization / Surgery History Type  [name], [name] ([id number]) [id number] Page 1 of 10 Visit Report for 12/16/2022 Chief Complaint Document Details Patient Name: Date of Service: [name], [name] [name]. 12/16/2022 9:15 A M Medical Record Number: [id number] Patient Account Number: [id number] Date of Birth/Sex: Treating RN: 01-May-1945 (77 y.o. M) Primary Care Provider: Oliver Barre Other Clinician: Referring Provider: Treating Provider/Extender: Duanne Guess PA TEL, PRA YA SHKUMA [name] in Treatment: 0 Information Obtained from: Patient Chief Complaint Patients presents for treatment of an open diabetic ulcer Electronic Signature(s) Signed: 12/16/2022 12:17:06 PM By: Duanne Guess MD FACS Entered By: Duanne Guess on 12/16/2022 09:17:05 -------------------------------------------------------------------------------- Debridement Details Patient Name: Date of Service: [name] Aase [name]. 12/16/2022 9:15 A M Medical Record Number: [id number] Patient Account Number: [id number] Date of Birth/Sex: Treating RN: February 14, 1946 (77 y.o. Dianna Limbo Primary Care Provider: Oliver Barre Other Clinician: Referring Provider: Treating Provider/Extender: Duanne Guess PA TEL, PRA YA Randal Buba [name] in Treatment: 0 Debridement Performed for Assessment: Wound #1 Right,Medial,Plantar Foot Performed By: Physician Duanne Guess, MD The following information was scribed by: Karie Schwalbe The information was scribed for: Duanne Guess Debridement Type: Debridement Severity of Tissue Pre Debridement: Fat layer exposed Level of Consciousness (Pre-procedure): Awake and Alert Pre-procedure Verification/Time Out Yes - 10:05 Taken: Start Time: 10:05 Pain Control: Lidocaine 4% T opical Solution Percent of Wound Bed Debrided: 100% T Area Debrided (cm): otal 1.02 Tissue and other material debrided: Viable, Non-Viable, Callus, Slough, Skin: Dermis , Skin: Epidermis, Slough Level:  Skin/Epidermis Debridement Description: Selective/Open Wound Instrument: Curette Bleeding: Minimum Hemostasis Achieved: Pressure End Time: 10:07 Procedural Pain: 0 Post Procedural Pain: 0 Response to Treatment: Procedure was tolerated well Level of Consciousness (Post- Awake and Alert procedure): Post Debridement Measurements of Total Wound Length: (cm) 1.3 Width: (cm) 1 Depth: (cm) 0.3 [name], [name] ([id number]) [id number] Page 2 of 10 Volume: (cm) 0.306 Character of Wound/Ulcer Post Debridement: Improved Severity of Tissue Post Debridement: Fat layer exposed Post Procedure Diagnosis Same as Pre-procedure Electronic Signature(s) Signed: 12/16/2022 12:21:04 PM By: Karie Schwalbe RN Signed: 12/16/2022 12:31:34 PM By: Duanne Guess MD FACS Entered By: Karie Schwalbe on 12/16/2022 07:17:47 -------------------------------------------------------------------------------- HPI Details Patient Name: Date of Service: [name] Aase [name]. 12/16/2022 9:15 A M Medical Record Number: [id number] Patient Account Number: [id number] Date of Birth/Sex: Treating RN: [birth year] (77 y.o. M) Primary Care Provider: Oliver Barre Other Clinician: Referring Provider: Treating Provider/Extender: Duanne Guess PA TEL, PRA YA [facility] [name] in Treatment: 0 History of Present Illness HPI Description: ADMISSION 12/16/2022 ***ABIs [name]: 0.95; [name]: 0.88*** This is a 77 year old type II diabetic (last hemoglobin A1c 7.2% on November 20, 2022) with a fairly extensive cardiac and peripheral vascular history. 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 He says they were at first applying some kind of cream, the name of which she does not remember and have now been applying what he describes as a "some white stuff."  Due to failure of the wounds to heal, he was referred to the wound care center for further evaluation and management. Electronic Signature(s) Signed: 12/16/2022 12:19:35 PM By: Duanne Guess MD FACS Entered By: Duanne Guess on 12/16/2022 09:19:35 -------------------------------------------------------------------------------- Paring/cutting 1 benign hyperkeratotic lesion Details Patient Name: Date of Service: [name], [name] [name]. 12/16/2022 9:15 A M Medical Record Number: [id number] Patient Account Number: [id number] Date of Birth/Sex: Treating RN: Jun 17, 1945 (77 y.o.

## 2022-12-16 NOTE — Progress Notes (Signed)
[name], [name] ([id number]) [id number] Page 1 of 4 Visit Report for 12/16/2022 Abuse Risk Screen Details Patient Name: Date of Service: [name], [name]. 12/16/2022 9:15 A M Medical Record Number: [id number] Patient Account Number: [id number] Date of Birth/Sex: Treating RN: Jun 29, 1945 (77 y.o. [name] Primary Care [name]: [name] Other Clinician: Referring [name]: Treating [name]/Extender: [name] Guess PA TEL, PRA YA SHKUMA [name]: 0 Abuse Risk Screen Items Answer ABUSE RISK SCREEN: Has anyone close to you tried to hurt or harm you recentlyo No Do you feel uncomfortable with anyone in your familyo No Has anyone forced you do things that you didnt want to doo No Electronic Signature(s) Signed: 12/16/2022 12:21:04 PM By: [name] Schwalbe RN Entered By: [name] on 12/16/2022 06:30:47 -------------------------------------------------------------------------------- Activities of Daily Living Details Patient Name: Date of Service: [name], [name]. 12/16/2022 9:15 A M Medical Record Number: [id number] Patient Account Number: [id number] Date of Birth/Sex: Treating RN: 09-13-1945 (77 y.o. [name] Primary Care Hadlyn Amero: [name] Other Clinician: Referring [name]: Treating [name]/Extender: [name] Guess PA TEL, PRA YA SHKUMA [name]: 0 Activities of Daily Living Items Answer Activities of Daily Living (Please select one for each item) Drive Automobile Completely Able T Medications ake Completely Able Use T elephone Completely Able Care for Appearance Completely Able Use T oilet Completely Able Bath / Shower Completely Able Dress Self Completely Able Feed Self Completely Able Walk Completely Able Get In / Out Bed Completely Able Housework Completely Able Prepare Meals Completely Able Handle Money Completely Able Shop for Self Completely Able Electronic  Signature(s) Signed: 12/16/2022 12:21:04 PM By: [name] Schwalbe RN Entered By: [name] on 12/16/2022 06:31:30 [name] ([id number]) [id number] Page 2 of 4 -------------------------------------------------------------------------------- Education Screening Details Patient Name: Date of Service: [name], [name]. 12/16/2022 9:15 A M Medical Record Number: [id number] Patient Account Number: [id number] Date of Birth/Sex: Treating RN: Jul 03, 1945 (77 y.o. [name] Primary Care Deiontae Rabel: [name] Other Clinician: Referring [name]: Treating [name]/Extender: [name] Guess PA TEL, PRA YA [name] Weeks in [name]: 0 Primary Learner Assessed: Patient Learning Preferences/Education Level/Primary Language Learning Preference: Explanation, Demonstration, Printed Material Highest Education Level: College or Above Preferred Language: English Cognitive Barrier Language Barrier: No Translator Needed: No Memory Deficit: No Emotional Barrier: No Cultural/Religious Beliefs Affecting Medical Care: No Physical Barrier Impaired Vision: No Impaired Hearing: No Decreased Hand dexterity: No Knowledge/Comprehension Knowledge Level: High Comprehension Level: High Ability to understand written instructions: High Ability to understand verbal instructions: High Motivation Anxiety Level: Calm Cooperation: Cooperative Education Importance: Acknowledges Need Interest in Health Problems: Asks Questions Perception: Coherent Willingness to Engage in Self-Management High Activities: Readiness to Engage in Self-Management High Activities: Electronic Signature(s) Signed: 12/16/2022 12:21:04 PM By: [name] Schwalbe RN Entered By: [name] on 12/16/2022 06:32:49 -------------------------------------------------------------------------------- Fall Risk Assessment Details Patient Name: Date of Service: [name]. 12/16/2022 9:15 A  M Medical Record Number: [id number] Patient Account Number: [id number] Date of Birth/Sex: Treating RN: 1946-01-26 (77 y.o. [name] Primary Care [name]: [name] Other Clinician: Referring [name]: Treating [name]/Extender: [name] Guess PA TEL, PRA YA [name] Buba [name]: 0 Fall Risk Assessment Items Have you had 2 or more falls in the last [address] Yes [place], [name] ([id number]) [id number] Page 3 of 4 Have you had any fall that resulted in injury in the last 12 monthso 0 No FALLS RISK SCREEN History of falling - immediate or within 3  months 0 No Secondary diagnosis (Do you have 2 or more medical diagnoseso) 0 No Ambulatory aid None/bed rest/wheelchair/nurse 0 No Crutches/cane/walker 0 No Furniture 0 No Intravenous therapy Access/Saline/Heparin Lock 0 No Gait/Transferring Normal/ bed rest/ wheelchair 0 No Weak (short steps with or without shuffle, stooped but able to lift head while walking, may seek 10 Yes support from furniture) Impaired (short steps with shuffle, may have difficulty arising from chair, head down, impaired 0 No balance) Mental Status Oriented to own ability 0 No Electronic Signature(s) Signed: 12/16/2022 12:21:04 PM By: [name] Schwalbe RN Entered By: [name] on 12/16/2022 06:34:11 -------------------------------------------------------------------------------- Foot Assessment Details Patient Name: Date of Service: [name]. 12/16/2022 9:15 A M Medical Record Number: [id number] Patient Account Number: [id number] Date of Birth/Sex: Treating RN: 1945/11/20 (77 y.o. [name] Primary Care [name]: [name] Other Clinician: Referring [name]: Treating [name]/Extender: [name] Guess PA TEL, PRA YA SHKUMA [name]: 0 Foot Assessment Items Site Locations + = Sensation present, - = Sensation absent, C = Callus, U = Ulcer [name] = Redness, W = Warmth, M =  Maceration, PU = Pre-ulcerative lesion F = Fissure, S = Swelling, D = Dryness Assessment Right: Left: Other Deformity: No No Prior Foot Ulcer: No No Prior Amputation: Yes No Charcot Joint: No No Ambulatory Status: Ambulatory Without Help GaitDENZALE, MOYA ([id number]) [phone] [id number] Page 4 of 4 Notes Trans metatarsal amputation of right foot Electronic Signature(s) Signed: 12/16/2022 12:21:04 PM By: [name] Schwalbe RN Entered By: [name] on 12/16/2022 06:42:02 -------------------------------------------------------------------------------- Nutrition Risk Screening Details Patient Name: Date of Service: [name], [name]. 12/16/2022 9:15 A M Medical Record Number: [id number] Patient Account Number: [id number] Date of Birth/Sex: Treating RN: 1945/10/08 (77 y.o. [name] Primary Care Yavier Snider: [name] Other Clinician: Referring Lynda Wanninger: Treating Xela Oregel/Extender: [name] Guess PA TEL, PRA YA SHKUMA [name]: 0 Height (in): 70 Weight (lbs): 186 Body Mass Index (BMI): 26.7 Nutrition Risk Screening Items Score Screening NUTRITION RISK SCREEN: I have an illness or condition that made me change the kind and/or amount of food I eat 0 No I eat fewer than two meals per day 0 No I eat few fruits and vegetables, or milk products 0 No I have three or more drinks of beer, liquor or wine almost every day 0 No I have tooth or mouth problems that make it hard for me to eat 0 No I don't always have enough money to buy the food I need 0 No I eat alone most of the time 0 No I take three or more different prescribed or over-the-counter drugs a day 1 Yes Without wanting to, I have lost or gained 10 pounds in the last six months 0 No I am not always physically able to shop, cook and/or feed myself 0 No Nutrition Protocols Good Risk Protocol 0 No interventions needed Moderate Risk Protocol High Risk Proctocol Risk  Level: Good Risk Score: 1 Electronic Signature(s) Signed: 12/16/2022 12:21:04 PM By: [name] Schwalbe RN Entered By: [name] on 12/16/2022 06:36:56

## 2022-12-16 NOTE — Progress Notes (Signed)
[name], [name] ([id number]) [id number] Page 1 of 11 Visit Report for 12/16/2022 Allergy List Details Patient Name: Date of Service: [name], [name] MES L. 12/16/2022 9:15 A M Medical Record Number: [id number] Patient Account Number: [id number] Date of Birth/Sex: Treating RN: 10-22-1945 (77 y.o. Dianna Limbo Primary Care Selina Tapper: Oliver Barre Other Clinician: Referring Anasha Perfecto: Treating Raegen Tarpley/Extender: Duanne Guess PA TEL, PRA YA [facility] R Weeks in Treatment: 0 Allergies Active Allergies Salmon Severity: Severe Type: Food Sulfa Antibiotics Severity: Severe Type: Medication Allergy Notes Electronic Signature(s) Signed: 12/16/2022 12:21:04 PM By: Karie Schwalbe RN Entered By: Karie Schwalbe on 12/16/2022 06:27:00 -------------------------------------------------------------------------------- Arrival Information Details Patient Name: Date of Service: [name] Aase MES L. 12/16/2022 9:15 A M Medical Record Number: [id number] Patient Account Number: [id number] Date of Birth/Sex: Treating RN: 1946-01-02 (77 y.o. M) Primary Care Onyinyechi Huante: Oliver Barre Other Clinician: Referring Sanad Fearnow: Treating Matthew Pais/Extender: Duanne Guess PA TEL, PRA YA SHKUMA R Weeks in Treatment: 0 Visit Information Patient Arrived: Ambulatory Arrival Time: 09:16 Accompanied By: self Transfer Assistance: None Patient Identification Verified: Yes Secondary Verification Process Completed: Yes Patient Has Alerts: Yes Patient Alerts: Patient on Blood Thinner Xarelto Electronic Signature(s) Signed: 12/16/2022 12:21:04 PM By: Karie Schwalbe RN Entered By: Karie Schwalbe on 12/16/2022 06:26:36 [name], [name] ([id number]) [id number] Page 2 of 11 -------------------------------------------------------------------------------- Clinic Level of Care Assessment Details Patient Name: Date of Service: [name], [name] MES L. 12/16/2022 9:15 A M Medical Record  Number: [id number] Patient Account Number: [id number] Date of Birth/Sex: Treating RN: Jan 21, 1946 (77 y.o. Dianna Limbo Primary Care Zakaree Mcclenahan: Oliver Barre Other Clinician: Referring Inocencia Murtaugh: Treating Yaasir Menken/Extender: Duanne Guess PA TEL, PRA YA SHKUMA R Weeks in Treatment: 0 Clinic Level of Care Assessment Items TOOL 1 Quantity Score X- 1 0 Use when EandM and Procedure is performed on INITIAL visit ASSESSMENTS - Nursing Assessment / Reassessment X- 1 20 General Physical Exam (combine w/ comprehensive assessment (listed just below) when performed on new pt. evals) X- 1 25 Comprehensive Assessment (HX, ROS, Risk Assessments, Wounds Hx, etc.) ASSESSMENTS - Wound and Skin Assessment / Reassessment X- 1 10 Dermatologic / Skin Assessment (not related to wound area) ASSESSMENTS - Ostomy and/or Continence Assessment and Care []  - 0 Incontinence Assessment and Management []  - 0 Ostomy Care Assessment and Management (repouching, etc.) PROCESS - Coordination of Care X - Simple Patient / Family Education for ongoing care 1 15 []  - 0 Complex (extensive) Patient / Family Education for ongoing care X- 1 10 Staff obtains Chiropractor, Records, T Results / Process Orders est X- 1 10 Staff telephones HHA, Nursing Homes / Clarify orders / etc []  - 0 Routine Transfer to another Facility (non-emergent condition) []  - 0 Routine Hospital Admission (non-emergent condition) X- 1 15 New Admissions / Manufacturing engineer / Ordering NPWT Apligraf, etc. , []  - 0 Emergency Hospital Admission (emergent condition) PROCESS - Special Needs []  - 0 Pediatric / Minor Patient Management []  - 0 Isolation Patient Management []  - 0 Hearing / Language / Visual special needs []  - 0 Assessment of Community assistance (transportation, D/C planning, etc.) []  - 0 Additional assistance / Altered mentation []  - 0 Support Surface(s) Assessment (bed, cushion, seat, etc.) INTERVENTIONS -  Miscellaneous []  - 0 External ear exam []  - 0 Patient Transfer (multiple staff / Nurse, adult / Similar devices) []  - 0 Simple Staple / Suture removal (25 or less) []  - 0 Complex Staple / Suture removal (26 or more) []  - 0 Hypo/Hyperglycemic Management (do not check if billed separately) X- 1 77  y.o. Dianna Limbo Primary Care Bralen Wiltgen: Oliver Barre Other Clinician: Referring Amylah Will: Treating Latravious Levitt/Extender: Duanne Guess PA TEL, PRA YA SHKUMA R Weeks in Treatment: 0 Active Inactive Wound/Skin Impairment Nursing Diagnoses: Impaired tissue integrity Goals: Patient/caregiver will verbalize understanding of skin care regimen Date Initiated: 12/16/2022 Target Resolution Date:  02/28/2023 Goal Status: Active Interventions: Assess ulceration(s) every visit Treatment Activities: Skin care regimen initiated : 12/16/2022 Notes: WAI, SOLT ([id number]) [id number] Page 7 of 11 Electronic Signature(s) Signed: 12/16/2022 12:21:04 PM By: Karie Schwalbe RN Entered By: Karie Schwalbe on 12/16/2022 08:37:52 -------------------------------------------------------------------------------- Pain Assessment Details Patient Name: Date of Service: [name] Aase MES L. 12/16/2022 9:15 A M Medical Record Number: [id number] Patient Account Number: [id number] Date of Birth/Sex: Treating RN: 07/19/1945 (77 y.o. Dianna Limbo Primary Care Tyrone Pautsch: Oliver Barre Other Clinician: Referring Cadyn Rodger: Treating Keylah Darwish/Extender: Duanne Guess PA TEL, PRA YA SHKUMA R Weeks in Treatment: 0 Active Problems Location of Pain Severity and Description of Pain Patient Has Paino No Site Locations Pain Management and Medication Current Pain Management: Electronic Signature(s) Signed: 12/16/2022 12:21:04 PM By: Karie Schwalbe RN Entered By: Karie Schwalbe on 12/16/2022 07:03:18 -------------------------------------------------------------------------------- Patient/Caregiver Education Details Patient Name: Date of Service: [name] Aase MES L. 10/18/2024andnbsp9:15 A M Medical Record Number: [id number] Patient Account Number: [id number] Date of Birth/Gender: Treating RN: 1945/05/06 (77 y.o. Dianna Limbo Primary Care Physician: Oliver Barre Other Clinician: Referring Physician: Treating Physician/Extender: Duanne Guess PA TEL, PRA YA Ernst Bowler in Treatment: 0 Education Assessment Education Provided To: Patient [name], [name] ([id number]) [id number] Page 8 of 11 Education Topics Provided Wound/Skin Impairment: Methods: Demonstration, Explain/Verbal Responses: State content correctly Electronic  Signature(s) Signed: 12/16/2022 12:21:04 PM By: Karie Schwalbe RN Entered By: Karie Schwalbe on 12/16/2022 08:41:17 -------------------------------------------------------------------------------- Wound Assessment Details Patient Name: Date of Service: [name] Aase MES L. 12/16/2022 9:15 A M Medical Record Number: [id number] Patient Account Number: [id number] Date of Birth/Sex: Treating RN: 02-26-1946 (77 y.o. M) Primary Care Alain Deschene: Oliver Barre Other Clinician: Referring Dinesha Twiggs: Treating Ashlyne Olenick/Extender: Duanne Guess PA TEL, PRA YA SHKUMA R Weeks in Treatment: 0 Wound Status Wound Number: 1 Primary Diabetic Wound/Ulcer of the Lower Extremity Etiology: Wound Location: Right, Medial, Plantar Foot Wound Open Wounding Event: Gradually Appeared Status: Date Acquired: 05/30/2022 Comorbid Cataracts, Arrhythmia, Congestive Heart Failure, Coronary Artery Weeks Of Treatment: 0 History: Disease, Hypertension, Type II Diabetes, Neuropathy Clustered Wound: No Photos Wound Measurements Length: (cm) 1.3 Width: (cm) 1 Depth: (cm) 0.3 Area: (cm) 1.021 Volume: (cm) 0.306 % Reduction in Area: % Reduction in Volume: Epithelialization: Small (1-33%) Tunneling: No Undermining: Yes Starting Position (o'clock): 6 Ending Position (o'clock): 2 Maximum Distance: (cm) 0.5 Wound Description Classification: Grade 2 Wound Margin: Thickened Exudate Amount: Medium Exudate Type: Serosanguineous Exudate Color: red, brown Foul Odor After Cleansing: No Slough/Fibrino Yes Wound Bed Granulation Amount: Medium (34-66%) Exposed Structure Granulation Quality: Red Fascia Exposed: No Necrotic Amount: Medium (34-66%) Fat Layer (Subcutaneous Tissue) Exposed: No Necrotic Quality: Eschar, Adherent Slough Tendon Exposed: No Helvie, Philo L ([id number]) [id number] Page 9 of 11 Muscle Exposed: No Joint Exposed: No Bone Exposed: No Periwound Skin Texture Texture Color No  Abnormalities Noted: No No Abnormalities Noted: Yes Callus: Yes Temperature / Pain Temperature: No Abnormality Moisture No Abnormalities Noted: Yes Treatment Notes Wound #1 (Foot) Wound Laterality: Plantar, Right, Medial Cleanser Soap and Water Discharge Instruction: May shower and wash wound with dial antibacterial soap and water prior to dressing change. Vashe 5.8 (oz) Discharge Instruction: Or Cleanse the wound with Vashe prior to applying a clean  5 of 11 1.021 0.071 N/A A (cm) : rea 0.306 0.007 N/A Volume (cm) : 6 Starting Position 1 (o'clock): 2 Ending Position 1 (o'clock): 0.5 Maximum Distance 1 (cm): Yes No N/A Undermining: Grade 2 Grade 1 N/A Classification: Medium Medium N/A Exudate A mount: Serosanguineous Serosanguineous N/A Exudate Type: red, brown red, brown N/A Exudate Color: Thickened Flat and Intact N/A Wound Margin: Medium (34-66%) Large (67-100%) N/A Granulation A mount: Red Red N/A Granulation Quality: Medium (34-66%) Small (1-33%) N/A Necrotic A mount: Eschar, Adherent Slough Eschar, Adherent Slough N/A Necrotic Tissue: Fascia: No Fascia: No N/A Exposed Structures: Fat Layer (Subcutaneous Tissue): No Fat Layer (Subcutaneous Tissue): No Tendon: No Tendon: No Muscle: No Muscle: No Joint:  No Joint: No Bone: No Bone: No Small (1-33%) Small (1-33%) N/A Epithelialization: Debridement - Selective/Open Wound N/A N/A Debridement: Pre-procedure Verification/Time Out 10:05 N/A N/A Taken: Lidocaine 4% T opical Solution N/A N/A Pain Control: Callus, Slough N/A N/A Tissue Debrided: Skin/Epidermis N/A N/A Level: 1.02 N/A N/A Debridement A (sq cm): rea Curette N/A N/A Instrument: Minimum N/A N/A Bleeding: Pressure N/A N/A Hemostasis A chieved: 0 N/A N/A Procedural Pain: 0 N/A N/A Post Procedural Pain: Procedure was tolerated well N/A N/A Debridement Treatment Response: 1.3x1x0.3 N/A N/A Post Debridement Measurements L x W x D (cm) 0.306 N/A N/A Post Debridement Volume: (cm) Callus: Yes No Abnormalities Noted N/A Periwound Skin Texture: No Abnormalities Noted No Abnormalities Noted N/A Periwound Skin Moisture: No Abnormalities Noted No Abnormalities Noted N/A Periwound Skin Color: No Abnormality No Abnormality N/A Temperature: Debridement N/A N/A Procedures Performed: Treatment Notes Wound #1 (Foot) Wound Laterality: Plantar, Right, Medial Cleanser Soap and Water Discharge Instruction: May shower and wash wound with dial antibacterial soap and water prior to dressing change. Vashe 5.8 (oz) Discharge Instruction: Or Cleanse the wound with Vashe prior to applying a clean dressing using gauze sponges, not tissue or cotton balls. Peri-Wound Care Topical Primary Dressing Maxorb Extra Ag+ Alginate Dressing, 2x2 (in/in) Discharge Instruction: Apply to wound bed as instructed Secondary Dressing Optifoam Non-Adhesive Dressing, 4x4 in Discharge Instruction: Apply over primary dressing -cut into a "donut" Woven Gauze Sponges 2x2 in Discharge Instruction: Apply over primary dressing as directed. Secured With [employer], 4.5x3.1 (in/yd) Discharge Instruction: Secure with Kerlix as directed. Paper Tape, 2x10 (in/yd) Discharge Instruction: Secure  dressing with tape as directed. Compression Wrap Kerlix Roll 4.5x3.1 (in/yd) Discharge Instruction: Apply Kerlix and Coban compression as directed. Compression Stockings [name], [name] ([id number]) [id number] Page 6 of 11 Add-Ons Wound #2 (Foot) Wound Laterality: Dorsal, Left, Medial Cleanser Soap and Water Discharge Instruction: May shower and wash wound with dial antibacterial soap and water prior to dressing change. Vashe 5.8 (oz) Discharge Instruction: Or Cleanse the wound with Vashe prior to applying a clean dressing using gauze sponges, not tissue or cotton balls. [employer]ram Ancillary Kit - 15 Day Supply Discharge Instruction: Use supplies as instructed; Kit contains: (15) Saline Bullets; (15) 3x3 Gauze; 15 pr Gloves Peri-Wound Care Topical Primary Dressing Maxorb Extra Ag+ Alginate Dressing, 2x2 (in/in) Discharge Instruction: Apply to wound bed as instructed Secondary Dressing Bordered Gauze, 2x2 in Discharge Instruction: Apply over primary dressing as directed. Secured With Compression Wrap Compression Stockings Facilities manager) Signed: 12/16/2022 12:16:51 PM By: Duanne Guess MD FACS Entered By: Duanne Guess on 12/16/2022 09:16:51 -------------------------------------------------------------------------------- Multi-Disciplinary Care Plan Details Patient Name: Date of Service: [name] Aase MES L. 12/16/2022 9:15 A M Medical Record Number: [id number] Patient Account Number: [id number] Date of Birth/Sex: Treating RN: 03/10/1945 (77  [name], [name] ([id number]) [id number] Page 1 of 11 Visit Report for 12/16/2022 Allergy List Details Patient Name: Date of Service: [name], [name] MES L. 12/16/2022 9:15 A M Medical Record Number: [id number] Patient Account Number: [id number] Date of Birth/Sex: Treating RN: 10-22-1945 (77 y.o. Dianna Limbo Primary Care Selina Tapper: Oliver Barre Other Clinician: Referring Anasha Perfecto: Treating Raegen Tarpley/Extender: Duanne Guess PA TEL, PRA YA [facility] R Weeks in Treatment: 0 Allergies Active Allergies Salmon Severity: Severe Type: Food Sulfa Antibiotics Severity: Severe Type: Medication Allergy Notes Electronic Signature(s) Signed: 12/16/2022 12:21:04 PM By: Karie Schwalbe RN Entered By: Karie Schwalbe on 12/16/2022 06:27:00 -------------------------------------------------------------------------------- Arrival Information Details Patient Name: Date of Service: [name] Aase MES L. 12/16/2022 9:15 A M Medical Record Number: [id number] Patient Account Number: [id number] Date of Birth/Sex: Treating RN: 1946-01-02 (77 y.o. M) Primary Care Onyinyechi Huante: Oliver Barre Other Clinician: Referring Sanad Fearnow: Treating Matthew Pais/Extender: Duanne Guess PA TEL, PRA YA SHKUMA R Weeks in Treatment: 0 Visit Information Patient Arrived: Ambulatory Arrival Time: 09:16 Accompanied By: self Transfer Assistance: None Patient Identification Verified: Yes Secondary Verification Process Completed: Yes Patient Has Alerts: Yes Patient Alerts: Patient on Blood Thinner Xarelto Electronic Signature(s) Signed: 12/16/2022 12:21:04 PM By: Karie Schwalbe RN Entered By: Karie Schwalbe on 12/16/2022 06:26:36 [name], [name] ([id number]) [id number] Page 2 of 11 -------------------------------------------------------------------------------- Clinic Level of Care Assessment Details Patient Name: Date of Service: [name], [name] MES L. 12/16/2022 9:15 A M Medical Record  Number: [id number] Patient Account Number: [id number] Date of Birth/Sex: Treating RN: Jan 21, 1946 (77 y.o. Dianna Limbo Primary Care Zakaree Mcclenahan: Oliver Barre Other Clinician: Referring Inocencia Murtaugh: Treating Yaasir Menken/Extender: Duanne Guess PA TEL, PRA YA SHKUMA R Weeks in Treatment: 0 Clinic Level of Care Assessment Items TOOL 1 Quantity Score X- 1 0 Use when EandM and Procedure is performed on INITIAL visit ASSESSMENTS - Nursing Assessment / Reassessment X- 1 20 General Physical Exam (combine w/ comprehensive assessment (listed just below) when performed on new pt. evals) X- 1 25 Comprehensive Assessment (HX, ROS, Risk Assessments, Wounds Hx, etc.) ASSESSMENTS - Wound and Skin Assessment / Reassessment X- 1 10 Dermatologic / Skin Assessment (not related to wound area) ASSESSMENTS - Ostomy and/or Continence Assessment and Care []  - 0 Incontinence Assessment and Management []  - 0 Ostomy Care Assessment and Management (repouching, etc.) PROCESS - Coordination of Care X - Simple Patient / Family Education for ongoing care 1 15 []  - 0 Complex (extensive) Patient / Family Education for ongoing care X- 1 10 Staff obtains Chiropractor, Records, T Results / Process Orders est X- 1 10 Staff telephones HHA, Nursing Homes / Clarify orders / etc []  - 0 Routine Transfer to another Facility (non-emergent condition) []  - 0 Routine Hospital Admission (non-emergent condition) X- 1 15 New Admissions / Manufacturing engineer / Ordering NPWT Apligraf, etc. , []  - 0 Emergency Hospital Admission (emergent condition) PROCESS - Special Needs []  - 0 Pediatric / Minor Patient Management []  - 0 Isolation Patient Management []  - 0 Hearing / Language / Visual special needs []  - 0 Assessment of Community assistance (transportation, D/C planning, etc.) []  - 0 Additional assistance / Altered mentation []  - 0 Support Surface(s) Assessment (bed, cushion, seat, etc.) INTERVENTIONS -  Miscellaneous []  - 0 External ear exam []  - 0 Patient Transfer (multiple staff / Nurse, adult / Similar devices) []  - 0 Simple Staple / Suture removal (25 or less) []  - 0 Complex Staple / Suture removal (26 or more) []  - 0 Hypo/Hyperglycemic Management (do not check if billed separately) X- 1 15  [name], [name] ([id number]) [id number] Page 1 of 11 Visit Report for 12/16/2022 Allergy List Details Patient Name: Date of Service: [name], [name] MES L. 12/16/2022 9:15 A M Medical Record Number: [id number] Patient Account Number: [id number] Date of Birth/Sex: Treating RN: 10-22-1945 (77 y.o. Dianna Limbo Primary Care Selina Tapper: Oliver Barre Other Clinician: Referring Anasha Perfecto: Treating Raegen Tarpley/Extender: Duanne Guess PA TEL, PRA YA [facility] R Weeks in Treatment: 0 Allergies Active Allergies Salmon Severity: Severe Type: Food Sulfa Antibiotics Severity: Severe Type: Medication Allergy Notes Electronic Signature(s) Signed: 12/16/2022 12:21:04 PM By: Karie Schwalbe RN Entered By: Karie Schwalbe on 12/16/2022 06:27:00 -------------------------------------------------------------------------------- Arrival Information Details Patient Name: Date of Service: [name] Aase MES L. 12/16/2022 9:15 A M Medical Record Number: [id number] Patient Account Number: [id number] Date of Birth/Sex: Treating RN: 1946-01-02 (77 y.o. M) Primary Care Onyinyechi Huante: Oliver Barre Other Clinician: Referring Sanad Fearnow: Treating Matthew Pais/Extender: Duanne Guess PA TEL, PRA YA SHKUMA R Weeks in Treatment: 0 Visit Information Patient Arrived: Ambulatory Arrival Time: 09:16 Accompanied By: self Transfer Assistance: None Patient Identification Verified: Yes Secondary Verification Process Completed: Yes Patient Has Alerts: Yes Patient Alerts: Patient on Blood Thinner Xarelto Electronic Signature(s) Signed: 12/16/2022 12:21:04 PM By: Karie Schwalbe RN Entered By: Karie Schwalbe on 12/16/2022 06:26:36 [name], [name] ([id number]) [id number] Page 2 of 11 -------------------------------------------------------------------------------- Clinic Level of Care Assessment Details Patient Name: Date of Service: [name], [name] MES L. 12/16/2022 9:15 A M Medical Record  Number: [id number] Patient Account Number: [id number] Date of Birth/Sex: Treating RN: Jan 21, 1946 (77 y.o. Dianna Limbo Primary Care Zakaree Mcclenahan: Oliver Barre Other Clinician: Referring Inocencia Murtaugh: Treating Yaasir Menken/Extender: Duanne Guess PA TEL, PRA YA SHKUMA R Weeks in Treatment: 0 Clinic Level of Care Assessment Items TOOL 1 Quantity Score X- 1 0 Use when EandM and Procedure is performed on INITIAL visit ASSESSMENTS - Nursing Assessment / Reassessment X- 1 20 General Physical Exam (combine w/ comprehensive assessment (listed just below) when performed on new pt. evals) X- 1 25 Comprehensive Assessment (HX, ROS, Risk Assessments, Wounds Hx, etc.) ASSESSMENTS - Wound and Skin Assessment / Reassessment X- 1 10 Dermatologic / Skin Assessment (not related to wound area) ASSESSMENTS - Ostomy and/or Continence Assessment and Care []  - 0 Incontinence Assessment and Management []  - 0 Ostomy Care Assessment and Management (repouching, etc.) PROCESS - Coordination of Care X - Simple Patient / Family Education for ongoing care 1 15 []  - 0 Complex (extensive) Patient / Family Education for ongoing care X- 1 10 Staff obtains Chiropractor, Records, T Results / Process Orders est X- 1 10 Staff telephones HHA, Nursing Homes / Clarify orders / etc []  - 0 Routine Transfer to another Facility (non-emergent condition) []  - 0 Routine Hospital Admission (non-emergent condition) X- 1 15 New Admissions / Manufacturing engineer / Ordering NPWT Apligraf, etc. , []  - 0 Emergency Hospital Admission (emergent condition) PROCESS - Special Needs []  - 0 Pediatric / Minor Patient Management []  - 0 Isolation Patient Management []  - 0 Hearing / Language / Visual special needs []  - 0 Assessment of Community assistance (transportation, D/C planning, etc.) []  - 0 Additional assistance / Altered mentation []  - 0 Support Surface(s) Assessment (bed, cushion, seat, etc.) INTERVENTIONS -  Miscellaneous []  - 0 External ear exam []  - 0 Patient Transfer (multiple staff / Nurse, adult / Similar devices) []  - 0 Simple Staple / Suture removal (25 or less) []  - 0 Complex Staple / Suture removal (26 or more) []  - 0 Hypo/Hyperglycemic Management (do not check if billed separately) X- 1 15  5 of 11 1.021 0.071 N/A A (cm) : rea 0.306 0.007 N/A Volume (cm) : 6 Starting Position 1 (o'clock): 2 Ending Position 1 (o'clock): 0.5 Maximum Distance 1 (cm): Yes No N/A Undermining: Grade 2 Grade 1 N/A Classification: Medium Medium N/A Exudate A mount: Serosanguineous Serosanguineous N/A Exudate Type: red, brown red, brown N/A Exudate Color: Thickened Flat and Intact N/A Wound Margin: Medium (34-66%) Large (67-100%) N/A Granulation A mount: Red Red N/A Granulation Quality: Medium (34-66%) Small (1-33%) N/A Necrotic A mount: Eschar, Adherent Slough Eschar, Adherent Slough N/A Necrotic Tissue: Fascia: No Fascia: No N/A Exposed Structures: Fat Layer (Subcutaneous Tissue): No Fat Layer (Subcutaneous Tissue): No Tendon: No Tendon: No Muscle: No Muscle: No Joint:  No Joint: No Bone: No Bone: No Small (1-33%) Small (1-33%) N/A Epithelialization: Debridement - Selective/Open Wound N/A N/A Debridement: Pre-procedure Verification/Time Out 10:05 N/A N/A Taken: Lidocaine 4% T opical Solution N/A N/A Pain Control: Callus, Slough N/A N/A Tissue Debrided: Skin/Epidermis N/A N/A Level: 1.02 N/A N/A Debridement A (sq cm): rea Curette N/A N/A Instrument: Minimum N/A N/A Bleeding: Pressure N/A N/A Hemostasis A chieved: 0 N/A N/A Procedural Pain: 0 N/A N/A Post Procedural Pain: Procedure was tolerated well N/A N/A Debridement Treatment Response: 1.3x1x0.3 N/A N/A Post Debridement Measurements L x W x D (cm) 0.306 N/A N/A Post Debridement Volume: (cm) Callus: Yes No Abnormalities Noted N/A Periwound Skin Texture: No Abnormalities Noted No Abnormalities Noted N/A Periwound Skin Moisture: No Abnormalities Noted No Abnormalities Noted N/A Periwound Skin Color: No Abnormality No Abnormality N/A Temperature: Debridement N/A N/A Procedures Performed: Treatment Notes Wound #1 (Foot) Wound Laterality: Plantar, Right, Medial Cleanser Soap and Water Discharge Instruction: May shower and wash wound with dial antibacterial soap and water prior to dressing change. Vashe 5.8 (oz) Discharge Instruction: Or Cleanse the wound with Vashe prior to applying a clean dressing using gauze sponges, not tissue or cotton balls. Peri-Wound Care Topical Primary Dressing Maxorb Extra Ag+ Alginate Dressing, 2x2 (in/in) Discharge Instruction: Apply to wound bed as instructed Secondary Dressing Optifoam Non-Adhesive Dressing, 4x4 in Discharge Instruction: Apply over primary dressing -cut into a "donut" Woven Gauze Sponges 2x2 in Discharge Instruction: Apply over primary dressing as directed. Secured With [employer], 4.5x3.1 (in/yd) Discharge Instruction: Secure with Kerlix as directed. Paper Tape, 2x10 (in/yd) Discharge Instruction: Secure  dressing with tape as directed. Compression Wrap Kerlix Roll 4.5x3.1 (in/yd) Discharge Instruction: Apply Kerlix and Coban compression as directed. Compression Stockings [name], [name] ([id number]) [id number] Page 6 of 11 Add-Ons Wound #2 (Foot) Wound Laterality: Dorsal, Left, Medial Cleanser Soap and Water Discharge Instruction: May shower and wash wound with dial antibacterial soap and water prior to dressing change. Vashe 5.8 (oz) Discharge Instruction: Or Cleanse the wound with Vashe prior to applying a clean dressing using gauze sponges, not tissue or cotton balls. [employer] Ancillary Kit - 15 Day Supply Discharge Instruction: Use supplies as instructed; Kit contains: (15) Saline Bullets; (15) 3x3 Gauze; 15 pr Gloves Peri-Wound Care Topical Primary Dressing Maxorb Extra Ag+ Alginate Dressing, 2x2 (in/in) Discharge Instruction: Apply to wound bed as instructed Secondary Dressing Bordered Gauze, 2x2 in Discharge Instruction: Apply over primary dressing as directed. Secured With Compression Wrap Compression Stockings Facilities manager) Signed: 12/16/2022 12:16:51 PM By: Duanne Guess MD FACS Entered By: Duanne Guess on 12/16/2022 09:16:51 -------------------------------------------------------------------------------- Multi-Disciplinary Care Plan Details Patient Name: Date of Service: [name] Aase MES L. 12/16/2022 9:15 A M Medical Record Number: [id number] Patient Account Number: [id number] Date of Birth/Sex: Treating RN: 03/10/1945 (77

## 2022-12-19 DIAGNOSIS — I5042 Chronic combined systolic (congestive) and diastolic (congestive) heart failure: Secondary | ICD-10-CM | POA: Diagnosis not present

## 2022-12-19 DIAGNOSIS — S91302A Unspecified open wound, left foot, initial encounter: Secondary | ICD-10-CM | POA: Diagnosis not present

## 2022-12-19 DIAGNOSIS — S91301A Unspecified open wound, right foot, initial encounter: Secondary | ICD-10-CM | POA: Diagnosis not present

## 2022-12-29 ENCOUNTER — Encounter (HOSPITAL_BASED_OUTPATIENT_CLINIC_OR_DEPARTMENT_OTHER): Payer: Medicare Other | Admitting: General Surgery

## 2022-12-29 ENCOUNTER — Encounter: Payer: Self-pay | Admitting: Internal Medicine

## 2022-12-29 DIAGNOSIS — L97512 Non-pressure chronic ulcer of other part of right foot with fat layer exposed: Secondary | ICD-10-CM | POA: Diagnosis not present

## 2022-12-29 DIAGNOSIS — I255 Ischemic cardiomyopathy: Secondary | ICD-10-CM | POA: Diagnosis not present

## 2022-12-29 DIAGNOSIS — L84 Corns and callosities: Secondary | ICD-10-CM | POA: Diagnosis not present

## 2022-12-29 DIAGNOSIS — I4891 Unspecified atrial fibrillation: Secondary | ICD-10-CM | POA: Diagnosis not present

## 2022-12-29 DIAGNOSIS — E11621 Type 2 diabetes mellitus with foot ulcer: Secondary | ICD-10-CM | POA: Diagnosis not present

## 2022-12-29 DIAGNOSIS — Z95810 Presence of automatic (implantable) cardiac defibrillator: Secondary | ICD-10-CM | POA: Diagnosis not present

## 2022-12-29 DIAGNOSIS — E1151 Type 2 diabetes mellitus with diabetic peripheral angiopathy without gangrene: Secondary | ICD-10-CM | POA: Diagnosis not present

## 2022-12-29 DIAGNOSIS — I2510 Atherosclerotic heart disease of native coronary artery without angina pectoris: Secondary | ICD-10-CM | POA: Diagnosis not present

## 2022-12-29 DIAGNOSIS — I110 Hypertensive heart disease with heart failure: Secondary | ICD-10-CM | POA: Diagnosis not present

## 2022-12-29 DIAGNOSIS — E1142 Type 2 diabetes mellitus with diabetic polyneuropathy: Secondary | ICD-10-CM | POA: Diagnosis not present

## 2022-12-29 DIAGNOSIS — E1149 Type 2 diabetes mellitus with other diabetic neurological complication: Secondary | ICD-10-CM | POA: Diagnosis not present

## 2022-12-29 DIAGNOSIS — I5022 Chronic systolic (congestive) heart failure: Secondary | ICD-10-CM | POA: Diagnosis not present

## 2022-12-29 NOTE — Progress Notes (Signed)
Provider: Treating Provider/Extender: [name] in Treatment: 1 Constitutional Hypotensive, but normal for this patient. . . . no acute distress. Respiratory Normal work of breathing on room air.. Notes 12/29/2022: The left foot wound is healed. The right first metatarsal head wound is surrounded with macerated callus and skin. There is some slough on the surface. Electronic Signature(s) Signed: 12/29/2022 10:59:57 AM By: [name] Guess MD FACS [name] ([id number]) [id number] Page 3 of 9 Entered By: [name] on 12/29/2022 07:59:57 -------------------------------------------------------------------------------- Physician Orders Details Patient Name: Date of Service: [name], [name]. 12/29/2022 9:30 A M Medical Record Number: [id number] Patient Account Number: [id number] Date of Birth/Sex: Treating RN: [birth year] (77 y.o. [name] Primary Care Provider: Oliver [name] Other Clinician: Referring Provider: Treating Provider/Extender: [name] in Treatment: 1 Verbal / Phone Orders: No Diagnosis Coding Follow-up Appointments ppointment in 2 weeks. - Dr. Lady [name] Room 3 01/12/23 @ 11:30 Return A Return appointment in 1 month. - Please ask front desk for an appointment Other: - pick up Desitin or  tube of zinc oxide cream Bathing/ Shower/ Hygiene May shower and wash wound with soap and water. - After showering/Bathing, if you choose, you may perform the wound dressing changes Off-Loading Wound #1 Right,Medial,Plantar Foot Wedge shoe to: - right foot - wear when walking, try to keep pressure off wound area at all times Wound Treatment Wound #1 - Foot Wound Laterality: Plantar, Right, Medial Cleanser: Soap and Water 1 x Per Day/15 Days Discharge Instructions: May shower and wash wound with dial antibacterial soap and water prior to dressing change. Cleanser: Vashe 5.8 (oz) 1 x Per Day/15 Days Discharge Instructions: Or Cleanse the wound with Vashe prior to applying a clean dressing using gauze sponges, not tissue or cotton balls. Peri-Wound Care: Zinc Oxide Ointment 30g tube 1 x Per Day/15 Days Discharge Instructions: Apply Zinc Oxide to periwound with each dressing change Prim Dressing: Maxorb Extra Ag+ Alginate Dressing, 2x2 (in/in) (Generic) 1 x Per Day/15 Days ary Discharge Instructions: Apply to wound bed as instructed Secondary Dressing: Optifoam Non-Adhesive Dressing, 4x4 in (Generic) 1 x Per Day/15 Days Discharge Instructions: Apply over primary dressing -cut into a "donut" Secondary Dressing: Woven Gauze Sponges 2x2 in (Generic) 1 x Per Day/15 Days Discharge Instructions: Apply over primary dressing as directed. Secured With: [employer], 4.5x3.1 (in/yd) (Generic) 1 x Per Day/15 Days Discharge Instructions: Secure with Kerlix as directed. Secured With: Paper Tape, 2x10 (in/yd) (Generic) 1 x Per Day/15 Days Discharge Instructions: Secure dressing with tape as directed. Compression Wrap: Kerlix Roll 4.5x3.1 (in/yd) 1 x Per Day/15 Days Discharge Instructions: Apply Kerlix and Coban compression as directed. Wound #2 - Foot Wound Laterality: Dorsal, Left, Medial Cleanser: Soap and Water 1 x Per Day/15 Days Discharge Instructions: May shower and wash wound with dial  antibacterial soap and water prior to dressing change. Cleanser: Vashe 5.8 (oz) 1 x Per Day/15 Days Discharge Instructions: Or Cleanse the wound with Vashe prior to applying a clean dressing using gauze sponges, not tissue or cotton balls. Cleanser: Byram Ancillary Kit - 15 Day Supply 1 x Per Day/15 Days Discharge Instructions: Use supplies as instructed; Kit contains: (15) Saline Bullets; (15) 3x3 Gauze; 15 pr Gloves Prim Dressing: Maxorb Extra Ag+ Alginate Dressing, 2x2 (in/in) 1 x Per Day/15 Days ary Discharge Instructions: Apply to wound bed as instructed [name], [name] ([id number]) [id number].pdf Page 4 of 9 Secondary Dressing: Bordered Gauze, 2x2 in (Generic) 1 x Per Day/15 Days  [name], [name] ([id number]) [id number] Page 1 of 9 Visit Report for 12/29/2022 Chief Complaint Document Details Patient Name: Date of Service: [name], [name] MES [name]. 12/29/2022 9:30 A M Medical Record Number: [id number] Patient Account Number: [id number] Date of Birth/Sex: Treating RN: 21-Sep-1945 (77 y.o. M) Primary Care Provider: Oliver [name] Other Clinician: Referring Provider: Treating Provider/Extender: [name] in Treatment: 1 Information Obtained from: Patient Chief Complaint Patients presents for treatment of an open diabetic ulcer Electronic Signature(s) Signed: 12/29/2022 10:57:55 AM By: [name] Guess MD FACS Entered By: [name] on 12/29/2022 07:57:55 -------------------------------------------------------------------------------- Debridement Details Patient Name: Date of Service: [name]. 12/29/2022 9:30 A M Medical Record Number: [id number] Patient Account Number: [id number] Date of Birth/Sex: Treating RN: Mar 06, 1945 (77 y.o. M) Primary Care Provider: Oliver [name] Other Clinician: Referring Provider: Treating Provider/Extender: [name] in Treatment: 1 Debridement Performed for Assessment: Wound #1 Right,Medial,Plantar Foot Performed By: Physician [name] Guess, MD The following information was scribed by: Redmond Pulling The information was scribed for: [name] Debridement Type: Debridement Severity of Tissue Pre Debridement: Fat layer exposed Level of Consciousness (Pre-procedure): Awake and Alert Pre-procedure Verification/Time Out Yes - 10:18 Taken: Start Time: 10:18 Pain Control: Lidocaine 5% topical ointment Percent of Wound Bed Debrided: 100% T Area Debrided (cm): otal 0.78 Tissue and other material debrided: Non-Viable, Callus, Slough, Subcutaneous, Skin: Dermis , Skin: Epidermis, Slough Level: Skin/Subcutaneous Tissue Debridement Description:  Excisional Instrument: Curette Bleeding: Minimum Hemostasis Achieved: Pressure Response to Treatment: Procedure was tolerated well Level of Consciousness (Post- Awake and Alert procedure): Post Debridement Measurements of Total Wound Length: (cm) 1 Width: (cm) 1 Depth: (cm) 0.3 Volume: (cm) 0.236 Character of Wound/Ulcer Post Debridement: Improved Severity of Tissue Post Debridement: Fat layer exposed [name], [name] ([id number]) [id number] Page 2 of 9 Post Procedure Diagnosis Same as Pre-procedure Electronic Signature(s) Signed: 12/29/2022 10:57:49 AM By: [name] Guess MD FACS Entered By: [name] on 12/29/2022 07:57:49 -------------------------------------------------------------------------------- HPI Details Patient Name: Date of Service: [name]. 12/29/2022 9:30 A M Medical Record Number: [id number] Patient Account Number: [id number] Date of Birth/Sex: Treating RN: 10-10-1945 (77 y.o. M) Primary Care Provider: Oliver [name] Other Clinician: Referring Provider: Treating Provider/Extender: [name] in Treatment: 1 History of Present Illness HPI Description: ADMISSION 12/16/2022 ***ABIs [name]: 0.95; R: 0.88*** This is a 77 year old type II diabetic (last hemoglobin A1c 7.2% on November 20, 2022) with a fairly extensive cardiac and peripheral vascular history. 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 Most recently, he has been followed by a podiatrist in [place] for right and left diabetic foot ulcers on the first metatarsal head. He says they were at first applying some kind of cream, the name of which he does not remember and have now been applying what he describes as a "some white stuff." Due to failure of the wounds to heal, he was referred to the wound care center for further evaluation and management. 12/29/2022: The left foot wound is healed. The right first  metatarsal head wound is surrounded with macerated callus and skin. There is some slough on the surface. Electronic Signature(s) Signed: 12/29/2022 10:59:19 AM By: [name] Guess MD FACS Entered By: [name] on 12/29/2022 07:59:19 -------------------------------------------------------------------------------- Physical Exam Details Patient Name: Date of Service: [name]. 12/29/2022 9:30 A M Medical Record Number: [id number] Patient Account Number: [id number] Date of Birth/Sex: Treating RN: 10/29/1945 (77 y.o. M) Primary Care Provider: Oliver [name] Other Clinician: Referring  a skin substitute and we will keep that option on the table. Follow-up in 2 weeks, secondary to clinic unvailability. Electronic Signature(s) Signed: 12/29/2022 11:03:30 AM By: [name] Guess MD FACS Entered By: [name] on 12/29/2022 08:03:30 -------------------------------------------------------------------------------- HxROS Details Patient Name: Date of Service: [name]. 12/29/2022 9:30 A M Medical Record Number: [id number] Patient Account Number: [id number] Date of Birth/Sex: Treating RN: 06-20-1945 (77 y.o. 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; Coronary Artery Disease; Hypertension Past Medical History Notes: 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 Hyperlipidemia [name], [name] ([id number]) [phone].pdf Page 8 of 9 Implantable Cardioverter-Defibrillator (ICD) in situ Endocrine Medical History: Positive for: Type II Diabetes Past Medical History Notes: Last Hem A1C 7.2% on 11/21/22 Hypogonadism, Male Time with diabetes: 6 years Treated with: Insulin Genitourinary Medical History: Past Medical History Notes: Nephrolithiasis Integumentary (Skin) Medical History: Past Medical History Notes: Neoplasm of uncertain behavior of skin Callus Right foot Skin Lesion Neurologic Medical History: Positive for: Neuropathy - Diabetic Peripheral Neuropathy Psychiatric Medical History: Past Medical History Notes: General anxiety HBO Extended History Items Eyes: Cataracts Immunizations Pneumococcal Vaccine: Received Pneumococcal Vaccination: Yes Received Pneumococcal Vaccination On or After 60th Birthday: Yes Implantable Devices Yes Hospitalization / Surgery History Type of Hospitalization/Surgery Cataract Extraction Implanted Cardiac Defibrillator Family and Social History Unknown History: Yes; Never smoker; Marital Status - Divorced; Alcohol Use: Never; Drug Use: No History; Caffeine Use: Never; Financial Concerns: No; Food, Clothing or Shelter Needs: No; Support System Lacking: No; Transportation Concerns: No Electronic Signature(s) Signed: 12/29/2022 3:34:18 PM By: [name] Guess MD FACS Entered By: [name] on 12/29/2022  07:59:26 -------------------------------------------------------------------------------- SuperBill Details Patient Name: Date of Service: [name]. 12/29/2022 Medical Record Number: [id number] Patient Account Number: [id number] Date of Birth/Sex: Treating RN: 16-Nov-1945 (77 y.o. 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.621 Type 2 diabetes mellitus with foot ulcer I73.9 Peripheral vascular disease, unspecified E11.49 Type 2 diabetes mellitus with other diabetic neurological complication L84 Corns and callosities Facility Procedures : CPT4 Code: [id number] Description: 11042 - DEB SUBQ TISSUE 20 SQ CM/< ICD-10 Diagnosis Description L97.512 Non-pressure chronic ulcer of other part of right foot with fat layer exposed Modifier: Quantity: 1 Physician Procedures : CPT4 Code Description Modifier [id number] - WC PHYS LEVEL 3 - EST PT ICD-10 Diagnosis Description L97.512 Non-pressure chronic ulcer of other part of right foot with fat layer exposed E11.621 Type 2 diabetes mellitus with foot ulcer I73.9 Peripheral  vascular disease, unspecified E11.49 Type 2 diabetes mellitus with other diabetic neurological complication Quantity: 1 : [id number] 11042 - WC PHYS SUBQ TISS 20 SQ CM ICD-10 Diagnosis Description L97.512 Non-pressure chronic ulcer of other part of right foot with fat layer exposed Quantity: 1 Electronic Signature(s) Signed: 12/29/2022 11:03:47 AM By: [name] Guess MD FACS Entered By: [name] on 12/29/2022 08:03:47  Provider: Treating Provider/Extender: [name] in Treatment: 1 Constitutional Hypotensive, but normal for this patient. . . . no acute distress. Respiratory Normal work of breathing on room air.. Notes 12/29/2022: The left foot wound is healed. The right first metatarsal head wound is surrounded with macerated callus and skin. There is some slough on the surface. Electronic Signature(s) Signed: 12/29/2022 10:59:57 AM By: [name] Guess MD FACS [name] ([id number]) [id number] Page 3 of 9 Entered By: [name] on 12/29/2022 07:59:57 -------------------------------------------------------------------------------- Physician Orders Details Patient Name: Date of Service: [name], [name]. 12/29/2022 9:30 A M Medical Record Number: [id number] Patient Account Number: [id number] Date of Birth/Sex: Treating RN: [birth year] (77 y.o. [name] Primary Care Provider: Oliver [name] Other Clinician: Referring Provider: Treating Provider/Extender: [name] in Treatment: 1 Verbal / Phone Orders: No Diagnosis Coding Follow-up Appointments ppointment in 2 weeks. - Dr. Lady [name] Room 3 01/12/23 @ 11:30 Return A Return appointment in 1 month. - Please ask front desk for an appointment Other: - pick up Desitin or  tube of zinc oxide cream Bathing/ Shower/ Hygiene May shower and wash wound with soap and water. - After showering/Bathing, if you choose, you may perform the wound dressing changes Off-Loading Wound #1 Right,Medial,Plantar Foot Wedge shoe to: - right foot - wear when walking, try to keep pressure off wound area at all times Wound Treatment Wound #1 - Foot Wound Laterality: Plantar, Right, Medial Cleanser: Soap and Water 1 x Per Day/15 Days Discharge Instructions: May shower and wash wound with dial antibacterial soap and water prior to dressing change. Cleanser: Vashe 5.8 (oz) 1 x Per Day/15 Days Discharge Instructions: Or Cleanse the wound with Vashe prior to applying a clean dressing using gauze sponges, not tissue or cotton balls. Peri-Wound Care: Zinc Oxide Ointment 30g tube 1 x Per Day/15 Days Discharge Instructions: Apply Zinc Oxide to periwound with each dressing change Prim Dressing: Maxorb Extra Ag+ Alginate Dressing, 2x2 (in/in) (Generic) 1 x Per Day/15 Days ary Discharge Instructions: Apply to wound bed as instructed Secondary Dressing: Optifoam Non-Adhesive Dressing, 4x4 in (Generic) 1 x Per Day/15 Days Discharge Instructions: Apply over primary dressing -cut into a "donut" Secondary Dressing: Woven Gauze Sponges 2x2 in (Generic) 1 x Per Day/15 Days Discharge Instructions: Apply over primary dressing as directed. Secured With: [employer], 4.5x3.1 (in/yd) (Generic) 1 x Per Day/15 Days Discharge Instructions: Secure with Kerlix as directed. Secured With: Paper Tape, 2x10 (in/yd) (Generic) 1 x Per Day/15 Days Discharge Instructions: Secure dressing with tape as directed. Compression Wrap: Kerlix Roll 4.5x3.1 (in/yd) 1 x Per Day/15 Days Discharge Instructions: Apply Kerlix and Coban compression as directed. Wound #2 - Foot Wound Laterality: Dorsal, Left, Medial Cleanser: Soap and Water 1 x Per Day/15 Days Discharge Instructions: May shower and wash wound with dial  antibacterial soap and water prior to dressing change. Cleanser: Vashe 5.8 (oz) 1 x Per Day/15 Days Discharge Instructions: Or Cleanse the wound with Vashe prior to applying a clean dressing using gauze sponges, not tissue or cotton balls. Cleanser: Byram Ancillary Kit - 15 Day Supply 1 x Per Day/15 Days Discharge Instructions: Use supplies as instructed; Kit contains: (15) Saline Bullets; (15) 3x3 Gauze; 15 pr Gloves Prim Dressing: Maxorb Extra Ag+ Alginate Dressing, 2x2 (in/in) 1 x Per Day/15 Days ary Discharge Instructions: Apply to wound bed as instructed [name], [name] ([id number]) [id number].pdf Page 4 of 9 Secondary Dressing: Bordered Gauze, 2x2 in (Generic) 1 x Per Day/15 Days  [name], [name] ([id number]) [id number] Page 1 of 9 Visit Report for 12/29/2022 Chief Complaint Document Details Patient Name: Date of Service: [name], [name] MES [name]. 12/29/2022 9:30 A M Medical Record Number: [id number] Patient Account Number: [id number] Date of Birth/Sex: Treating RN: 21-Sep-1945 (77 y.o. M) Primary Care Provider: Oliver [name] Other Clinician: Referring Provider: Treating Provider/Extender: [name] in Treatment: 1 Information Obtained from: Patient Chief Complaint Patients presents for treatment of an open diabetic ulcer Electronic Signature(s) Signed: 12/29/2022 10:57:55 AM By: [name] Guess MD FACS Entered By: [name] on 12/29/2022 07:57:55 -------------------------------------------------------------------------------- Debridement Details Patient Name: Date of Service: [name]. 12/29/2022 9:30 A M Medical Record Number: [id number] Patient Account Number: [id number] Date of Birth/Sex: Treating RN: Mar 06, 1945 (77 y.o. M) Primary Care Provider: Oliver [name] Other Clinician: Referring Provider: Treating Provider/Extender: [name] in Treatment: 1 Debridement Performed for Assessment: Wound #1 Right,Medial,Plantar Foot Performed By: Physician [name] Guess, MD The following information was scribed by: Redmond Pulling The information was scribed for: [name] Debridement Type: Debridement Severity of Tissue Pre Debridement: Fat layer exposed Level of Consciousness (Pre-procedure): Awake and Alert Pre-procedure Verification/Time Out Yes - 10:18 Taken: Start Time: 10:18 Pain Control: Lidocaine 5% topical ointment Percent of Wound Bed Debrided: 100% T Area Debrided (cm): otal 0.78 Tissue and other material debrided: Non-Viable, Callus, Slough, Subcutaneous, Skin: Dermis , Skin: Epidermis, Slough Level: Skin/Subcutaneous Tissue Debridement Description:  Excisional Instrument: Curette Bleeding: Minimum Hemostasis Achieved: Pressure Response to Treatment: Procedure was tolerated well Level of Consciousness (Post- Awake and Alert procedure): Post Debridement Measurements of Total Wound Length: (cm) 1 Width: (cm) 1 Depth: (cm) 0.3 Volume: (cm) 0.236 Character of Wound/Ulcer Post Debridement: Improved Severity of Tissue Post Debridement: Fat layer exposed [name], [name] ([id number]) [id number] Page 2 of 9 Post Procedure Diagnosis Same as Pre-procedure Electronic Signature(s) Signed: 12/29/2022 10:57:49 AM By: [name] Guess MD FACS Entered By: [name] on 12/29/2022 07:57:49 -------------------------------------------------------------------------------- HPI Details Patient Name: Date of Service: [name]. 12/29/2022 9:30 A M Medical Record Number: [id number] Patient Account Number: [id number] Date of Birth/Sex: Treating RN: 10-10-1945 (77 y.o. M) Primary Care Provider: Oliver [name] Other Clinician: Referring Provider: Treating Provider/Extender: [name] in Treatment: 1 History of Present Illness HPI Description: ADMISSION 12/16/2022 ***ABIs [name]: 0.95; R: 0.88*** This is a 77 year old type II diabetic (last hemoglobin A1c 7.2% on November 20, 2022) with a fairly extensive cardiac and peripheral vascular history. 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 Most recently, he has been followed by a podiatrist in [place] for right and left diabetic foot ulcers on the first metatarsal head. He says they were at first applying some kind of cream, the name of which he does not remember and have now been applying what he describes as a "some white stuff." Due to failure of the wounds to heal, he was referred to the wound care center for further evaluation and management. 12/29/2022: The left foot wound is healed. The right first  metatarsal head wound is surrounded with macerated callus and skin. There is some slough on the surface. Electronic Signature(s) Signed: 12/29/2022 10:59:19 AM By: [name] Guess MD FACS Entered By: [name] on 12/29/2022 07:59:19 -------------------------------------------------------------------------------- Physical Exam Details Patient Name: Date of Service: [name]. 12/29/2022 9:30 A M Medical Record Number: [id number] Patient Account Number: [id number] Date of Birth/Sex: Treating RN: 10/29/1945 (77 y.o. M) Primary Care Provider: Oliver [name] Other Clinician: Referring  a skin substitute and we will keep that option on the table. Follow-up in 2 weeks, secondary to clinic unvailability. Electronic Signature(s) Signed: 12/29/2022 11:03:30 AM By: [name] Guess MD FACS Entered By: [name] on 12/29/2022 08:03:30 -------------------------------------------------------------------------------- HxROS Details Patient Name: Date of Service: [name]. 12/29/2022 9:30 A M Medical Record Number: [id number] Patient Account Number: [id number] Date of Birth/Sex: Treating RN: 06-20-1945 (77 y.o. 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; Coronary Artery Disease; Hypertension Past Medical History Notes: 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 Hyperlipidemia [name], [name] ([id number]) [phone].pdf Page 8 of 9 Implantable Cardioverter-Defibrillator (ICD) in situ Endocrine Medical History: Positive for: Type II Diabetes Past Medical History Notes: Last Hem A1C 7.2% on 11/21/22 Hypogonadism, Male Time with diabetes: 6 years Treated with: Insulin Genitourinary Medical History: Past Medical History Notes: Nephrolithiasis Integumentary (Skin) Medical History: Past Medical History Notes: Neoplasm of uncertain behavior of skin Callus Right foot Skin Lesion Neurologic Medical History: Positive for: Neuropathy - Diabetic Peripheral Neuropathy Psychiatric Medical History: Past Medical History Notes: General anxiety HBO Extended History Items Eyes: Cataracts Immunizations Pneumococcal Vaccine: Received Pneumococcal Vaccination: Yes Received Pneumococcal Vaccination On or After 60th Birthday: Yes Implantable Devices Yes Hospitalization / Surgery History Type of Hospitalization/Surgery Cataract Extraction Implanted Cardiac Defibrillator Family and Social History Unknown History: Yes; Never smoker; Marital Status - Divorced; Alcohol Use: Never; Drug Use: No History; Caffeine Use: Never; Financial Concerns: No; Food, Clothing or Shelter Needs: No; Support System Lacking: No; Transportation Concerns: No Electronic Signature(s) Signed: 12/29/2022 3:34:18 PM By: [name] Guess MD FACS Entered By: [name] on 12/29/2022  07:59:26 -------------------------------------------------------------------------------- SuperBill Details Patient Name: Date of Service: [name]. 12/29/2022 Medical Record Number: [id number] Patient Account Number: [id number] Date of Birth/Sex: Treating RN: 16-Nov-1945 (77 y.o. 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.621 Type 2 diabetes mellitus with foot ulcer I73.9 Peripheral vascular disease, unspecified E11.49 Type 2 diabetes mellitus with other diabetic neurological complication L84 Corns and callosities Facility Procedures : CPT4 Code: [id number] Description: 11042 - DEB SUBQ TISSUE 20 SQ CM/< ICD-10 Diagnosis Description L97.512 Non-pressure chronic ulcer of other part of right foot with fat layer exposed Modifier: Quantity: 1 Physician Procedures : CPT4 Code Description Modifier [id number] - WC PHYS LEVEL 3 - EST PT ICD-10 Diagnosis Description L97.512 Non-pressure chronic ulcer of other part of right foot with fat layer exposed E11.621 Type 2 diabetes mellitus with foot ulcer I73.9 Peripheral  vascular disease, unspecified E11.49 Type 2 diabetes mellitus with other diabetic neurological complication Quantity: 1 : [id number] 11042 - WC PHYS SUBQ TISS 20 SQ CM ICD-10 Diagnosis Description L97.512 Non-pressure chronic ulcer of other part of right foot with fat layer exposed Quantity: 1 Electronic Signature(s) Signed: 12/29/2022 11:03:47 AM By: [name] Guess MD FACS Entered By: [name] on 12/29/2022 08:03:47

## 2022-12-30 NOTE — Progress Notes (Signed)
BODHI, MORADI ([id number]) [id number] Page 1 of 9 Visit Report for 12/29/2022 Arrival Information Details Patient Name: Date of Service: [name], [name] MES [name]. 12/29/2022 9:30 A M Medical Record Number: [id number] Patient Account Number: [id number] Date of Birth/Sex: Treating RN: 30-Aug-1945 (77 y.o. [name] Primary Care Renne Platts: Oliver Barre Other Clinician: Referring Nickolette Espinola: Treating Isabella Roemmich/Extender: Joeseph Amor in Treatment: 1 Visit Information History Since Last Visit Added or deleted any medications: No Patient Arrived: Ambulatory Any new allergies or adverse reactions: No Arrival Time: 10:04 Had a fall or experienced change in No Accompanied By: self activities of daily living that may affect Transfer Assistance: None risk of falls: Patient Identification Verified: Yes Signs or symptoms of abuse/neglect since last visito No Secondary Verification Process Completed: Yes Hospitalized since last visit: No Patient Has Alerts: Yes Implantable device outside of the clinic excluding No Patient Alerts: Patient on Blood Thinner cellular tissue based products placed in the center Xarelto since last visit: Has Dressing in Place as Prescribed: Yes Pain Present Now: No Electronic Signature(s) Signed: 12/29/2022 4:34:06 PM By: Redmond Pulling RN, BSN Entered By: Redmond Pulling on 12/29/2022 10:05:43 -------------------------------------------------------------------------------- Encounter Discharge Information Details Patient Name: Date of Service: [name] Aase MES [name]. 12/29/2022 9:30 A M Medical Record Number: [id number] Patient Account Number: [id number] Date of Birth/Sex: Treating RN: [birth year] (77 y.o. [name] Primary Care Jolicia Delira: Oliver Barre Other Clinician: Referring Burhan Barham: Treating Samual Beals/Extender: Joeseph Amor in Treatment: 1 Encounter Discharge Information Items Post Procedure  Vitals Discharge Condition: Stable Temperature (F): 97.7 Ambulatory Status: Ambulatory Pulse (bpm): 80 Discharge Destination: Home Respiratory Rate (breaths/min): 18 Transportation: Private Auto Blood Pressure (mmHg): 94/63 Accompanied By: self Schedule Follow-up Appointment: Yes Clinical Summary of Care: Patient Declined Electronic Signature(s) Signed: 12/29/2022 4:34:06 PM By: Redmond Pulling RN, BSN Entered By: Redmond Pulling on 12/29/2022 12:02:21 [name] ([id number]) [id number] Page 2 of 9 -------------------------------------------------------------------------------- Lower Extremity Assessment Details Patient Name: Date of Service: [name], [name] MES [name]. 12/29/2022 9:30 A M Medical Record Number: [id number] Patient Account Number: [id number] Date of Birth/Sex: Treating RN: 1945-12-01 (77 y.o. [name] Primary Care Josefa [place]: Oliver Barre Other Clinician: Referring Brynlie Daza: Treating Nakaya Mishkin/Extender: Joeseph Amor in Treatment: 1 Edema Assessment Assessed: [Left: No] [Right: No] Edema: [Left: No] [Right: No] Calf Left: Right: Point of Measurement: 29 cm From Medial Instep 36 cm 35.5 cm Ankle Left: Right: Point of Measurement: 10 cm From Medial Instep 22 cm 21.5 cm Vascular Assessment Pulses: Dorsalis Pedis Palpable: [Left:Yes] [Right:Yes] Extremity colors, hair growth, and conditions: Extremity Color: [Left:Normal] [Right:Normal] Hair Growth on Extremity: [Left:Yes] [Right:Yes] Temperature of Extremity: [Left:Warm] [Right:Warm] Capillary Refill: [Left:< 3 seconds] [Right:< 3 seconds] Dependent Rubor: [Left:No No] [Right:No No] Electronic Signature(s) Signed: 12/29/2022 4:34:06 PM By: Redmond Pulling RN, BSN Entered By: Redmond Pulling on 12/29/2022 10:07:28 -------------------------------------------------------------------------------- Multi Wound Chart Details Patient Name: Date of Service: [name] Aase MES  [name]. 12/29/2022 9:30 A M Medical Record Number: [id number] Patient Account Number: [id number] Date of Birth/Sex: Treating RN: 30-Sep-1945 (77 y.o. M) Primary Care Akil Hoos: Oliver Barre Other Clinician: Referring Alys Dulak: Treating Laren Orama/Extender: Joeseph Amor in Treatment: 1 Vital Signs Height(in): 70 Pulse(bpm): 80 Weight(lbs): 186 Blood Pressure(mmHg): 94/63 Body Mass Index(BMI): 26.7 Temperature(F): 97.7 Respiratory Rate(breaths/min): 18 [1:Photos:] [N/A:N/A [id number] Page 3 of 9] Right, Medial, Plantar Foot Left, Medial, Dorsal Foot N/A Wound Location: Gradually Appeared Trauma N/A Wounding Event: Diabetic Wound/Ulcer of the Lower Diabetic Wound/Ulcer of the Lower N/A Primary Etiology: Extremity  Extremity Cataracts, Arrhythmia, Congestive Cataracts, Arrhythmia, Congestive N/A Comorbid History: Heart Failure, Coronary Artery Heart Failure, Coronary Artery Disease, Hypertension, Type II Disease, Hypertension, Type II Diabetes, Neuropathy Diabetes, Neuropathy 05/30/2022 12/15/2022 N/A Date Acquired: 1 1 N/A Weeks of Treatment: Open Open N/A Wound Status: No No N/A Wound Recurrence: 1x0.8x0.3 0.1x0.1x0.1 N/A Measurements [name] (cm) 0.628 0.008 N/A A (cm) : rea 0.188 0.001 N/A Volume (cm) : 38.50% 88.70% N/A % Reduction in A rea: 38.60% 85.70% N/A % Reduction in Volume: 9 Starting Position 1 (o'clock): 12 Ending Position 1 (o'clock): 0.2 Maximum Distance 1 (cm): Yes No N/A Undermining: Grade 2 Grade 1 N/A Classification: Medium Medium N/A Exudate A mount: Serosanguineous Serosanguineous N/A Exudate Type: red, brown red, brown N/A Exudate Color: Thickened Flat and Intact N/A Wound Margin: Medium (34-66%) Large (67-100%) N/A Granulation A mount: Red Red N/A Granulation Quality: Medium (34-66%) Small (1-33%) N/A Necrotic A mount: Eschar, Adherent Slough Eschar, Adherent Slough N/A Necrotic  Tissue: Fascia: No Fascia: No N/A Exposed Structures: Fat Layer (Subcutaneous Tissue): No Fat Layer (Subcutaneous Tissue): No Tendon: No Tendon: No Muscle: No Muscle: No Joint: No Joint: No Bone: No Bone: No Small (1-33%) Small (1-33%) N/A Epithelialization: Debridement - Selective/Open Wound N/A N/A Debridement: Pre-procedure Verification/Time Out 10:18 N/A N/A Taken: Lidocaine 5% topical ointment N/A N/A Pain Control: Callus, Slough N/A N/A Tissue Debrided: Skin/Epidermis N/A N/A Level: 0.78 N/A N/A Debridement A (sq cm): rea Curette N/A N/A Instrument: Minimum N/A N/A Bleeding: Pressure N/A N/A Hemostasis A chieved: Procedure was tolerated well N/A N/A Debridement Treatment Response: 1x1x0.3 N/A N/A Post Debridement Measurements [name] (cm) 0.236 N/A N/A Post Debridement Volume: (cm) Callus: Yes No Abnormalities Noted N/A Periwound Skin Texture: No Abnormalities Noted No Abnormalities Noted N/A Periwound Skin Moisture: No Abnormalities Noted No Abnormalities Noted N/A Periwound Skin Color: No Abnormality No Abnormality N/A Temperature: Debridement N/A N/A Procedures Performed: Treatment Notes Electronic Signature(s) Signed: 12/29/2022 10:57:33 AM By: Duanne Guess MD FACS Entered By: Duanne Guess on 12/29/2022 10:57:33 Multi-Disciplinary Care Plan Details -------------------------------------------------------------------------------- [name] ([id number]) [id number] Page 4 of 9 Patient Name: Date of Service: [name], [name] MES [name]. 12/29/2022 9:30 A M Medical Record Number: [id number] Patient Account Number: [id number] Date of Birth/Sex: Treating RN: 07/30/1945 (77 y.o. [name] Primary Care Ziya Coonrod: Oliver Barre Other Clinician: Referring Zyanna Leisinger: Treating Dezirae Service/Extender: Joeseph Amor in Treatment: 1 Active Inactive Wound/Skin Impairment Nursing Diagnoses: Impaired tissue  integrity Goals: Patient/caregiver will verbalize understanding of skin care regimen Date Initiated: 12/16/2022 Target Resolution Date: 02/28/2023 Goal Status: Active Interventions: Assess ulceration(s) every visit Treatment Activities: Skin care regimen initiated : 12/16/2022 Notes: Electronic Signature(s) Signed: 12/29/2022 4:34:06 PM By: Redmond Pulling RN, BSN Entered By: Redmond Pulling on 12/29/2022 10:17:35 -------------------------------------------------------------------------------- Pain Assessment Details Patient Name: Date of Service: [name] Aase MES [name]. 12/29/2022 9:30 A M Medical Record Number: [id number] Patient Account Number: [id number] Date of Birth/Sex: Treating RN: 07-12-1945 (77 y.o. [name] Primary Care Dorsel Flinn: Oliver Barre Other Clinician: Referring Morine Kohlman: Treating Jeananne Bedwell/Extender: Joeseph Amor in Treatment: 1 Active Problems Location of Pain Severity and Description of Pain Patient Has Paino No Site Locations Pain Management and Medication Current Pain Management: JADON, HARBAUGH ([id number]) [id number].pdf Page 5 of 9 Electronic Signature(s) Signed: 12/29/2022 4:34:06 PM By: Redmond Pulling RN, BSN Entered By: Redmond Pulling on 12/29/2022 10:06:27 -------------------------------------------------------------------------------- Patient/Caregiver Education Details Patient Name: Date of Service: [name] Aase MES [name]. 10/31/2024andnbsp9:30 A M Medical Record  Number: [id number] Patient Account Number: [id number] Date of Birth/Gender: Treating RN: 30-Jul-1945 (77 y.o. [name] Primary Care Physician: Oliver Barre Other Clinician: Referring Physician: Treating Physician/Extender: Joeseph Amor in Treatment: 1 Education Assessment Education Provided To: Patient Education Topics Provided Wound/Skin Impairment: Methods: Explain/Verbal Responses: State content  correctly Electronic Signature(s) Signed: 12/29/2022 4:34:06 PM By: Redmond Pulling RN, BSN Entered By: Redmond Pulling on 12/29/2022 10:17:49 -------------------------------------------------------------------------------- Wound Assessment Details Patient Name: Date of Service: [name] Aase MES [name]. 12/29/2022 9:30 A M Medical Record Number: [id number] Patient Account Number: [id number] Date of Birth/Sex: Treating RN: 05/05/1945 (77 y.o. [name] Primary Care Kambra Beachem: Oliver Barre Other Clinician: Referring Khrystyne Arpin: Treating Ziair Penson/Extender: Joeseph Amor in Treatment: 1 Wound Status Wound Number: 1 Primary Diabetic Wound/Ulcer of the Lower Extremity Etiology: Wound Location: Right, Medial, Plantar Foot Wound Open Wounding Event: Gradually Appeared Status: Date Acquired: 05/30/2022 Comorbid Cataracts, Arrhythmia, Congestive Heart Failure, Coronary Artery Weeks Of Treatment: 1 History: Disease, Hypertension, Type II Diabetes, Neuropathy Clustered Wound: No Photos MONTERRIUS, CARDOSA ([id number]) [id number] Page 6 of 9 Wound Measurements Length: (cm) 1 Width: (cm) 0.8 Depth: (cm) 0.3 Area: (cm) 0.628 Volume: (cm) 0.188 % Reduction in Area: 38.5% % Reduction in Volume: 38.6% Epithelialization: Small (1-33%) Tunneling: No Undermining: Yes Starting Position (o'clock): 9 Ending Position (o'clock): 12 Maximum Distance: (cm) 0.2 Wound Description Classification: Grade 2 Wound Margin: Thickened Exudate Amount: Medium Exudate Type: Serosanguineous Exudate Color: red, brown Foul Odor After Cleansing: No Slough/Fibrino Yes Wound Bed Granulation Amount: Medium (34-66%) Exposed Structure Granulation Quality: Red Fascia Exposed: No Necrotic Amount: Medium (34-66%) Fat Layer (Subcutaneous Tissue) Exposed: No Necrotic Quality: Eschar, Adherent Slough Tendon Exposed: No Muscle Exposed: No Joint Exposed: No Bone Exposed:  No Periwound Skin Texture Texture Color No Abnormalities Noted: No No Abnormalities Noted: Yes Callus: Yes Temperature / Pain Temperature: No Abnormality Moisture No Abnormalities Noted: Yes Treatment Notes Wound #1 (Foot) Wound Laterality: Plantar, Right, Medial Cleanser Soap and Water Discharge Instruction: May shower and wash wound with dial antibacterial soap and water prior to dressing change. Vashe 5.8 (oz) Discharge Instruction: Or Cleanse the wound with Vashe prior to applying a clean dressing using gauze sponges, not tissue or cotton balls. Peri-Wound Care Zinc Oxide Ointment 30g tube Discharge Instruction: Apply Zinc Oxide to periwound with each dressing change Topical Primary Dressing Maxorb Extra Ag+ Alginate Dressing, 2x2 (in/in) Discharge Instruction: Apply to wound bed as instructed Secondary Dressing Optifoam Non-Adhesive Dressing, 4x4 in Discharge Instruction: Apply over primary dressing -cut into a "donut" Woven Gauze Sponges 2x2 in Discharge Instruction: Apply over primary dressing as directed. MASIN, SHATTO ([id number]) [id number] Page 7 of 9 Secured With [employer], 4.5x3.1 (in/yd) Discharge Instruction: Secure with Kerlix as directed. Paper Tape, 2x10 (in/yd) Discharge Instruction: Secure dressing with tape as directed. Compression Wrap Kerlix Roll 4.5x3.1 (in/yd) Discharge Instruction: Apply Kerlix and Coban compression as directed. Compression Stockings Add-Ons Electronic Signature(s) Signed: 12/29/2022 4:34:06 PM By: Redmond Pulling RN, BSN Entered By: Redmond Pulling on 12/29/2022 10:02:47 -------------------------------------------------------------------------------- Wound Assessment Details Patient Name: Date of Service: [name] Aase MES [name]. 12/29/2022 9:30 A M Medical Record Number: [id number] Patient Account Number: [id number] Date of Birth/Sex: Treating RN: 1945-12-17 (77 y.o. [name] Primary Care  Puja Caffey: Oliver Barre Other Clinician: Referring Kabrea Seeney: Treating Arretta Toenjes/Extender: Joeseph Amor in Treatment: 1 Wound Status Wound Number: 2 Primary Diabetic Wound/Ulcer of the Lower Extremity Etiology: Wound Location: Left, Medial, Dorsal Foot Wound Open Wounding Event: Trauma  Status: Date Acquired: 12/15/2022 Comorbid Cataracts, Arrhythmia, Congestive Heart Failure, Coronary Artery Weeks Of Treatment: 1 History: Disease, Hypertension, Type II Diabetes, Neuropathy Clustered Wound: No Photos Wound Measurements Length: (cm) 0.1 Width: (cm) 0.1 Depth: (cm) 0.1 Area: (cm) 0.008 Volume: (cm) 0.001 % Reduction in Area: 88.7% % Reduction in Volume: 85.7% Epithelialization: Small (1-33%) Tunneling: No Undermining: No Wound Description Classification: Grade 1 Wound Margin: Flat and Intact Exudate Amount: Medium Exudate Type: Serosanguineous Exudate Color: red, brown Foul Odor After Cleansing: No Slough/Fibrino Yes Wound Bed Granulation Amount: Large (67-100%) Exposed Structure [name], [name] ([id number]) [phone].pdf Page 8 of 9 Granulation Quality: Red Fascia Exposed: No Necrotic Amount: Small (1-33%) Fat Layer (Subcutaneous Tissue) Exposed: No Necrotic Quality: Eschar, Adherent Slough Tendon Exposed: No Muscle Exposed: No Joint Exposed: No Bone Exposed: No Periwound Skin Texture Texture Color No Abnormalities Noted: Yes No Abnormalities Noted: Yes Moisture Temperature / Pain No Abnormalities Noted: Yes Temperature: No Abnormality Treatment Notes Wound #2 (Foot) Wound Laterality: Dorsal, Left, Medial Cleanser Soap and Water Discharge Instruction: May shower and wash wound with dial antibacterial soap and water prior to dressing change. Vashe 5.8 (oz) Discharge Instruction: Or Cleanse the wound with Vashe prior to applying a clean dressing using gauze sponges, not tissue or cotton balls. [name]  Day Supply Discharge Instruction: Use supplies as instructed; Kit contains: ([name]) Saline Bullets; ([name]) 3x3 Gauze; [name] pr Gloves Peri-Wound Care Topical Primary Dressing Maxorb Extra Ag+ Alginate Dressing, 2x2 (in/in) Discharge Instruction: Apply to wound bed as instructed Secondary Dressing Bordered Gauze, 2x2 in Discharge Instruction: Apply over primary dressing as directed. Secured With Compression Wrap Compression Stockings Facilities manager) Signed: 12/29/2022 4:34:06 PM By: Redmond Pulling RN, BSN Entered By: Redmond Pulling on 12/29/2022 10:03:26 -------------------------------------------------------------------------------- Vitals Details Patient Name: Date of Service: [name] Aase MES [name]. 12/29/2022 9:30 A M Medical Record Number: [id number] Patient Account Number: [id number] Date of Birth/Sex: Treating RN: 27-Jul-1945 (77 y.o. [name] Primary Care Sammy Douthitt: Oliver Barre Other Clinician: Referring Charlayne Vultaggio: Treating Aleric Froelich/Extender: Joeseph Amor in Treatment: 1 Vital Signs Time Taken: 10:00 Temperature (F): 97.7 Height (in): 70 Pulse (bpm): 80 Weight (lbs): 186 Respiratory Rate (breaths/min): 18 Body Mass Index (BMI): 26.7 Blood Pressure (mmHg): 94/63 Reference Range: 80 - 120 mg / dl [name], [name] ([id number]) [phone].pdf Page 9 of 9 Electronic Signature(s) Signed: 12/29/2022 4:34:06 PM By: Redmond Pulling RN, BSN Entered By: Redmond Pulling on 12/29/2022 10:06:20

## 2023-01-09 DIAGNOSIS — I10 Essential (primary) hypertension: Secondary | ICD-10-CM | POA: Diagnosis not present

## 2023-01-09 DIAGNOSIS — I5020 Unspecified systolic (congestive) heart failure: Secondary | ICD-10-CM | POA: Diagnosis not present

## 2023-01-09 DIAGNOSIS — E1159 Type 2 diabetes mellitus with other circulatory complications: Secondary | ICD-10-CM | POA: Diagnosis not present

## 2023-01-09 DIAGNOSIS — Z794 Long term (current) use of insulin: Secondary | ICD-10-CM | POA: Diagnosis not present

## 2023-01-12 ENCOUNTER — Encounter (HOSPITAL_BASED_OUTPATIENT_CLINIC_OR_DEPARTMENT_OTHER): Payer: Medicare Other | Attending: General Surgery | Admitting: General Surgery

## 2023-01-12 DIAGNOSIS — B958 Unspecified staphylococcus as the cause of diseases classified elsewhere: Secondary | ICD-10-CM | POA: Insufficient documentation

## 2023-01-12 DIAGNOSIS — L97512 Non-pressure chronic ulcer of other part of right foot with fat layer exposed: Secondary | ICD-10-CM | POA: Insufficient documentation

## 2023-01-12 DIAGNOSIS — I110 Hypertensive heart disease with heart failure: Secondary | ICD-10-CM | POA: Diagnosis not present

## 2023-01-12 DIAGNOSIS — E11621 Type 2 diabetes mellitus with foot ulcer: Secondary | ICD-10-CM | POA: Insufficient documentation

## 2023-01-12 DIAGNOSIS — B965 Pseudomonas (aeruginosa) (mallei) (pseudomallei) as the cause of diseases classified elsewhere: Secondary | ICD-10-CM | POA: Diagnosis not present

## 2023-01-12 DIAGNOSIS — I5022 Chronic systolic (congestive) heart failure: Secondary | ICD-10-CM | POA: Diagnosis not present

## 2023-01-12 DIAGNOSIS — B952 Enterococcus as the cause of diseases classified elsewhere: Secondary | ICD-10-CM | POA: Insufficient documentation

## 2023-01-12 DIAGNOSIS — L84 Corns and callosities: Secondary | ICD-10-CM | POA: Diagnosis not present

## 2023-01-12 DIAGNOSIS — Z89431 Acquired absence of right foot: Secondary | ICD-10-CM | POA: Insufficient documentation

## 2023-01-12 DIAGNOSIS — E1151 Type 2 diabetes mellitus with diabetic peripheral angiopathy without gangrene: Secondary | ICD-10-CM | POA: Insufficient documentation

## 2023-01-12 DIAGNOSIS — E1149 Type 2 diabetes mellitus with other diabetic neurological complication: Secondary | ICD-10-CM | POA: Diagnosis not present

## 2023-01-12 DIAGNOSIS — B955 Unspecified streptococcus as the cause of diseases classified elsewhere: Secondary | ICD-10-CM | POA: Diagnosis not present

## 2023-01-12 DIAGNOSIS — E1142 Type 2 diabetes mellitus with diabetic polyneuropathy: Secondary | ICD-10-CM | POA: Insufficient documentation

## 2023-01-12 NOTE — Progress Notes (Signed)
[name], [name] ([id number]) [id number] Page 1 of 8 Visit Report for 01/12/2023 Arrival Information Details Patient Name: Date of Service: [name] MES [name]. 01/12/2023 11:30 A M Medical Record Number: [id number] Patient Account Number: [id number] Date of Birth/Sex: Treating RN: Jan 05, 1946 (77 y.o. [name] Primary Care Esly Selvage: Oliver Barre Other Clinician: Referring Brisia Schuermann: Treating Jeffrey Voth/Extender: Joeseph Amor in Treatment: 3 Visit Information History Since Last Visit Added or deleted any medications: No Patient Arrived: Ambulatory Any new allergies or adverse reactions: No Arrival Time: 11:48 Had a fall or experienced change in No Accompanied By: self activities of daily living that may affect Transfer Assistance: None risk of falls: Patient Identification Verified: Yes Signs or symptoms of abuse/neglect since last visito No Secondary Verification Process Completed: Yes Hospitalized since last visit: No Patient Has Alerts: Yes Implantable device outside of the clinic excluding No Patient Alerts: Patient on Blood Thinner cellular tissue based products placed in the center Xarelto since last visit: Has Dressing in Place as Prescribed: Yes Pain Present Now: No Electronic Signature(s) Signed: 01/12/2023 2:49:10 PM By: Samuella Bruin Entered By: Samuella Bruin on 01/12/2023 08:48:22 -------------------------------------------------------------------------------- Encounter Discharge Information Details Patient Name: Date of Service: [name]. 01/12/2023 11:30 A M Medical Record Number: [id number] Patient Account Number: [id number] Date of Birth/Sex: Treating RN: 1945-07-31 (77 y.o. [name] Primary Care Guillaume Weninger: Oliver Barre Other Clinician: Referring Katlyn Muldrew: Treating Kadyn Guild/Extender: Joeseph Amor in Treatment: 3 Encounter Discharge Information Items Post  Procedure Vitals Discharge Condition: Stable Temperature (F): 97.2 Ambulatory Status: Ambulatory Pulse (bpm): 81 Discharge Destination: Home Respiratory Rate (breaths/min): 18 Transportation: Private Auto Blood Pressure (mmHg): 95/70 Accompanied By: self Schedule Follow-up Appointment: Yes Clinical Summary of Care: Patient Declined Electronic Signature(s) Signed: 01/12/2023 2:49:10 PM By: Samuella Bruin Entered By: Samuella Bruin on 01/12/2023 09:08:03 [name] ([id number]) [id number] Page 2 of 8 -------------------------------------------------------------------------------- Lower Extremity Assessment Details Patient Name: Date of Service: [name], [name] MES [name]. 01/12/2023 11:30 A M Medical Record Number: [id number] Patient Account Number: [id number] Date of Birth/Sex: Treating RN: 1945-10-28 (77 y.o. [name] Primary Care Jazzmine Kleiman: Oliver Barre Other Clinician: Referring Zahirah Cheslock: Treating Yanitza Shvartsman/Extender: Joeseph Amor in Treatment: 3 Edema Assessment Assessed: Kyra Searles: No] [Right: No] Edema: [Left: No] [Right: No] Calf Left: Right: Point of Measurement: 29 cm From Medial Instep 36 cm 35.5 cm Ankle Left: Right: Point of Measurement: 10 cm From Medial Instep 22 cm 21.5 cm Vascular Assessment Extremity colors, hair growth, and conditions: Extremity Color: [Left:Normal] [Right:Normal] Hair Growth on Extremity: [Left:Yes] [Right:Yes] Temperature of Extremity: [Left:Warm] [Right:Warm] Capillary Refill: [Left:< 3 seconds] [Right:< 3 seconds] Dependent Rubor: [Left:No No] [Right:No No] Electronic Signature(s) Signed: 01/12/2023 2:49:10 PM By: Samuella Bruin Entered By: Samuella Bruin on 01/12/2023 08:48:56 -------------------------------------------------------------------------------- Multi Wound Chart Details Patient Name: Date of Service: [name]. 01/12/2023 11:30 A M Medical Record  Number: [id number] Patient Account Number: [id number] Date of Birth/Sex: Treating RN: September 29, 1945 (77 y.o. M) Primary Care Dayanara Sherrill: Oliver Barre Other Clinician: Referring Zoeann Mol: Treating Merrillyn Ackerley/Extender: Joeseph Amor in Treatment: 3 Vital Signs Height(in): 70 Pulse(bpm): 81 Weight(lbs): 186 Blood Pressure(mmHg): 95/70 Body Mass Index(BMI): 26.7 Temperature(F): 97.2 Respiratory Rate(breaths/min): 18 [1:Photos:] [2:No Photos] [N/A:N/A [id number] Page 3 of 8] Right, Medial, Plantar Foot Left, Medial, Dorsal Foot N/A Wound Location: Gradually Appeared Trauma N/A Wounding Event: Diabetic Wound/Ulcer of the Lower Diabetic Wound/Ulcer of the Lower N/A Primary Etiology: Extremity Extremity Cataracts, Arrhythmia, Congestive Cataracts, Arrhythmia, Congestive N/A Comorbid History:  Heart Failure, Coronary Artery Heart Failure, Coronary Artery Disease, Hypertension, Type II Disease, Hypertension, Type II Diabetes, Neuropathy Diabetes, Neuropathy 05/30/2022 12/15/2022 N/A Date Acquired: 3 3 N/A Weeks of Treatment: Open Open N/A Wound Status: No No N/A Wound Recurrence: 0.9x1x0.2 0x0x0 N/A Measurements [name] (cm) 0.707 0 N/A A (cm) : rea 0.141 0 N/A Volume (cm) : 30.80% 100.00% N/A % Reduction in A rea: 53.90% 100.00% N/A % Reduction in Volume: Grade 2 Grade 1 N/A Classification: Medium None Present N/A Exudate A mount: Serosanguineous N/A N/A Exudate Type: red, brown N/A N/A Exudate Color: Thickened Flat and Intact N/A Wound Margin: Large (67-100%) None Present (0%) N/A Granulation A mount: Red, Pink N/A N/A Granulation Quality: Small (1-33%) None Present (0%) N/A Necrotic A mount: Fat Layer (Subcutaneous Tissue): Yes Fascia: No N/A Exposed Structures: Fascia: No Fat Layer (Subcutaneous Tissue): No Tendon: No Tendon: No Muscle: No Muscle: No Joint: No Joint: No Bone: No Bone: No Small (1-33%) Large  (67-100%) N/A Epithelialization: Callus: Yes No Abnormalities Noted N/A Periwound Skin Texture: No Abnormalities Noted No Abnormalities Noted N/A Periwound Skin Moisture: No Abnormalities Noted No Abnormalities Noted N/A Periwound Skin Color: No Abnormality No Abnormality N/A Temperature: Treatment Notes Electronic Signature(s) Signed: 01/12/2023 11:52:44 AM By: Duanne Guess MD FACS Entered By: Duanne Guess on 01/12/2023 08:52:44 -------------------------------------------------------------------------------- Multi-Disciplinary Care Plan Details Patient Name: Date of Service: [name]. 01/12/2023 11:30 A M Medical Record Number: [id number] Patient Account Number: [id number] Date of Birth/Sex: Treating RN: April 04, 1945 (77 y.o. [name] Primary Care Uthman Mroczkowski: Oliver Barre Other Clinician: Referring Maryse Brierley: Treating Miroslav Gin/Extender: Joeseph Amor in Treatment: 3 Active Inactive Wound/Skin Impairment Nursing Diagnoses: Impaired tissue integrity Goals: Patient/caregiver will verbalize understanding of skin care regimen Date Initiated: 12/16/2022 Target Resolution Date: 02/28/2023 Goal Status: Active [name], [name] ([id number]) [phone].pdf Page 4 of 8 Interventions: Assess ulceration(s) every visit Treatment Activities: Skin care regimen initiated : 12/16/2022 Notes: Electronic Signature(s) Signed: 01/12/2023 2:49:10 PM By: Samuella Bruin Entered By: Samuella Bruin on 01/12/2023 09:07:18 -------------------------------------------------------------------------------- Pain Assessment Details Patient Name: Date of Service: [name]. 01/12/2023 11:30 A M Medical Record Number: [id number] Patient Account Number: [id number] Date of Birth/Sex: Treating RN: 20-Jul-1945 (77 y.o. [name] Primary Care Niki Payment: Oliver Barre Other Clinician: Referring Rex Oesterle: Treating  Avarie Tavano/Extender: Joeseph Amor in Treatment: 3 Active Problems Location of Pain Severity and Description of Pain Patient Has Paino No Site Locations Rate the pain. Current Pain Level: 0 Pain Management and Medication Current Pain Management: Electronic Signature(s) Signed: 01/12/2023 2:49:10 PM By: Samuella Bruin Entered By: Samuella Bruin on 01/12/2023 08:48:54 -------------------------------------------------------------------------------- Patient/Caregiver Education Details Patient Name: Date of Service: [name]. 11/14/2024andnbsp11:30 A M Medical Record Number: [id number] Patient Account Number: [id number] Date of Birth/Gender: Treating RN: 03/16/1945 (77 y.o. [name] Primary Care Physician: Oliver Barre Other Clinician: Referring Physician: Treating Physician/Extender: [name], [name] ([id number]) [id number] Page 5 of 8 Weeks in Treatment: 3 Education Assessment Education Provided To: Patient Education Topics Provided Wound/Skin Impairment: Methods: Explain/Verbal Responses: Reinforcements needed, State content correctly Electronic Signature(s) Signed: 01/12/2023 2:49:10 PM By: Samuella Bruin Entered By: Samuella Bruin on 01/12/2023 09:07:27 -------------------------------------------------------------------------------- Wound Assessment Details Patient Name: Date of Service: [name]. 01/12/2023 11:30 A M Medical Record Number: [id number] Patient Account Number: [id number] Date of Birth/Sex: Treating RN: [birth year] (77 y.o. [name] Primary Care Issiac Jamar: Oliver Barre Other Clinician: Referring Siris Hoos: Treating Latresa Gasser/Extender: [name] Gary  Franky [name], [name] Weeks in Treatment: 3 Wound Status Wound Number: 1 Primary Diabetic Wound/Ulcer of the Lower Extremity Etiology: Wound Location: Right, Medial, Plantar Foot Wound  Open Wounding Event: Gradually Appeared Status: Date Acquired: 05/30/2022 Comorbid Cataracts, Arrhythmia, Congestive Heart Failure, Coronary Artery Weeks Of Treatment: 3 History: Disease, Hypertension, Type II Diabetes, Neuropathy Clustered Wound: No Photos Wound Measurements Length: (cm) 0.9 Width: (cm) 1 Depth: (cm) 0.2 Area: (cm) 0.707 Volume: (cm) 0.141 % Reduction in Area: 30.8% % Reduction in Volume: 53.9% Epithelialization: Small (1-33%) Tunneling: No Undermining: No Wound Description Classification: Grade 2 Wound Margin: Thickened Exudate Amount: Medium Exudate Type: Serosanguineous Exudate Color: red, brown Foul Odor After Cleansing: No Slough/Fibrino Yes Wound Bed Mancusi, [name] ([id number]) [phone].pdf Page 6 of 8 Granulation Amount: Large (67-100%) Exposed Structure Granulation Quality: Red, Pink Fascia Exposed: No Necrotic Amount: Small (1-33%) Fat Layer (Subcutaneous Tissue) Exposed: Yes Necrotic Quality: Adherent Slough Tendon Exposed: No Muscle Exposed: No Joint Exposed: No Bone Exposed: No Periwound Skin Texture Texture Color No Abnormalities Noted: No No Abnormalities Noted: Yes Callus: Yes Temperature / Pain Temperature: No Abnormality Moisture No Abnormalities Noted: Yes Treatment Notes Wound #1 (Foot) Wound Laterality: Plantar, Right, Medial Cleanser Soap and Water Discharge Instruction: May shower and wash wound with dial antibacterial soap and water prior to dressing change. Vashe 5.8 (oz) Discharge Instruction: Or Cleanse the wound with Vashe prior to applying a clean dressing using gauze sponges, not tissue or cotton balls. Peri-Wound Care Zinc Oxide Ointment 30g tube Discharge Instruction: Apply Zinc Oxide to periwound with each dressing change Topical Gentamicin Discharge Instruction: As directed by physician Mupirocin Ointment Discharge Instruction: Apply Mupirocin (Bactroban) as instructed Primary  Dressing Maxorb Extra Ag+ Alginate Dressing, 2x2 (in/in) Discharge Instruction: Apply to wound bed as instructed Secondary Dressing Optifoam Non-Adhesive Dressing, 4x4 in Discharge Instruction: Apply over primary dressing -cut into a "donut" Woven Gauze Sponge, Non-Sterile 4x4 in Discharge Instruction: Apply over primary dressing as directed. Secured With 9M Medipore H Soft Cloth Surgical T ape, 4 x 10 (in/yd) Discharge Instruction: Secure with tape as directed. Compression Wrap Compression Stockings Add-Ons Electronic Signature(s) Signed: 01/12/2023 2:49:10 PM By: Samuella Bruin Entered By: Samuella Bruin on 01/12/2023 08:52:09 -------------------------------------------------------------------------------- Wound Assessment Details Patient Name: Date of Service: [name]. 01/12/2023 11:30 A M Medical Record Number: [id number] Patient Account Number: [id number] Date of Birth/Sex: Treating RN: May 05, 1945 (77 y.o. [name] Primary Care Angellynn Kimberlin: Oliver Barre Other Clinician: Referring Cleotilde Spadaccini: Treating Tamecka Milham/Extender: [name], [name] ([id number]) [id number] Page 7 of 8 Weeks in Treatment: 3 Wound Status Wound Number: 2 Primary Diabetic Wound/Ulcer of the Lower Extremity Etiology: Wound Location: Left, Medial, Dorsal Foot Wound Open Wounding Event: Trauma Status: Date Acquired: 12/15/2022 Comorbid Cataracts, Arrhythmia, Congestive Heart Failure, Coronary Artery Weeks Of Treatment: 3 History: Disease, Hypertension, Type II Diabetes, Neuropathy Clustered Wound: No Photos Wound Measurements Length: (cm) Width: (cm) Depth: (cm) Area: (cm) Volume: (cm) 0 % Reduction in Area: 100% 0 % Reduction in Volume: 100% 0 Epithelialization: Large (67-100%) 0 Tunneling: No 0 Undermining: No Wound Description Classification: Grade 1 Wound Margin: Flat and Intact Exudate Amount: None Present Foul  Odor After Cleansing: No Slough/Fibrino No Wound Bed Granulation Amount: None Present (0%) Exposed Structure Necrotic Amount: None Present (0%) Fascia Exposed: No Fat Layer (Subcutaneous Tissue) Exposed: No Tendon Exposed: No Muscle Exposed: No Joint Exposed: No Bone Exposed: No Periwound Skin Texture Texture Color No Abnormalities Noted: Yes No Abnormalities Noted: Yes Moisture Temperature / Pain  No Abnormalities Noted: Yes Temperature: No Abnormality Electronic Signature(s) Signed: 01/12/2023 2:49:10 PM By: Samuella Bruin Entered By: Samuella Bruin on 01/12/2023 08:53:11 -------------------------------------------------------------------------------- Vitals Details Patient Name: Date of Service: [name]. 01/12/2023 11:30 A M Medical Record Number: [id number] Patient Account Number: [id number] Date of Birth/Sex: Treating RN: 01-23-1946 (77 y.o. [name] Primary Care Dekota Shenk: Oliver Barre Other Clinician: Referring Nuriya Stuck: Treating Selam Pietsch/Extender: Joeseph Amor in Treatment: 3 [name], [name] ([id number]) [id number] Page 8 of 8 Vital Signs Time Taken: 11:48 Temperature (F): 97.2 Height (in): 70 Pulse (bpm): 81 Weight (lbs): 186 Respiratory Rate (breaths/min): 18 Body Mass Index (BMI): 26.7 Blood Pressure (mmHg): 95/70 Reference Range: 80 - 120 mg / dl Electronic Signature(s) Signed: 01/12/2023 2:49:10 PM By: Samuella Bruin Entered By: Samuella Bruin on 01/12/2023 08:50:11

## 2023-01-12 NOTE — Progress Notes (Addendum)
RABURN, WIGAL ([id number]) [id number] Page 1 of 9 Visit Report for 01/12/2023 Chief Complaint Document Details Patient Name: Date of Service: [name], [name] MES [name]. 01/12/2023 11:30 A M Medical Record Number: [id number] Patient Account Number: [id number] Date of Birth/Sex: Treating RN: 08-17-1945 (77 y.o. M) Primary Care Provider: Oliver Barre Other Clinician: Referring Provider: Treating Provider/Extender: Joeseph Amor in Treatment: 3 Information Obtained from: Patient Chief Complaint Patients presents for treatment of an open diabetic ulcer Electronic Signature(s) Signed: 01/12/2023 11:52:53 AM By: Duanne Guess MD FACS Entered By: Duanne Guess on 01/12/2023 11:52:52 -------------------------------------------------------------------------------- Debridement Details Patient Name: Date of Service: [name] Aase MES [name]. 01/12/2023 11:30 A M Medical Record Number: [id number] Patient Account Number: [id number] Date of Birth/Sex: Treating RN: 1945/10/18 (77 y.o. Marlan Palau Primary Care Provider: Oliver Barre Other Clinician: Referring Provider: Treating Provider/Extender: Joeseph Amor in Treatment: 3 Debridement Performed for Assessment: Wound #1 Right,Medial,Plantar Foot Performed By: Physician Duanne Guess, MD The following information was scribed by: Samuella Bruin The information was scribed for: Duanne Guess Debridement Type: Debridement Severity of Tissue Pre Debridement: Fat layer exposed Level of Consciousness (Pre-procedure): Awake and Alert Pre-procedure Verification/Time Out Yes - 11:55 Taken: Start Time: 11:55 Pain Control: Lidocaine 4% T opical Solution Percent of Wound Bed Debrided: 100% T Area Debrided (cm): otal 0.71 Tissue and other material debrided: Non-Viable, Callus, Slough, Subcutaneous, Skin: Epidermis, Slough Level: Skin/Subcutaneous Tissue Debridement  Description: Excisional Instrument: Curette Specimen: Tissue Culture Number of Specimens T aken: 1 Bleeding: Minimum Hemostasis Achieved: Pressure Response to Treatment: Procedure was tolerated well Level of Consciousness (Post- Awake and Alert procedure): Post Debridement Measurements of Total Wound Length: (cm) 0.9 Width: (cm) 1 Depth: (cm) 0.2 Volume: (cm) 0.141 Julia, Manish [name] ([id number]) [phone].pdf Page 2 of 9 Character of Wound/Ulcer Post Debridement: Improved Severity of Tissue Post Debridement: Fat layer exposed Post Procedure Diagnosis Same as Pre-procedure Electronic Signature(s) Signed: 01/12/2023 2:49:10 PM By: Samuella Bruin Signed: 01/12/2023 3:32:46 PM By: Duanne Guess MD FACS Entered By: Samuella Bruin on 01/12/2023 11:57:45 -------------------------------------------------------------------------------- HPI Details Patient Name: Date of Service: [name] Aase MES [name]. 01/12/2023 11:30 A M Medical Record Number: [id number] Patient Account Number: [id number] Date of Birth/Sex: Treating RN: 1945-05-09 (77 y.o. M) Primary Care Provider: Oliver Barre Other Clinician: Referring Provider: Treating Provider/Extender: Joeseph Amor in Treatment: 3 History of Present Illness HPI Description: ADMISSION 12/16/2022 ***ABIs [name]: 0.95; R: 0.88*** This is a 77 year old type II diabetic (last hemoglobin A1c 7.2% on November 20, 2022) with a fairly extensive cardiac and peripheral vascular history. 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 Most recently, he has been followed by a podiatrist in [place] for right and left diabetic foot ulcers on the first metatarsal head. He says they were at first applying some kind of cream, the name of which he does not remember and have now been applying what he describes as a "some white stuff." Due to failure of the wounds to heal, he was referred  to the wound care center for further evaluation and management. 12/29/2022: The left foot wound is healed. The right first metatarsal head wound is surrounded with macerated callus and skin. There is some slough on the surface. 01/12/2023: The tissue maceration is much better this week. There is an odor coming from the wound, however. The wound is stable in size. Electronic Signature(s) Signed: 01/12/2023 12:04:37 PM By: Duanne Guess MD FACS Entered By: Duanne Guess on 01/12/2023 12:04:37 -------------------------------------------------------------------------------- Physical  Exam Details Patient Name: Date of Service: [name], [name] MES [name]. 01/12/2023 11:30 A M Medical Record Number: [id number] Patient Account Number: [id number] Date of Birth/Sex: Treating RN: 1945/07/23 (77 y.o. M) Primary Care Provider: Oliver Barre Other Clinician: Referring Provider: Treating Provider/Extender: Joeseph Amor in Treatment: 3 Constitutional Hypotensive, but normal for this patient. . . . no acute distress. Respiratory Normal work of breathing on room air.. Notes 01/12/2023: The tissue maceration is much better this week. There is an odor coming from the wound, however. The wound is stable in size. [name], LAGRAVE ([id number]) [id number] Page 3 of 9 Electronic Signature(s) Signed: 01/12/2023 12:05:07 PM By: Duanne Guess MD FACS Entered By: Duanne Guess on 01/12/2023 12:05:07 -------------------------------------------------------------------------------- Physician Orders Details Patient Name: Date of Service: [name] Aase MES [name]. 01/12/2023 11:30 A M Medical Record Number: [id number] Patient Account Number: [id number] Date of Birth/Sex: Treating RN: 1945-03-25 (77 y.o. Marlan Palau Primary Care Provider: Oliver Barre Other Clinician: Referring Provider: Treating Provider/Extender: Joeseph Amor in Treatment:  3 The following information was scribed by: Samuella Bruin The information was scribed for: Duanne Guess Verbal / Phone Orders: No Diagnosis Coding ICD-10 Coding Code Description [phone] Non-pressure chronic ulcer of other part of right foot with fat layer exposed E11.621 Type 2 diabetes mellitus with foot ulcer I73.9 Peripheral vascular disease, unspecified E11.49 Type 2 diabetes mellitus with other diabetic neurological complication L84 Corns and callosities Follow-up Appointments ppointment in 2 weeks. - Dr. Lady Gary Room 3 Return A Anesthetic (In clinic) Topical Lidocaine 4% applied to wound bed Bathing/ Shower/ Hygiene May shower and wash wound with soap and water. - After showering/Bathing, if you choose, you may perform the wound dressing changes Off-Loading Wound #1 Right,Medial,Plantar Foot Wedge shoe to: - right foot - wear when walking, try to keep pressure off wound area at all times Wound Treatment Wound #1 - Foot Wound Laterality: Plantar, Right, Medial Cleanser: Soap and Water 1 x Per Day/30 Days Discharge Instructions: May shower and wash wound with dial antibacterial soap and water prior to dressing change. Cleanser: Vashe 5.8 (oz) 1 x Per Day/30 Days Discharge Instructions: Or Cleanse the wound with Vashe prior to applying a clean dressing using gauze sponges, not tissue or cotton balls. Peri-Wound Care: Zinc Oxide Ointment 30g tube 1 x Per Day/30 Days Discharge Instructions: Apply Zinc Oxide to periwound with each dressing change Topical: Gentamicin 1 x Per Day/30 Days Discharge Instructions: As directed by physician Topical: Mupirocin Ointment 1 x Per Day/30 Days Discharge Instructions: Apply Mupirocin (Bactroban) as instructed Prim Dressing: Maxorb Extra Ag+ Alginate Dressing, 2x2 (in/in) (Generic) 1 x Per Day/30 Days ary Discharge Instructions: Apply to wound bed as instructed Secondary Dressing: Optifoam Non-Adhesive Dressing, 4x4 in (Generic) 1 x Per  Day/30 Days Discharge Instructions: Apply over primary dressing -cut into a "donut" Secondary Dressing: Woven Gauze Sponge, Non-Sterile 4x4 in 1 x Per Day/30 Days Discharge Instructions: Apply over primary dressing as directed. Secured With: 33M Medipore H Soft Cloth Surgical Tape, 4 x 10 (in/yd) 1 x Per Day/30 Days ZYGMUND, SIEGMANN ([id number]) [phone].pdf Page 4 of 9 Discharge Instructions: Secure with tape as directed. Laboratory naerobe culture (MICRO) - PCR of nonhealing wound to right foot - (ICD10 L97.512 - Non- Bacteria identified in Unspecified specimen by A pressure chronic ulcer of other part of right foot with fat layer exposed) LOINC Code: 635-3 Convenience Name: Anaerobic culture Patient Medications llergies: Salmon, Sulfa Antibiotics A Notifications Medication Indication Start End 01/12/2023 lidocaine  DOSE topical 4 % cream - cream topical 01/12/2023 gentamicin DOSE topical 0.1 % ointment - Apply thin layer to wound surface with daily dressing changes 01/12/2023 mupirocin DOSE topical 2 % ointment - Apply thin layer to wound surface with daily dressing changes Electronic Signature(s) Signed: 01/12/2023 3:32:46 PM By: Duanne Guess MD FACS Previous Signature: 01/12/2023 12:06:49 PM Version By: Duanne Guess MD FACS Entered By: Duanne Guess on 01/12/2023 12:07:08 -------------------------------------------------------------------------------- Problem List Details Patient Name: Date of Service: [name] Aase MES [name]. 01/12/2023 11:30 A M Medical Record Number: [id number] Patient Account Number: [id number] Date of Birth/Sex: Treating RN: 1945/09/13 (77 y.o. M) Primary Care Provider: Oliver Barre Other Clinician: Referring Provider: Treating Provider/Extender: Joeseph Amor in Treatment: 3 Active Problems ICD-10 Encounter Code Description Active Date MDM Diagnosis L97.512 Non-pressure chronic ulcer of other part of  right foot with fat layer exposed 12/16/2022 No Yes E11.621 Type 2 diabetes mellitus with foot ulcer 12/16/2022 No Yes I73.9 Peripheral vascular disease, unspecified 12/16/2022 No Yes E11.49 Type 2 diabetes mellitus with other diabetic neurological complication 12/16/2022 No Yes L84 Corns and callosities 12/16/2022 No Yes Inactive Problems Resolved Problems [name], [name] ([id number]) [id number] Page 5 of 9 ICD-10 Code Description Active Date Resolved Date L97.521 Non-pressure chronic ulcer of other part of left foot limited to breakdown of skin 12/16/2022 12/16/2022 Electronic Signature(s) Signed: 01/12/2023 11:45:24 AM By: Duanne Guess MD FACS Entered By: Duanne Guess on 01/12/2023 11:45:23 -------------------------------------------------------------------------------- Progress Note Details Patient Name: Date of Service: [name] Aase MES [name]. 01/12/2023 11:30 A M Medical Record Number: [id number] Patient Account Number: [id number] Date of Birth/Sex: Treating RN: 04-06-1945 (77 y.o. M) Primary Care Provider: Oliver Barre Other Clinician: Referring Provider: Treating Provider/Extender: Joeseph Amor in Treatment: 3 Subjective Chief Complaint Information obtained from Patient Patients presents for treatment of an open diabetic ulcer History of Present Illness (HPI) ADMISSION 12/16/2022 ***ABIs [name]: 0.95; R: 0.88*** This is a 77 year old type II diabetic (last hemoglobin A1c 7.2% on November 20, 2022) with a fairly extensive cardiac and peripheral vascular history. 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 Most recently, he has been followed by a podiatrist in [place] for right and left diabetic foot ulcers on the first metatarsal head. He says they were at first applying some kind of cream, the name of which he does not remember and have now been applying what he describes as a "some white  stuff." Due to failure of the wounds to heal, he was referred to the wound care center for further evaluation and management. 12/29/2022: The left foot wound is healed. The right first metatarsal head wound is surrounded with macerated callus and skin. There is some slough on the surface. 01/12/2023: The tissue maceration is much better this week. There is an odor coming from the wound, however. The wound is stable in size. Patient History Information obtained from Patient, Chart. Family History Unknown History. Social History Never smoker, Marital Status - Divorced, Alcohol Use - Never, Drug Use - No History, Caffeine Use - Never. Medical History Eyes Patient has history of Cataracts - Cataract Extraction Cardiovascular Patient has history of Arrhythmia - A.Fib;V.Fib, Congestive Heart Failure - Chronic Systolic HF, Coronary Artery Disease, Hypertension Endocrine Patient has history of Type II Diabetes Neurologic Patient has history of Neuropathy - Diabetic Peripheral Neuropathy Hospitalization/Surgery History - Cataract Extraction. - Implanted Cardiac Defibrillator. Medical A Surgical History Notes nd Respiratory Allergic Rhinitis Acute Bronchitis Pharyngitis Cardiovascular Ischemic Cardiomyopathy Atherosclerotic Peripheral Vascular  Disease with Gangrene Hyperlipidemia Implantable Cardioverter-Defibrillator (ICD) in situ Endocrine Last Hem A1C 7.2% on 11/21/22 Hypogonadism, Male Genitourinary Nephrolithiasis Integumentary (Skin) Neoplasm of uncertain behavior of skin Callus Right foot Skin Lesion Psychiatric ARLOS, KRUMM ([id number]) [phone].pdf Page 6 of 9 General anxiety Objective Constitutional Hypotensive, but normal for this patient. no acute distress. Vitals Time Taken: 11:48 AM, Height: 70 in, Weight: 186 lbs, BMI: 26.7, Temperature: 97.2 F, Pulse: 81 bpm, Respiratory Rate: 18 breaths/min, Blood Pressure: 95/70 mmHg. Respiratory Normal  work of breathing on room air.. General Notes: 01/12/2023: The tissue maceration is much better this week. There is an odor coming from the wound, however. The wound is stable in size. Integumentary (Hair, Skin) Wound #1 status is Open. Original cause of wound was Gradually Appeared. The date acquired was: 05/30/2022. The wound has been in treatment 3 weeks. The wound is located on the Right,Medial,Plantar Foot. The wound measures 0.9cm length x 1cm width x 0.2cm depth; 0.707cm^2 area and 0.141cm^3 volume. There is Fat Layer (Subcutaneous Tissue) exposed. There is no tunneling or undermining noted. There is a medium amount of serosanguineous drainage noted. The wound margin is thickened. There is large (67-100%) red, pink granulation within the wound bed. There is a small (1-33%) amount of necrotic tissue within the wound bed including Adherent Slough. The periwound skin appearance had no abnormalities noted for moisture. The periwound skin appearance had no abnormalities noted for color. The periwound skin appearance exhibited: Callus. Periwound temperature was noted as No Abnormality. Wound #2 status is Open. Original cause of wound was Trauma. The date acquired was: 12/15/2022. The wound has been in treatment 3 weeks. The wound is located on the Left,Medial,Dorsal Foot. The wound measures 0cm length x 0cm width x 0cm depth; 0cm^2 area and 0cm^3 volume. There is no tunneling or undermining noted. There is a none present amount of drainage noted. The wound margin is flat and intact. There is no granulation within the wound bed. There is no necrotic tissue within the wound bed. The periwound skin appearance had no abnormalities noted for texture. The periwound skin appearance had no abnormalities noted for moisture. The periwound skin appearance had no abnormalities noted for color. Periwound temperature was noted as No Abnormality. Assessment Active Problems ICD-10 Non-pressure chronic ulcer of other  part of right foot with fat layer exposed Type 2 diabetes mellitus with foot ulcer Peripheral vascular disease, unspecified Type 2 diabetes mellitus with other diabetic neurological complication Corns and callosities Procedures Wound #1 Pre-procedure diagnosis of Wound #1 is a Diabetic Wound/Ulcer of the Lower Extremity located on the Right,Medial,Plantar Foot .Severity of Tissue Pre Debridement is: Fat layer exposed. There was a Excisional Skin/Subcutaneous Tissue Debridement with a total area of 0.71 sq cm performed by Duanne Guess, MD. With the following instrument(s): Curette to remove Non-Viable tissue/material. Material removed includes Callus, Subcutaneous Tissue, Slough, and Skin: Epidermis after achieving pain control using Lidocaine 4% T opical Solution. 1 specimen was taken by a Tissue Culture and sent to the lab per facility protocol. A time out was conducted at 11:55, prior to the start of the procedure. A Minimum amount of bleeding was controlled with Pressure. The procedure was tolerated well. Post Debridement Measurements: 0.9cm length x 1cm width x 0.2cm depth; 0.141cm^3 volume. Character of Wound/Ulcer Post Debridement is improved. Severity of Tissue Post Debridement is: Fat layer exposed. Post procedure Diagnosis Wound #1: Same as Pre-Procedure Plan Follow-up Appointments: Return Appointment in 2 weeks. - Dr. Lady Gary Room 3 Anesthetic: (In clinic)  Topical Lidocaine 4% applied to wound bed Bathing/ Shower/ Hygiene: May shower and wash wound with soap and water. - After showering/Bathing, if you choose, you may perform the wound dressing changes Off-Loading: Wound #1 Right,Medial,Plantar Foot: REECENathaniel, Longan [name] ([id number]) [phone].pdf Page 7 of 9 Wedge shoe to: - right foot - wear when walking, try to keep pressure off wound area at all times Laboratory ordered were: Anaerobic culture - PCR of nonhealing wound to right foot The following  medication(s) was prescribed: lidocaine topical 4 % cream cream topical was prescribed at facility gentamicin topical 0.1 % ointment Apply thin layer to wound surface with daily dressing changes starting 01/12/2023 mupirocin topical 2 % ointment Apply thin layer to wound surface with daily dressing changes starting 01/12/2023 WOUND #1: - Foot Wound Laterality: Plantar, Right, Medial Cleanser: Soap and Water 1 x Per Day/30 Days Discharge Instructions: May shower and wash wound with dial antibacterial soap and water prior to dressing change. Cleanser: Vashe 5.8 (oz) 1 x Per Day/30 Days Discharge Instructions: Or Cleanse the wound with Vashe prior to applying a clean dressing using gauze sponges, not tissue or cotton balls. Peri-Wound Care: Zinc Oxide Ointment 30g tube 1 x Per Day/30 Days Discharge Instructions: Apply Zinc Oxide to periwound with each dressing change Topical: Gentamicin 1 x Per Day/30 Days Discharge Instructions: As directed by physician Topical: Mupirocin Ointment 1 x Per Day/30 Days Discharge Instructions: Apply Mupirocin (Bactroban) as instructed Prim Dressing: Maxorb Extra Ag+ Alginate Dressing, 2x2 (in/in) (Generic) 1 x Per Day/30 Days ary Discharge Instructions: Apply to wound bed as instructed Secondary Dressing: Optifoam Non-Adhesive Dressing, 4x4 in (Generic) 1 x Per Day/30 Days Discharge Instructions: Apply over primary dressing -cut into a "donut" Secondary Dressing: Woven Gauze Sponge, Non-Sterile 4x4 in 1 x Per Day/30 Days Discharge Instructions: Apply over primary dressing as directed. Secured With: 41M Medipore H Soft Cloth Surgical T ape, 4 x 10 (in/yd) 1 x Per Day/30 Days Discharge Instructions: Secure with tape as directed. 01/12/2023: The tissue maceration is much better this week. There is an odor coming from the wound, however. The wound is stable in size. I used a curette to debride callus, slough, skin, and subcutaneous tissue from his wound. I then took a  culture due to the odor. I will await the culture results before proceeding with any systemic antibiotic therapy, but we are going to add topical gentamicin and mupirocin to the wound surface. Continue silver alginate with periwound zinc oxide and a foam border dressing. He is wearing a forefoot offloading shoe. Follow-up in 1 to 2 weeks. Electronic Signature(s) Signed: 01/12/2023 12:08:08 PM By: Duanne Guess MD FACS Entered By: Duanne Guess on 01/12/2023 12:08:08 -------------------------------------------------------------------------------- HxROS Details Patient Name: Date of Service: [name] Aase MES [name]. 01/12/2023 11:30 A M Medical Record Number: [id number] Patient Account Number: [id number] Date of Birth/Sex: Treating RN: March 23, 1945 (77 y.o. 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; Coronary Artery Disease; Hypertension Past Medical History Notes: 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 Hyperlipidemia [name], [name] ([id number]) [id number].pdf Page 8 of 9 Implantable Cardioverter-Defibrillator (ICD) in situ Endocrine Medical History: Positive for: Type II Diabetes Past Medical History Notes: Last Hem A1C 7.2%  on 11/21/22 Hypogonadism, Male Time with diabetes: 6 years Treated with: Insulin Genitourinary Medical History: Past Medical History Notes: Nephrolithiasis Integumentary (Skin) Medical History: Past Medical History Notes: Neoplasm of uncertain behavior  of skin Callus Right foot Skin Lesion Neurologic Medical History: Positive for: Neuropathy - Diabetic Peripheral Neuropathy Psychiatric Medical History: Past Medical History Notes: General anxiety HBO Extended History Items Eyes: Cataracts Immunizations Pneumococcal Vaccine: Received Pneumococcal Vaccination: Yes Received Pneumococcal Vaccination On or After 60th Birthday: Yes Implantable Devices Yes Hospitalization / Surgery History Type of Hospitalization/Surgery Cataract Extraction Implanted Cardiac Defibrillator Family and Social History Unknown History: Yes; Never smoker; Marital Status - Divorced; Alcohol Use: Never; Drug Use: No History; Caffeine Use: Never; Financial Concerns: No; Food, Clothing or Shelter Needs: No; Support System Lacking: No; Transportation Concerns: No Electronic Signature(s) Signed: 01/12/2023 3:32:46 PM By: Duanne Guess MD FACS Entered By: Duanne Guess on 01/12/2023 12:04:43 -------------------------------------------------------------------------------- SuperBill Details Patient Name: Date of Service: [name] Aase MES [name]. 01/12/2023 Medical Record Number: [id number] Patient Account Number: [id number] Date of Birth/Sex: Treating RN: 1945-04-08 (77 y.o. [name], [name] ([id number]) [id number] Page 9 of 9 Primary Care Provider: Oliver Barre Other Clinician: Referring Provider: Treating Provider/Extender: Joeseph Amor in Treatment: 3 Diagnosis Coding ICD-10 Codes Code Description [phone] Non-pressure chronic ulcer of other part of right foot with fat layer exposed E11.621 Type 2 diabetes mellitus with foot ulcer I73.9 Peripheral vascular disease, unspecified E11.49 Type 2 diabetes mellitus with other diabetic neurological complication L84 Corns and callosities Facility Procedures : CPT4 Code: [id number] Description: 11042 - DEB SUBQ TISSUE 20 SQ CM/< ICD-10 Diagnosis Description L97.512  Non-pressure chronic ulcer of other part of right foot with fat layer exposed Modifier: Quantity: 1 Physician Procedures : CPT4 Code Description Modifier [id number] - WC PHYS LEVEL 4 - EST PT ICD-10 Diagnosis Description L97.512 Non-pressure chronic ulcer of other part of right foot with fat layer exposed E11.621 Type 2 diabetes mellitus with foot ulcer I73.9 Peripheral  vascular disease, unspecified E11.49 Type 2 diabetes mellitus with other diabetic neurological complication Quantity: 1 : [id number] 11042 - WC PHYS SUBQ TISS 20 SQ CM ICD-10 Diagnosis Description L97.512 Non-pressure chronic ulcer of other part of right foot with fat layer exposed Quantity: 1 Electronic Signature(s) Signed: 01/12/2023 12:08:28 PM By: Duanne Guess MD FACS Entered By: Duanne Guess on 01/12/2023 12:08:28

## 2023-01-19 ENCOUNTER — Encounter (HOSPITAL_BASED_OUTPATIENT_CLINIC_OR_DEPARTMENT_OTHER): Payer: Medicare Other | Admitting: General Surgery

## 2023-01-19 DIAGNOSIS — B965 Pseudomonas (aeruginosa) (mallei) (pseudomallei) as the cause of diseases classified elsewhere: Secondary | ICD-10-CM | POA: Diagnosis not present

## 2023-01-19 DIAGNOSIS — I5022 Chronic systolic (congestive) heart failure: Secondary | ICD-10-CM | POA: Diagnosis not present

## 2023-01-19 DIAGNOSIS — L97512 Non-pressure chronic ulcer of other part of right foot with fat layer exposed: Secondary | ICD-10-CM | POA: Diagnosis not present

## 2023-01-19 DIAGNOSIS — Z89431 Acquired absence of right foot: Secondary | ICD-10-CM | POA: Diagnosis not present

## 2023-01-19 DIAGNOSIS — B958 Unspecified staphylococcus as the cause of diseases classified elsewhere: Secondary | ICD-10-CM | POA: Diagnosis not present

## 2023-01-19 DIAGNOSIS — E11621 Type 2 diabetes mellitus with foot ulcer: Secondary | ICD-10-CM | POA: Diagnosis not present

## 2023-01-19 DIAGNOSIS — L84 Corns and callosities: Secondary | ICD-10-CM | POA: Diagnosis not present

## 2023-01-19 DIAGNOSIS — I110 Hypertensive heart disease with heart failure: Secondary | ICD-10-CM | POA: Diagnosis not present

## 2023-01-19 DIAGNOSIS — B952 Enterococcus as the cause of diseases classified elsewhere: Secondary | ICD-10-CM | POA: Diagnosis not present

## 2023-01-19 DIAGNOSIS — E1142 Type 2 diabetes mellitus with diabetic polyneuropathy: Secondary | ICD-10-CM | POA: Diagnosis not present

## 2023-01-19 DIAGNOSIS — E1151 Type 2 diabetes mellitus with diabetic peripheral angiopathy without gangrene: Secondary | ICD-10-CM | POA: Diagnosis not present

## 2023-01-19 DIAGNOSIS — E1149 Type 2 diabetes mellitus with other diabetic neurological complication: Secondary | ICD-10-CM | POA: Diagnosis not present

## 2023-01-19 DIAGNOSIS — B955 Unspecified streptococcus as the cause of diseases classified elsewhere: Secondary | ICD-10-CM | POA: Diagnosis not present

## 2023-01-19 NOTE — Progress Notes (Signed)
[name], [name] ([id number]) [id number] Page 1 of 7 Visit Report for 01/19/2023 Arrival Information Details Patient Name: Date of Service: [name], [name]. 01/19/2023 11:00 A M Medical Record Number: [id number] Patient Account Number: [id number] Date of Birth/Sex: Treating RN: 1945-12-14 (77 y.o. M) Primary Care [name]: [name] Other Clinician: Referring [name]: Treating [name]/Extender: [name] in Treatment: 4 Visit Information History Since Last Visit Added or deleted any medications: No Patient Arrived: Ambulatory Any new allergies or adverse reactions: No Arrival Time: 11:07 Had a fall or experienced change in No Accompanied By: self activities of daily living that may affect Transfer Assistance: None risk of falls: Patient Identification Verified: Yes Signs or symptoms of abuse/neglect since last visito No Secondary Verification Process Completed: Yes Hospitalized since last visit: No Patient Has Alerts: Yes Implantable device outside of the clinic excluding No Patient Alerts: Patient on Blood Thinner cellular tissue based products placed in the center Xarelto since last visit: Pain Present Now: No Electronic Signature(s) Signed: 01/19/2023 1:49:04 PM By: [name] Entered By: [name] on 01/19/2023 11:07:24 -------------------------------------------------------------------------------- Encounter Discharge Information Details Patient Name: Date of Service: [name]. 01/19/2023 11:00 A M Medical Record Number: [id number] Patient Account Number: [id number] Date of Birth/Sex: Treating RN: [birth year] (77 y.o. [name] Primary Care Jeovanni Heuring: [name] Other Clinician: Referring Kharter Brew: Treating Eugene Zeiders/Extender: [name] in Treatment: 4 Encounter Discharge Information Items Post Procedure Vitals Discharge Condition: Stable Temperature (F): 97.6 Ambulatory  Status: Ambulatory Pulse (bpm): 87 Discharge Destination: Home Respiratory Rate (breaths/min): 18 Transportation: Private Auto Blood Pressure (mmHg): 91/61 Accompanied By: self Schedule Follow-up Appointment: Yes Clinical Summary of Care: Patient Declined Electronic Signature(s) Signed: 01/19/2023 4:04:30 PM By: [name] Pulling RN, BSN Entered By: [name] on 01/19/2023 11:46:08 [name] ([id number]) [id number].pdf Page 2 of 7 -------------------------------------------------------------------------------- Lower Extremity Assessment Details Patient Name: Date of Service: [name], [name]. 01/19/2023 11:00 A M Medical Record Number: [id number] Patient Account Number: [id number] Date of Birth/Sex: Treating RN: 02-27-1946 (77 y.o. M) Primary Care Stephania Macfarlane: [name] Other Clinician: Referring Roan Miklos: Treating Chantalle Defilippo/Extender: [name] in Treatment: 4 Edema Assessment Assessed: [Left: No] [Right: No] Edema: [Left: No] [Right: No] Calf Left: Right: Point of Measurement: 29 cm From Medial Instep 36 cm 35.5 cm Ankle Left: Right: Point of Measurement: 10 cm From Medial Instep 22 cm 21 cm Vascular Assessment Pulses: Dorsalis Pedis Palpable: [Right:Yes] Extremity colors, hair growth, and conditions: Extremity Color: [Left:Normal] [Right:Normal] Hair Growth on Extremity: [Left:Yes] [Right:Yes] Temperature of Extremity: [Left:Warm] [Right:Warm] Capillary Refill: [Left:< 3 seconds] [Right:< 3 seconds] Dependent Rubor: [Left:No No] [Right:No No] Electronic Signature(s) Signed: 01/19/2023 1:49:04 PM By: [name] Entered By: [name] on 01/19/2023 11:19:30 -------------------------------------------------------------------------------- Multi Wound Chart Details Patient Name: Date of Service: [name]. 01/19/2023 11:00 A M Medical Record Number: [id number] Patient Account Number: [id number] Date of Birth/Sex:  Treating RN: 10/08/1945 (77 y.o. M) Primary Care Amahd Morino: [name] Other Clinician: Referring Haifa Hatton: Treating Aarna Mihalko/Extender: [name] in Treatment: 4 Vital Signs Height(in): 70 Pulse(bpm): 87 Weight(lbs): 186 Blood Pressure(mmHg): 91/61 Body Mass Index(BMI): 26.7 Temperature(F): 97.6 Respiratory Rate(breaths/min): 18 [1:Photos:] [N/A:N/A] Right, Medial, Plantar Foot N/A N/A Wound Location: Gradually Appeared N/A N/A Wounding Event: Diabetic Wound/Ulcer of the Lower N/A N/A Primary Etiology: Extremity Cataracts, Arrhythmia, Congestive N/A N/A Comorbid History: Heart Failure, Coronary Artery Disease, Hypertension, Type II Diabetes, Neuropathy 05/30/2022 N/A N/A Date Acquired: 4 N/A N/A Weeks of Treatment:  Open N/A N/A Wound Status: No N/A N/A Wound Recurrence: 1x1x0.2 N/A N/A Measurements [name] (cm) 0.785 N/A N/A A (cm) : rea 0.157 N/A N/A Volume (cm) : 23.10% N/A N/A % Reduction in A rea: 48.70% N/A N/A % Reduction in Volume: Grade 2 N/A N/A Classification: Medium N/A N/A Exudate A mount: Serosanguineous N/A N/A Exudate Type: red, brown N/A N/A Exudate Color: Thickened N/A N/A Wound Margin: Large (67-100%) N/A N/A Granulation A mount: Red, Pink N/A N/A Granulation Quality: Small (1-33%) N/A N/A Necrotic A mount: Fat Layer (Subcutaneous Tissue): Yes N/A N/A Exposed Structures: Fascia: No Tendon: No Muscle: No Joint: No Bone: No Small (1-33%) N/A N/A Epithelialization: Debridement - Excisional N/A N/A Debridement: Pre-procedure Verification/Time Out 11:33 N/A N/A Taken: Lidocaine 4% Topical Solution N/A N/A Pain Control: Callus, Subcutaneous, Slough N/A N/A Tissue Debrided: Skin/Subcutaneous Tissue N/A N/A Level: 0.78 N/A N/A Debridement A (sq cm): rea Curette N/A N/A Instrument: Minimum N/A N/A Bleeding: Pressure N/A N/A Hemostasis A chieved: Procedure was tolerated well N/A N/A Debridement  Treatment Response: 1x1x0.2 N/A N/A Post Debridement Measurements [name] (cm) 0.157 N/A N/A Post Debridement Volume: (cm) Callus: Yes N/A N/A Periwound Skin Texture: No Abnormalities Noted N/A N/A Periwound Skin Moisture: No Abnormalities Noted N/A N/A Periwound Skin Color: No Abnormality N/A N/A Temperature: Debridement N/A N/A Procedures Performed: Treatment Notes Wound #1 (Foot) Wound Laterality: Plantar, Right, Medial Cleanser Soap and Water Discharge Instruction: May shower and wash wound with dial antibacterial soap and water prior to dressing change. Vashe 5.8 (oz) Discharge Instruction: Or Cleanse the wound with Vashe prior to applying a clean dressing using gauze sponges, not tissue or cotton balls. Peri-Wound Care Zinc Oxide Ointment 30g tube Discharge Instruction: Apply Zinc Oxide to periwound with each dressing change Topical Gentamicin Discharge Instruction: As directed by physician Mupirocin Ointment Discharge Instruction: Apply Mupirocin (Bactroban) as instructed Primary Dressing MASAMI, SAMSON ([id number]) [phone].pdf Page 4 of 7 Maxorb Extra Ag+ Alginate Dressing, 2x2 (in/in) Discharge Instruction: Apply to wound bed as instructed Secondary Dressing Zetuvit Plus Silicone Border Dressing 4x4 (in/in) Discharge Instruction: Apply silicone border over primary dressing as directed. Secured With Compression Wrap Compression Stockings Facilities manager) Signed: 01/19/2023 12:27:42 PM By: Duanne Guess MD FACS Entered By: Duanne Guess on 01/19/2023 12:27:42 -------------------------------------------------------------------------------- Multi-Disciplinary Care Plan Details Patient Name: Date of Service: [name]. 01/19/2023 11:00 A M Medical Record Number: [id number] Patient Account Number: [id number] Date of Birth/Sex: Treating RN: 1945-07-23 (77 y.o. [name] Primary Care Avyana Puffenbarger: [name]  Other Clinician: Referring Daisja Kessinger: Treating Jakarie Pember/Extender: [name] in Treatment: 4 Active Inactive Wound/Skin Impairment Nursing Diagnoses: Impaired tissue integrity Goals: Patient/caregiver will verbalize understanding of skin care regimen Date Initiated: 12/16/2022 Target Resolution Date: 02/28/2023 Goal Status: Active Interventions: Assess ulceration(s) every visit Treatment Activities: Skin care regimen initiated : 12/16/2022 Notes: Electronic Signature(s) Signed: 01/19/2023 4:04:30 PM By: [name] Pulling RN, BSN Entered By: [name] on 01/19/2023 11:27:52 -------------------------------------------------------------------------------- Pain Assessment Details Patient Name: Date of Service: [name]. 01/19/2023 11:00 A M Medical Record Number: [id number] Patient Account Number: [id number] Date of Birth/Sex: Treating RN: 04/21/1945 (77 y.o. [name], [name] ([id number]) [id number] Page 5 of 7 Primary Care Anasha Perfecto: [name] Other Clinician: Referring Caycee Wanat: Treating Sharrell Krawiec/Extender: [name] in Treatment: 4 Active Problems Location of Pain Severity and Description of Pain Patient Has Paino No Site Locations Pain Management and Medication Current Pain Management: Electronic  Signature(s) Signed: 01/19/2023 1:49:04 PM By: [name] Entered By: [name] on 01/19/2023 11:07:58 -------------------------------------------------------------------------------- Patient/Caregiver Education Details Patient Name: Date of Service: [name]. 11/21/2024andnbsp11:00 A M Medical Record Number: [id number] Patient Account Number: [id number] Date of Birth/Gender: Treating RN: 1945/07/20 (77 y.o. [name] Primary Care Physician: [name] Other Clinician: Referring Physician: Treating Physician/Extender: [name] in Treatment:  4 Education Assessment Education Provided To: Patient Education Topics Provided Wound/Skin Impairment: Methods: Explain/Verbal Responses: State content correctly Electronic Signature(s) Signed: 01/19/2023 4:04:30 PM By: [name] Pulling RN, BSN Entered By: [name] on 01/19/2023 11:25:18 [name] ([id number]) [phone].pdf Page 6 of 7 -------------------------------------------------------------------------------- Wound Assessment Details Patient Name: Date of Service: [name], [name] MES [name]. 01/19/2023 11:00 A M Medical Record Number: [id number] Patient Account Number: [id number] Date of Birth/Sex: Treating RN: 01-07-1946 (77 y.o. M) Primary Care Zen Cedillos: [name] Other Clinician: Referring Saiquan Hands: Treating Christna Kulick/Extender: [name] in Treatment: 4 Wound Status Wound Number: 1 Primary Diabetic Wound/Ulcer of the Lower Extremity Etiology: Wound Location: Right, Medial, Plantar Foot Wound Open Wounding Event: Gradually Appeared Status: Date Acquired: 05/30/2022 Comorbid Cataracts, Arrhythmia, Congestive Heart Failure, Coronary Artery Weeks Of Treatment: 4 History: Disease, Hypertension, Type II Diabetes, Neuropathy Clustered Wound: No Photos Wound Measurements Length: (cm) 1 Width: (cm) 1 Depth: (cm) 0.2 Area: (cm) 0.785 Volume: (cm) 0.157 % Reduction in Area: 23.1% % Reduction in Volume: 48.7% Epithelialization: Small (1-33%) Wound Description Classification: Grade 2 Wound Margin: Thickened Exudate Amount: Medium Exudate Type: Serosanguineous Exudate Color: red, brown Foul Odor After Cleansing: No Slough/Fibrino Yes Wound Bed Granulation Amount: Large (67-100%) Exposed Structure Granulation Quality: Red, Pink Fascia Exposed: No Necrotic Amount: Small (1-33%) Fat Layer (Subcutaneous Tissue) Exposed: Yes Necrotic Quality: Adherent Slough Tendon Exposed: No Muscle Exposed: No Joint Exposed: No Bone  Exposed: No Periwound Skin Texture Texture Color No Abnormalities Noted: No No Abnormalities Noted: Yes Callus: Yes Temperature / Pain Temperature: No Abnormality Moisture No Abnormalities Noted: Yes Treatment Notes Wound #1 (Foot) Wound Laterality: Plantar, Right, Medial Cleanser Soap and Water [name], [name] ([id number]) [phone].pdf Page 7 of 7 Discharge Instruction: May shower and wash wound with dial antibacterial soap and water prior to dressing change. Vashe 5.8 (oz) Discharge Instruction: Or Cleanse the wound with Vashe prior to applying a clean dressing using gauze sponges, not tissue or cotton balls. Peri-Wound Care Zinc Oxide Ointment 30g tube Discharge Instruction: Apply Zinc Oxide to periwound with each dressing change Topical Gentamicin Discharge Instruction: As directed by physician Mupirocin Ointment Discharge Instruction: Apply Mupirocin (Bactroban) as instructed Primary Dressing Maxorb Extra Ag+ Alginate Dressing, 2x2 (in/in) Discharge Instruction: Apply to wound bed as instructed Secondary Dressing Zetuvit Plus Silicone Border Dressing 4x4 (in/in) Discharge Instruction: Apply silicone border over primary dressing as directed. Secured With Compression Wrap Compression Stockings Facilities manager) Signed: 01/19/2023 1:49:04 PM By: [name] Entered By: [name] on 01/19/2023 11:13:16 -------------------------------------------------------------------------------- Vitals Details Patient Name: Date of Service: [name]. 01/19/2023 11:00 A M Medical Record Number: [id number] Patient Account Number: [id number] Date of Birth/Sex: Treating RN: 08-Aug-1945 (77 y.o. M) Primary Care Patrica Mendell: [name] Other Clinician: Referring Maximina Pirozzi: Treating Allison Silva/Extender: [name] in Treatment: 4 Vital Signs Time Taken: 11:07 Temperature (F): 97.6 Height (in): 70 Pulse (bpm): 87 Weight  (lbs): 186 Respiratory Rate (breaths/min): 18 Body Mass Index (BMI): 26.7 Blood Pressure (mmHg): 91/61 Reference Range: 80 - 120 mg / dl Electronic Signature(s) Signed: 01/19/2023 1:49:04 PM By: Tenny Craw,  Aisha Entered ByDayton [name] on 01/19/2023 11:07:49

## 2023-01-19 NOTE — Progress Notes (Signed)
EMORI, GALAYDA ([id number]) [id number] Page 1 of 9 Visit Report for 01/19/2023 Chief Complaint Document Details Patient Name: Date of Service: [name], [name]. 01/19/2023 11:00 A M Medical Record Number: [id number] Patient Account Number: [id number] Date of Birth/Sex: Treating RN: 11/25/1945 (77 y.o. M) Primary Care Provider: Oliver [name] Other Clinician: Referring Provider: Treating Provider/Extender: Joeseph Amor in Treatment: 4 Information Obtained from: Patient Chief Complaint Patients presents for treatment of an open diabetic ulcer Electronic Signature(s) Signed: 01/19/2023 12:28:12 PM By: Duanne Guess MD FACS Entered By: Duanne Guess on 01/19/2023 12:28:12 -------------------------------------------------------------------------------- Debridement Details Patient Name: Date of Service: [name]. 01/19/2023 11:00 A M Medical Record Number: [id number] Patient Account Number: [id number] Date of Birth/Sex: Treating RN: 01-22-1946 (77 y.o. [name] Primary Care Provider: Oliver [name] Other Clinician: Referring Provider: Treating Provider/Extender: Joeseph Amor in Treatment: 4 Debridement Performed for Assessment: Wound #1 Right,Medial,Plantar Foot Performed By: Physician Duanne Guess, MD The following information was scribed by: Redmond Pulling The information was scribed for: Duanne Guess Debridement Type: Debridement Severity of Tissue Pre Debridement: Fat layer exposed Level of Consciousness (Pre-procedure): Awake and Alert Pre-procedure Verification/Time Out Yes - 11:33 Taken: Start Time: 11:33 Pain Control: Lidocaine 4% Topical Solution Percent of Wound Bed Debrided: 100% T Area Debrided (cm): otal 0.78 Tissue and other material debrided: Non-Viable, Callus, Slough, Subcutaneous, Skin: Dermis , Skin: Epidermis, Slough Level: Skin/Subcutaneous Tissue Debridement  Description: Excisional Instrument: Curette Bleeding: Minimum Hemostasis Achieved: Pressure Response to Treatment: Procedure was tolerated well Level of Consciousness (Post- Awake and Alert procedure): Post Debridement Measurements of Total Wound Length: (cm) 1 Width: (cm) 1 Depth: (cm) 0.2 Volume: (cm) 0.157 Character of Wound/Ulcer Post Debridement: Improved Severity of Tissue Post Debridement: Fat layer exposed [name], [name] ([id number]) [id number].pdf Page 2 of 9 Post Procedure Diagnosis Same as Pre-procedure Electronic Signature(s) Signed: 01/19/2023 11:44:55 AM By: Duanne Guess MD FACS Signed: 01/19/2023 4:04:30 PM By: Redmond Pulling RN, BSN Entered By: Redmond Pulling on 01/19/2023 11:33:59 -------------------------------------------------------------------------------- HPI Details Patient Name: Date of Service: [name]. 01/19/2023 11:00 A M Medical Record Number: [id number] Patient Account Number: [id number] Date of Birth/Sex: Treating RN: [birth year] (77 y.o. M) Primary Care Provider: Oliver [name] Other Clinician: Referring Provider: Treating Provider/Extender: Joeseph Amor in Treatment: 4 History of Present Illness HPI Description: ADMISSION 12/16/2022 ***ABIs [name]: 0.95; R: 0.88*** This is a 76 year old type II diabetic (last hemoglobin A1c 7.2% on November 20, 2022) with a fairly extensive cardiac and peripheral vascular history. 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 Most recently, he has been followed by a podiatrist in  for right and left diabetic foot ulcers on the first metatarsal head. He says they were at first applying some kind of cream, the name of which he does not remember and have now been applying what he describes as a "some white stuff." Due to failure of the wounds to heal, he was referred to the wound care center for further evaluation and  management. 12/29/2022: The left foot wound is healed. The right first metatarsal head wound is surrounded with macerated callus and skin. There is some slough on the surface. 01/12/2023: The tissue maceration is much better this week. There is an odor coming from the wound, however. The wound is stable in size. 01/19/2023: The culture that I took last week grew out multiple species, including Enterococcus, strep, staph, and small amounts of Pseudomonas. I prescribed Augmentin and  he is currently taking this. The wound itself is looking a little bit better. Less maceration and a healthier appearing surface. The odor has abated. Electronic Signature(s) Signed: 01/19/2023 12:35:55 PM By: Duanne Guess MD FACS Entered By: Duanne Guess on 01/19/2023 12:35:55 -------------------------------------------------------------------------------- Physical Exam Details Patient Name: Date of Service: [name]. 01/19/2023 11:00 A M Medical Record Number: [id number] Patient Account Number: [id number] Date of Birth/Sex: Treating RN: 06/27/1945 (77 y.o. M) Primary Care Provider: Oliver [name] Other Clinician: Referring Provider: Treating Provider/Extender: Joeseph Amor in Treatment: 4 Constitutional Hypotensive, but normal for this patient. . . . no acute distress. Respiratory . Notes 01/19/2023: The wound is looking a little bit better. Less maceration and a healthier appearing surface. The odor has abated. [name], [name] ([id number]) [id number] Page 3 of 9 Electronic Signature(s) Signed: 01/19/2023 12:37:05 PM By: Duanne Guess MD FACS Entered By: Duanne Guess on 01/19/2023 12:37:05 -------------------------------------------------------------------------------- Physician Orders Details Patient Name: Date of Service: [name]. 01/19/2023 11:00 A M Medical Record Number: [id number] Patient Account Number: [id number] Date of  Birth/Sex: Treating RN: 04/09/1945 (77 y.o. [name] Primary Care Provider: Oliver [name] Other Clinician: Referring Provider: Treating Provider/Extender: Joeseph Amor in Treatment: 4 Verbal / Phone Orders: No Diagnosis Coding ICD-10 Coding Code Description [phone] Non-pressure chronic ulcer of other part of right foot with fat layer exposed E11.621 Type 2 diabetes mellitus with foot ulcer I73.9 Peripheral vascular disease, unspecified E11.49 Type 2 diabetes mellitus with other diabetic neurological complication L84 Corns and callosities Follow-up Appointments ppointment in 1 week. - Dr Lady Gary 01/24/23 @ 1100 Return A ppointment in 2 weeks. - Dr. Lady Gary Room 3 (please schedule) Return A Anesthetic (In clinic) Topical Lidocaine 4% applied to wound bed Bathing/ Shower/ Hygiene May shower and wash wound with soap and water. - After showering/Bathing, if you choose, you may perform the wound dressing changes Off-Loading Wound #1 Right,Medial,Plantar Foot Wedge shoe to: - right foot - wear when walking, try to keep pressure off wound area at all times Wound Treatment Wound #1 - Foot Wound Laterality: Plantar, Right, Medial Cleanser: Soap and Water 1 x Per Day/15 Days Discharge Instructions: May shower and wash wound with dial antibacterial soap and water prior to dressing change. Cleanser: Vashe 5.8 (oz) 1 x Per Day/15 Days Discharge Instructions: Or Cleanse the wound with Vashe prior to applying a clean dressing using gauze sponges, not tissue or cotton balls. Peri-Wound Care: Zinc Oxide Ointment 30g tube 1 x Per Day/15 Days Discharge Instructions: Apply Zinc Oxide to periwound with each dressing change Topical: Gentamicin 1 x Per Day/15 Days Discharge Instructions: As directed by physician Topical: Mupirocin Ointment 1 x Per Day/15 Days Discharge Instructions: Apply Mupirocin (Bactroban) as instructed Prim Dressing: Maxorb Extra Ag+ Alginate Dressing,  2x2 (in/in) (Generic) 1 x Per Day/15 Days ary Discharge Instructions: Apply to wound bed as instructed Secondary Dressing: Zetuvit Plus Silicone Border Dressing 4x4 (in/in) (DME) (Dispense As Written) 1 x Per Day/15 Days Discharge Instructions: Apply silicone border over primary dressing as directed. Patient Medications llergies: Adin Hector Antibiotics A SEABRON, DILLIN ([id number]) [phone].pdf Page 4 of 9 Notifications Medication Indication Start End 01/19/2023 lidocaine DOSE topical 4 % cream - cream topical once daily Electronic Signature(s) Signed: 01/19/2023 3:24:50 PM By: Duanne Guess MD FACS Previous Signature: 01/19/2023 11:44:55 AM Version By: Duanne Guess MD FACS Entered By: Duanne Guess on 01/19/2023 12:38:17 -------------------------------------------------------------------------------- Problem List Details Patient Name: Date of Service: [name]  [name]. 01/19/2023 11:00 A M Medical Record Number: [id number] Patient Account Number: [id number] Date of Birth/Sex: Treating RN: April 11, 1945 (77 y.o. M) Primary Care Provider: Oliver [name] Other Clinician: Referring Provider: Treating Provider/Extender: Joeseph Amor in Treatment: 4 Active Problems ICD-10 Encounter Code Description Active Date MDM Diagnosis L97.512 Non-pressure chronic ulcer of other part of right foot with fat layer exposed 12/16/2022 No Yes E11.621 Type 2 diabetes mellitus with foot ulcer 12/16/2022 No Yes I73.9 Peripheral vascular disease, unspecified 12/16/2022 No Yes E11.49 Type 2 diabetes mellitus with other diabetic neurological complication 12/16/2022 No Yes L84 Corns and callosities 12/16/2022 No Yes Inactive Problems Resolved Problems ICD-10 Code Description Active Date Resolved Date L97.521 Non-pressure chronic ulcer of other part of left foot limited to breakdown of skin 12/16/2022 12/16/2022 Electronic Signature(s) Signed:  01/19/2023 12:27:35 PM By: Duanne Guess MD FACS Entered By: Duanne Guess on 01/19/2023 12:27:35 Kozikowski, [name] ([id number]) [id number] Page 5 of 9 -------------------------------------------------------------------------------- Progress Note Details Patient Name: Date of Service: [name], [name]. 01/19/2023 11:00 A M Medical Record Number: [id number] Patient Account Number: [id number] Date of Birth/Sex: Treating RN: 04/07/1945 (77 y.o. M) Primary Care Provider: Oliver [name] Other Clinician: Referring Provider: Treating Provider/Extender: Joeseph Amor in Treatment: 4 Subjective Chief Complaint Information obtained from Patient Patients presents for treatment of an open diabetic ulcer History of Present Illness (HPI) ADMISSION 12/16/2022 ***ABIs [name]: 0.95; R: 0.88*** This is a 77 year old type II diabetic (last hemoglobin A1c 7.2% on November 20, 2022) with a fairly extensive cardiac and peripheral vascular history. 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 Most recently, he has been followed by a podiatrist in [place] for right and left diabetic foot ulcers on the first metatarsal head. He says they were at first applying some kind of cream, the name of which he does not remember and have now been applying what he describes as a "some white stuff." Due to failure of the wounds to heal, he was referred to the wound care center for further evaluation and management. 12/29/2022: The left foot wound is healed. The right first metatarsal head wound is surrounded with macerated callus and skin. There is some slough on the surface. 01/12/2023: The tissue maceration is much better this week. There is an odor coming from the wound, however. The wound is stable in size. 01/19/2023: The culture that I took last week grew out multiple species, including Enterococcus, strep, staph, and small amounts of  Pseudomonas. I prescribed Augmentin and he is currently taking this. The wound itself is looking a little bit better. Less maceration and a healthier appearing surface. The odor has abated. Patient History Information obtained from Patient, Chart. Family History Unknown History. Social History Never smoker, Marital Status - Divorced, Alcohol Use - Never, Drug Use - No History, Caffeine Use - Never. Medical History Eyes Patient has history of Cataracts - Cataract Extraction Cardiovascular Patient has history of Arrhythmia - A.Fib;V.Fib, Congestive Heart Failure - Chronic Systolic HF, Coronary Artery Disease, Hypertension Endocrine Patient has history of Type II Diabetes Neurologic Patient has history of Neuropathy - Diabetic Peripheral Neuropathy Hospitalization/Surgery History - Cataract Extraction. - Implanted Cardiac Defibrillator. Medical A Surgical History Notes nd Respiratory Allergic Rhinitis Acute Bronchitis Pharyngitis Cardiovascular 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 Hyperlipidemia Implantable Cardioverter-Defibrillator (ICD) in situ Endocrine Last Hem A1C 7.2% on 11/21/22 Hypogonadism, Male Genitourinary Nephrolithiasis Integumentary (Skin) Neoplasm of uncertain behavior of skin Callus Right foot Skin Lesion Psychiatric General  anxiety Objective [name], [name] ([id number]) [id number] Page 6 of 9 Constitutional Hypotensive, but normal for this patient. no acute distress. Vitals Time Taken: 11:07 AM, Height: 70 in, Weight: 186 lbs, BMI: 26.7, Temperature: 97.6 F, Pulse: 87 bpm, Respiratory Rate: 18 breaths/min, Blood Pressure: 91/61 mmHg. General Notes: 01/19/2023: The wound is looking a little bit better. Less maceration and a healthier appearing surface. The odor has abated. Integumentary (Hair, Skin) Wound #1 status is Open. Original cause of wound was Gradually Appeared. The date acquired  was: 05/30/2022. The wound has been in treatment 4 weeks. The wound is located on the Right,Medial,Plantar Foot. The wound measures 1cm length x 1cm width x 0.2cm depth; 0.785cm^2 area and 0.157cm^3 volume. There is Fat Layer (Subcutaneous Tissue) exposed. There is a medium amount of serosanguineous drainage noted. The wound margin is thickened. There is large (67- 100%) red, pink granulation within the wound bed. There is a small (1-33%) amount of necrotic tissue within the wound bed including Adherent Slough. The periwound skin appearance had no abnormalities noted for moisture. The periwound skin appearance had no abnormalities noted for color. The periwound skin appearance exhibited: Callus. Periwound temperature was noted as No Abnormality. Assessment Active Problems ICD-10 Non-pressure chronic ulcer of other part of right foot with fat layer exposed Type 2 diabetes mellitus with foot ulcer Peripheral vascular disease, unspecified Type 2 diabetes mellitus with other diabetic neurological complication Corns and callosities Procedures Wound #1 Pre-procedure diagnosis of Wound #1 is a Diabetic Wound/Ulcer of the Lower Extremity located on the Right,Medial,Plantar Foot .Severity of Tissue Pre Debridement is: Fat layer exposed. There was a Excisional Skin/Subcutaneous Tissue Debridement with a total area of 0.78 sq cm performed by Duanne Guess, MD. With the following instrument(s): Curette to remove Non-Viable tissue/material. Material removed includes Callus, Subcutaneous Tissue, Slough, Skin: Dermis, and Skin: Epidermis after achieving pain control using Lidocaine 4% Topical Solution. No specimens were taken. A time out was conducted at 11:33, prior to the start of the procedure. A Minimum amount of bleeding was controlled with Pressure. The procedure was tolerated well. Post Debridement Measurements: 1cm length x 1cm width x 0.2cm depth; 0.157cm^3 volume. Character of Wound/Ulcer Post  Debridement is improved. Severity of Tissue Post Debridement is: Fat layer exposed. Post procedure Diagnosis Wound #1: Same as Pre-Procedure Plan Follow-up Appointments: Return Appointment in 1 week. - Dr Lady Gary 01/24/23 @ 1100 Return Appointment in 2 weeks. - Dr. Lady Gary Room 3 (please schedule) Anesthetic: (In clinic) Topical Lidocaine 4% applied to wound bed Bathing/ Shower/ Hygiene: May shower and wash wound with soap and water. - After showering/Bathing, if you choose, you may perform the wound dressing changes Off-Loading: Wound #1 Right,Medial,Plantar Foot: Wedge shoe to: - right foot - wear when walking, try to keep pressure off wound area at all times The following medication(s) was prescribed: lidocaine topical 4 % cream cream topical once daily was prescribed at facility WOUND #1: - Foot Wound Laterality: Plantar, Right, Medial Cleanser: Soap and Water 1 x Per Day/15 Days Discharge Instructions: May shower and wash wound with dial antibacterial soap and water prior to dressing change. Cleanser: Vashe 5.8 (oz) 1 x Per Day/15 Days Discharge Instructions: Or Cleanse the wound with Vashe prior to applying a clean dressing using gauze sponges, not tissue or cotton balls. Peri-Wound Care: Zinc Oxide Ointment 30g tube 1 x Per Day/15 Days Discharge Instructions: Apply Zinc Oxide to periwound with each dressing change Topical: Gentamicin 1 x Per Day/15 Days Discharge Instructions: As directed by  physician Topical: Mupirocin Ointment 1 x Per Day/15 Days Discharge Instructions: Apply Mupirocin (Bactroban) as instructed Prim Dressing: Maxorb Extra Ag+ Alginate Dressing, 2x2 (in/in) (Generic) 1 x Per Day/15 Days ary Discharge Instructions: Apply to wound bed as instructed Secondary Dressing: Zetuvit Plus Silicone Border Dressing 4x4 (in/in) (DME) (Dispense As Written) 1 x Per Day/15 Days Discharge Instructions: Apply silicone border over primary dressing as directed. [name], [name]  ([id number]) [id number] Page 7 of 9 01/19/2023: The culture that I took last week grew out multiple species, including Enterococcus, strep, staph, and small amounts of Pseudomonas. I prescribed Augmentin and he is currently taking this. The wound itself is looking a little bit better. Less maceration and a healthier appearing surface. The odor has abated. I used a curette to debride callus, skin, slough, and subcutaneous tissue from his wound. We will continue the mixture of topical gentamicin and mupirocin with silver alginate. Continue forefoot offloading shoe. He will complete the course of oral Augmentin. Follow-up in 1 week. Electronic Signature(s) Signed: 01/19/2023 12:39:16 PM By: Duanne Guess MD FACS Entered By: Duanne Guess on 01/19/2023 12:39:16 -------------------------------------------------------------------------------- HxROS Details Patient Name: Date of Service: [name]. 01/19/2023 11:00 A M Medical Record Number: [id number] Patient Account Number: [id number] Date of Birth/Sex: Treating RN: February 07, 1946 (77 y.o. M) Primary Care Provider: Oliver [name] Other Clinician: Referring Provider: Treating Provider/Extender: Joeseph Amor in Treatment: 4 Information Obtained From Patient Chart Eyes Medical History: Positive for: Cataracts - Cataract Extraction Respiratory Medical History: Past Medical History Notes: Allergic Rhinitis Acute Bronchitis Pharyngitis Cardiovascular Medical History: Positive for: Arrhythmia - A.Fib;V.Fib; Congestive Heart Failure - Chronic Systolic HF; Coronary Artery Disease; Hypertension Past Medical History Notes: 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 Hyperlipidemia Implantable Cardioverter-Defibrillator (ICD) in situ Endocrine Medical History: Positive for: Type II Diabetes Past Medical History Notes: Last Hem A1C 7.2% on  11/21/22 Hypogonadism, Male Time with diabetes: 6 years Treated with: Insulin Genitourinary Medical History: Past Medical History Notes: Nephrolithiasis Integumentary (Skin) Medical History: Past Medical History Notes: Neoplasm of uncertain behavior of skin Callus Right foot Skin Lesion [name], [name] ([id number]) [phone].pdf Page 8 of 9 Neurologic Medical History: Positive for: Neuropathy - Diabetic Peripheral Neuropathy Psychiatric Medical History: Past Medical History Notes: General anxiety HBO Extended History Items Eyes: Cataracts Immunizations Pneumococcal Vaccine: Received Pneumococcal Vaccination: Yes Received Pneumococcal Vaccination On or After 60th Birthday: Yes Implantable Devices Yes Hospitalization / Surgery History Type of Hospitalization/Surgery Cataract Extraction Implanted Cardiac Defibrillator Family and Social History Unknown History: Yes; Never smoker; Marital Status - Divorced; Alcohol Use: Never; Drug Use: No History; Caffeine Use: Never; Financial Concerns: No; Food, Clothing or Shelter Needs: No; Support System Lacking: No; Transportation Concerns: No Electronic Signature(s) Signed: 01/19/2023 3:24:50 PM By: Duanne Guess MD FACS Entered By: Duanne Guess on 01/19/2023 12:36:30 -------------------------------------------------------------------------------- SuperBill Details Patient Name: Date of Service: [name]. 01/19/2023 Medical Record Number: [id number] Patient Account Number: [id number] Date of Birth/Sex: Treating RN: [birth year] (77 y.o. M) Primary Care Provider: Oliver [name] Other Clinician: Referring Provider: Treating Provider/Extender: Joeseph Amor in Treatment: 4 Diagnosis Coding ICD-10 Codes Code Description [phone] Non-pressure chronic ulcer of other part of right foot with fat layer exposed E11.621 Type 2 diabetes mellitus with foot ulcer I73.9 Peripheral  vascular disease, unspecified E11.49 Type 2 diabetes mellitus with other diabetic neurological complication L84 Corns and callosities Facility Procedures : [name], [name] Code: [id number] [name] ([id number]) Description: 11042 - DEB SUBQ TISSUE 20 SQ CM/<  ICD-10 Diagnosis Description L97.512 Non-pressure chronic ulcer of other part of right foot with fat layer exposed [id number] Modifier: [id number]_51 Quantity: 1 227.pdf Page 9 of 9 Physician Procedures : CPT4 Code Description Modifier [id number] - WC PHYS LEVEL 4 - EST PT ICD-10 Diagnosis Description L97.512 Non-pressure chronic ulcer of other part of right foot with fat layer exposed E11.621 Type 2 diabetes mellitus with foot ulcer I73.9 Peripheral  vascular disease, unspecified E11.49 Type 2 diabetes mellitus with other diabetic neurological complication Quantity: 1 : [id number] 11042 - WC PHYS SUBQ TISS 20 SQ CM ICD-10 Diagnosis Description L97.512 Non-pressure chronic ulcer of other part of right foot with fat layer exposed Quantity: 1 Electronic Signature(s) Signed: 01/19/2023 12:39:52 PM By: Duanne Guess MD FACS Entered By: Duanne Guess on 01/19/2023 12:39:52

## 2023-01-20 DIAGNOSIS — I5042 Chronic combined systolic (congestive) and diastolic (congestive) heart failure: Secondary | ICD-10-CM | POA: Diagnosis not present

## 2023-01-23 DIAGNOSIS — S91301A Unspecified open wound, right foot, initial encounter: Secondary | ICD-10-CM | POA: Diagnosis not present

## 2023-01-24 ENCOUNTER — Encounter (HOSPITAL_BASED_OUTPATIENT_CLINIC_OR_DEPARTMENT_OTHER): Payer: Medicare Other | Admitting: General Surgery

## 2023-01-24 DIAGNOSIS — E1151 Type 2 diabetes mellitus with diabetic peripheral angiopathy without gangrene: Secondary | ICD-10-CM | POA: Diagnosis not present

## 2023-01-24 DIAGNOSIS — B955 Unspecified streptococcus as the cause of diseases classified elsewhere: Secondary | ICD-10-CM | POA: Diagnosis not present

## 2023-01-24 DIAGNOSIS — L84 Corns and callosities: Secondary | ICD-10-CM | POA: Diagnosis not present

## 2023-01-24 DIAGNOSIS — E1142 Type 2 diabetes mellitus with diabetic polyneuropathy: Secondary | ICD-10-CM | POA: Diagnosis not present

## 2023-01-24 DIAGNOSIS — L97512 Non-pressure chronic ulcer of other part of right foot with fat layer exposed: Secondary | ICD-10-CM | POA: Diagnosis not present

## 2023-01-24 DIAGNOSIS — I5022 Chronic systolic (congestive) heart failure: Secondary | ICD-10-CM | POA: Diagnosis not present

## 2023-01-24 DIAGNOSIS — B965 Pseudomonas (aeruginosa) (mallei) (pseudomallei) as the cause of diseases classified elsewhere: Secondary | ICD-10-CM | POA: Diagnosis not present

## 2023-01-24 DIAGNOSIS — B958 Unspecified staphylococcus as the cause of diseases classified elsewhere: Secondary | ICD-10-CM | POA: Diagnosis not present

## 2023-01-24 DIAGNOSIS — E11621 Type 2 diabetes mellitus with foot ulcer: Secondary | ICD-10-CM | POA: Diagnosis not present

## 2023-01-24 DIAGNOSIS — B952 Enterococcus as the cause of diseases classified elsewhere: Secondary | ICD-10-CM | POA: Diagnosis not present

## 2023-01-24 DIAGNOSIS — I110 Hypertensive heart disease with heart failure: Secondary | ICD-10-CM | POA: Diagnosis not present

## 2023-01-24 DIAGNOSIS — E1149 Type 2 diabetes mellitus with other diabetic neurological complication: Secondary | ICD-10-CM | POA: Diagnosis not present

## 2023-01-24 DIAGNOSIS — Z89431 Acquired absence of right foot: Secondary | ICD-10-CM | POA: Diagnosis not present

## 2023-01-25 NOTE — Progress Notes (Signed)
ROXAS, SIGERS ([id number]) [id number] Page 1 of 9 Visit Report for 01/24/2023 Chief Complaint Document Details Patient Name: Date of Service: [name], [name] MES [name]. 01/24/2023 11:00 A M Medical Record Number: [id number] Patient Account Number: [id number] Date of Birth/Sex: Treating RN: 08/22/1945 (77 y.o. M) Primary Care Provider: Oliver Barre Other Clinician: Referring Provider: Treating Provider/Extender: Joeseph Amor in Treatment: 5 Information Obtained from: Patient Chief Complaint Patients presents for treatment of an open diabetic ulcer Electronic Signature(s) Signed: 01/24/2023 12:13:38 PM By: Duanne Guess MD FACS Entered By: Duanne Guess on 01/24/2023 12:13:38 -------------------------------------------------------------------------------- Debridement Details Patient Name: Date of Service: [name]. 01/24/2023 11:00 A M Medical Record Number: [id number] Patient Account Number: [id number] Date of Birth/Sex: Treating RN: 02-15-1946 (77 y.o. [name] Primary Care Provider: Oliver Barre Other Clinician: Referring Provider: Treating Provider/Extender: Joeseph Amor in Treatment: 5 Debridement Performed for Assessment: Wound #1 Right,Medial,Plantar Foot Performed By: Physician Duanne Guess, MD The following information was scribed by: Karie Schwalbe The information was scribed for: Duanne Guess Debridement Type: Debridement Severity of Tissue Pre Debridement: Fat layer exposed Level of Consciousness (Pre-procedure): Awake and Alert Pre-procedure Verification/Time Out Yes - 11:50 Taken: Start Time: 11:50 Percent of Wound Bed Debrided: 100% T Area Debrided (cm): otal 0.71 Tissue and other material debrided: Non-Viable, Callus, Slough, Subcutaneous, Skin: Dermis , Skin: Epidermis, Slough Level: Skin/Subcutaneous Tissue Debridement Description: Excisional Instrument:  Curette Bleeding: Minimum Hemostasis Achieved: Pressure Response to Treatment: Procedure was tolerated well Level of Consciousness (Post- Awake and Alert procedure): Post Debridement Measurements of Total Wound Length: (cm) 1 Width: (cm) 0.9 Depth: (cm) 0.3 Volume: (cm) 0.212 Character of Wound/Ulcer Post Debridement: Improved Severity of Tissue Post Debridement: Fat layer exposed [name], [name] ([id number]) [phone].pdf Page 2 of 9 Post Procedure Diagnosis Same as Pre-procedure Electronic Signature(s) Signed: 01/24/2023 1:54:30 PM By: Duanne Guess MD FACS Signed: 01/24/2023 5:43:58 PM By: Karie Schwalbe RN Entered By: Karie Schwalbe on 01/24/2023 11:59:54 -------------------------------------------------------------------------------- HPI Details Patient Name: Date of Service: [name]. 01/24/2023 11:00 A M Medical Record Number: [id number] Patient Account Number: [id number] Date of Birth/Sex: Treating RN: [birth year] (77 y.o. M) Primary Care Provider: Oliver Barre Other Clinician: Referring Provider: Treating Provider/Extender: Joeseph Amor in Treatment: 5 History of Present Illness HPI Description: ADMISSION 12/16/2022 ***ABIs [name]: 0.95; R: 0.88*** This is a 77 year old type II diabetic (last hemoglobin A1c 7.2% on November 20, 2022) with a fairly extensive cardiac and peripheral vascular history. 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 Most recently, he has been followed by a podiatrist in [place] for right and left diabetic foot ulcers on the first metatarsal head. He says they were at first applying some kind of cream, the name of which he does not remember and have now been applying what he describes as a "some white stuff." Due to failure of the wounds to heal, he was referred to the wound care center for further evaluation and management. 12/29/2022: The left foot wound  is healed. The right first metatarsal head wound is surrounded with macerated callus and skin. There is some slough on the surface. 01/12/2023: The tissue maceration is much better this week. There is an odor coming from the wound, however. The wound is stable in size. 01/19/2023: The culture that I took last week grew out multiple species, including Enterococcus, strep, staph, and small amounts of Pseudomonas. I prescribed Augmentin and he is currently taking this. The wound  itself is looking a little bit better. Less maceration and a healthier appearing surface. The odor has abated. 01/24/2023: The wound is about the same size today, but the overall appearance is improved. There is no tissue maceration. The surface has a layer of slough overlying the granulation tissue. No malodor or purulent drainage. Electronic Signature(s) Signed: 01/24/2023 12:14:49 PM By: Duanne Guess MD FACS Entered By: Duanne Guess on 01/24/2023 12:14:49 -------------------------------------------------------------------------------- Physical Exam Details Patient Name: Date of Service: [name]. 01/24/2023 11:00 A M Medical Record Number: [id number] Patient Account Number: [id number] Date of Birth/Sex: Treating RN: May 20, 1945 (77 y.o. M) Primary Care Provider: Oliver Barre Other Clinician: Referring Provider: Treating Provider/Extender: Joeseph Amor in Treatment: 5 Constitutional Hypotensive, but normal for this patient. . . . no acute distress. Respiratory Normal work of breathing on room air.Marland Kitchen [name], [name] ([id number]) [id number] Page 3 of 9 Notes 01/24/2023: The wound is about the same size today, but the overall appearance is improved. There is no tissue maceration. The surface has a layer of slough overlying the granulation tissue. No malodor or purulent drainage. Electronic Signature(s) Signed: 01/24/2023 12:15:20 PM By: Duanne Guess MD  FACS Entered By: Duanne Guess on 01/24/2023 12:15:20 -------------------------------------------------------------------------------- Physician Orders Details Patient Name: Date of Service: [name]. 01/24/2023 11:00 A M Medical Record Number: [id number] Patient Account Number: [id number] Date of Birth/Sex: Treating RN: 1945/07/30 (77 y.o. [name] Primary Care Provider: Oliver Barre Other Clinician: Referring Provider: Treating Provider/Extender: Joeseph Amor in Treatment: 5 Verbal / Phone Orders: No Diagnosis Coding ICD-10 Coding Code Description [id number] Non-pressure chronic ulcer of other part of right foot with fat layer exposed E11.621 Type 2 diabetes mellitus with foot ulcer I73.9 Peripheral vascular disease, unspecified E11.49 Type 2 diabetes mellitus with other diabetic neurological complication L84 Corns and callosities Follow-up Appointments ppointment in 1 week. - Dr Lady Gary 02/02/23 @ 3:45pm Return A ppointment in 2 weeks. - Dr. Lady Gary Room 3 (please schedule) Return A Anesthetic (In clinic) Topical Lidocaine 4% applied to wound bed Bathing/ Shower/ Hygiene May shower and wash wound with soap and water. - After showering/Bathing, if you choose, you may perform the wound dressing changes Off-Loading Wound #1 Right,Medial,Plantar Foot Wedge shoe to: - right foot - wear when walking, try to keep pressure off wound area at all times Wound Treatment Wound #1 - Foot Wound Laterality: Plantar, Right, Medial Cleanser: Soap and Water 1 x Per Day/15 Days Discharge Instructions: May shower and wash wound with dial antibacterial soap and water prior to dressing change. Cleanser: Vashe 5.8 (oz) 1 x Per Day/15 Days Discharge Instructions: Or Cleanse the wound with Vashe prior to applying a clean dressing using gauze sponges, not tissue or cotton balls. Peri-Wound Care: Zinc Oxide Ointment 30g tube 1 x Per Day/15 Days Discharge  Instructions: Apply Zinc Oxide to periwound with each dressing change Topical: Gentamicin 1 x Per Day/15 Days Discharge Instructions: As directed by physician Topical: Mupirocin Ointment 1 x Per Day/15 Days Discharge Instructions: Apply Mupirocin (Bactroban) as instructed Prim Dressing: Maxorb Extra Ag+ Alginate Dressing, 2x2 (in/in) (Generic) 1 x Per Day/15 Days ary Discharge Instructions: Apply to wound bed as instructed Secondary Dressing: Zetuvit Plus Silicone Border Dressing 4x4 (in/in) (Dispense As Written) 1 x Per Day/15 Days Discharge Instructions: Apply silicone border over primary dressing as directed. [name], [name] ([id number]) [id number] Page 4 of 9 Electronic Signature(s) Signed: 01/24/2023 1:54:30 PM By: Duanne Guess MD FACS Entered By: Duanne Guess  on 01/24/2023 12:17:37 -------------------------------------------------------------------------------- Problem List Details Patient Name: Date of Service: [name], [name] MES [name]. 01/24/2023 11:00 A M Medical Record Number: [id number] Patient Account Number: [id number] Date of Birth/Sex: Treating RN: 22-Jul-1945 (77 y.o. M) Primary Care Provider: Oliver Barre Other Clinician: Referring Provider: Treating Provider/Extender: Joeseph Amor in Treatment: 5 Active Problems ICD-10 Encounter Code Description Active Date MDM Diagnosis L97.512 Non-pressure chronic ulcer of other part of right foot with fat layer exposed 12/16/2022 No Yes E11.621 Type 2 diabetes mellitus with foot ulcer 12/16/2022 No Yes I73.9 Peripheral vascular disease, unspecified 12/16/2022 No Yes E11.49 Type 2 diabetes mellitus with other diabetic neurological complication 12/16/2022 No Yes L84 Corns and callosities 12/16/2022 No Yes Inactive Problems Resolved Problems ICD-10 Code Description Active Date Resolved Date L97.521 Non-pressure chronic ulcer of other part of left foot limited to breakdown of skin  12/16/2022 12/16/2022 Electronic Signature(s) Signed: 01/24/2023 12:12:05 PM By: Duanne Guess MD FACS Entered By: Duanne Guess on 01/24/2023 12:12:05 -------------------------------------------------------------------------------- Progress Note Details Patient Name: Date of Service: [name]. 01/24/2023 11:00 A M Medical Record Number: [id number] Patient Account Number: [id number] [name], [name] ([id number]) [phone].pdf Page 5 of 9 Date of Birth/Sex: Treating RN: Sep 17, 1945 (77 y.o. M) Primary Care Provider: Other Clinician: Oliver Barre Referring Provider: Treating Provider/Extender: Joeseph Amor in Treatment: 5 Subjective Chief Complaint Information obtained from Patient Patients presents for treatment of an open diabetic ulcer History of Present Illness (HPI) ADMISSION 12/16/2022 ***ABIs [name]: 0.95; R: 0.88*** This is a 77 year old type II diabetic (last hemoglobin A1c 7.2% on November 20, 2022) with a fairly extensive cardiac and peripheral vascular history. 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 Most recently, he has been followed by a podiatrist in [place] for right and left diabetic foot ulcers on the first metatarsal head. He says they were at first applying some kind of cream, the name of which he does not remember and have now been applying what he describes as a "some white stuff." Due to failure of the wounds to heal, he was referred to the wound care center for further evaluation and management. 12/29/2022: The left foot wound is healed. The right first metatarsal head wound is surrounded with macerated callus and skin. There is some slough on the surface. 01/12/2023: The tissue maceration is much better this week. There is an odor coming from the wound, however. The wound is stable in size. 01/19/2023: The culture that I took last week grew out multiple species,  including Enterococcus, strep, staph, and small amounts of Pseudomonas. I prescribed Augmentin and he is currently taking this. The wound itself is looking a little bit better. Less maceration and a healthier appearing surface. The odor has abated. 01/24/2023: The wound is about the same size today, but the overall appearance is improved. There is no tissue maceration. The surface has a layer of slough overlying the granulation tissue. No malodor or purulent drainage. Patient History Information obtained from Patient, Chart. Family History Unknown History. Social History Never smoker, Marital Status - Divorced, Alcohol Use - Never, Drug Use - No History, Caffeine Use - Never. Medical History Eyes Patient has history of Cataracts - Cataract Extraction Cardiovascular Patient has history of Arrhythmia - A.Fib;V.Fib, Congestive Heart Failure - Chronic Systolic HF, Coronary Artery Disease, Hypertension Endocrine Patient has history of Type II Diabetes Neurologic Patient has history of Neuropathy - Diabetic Peripheral Neuropathy Hospitalization/Surgery History - Cataract Extraction. - Implanted Cardiac Defibrillator. 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 no acute distress. Vitals Time Taken: 11:50 AM, Height: 70 in, Weight: 186 lbs, BMI: 26.7, Temperature: 98.4 F, Pulse: 90 bpm, Respiratory Rate: 16 breaths/min, Blood Pressure: 90/62 mmHg. Respiratory [name], [name] ([id number])  [id number] Page 6 of 9 Normal work of breathing on room air.. General Notes: 01/24/2023: The wound is about the same size today, but the overall appearance is improved. There is no tissue maceration. The surface has a layer of slough overlying the granulation tissue. No malodor or purulent drainage. Integumentary (Hair, Skin) Wound #1 status is Open. Original cause of wound was Gradually Appeared. The date acquired was: 05/30/2022. The wound has been in treatment 5 weeks. The wound is located on the Right,Medial,Plantar Foot. The wound measures 1cm length x 0.9cm width x 0.3cm depth; 0.707cm^2 area and 0.212cm^3 volume. There is Fat Layer (Subcutaneous Tissue) exposed. There is no tunneling noted, however, there is undermining starting at 6:00 and ending at 3:00 with a maximum distance of 0.3cm. There is a medium amount of serosanguineous drainage noted. The wound margin is thickened. There is large (67-100%) red, pink granulation within the wound bed. There is a small (1-33%) amount of necrotic tissue within the wound bed including Adherent Slough. The periwound skin appearance had no abnormalities noted for moisture. The periwound skin appearance had no abnormalities noted for color. The periwound skin appearance exhibited: Callus. Periwound temperature was noted as No Abnormality. Assessment Active Problems ICD-10 Non-pressure chronic ulcer of other part of right foot with fat layer exposed Type 2 diabetes mellitus with foot ulcer Peripheral vascular disease, unspecified Type 2 diabetes mellitus with other diabetic neurological complication Corns and callosities Procedures Wound #1 Pre-procedure diagnosis of Wound #1 is a Diabetic Wound/Ulcer of the Lower Extremity located on the Right,Medial,Plantar Foot .Severity of Tissue Pre Debridement is: Fat layer exposed. There was a Excisional Skin/Subcutaneous Tissue Debridement with a total area of 0.71 sq cm performed  by Duanne Guess, MD. With the following instrument(s): Curette to remove Non-Viable tissue/material. Material removed includes Callus, Subcutaneous Tissue, Slough, Skin: Dermis, and Skin: Epidermis. No specimens were taken. A time out was conducted at 11:50, prior to the start of the procedure. A Minimum amount of bleeding was controlled with Pressure. The procedure was tolerated well. Post Debridement Measurements: 1cm length x 0.9cm width x 0.3cm depth; 0.212cm^3 volume. Character of Wound/Ulcer Post Debridement is improved. Severity of Tissue Post Debridement is: Fat layer exposed. Post procedure Diagnosis Wound #1: Same as Pre-Procedure Plan Follow-up Appointments: Return Appointment in 1 week. - Dr Lady Gary 02/02/23 @ 3:45pm Return Appointment in 2 weeks. - Dr. Lady Gary Room 3 (please schedule) Anesthetic: (In clinic) Topical Lidocaine 4% applied to wound bed Bathing/ Shower/ Hygiene: May shower and wash wound with soap and water. - After showering/Bathing, if you choose, you may perform the wound dressing changes Off-Loading: Wound #1 Right,Medial,Plantar Foot: Wedge shoe to: - right foot - wear when walking, try to keep pressure off wound area at all times WOUND #1: - Foot Wound Laterality: Plantar, Right, Medial Cleanser: Soap and Water 1 x Per Day/15 Days Discharge Instructions: May shower and wash wound with dial antibacterial soap and water prior  to dressing change. Cleanser: Vashe 5.8 (oz) 1 x Per Day/15 Days Discharge Instructions: Or Cleanse the wound with Vashe prior to applying a clean dressing using gauze sponges, not tissue or cotton balls. Peri-Wound Care: Zinc Oxide Ointment 30g tube 1 x Per Day/15 Days Discharge Instructions: Apply Zinc Oxide to periwound with each dressing change Topical: Gentamicin 1 x Per Day/15 Days Discharge Instructions: As directed by physician Topical: Mupirocin Ointment 1 x Per Day/15 Days Discharge Instructions: Apply Mupirocin (Bactroban) as  instructed Prim Dressing: Maxorb Extra Ag+ Alginate Dressing, 2x2 (in/in) (Generic) 1 x Per Day/15 Days ary Discharge Instructions: Apply to wound bed as instructed Secondary Dressing: Zetuvit Plus Silicone Border Dressing 4x4 (in/in) (Dispense As Written) 1 x Per Day/15 Days Discharge Instructions: Apply silicone border over primary dressing as directed. 01/24/2023: The wound is about the same size today, but the overall appearance is improved. There is no tissue maceration. The surface has a layer of slough overlying the granulation tissue. No malodor or purulent drainage. I used a curette to debride callus, skin, slough, and subcutaneous tissue from the wound. We will continue topical gentamicin and mupirocin with silver alginate, along with periwound zinc oxide. He should complete the course of oral Augmentin prescribed. Follow-up in 1 week. [name], [name] ([id number]) [id number] Page 7 of 9 Electronic Signature(s) Signed: 01/24/2023 12:18:32 PM By: Duanne Guess MD FACS Entered By: Duanne Guess on 01/24/2023 12:18:32 -------------------------------------------------------------------------------- HxROS Details Patient Name: Date of Service: [name]. 01/24/2023 11:00 A M Medical Record Number: [id number] Patient Account Number: [id number] Date of Birth/Sex: Treating RN: August 12, 1945 (77 y.o. M) Primary Care Provider: Oliver Barre Other Clinician: Referring Provider: Treating Provider/Extender: Joeseph Amor in Treatment: 5 Information Obtained From Patient Chart Eyes Medical History: Positive for: Cataracts - Cataract Extraction Respiratory Medical History: Past Medical History Notes: Allergic Rhinitis Acute Bronchitis Pharyngitis Cardiovascular Medical History: Positive for: Arrhythmia - A.Fib;V.Fib; Congestive Heart Failure - Chronic Systolic HF; Coronary Artery Disease; Hypertension Past Medical History  Notes: 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 Hyperlipidemia Implantable Cardioverter-Defibrillator (ICD) in situ Endocrine Medical History: Positive for: Type II Diabetes Past Medical History Notes: Last Hem A1C 7.2% on 11/21/22 Hypogonadism, Male Time with diabetes: 6 years Treated with: Insulin Genitourinary Medical History: Past Medical History Notes: Nephrolithiasis Integumentary (Skin) Medical History: Past Medical History Notes: Neoplasm of uncertain behavior of skin Callus Right foot Skin Lesion Neurologic Medical History: Positive for: Neuropathy - Diabetic Peripheral Neuropathy [name], [name] ([id number]) [phone].pdf Page 8 of 9 Psychiatric Medical History: Past Medical History Notes: General anxiety HBO Extended History Items Eyes: Cataracts Immunizations Pneumococcal Vaccine: Received Pneumococcal Vaccination: Yes Received Pneumococcal Vaccination On or After 60th Birthday: Yes Implantable Devices Yes Hospitalization / Surgery History Type of Hospitalization/Surgery Cataract Extraction Implanted Cardiac Defibrillator Family and Social History Unknown History: Yes; Never smoker; Marital Status - Divorced; Alcohol Use: Never; Drug Use: No History; Caffeine Use: Never; Financial Concerns: No; Food, Clothing or Shelter Needs: No; Support System Lacking: No; Transportation Concerns: No Electronic Signature(s) Signed: 01/24/2023 1:54:30 PM By: Duanne Guess MD FACS Entered By: Duanne Guess on 01/24/2023 12:14:56 -------------------------------------------------------------------------------- SuperBill Details Patient Name: Date of Service: [name]. 01/24/2023 Medical Record Number: [id number] Patient Account Number: [id number] Date of Birth/Sex: Treating RN: [birth year] (77 y.o. M) Primary Care Provider: Oliver Barre Other Clinician: Referring Provider: Treating  Provider/Extender: Joeseph Amor in Treatment: 5 Diagnosis Coding ICD-10 Codes Code Description [phone] Non-pressure chronic ulcer of other part of  right foot with fat layer exposed E11.621 Type 2 diabetes mellitus with foot ulcer I73.9 Peripheral vascular disease, unspecified E11.49 Type 2 diabetes mellitus with other diabetic neurological complication L84 Corns and callosities Facility Procedures : CPT4 Code: [id number] Description: 11042 - DEB SUBQ TISSUE 20 SQ CM/< ICD-10 Diagnosis Description L97.512 Non-pressure chronic ulcer of other part of right foot with fat layer exposed Modifier: Quantity: 1 Physician Procedures : CPT4 Code Description Modifier [id number] - WC PHYS LEVEL 4 - EST PT 25 ICD-10 Diagnosis Description L97.512 Non-pressure chronic ulcer of other part of right foot with fat layer exposed Britten, Gagandeep [name] ([id number]) [id number]  E11.621 Type 2 diabetes mellitus with foot ulcer I73.9 Peripheral vascular disease, unspecified E11.49 Type 2 diabetes mellitus with other diabetic neurological complication Quantity: 1 227.pdf Page 9 of 9 : [phone] 11042 - WC PHYS SUBQ TISS 20 SQ CM 1 ICD-10 Diagnosis Description L97.512 Non-pressure chronic ulcer of other part of right foot with fat layer exposed Quantity: Electronic Signature(s) Signed: 01/24/2023 12:18:59 PM By: Duanne Guess MD FACS Entered By: Duanne Guess on 01/24/2023 12:18:58

## 2023-01-25 NOTE — Progress Notes (Signed)
ELDA, SMUCKER ([id number]) [id number] Page 1 of 7 Visit Report for 01/24/2023 Arrival Information Details Patient Name: Date of Service: [name], [name] MES L. 01/24/2023 11:00 A M Medical Record Number: [id number] Patient Account Number: [id number] Date of Birth/Sex: Treating RN: [birth year] (77 y.o. Dianna Limbo Primary Care Eri Mcevers: Oliver Barre Other Clinician: Referring Joelyn Lover: Treating Ihsan Nomura/Extender: Joeseph Amor in Treatment: 5 Visit Information History Since Last Visit Added or deleted any medications: No Patient Arrived: Ambulatory Any new allergies or adverse reactions: No Arrival Time: 11:32 Had a fall or experienced change in No Accompanied By: self activities of daily living that may affect Transfer Assistance: None risk of falls: Patient Identification Verified: Yes Signs or symptoms of abuse/neglect since last visito No Patient Has Alerts: Yes Hospitalized since last visit: No Patient Alerts: Patient on Blood Thinner Implantable device outside of the clinic excluding No Xarelto cellular tissue based products placed in the center since last visit: Has Dressing in Place as Prescribed: Yes Pain Present Now: No Electronic Signature(s) Signed: 01/24/2023 5:43:58 PM By: Karie Schwalbe RN Entered By: Karie Schwalbe on 01/24/2023 11:49:35 -------------------------------------------------------------------------------- Encounter Discharge Information Details Patient Name: Date of Service: [name] MES L. 01/24/2023 11:00 A M Medical Record Number: [id number] Patient Account Number: [id number] Date of Birth/Sex: Treating RN: 1946-01-30 (77 y.o. Dianna Limbo Primary Care Antoinette Borgwardt: Oliver Barre Other Clinician: Referring Lillee Mooneyhan: Treating Aulani Shipton/Extender: Joeseph Amor in Treatment: 5 Encounter Discharge Information Items Post Procedure Vitals Discharge Condition: Stable Temperature  (F): 98.4 Ambulatory Status: Ambulatory Pulse (bpm): 90 Discharge Destination: Home Respiratory Rate (breaths/min): 16 Transportation: Private Auto Blood Pressure (mmHg): 90/62 Accompanied By: self Schedule Follow-up Appointment: Yes Clinical Summary of Care: Patient Declined Electronic Signature(s) Signed: 01/24/2023 5:43:58 PM By: Karie Schwalbe RN Entered By: Karie Schwalbe on 01/24/2023 17:15:51 [name] ([id number]) [id number] Page 2 of 7 -------------------------------------------------------------------------------- Lower Extremity Assessment Details Patient Name: Date of Service: [name], [name] MES L. 01/24/2023 11:00 A M Medical Record Number: [id number] Patient Account Number: [id number] Date of Birth/Sex: Treating RN: April 12, 1945 (77 y.o. Dianna Limbo Primary Care Ned Kakar: Oliver Barre Other Clinician: Referring Tearsa Kowalewski: Treating Maribeth [name]/Extender: Joeseph Amor in Treatment: 5 Edema Assessment Assessed: [Left: No] [Right: No] Edema: [Left: No] [Right: No] Calf Left: Right: Point of Measurement: 29 cm From Medial Instep 36 cm 35.5 cm Ankle Left: Right: Point of Measurement: 10 cm From Medial Instep 22 cm 21 cm Vascular Assessment Pulses: Dorsalis Pedis Palpable: [Right:Yes] Extremity colors, hair growth, and conditions: Extremity Color: [Left:Normal] [Right:Normal] Hair Growth on Extremity: [Left:Yes] [Right:Yes] Temperature of Extremity: [Left:Warm] [Right:Warm] Capillary Refill: [Left:< 3 seconds] [Right:< 3 seconds] Dependent Rubor: [Left:No No] [Right:No No] Electronic Signature(s) Signed: 01/24/2023 5:43:58 PM By: Karie Schwalbe RN Entered By: Karie Schwalbe on 01/24/2023 11:50:35 -------------------------------------------------------------------------------- Multi Wound Chart Details Patient Name: Date of Service: [name] MES L. 01/24/2023 11:00 A M Medical Record Number: [id number] Patient  Account Number: [id number] Date of Birth/Sex: Treating RN: January 02, 1946 (77 y.o. M) Primary Care Tynan Boesel: Oliver Barre Other Clinician: Referring Jemell Town: Treating Alora Gorey/Extender: Joeseph Amor in Treatment: 5 Vital Signs Height(in): 70 Pulse(bpm): 90 Weight(lbs): 186 Blood Pressure(mmHg): 90/62 Body Mass Index(BMI): 26.7 Temperature(F): 98.4 Respiratory Rate(breaths/min): 16 [1:Photos:] [N/A:N/A] Right, Medial, Plantar Foot N/A N/A Wound Location: Gradually Appeared N/A N/A Wounding Event: Diabetic Wound/Ulcer of the Lower N/A N/A Primary Etiology: Extremity Cataracts, Arrhythmia, Congestive N/A N/A Comorbid History: Heart Failure, Coronary Artery Disease, Hypertension, Type II Diabetes, Neuropathy 05/30/2022 N/A N/A Date  Acquired: 5 N/A N/A Weeks of Treatment: Open N/A N/A Wound Status: No N/A N/A Wound Recurrence: 1x0.9x0.3 N/A N/A Measurements L x W x D (cm) 0.707 N/A N/A A (cm) : rea 0.212 N/A N/A Volume (cm) : 30.80% N/A N/A % Reduction in A rea: 30.70% N/A N/A % Reduction in Volume: 6 Starting Position 1 (o'clock): 3 Ending Position 1 (o'clock): 0.3 Maximum Distance 1 (cm): Yes N/A N/A Undermining: Grade 2 N/A N/A Classification: Medium N/A N/A Exudate A mount: Serosanguineous N/A N/A Exudate Type: red, brown N/A N/A Exudate Color: Thickened N/A N/A Wound Margin: Large (67-100%) N/A N/A Granulation A mount: Red, Pink N/A N/A Granulation Quality: Small (1-33%) N/A N/A Necrotic A mount: Fat Layer (Subcutaneous Tissue): Yes N/A N/A Exposed Structures: Fascia: No Tendon: No Muscle: No Joint: No Bone: No Small (1-33%) N/A N/A Epithelialization: Debridement - Excisional N/A N/A Debridement: Pre-procedure Verification/Time Out 11:50 N/A N/A Taken: Callus, Subcutaneous, Slough N/A N/A Tissue Debrided: Skin/Subcutaneous Tissue N/A N/A Level: 0.71 N/A N/A Debridement A (sq cm): rea Curette N/A  N/A Instrument: Minimum N/A N/A Bleeding: Pressure N/A N/A Hemostasis A chieved: Procedure was tolerated well N/A N/A Debridement Treatment Response: 1x0.9x0.3 N/A N/A Post Debridement Measurements L x W x D (cm) 0.212 N/A N/A Post Debridement Volume: (cm) Callus: Yes N/A N/A Periwound Skin Texture: No Abnormalities Noted N/A N/A Periwound Skin Moisture: No Abnormalities Noted N/A N/A Periwound Skin Color: No Abnormality N/A N/A Temperature: Debridement N/A N/A Procedures Performed: Treatment Notes Electronic Signature(s) Signed: 01/24/2023 12:13:32 PM By: Duanne Guess MD FACS Entered By: Duanne Guess on 01/24/2023 12:13:32 -------------------------------------------------------------------------------- Multi-Disciplinary Care Plan Details Patient Name: Date of Service: [name] MES L. 01/24/2023 11:00 A M Medical Record Number: [id number] Patient Account Number: [id number] EVERRETT, DAVIDOVICH ([id number]) [phone].pdf Page 4 of 7 Date of Birth/Sex: Treating RN: [birth year] (77 y.o. Dianna Limbo Primary Care Omya Winfield: Other Clinician: Oliver Barre Referring Monseratt Ledin: Treating Larra Crunkleton/Extender: Joeseph Amor in Treatment: 5 Active Inactive Wound/Skin Impairment Nursing Diagnoses: Impaired tissue integrity Goals: Patient/caregiver will verbalize understanding of skin care regimen Date Initiated: 12/16/2022 Target Resolution Date: 02/28/2023 Goal Status: Active Interventions: Assess ulceration(s) every visit Treatment Activities: Skin care regimen initiated : 12/16/2022 Notes: Electronic Signature(s) Signed: 01/24/2023 5:43:58 PM By: Karie Schwalbe RN Entered By: Karie Schwalbe on 01/24/2023 11:57:11 -------------------------------------------------------------------------------- Pain Assessment Details Patient Name: Date of Service: [name] MES L. 01/24/2023 11:00 A M Medical Record Number:  [id number] Patient Account Number: [id number] Date of Birth/Sex: Treating RN: [birth year] (77 y.o. Dianna Limbo Primary Care Lamarion Mcevers: Oliver Barre Other Clinician: Referring Emmagene Ortner: Treating Dera Vanaken/Extender: Joeseph Amor in Treatment: 5 Active Problems Location of Pain Severity and Description of Pain Patient Has Paino No Site Locations Pain Management and Medication Current Pain Management: [name], SAWHNEY ([id number]) [phone].pdf Page 5 of 7 Electronic Signature(s) Signed: 01/24/2023 5:43:58 PM By: Karie Schwalbe RN Entered By: Karie Schwalbe on 01/24/2023 11:50:25 -------------------------------------------------------------------------------- Patient/Caregiver Education Details Patient Name: Date of Service: [name] MES L. 11/26/2024andnbsp11:00 A M Medical Record Number: [id number] Patient Account Number: [id number] Date of Birth/Gender: Treating RN: 1945/11/28 (77 y.o. Dianna Limbo Primary Care Physician: Oliver Barre Other Clinician: Referring Physician: Treating Physician/Extender: Joeseph Amor in Treatment: 5 Education Assessment Education Provided To: Patient Education Topics Provided Wound/Skin Impairment: Methods: Explain/Verbal Responses: State content correctly Electronic Signature(s) Signed: 01/24/2023 5:43:58 PM By: Karie Schwalbe RN Entered By: Karie Schwalbe on 01/24/2023 17:10:57 -------------------------------------------------------------------------------- Wound Assessment Details Patient Name: Date of Service:  [name], [name] MES L. 01/24/2023 11:00 A M Medical Record Number: [id number] Patient Account Number: [id number] Date of Birth/Sex: Treating RN: 11/24/1945 (77 y.o. Dianna Limbo Primary Care Amarionna Arca: Oliver Barre Other Clinician: Referring Admiral Marcucci: Treating Stuart Mirabile/Extender: Joeseph Amor in Treatment: 5 Wound Status Wound Number: 1  Primary Diabetic Wound/Ulcer of the Lower Extremity Etiology: Wound Location: Right, Medial, Plantar Foot Wound Open Wounding Event: Gradually Appeared Status: Date Acquired: 05/30/2022 Comorbid Cataracts, Arrhythmia, Congestive Heart Failure, Coronary Artery Weeks Of Treatment: 5 History: Disease, Hypertension, Type II Diabetes, Neuropathy Clustered Wound: No Photos AYCE, BOMBARA ([id number]) [id number].pdf Page 6 of 7 Wound Measurements Length: (cm) 1 Width: (cm) 0.9 Depth: (cm) 0.3 Area: (cm) 0.707 Volume: (cm) 0.212 % Reduction in Area: 30.8% % Reduction in Volume: 30.7% Epithelialization: Small (1-33%) Tunneling: No Undermining: Yes Starting Position (o'clock): 6 Ending Position (o'clock): 3 Maximum Distance: (cm) 0.3 Wound Description Classification: Grade 2 Wound Margin: Thickened Exudate Amount: Medium Exudate Type: Serosanguineous Exudate Color: red, brown Foul Odor After Cleansing: No Slough/Fibrino Yes Wound Bed Granulation Amount: Large (67-100%) Exposed Structure Granulation Quality: Red, Pink Fascia Exposed: No Necrotic Amount: Small (1-33%) Fat Layer (Subcutaneous Tissue) Exposed: Yes Necrotic Quality: Adherent Slough Tendon Exposed: No Muscle Exposed: No Joint Exposed: No Bone Exposed: No Periwound Skin Texture Texture Color No Abnormalities Noted: No No Abnormalities Noted: Yes Callus: Yes Temperature / Pain Temperature: No Abnormality Moisture No Abnormalities Noted: Yes Treatment Notes Wound #1 (Foot) Wound Laterality: Plantar, Right, Medial Cleanser Soap and Water Discharge Instruction: May shower and wash wound with dial antibacterial soap and water prior to dressing change. Vashe 5.8 (oz) Discharge Instruction: Or Cleanse the wound with Vashe prior to applying a clean dressing using gauze sponges, not tissue or cotton balls. Peri-Wound Care Zinc Oxide Ointment 30g tube Discharge Instruction: Apply Zinc Oxide to  periwound with each dressing change Topical Gentamicin Discharge Instruction: As directed by physician Mupirocin Ointment Discharge Instruction: Apply Mupirocin (Bactroban) as instructed Primary Dressing Maxorb Extra Ag+ Alginate Dressing, 2x2 (in/in) Discharge Instruction: Apply to wound bed as instructed Secondary Dressing Zetuvit Plus Silicone Border Dressing 4x4 (in/in) [name], [name] ([id number]) [phone].pdf Page 7 of 7 Discharge Instruction: Apply silicone border over primary dressing as directed. Secured With Compression Wrap Compression Stockings Facilities manager) Signed: 01/24/2023 5:43:58 PM By: Karie Schwalbe RN Entered By: Karie Schwalbe on 01/24/2023 11:43:38 -------------------------------------------------------------------------------- Vitals Details Patient Name: Date of Service: [name] MES L. 01/24/2023 11:00 A M Medical Record Number: [id number] Patient Account Number: [id number] Date of Birth/Sex: Treating RN: 02-18-1946 (77 y.o. Dianna Limbo Primary Care Warren Kugelman: Oliver Barre Other Clinician: Referring Kenson Groh: Treating Zhana Jeangilles/Extender: Joeseph Amor in Treatment: 5 Vital Signs Time Taken: 11:50 Temperature (F): 98.4 Height (in): 70 Pulse (bpm): 90 Weight (lbs): 186 Respiratory Rate (breaths/min): 16 Body Mass Index (BMI): 26.7 Blood Pressure (mmHg): 90/62 Reference Range: 80 - 120 mg / dl Electronic Signature(s) Signed: 01/24/2023 5:43:58 PM By: Karie Schwalbe RN Entered By: Karie Schwalbe on 01/24/2023 11:50:19

## 2023-01-30 ENCOUNTER — Other Ambulatory Visit: Payer: Self-pay | Admitting: Internal Medicine

## 2023-01-30 DIAGNOSIS — E0842 Diabetes mellitus due to underlying condition with diabetic polyneuropathy: Secondary | ICD-10-CM

## 2023-01-31 DIAGNOSIS — I5020 Unspecified systolic (congestive) heart failure: Secondary | ICD-10-CM | POA: Diagnosis not present

## 2023-02-02 ENCOUNTER — Encounter (HOSPITAL_BASED_OUTPATIENT_CLINIC_OR_DEPARTMENT_OTHER): Payer: Medicare Other | Attending: Internal Medicine | Admitting: Internal Medicine

## 2023-02-02 DIAGNOSIS — I509 Heart failure, unspecified: Secondary | ICD-10-CM | POA: Insufficient documentation

## 2023-02-02 DIAGNOSIS — I110 Hypertensive heart disease with heart failure: Secondary | ICD-10-CM | POA: Diagnosis not present

## 2023-02-02 DIAGNOSIS — L84 Corns and callosities: Secondary | ICD-10-CM | POA: Diagnosis not present

## 2023-02-02 DIAGNOSIS — L97512 Non-pressure chronic ulcer of other part of right foot with fat layer exposed: Secondary | ICD-10-CM | POA: Diagnosis not present

## 2023-02-02 DIAGNOSIS — E1149 Type 2 diabetes mellitus with other diabetic neurological complication: Secondary | ICD-10-CM | POA: Diagnosis not present

## 2023-02-02 DIAGNOSIS — E1151 Type 2 diabetes mellitus with diabetic peripheral angiopathy without gangrene: Secondary | ICD-10-CM | POA: Diagnosis not present

## 2023-02-02 DIAGNOSIS — E11621 Type 2 diabetes mellitus with foot ulcer: Secondary | ICD-10-CM | POA: Insufficient documentation

## 2023-02-02 DIAGNOSIS — Z89431 Acquired absence of right foot: Secondary | ICD-10-CM | POA: Insufficient documentation

## 2023-02-02 DIAGNOSIS — Z792 Long term (current) use of antibiotics: Secondary | ICD-10-CM | POA: Insufficient documentation

## 2023-02-03 NOTE — Progress Notes (Signed)
[name] ([id number]) [id number] Page 1 of 5 Visit Report for 02/02/2023 HPI Details Patient Name: Date of Service: [name], [name]. 02/02/2023 3:45 PM Medical Record Number: [id number] Patient Account Number: [id number] Date of Birth/Sex: Treating RN: Dec 27, 1945 (77 y.o. M) Primary Care Provider: Oliver Barre Other Clinician: Referring Provider: Treating Provider/Extender: Carley Hammed in Treatment: 6 History of Present Illness HPI Description: ADMISSION 12/16/2022 ***ABIs [name]: 0.95; R: 0.88*** This is a 77 year old type II diabetic (last hemoglobin A1c 7.2% on November 20, 2022) with a fairly extensive cardiac and peripheral vascular history. 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 Most recently, he has been followed by a podiatrist in [place] for right and left diabetic foot ulcers on the first metatarsal head. He says they were at first applying some kind of cream, the name of which he does not remember and have now been applying what he describes as a "some white stuff." Due to failure of the wounds to heal, he was referred to the wound care center for further evaluation and management. 12/29/2022: The left foot wound is healed. The right first metatarsal head wound is surrounded with macerated callus and skin. There is some slough on the surface. 01/12/2023: The tissue maceration is much better this week. There is an odor coming from the wound, however. The wound is stable in size. 01/19/2023: The culture that I took last week grew out multiple species, including Enterococcus, strep, staph, and small amounts of Pseudomonas. I prescribed Augmentin and he is currently taking this. The wound itself is looking a little bit better. Less maceration and a healthier appearing surface. The odor has abated. 01/24/2023: The wound is about the same size today, but the overall appearance is improved. There  is no tissue maceration. The surface has a layer of slough overlying the granulation tissue. No malodor or purulent drainage. 12/5; the patient has a small wound on the right first metatarsal head. There is nothing open on the left side. This is a diabetic foot ulcer we have been using gentamicin mupirocin and silver alginate. Electronic Signature(s) Signed: 02/02/2023 5:41:20 PM By: Baltazar Najjar MD Entered By: Baltazar Najjar on 02/02/2023 17:31:40 -------------------------------------------------------------------------------- Physical Exam Details Patient Name: Date of Service: [name] Aase MES [name]. 02/02/2023 3:45 PM Medical Record Number: [id number] Patient Account Number: [id number] Date of Birth/Sex: Treating RN: [birth year] (77 y.o. M) Primary Care Provider: Oliver Barre Other Clinician: Referring Provider: Treating Provider/Extender: Carley Hammed in Treatment: 6 Constitutional Patient is hypotensive.However he appears well. Respirations regular, non-labored and within target range.. Temperature is normal and within the target range for the patient.Marland Kitchen Appears in no distress. Notes Wound exam; the patient has a small clean well granulated wound on the plantar first metatarsal head on the right there is no surrounding infection no maceration this does not probe deeply. Electronic Signature(s) Signed: 02/02/2023 5:41:20 PM By: Baltazar Najjar MD Entered By: Baltazar Najjar on 02/02/2023 17:32:50 [name], [name] ([id number]) [id number] Page 2 of 5 -------------------------------------------------------------------------------- Physician Orders Details Patient Name: Date of Service: [name], [name]. 02/02/2023 3:45 PM Medical Record Number: [id number] Patient Account Number: [id number] Date of Birth/Sex: Treating RN: 13-Dec-1945 (77 y.o. Marlan Palau Primary Care Provider: Oliver Barre Other Clinician: Referring Provider: Treating  Provider/Extender: Carley Hammed in Treatment: 6 The following information was scribed by: Samuella Bruin The information was scribed for: Baltazar Najjar Verbal / Phone Orders: No Diagnosis Coding Follow-up Appointments ppointment in  2 weeks. - Dr. Lady Gary Return A Anesthetic (In clinic) Topical Lidocaine 5% applied to wound bed Bathing/ Shower/ Hygiene May shower and wash wound with soap and water. - After showering/Bathing, if you choose, you may perform the wound dressing changes Off-Loading Wound #1 Right,Medial,Plantar Foot Wedge shoe to: - right foot - wear when walking, try to keep pressure off wound area at all times Wound Treatment Wound #1 - Foot Wound Laterality: Plantar, Right, Medial Cleanser: Soap and Water 1 x Per Day/15 Days Discharge Instructions: May shower and wash wound with dial antibacterial soap and water prior to dressing change. Cleanser: Vashe 5.8 (oz) 1 x Per Day/15 Days Discharge Instructions: Or Cleanse the wound with Vashe prior to applying a clean dressing using gauze sponges, not tissue or cotton balls. Peri-Wound Care: Zinc Oxide Ointment 30g tube 1 x Per Day/15 Days Discharge Instructions: Apply Zinc Oxide to periwound with each dressing change Topical: Gentamicin 1 x Per Day/15 Days Discharge Instructions: As directed by physician Topical: Mupirocin Ointment 1 x Per Day/15 Days Discharge Instructions: Apply Mupirocin (Bactroban) as instructed Prim Dressing: Maxorb Extra Ag+ Alginate Dressing, 2x2 (in/in) (Generic) 1 x Per Day/15 Days ary Discharge Instructions: Apply to wound bed as instructed Secondary Dressing: Zetuvit Plus Silicone Border Dressing 4x4 (in/in) (Dispense As Written) 1 x Per Day/15 Days Discharge Instructions: Apply silicone border over primary dressing as directed. Patient Medications llergies: Salmon, Sulfa Antibiotics A Notifications Medication Indication Start End 02/02/2023 lidocaine DOSE topical 5  % ointment - ointment topical Electronic Signature(s) Signed: 02/02/2023 4:50:46 PM By: Samuella Bruin Signed: 02/02/2023 5:41:20 PM By: Baltazar Najjar MD Entered By: Samuella Bruin on 02/02/2023 16:40:03 Franks, [name] ([id number]) [id number] Page 3 of 5 -------------------------------------------------------------------------------- Problem List Details Patient Name: Date of Service: [name], [name]. 02/02/2023 3:45 PM Medical Record Number: [id number] Patient Account Number: [id number] Date of Birth/Sex: Treating RN: Jul 25, 1945 (77 y.o. M) Primary Care Provider: Oliver Barre Other Clinician: Referring Provider: Treating Provider/Extender: Carley Hammed in Treatment: 6 Active Problems ICD-10 Encounter Code Description Active Date MDM Diagnosis [phone] Non-pressure chronic ulcer of other part of right foot with fat layer exposed 12/16/2022 No Yes E11.621 Type 2 diabetes mellitus with foot ulcer 12/16/2022 No Yes I73.9 Peripheral vascular disease, unspecified 12/16/2022 No Yes E11.49 Type 2 diabetes mellitus with other diabetic neurological complication 12/16/2022 No Yes L84 Corns and callosities 12/16/2022 No Yes Inactive Problems Resolved Problems ICD-10 Code Description Active Date Resolved Date L97.521 Non-pressure chronic ulcer of other part of left foot limited to breakdown of skin 12/16/2022 12/16/2022 Electronic Signature(s) Signed: 02/02/2023 5:41:20 PM By: Baltazar Najjar MD Entered By: Baltazar Najjar on 02/02/2023 17:30:38 -------------------------------------------------------------------------------- Progress Note Details Patient Name: Date of Service: [name] Aase MES [name]. 02/02/2023 3:45 PM Medical Record Number: [id number] Patient Account Number: [id number] Date of Birth/Sex: Treating RN: 1946-01-11 (77 y.o. M) Primary Care Provider: Oliver Barre Other Clinician: Referring Provider: Treating Provider/Extender:  Carley Hammed in Treatment: [address], [place] ([id number]) [id number] Page 4 of 5 History of Present Illness (HPI) ADMISSION 12/16/2022 ***ABIs [name]: 0.95; R: 0.88*** This is a 77 year old type II diabetic (last hemoglobin A1c 7.2% on November 20, 2022) with a fairly extensive cardiac and peripheral vascular history. 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 Most recently, he has been followed by a podiatrist in [place] for right and left diabetic foot ulcers on the first metatarsal head. He says they were at first applying some kind of  cream, the name of which he does not remember and have now been applying what he describes as a "some white stuff." Due to failure of the wounds to heal, he was referred to the wound care center for further evaluation and management. 12/29/2022: The left foot wound is healed. The right first metatarsal head wound is surrounded with macerated callus and skin. There is some slough on the surface. 01/12/2023: The tissue maceration is much better this week. There is an odor coming from the wound, however. The wound is stable in size. 01/19/2023: The culture that I took last week grew out multiple species, including Enterococcus, strep, staph, and small amounts of Pseudomonas. I prescribed Augmentin and he is currently taking this. The wound itself is looking a little bit better. Less maceration and a healthier appearing surface. The odor has abated. 01/24/2023: The wound is about the same size today, but the overall appearance is improved. There is no tissue maceration. The surface has a layer of slough overlying the granulation tissue. No malodor or purulent drainage. 12/5; the patient has a small wound on the right first metatarsal head. There is nothing open on the left side. This is a diabetic foot ulcer we have been using gentamicin mupirocin and silver  alginate. Objective Constitutional Patient is hypotensive.However he appears well. Respirations regular, non-labored and within target range.. Temperature is normal and within the target range for the patient.Marland Kitchen Appears in no distress. Vitals Time Taken: 2:21 PM, Height: 70 in, Weight: 186 lbs, BMI: 26.7, Temperature: 97.8 F, Pulse: 85 bpm, Respiratory Rate: 18 breaths/min, Blood Pressure: 83/51 mmHg. General Notes: Wound exam; the patient has a small clean well granulated wound on the plantar first metatarsal head on the right there is no surrounding infection no maceration this does not probe deeply. Integumentary (Hair, Skin) Wound #1 status is Open. Original cause of wound was Gradually Appeared. The date acquired was: 05/30/2022. The wound has been in treatment 6 weeks. The wound is located on the Right,Medial,Plantar Foot. The wound measures 0.7cm length x 0.7cm width x 0.2cm depth; 0.385cm^2 area and 0.077cm^3 volume. There is Fat Layer (Subcutaneous Tissue) exposed. There is no tunneling or undermining noted. There is a medium amount of serosanguineous drainage noted. The wound margin is thickened. There is large (67-100%) red, pink granulation within the wound bed. There is a small (1-33%) amount of necrotic tissue within the wound bed including Adherent Slough. The periwound skin appearance had no abnormalities noted for moisture. The periwound skin appearance had no abnormalities noted for color. The periwound skin appearance exhibited: Callus. Periwound temperature was noted as No Abnormality. Assessment Active Problems ICD-10 Non-pressure chronic ulcer of other part of right foot with fat layer exposed Type 2 diabetes mellitus with foot ulcer Peripheral vascular disease, unspecified Type 2 diabetes mellitus with other diabetic neurological complication Corns and callosities Plan Follow-up Appointments: Return Appointment in 2 weeks. - Dr. Lady Gary Anesthetic: (In clinic) Topical  Lidocaine 5% applied to wound bed Bathing/ Shower/ Hygiene: May shower and wash wound with soap and water. - After showering/Bathing, if you choose, you may perform the wound dressing changes Off-Loading: Wound #1 Right,Medial,Plantar Foot: Wedge shoe to: - right foot - wear when walking, try to keep pressure off wound area at all times The following medication(s) was prescribed: lidocaine topical 5 % ointment ointment topical was prescribed at facility WOUND #1: - Foot Wound Laterality: Plantar, Right, Medial [name], [name] ([id number]) [id number] Page 5 of 5 Cleanser: Soap and Water 1 x  Per Day/15 Days Discharge Instructions: May shower and wash wound with dial antibacterial soap and water prior to dressing change. Cleanser: Vashe 5.8 (oz) 1 x Per Day/15 Days Discharge Instructions: Or Cleanse the wound with Vashe prior to applying a clean dressing using gauze sponges, not tissue or cotton balls. Peri-Wound Care: Zinc Oxide Ointment 30g tube 1 x Per Day/15 Days Discharge Instructions: Apply Zinc Oxide to periwound with each dressing change Topical: Gentamicin 1 x Per Day/15 Days Discharge Instructions: As directed by physician Topical: Mupirocin Ointment 1 x Per Day/15 Days Discharge Instructions: Apply Mupirocin (Bactroban) as instructed Prim Dressing: Maxorb Extra Ag+ Alginate Dressing, 2x2 (in/in) (Generic) 1 x Per Day/15 Days ary Discharge Instructions: Apply to wound bed as instructed Secondary Dressing: Zetuvit Plus Silicone Border Dressing 4x4 (in/in) (Dispense As Written) 1 x Per Day/15 Days Discharge Instructions: Apply silicone border over primary dressing as directed. Continue with gentamicin mupirocin and silver alginate. Condition and size of the wound was improved today. I saw no reason to change the dressing. No antibiotics required and no debridement was required Electronic Signature(s) Signed: 02/02/2023 5:41:20 PM By: Baltazar Najjar  MD Entered By: Baltazar Najjar on 02/02/2023 17:33:45 -------------------------------------------------------------------------------- SuperBill Details Patient Name: Date of Service: [name] Aase MES [name]. 02/02/2023 Medical Record Number: [id number] Patient Account Number: [id number] Date of Birth/Sex: Treating RN: 01/10/1946 (77 y.o. M) Primary Care Provider: Oliver Barre Other Clinician: Referring Provider: Treating Provider/Extender: Carley Hammed in Treatment: 6 Diagnosis Coding ICD-10 Codes Code Description [phone] Non-pressure chronic ulcer of other part of right foot with fat layer exposed E11.621 Type 2 diabetes mellitus with foot ulcer I73.9 Peripheral vascular disease, unspecified E11.49 Type 2 diabetes mellitus with other diabetic neurological complication L84 Corns and callosities Physician Procedures : CPT4 Code Description Modifier [id number] - WC PHYS LEVEL 3 - EST PT ICD-10 Diagnosis Description L97.512 Non-pressure chronic ulcer of other part of right foot with fat layer exposed E11.621 Type 2 diabetes mellitus with foot ulcer Quantity: 1 Electronic Signature(s) Signed: 02/02/2023 5:41:20 PM By: Baltazar Najjar MD Entered By: Baltazar Najjar on 02/02/2023 17:34:15

## 2023-02-03 NOTE — Progress Notes (Signed)
[name], [name] ([id number]) [id number] Page 1 of 7 Visit Report for 02/02/2023 Arrival Information Details Patient Name: Date of Service: [name], SIRMAN [name] L. 02/02/2023 3:45 PM Medical Record Number: [id number] Patient Account Number: [id number] Date of Birth/Sex: Treating RN: [birth year] (77 y.o. [name] Primary Care Meshell Abdulaziz: Oliver Barre Other Clinician: Referring Chyan Carnero: Treating Armin Yerger/Extender: Carley Hammed in Treatment: 6 Visit Information History Since Last Visit Added or deleted any medications: No Patient Arrived: Ambulatory Any new allergies or adverse reactions: No Arrival Time: 16:21 Had a fall or experienced change in No Accompanied By: self activities of daily living that may affect Transfer Assistance: None risk of falls: Patient Identification Verified: Yes Signs or symptoms of abuse/neglect since last visito No Secondary Verification Process Completed: Yes Hospitalized since last visit: No Patient Requires Transmission-Based Precautions: No Implantable device outside of the clinic excluding No Patient Has Alerts: Yes cellular tissue based products placed in the center Patient Alerts: Patient on Blood Thinner since last visit: Xarelto Has Dressing in Place as Prescribed: Yes Pain Present Now: No Electronic Signature(s) Signed: 02/02/2023 4:50:46 PM By: Samuella Bruin Entered By: Samuella Bruin on 02/02/2023 16:21:18 -------------------------------------------------------------------------------- Encounter Discharge Information Details Patient Name: Date of Service: [name] [name] L. 02/02/2023 3:45 PM Medical Record Number: [id number] Patient Account Number: [id number] Date of Birth/Sex: Treating RN: Mar 06, 1945 (77 y.o. [name] Primary Care Maxine Huynh: Oliver Barre Other Clinician: Referring Vollie Aaron: Treating Kingdom Vanzanten/Extender: Carley Hammed in Treatment:  6 Encounter Discharge Information Items Discharge Condition: Stable Ambulatory Status: Ambulatory Discharge Destination: Home Transportation: Private Auto Accompanied By: self Schedule Follow-up Appointment: Yes Clinical Summary of Care: Patient Declined Electronic Signature(s) Signed: 02/02/2023 4:50:46 PM By: Samuella Bruin Entered By: Samuella Bruin on 02/02/2023 16:40:48 [name], [name] ([id number]) [id number] Page 2 of 7 -------------------------------------------------------------------------------- Lower Extremity Assessment Details Patient Name: Date of Service: [name], STEMM [name] L. 02/02/2023 3:45 PM Medical Record Number: [id number] Patient Account Number: [id number] Date of Birth/Sex: Treating RN: Sep 07, 1945 (77 y.o. [name] Primary Care [name]: Oliver Barre Other Clinician: Referring Aaiden Depoy: Treating Mccauley Diehl/Extender: Carley Hammed in Treatment: 6 Edema Assessment Assessed: [name]: No] [name]: No] Edema: [Left: No] [Right: No] Calf Left: Right: Point of Measurement: 29 cm From Medial Instep 36 cm 35.5 cm Ankle Left: Right: Point of Measurement: 10 cm From Medial Instep 22 cm 21 cm Vascular Assessment Extremity colors, hair growth, and conditions: Extremity Color: [Left:Normal] [Right:Normal] Hair Growth on Extremity: [Left:Yes] [Right:Yes] Temperature of Extremity: [Left:Warm] [Right:Warm] Capillary Refill: [Left:< 3 seconds] [Right:< 3 seconds] Dependent Rubor: [Left:No No] [Right:No No] Electronic Signature(s) Signed: 02/02/2023 4:50:46 PM By: Samuella Bruin Entered By: Samuella Bruin on 02/02/2023 16:21:28 -------------------------------------------------------------------------------- Multi Wound Chart Details Patient Name: Date of Service: [name] [name] L. 02/02/2023 3:45 PM Medical Record Number: [id number] Patient Account Number: [id number] Date of Birth/Sex: Treating RN: 1946/02/15  (77 y.o. M) Primary Care Morrison Masser: Oliver Barre Other Clinician: Referring [name]: Treating Aslynn Brunetti/Extender: Carley Hammed in Treatment: 6 Vital Signs Height(in): 70 Pulse(bpm): 85 Weight(lbs): 186 Blood Pressure(mmHg): 83/51 Body Mass Index(BMI): 26.7 Temperature(F): 97.8 Respiratory Rate(breaths/min): 18 [1:Photos:] [N/A:N/A] Right, Medial, Plantar Foot N/A N/A Wound Location: Gradually Appeared N/A N/A Wounding Event: Diabetic Wound/Ulcer of the Lower N/A N/A Primary Etiology: Extremity Cataracts, Arrhythmia, Congestive N/A N/A Comorbid History: Heart Failure, Coronary Artery Disease, Hypertension, Type II Diabetes, Neuropathy 05/30/2022 N/A N/A Date Acquired: 6 N/A N/A Weeks of Treatment: Open N/A N/A Wound Status: No N/A N/A Wound Recurrence: 0.7x0.7x0.2 N/A  N/A Measurements L x W x D (cm) 0.385 N/A N/A A (cm) : rea 0.077 N/A N/A Volume (cm) : 62.30% N/A N/A % Reduction in A rea: 74.80% N/A N/A % Reduction in Volume: Grade 2 N/A N/A Classification: Medium N/A N/A Exudate A mount: Serosanguineous N/A N/A Exudate Type: red, brown N/A N/A Exudate Color: Thickened N/A N/A Wound Margin: Large (67-100%) N/A N/A Granulation A mount: Red, Pink N/A N/A Granulation Quality: Small (1-33%) N/A N/A Necrotic A mount: Fat Layer (Subcutaneous Tissue): Yes N/A N/A Exposed Structures: Fascia: No Tendon: No Muscle: No Joint: No Bone: No Small (1-33%) N/A N/A Epithelialization: Callus: Yes N/A N/A Periwound Skin Texture: No Abnormalities Noted N/A N/A Periwound Skin Moisture: No Abnormalities Noted N/A N/A Periwound Skin Color: No Abnormality N/A N/A Temperature: Treatment Notes Wound #1 (Foot) Wound Laterality: Plantar, Right, Medial Cleanser Soap and Water Discharge Instruction: May shower and wash wound with dial antibacterial soap and water prior to dressing change. Vashe 5.8 (oz) Discharge Instruction: Or Cleanse the  wound with Vashe prior to applying a clean dressing using gauze sponges, not tissue or cotton balls. Peri-Wound Care Zinc Oxide Ointment 30g tube Discharge Instruction: Apply Zinc Oxide to periwound with each dressing change Topical Gentamicin Discharge Instruction: As directed by physician Mupirocin Ointment Discharge Instruction: Apply Mupirocin (Bactroban) as instructed Primary Dressing Maxorb Extra Ag+ Alginate Dressing, 2x2 (in/in) Discharge Instruction: Apply to wound bed as instructed Secondary Dressing Zetuvit Plus Silicone Border Dressing 4x4 (in/in) Discharge Instruction: Apply silicone border over primary dressing as directed. Secured With Compression Wrap Compression Stockings Add-Ons ZAIM, PASQUINO ([id number]) [id number] Page 4 of 7 Electronic Signature(s) Signed: 02/02/2023 5:41:20 PM By: Baltazar Najjar MD Entered By: Baltazar Najjar on 02/02/2023 17:30:45 -------------------------------------------------------------------------------- Multi-Disciplinary Care Plan Details Patient Name: Date of Service: [name] [name] L. 02/02/2023 3:45 PM Medical Record Number: [id number] Patient Account Number: [id number] Date of Birth/Sex: Treating RN: 19-Jun-1945 (77 y.o. [name] Primary Care Tracer Gutridge: Oliver Barre Other Clinician: Referring Nakeitha Milligan: Treating Karrisa Didio/Extender: Carley Hammed in Treatment: 6 Active Inactive Wound/Skin Impairment Nursing Diagnoses: Impaired tissue integrity Goals: Patient/caregiver will verbalize understanding of skin care regimen Date Initiated: 12/16/2022 Target Resolution Date: 02/28/2023 Goal Status: Active Interventions: Assess ulceration(s) every visit Treatment Activities: Skin care regimen initiated : 12/16/2022 Notes: Electronic Signature(s) Signed: 02/02/2023 4:50:46 PM By: Samuella Bruin Entered By: Samuella Bruin on 02/02/2023  16:40:08 -------------------------------------------------------------------------------- Pain Assessment Details Patient Name: Date of Service: [name] [name] L. 02/02/2023 3:45 PM Medical Record Number: [id number] Patient Account Number: [id number] Date of Birth/Sex: Treating RN: [birth year] (77 y.o. [name] Primary Care Ramaj Frangos: Oliver Barre Other Clinician: Referring Lachina Salsberry: Treating Wendell Fiebig/Extender: Carley Hammed in Treatment: 6 Active Problems Location of Pain Severity and Description of Pain Patient Has Paino No Site Locations Rate the pain. LIVAN, KUBACKI ([id number]) [id number] Page 5 of 7 Rate the pain. Current Pain Level: 0 Pain Management and Medication Current Pain Management: Electronic Signature(s) Signed: 02/02/2023 4:50:46 PM By: Samuella Bruin Entered By: Samuella Bruin on 02/02/2023 16:21:25 -------------------------------------------------------------------------------- Patient/Caregiver Education Details Patient Name: Date of Service: [name] [name] L. 12/5/2024andnbsp3:45 PM Medical Record Number: [id number] Patient Account Number: [id number] Date of Birth/Gender: Treating RN: Jul 07, 1945 (77 y.o. [name] Primary Care Physician: Oliver Barre Other Clinician: Referring Physician: Treating Physician/Extender: Carley Hammed in Treatment: 6 Education Assessment Education Provided To: Patient Education Topics Provided Wound/Skin Impairment: Methods: Explain/Verbal Responses: Reinforcements needed, State content correctly Electronic Signature(s) Signed: 02/02/2023 4:50:46 PM  By: Gelene Mink By: Samuella Bruin on 02/02/2023 16:40:16 -------------------------------------------------------------------------------- Wound Assessment Details Patient Name: Date of Service: [name], [name] [name] L. 02/02/2023 3:45 PM Medical Record Number:  [id number] Patient Account Number: [id number] Date of Birth/Sex: Treating RN: 1945-04-30 (77 y.o. [name] Primary Care Samantha Olivera: Oliver Barre Other Clinician: Referring Noha Milberger: Treating Brittanny Levenhagen/Extender: Ryanlee, Babar, What Cheer ([id number]) [id number] Page 6 of 7 Weeks in Treatment: 6 Wound Status Wound Number: 1 Primary Diabetic Wound/Ulcer of the Lower Extremity Etiology: Wound Location: Right, Medial, Plantar Foot Wound Open Wounding Event: Gradually Appeared Status: Date Acquired: 05/30/2022 Comorbid Cataracts, Arrhythmia, Congestive Heart Failure, Coronary Artery Weeks Of Treatment: 6 History: Disease, Hypertension, Type II Diabetes, Neuropathy Clustered Wound: No Photos Wound Measurements Length: (cm) 0.7 Width: (cm) 0.7 Depth: (cm) 0.2 Area: (cm) 0.385 Volume: (cm) 0.077 % Reduction in Area: 62.3% % Reduction in Volume: 74.8% Epithelialization: Small (1-33%) Tunneling: No Undermining: No Wound Description Classification: Grade 2 Wound Margin: Thickened Exudate Amount: Medium Exudate Type: Serosanguineous Exudate Color: red, brown Foul Odor After Cleansing: No Slough/Fibrino Yes Wound Bed Granulation Amount: Large (67-100%) Exposed Structure Granulation Quality: Red, Pink Fascia Exposed: No Necrotic Amount: Small (1-33%) Fat Layer (Subcutaneous Tissue) Exposed: Yes Necrotic Quality: Adherent Slough Tendon Exposed: No Muscle Exposed: No Joint Exposed: No Bone Exposed: No Periwound Skin Texture Texture Color No Abnormalities Noted: No No Abnormalities Noted: Yes Callus: Yes Temperature / Pain Temperature: No Abnormality Moisture No Abnormalities Noted: Yes Treatment Notes Wound #1 (Foot) Wound Laterality: Plantar, Right, Medial Cleanser Soap and Water Discharge Instruction: May shower and wash wound with dial antibacterial soap and water prior to dressing change. Vashe 5.8 (oz) Discharge  Instruction: Or Cleanse the wound with Vashe prior to applying a clean dressing using gauze sponges, not tissue or cotton balls. Peri-Wound Care Zinc Oxide Ointment 30g tube Discharge Instruction: Apply Zinc Oxide to periwound with each dressing change Topical Gentamicin Discharge Instruction: As directed by physician Mupirocin Ointment INGVALD, BAYONA ([id number]) [id number] Page 7 of 7 Discharge Instruction: Apply Mupirocin (Bactroban) as instructed Primary Dressing Maxorb Extra Ag+ Alginate Dressing, 2x2 (in/in) Discharge Instruction: Apply to wound bed as instructed Secondary Dressing Zetuvit Plus Silicone Border Dressing 4x4 (in/in) Discharge Instruction: Apply silicone border over primary dressing as directed. Secured With Compression Wrap Compression Stockings Facilities manager) Signed: 02/02/2023 4:50:46 PM By: Samuella Bruin Entered By: Samuella Bruin on 02/02/2023 16:26:04 -------------------------------------------------------------------------------- Vitals Details Patient Name: Date of Service: [name] [name] L. 02/02/2023 3:45 PM Medical Record Number: [id number] Patient Account Number: [id number] Date of Birth/Sex: Treating RN: 12-26-1945 (77 y.o. [name] Primary Care Parth Mccormac: Oliver Barre Other Clinician: Referring Shanyn Preisler: Treating Domonic Hiscox/Extender: Carley Hammed in Treatment: 6 Vital Signs Time Taken: 14:21 Temperature (F): 97.8 Height (in): 70 Pulse (bpm): 85 Weight (lbs): 186 Respiratory Rate (breaths/min): 18 Body Mass Index (BMI): 26.7 Blood Pressure (mmHg): 83/51 Reference Range: 80 - 120 mg / dl Electronic Signature(s) Signed: 02/02/2023 4:50:46 PM By: Samuella Bruin Entered By: Samuella Bruin on 02/02/2023 16:24:15

## 2023-02-06 DIAGNOSIS — B351 Tinea unguium: Secondary | ICD-10-CM | POA: Diagnosis not present

## 2023-02-06 DIAGNOSIS — L851 Acquired keratosis [keratoderma] palmaris et plantaris: Secondary | ICD-10-CM | POA: Diagnosis not present

## 2023-02-06 DIAGNOSIS — E1142 Type 2 diabetes mellitus with diabetic polyneuropathy: Secondary | ICD-10-CM | POA: Diagnosis not present

## 2023-02-07 ENCOUNTER — Encounter (HOSPITAL_BASED_OUTPATIENT_CLINIC_OR_DEPARTMENT_OTHER): Payer: Medicare Other | Admitting: Internal Medicine

## 2023-02-07 DIAGNOSIS — E1151 Type 2 diabetes mellitus with diabetic peripheral angiopathy without gangrene: Secondary | ICD-10-CM | POA: Diagnosis not present

## 2023-02-07 DIAGNOSIS — Z89431 Acquired absence of right foot: Secondary | ICD-10-CM | POA: Diagnosis not present

## 2023-02-07 DIAGNOSIS — L84 Corns and callosities: Secondary | ICD-10-CM | POA: Diagnosis not present

## 2023-02-07 DIAGNOSIS — E11621 Type 2 diabetes mellitus with foot ulcer: Secondary | ICD-10-CM | POA: Diagnosis not present

## 2023-02-07 DIAGNOSIS — I509 Heart failure, unspecified: Secondary | ICD-10-CM | POA: Diagnosis not present

## 2023-02-07 DIAGNOSIS — E1149 Type 2 diabetes mellitus with other diabetic neurological complication: Secondary | ICD-10-CM | POA: Diagnosis not present

## 2023-02-07 DIAGNOSIS — I110 Hypertensive heart disease with heart failure: Secondary | ICD-10-CM | POA: Diagnosis not present

## 2023-02-07 DIAGNOSIS — L97512 Non-pressure chronic ulcer of other part of right foot with fat layer exposed: Secondary | ICD-10-CM | POA: Diagnosis not present

## 2023-02-07 DIAGNOSIS — Z792 Long term (current) use of antibiotics: Secondary | ICD-10-CM | POA: Diagnosis not present

## 2023-02-08 NOTE — Progress Notes (Signed)
[name] ([id number]) [id number] Page 1 of 6 Visit Report for 02/07/2023 HPI Details Patient Name: Date of Service: [name], [name]. 02/07/2023 2:30 PM Medical Record Number: [id number] Patient Account Number: [id number] Date of Birth/Sex: Treating RN: 1945-08-24 (77 y.o. M) Primary Care Provider: Oliver Barre Other Clinician: Referring Provider: Treating Provider/Extender: Carley Hammed in Treatment: 7 History of Present Illness HPI Description: ADMISSION 12/16/2022 ***ABIs [name]: 0.95; R: 0.88*** This is a 77 year old type II diabetic (last hemoglobin A1c 7.2% on November 20, 2022) with a fairly extensive cardiac and peripheral vascular history. 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 Most recently, he has been followed by a podiatrist in [place] for right and left diabetic foot ulcers on the first metatarsal head. He says they were at first applying some kind of cream, the name of which he does not remember and have now been applying what he describes as a "some white stuff." Due to failure of the wounds to heal, he was referred to the wound care center for further evaluation and management. 12/29/2022: The left foot wound is healed. The right first metatarsal head wound is surrounded with macerated callus and skin. There is some slough on the surface. 01/12/2023: The tissue maceration is much better this week. There is an odor coming from the wound, however. The wound is stable in size. 01/19/2023: The culture that I took last week grew out multiple species, including Enterococcus, strep, staph, and small amounts of Pseudomonas. I prescribed Augmentin and he is currently taking this. The wound itself is looking a little bit better. Less maceration and a healthier appearing surface. The odor has abated. 01/24/2023: The wound is about the same size today, but the overall appearance is improved.  There is no tissue maceration. The surface has a layer of slough overlying the granulation tissue. No malodor or purulent drainage. 12/5; the patient has a small wound on the right first metatarsal head. There is nothing open on the left side. This is a diabetic foot ulcer we have been using gentamicin mupirocin and silver alginate. 12/10; this is a small wound on the right first metatarsal head. Unfortunately not much different this week. 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 We have been using gent, mupirocin, silver alginate. Electronic Signature(s) Signed: 02/08/2023 10:03:28 AM By: Baltazar Najjar MD Entered By: Baltazar Najjar on 02/07/2023 13:20:53 -------------------------------------------------------------------------------- Physical Exam Details Patient Name: Date of Service: [name]. 02/07/2023 2:30 PM Medical Record Number: [id number] Patient Account Number: [id number] Date of Birth/Sex: Treating RN: [birth year] (77 y.o. M) Primary Care Provider: Oliver Barre Other Clinician: Referring Provider: Treating Provider/Extender: Carley Hammed in Treatment: 7 Constitutional Patient is hypotensive But he appears well. Notes Wound exam; the patient has a clean well granulated wound. There is a slight amount of undermining and a slight amount of callus buildup. But I did not debride any of this preferring to see if we can get any additional granulation. If this still stalls next week it will likely need a debridement. There is no evidence of infection this does not probe to bone Electronic Signature(s) [name], [name] ([id number]) [phone].pdf Page 2 of 6 Signed: 02/08/2023 10:03:28 AM By: Baltazar Najjar MD Entered By: Baltazar Najjar on 02/07/2023 13:22:05 -------------------------------------------------------------------------------- Physician Orders  Details Patient Name: Date of Service: [name]. 02/07/2023 2:30 PM Medical Record Number:  [id number] Patient Account Number: [id number] Date of Birth/Sex: Treating RN: [birth year] (77 y.o. [name], Bonita Quin Primary Care Provider: Oliver Barre Other Clinician: Referring Provider: Treating Provider/Extender: Carley Hammed in Treatment: 7 The following information was scribed by: Zenaida Deed The information was scribed for: Baltazar Najjar Verbal / Phone Orders: No Diagnosis Coding ICD-10 Coding Code Description [phone] Non-pressure chronic ulcer of other part of right foot with fat layer exposed E11.621 Type 2 diabetes mellitus with foot ulcer I73.9 Peripheral vascular disease, unspecified E11.49 Type 2 diabetes mellitus with other diabetic neurological complication L84 Corns and callosities Follow-up Appointments ppointment in 2 weeks. - Dr. Lady Gary Return A Anesthetic (In clinic) Topical Lidocaine 5% applied to wound bed Bathing/ Shower/ Hygiene May shower and wash wound with soap and water. - After showering/Bathing, if you choose, you may perform the wound dressing changes Off-Loading Wound #1 Right,Medial,Plantar Foot Wedge shoe to: - right foot - wear when walking, try to keep pressure off wound area at all times Wound Treatment Wound #1 - Foot Wound Laterality: Plantar, Right, Medial Cleanser: Soap and Water 1 x Per Day/15 Days Discharge Instructions: May shower and wash wound with dial antibacterial soap and water prior to dressing change. Cleanser: Vashe 5.8 (oz) 1 x Per Day/15 Days Discharge Instructions: Or Cleanse the wound with Vashe prior to applying a clean dressing using gauze sponges, not tissue or cotton balls. Peri-Wound Care: Zinc Oxide Ointment 30g tube 1 x Per Day/15 Days Discharge Instructions: Apply Zinc Oxide to periwound with each dressing change Topical: Gentamicin 1 x Per Day/15 Days Discharge Instructions: As directed by  physician Topical: Mupirocin Ointment 1 x Per Day/15 Days Discharge Instructions: Apply Mupirocin (Bactroban) as instructed Prim Dressing: Maxorb Extra Ag+ Alginate Dressing, 2x2 (in/in) (Generic) 1 x Per Day/15 Days ary Discharge Instructions: Apply to wound bed as instructed Secondary Dressing: Optifoam Non-Adhesive Dressing, 4x4 in 1 x Per Day/15 Days Discharge Instructions: Apply over primary dressing cut to make foam donut Secondary Dressing: Zetuvit Plus Silicone Border Dressing 4x4 (in/in) (Dispense As Written) 1 x Per Day/15 Days Discharge Instructions: Apply silicone border over primary dressing as directed. [name], [name] ([id number]) [id number] Page 3 of 6 Electronic Signature(s) Signed: 02/07/2023 4:57:48 PM By: Zenaida Deed RN, BSN Signed: 02/08/2023 10:03:28 AM By: Baltazar Najjar MD Entered By: Zenaida Deed on 02/07/2023 11:59:15 -------------------------------------------------------------------------------- Problem List Details Patient Name: Date of Service: [name]. 02/07/2023 2:30 PM Medical Record Number: [id number] Patient Account Number: [id number] Date of Birth/Sex: Treating RN: [birth year] (77 y.o. Damaris Schooner Primary Care Provider: Oliver Barre Other Clinician: Referring Provider: Treating Provider/Extender: Carley Hammed in Treatment: 7 Active Problems ICD-10 Encounter Code Description Active Date MDM Diagnosis [phone] Non-pressure chronic ulcer of other part of right foot with fat layer exposed 12/16/2022 No Yes E11.621 Type 2 diabetes mellitus with foot ulcer 12/16/2022 No Yes I73.9 Peripheral vascular disease, unspecified 12/16/2022 No Yes E11.49 Type 2 diabetes mellitus with other diabetic neurological complication 12/16/2022 No Yes Inactive Problems ICD-10 Code Description Active Date Inactive Date L84 Corns and callosities 12/16/2022 12/16/2022 Resolved Problems ICD-10 Code  Description Active Date Resolved Date L97.521 Non-pressure chronic ulcer of other part of left foot limited to breakdown of skin 12/16/2022 12/16/2022 Electronic Signature(s) Signed: 02/08/2023 10:03:28 AM By: Baltazar Najjar MD Entered By: Baltazar Najjar on 02/07/2023 13:19:57 Progress Note Details -------------------------------------------------------------------------------- [name] ([id number]) [phone].pdf Page 4 of 6 Patient Name: Date of Service: [name], [name]. 02/07/2023 2:30 PM Medical  Record Number: [id number] Patient Account Number: [id number] Date of Birth/Sex: Treating RN: 11-09-1945 (77 y.o. M) Primary Care Provider: Oliver Barre Other Clinician: Referring Provider: Treating Provider/Extender: Carley Hammed in Treatment: 7 Subjective History of Present Illness (HPI) ADMISSION 12/16/2022 ***ABIs [name]: 0.95; R: 0.88*** This is a 77 year old type II diabetic (last hemoglobin A1c 7.2% on November 20, 2022) with a fairly extensive cardiac and peripheral vascular history. 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 Most recently, he has been followed by a podiatrist in [place] for right and left diabetic foot ulcers on the first metatarsal head. He says they were at first applying some kind of cream, the name of which he does not remember and have now been applying what he describes as a "some white stuff." Due to failure of the wounds to heal, he was referred to the wound care center for further evaluation and management. 12/29/2022: The left foot wound is healed. The right first metatarsal head wound is surrounded with macerated callus and skin. There is some slough on the surface. 01/12/2023: The tissue maceration is much better this week. There is an odor coming from the wound, however. The wound is stable in size. 01/19/2023: The culture that I took last week grew out multiple  species, including Enterococcus, strep, staph, and small amounts of Pseudomonas. I prescribed Augmentin and he is currently taking this. The wound itself is looking a little bit better. Less maceration and a healthier appearing surface. The odor has abated. 01/24/2023: The wound is about the same size today, but the overall appearance is improved. There is no tissue maceration. The surface has a layer of slough overlying the granulation tissue. No malodor or purulent drainage. 12/5; the patient has a small wound on the right first metatarsal head. There is nothing open on the left side. This is a diabetic foot ulcer we have been using gentamicin mupirocin and silver alginate. 12/10; this is a small wound on the right first metatarsal head. Unfortunately not much different this week. 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 We have been using gent, mupirocin, silver alginate. Objective Constitutional Patient is hypotensive But he appears well. Vitals Time Taken: 2:34 PM, Height: 70 in, Weight: 186 lbs, BMI: 26.7, Temperature: 97.9 F, Pulse: 83 bpm, Respiratory Rate: 18 breaths/min, Blood Pressure: 95/61 mmHg, Capillary Blood Glucose: 181 mg/dl. General Notes: glucose per pt's meter at present General Notes: Wound exam; the patient has a clean well granulated wound. There is a slight amount of undermining and a slight amount of callus buildup. But I did not debride any of this preferring to see if we can get any additional granulation. If this still stalls next week it will likely need a debridement. There is no evidence of infection this does not probe to bone Integumentary (Hair, Skin) Wound #1 status is Open. Original cause of wound was Gradually Appeared. The date acquired was: 05/30/2022. The wound has been in treatment 7 weeks. The wound is located on the Right,Medial,Plantar Foot. The wound measures 0.7cm length x  0.8cm width x 0.2cm depth; 0.44cm^2 area and 0.088cm^3 volume. There is Fat Layer (Subcutaneous Tissue) exposed. There is no tunneling noted, however, there is undermining starting at 2:00 and ending at 4:00 with a maximum distance of 0.2cm. There is a medium amount of serosanguineous drainage noted. The wound margin is thickened. There is large (67-100%) red,  pink granulation within the wound bed. There is a small (1-33%) amount of necrotic tissue within the wound bed including Adherent Slough. The periwound skin appearance had no abnormalities noted for color. The periwound skin appearance exhibited: Callus, Maceration. The periwound skin appearance did not exhibit: Dry/Scaly. Periwound temperature was noted as No Abnormality. Assessment Active Problems ICD-10 Non-pressure chronic ulcer of other part of right foot with fat layer exposed Type 2 diabetes mellitus with foot ulcer Peripheral vascular disease, unspecified Type 2 diabetes mellitus with other diabetic neurological complication [name], [name] ([id number]) [phone].pdf Page 5 of 6 Plan Follow-up Appointments: Return Appointment in 2 weeks. - Dr. Lady Gary Anesthetic: (In clinic) Topical Lidocaine 5% applied to wound bed Bathing/ Shower/ Hygiene: May shower and wash wound with soap and water. - After showering/Bathing, if you choose, you may perform the wound dressing changes Off-Loading: Wound #1 Right,Medial,Plantar Foot: Wedge shoe to: - right foot - wear when walking, try to keep pressure off wound area at all times WOUND #1: - Foot Wound Laterality: Plantar, Right, Medial Cleanser: Soap and Water 1 x Per Day/15 Days Discharge Instructions: May shower and wash wound with dial antibacterial soap and water prior to dressing change. Cleanser: Vashe 5.8 (oz) 1 x Per Day/15 Days Discharge Instructions: Or Cleanse the wound with Vashe prior to applying a clean dressing using gauze sponges, not tissue or  cotton balls. Peri-Wound Care: Zinc Oxide Ointment 30g tube 1 x Per Day/15 Days Discharge Instructions: Apply Zinc Oxide to periwound with each dressing change Topical: Gentamicin 1 x Per Day/15 Days Discharge Instructions: As directed by physician Topical: Mupirocin Ointment 1 x Per Day/15 Days Discharge Instructions: Apply Mupirocin (Bactroban) as instructed Prim Dressing: Maxorb Extra Ag+ Alginate Dressing, 2x2 (in/in) (Generic) 1 x Per Day/15 Days ary Discharge Instructions: Apply to wound bed as instructed Secondary Dressing: Optifoam Non-Adhesive Dressing, 4x4 in 1 x Per Day/15 Days Discharge Instructions: Apply over primary dressing cut to make foam donut Secondary Dressing: Zetuvit Plus Silicone Border Dressing 4x4 (in/in) (Dispense As Written) 1 x Per Day/15 Days Discharge Instructions: Apply silicone border over primary dressing as directed. 1. Right medial plantar foot. No debridement today 2. Still the same dressing topical antibiotic silver alginate. There is cream granulated base 3. He seems to be using a forefoot off loader adequately. I watched him walk in the clinic. We talked about the option would which would be a total contact cast Electronic Signature(s) Signed: 02/08/2023 10:03:28 AM By: Baltazar Najjar MD Entered By: Baltazar Najjar on 02/07/2023 13:24:48 -------------------------------------------------------------------------------- SuperBill Details Patient Name: Date of Service: [name]. 02/07/2023 Medical Record Number: [id number] Patient Account Number: [id number] Date of Birth/Sex: Treating RN: 1945/10/13 (77 y.o. Damaris Schooner Primary Care Provider: Oliver Barre Other Clinician: Referring Provider: Treating Provider/Extender: Carley Hammed in Treatment: 7 Diagnosis Coding ICD-10 Codes Code Description [phone] Non-pressure chronic ulcer of other part of right foot with fat layer exposed E11.621 Type 2 diabetes mellitus  with foot ulcer I73.9 Peripheral vascular disease, unspecified E11.49 Type 2 diabetes mellitus with other diabetic neurological complication L84 Corns and callosities Facility Procedures : CPT4 Code: [id number] Description: 99213 - WOUND CARE VISIT-LEV 3 EST PT Modifier: Quantity: 1 Physician Procedures : CPT4 Code Description Modifier [id number] - WC PHYS LEVEL 3 - EST PT ICD-10 Diagnosis Description [name], [name] ([id number]) [phone]. R44.315 Non-pressure chronic ulcer of other part of right foot with fat layer exposed  E11.621 Type 2 diabetes mellitus with foot ulcer Quantity: 1  pdf Page 6 of 6 Electronic Signature(s) Signed: 02/08/2023 10:03:28 AM By: Baltazar Najjar MD Entered By: Baltazar Najjar on 02/07/2023 13:25:09

## 2023-02-20 ENCOUNTER — Encounter (HOSPITAL_BASED_OUTPATIENT_CLINIC_OR_DEPARTMENT_OTHER): Payer: Medicare Other | Admitting: General Surgery

## 2023-02-20 DIAGNOSIS — Z792 Long term (current) use of antibiotics: Secondary | ICD-10-CM | POA: Diagnosis not present

## 2023-02-20 DIAGNOSIS — L97512 Non-pressure chronic ulcer of other part of right foot with fat layer exposed: Secondary | ICD-10-CM | POA: Diagnosis not present

## 2023-02-20 DIAGNOSIS — I110 Hypertensive heart disease with heart failure: Secondary | ICD-10-CM | POA: Diagnosis not present

## 2023-02-20 DIAGNOSIS — E1149 Type 2 diabetes mellitus with other diabetic neurological complication: Secondary | ICD-10-CM | POA: Diagnosis not present

## 2023-02-20 DIAGNOSIS — E1151 Type 2 diabetes mellitus with diabetic peripheral angiopathy without gangrene: Secondary | ICD-10-CM | POA: Diagnosis not present

## 2023-02-20 DIAGNOSIS — I509 Heart failure, unspecified: Secondary | ICD-10-CM | POA: Diagnosis not present

## 2023-02-20 DIAGNOSIS — L84 Corns and callosities: Secondary | ICD-10-CM | POA: Diagnosis not present

## 2023-02-20 DIAGNOSIS — Z89431 Acquired absence of right foot: Secondary | ICD-10-CM | POA: Diagnosis not present

## 2023-02-20 DIAGNOSIS — E11621 Type 2 diabetes mellitus with foot ulcer: Secondary | ICD-10-CM | POA: Diagnosis not present

## 2023-02-21 DIAGNOSIS — I5042 Chronic combined systolic (congestive) and diastolic (congestive) heart failure: Secondary | ICD-10-CM | POA: Diagnosis not present

## 2023-02-21 NOTE — Progress Notes (Signed)
OLLIN, RITTS ([id number]) [id number] Page 1 of 7 Visit Report for 02/20/2023 Arrival Information Details Patient Name: Date of Service: LARRY, BARTOS MES L. 02/20/2023 2:00 PM Medical Record Number: [id number] Patient Account Number: [id number] Date of Birth/Sex: Treating RN: 21-May-1945 (77 y.o. M) Primary Care Caroline Longie: Oliver Barre Other Clinician: Referring Saylor Murry: Treating Legacy Lacivita/Extender: Joeseph Amor in Treatment: 9 Visit Information History Since Last Visit Added or deleted any medications: No Patient Arrived: Ambulatory Any new allergies or adverse reactions: No Arrival Time: 13:47 Had a fall or experienced change in No Accompanied By: self activities of daily living that may affect Transfer Assistance: None risk of falls: Patient Identification Verified: Yes Signs or symptoms of abuse/neglect since last visito No Secondary Verification Process Completed: Yes Hospitalized since last visit: No Patient Requires Transmission-Based Precautions: No Implantable device outside of the clinic excluding No Patient Has Alerts: Yes cellular tissue based products placed in the center Patient Alerts: Patient on Blood Thinner since last visit: Xarelto Has Dressing in Place as Prescribed: Yes Has Footwear/Offloading in Place as Prescribed: Yes Right: Wedge Shoe Pain Present Now: No Electronic Signature(s) Signed: 02/20/2023 4:30:16 PM By: Zenaida Deed RN, BSN Entered By: Zenaida Deed on 02/20/2023 13:57:35 -------------------------------------------------------------------------------- Encounter Discharge Information Details Patient Name: Date of Service: [name] MES L. 02/20/2023 2:00 PM Medical Record Number: [id number] Patient Account Number: [id number] Date of Birth/Sex: Treating RN: 03/25/1945 (77 y.o. Damaris Schooner Primary Care Oneal Schoenberger: Oliver Barre Other Clinician: Referring Ellenie Salome: Treating Verna Desrocher/Extender:  Joeseph Amor in Treatment: 9 Encounter Discharge Information Items Post Procedure Vitals Discharge Condition: Stable Temperature (F): 97.4 Ambulatory Status: Ambulatory Pulse (bpm): 84 Discharge Destination: Home Respiratory Rate (breaths/min): 18 Transportation: Private Auto Blood Pressure (mmHg): 98/69 Accompanied By: self Schedule Follow-up Appointment: Yes Clinical Summary of Care: Patient Declined Electronic Signature(s) Signed: 02/20/2023 4:30:16 PM By: Zenaida Deed RN, BSN Entered By: Zenaida Deed on 02/20/2023 14:29:31 [place], [name] ([id number]) [id number] Page 2 of 7 -------------------------------------------------------------------------------- Lower Extremity Assessment Details Patient Name: Date of Service: KINDALL, KENNEL MES L. 02/20/2023 2:00 PM Medical Record Number: [id number] Patient Account Number: [id number] Date of Birth/Sex: Treating RN: [birth year] (77 y.o. Damaris Schooner Primary Care Addilynn Mowrer: Oliver Barre Other Clinician: Referring Elie Leppo: Treating Lakeita Panther/Extender: Joeseph Amor in Treatment: 9 Edema Assessment Assessed: Kyra Searles: No] [Right: No] Edema: [Left: N] [Right: o] Calf Left: Right: Point of Measurement: 29 cm From Medial Instep 35.5 cm Ankle Left: Right: Point of Measurement: 10 cm From Medial Instep 21 cm Vascular Assessment Pulses: Dorsalis Pedis Palpable: [Right:Yes] Extremity colors, hair growth, and conditions: Extremity Color: [Right:Normal] Hair Growth on Extremity: [Right:Yes] Temperature of Extremity: [Right:Warm] Capillary Refill: [Right:< 3 seconds] Dependent Rubor: [Right:No No] Electronic Signature(s) Signed: 02/20/2023 4:30:16 PM By: Zenaida Deed RN, BSN Entered By: Zenaida Deed on 02/20/2023 13:59:41 -------------------------------------------------------------------------------- Multi Wound Chart Details Patient Name: Date of  Service: [name] MES L. 02/20/2023 2:00 PM Medical Record Number: [id number] Patient Account Number: [id number] Date of Birth/Sex: Treating RN: October 06, 1945 (77 y.o. M) Primary Care Makayli Bracken: Oliver Barre Other Clinician: Referring Brentton Wardlow: Treating Latreece Mochizuki/Extender: Joeseph Amor in Treatment: 9 Vital Signs Height(in): 70 Pulse(bpm): 84 Weight(lbs): 186 Blood Pressure(mmHg): 98/69 Body Mass Index(BMI): 26.7 Temperature(F): 97.4 Respiratory Rate(breaths/min): 18 [1:Photos:] [N/A:N/A] Right, Medial, Plantar Foot N/A N/A Wound Location: Gradually Appeared N/A N/A Wounding Event: Diabetic Wound/Ulcer of the Lower N/A N/A Primary Etiology: Extremity Cataracts, Arrhythmia, Congestive N/A N/A Comorbid History: Heart Failure, Coronary Artery Disease, Hypertension, Type II Diabetes,  Neuropathy 05/30/2022 N/A N/A Date Acquired: 9 N/A N/A Weeks of Treatment: Open N/A N/A Wound Status: No N/A N/A Wound Recurrence: 0.6x0.6x0.1 N/A N/A Measurements L x W x D (cm) 0.283 N/A N/A A (cm) : rea 0.028 N/A N/A Volume (cm) : 72.30% N/A N/A % Reduction in A rea: 90.80% N/A N/A % Reduction in Volume: 4 Starting Position 1 (o'clock): 6 Ending Position 1 (o'clock): 0.2 Maximum Distance 1 (cm): Yes N/A N/A Undermining: Grade 2 N/A N/A Classification: Medium N/A N/A Exudate A mount: Serosanguineous N/A N/A Exudate Type: red, brown N/A N/A Exudate Color: Thickened N/A N/A Wound Margin: Large (67-100%) N/A N/A Granulation A mount: Red, Pink N/A N/A Granulation Quality: Small (1-33%) N/A N/A Necrotic A mount: Fat Layer (Subcutaneous Tissue): Yes N/A N/A Exposed Structures: Fascia: No Tendon: No Muscle: No Joint: No Bone: No Small (1-33%) N/A N/A Epithelialization: Debridement - Excisional N/A N/A Debridement: Pre-procedure Verification/Time Out 14:05 N/A N/A Taken: Lidocaine 5% topical ointment N/A N/A Pain Control: Callus, Subcutaneous,  Slough N/A N/A Tissue Debrided: Skin/Subcutaneous Tissue N/A N/A Level: 0.57 N/A N/A Debridement A (sq cm): rea Curette N/A N/A Instrument: Minimum N/A N/A Bleeding: Pressure N/A N/A Hemostasis A chieved: 0 N/A N/A Procedural Pain: 0 N/A N/A Post Procedural Pain: Procedure was tolerated well N/A N/A Debridement Treatment Response: 0.6x0.6x0.1 N/A N/A Post Debridement Measurements L x W x D (cm) 0.028 N/A N/A Post Debridement Volume: (cm) Callus: Yes N/A N/A Periwound Skin Texture: Maceration: Yes N/A N/A Periwound Skin Moisture: Dry/Scaly: No No Abnormalities Noted N/A N/A Periwound Skin Color: No Abnormality N/A N/A Temperature: Debridement N/A N/A Procedures Performed: Treatment Notes Electronic Signature(s) Signed: 02/20/2023 4:28:38 PM By: Duanne Guess MD FACS Entered By: Duanne Guess on 02/20/2023 14:25:05 -------------------------------------------------------------------------------- Multi-Disciplinary Care Plan Details Patient Name: Date of Service: [name] MES L. 02/20/2023 2:00 PM Johnney Killian ([id number]) [id number] Page 4 of 7 Medical Record Number: [id number] Patient Account Number: [id number] Date of Birth/Sex: Treating RN: 01-Dec-1945 (77 y.o. Damaris Schooner Primary Care Ranulfo Kall: Oliver Barre Other Clinician: Referring Dorsel Flinn: Treating Averey Trompeter/Extender: Joeseph Amor in Treatment: 9 Multidisciplinary Care Plan reviewed with physician Active Inactive Nutrition Nursing Diagnoses: Impaired glucose control: actual or potential Potential for alteration in Nutrition/Potential for imbalanced nutrition Goals: Patient/caregiver will maintain therapeutic glucose control Date Initiated: 02/20/2023 Target Resolution Date: 03/21/2023 Goal Status: Active Interventions: Assess patient nutrition upon admission and as needed per policy Provide education on elevated blood sugars and impact on  wound healing Treatment Activities: Patient referred to Primary Care Physician for further nutritional evaluation : 02/20/2023 Notes: Wound/Skin Impairment Nursing Diagnoses: Impaired tissue integrity Goals: Patient/caregiver will verbalize understanding of skin care regimen Date Initiated: 12/16/2022 Target Resolution Date: 02/28/2023 Goal Status: Active Interventions: Assess ulceration(s) every visit Treatment Activities: Skin care regimen initiated : 12/16/2022 Notes: Electronic Signature(s) Signed: 02/20/2023 4:30:16 PM By: Zenaida Deed RN, BSN Entered By: Zenaida Deed on 02/20/2023 14:02:00 -------------------------------------------------------------------------------- Pain Assessment Details Patient Name: Date of Service: [name] MES L. 02/20/2023 2:00 PM Medical Record Number: [id number] Patient Account Number: [id number] Date of Birth/Sex: Treating RN: [birth year] (77 y.o. M) Primary Care Tarshia Kot: Oliver Barre Other Clinician: Referring Chyanna Flock: Treating Kendyll Huettner/Extender: Joeseph Amor in Treatment: 9 Active Problems Location of Pain Severity and Description of Pain Patient Has Paino No Site Locations Rate the pain. ALGIN, OSEN ([id number]) [id number] Page 5 of 7 Rate the pain. Current Pain Level: 0 Pain Management and Medication Current Pain Management: Electronic Signature(s) Signed: 02/20/2023 4:30:16  PM By: Zenaida Deed RN, BSN Entered By: Zenaida Deed on 02/20/2023 13:58:17 -------------------------------------------------------------------------------- Patient/Caregiver Education Details Patient Name: Date of Service: [name] MES L. 12/23/2024andnbsp2:00 PM Medical Record Number: [id number] Patient Account Number: [id number] Date of Birth/Gender: Treating RN: Dec 27, 1945 (77 y.o. Damaris Schooner Primary Care Physician: Oliver Barre Other Clinician: Referring Physician: Treating  Physician/Extender: Joeseph Amor in Treatment: 9 Education Assessment Education Provided To: Patient Education Topics Provided Elevated Blood Sugar/ Impact on Healing: Methods: Explain/Verbal Responses: Reinforcements needed, State content correctly Offloading: Methods: Explain/Verbal Responses: Reinforcements needed, State content correctly Wound/Skin Impairment: Methods: Explain/Verbal Responses: Reinforcements needed, State content correctly Electronic Signature(s) Signed: 02/20/2023 4:30:16 PM By: Zenaida Deed RN, BSN Entered By: Zenaida Deed on 02/20/2023 14:02:30 Johnney Killian ([id number]) [id number] Page 6 of 7 -------------------------------------------------------------------------------- Wound Assessment Details Patient Name: Date of Service: EWALD, DUCKER MES L. 02/20/2023 2:00 PM Medical Record Number: [id number] Patient Account Number: [id number] Date of Birth/Sex: Treating RN: Sep 13, 1945 (77 y.o. M) Primary Care Aldin Drees: Oliver Barre Other Clinician: Referring Makenleigh Crownover: Treating Mordechai Matuszak/Extender: Joeseph Amor in Treatment: 9 Wound Status Wound Number: 1 Primary Diabetic Wound/Ulcer of the Lower Extremity Etiology: Wound Location: Right, Medial, Plantar Foot Wound Open Wounding Event: Gradually Appeared Status: Date Acquired: 05/30/2022 Comorbid Cataracts, Arrhythmia, Congestive Heart Failure, Coronary Artery Weeks Of Treatment: 9 History: Disease, Hypertension, Type II Diabetes, Neuropathy Clustered Wound: No Photos Wound Measurements Length: (cm) 0.6 Width: (cm) 0.6 Depth: (cm) 0.1 Area: (cm) 0.283 Volume: (cm) 0.028 % Reduction in Area: 72.3% % Reduction in Volume: 90.8% Epithelialization: Small (1-33%) Tunneling: No Undermining: Yes Starting Position (o'clock): 4 Ending Position (o'clock): 6 Maximum Distance: (cm) 0.2 Wound Description Classification: Grade 2 Wound  Margin: Thickened Exudate Amount: Medium Exudate Type: Serosanguineous Exudate Color: red, brown Foul Odor After Cleansing: No Slough/Fibrino Yes Wound Bed Granulation Amount: Large (67-100%) Exposed Structure Granulation Quality: Red, Pink Fascia Exposed: No Necrotic Amount: Small (1-33%) Fat Layer (Subcutaneous Tissue) Exposed: Yes Necrotic Quality: Adherent Slough Tendon Exposed: No Muscle Exposed: No Joint Exposed: No Bone Exposed: No Periwound Skin Texture Texture Color No Abnormalities Noted: No No Abnormalities Noted: Yes Callus: Yes Temperature / Pain Temperature: No Abnormality Moisture No Abnormalities Noted: No Dry / Scaly: No MacerationROLAND, LEMMEN L ([id number]) [phone].pdf Page 7 of 7 Treatment Notes Wound #1 (Foot) Wound Laterality: Plantar, Right, Medial Cleanser Soap and Water Discharge Instruction: May shower and wash wound with dial antibacterial soap and water prior to dressing change. Vashe 5.8 (oz) Discharge Instruction: Or Cleanse the wound with Vashe prior to applying a clean dressing using gauze sponges, not tissue or cotton balls. Peri-Wound Care Zinc Oxide Ointment 30g tube Discharge Instruction: Apply Zinc Oxide to periwound with each dressing change Topical Gentamicin Discharge Instruction: As directed by physician Mupirocin Ointment Discharge Instruction: Apply Mupirocin (Bactroban) as instructed Primary Dressing Maxorb Extra Ag+ Alginate Dressing, 2x2 (in/in) Discharge Instruction: Apply to wound bed as instructed Secondary Dressing Optifoam Non-Adhesive Dressing, 4x4 in Discharge Instruction: Apply over primary dressing cut to make foam donut Zetuvit Plus Silicone Border Dressing 4x4 (in/in) Discharge Instruction: Apply silicone border over primary dressing as directed. Secured With Compression Wrap Compression Stockings Facilities manager) Signed: 02/20/2023 4:30:16 PM By: Zenaida Deed RN, BSN Entered By: Zenaida Deed on 02/20/2023 14:00:24 -------------------------------------------------------------------------------- Vitals Details Patient Name: Date of Service: [name] MES L. 02/20/2023 2:00 PM Medical Record Number: [id number] Patient Account Number: [id number] Date of Birth/Sex: Treating RN: 09-11-1945 (77 y.o. M) Primary Care Yuval Nolet: Oliver Barre  Other Clinician: Referring Marisue Canion: Treating Samael Blades/Extender: Joeseph Amor in Treatment: 9 Vital Signs Time Taken: 01:48 Temperature (F): 97.4 Height (in): 70 Pulse (bpm): 84 Weight (lbs): 186 Respiratory Rate (breaths/min): 18 Body Mass Index (BMI): 26.7 Blood Pressure (mmHg): 98/69 Reference Range: 80 - 120 mg / dl Electronic Signature(s) Signed: 02/20/2023 4:30:16 PM By: Zenaida Deed RN, BSN Entered By: Zenaida Deed on 02/20/2023 13:58:08

## 2023-02-21 NOTE — Progress Notes (Signed)
[name], [name] ([id number]) [id number] Page 1 of 7 Visit Report for 02/20/2023 Chief Complaint Document Details Patient Name: Date of Service: [name]. 02/20/2023 2:00 PM Medical Record Number: [id number] Patient Account Number: [id number] Date of Birth/Sex: Treating RN: [name] (77 y.o. M) Primary Care Provider: Oliver [name] Other Clinician: Referring Provider: Treating Provider/Extender: [name] in Treatment: 9 Information Obtained from: Patient Chief Complaint Patients presents for treatment of an open diabetic ulcer Electronic Signature(s) Signed: 02/20/2023 4:28:38 PM By: Duanne Guess MD FACS Entered By: Duanne Guess on 02/20/2023 14:25:11 -------------------------------------------------------------------------------- Debridement Details Patient Name: Date of Service: [name]. 02/20/2023 2:00 PM Medical Record Number: [id number] Patient Account Number: [id number] Date of Birth/Sex: Treating RN: [name], [name] (77 y.o. [name], Bonita Quin Primary Care Provider: Oliver [name] Other Clinician: Referring Provider: Treating Provider/Extender: [name] in Treatment: 9 Debridement Performed for Assessment: Wound #1 Right,Medial,Plantar Foot Performed By: Physician Duanne Guess, MD The following information was scribed by: Zenaida Deed The information was scribed for: Duanne Guess Debridement Type: Debridement Severity of Tissue Pre Debridement: Fat layer exposed Level of Consciousness (Pre-procedure): Awake and Alert Pre-procedure Verification/Time Out Yes - 14:05 Taken: Start Time: 14:07 Pain Control: Lidocaine 5% topical ointment Percent of Wound Bed Debrided: 200% T Area Debrided (cm): otal 0.57 Tissue and other material debrided: Viable, Non-Viable, Callus, Slough, Subcutaneous, Skin: Epidermis, Slough Level: Skin/Subcutaneous Tissue Debridement Description:  Excisional Instrument: Curette Bleeding: Minimum Hemostasis Achieved: Pressure Procedural Pain: 0 Post Procedural Pain: 0 Response to Treatment: Procedure was tolerated well Level of Consciousness (Post- Awake and Alert procedure): Post Debridement Measurements of Total Wound Length: (cm) 0.6 Width: (cm) 0.6 Depth: (cm) 0.1 Volume: (cm) 0.028 [name], [name] ([id number]) [id number] Page 2 of 7 Character of Wound/Ulcer Post Debridement: Stable Severity of Tissue Post Debridement: Fat layer exposed Post Procedure Diagnosis Same as Pre-procedure Electronic Signature(s) Signed: 02/20/2023 4:28:38 PM By: Duanne Guess MD FACS Signed: 02/20/2023 4:30:16 PM By: Zenaida Deed RN, BSN Entered By: Zenaida Deed on 02/20/2023 14:09:17 -------------------------------------------------------------------------------- HPI Details Patient Name: Date of Service: [name]. 02/20/2023 2:00 PM Medical Record Number: [id number] Patient Account Number: [id number] Date of Birth/Sex: Treating RN: [name] (77 y.o. M) Primary Care Provider: Oliver [name] Other Clinician: Referring Provider: Treating Provider/Extender: [name] in Treatment: 9 History of Present Illness HPI Description: ADMISSION 12/16/2022 ***ABIs [name]: 0.95; R: 0.88*** This is a 77 year old type II diabetic (last hemoglobin A1c 7.2% on November 20, 2022) with a fairly extensive cardiac and peripheral vascular history. 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 Most recently, he has been followed by a podiatrist in [place] for right and left diabetic foot ulcers on the first metatarsal head. He says they were at first applying some kind of cream, the name of which he does not remember and have now been applying what he describes as a "some white stuff." Due to failure of the wounds to heal, he was referred to the wound care center  for further evaluation and management. 12/29/2022: The left foot wound is healed. The right first metatarsal head wound is surrounded with macerated callus and skin. There is some slough on the surface. 01/12/2023: The tissue maceration is much better this week. There is an odor coming from the wound, however. The wound is stable in size. 01/19/2023: The culture that I took last week grew out multiple species, including Enterococcus, strep, staph, and small amounts of Pseudomonas. I prescribed  Augmentin and he is currently taking this. The wound itself is looking a little bit better. Less maceration and a healthier appearing surface. The odor has abated. 01/24/2023: The wound is about the same size today, but the overall appearance is improved. There is no tissue maceration. The surface has a layer of slough overlying the granulation tissue. No malodor or purulent drainage. 12/5; the patient has a small wound on the right first metatarsal head. There is nothing open on the left side. This is a diabetic foot ulcer we have been using gentamicin mupirocin and silver alginate. 12/10; this is a small wound on the right first metatarsal head. Unfortunately not much different this week. 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 We have been using gent, mupirocin, silver alginate. 02/20/2023: No significant change in the wound dimensions. There is a fair amount of callus accumulation this week. His forefoot offloading shoe is pretty battered. Electronic Signature(s) Signed: 02/20/2023 4:28:38 PM By: Duanne Guess MD FACS Entered By: Duanne Guess on 02/20/2023 14:26:04 -------------------------------------------------------------------------------- Physical Exam Details Patient Name: Date of Service: [name]. 02/20/2023 2:00 PM Medical Record Number: [id number] Patient Account Number: [id number] Date of  Birth/Sex: Treating RN: [name] (77 y.o. M) Primary Care Provider: Oliver [name] Other Clinician: [name] ([id number]) [id number] Page 3 of 7 Referring Provider: Treating Provider/Extender: [name] in Treatment: 9 Constitutional Hypotensive, but normal for this patient. . . . no acute distress. Respiratory Normal work of breathing on room air.. Notes 02/20/2023: No significant change in the wound dimensions. There is a fair amount of callus accumulation this week. Electronic Signature(s) Signed: 02/20/2023 4:28:38 PM By: Duanne Guess MD FACS Entered By: Duanne Guess on 02/20/2023 14:27:09 -------------------------------------------------------------------------------- Physician Orders Details Patient Name: Date of Service: [name]. 02/20/2023 2:00 PM Medical Record Number: [id number] Patient Account Number: [id number] Date of Birth/Sex: Treating RN: [birth year] (77 y.o. [name] Primary Care Provider: Oliver [name] Other Clinician: Referring Provider: Treating Provider/Extender: [name] in Treatment: 9 The following information was scribed by: Zenaida Deed The information was scribed for: Duanne Guess Verbal / Phone Orders: No Diagnosis Coding ICD-10 Coding Code Description [phone] Non-pressure chronic ulcer of other part of right foot with fat layer exposed E11.621 Type 2 diabetes mellitus with foot ulcer I73.9 Peripheral vascular disease, unspecified E11.49 Type 2 diabetes mellitus with other diabetic neurological complication Follow-up Appointments ppointment in 1 week. - 1-2 weeks with Dr. Lady Gary Return A Anesthetic (In clinic) Topical Lidocaine 5% applied to wound bed Bathing/ Shower/ Hygiene May shower and wash wound with soap and water. - with dressing changes Off-Loading Wound #1 Right,Medial,Plantar Foot Wedge shoe to: - right foot - wear when  walking, try to keep pressure off wound area at all times Wound Treatment Wound #1 - Foot Wound Laterality: Plantar, Right, Medial Cleanser: Soap and Water 1 x Per Day/30 Days Discharge Instructions: May shower and wash wound with dial antibacterial soap and water prior to dressing change. Cleanser: Vashe 5.8 (oz) 1 x Per Day/30 Days Discharge Instructions: Or Cleanse the wound with Vashe prior to applying a clean dressing using gauze sponges, not tissue or cotton balls. Peri-Wound Care: Zinc Oxide Ointment 30g tube 1 x Per Day/30 Days Discharge Instructions: Apply Zinc Oxide to periwound with each dressing change Topical: Gentamicin 1 x Per Day/30 Days Discharge Instructions: As directed by physician Topical: Mupirocin Ointment  1 x Per Day/30 Days [name], [name] ([id number]) [phone].pdf Page 4 of 7 Discharge Instructions: Apply Mupirocin (Bactroban) as instructed Prim Dressing: Maxorb Extra Ag+ Alginate Dressing, 2x2 (in/in) (Generic) 1 x Per Day/30 Days ary Discharge Instructions: Apply to wound bed as instructed Secondary Dressing: Optifoam Non-Adhesive Dressing, 4x4 in 1 x Per Day/30 Days Discharge Instructions: Apply over primary dressing cut to make foam donut Secondary Dressing: Zetuvit Plus Silicone Border Dressing 4x4 (in/in) (DME) (Dispense As Written) 1 x Per Day/30 Days Discharge Instructions: Apply silicone border over primary dressing as directed. Electronic Signature(s) Signed: 02/20/2023 4:28:38 PM By: Duanne Guess MD FACS Entered By: Duanne Guess on 02/20/2023 14:27:26 -------------------------------------------------------------------------------- Problem List Details Patient Name: Date of Service: [name]. 02/20/2023 2:00 PM Medical Record Number: [id number] Patient Account Number: [id number] Date of Birth/Sex: Treating RN: [birth year] (77 y.o. [name] Primary Care Provider: Oliver [name] Other Clinician: Referring  Provider: Treating Provider/Extender: [name] in Treatment: 9 Active Problems ICD-10 Encounter Code Description Active Date MDM Diagnosis L97.512 Non-pressure chronic ulcer of other part of right foot with fat layer exposed 12/16/2022 No Yes E11.621 Type 2 diabetes mellitus with foot ulcer 12/16/2022 No Yes I73.9 Peripheral vascular disease, unspecified 12/16/2022 No Yes E11.49 Type 2 diabetes mellitus with other diabetic neurological complication 12/16/2022 No Yes Inactive Problems ICD-10 Code Description Active Date Inactive Date L84 Corns and callosities 12/16/2022 12/16/2022 Resolved Problems ICD-10 Code Description Active Date Resolved Date L97.521 Non-pressure chronic ulcer of other part of left foot limited to breakdown of skin 12/16/2022 12/16/2022 Electronic Signature(s) [name], [name] ([id number]) [phone].pdf Page 5 of 7 Signed: 02/20/2023 4:28:38 PM By: Duanne Guess MD FACS Entered By: Duanne Guess on 02/20/2023 14:20:24 -------------------------------------------------------------------------------- Progress Note Details Patient Name: Date of Service: [name]. 02/20/2023 2:00 PM Medical Record Number: [id number] Patient Account Number: [id number] Date of Birth/Sex: Treating RN: [birth year][name] (77 y.o. M) Primary Care Provider: Oliver [name] Other Clinician: Referring Provider: Treating Provider/Extender: [name] in Treatment: 9 Subjective Chief Complaint Information obtained from Patient Patients presents for treatment of an open diabetic ulcer History of Present Illness (HPI) ADMISSION 12/16/2022 ***ABIs [name]: 0.95; R: 0.88*** This is a 77 year old type II diabetic (last hemoglobin A1c 7.2% on November 20, 2022) with a fairly extensive cardiac and peripheral vascular history. 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 Most recently, he  has been followed by a podiatrist in [place] for right and left diabetic foot ulcers on the first metatarsal head. He says they were at first applying some kind of cream, the name of which he does not remember and have now been applying what he describes as a "some white stuff." Due to failure of the wounds to heal, he was referred to the wound care center for further evaluation and management. 12/29/2022: The left foot wound is healed. The right first metatarsal head wound is surrounded with macerated callus and skin. There is some slough on the surface. 01/12/2023: The tissue maceration is much better this week. There is an odor coming from the wound, however. The wound is stable in size. 01/19/2023: The culture that I took last week grew out multiple species, including Enterococcus, strep, staph, and small amounts of Pseudomonas. I prescribed Augmentin and he is currently taking this. The wound itself is looking a little bit better. Less maceration and a healthier appearing surface. The odor has abated. 01/24/2023: The wound is about the same size today, but  the overall appearance is improved. There is no tissue maceration. The surface has a layer of slough overlying the granulation tissue. No malodor or purulent drainage. 12/5; the patient has a small wound on the right first metatarsal head. There is nothing open on the left side. This is a diabetic foot ulcer we have been using gentamicin mupirocin and silver alginate. 12/10; this is a small wound on the right first metatarsal head. Unfortunately not much different this week. 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 We have been using gent, mupirocin, silver alginate. 02/20/2023: No significant change in the wound dimensions. There is a fair amount of callus accumulation this week. His forefoot offloading shoe is  pretty battered. Objective Constitutional Hypotensive, but normal for this patient. no acute distress. Vitals Time Taken: 1:48 AM, Height: 70 in, Weight: 186 lbs, BMI: 26.7, Temperature: 97.4 F, Pulse: 84 bpm, Respiratory Rate: 18 breaths/min, Blood Pressure: 98/69 mmHg. Respiratory Normal work of breathing on room air.. General Notes: 02/20/2023: No significant change in the wound dimensions. There is a fair amount of callus accumulation this week. Integumentary (Hair, Skin) Wound #1 status is Open. Original cause of wound was Gradually Appeared. The date acquired was: 05/30/2022. The wound has been in treatment 9 weeks. The wound is located on the Right,Medial,Plantar Foot. The wound measures 0.6cm length x 0.6cm width x 0.1cm depth; 0.283cm^2 area and 0.028cm^3 volume. There is Fat Layer (Subcutaneous Tissue) exposed. There is no tunneling noted, however, there is undermining starting at 4:00 and ending at 6:00 with a maximum distance of 0.2cm. There is a medium amount of serosanguineous drainage noted. The wound margin is thickened. There is large (67-100%) red, pink [name], [name] ([id number]) [phone].pdf Page 6 of 7 granulation within the wound bed. There is a small (1-33%) amount of necrotic tissue within the wound bed including Adherent Slough. The periwound skin appearance had no abnormalities noted for color. The periwound skin appearance exhibited: Callus, Maceration. The periwound skin appearance did not exhibit: Dry/Scaly. Periwound temperature was noted as No Abnormality. Assessment Active Problems ICD-10 Non-pressure chronic ulcer of other part of right foot with fat layer exposed Type 2 diabetes mellitus with foot ulcer Peripheral vascular disease, unspecified Type 2 diabetes mellitus with other diabetic neurological complication Procedures Wound #1 Pre-procedure diagnosis of Wound #1 is a Diabetic Wound/Ulcer of the Lower Extremity located on the  Right,Medial,Plantar Foot .Severity of Tissue Pre Debridement is: Fat layer exposed. There was a Excisional Skin/Subcutaneous Tissue Debridement with a total area of 0.57 sq cm performed by Duanne Guess, MD. With the following instrument(s): Curette to remove Viable and Non-Viable tissue/material. Material removed includes Callus, Subcutaneous Tissue, Slough, and Skin: Epidermis after achieving pain control using Lidocaine 5% topical ointment. No specimens were taken. A time out was conducted at 14:05, prior to the start of the procedure. A Minimum amount of bleeding was controlled with Pressure. The procedure was tolerated well with a pain level of 0 throughout and a pain level of 0 following the procedure. Post Debridement Measurements: 0.6cm length x 0.6cm width x 0.1cm depth; 0.028cm^3 volume. Character of Wound/Ulcer Post Debridement is stable. Severity of Tissue Post Debridement is: Fat layer exposed. Post procedure Diagnosis Wound #1: Same as Pre-Procedure Plan Follow-up Appointments: Return Appointment in 1 week. - 1-2 weeks with Dr. Lady Gary Anesthetic: (In clinic) Topical Lidocaine 5% applied to wound bed Bathing/ Shower/ Hygiene: May shower and wash wound  with soap and water. - with dressing changes Off-Loading: Wound #1 Right,Medial,Plantar Foot: Wedge shoe to: - right foot - wear when walking, try to keep pressure off wound area at all times WOUND #1: - Foot Wound Laterality: Plantar, Right, Medial Cleanser: Soap and Water 1 x Per Day/30 Days Discharge Instructions: May shower and wash wound with dial antibacterial soap and water prior to dressing change. Cleanser: Vashe 5.8 (oz) 1 x Per Day/30 Days Discharge Instructions: Or Cleanse the wound with Vashe prior to applying a clean dressing using gauze sponges, not tissue or cotton balls. Peri-Wound Care: Zinc Oxide Ointment 30g tube 1 x Per Day/30 Days Discharge Instructions: Apply Zinc Oxide to periwound with each dressing  change Topical: Gentamicin 1 x Per Day/30 Days Discharge Instructions: As directed by physician Topical: Mupirocin Ointment 1 x Per Day/30 Days Discharge Instructions: Apply Mupirocin (Bactroban) as instructed Prim Dressing: Maxorb Extra Ag+ Alginate Dressing, 2x2 (in/in) (Generic) 1 x Per Day/30 Days ary Discharge Instructions: Apply to wound bed as instructed Secondary Dressing: Optifoam Non-Adhesive Dressing, 4x4 in 1 x Per Day/30 Days Discharge Instructions: Apply over primary dressing cut to make foam donut Secondary Dressing: Zetuvit Plus Silicone Border Dressing 4x4 (in/in) (DME) (Dispense As Written) 1 x Per Day/30 Days Discharge Instructions: Apply silicone border over primary dressing as directed. 02/20/2023: No significant change in the wound dimensions. There is a fair amount of callus accumulation this week. His forefoot offloading shoe is pretty battered. I used a curette to debride callus, slough, and subcutaneous tissue from his wound. We will continue the mixture of topical gentamicin and mupirocin for prophylaxis against infection in this diabetic foot ulcer. Continue silver alginate. We have replaced his forefoot offloading shoe and built-up the felt padding and cut out to further offload the wound site. He will follow-up in about 1 week. Electronic Signature(s) Signed: 02/20/2023 4:28:38 PM By: Duanne Guess MD FACS Entered By: Duanne Guess on 02/20/2023 14:28:24 [name] ([id number]) [id number] Page 7 of 7 -------------------------------------------------------------------------------- SuperBill Details Patient Name: Date of Service: JONAS, WORRELL MES [name]. 02/20/2023 Medical Record Number: [id number] Patient Account Number: [id number] Date of Birth/Sex: Treating RN: [birth year] [name] (77 y.o. M) Primary Care Provider: Oliver [name] Other Clinician: Referring Provider: Treating Provider/Extender: [name] in  Treatment: 9 Diagnosis Coding ICD-10 Codes Code Description [phone] Non-pressure chronic ulcer of other part of right foot with fat layer exposed E11.621 Type 2 diabetes mellitus with foot ulcer I73.9 Peripheral vascular disease, unspecified E11.49 Type 2 diabetes mellitus with other diabetic neurological complication Facility Procedures : CPT4 Code: [id number] Description: 11042 - DEB SUBQ TISSUE 20 SQ CM/< ICD-10 Diagnosis Description L97.512 Non-pressure chronic ulcer of other part of right foot with fat layer exposed E11.621 Type 2 diabetes mellitus with foot ulcer I73.9 Peripheral vascular disease,  unspecified E11.49 Type 2 diabetes mellitus with other diabetic neurological complication Modifier: Quantity: 1 Physician Procedures : CPT4 Code Description Modifier [id number] 11042 - WC PHYS SUBQ TISS 20 SQ CM ICD-10 Diagnosis Description L97.512 Non-pressure chronic ulcer of other part of right foot with fat layer exposed E11.621 Type 2 diabetes mellitus with foot ulcer I73.9  Peripheral vascular disease, unspecified E11.49 Type 2 diabetes mellitus with other diabetic neurological complication Quantity: 1 Electronic Signature(s) Signed: 02/20/2023 4:28:38 PM By: Duanne Guess MD FACS Entered By: Duanne Guess on 02/20/2023 14:31:30

## 2023-03-02 DIAGNOSIS — S91301A Unspecified open wound, right foot, initial encounter: Secondary | ICD-10-CM | POA: Diagnosis not present

## 2023-03-06 ENCOUNTER — Encounter (HOSPITAL_BASED_OUTPATIENT_CLINIC_OR_DEPARTMENT_OTHER): Payer: Medicare Other | Attending: General Surgery | Admitting: General Surgery

## 2023-03-06 ENCOUNTER — Other Ambulatory Visit: Payer: Self-pay | Admitting: Internal Medicine

## 2023-03-06 DIAGNOSIS — I509 Heart failure, unspecified: Secondary | ICD-10-CM | POA: Insufficient documentation

## 2023-03-06 DIAGNOSIS — I110 Hypertensive heart disease with heart failure: Secondary | ICD-10-CM | POA: Diagnosis not present

## 2023-03-06 DIAGNOSIS — E11621 Type 2 diabetes mellitus with foot ulcer: Secondary | ICD-10-CM | POA: Diagnosis not present

## 2023-03-06 DIAGNOSIS — E1151 Type 2 diabetes mellitus with diabetic peripheral angiopathy without gangrene: Secondary | ICD-10-CM | POA: Insufficient documentation

## 2023-03-06 DIAGNOSIS — L97512 Non-pressure chronic ulcer of other part of right foot with fat layer exposed: Secondary | ICD-10-CM | POA: Insufficient documentation

## 2023-03-06 DIAGNOSIS — E1149 Type 2 diabetes mellitus with other diabetic neurological complication: Secondary | ICD-10-CM | POA: Diagnosis not present

## 2023-03-06 DIAGNOSIS — E0800 Diabetes mellitus due to underlying condition with hyperosmolarity without nonketotic hyperglycemic-hyperosmolar coma (NKHHC): Secondary | ICD-10-CM

## 2023-03-06 NOTE — Progress Notes (Signed)
 CHANCY, SMIGIEL ([id number]) [id number] Page 1 of 8 Visit Report for 03/06/2023 Arrival Information Details Patient Name: Date of Service: [name], [name] MES L. 03/06/2023 1:00 PM Medical Record Number: [id number] Patient Account Number: [id number] Date of Birth/Sex: Treating RN: 1945/09/12 (78 y.o. M) Primary Care Zacharius Funari: Norleen Lynwood Other Clinician: Referring Lakeita Panther: Treating Awanda Wilcock/Extender: Marolyn Delon Norleen Lynwood Devra in Treatment: 11 Visit Information History Since Last Visit All ordered tests and consults were completed: No Patient Arrived: Ambulatory Added or deleted any medications: No Arrival Time: 12:52 Any new allergies or adverse reactions: No Accompanied By: self Had a fall or experienced change in No Transfer Assistance: None activities of daily living that may affect Patient Identification Verified: Yes risk of falls: Secondary Verification Process Completed: Yes Signs or symptoms of abuse/neglect since last visito No Patient Requires Transmission-Based Precautions: No Hospitalized since last visit: No Patient Has Alerts: Yes Implantable device outside of the clinic excluding No Patient Alerts: Patient on Blood Thinner cellular tissue based products placed in the center Xarelto  since last visit: Has Dressing in Place as Prescribed: Yes Has Footwear/Offloading in Place as Prescribed: Yes Right: Wedge Shoe Pain Present Now: No Electronic Signature(s) Signed: 03/06/2023 3:55:02 PM By: Merleen Handing RN, BSN Entered By: Merleen Handing on 03/06/2023 10:11:31 -------------------------------------------------------------------------------- Encounter Discharge Information Details Patient Name: Date of Service: [name] SHILLING MES L. 03/06/2023 1:00 PM Medical Record Number: [id number] Patient Account Number: [id number] Date of Birth/Sex: Treating RN: Jan 09, 1946 (78 y.o. NETTY Merleen Handing Primary Care Minela Bridgewater: Norleen Lynwood Other  Clinician: Referring Yvone Slape: Treating Levelle Edelen/Extender: Marolyn Delon Norleen Lynwood Devra in Treatment: 11 Encounter Discharge Information Items Post Procedure Vitals Discharge Condition: Stable Temperature (F): 97.6 Ambulatory Status: Ambulatory Pulse (bpm): 79 Discharge Destination: Home Respiratory Rate (breaths/min): 18 Transportation: Private Auto Blood Pressure (mmHg): 99/66 Accompanied By: self Schedule Follow-up Appointment: Yes Clinical Summary of Care: Patient Declined Electronic Signature(s) Signed: 03/06/2023 3:55:02 PM By: Merleen Handing RN, BSN Entered By: Merleen Handing on 03/06/2023 10:45:04 [name] LYNWOOD CROME ([id number]) [id number] Page 2 of 8 -------------------------------------------------------------------------------- Lower Extremity Assessment Details Patient Name: Date of Service: [name], [name] MES L. 03/06/2023 1:00 PM Medical Record Number: [id number] Patient Account Number: [id number] Date of Birth/Sex: Treating RN: 12-09-1945 (78 y.o. NETTY Merleen Handing Primary Care Stewart Pimenta: Norleen Lynwood Other Clinician: Referring Ott Zimmerle: Treating Dyanne Yorks/Extender: Marolyn Delon Norleen Lynwood Devra in Treatment: 11 Edema Assessment Assessed: [name]: No] [name]: No] Edema: [Left: N] [Right: o] Calf Left: Right: Point of Measurement: 29 cm From Medial Instep 35.5 cm Ankle Left: Right: Point of Measurement: 10 cm From Medial Instep 21 cm Vascular Assessment Pulses: Dorsalis Pedis Palpable: [Right:Yes] Extremity colors, hair growth, and conditions: Extremity Color: [Right:Normal] Hair Growth on Extremity: [Right:Yes] Temperature of Extremity: [Right:Warm] Capillary Refill: [Right:< 3 seconds] Dependent Rubor: [Right:No No] Electronic Signature(s) Signed: 03/06/2023 3:55:02 PM By: Merleen Handing RN, BSN Entered By: Merleen Handing on 03/06/2023  10:13:41 -------------------------------------------------------------------------------- Multi Wound Chart Details Patient Name: Date of Service: [name] SHILLING MES L. 03/06/2023 1:00 PM Medical Record Number: [id number] Patient Account Number: [id number] Date of Birth/Sex: Treating RN: 06/08/1945 (78 y.o. M) Primary Care Farid Grigorian: Norleen Lynwood Other Clinician: Referring Kaylie Ritter: Treating Roshad Hack/Extender: Marolyn Delon Norleen Lynwood Devra in Treatment: 11 Vital Signs Height(in): 70 Capillary Blood Glucose(mg/dl): 759 Weight(lbs): 813 Pulse(bpm): 79 Body Mass Index(BMI): 26.7 Blood Pressure(mmHg): 99/66 Temperature(F): 97.6 Respiratory Rate(breaths/min): 18 [1:Photos:] [N/A:N/A] Right, Medial, Plantar Foot N/A N/A Wound Location: Gradually Appeared N/A N/A Wounding Event: Diabetic Wound/Ulcer of the Lower N/A N/A Primary Etiology: Extremity Cataracts, Arrhythmia, Congestive N/A  N/A Comorbid History: Heart Failure, Coronary Artery Disease, Hypertension, Type II Diabetes, Neuropathy 05/30/2022 N/A N/A Date Acquired: 11 N/A N/A Weeks of Treatment: Open N/A N/A Wound Status: No N/A N/A Wound Recurrence: 0.8x0.7x0.1 N/A N/A Measurements L x W x D (cm) 0.44 N/A N/A A (cm) : rea 0.044 N/A N/A Volume (cm) : 56.90% N/A N/A % Reduction in A rea: 85.60% N/A N/A % Reduction in Volume: Grade 2 N/A N/A Classification: Medium N/A N/A Exudate A mount: Serosanguineous N/A N/A Exudate Type: red, brown N/A N/A Exudate Color: Thickened N/A N/A Wound Margin: Large (67-100%) N/A N/A Granulation A mount: Red N/A N/A Granulation Quality: Small (1-33%) N/A N/A Necrotic A mount: Fat Layer (Subcutaneous Tissue): Yes N/A N/A Exposed Structures: Fascia: No Tendon: No Muscle: No Joint: No Bone: No Small (1-33%) N/A N/A Epithelialization: Debridement - Excisional N/A N/A Debridement: Pre-procedure Verification/Time Out 13:20 N/A N/A Taken: Lidocaine  4% Topical Solution N/A  N/A Pain Control: Callus, Subcutaneous, Slough N/A N/A Tissue Debrided: Skin/Subcutaneous Tissue N/A N/A Level: 0.55 N/A N/A Debridement A (sq cm): rea Curette N/A N/A Instrument: Minimum N/A N/A Bleeding: Pressure N/A N/A Hemostasis A chieved: 0 N/A N/A Procedural Pain: 0 N/A N/A Post Procedural Pain: Procedure was tolerated well N/A N/A Debridement Treatment Response: 0.8x0.7x0.1 N/A N/A Post Debridement Measurements L x W x D (cm) 0.044 N/A N/A Post Debridement Volume: (cm) Callus: Yes N/A N/A Periwound Skin Texture: Maceration: Yes N/A N/A Periwound Skin Moisture: Dry/Scaly: No No Abnormalities Noted N/A N/A Periwound Skin Color: No Abnormality N/A N/A Temperature: Debridement N/A N/A Procedures Performed: Treatment Notes Wound #1 (Foot) Wound Laterality: Plantar, Right, Medial Cleanser Soap and Water Discharge Instruction: May shower and wash wound with dial  antibacterial soap and water prior to dressing change. Vashe 5.8 (oz) Discharge Instruction: Or Cleanse the wound with Vashe prior to applying a clean dressing using gauze sponges, not tissue or cotton balls. Peri-Wound Care Zinc Oxide Ointment 30g tube Discharge Instruction: Apply Zinc Oxide to periwound with each dressing change Topical Gentamicin  Discharge Instruction: As directed by physician Mupirocin  Ointment Discharge Instruction: Apply Mupirocin  (Bactroban ) as instructed ALIREZA, POLLACK ([id number]) [phone].pdf Page 4 of 8 Primary Dressing Promogran Prisma Matrix, 4.34 (sq in) (silver collagen) Discharge Instruction: Moisten collagen with saline or hydrogel Secondary Dressing Optifoam Non-Adhesive Dressing, 4x4 in Discharge Instruction: Apply over primary dressing cut to make foam donut Woven Gauze Sponges 2x2 in Discharge Instruction: Apply over primary dressing as directed. Zetuvit Plus Silicone Border Dressing 4x4 (in/in) Discharge Instruction: Apply silicone  border over primary dressing as directed. Secured With Compression Wrap Compression Stockings Facilities Manager) Signed: 03/06/2023 2:11:03 PM By: Marolyn Nest MD FACS Entered By: Marolyn Nest on 03/06/2023 11:11:03 -------------------------------------------------------------------------------- Multi-Disciplinary Care Plan Details Patient Name: Date of Service: [name] SHILLING MES L. 03/06/2023 1:00 PM Medical Record Number: [id number] Patient Account Number: [id number] Date of Birth/Sex: Treating RN: 21-Aug-1945 (78 y.o. NETTY Merleen Handing Primary Care Anthany Thornhill: Norleen Lynwood Other Clinician: Referring Mansa Willers: Treating Teiana Hajduk/Extender: Marolyn Nest Norleen Lynwood Devra in Treatment: 11 Multidisciplinary Care Plan reviewed with physician Active Inactive Nutrition Nursing Diagnoses: Impaired glucose control: actual or potential Potential for alteration in Nutrition/Potential for imbalanced nutrition Goals: Patient/caregiver will maintain therapeutic glucose control Date Initiated: 02/20/2023 Target Resolution Date: 03/21/2023 Goal Status: Active Interventions: Assess patient nutrition upon admission and as needed per policy Provide education on elevated blood sugars and impact on wound healing Treatment Activities: Patient referred to Primary Care Physician for further nutritional evaluation : 02/20/2023 Notes: Wound/Skin Impairment Nursing  Diagnoses: Impaired tissue integrity Goals: Patient/caregiver will verbalize understanding of skin care regimen ARAVIND, CHRISMER L ([id number]) [phone] Page 5 of 8 Date Initiated: 12/16/2022 Target Resolution Date: 04/03/2023 Goal Status: Active Interventions: Assess ulceration(s) every visit Treatment Activities: Skin care regimen initiated : 12/16/2022 Notes: Electronic Signature(s) Signed: 03/06/2023 3:55:02 PM By: Merleen Handing RN, BSN Entered By: Boehlein, Linda on 03/06/2023  10:18:23 -------------------------------------------------------------------------------- Pain Assessment Details Patient Name: Date of Service: [name] SHILLING MES L. 03/06/2023 1:00 PM Medical Record Number: [id number] Patient Account Number: [id number] Date of Birth/Sex: Treating RN: [birth year] (78 y.o. M) Primary Care Hilbert Briggs: Norleen Agent Other Clinician: Referring Skylan Lara: Treating Finas Delone/Extender: Marolyn Delon Norleen Agent Devra in Treatment: 11 Active Problems Location of Pain Severity and Description of Pain Patient Has Paino No Site Locations Rate the pain. Current Pain Level: 0 Pain Management and Medication Current Pain Management: Electronic Signature(s) Signed: 03/06/2023 3:55:02 PM By: Merleen Handing RN, BSN Entered By: Boehlein, Linda on 03/06/2023 10:12:31 -------------------------------------------------------------------------------- Patient/Caregiver Education Details Patient Name: Date of Service: [name] SHILLING MES L. 1/6/2025andnbsp1:00 PM Medical Record Number: [id number] Patient Account Number: [id number] COURTNEY, FENLON ([id number]) [id number] Page 6 of 8 Date of Birth/Gender: Treating RN: 03/26/1945 (78 y.o. NETTY Merleen Handing Primary Care Physician: Norleen Agent Other Clinician: Referring Physician: Treating Physician/Extender: Marolyn Delon Norleen Agent Devra in Treatment: 11 Education Assessment Education Provided To: Patient Education Topics Provided Elevated Blood Sugar/ Impact on Healing: Methods: Explain/Verbal Responses: Reinforcements needed, State content correctly Offloading: Methods: Explain/Verbal Responses: Reinforcements needed, State content correctly Wound/Skin Impairment: Methods: Explain/Verbal Responses: Reinforcements needed, State content correctly Electronic Signature(s) Signed: 03/06/2023 3:55:02 PM By: Merleen Handing RN, BSN Entered By: Merleen Handing on 03/06/2023  10:18:50 -------------------------------------------------------------------------------- Wound Assessment Details Patient Name: Date of Service: [name] SHILLING MES L. 03/06/2023 1:00 PM Medical Record Number: [id number] Patient Account Number: [id number] Date of Birth/Sex: Treating RN: 1945-09-05 (78 y.o. NETTY Merleen Handing Primary Care Bradshaw Minihan: Norleen Agent Other Clinician: Referring Arnika Larzelere: Treating Alexandre Lightsey/Extender: Marolyn Delon Norleen Agent Devra in Treatment: 11 Wound Status Wound Number: 1 Primary Diabetic Wound/Ulcer of the Lower Extremity Etiology: Wound Location: Right, Medial, Plantar Foot Wound Open Wounding Event: Gradually Appeared Status: Date Acquired: 05/30/2022 Comorbid Cataracts, Arrhythmia, Congestive Heart Failure, Coronary Artery Weeks Of Treatment: 11 History: Disease, Hypertension, Type II Diabetes, Neuropathy Clustered Wound: No Photos Wound Measurements Length: (cm) 0.8 Width: (cm) 0.7 Depth: (cm) 0.1 Area: (cm) 0.44 Civello, Suresh L ([id number]) Volume: (cm) 0.044 % Reduction in Area: 56.9% % Reduction in Volume: 85.6% Epithelialization: Small (1-33%) Tunneling: No [id number] Page 7 of 8 Undermining: No Wound Description Classification: Grade 2 Wound Margin: Thickened Exudate Amount: Medium Exudate Type: Serosanguineous Exudate Color: red, brown Foul Odor After Cleansing: No Slough/Fibrino Yes Wound Bed Granulation Amount: Large (67-100%) Exposed Structure Granulation Quality: Red Fascia Exposed: No Necrotic Amount: Small (1-33%) Fat Layer (Subcutaneous Tissue) Exposed: Yes Necrotic Quality: Adherent Slough Tendon Exposed: No Muscle Exposed: No Joint Exposed: No Bone Exposed: No Periwound Skin Texture Texture Color No Abnormalities Noted: No No Abnormalities Noted: Yes Callus: Yes Temperature / Pain Temperature: No Abnormality Moisture No Abnormalities Noted: No Dry / Scaly: No Maceration: Yes Treatment  Notes Wound #1 (Foot) Wound Laterality: Plantar, Right, Medial Cleanser Soap and Water Discharge Instruction: May shower and wash wound with dial  antibacterial soap and water prior to dressing change. Vashe 5.8 (oz) Discharge Instruction: Or Cleanse the wound with Vashe prior to applying a clean dressing using gauze sponges, not tissue or cotton balls. Peri-Wound Care Zinc Oxide Ointment 30g tube Discharge Instruction:  Apply Zinc Oxide to periwound with each dressing change Topical Gentamicin  Discharge Instruction: As directed by physician Mupirocin  Ointment Discharge Instruction: Apply Mupirocin  (Bactroban ) as instructed Primary Dressing Promogran Prisma Matrix, 4.34 (sq in) (silver collagen) Discharge Instruction: Moisten collagen with saline or hydrogel Secondary Dressing Optifoam Non-Adhesive Dressing, 4x4 in Discharge Instruction: Apply over primary dressing cut to make foam donut Woven Gauze Sponges 2x2 in Discharge Instruction: Apply over primary dressing as directed. Zetuvit Plus Silicone Border Dressing 4x4 (in/in) Discharge Instruction: Apply silicone border over primary dressing as directed. Secured With Compression Wrap Compression Stockings Facilities Manager) Signed: 03/06/2023 3:55:02 PM By: Boehlein, Linda RN, BSN Entered By: Boehlein, Linda on 03/06/2023 10:15:05 [name] LYNWOOD CROME ([id number]) [id number] Page 8 of 8 -------------------------------------------------------------------------------- Vitals Details Patient Name: Date of Service: WILFREDO, [place] MES L. 03/06/2023 1:00 PM Medical Record Number: [id number] Patient Account Number: [id number] Date of Birth/Sex: Treating RN: [birth year] (78 y.o. M) Primary Care Annaleese Guier: Norleen Lynwood Other Clinician: Referring Jeryl Umholtz: Treating Braidon Chermak/Extender: Marolyn Delon Norleen Lynwood Devra in Treatment: 11 Vital Signs Time Taken: 12:52 Temperature (F): 97.6 Height (in): 70 Pulse  (bpm): 79 Weight (lbs): 186 Respiratory Rate (breaths/min): 18 Body Mass Index (BMI): 26.7 Blood Pressure (mmHg): 99/66 Capillary Blood Glucose (mg/dl): 759 Reference Range: 80 - 120 mg / dl Notes glucose per pts meter at present Electronic Signature(s) Signed: 03/06/2023 3:55:02 PM By: Merleen Handing RN, BSN Entered By: Boehlein, Linda on 03/06/2023 10:12:24

## 2023-03-06 NOTE — Progress Notes (Addendum)
 [name] ([id number]) [id number] Page 1 of 7 Visit Report for 03/06/2023 Chief Complaint Document Details Patient Name: Date of Service: [name], [name]. 03/06/2023 1:00 PM Medical Record Number: [id number] Patient Account Number: [id number] Date of Birth/Sex: Treating RN: [birth year] (78 y.o. M) Primary Care Provider: Norleen [name] Other Clinician: Referring Provider: Treating Provider/Extender: [name] in Treatment: 11 Information Obtained from: Patient Chief Complaint Patients presents for treatment of an open diabetic ulcer Electronic Signature(s) Signed: 03/06/2023 2:11:11 PM By: [name] Delon MD FACS Entered By: [name] on 03/06/2023 11:11:11 -------------------------------------------------------------------------------- Debridement Details Patient Name: Date of Service: [name] SHILLING [name]. 03/06/2023 1:00 PM Medical Record Number: [id number] Patient Account Number: [id number] Date of Birth/Sex: Treating RN: 02/01/1946 (78 y.o. [name] Primary Care Provider: Norleen [name] Other Clinician: Referring Provider: Treating Provider/Extender: [name] in Treatment: 11 Debridement Performed for Assessment: Wound #1 Right,Medial,Plantar Foot Performed By: Physician [name] Delon, MD The following information was scribed by: [name] The information was scribed for: [name] Debridement Type: Debridement Severity of Tissue Pre Debridement: Fat layer exposed Level of Consciousness (Pre-procedure): Awake and Alert Pre-procedure Verification/Time Out Yes - 13:20 Taken: Start Time: 13:21 Pain Control: Lidocaine  4% T opical Solution Percent of Wound Bed Debrided: 125% T Area Debrided (cm): otal 0.55 Tissue and other material debrided: Viable, Non-Viable, Callus, Slough, Subcutaneous, Skin: Epidermis, Slough Level: Skin/Subcutaneous Tissue Debridement Description:  Excisional Instrument: Curette Bleeding: Minimum Hemostasis Achieved: Pressure Procedural Pain: 0 Post Procedural Pain: 0 Response to Treatment: Procedure was tolerated well Level of Consciousness (Post- Awake and Alert procedure): Post Debridement Measurements of Total Wound Length: (cm) 0.8 Width: (cm) 0.7 Depth: (cm) 0.1 Volume: (cm) 0.044 [name] ([id number]) [phone].pdf Page 2 of 7 Character of Wound/Ulcer Post Debridement: Improved Severity of Tissue Post Debridement: Fat layer exposed Post Procedure Diagnosis Same as Pre-procedure Electronic Signature(s) Signed: 03/06/2023 3:07:01 PM By: [name] Delon MD FACS Signed: 03/06/2023 3:55:02 PM By: [name] Handing RN, BSN Entered By: [name] on 03/06/2023 10:26:16 -------------------------------------------------------------------------------- HPI Details Patient Name: Date of Service: [name] SHILLING [name]. 03/06/2023 1:00 PM Medical Record Number: [id number] Patient Account Number: [id number] Date of Birth/Sex: Treating RN: 17-May-1945 (78 y.o. M) Primary Care Provider: Norleen [name] Other Clinician: Referring Provider: Treating Provider/Extender: [name] in Treatment: 11 History of Present Illness HPI Description: ADMISSION 12/16/2022 ***ABIs [name]: 0.95; R: 0.88*** This is a 78 year old type II diabetic (last hemoglobin A1c 7.2% on November 20, 2022) with a fairly extensive cardiac and peripheral vascular history. 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 Most recently, he has been followed by a podiatrist in [place] for right and left diabetic foot ulcers on the first metatarsal head. He says they were at first applying some kind of cream, the name of which he does not remember and have now been applying what he describes as a some white stuff. Due to failure of the wounds to heal, he was referred to the wound care center for  further evaluation and management. 12/29/2022: The left foot wound is healed. The right first metatarsal head wound is surrounded with macerated callus and skin. There is some slough on the surface. 01/12/2023: The tissue maceration is much better this week. There is an odor coming from the wound, however. The wound is stable in size. 01/19/2023: The culture that I took last week grew out multiple species, including Enterococcus, strep, staph, and small amounts of Pseudomonas. I  prescribed Augmentin  and he is currently taking this. The wound itself is looking a little bit better. Less maceration and a healthier appearing surface. The odor has abated. 01/24/2023: The wound is about the same size today, but the overall appearance is improved. There is no tissue maceration. The surface has a layer of slough overlying the granulation tissue. No malodor or purulent drainage. 12/5; the patient has a small wound on the right first metatarsal head. There is nothing open on the left side. This is a diabetic foot ulcer we have been using gentamicin  mupirocin  and silver alginate. 12/10; this is a small wound on the right first metatarsal head. Unfortunately not much different this week. 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 We have been using gent, mupirocin , silver alginate. 02/20/2023: No significant change in the wound dimensions. There is a fair amount of callus accumulation this week. His forefoot offloading shoe is pretty battered. 03/06/2023: There is a continued lack of progress in this wound. He continues to accumulate callus around the site despite wearing a forefoot offloading sandal. The wound dimensions are basically unchanged although the tissue appears quite healthy. Electronic Signature(s) Signed: 03/06/2023 2:14:11 PM By: [name] Nest MD FACS Entered By: [name] Nest on 03/06/2023  11:14:11 -------------------------------------------------------------------------------- Physical Exam Details Patient Name: Date of Service: [name] SHILLING [name]. 03/06/2023 1:00 PM Medical Record Number: [id number] Patient Account Number: [id number] [name], [name] ([id number]) [phone].pdf Page 3 of 7 Date of Birth/Sex: Treating RN: [birth year] (78 y.o. M) Primary Care Provider: Other Clinician: Norleen [name] Referring Provider: Treating Provider/Extender: [name] Nest [name] in Treatment: 11 Constitutional Hypotensive, but normal for this patient. . . . no acute distress. Respiratory Normal work of breathing on room air.. Notes 03/06/2023: There is a continued lack of progress in this wound. He continues to accumulate callus around the site despite wearing a forefoot offloading sandal. The wound dimensions are basically unchanged although the tissue appears quite healthy. Electronic Signature(s) Signed: 03/06/2023 2:15:25 PM By: [name] Nest MD FACS Entered By: [name] Nest on 03/06/2023 11:15:25 -------------------------------------------------------------------------------- Physician Orders Details Patient Name: Date of Service: [name] SHILLING [name]. 03/06/2023 1:00 PM Medical Record Number: [id number] Patient Account Number: [id number] Date of Birth/Sex: Treating RN: [birth year] (78 y.o. [name] Primary Care Provider: Norleen [name] Other Clinician: Referring Provider: Treating Provider/Extender: [name] Nest [name] in Treatment: 11 The following information was scribed by: [name] The information was scribed for: [name] Nest Verbal / Phone Orders: No Diagnosis Coding ICD-10 Coding Code Description [phone] Non-pressure chronic ulcer of other part of right foot with fat layer exposed E11.621 Type 2 diabetes mellitus with foot ulcer I73.9 Peripheral vascular disease, unspecified E11.49 Type 2 diabetes  mellitus with other diabetic neurological complication Follow-up Appointments ppointment in 1 week. - Dr. Marolyn Return A 1/13 @ 1:00 pm Anesthetic (In clinic) Topical Lidocaine  5% applied to wound bed Bathing/ Shower/ Hygiene May shower and wash wound with soap and water. - remove dressing prior to shower Off-Loading Wound #1 Right,Medial,Plantar Foot Wedge shoe to: - right foot - wear when walking, try to keep pressure off wound area at all times Wound Treatment Wound #1 - Foot Wound Laterality: Plantar, Right, Medial Cleanser: Soap and Water 1 x Per Day/30 Days Discharge Instructions: May shower and wash wound with dial  antibacterial soap and water prior to dressing change. Cleanser: Vashe 5.8 (oz) 1 x Per Day/30 Days Discharge  Instructions: Or Cleanse the wound with Vashe prior to applying a clean dressing using gauze sponges, not tissue or cotton balls. Peri-Wound Care: Zinc Oxide Ointment 30g tube 1 x Per Day/30 Days Discharge Instructions: Apply Zinc Oxide to periwound with each dressing change [name], [name] ([id number]) [phone].pdf Page 4 of 7 Topical: Gentamicin  1 x Per Day/30 Days Discharge Instructions: As directed by physician Topical: Mupirocin  Ointment 1 x Per Day/30 Days Discharge Instructions: Apply Mupirocin  (Bactroban ) as instructed Prim Dressing: Promogran Prisma Matrix, 4.34 (sq in) (silver collagen) 1 x Per Day/30 Days ary Discharge Instructions: Moisten collagen with saline or hydrogel Secondary Dressing: Optifoam Non-Adhesive Dressing, 4x4 in 1 x Per Day/30 Days Discharge Instructions: Apply over primary dressing cut to make foam donut Secondary Dressing: Woven Gauze Sponges 2x2 in 1 x Per Day/30 Days Discharge Instructions: Apply over primary dressing as directed. Secondary Dressing: Zetuvit Plus Silicone Border Dressing 4x4 (in/in) (Dispense As Written) 1 x Per Day/30 Days Discharge Instructions: Apply silicone border over primary  dressing as directed. Electronic Signature(s) Signed: 03/06/2023 3:07:01 PM By: [name] Nest MD FACS Entered By: [name] Nest on 03/06/2023 11:15:50 -------------------------------------------------------------------------------- Problem List Details Patient Name: Date of Service: [name] SHILLING [name]. 03/06/2023 1:00 PM Medical Record Number: [id number] Patient Account Number: [id number] Date of Birth/Sex: Treating RN: 10-12-1945 (78 y.o. [name] Primary Care Provider: Norleen [name] Other Clinician: Referring Provider: Treating Provider/Extender: [name] Nest [name] in Treatment: 11 Active Problems ICD-10 Encounter Code Description Active Date MDM Diagnosis L97.512 Non-pressure chronic ulcer of other part of right foot with fat layer exposed 12/16/2022 No Yes E11.621 Type 2 diabetes mellitus with foot ulcer 12/16/2022 No Yes I73.9 Peripheral vascular disease, unspecified 12/16/2022 No Yes E11.49 Type 2 diabetes mellitus with other diabetic neurological complication 12/16/2022 No Yes Inactive Problems ICD-10 Code Description Active Date Inactive Date L84 Corns and callosities 12/16/2022 12/16/2022 Resolved Problems ICD-10 Code Description Active Date Resolved Date AVIS, [facility]MA[facility]ILL ([id number]) [phone].pdf Page 5 of 7 L97.521 Non-pressure chronic ulcer of other part of left foot limited to breakdown of skin 12/16/2022 12/16/2022 Electronic Signature(s) Signed: 03/06/2023 2:10:55 PM By: [name] Nest MD FACS Previous Signature: 03/06/2023 1:22:20 PM Version By: [name] Nest MD FACS Entered By: [name] Nest on 03/06/2023 11:10:55 -------------------------------------------------------------------------------- Progress Note Details Patient Name: Date of Service: [name] SHILLING [name]. 03/06/2023 1:00 PM Medical Record Number: [id number] Patient Account Number: [id number] Date of Birth/Sex: Treating RN: [birth year] (78 y.o. M) Primary  Care Provider: Norleen [name] Other Clinician: Referring Provider: Treating Provider/Extender: [name] Nest [name] in Treatment: 11 Subjective Chief Complaint Information obtained from Patient Patients presents for treatment of an open diabetic ulcer History of Present Illness (HPI) ADMISSION 12/16/2022 ***ABIs [name]: 0.95; R: 0.88*** This is a 78 year old type II diabetic (last hemoglobin A1c 7.2% on November 20, 2022) with a fairly extensive cardiac and peripheral vascular history. 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 Most recently, he has been followed by a podiatrist in [place] for right and left diabetic foot ulcers on the first metatarsal head. He says they were at first applying some kind of cream, the name of which he does not remember and have now been applying what he describes as a some white stuff. Due to failure of the wounds to heal, he was referred to the wound care center for further evaluation and management. 12/29/2022: The left foot wound is healed. The right first metatarsal head wound is surrounded with macerated callus and skin. There  is some slough on the surface. 01/12/2023: The tissue maceration is much better this week. There is an odor coming from the wound, however. The wound is stable in size. 01/19/2023: The culture that I took last week grew out multiple species, including Enterococcus, strep, staph, and small amounts of Pseudomonas. I prescribed Augmentin  and he is currently taking this. The wound itself is looking a little bit better. Less maceration and a healthier appearing surface. The odor has abated. 01/24/2023: The wound is about the same size today, but the overall appearance is improved. There is no tissue maceration. The surface has a layer of slough overlying the granulation tissue. No malodor or purulent drainage. 12/5; the patient has a small wound on the right first metatarsal head. There  is nothing open on the left side. This is a diabetic foot ulcer we have been using gentamicin  mupirocin  and silver alginate. 12/10; this is a small wound on the right first metatarsal head. Unfortunately not much different this week. 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 We have been using gent, mupirocin , silver alginate. 02/20/2023: No significant change in the wound dimensions. There is a fair amount of callus accumulation this week. His forefoot offloading shoe is pretty battered. 03/06/2023: There is a continued lack of progress in this wound. He continues to accumulate callus around the site despite wearing a forefoot offloading sandal. The wound dimensions are basically unchanged although the tissue appears quite healthy. Objective Constitutional Hypotensive, but normal for this patient. no acute distress. Vitals Time Taken: 12:52 PM, Height: 70 in, Weight: 186 lbs, BMI: 26.7, Temperature: 97.6 F, Pulse: 79 bpm, Respiratory Rate: 18 breaths/min, Blood Pressure: 99/66 mmHg, Capillary Blood Glucose: 240 mg/dl. General Notes: glucose per pts meter at present [name], [name] ([id number]) [phone].pdf Page 6 of 7 Respiratory Normal work of breathing on room air.. General Notes: 03/06/2023: There is a continued lack of progress in this wound. He continues to accumulate callus around the site despite wearing a forefoot offloading sandal. The wound dimensions are basically unchanged although the tissue appears quite healthy. Integumentary (Hair, Skin) Wound #1 status is Open. Original cause of wound was Gradually Appeared. The date acquired was: 05/30/2022. The wound has been in treatment 11 weeks. The wound is located on the Right,Medial,Plantar Foot. The wound measures 0.8cm length x 0.7cm width x 0.1cm depth; 0.44cm^2 area and 0.044cm^3 volume. There is Fat Layer (Subcutaneous Tissue) exposed.  There is no tunneling or undermining noted. There is a medium amount of serosanguineous drainage noted. The wound margin is thickened. There is large (67-100%) red granulation within the wound bed. There is a small (1-33%) amount of necrotic tissue within the wound bed including Adherent Slough. The periwound skin appearance had no abnormalities noted for color. The periwound skin appearance exhibited: Callus, Maceration. The periwound skin appearance did not exhibit: Dry/Scaly. Periwound temperature was noted as No Abnormality. Assessment Active Problems ICD-10 Non-pressure chronic ulcer of other part of right foot with fat layer exposed Type 2 diabetes mellitus with foot ulcer Peripheral vascular disease, unspecified Type 2 diabetes mellitus with other diabetic neurological complication Procedures Wound #1 Pre-procedure diagnosis of Wound #1 is a Diabetic Wound/Ulcer of the Lower Extremity located on the Right,Medial,Plantar Foot .Severity of Tissue Pre Debridement is: Fat layer exposed. There was a Excisional Skin/Subcutaneous Tissue Debridement with a total area of 0.55 sq cm performed by [name] Nest, MD. With the following instrument(s):  Curette to remove Viable and Non-Viable tissue/material. Material removed includes Callus, Subcutaneous Tissue, Slough, and Skin: Epidermis after achieving pain control using Lidocaine  4% Topical Solution. No specimens were taken. A time out was conducted at 13:20, prior to the start of the procedure. A Minimum amount of bleeding was controlled with Pressure. The procedure was tolerated well with a pain level of 0 throughout and a pain level of 0 following the procedure. Post Debridement Measurements: 0.8cm length x 0.7cm width x 0.1cm depth; 0.044cm^3 volume. Character of Wound/Ulcer Post Debridement is improved. Severity of Tissue Post Debridement is: Fat layer exposed. Post procedure Diagnosis Wound #1: Same as Pre-Procedure Plan Follow-up  Appointments: Return Appointment in 1 week. - Dr. Marolyn 1/13 @ 1:00 pm Anesthetic: (In clinic) Topical Lidocaine  5% applied to wound bed Bathing/ Shower/ Hygiene: May shower and wash wound with soap and water. - remove dressing prior to shower Off-Loading: Wound #1 Right,Medial,Plantar Foot: Wedge shoe to: - right foot - wear when walking, try to keep pressure off wound area at all times WOUND #1: - Foot Wound Laterality: Plantar, Right, Medial Cleanser: Soap and Water 1 x Per Day/30 Days Discharge Instructions: May shower and wash wound with dial  antibacterial soap and water prior to dressing change. Cleanser: Vashe 5.8 (oz) 1 x Per Day/30 Days Discharge Instructions: Or Cleanse the wound with Vashe prior to applying a clean dressing using gauze sponges, not tissue or cotton balls. Peri-Wound Care: Zinc Oxide Ointment 30g tube 1 x Per Day/30 Days Discharge Instructions: Apply Zinc Oxide to periwound with each dressing change Topical: Gentamicin  1 x Per Day/30 Days Discharge Instructions: As directed by physician Topical: Mupirocin  Ointment 1 x Per Day/30 Days Discharge Instructions: Apply Mupirocin  (Bactroban ) as instructed Prim Dressing: Promogran Prisma Matrix, 4.34 (sq in) (silver collagen) 1 x Per Day/30 Days ary Discharge Instructions: Moisten collagen with saline or hydrogel Secondary Dressing: Optifoam Non-Adhesive Dressing, 4x4 in 1 x Per Day/30 Days Discharge Instructions: Apply over primary dressing cut to make foam donut Secondary Dressing: Woven Gauze Sponges 2x2 in 1 x Per Day/30 Days Discharge Instructions: Apply over primary dressing as directed. Secondary Dressing: Zetuvit Plus Silicone Border Dressing 4x4 (in/in) (Dispense As Written) 1 x Per Day/30 Days Discharge Instructions: Apply silicone border over primary dressing as directed. 03/06/2023: There is a continued lack of progress in this wound. He continues to accumulate callus around the site despite wearing a  forefoot offloading sandal. The wound dimensions are basically unchanged although the tissue appears quite healthy. [name], [name] ([id number]) [id number] Page 7 of 7 I used a curette to debride callus, skin, slough, and subcutaneous tissue from the wound. I think he would potentially benefit from going into a total contact cast, but he is fairly resistant to this as the wound is on his right foot and he would be unable to drive. I am going to change his contact layer to Prisma silver collagen to see if we can get any tissue ingrowth. Continue topical gentamicin  and mupirocin . Follow-up in 1 week. Electronic Signature(s) Signed: 03/06/2023 2:16:48 PM By: [name] Nest MD FACS Entered By: [name] Nest on 03/06/2023 11:16:48 -------------------------------------------------------------------------------- SuperBill Details Patient Name: Date of Service: [name] SHILLING [name]. 03/06/2023 Medical Record Number: [id number] Patient Account Number: [id number] Date of Birth/Sex: Treating RN: Mar 10, 1945 (78 y.o. M) Primary Care Provider: Norleen [name] Other Clinician: Referring Provider: Treating Provider/Extender: [name] Nest [name] in Treatment: 11 Diagnosis Coding ICD-10 Codes Code Description [id number] Non-pressure chronic ulcer of other part of right  foot with fat layer exposed E11.621 Type 2 diabetes mellitus with foot ulcer I73.9 Peripheral vascular disease, unspecified E11.49 Type 2 diabetes mellitus with other diabetic neurological complication Facility Procedures : CPT4 Code: [id number] Description: 11042 - DEB SUBQ TISSUE 20 SQ CM/< ICD-10 Diagnosis Description L97.512 Non-pressure chronic ulcer of other part of right foot with fat layer exposed E11.621 Type 2 diabetes mellitus with foot ulcer I73.9 Peripheral vascular disease,  unspecified E11.49 Type 2 diabetes mellitus with other diabetic neurological complication Modifier: Quantity: 1 Physician  Procedures : CPT4 Code Description Modifier [id number] - WC PHYS LEVEL 4 - EST PT ICD-10 Diagnosis Description L97.512 Non-pressure chronic ulcer of other part of right foot with fat layer exposed E11.621 Type 2 diabetes mellitus with foot ulcer I73.9 Peripheral  vascular disease, unspecified E11.49 Type 2 diabetes mellitus with other diabetic neurological complication Quantity: 1 : [id number] 11042 - WC PHYS SUBQ TISS 20 SQ CM ICD-10 Diagnosis Description L97.512 Non-pressure chronic ulcer of other part of right foot with fat layer exposed E11.621 Type 2 diabetes mellitus with foot ulcer I73.9 Peripheral vascular disease,  unspecified E11.49 Type 2 diabetes mellitus with other diabetic neurological complication Quantity: 1 Electronic Signature(s) Signed: 03/06/2023 2:17:09 PM By: [name] Nest MD FACS Entered By: [name] Nest on 03/06/2023 11:17:09

## 2023-03-09 DIAGNOSIS — Z95810 Presence of automatic (implantable) cardiac defibrillator: Secondary | ICD-10-CM | POA: Diagnosis not present

## 2023-03-09 DIAGNOSIS — Z4502 Encounter for adjustment and management of automatic implantable cardiac defibrillator: Secondary | ICD-10-CM | POA: Diagnosis not present

## 2023-03-13 ENCOUNTER — Encounter (HOSPITAL_BASED_OUTPATIENT_CLINIC_OR_DEPARTMENT_OTHER): Payer: Medicare Other | Admitting: General Surgery

## 2023-03-13 DIAGNOSIS — I509 Heart failure, unspecified: Secondary | ICD-10-CM | POA: Diagnosis not present

## 2023-03-13 DIAGNOSIS — E1151 Type 2 diabetes mellitus with diabetic peripheral angiopathy without gangrene: Secondary | ICD-10-CM | POA: Diagnosis not present

## 2023-03-13 DIAGNOSIS — E1149 Type 2 diabetes mellitus with other diabetic neurological complication: Secondary | ICD-10-CM | POA: Diagnosis not present

## 2023-03-13 DIAGNOSIS — L97512 Non-pressure chronic ulcer of other part of right foot with fat layer exposed: Secondary | ICD-10-CM | POA: Diagnosis not present

## 2023-03-13 DIAGNOSIS — E11621 Type 2 diabetes mellitus with foot ulcer: Secondary | ICD-10-CM | POA: Diagnosis not present

## 2023-03-13 DIAGNOSIS — I110 Hypertensive heart disease with heart failure: Secondary | ICD-10-CM | POA: Diagnosis not present

## 2023-03-15 NOTE — Progress Notes (Signed)
 ROBERT, SPERL ([id number]) [id number] Page 1 of 7 Visit Report for 03/13/2023 Arrival Information Details Patient Name: Date of Service: [name], [name] MES L. 03/13/2023 1:00 PM Medical Record Number: [id number] Patient Account Number: [id number] Date of Birth/Sex: Treating RN: Jul 18, 1945 (78 y.o. M) Primary Care Nylia Gavina: Norleen Lynwood Other Clinician: Referring Aveline Daus: Treating Thaine Garriga/Extender: Marolyn Delon Norleen Lynwood Devra in Treatment: 12 Visit Information History Since Last Visit Added or deleted any medications: No Patient Arrived: Ambulatory Any new allergies or adverse reactions: No Arrival Time: 13:43 Had a fall or experienced change in No Accompanied By: self activities of daily living that may affect Transfer Assistance: None risk of falls: Patient Identification Verified: Yes Signs or symptoms of abuse/neglect since last visito No Secondary Verification Process Completed: Yes Hospitalized since last visit: No Patient Requires Transmission-Based Precautions: No Implantable device outside of the clinic excluding No Patient Has Alerts: Yes cellular tissue based products placed in the center Patient Alerts: Patient on Blood Thinner since last visit: Xarelto  Pain Present Now: No Electronic Signature(s) Signed: 03/13/2023 3:55:20 PM By: Okey Bonier Entered By: Okey Bonier on 03/13/2023 13:43:39 -------------------------------------------------------------------------------- Encounter Discharge Information Details Patient Name: Date of Service: [name] MES L. 03/13/2023 1:00 PM Medical Record Number: [id number] Patient Account Number: [id number] Date of Birth/Sex: Treating RN: April 20, 1945 (78 y.o. [name] Primary Care Aivy Akter: Norleen Lynwood Other Clinician: Referring Kayleena Eke: Treating Bobetta Korf/Extender: Marolyn Delon Norleen Lynwood Devra in Treatment: 12 Encounter Discharge Information Items Post Procedure Vitals Discharge  Condition: Stable Temperature (F): 97.6 Ambulatory Status: Ambulatory Pulse (bpm): 71 Discharge Destination: Home Respiratory Rate (breaths/min): 18 Transportation: Private Auto Blood Pressure (mmHg): 146/87 Accompanied By: self Schedule Follow-up Appointment: Yes Clinical Summary of Care: Patient Declined Electronic Signature(s) Signed: 03/14/2023 11:23:18 AM By: Claven Pollen RN Entered By: Claven Pollen on 03/13/2023 17:32:32 [name] ([id number]) [phone].pdf Page 2 of 7 -------------------------------------------------------------------------------- Lower Extremity Assessment Details Patient Name: Date of Service: [name] MES L. 03/13/2023 1:00 PM Medical Record Number: [id number] Patient Account Number: [id number] Date of Birth/Sex: Treating RN: [birth year] (78 y.o. [name] Primary Care Anjali Manzella: Norleen Lynwood Other Clinician: Referring Lorenda Grecco: Treating Blessen Kimbrough/Extender: Marolyn Delon Norleen Lynwood Devra in Treatment: 12 Edema Assessment Assessed: [name]: No] [name]: No] Edema: [Left: N] [Right: o] Calf Left: Right: Point of Measurement: 29 cm From Medial Instep 35.5 cm Ankle Left: Right: Point of Measurement: 10 cm From Medial Instep 21 cm Vascular Assessment Pulses: Dorsalis Pedis Palpable: [Right:Yes] Extremity colors, hair growth, and conditions: Extremity Color: [Right:Normal] Hair Growth on Extremity: [Right:Yes] Temperature of Extremity: [Right:Warm] Capillary Refill: [Right:< 3 seconds] Dependent Rubor: [Right:No No] Electronic Signature(s) Signed: 03/14/2023 11:23:18 AM By: Claven Pollen RN Entered By: Claven Pollen on 03/13/2023 13:56:53 -------------------------------------------------------------------------------- Multi Wound Chart Details Patient Name: Date of Service: [name] MES L. 03/13/2023 1:00 PM Medical Record Number: [id number] Patient Account Number: [id number] Date of Birth/Sex: Treating  RN: February 24, 1946 (78 y.o. M) Primary Care Puneet Masoner: Norleen Lynwood Other Clinician: Referring Florentina Marquart: Treating Commodore Bellew/Extender: Marolyn Delon Norleen Lynwood Devra in Treatment: 12 Vital Signs Height(in): 70 Pulse(bpm): 85 Weight(lbs): 186 Blood Pressure(mmHg): 96/62 Body Mass Index(BMI): 26.7 Temperature(F): 97.6 Respiratory Rate(breaths/min): 18 [1:Photos:] [N/A:N/A] Right, Medial, Plantar Foot N/A N/A Wound Location: Gradually Appeared N/A N/A Wounding Event: Diabetic Wound/Ulcer of the Lower N/A N/A Primary Etiology: Extremity Cataracts, Arrhythmia, Congestive N/A N/A Comorbid History: Heart Failure, Coronary Artery Disease, Hypertension, Type II Diabetes, Neuropathy 05/30/2022 N/A N/A Date Acquired: 12 N/A N/A Weeks of Treatment: Open N/A N/A Wound Status: No N/A N/A Wound  Recurrence: 0.8x0.8x0.1 N/A N/A Measurements L x W x D (cm) 0.503 N/A N/A A (cm) : rea 0.05 N/A N/A Volume (cm) : 50.70% N/A N/A % Reduction in A rea: 83.70% N/A N/A % Reduction in Volume: Grade 2 N/A N/A Classification: Medium N/A N/A Exudate A mount: Serosanguineous N/A N/A Exudate Type: red, brown N/A N/A Exudate Color: Thickened N/A N/A Wound Margin: Large (67-100%) N/A N/A Granulation A mount: Red N/A N/A Granulation Quality: Small (1-33%) N/A N/A Necrotic A mount: Fat Layer (Subcutaneous Tissue): Yes N/A N/A Exposed Structures: Fascia: No Tendon: No Muscle: No Joint: No Bone: No Small (1-33%) N/A N/A Epithelialization: Debridement - Excisional N/A N/A Debridement: Pre-procedure Verification/Time Out 14:05 N/A N/A Taken: Lidocaine  4% Topical Solution N/A N/A Pain Control: Callus, Subcutaneous, Slough N/A N/A Tissue Debrided: Skin/Subcutaneous Tissue N/A N/A Level: 0.5 N/A N/A Debridement A (sq cm): rea Curette N/A N/A Instrument: Minimum N/A N/A Bleeding: Pressure N/A N/A Hemostasis A chieved: Procedure was tolerated well N/A N/A Debridement Treatment  Response: 0.8x0.8x0.1 N/A N/A Post Debridement Measurements L x W x D (cm) 0.05 N/A N/A Post Debridement Volume: (cm) Callus: Yes N/A N/A Periwound Skin Texture: Maceration: Yes N/A N/A Periwound Skin Moisture: Dry/Scaly: No No Abnormalities Noted N/A N/A Periwound Skin Color: No Abnormality N/A N/A Temperature: Debridement N/A N/A Procedures Performed: Treatment Notes Electronic Signature(s) Signed: 03/13/2023 2:19:25 PM By: Marolyn Nest MD FACS Entered By: Marolyn Nest on 03/13/2023 14:19:25 -------------------------------------------------------------------------------- Multi-Disciplinary Care Plan Details Patient Name: Date of Service: [name] MES L. 03/13/2023 1:00 PM Medical Record Number: [id number] Patient Account Number: [id number] Date of Birth/Sex: Treating RN: 1945/08/03 (78 y.o. [name] Primary Care Chantavia Bazzle: Norleen Agent Other Clinician: [place] AGENT [name] ([id number]) [id number] Page 4 of 7 Referring Lyrik Buresh: Treating Olanda Downie/Extender: Marolyn Nest Norleen Agent Devra in Treatment: 12 Multidisciplinary Care Plan reviewed with physician Active Inactive Nutrition Nursing Diagnoses: Impaired glucose control: actual or potential Potential for alteration in Nutrition/Potential for imbalanced nutrition Goals: Patient/caregiver will maintain therapeutic glucose control Date Initiated: 02/20/2023 Target Resolution Date: 04/28/2023 Goal Status: Active Interventions: Assess patient nutrition upon admission and as needed per policy Provide education on elevated blood sugars and impact on wound healing Treatment Activities: Patient referred to Primary Care Physician for further nutritional evaluation : 02/20/2023 Notes: Wound/Skin Impairment Nursing Diagnoses: Impaired tissue integrity Goals: Patient/caregiver will verbalize understanding of skin care regimen Date Initiated: 12/16/2022 Target Resolution Date:  05/26/2023 Goal Status: Active Interventions: Assess ulceration(s) every visit Treatment Activities: Skin care regimen initiated : 12/16/2022 Notes: Electronic Signature(s) Signed: 03/14/2023 11:23:18 AM By: Claven Pollen RN Entered By: Claven Pollen on 03/13/2023 17:31:17 -------------------------------------------------------------------------------- Pain Assessment Details Patient Name: Date of Service: [name] MES L. 03/13/2023 1:00 PM Medical Record Number: [id number] Patient Account Number: [id number] Date of Birth/Sex: Treating RN: 12-27-1945 (78 y.o. M) Primary Care Neera Teng: Norleen Agent Other Clinician: Referring Kelcee Bjorn: Treating Trellis Guirguis/Extender: Marolyn Nest Norleen Agent Devra in Treatment: 12 Active Problems Location of Pain Severity and Description of Pain Patient Has Paino No Site Locations [place], [place] L ([id number]) [phone].pdf Page 5 of 7 Pain Management and Medication Current Pain Management: Electronic Signature(s) Signed: 03/13/2023 3:55:20 PM By: Okey Bonier Entered By: Okey Bonier on 03/13/2023 13:44:10 -------------------------------------------------------------------------------- Patient/Caregiver Education Details Patient Name: Date of Service: [name] MES L. 1/13/2025andnbsp1:00 PM Medical Record Number: [id number] Patient Account Number: [id number] Date of Birth/Gender: Treating RN: Jun 22, 1945 (77 y.o. [name] Primary Care Physician: Norleen Agent Other Clinician: Referring Physician: Treating Physician/Extender: Marolyn Nest Norleen Agent Devra in Treatment:  12 Education Assessment Education Provided To: Patient Education Topics Provided Wound/Skin Impairment: Methods: Explain/Verbal Responses: State content correctly Electronic Signature(s) Signed: 03/14/2023 11:23:18 AM By: Claven Pollen RN Entered By: Claven Pollen on 03/13/2023  17:31:32 -------------------------------------------------------------------------------- Wound Assessment Details Patient Name: Date of Service: [name] MES L. 03/13/2023 1:00 PM Medical Record Number: [id number] Patient Account Number: [id number] Date of Birth/Sex: Treating RN: Feb 09, 1946 (78 y.o. M) Primary Care Selby Slovacek: Norleen Agent Other Clinician: Referring Shanekqua Schaper: Treating Shelbie Franken/Extender: Leman, Martinek ([id number]) [id number] Page 6 of 7 Weeks in Treatment: 12 Wound Status Wound Number: 1 Primary Diabetic Wound/Ulcer of the Lower Extremity Etiology: Wound Location: Right, Medial, Plantar Foot Wound Open Wounding Event: Gradually Appeared Status: Date Acquired: 05/30/2022 Comorbid Cataracts, Arrhythmia, Congestive Heart Failure, Coronary Artery Weeks Of Treatment: 12 History: Disease, Hypertension, Type II Diabetes, Neuropathy Clustered Wound: No Photos Wound Measurements Length: (cm) 0.8 Width: (cm) 0.8 Depth: (cm) 0.1 Area: (cm) 0.503 Volume: (cm) 0.05 % Reduction in Area: 50.7% % Reduction in Volume: 83.7% Epithelialization: Small (1-33%) Tunneling: No Undermining: No Wound Description Classification: Grade 2 Wound Margin: Thickened Exudate Amount: Medium Exudate Type: Serosanguineous Exudate Color: red, brown Foul Odor After Cleansing: No Slough/Fibrino Yes Wound Bed Granulation Amount: Large (67-100%) Exposed Structure Granulation Quality: Red Fascia Exposed: No Necrotic Amount: Small (1-33%) Fat Layer (Subcutaneous Tissue) Exposed: Yes Necrotic Quality: Adherent Slough Tendon Exposed: No Muscle Exposed: No Joint Exposed: No Bone Exposed: No Periwound Skin Texture Texture Color No Abnormalities Noted: No No Abnormalities Noted: Yes Callus: Yes Temperature / Pain Temperature: No Abnormality Moisture No Abnormalities Noted: No Dry / Scaly: No Maceration: Yes Treatment Notes Wound #1  (Foot) Wound Laterality: Plantar, Right, Medial Cleanser Soap and Water Discharge Instruction: May shower and wash wound with dial  antibacterial soap and water prior to dressing change. Vashe 5.8 (oz) Discharge Instruction: Or Cleanse the wound with Vashe prior to applying a clean dressing using gauze sponges, not tissue or cotton balls. Peri-Wound Care Zinc Oxide Ointment 30g tube Discharge Instruction: Apply Zinc Oxide to periwound with each dressing change Topical Gentamicin  MITCH, ARQUETTE ([id number]) [phone].pdf Page 7 of 7 Discharge Instruction: As directed by physician Mupirocin  Ointment Discharge Instruction: Apply Mupirocin  (Bactroban ) as instructed Primary Dressing Promogran Prisma Matrix, 4.34 (sq in) (silver collagen) Discharge Instruction: Moisten collagen with saline or hydrogel Secondary Dressing Optifoam Non-Adhesive Dressing, 4x4 in Discharge Instruction: Apply over primary dressing cut to make foam donut Woven Gauze Sponges 2x2 in Discharge Instruction: Apply over primary dressing as directed. Zetuvit Plus Silicone Border Dressing 4x4 (in/in) Discharge Instruction: Apply silicone border over primary dressing as directed. Secured With Compression Wrap Compression Stockings Facilities Manager) Signed: 03/14/2023 11:23:18 AM By: Claven Pollen RN Entered By: Claven Pollen on 03/13/2023 13:59:03 -------------------------------------------------------------------------------- Vitals Details Patient Name: Date of Service: [name] MES L. 03/13/2023 1:00 PM Medical Record Number: [id number] Patient Account Number: [id number] Date of Birth/Sex: Treating RN: 1945/04/29 (78 y.o. M) Primary Care Dagen Beevers: Norleen Agent Other Clinician: Referring Kadey Mihalic: Treating Alson Mcpheeters/Extender: Marolyn Delon Norleen Agent Devra in Treatment: 12 Vital Signs Time Taken: 01:43 Temperature (F): 97.6 Height (in): 70 Pulse (bpm): 85 Weight  (lbs): 186 Respiratory Rate (breaths/min): 18 Body Mass Index (BMI): 26.7 Blood Pressure (mmHg): 96/62 Reference Range: 80 - 120 mg / dl Electronic Signature(s) Signed: 03/13/2023 3:55:20 PM By: Okey Bonier Entered By: Okey Bonier on 03/13/2023 13:44:05

## 2023-03-15 NOTE — Progress Notes (Signed)
 ALEXSANDER, CAVINS ([id number]) [id number] Page 1 of 8 Visit Report for 03/13/2023 Chief Complaint Document Details Patient Name: Date of Service: [name], [name] MES [name]. 03/13/2023 1:00 PM Medical Record Number: [id number] Patient Account Number: [id number] Date of Birth/Sex: Treating RN: 1945/03/16 (78 y.o. M) Primary Care Provider: Norleen Lynwood Other Clinician: Referring Provider: Treating Provider/Extender: Marolyn Delon Norleen Lynwood Devra in Treatment: 12 Information Obtained from: Patient Chief Complaint Patients presents for treatment of an open diabetic ulcer Electronic Signature(s) Signed: 03/13/2023 2:19:42 PM By: Marolyn Delon MD FACS Entered By: Marolyn Delon on 03/13/2023 14:19:42 -------------------------------------------------------------------------------- Debridement Details Patient Name: Date of Service: [name] MES [name]. 03/13/2023 1:00 PM Medical Record Number: [id number] Patient Account Number: [id number] Date of Birth/Sex: Treating RN: August 31, 1945 (78 y.o. [name] Primary Care Provider: Norleen Lynwood Other Clinician: Referring Provider: Treating Provider/Extender: Marolyn Delon Norleen Lynwood Devra in Treatment: 12 Debridement Performed for Assessment: Wound #1 Right,Medial,Plantar Foot Performed By: Physician Marolyn Delon, MD The following information was scribed by: Claven Pollen The information was scribed for: Marolyn Delon Debridement Type: Debridement Severity of Tissue Pre Debridement: Fat layer exposed Level of Consciousness (Pre-procedure): Awake and Alert Pre-procedure Verification/Time Out Yes - 14:05 Taken: Start Time: 14:05 Pain Control: Lidocaine  4% T opical Solution Percent of Wound Bed Debrided: 100% T Area Debrided (cm): otal 0.5 Tissue and other material debrided: Viable, Non-Viable, Callus, Slough, Subcutaneous, Slough Level: Skin/Subcutaneous Tissue Debridement Description:  Excisional Instrument: Curette Specimen: Tissue Culture Number of Specimens T aken: 1 Bleeding: Minimum Hemostasis Achieved: Pressure Response to Treatment: Procedure was tolerated well Level of Consciousness (Post- Awake and Alert procedure): Post Debridement Measurements of Total Wound Length: (cm) 0.8 Width: (cm) 0.8 Depth: (cm) 0.1 Volume: (cm) 0.05 Edgin, [name] ([id number]) [id number].pdf Page 2 of 8 Character of Wound/Ulcer Post Debridement: Improved Severity of Tissue Post Debridement: Fat layer exposed Post Procedure Diagnosis Same as Pre-procedure Electronic Signature(s) Signed: 03/13/2023 4:43:23 PM By: Marolyn Delon MD FACS Signed: 03/14/2023 11:23:18 AM By: Claven Pollen RN Entered By: Claven Pollen on 03/13/2023 14:11:08 -------------------------------------------------------------------------------- HPI Details Patient Name: Date of Service: [name] MES [name]. 03/13/2023 1:00 PM Medical Record Number: [id number] Patient Account Number: [id number] Date of Birth/Sex: Treating RN: 10-Sep-1945 (78 y.o. M) Primary Care Provider: Norleen Lynwood Other Clinician: Referring Provider: Treating Provider/Extender: Marolyn Delon Norleen Lynwood Devra in Treatment: 12 History of Present Illness HPI Description: ADMISSION 12/16/2022 ***ABIs [name]: 0.95; R: 0.88*** This is a 78 year old type II diabetic (last hemoglobin A1c 7.2% on November 20, 2022) with a fairly extensive cardiac and peripheral vascular history. 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 Most recently, he has been followed by a podiatrist in [place] for right and left diabetic foot ulcers on the first metatarsal head. He says they were at first applying some kind of cream, the name of which he does not remember and have now been applying what he describes as a some white stuff. Due to failure of the wounds to heal, he was referred to the wound care  center for further evaluation and management. 12/29/2022: The left foot wound is healed. The right first metatarsal head wound is surrounded with macerated callus and skin. There is some slough on the surface. 01/12/2023: The tissue maceration is much better this week. There is an odor coming from the wound, however. The wound is stable in size. 01/19/2023: The culture that I took last week grew out multiple species, including Enterococcus, strep, staph, and small amounts of Pseudomonas. I prescribed  Augmentin  and he is currently taking this. The wound itself is looking a little bit better. Less maceration and a healthier appearing surface. The odor has abated. 01/24/2023: The wound is about the same size today, but the overall appearance is improved. There is no tissue maceration. The surface has a layer of slough overlying the granulation tissue. No malodor or purulent drainage. 12/5; the patient has a small wound on the right first metatarsal head. There is nothing open on the left side. This is a diabetic foot ulcer we have been using gentamicin  mupirocin  and silver alginate. 12/10; this is a small wound on the right first metatarsal head. Unfortunately not much different this week. 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 We have been using gent, mupirocin , silver alginate. 02/20/2023: No significant change in the wound dimensions. There is a fair amount of callus accumulation this week. His forefoot offloading shoe is pretty battered. 03/06/2023: There is a continued lack of progress in this wound. He continues to accumulate callus around the site despite wearing a forefoot offloading sandal. The wound dimensions are basically unchanged although the tissue appears quite healthy. 03/13/2023: There has been fairly substantial callus accumulation around the wound. There is a blue-green discoloration to the periwound  tissue, as well. There is slough on the wound surface. No real change to the wound dimensions. Electronic Signature(s) Signed: 03/13/2023 2:20:37 PM By: Marolyn Nest MD FACS Entered By: Marolyn Nest on 03/13/2023 14:20:37 Physical Exam Details -------------------------------------------------------------------------------- [name] LYNWOOD CROME ([id number]) [id number] Page 3 of 8 Patient Name: Date of Service: [name], [name] MES [name]. 03/13/2023 1:00 PM Medical Record Number: [id number] Patient Account Number: [id number] Date of Birth/Sex: Treating RN: 16-Oct-1945 (78 y.o. M) Primary Care Provider: Norleen Lynwood Other Clinician: Referring Provider: Treating Provider/Extender: Marolyn Nest Norleen Lynwood Devra in Treatment: 12 Constitutional Hypotensive, but normal for this patient. . . . no acute distress. Respiratory Normal work of breathing on room air.. Notes 03/13/2023: There has been fairly substantial callus accumulation around the wound. There is a blue-green discoloration to the periwound tissue, as well. There is slough on the wound surface. No real change to the wound dimensions. Electronic Signature(s) Signed: 03/13/2023 2:21:10 PM By: Marolyn Nest MD FACS Entered By: Marolyn Nest on 03/13/2023 14:21:10 -------------------------------------------------------------------------------- Physician Orders Details Patient Name: Date of Service: [name] MES [name]. 03/13/2023 1:00 PM Medical Record Number: [id number] Patient Account Number: [id number] Date of Birth/Sex: Treating RN: 1945/04/27 (78 y.o. 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 - Dr. Marolyn Return A 03/20/23 @ 1:00 pm Anesthetic (In clinic) Topical Lidocaine  5% applied to wound bed Bathing/ Shower/ Hygiene May shower and wash wound with soap and water. - remove dressing prior to shower Off-Loading Wound #1 Right,Medial,Plantar Foot Wedge shoe to: - right foot - wear when walking, try to keep pressure off wound area at all times Wound Treatment Wound #1 - Foot Wound Laterality: Plantar, Right, Medial Cleanser: Soap and Water 1 x Per Day/30 Days Discharge Instructions: May shower and wash wound with  dial  antibacterial soap and water prior to dressing change. Cleanser: Vashe 5.8 (oz) 1 x Per Day/30 Days Discharge Instructions: Or Cleanse the wound with Vashe prior to applying a clean dressing using gauze sponges, not tissue or cotton balls. Peri-Wound Care: Zinc Oxide Ointment 30g tube 1 x Per Day/30 Days Discharge Instructions: Apply Zinc Oxide to periwound with each dressing change INDALECIO, MALMSTROM ([id number]) [phone].pdf Page 4 of 8 Topical: Gentamicin  1 x Per Day/30 Days Discharge Instructions: As directed by physician Topical: Mupirocin  Ointment 1 x Per Day/30 Days Discharge Instructions: Apply Mupirocin  (Bactroban ) as instructed Prim Dressing: Promogran Prisma Matrix, 4.34 (sq in) (silver collagen) 1 x Per Day/30 Days ary Discharge Instructions: Moisten collagen with saline or hydrogel Secondary Dressing: Optifoam Non-Adhesive Dressing, 4x4 in 1 x Per Day/30 Days Discharge Instructions: Apply over primary dressing cut to make foam donut Secondary Dressing: Woven Gauze Sponges 2x2 in 1 x Per Day/30 Days Discharge Instructions: Apply over primary dressing as directed. Secondary Dressing: Zetuvit Plus Silicone Border Dressing 4x4 (in/in)  (Dispense As Written) 1 x Per Day/30 Days Discharge Instructions: Apply silicone border over primary dressing as directed. Laboratory naerobe culture (MICRO) - Right Medial Plantar foot -Culture - (ICD10 E11.621 - Type 2 diabetes Bacteria identified in Unspecified specimen by A mellitus with foot ulcer) LOINC Code: 635-3 Convenience Name: Anaerobic culture Electronic Signature(s) Signed: 03/13/2023 4:43:23 PM By: Marolyn Nest MD FACS Signed: 03/14/2023 11:23:18 AM By: Claven Pollen RN Entered By: Claven Pollen on 03/13/2023 14:26:45 -------------------------------------------------------------------------------- Problem List Details Patient Name: Date of Service: [name] MES [name]. 03/13/2023 1:00 PM Medical Record Number: [id number] Patient Account Number: [id number] Date of Birth/Sex: Treating RN: 15-Mar-1945 (79 y.o. 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 12/16/2022 No Yes Inactive Problems ICD-10 ORLYN, ODONOGHUE ([id number]) [phone].pdf Page 5 of 8 Code Description Active Date Inactive Date L84 Corns and callosities 12/16/2022 12/16/2022 Resolved Problems ICD-10 Code Description Active Date Resolved Date L97.521 Non-pressure chronic ulcer of other part of left foot limited to breakdown of skin 12/16/2022 12/16/2022 Electronic Signature(s) Signed: 03/13/2023 2:19:18 PM By: Marolyn Nest MD FACS Entered By: Marolyn Nest on 03/13/2023  14:19:18 -------------------------------------------------------------------------------- Progress Note Details Patient Name: Date of Service: [name] MES [name]. 03/13/2023 1:00 PM Medical Record Number: [id number] Patient Account Number: [id number] Date of Birth/Sex: Treating RN: 16-Sep-1945 (78 y.o. M) Primary Care Provider: Norleen Lynwood Other Clinician: Referring Provider: Treating Provider/Extender: Marolyn Nest Norleen Lynwood Devra in Treatment: 12 Subjective Chief Complaint Information obtained from Patient Patients presents for treatment of an open diabetic ulcer History of Present Illness (HPI) ADMISSION 12/16/2022 ***ABIs [name]: 0.95; R: 0.88*** This is a 78 year old type II diabetic (last hemoglobin A1c 7.2% on November 20, 2022) with a fairly extensive cardiac and peripheral vascular history. 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 Most recently, he has been followed by a podiatrist in [place] for right and left diabetic foot ulcers on the first metatarsal head. He says they were at first applying some kind of cream, the name of which he does not remember and have now been applying what  he describes as a some white stuff. Due to failure of the wounds to heal, he was referred to the wound care center for further evaluation and management. 12/29/2022: The left foot wound is healed. The right first metatarsal head wound is surrounded with macerated callus and skin. There is some slough on the surface. 01/12/2023: The tissue maceration is much better this week. There is an odor coming from the wound, however. The wound is stable in size. 01/19/2023: The culture that I took last week grew out multiple species, including Enterococcus, strep, staph, and small amounts of Pseudomonas. I prescribed Augmentin  and he is currently taking this. The wound itself is looking a little bit better. Less maceration and a healthier appearing surface. The odor  has abated. 01/24/2023: The wound is about the same size today, but the overall appearance is improved. There is no tissue maceration. The surface has a layer of slough overlying the granulation tissue. No malodor or purulent drainage. 12/5; the patient has a small wound on the right first metatarsal head. There is nothing open on the left side. This is a diabetic foot ulcer we have been using gentamicin  mupirocin  and silver alginate. 12/10; this is a small wound on the right first metatarsal head. Unfortunately not much different this week. 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 We have been using gent, mupirocin , silver alginate. 02/20/2023: No significant change in the wound dimensions. There is a fair amount of callus accumulation this week. His forefoot offloading shoe is pretty battered. 03/06/2023: There is a continued lack of progress in this wound. He continues to accumulate callus around the site despite wearing a forefoot offloading sandal. The wound dimensions are basically unchanged although the tissue appears quite healthy. 03/13/2023: There has been fairly substantial callus accumulation around the wound. There is a blue-green discoloration to the periwound tissue, as well. There is slough on the wound surface. No real change to the wound dimensions. [name], [name] ([id number]) [id number] Page 6 of 8 Objective Constitutional Hypotensive, but normal for this patient. no acute distress. Vitals Time Taken: 1:43 AM, Height: 70 in, Weight: 186 lbs, BMI: 26.7, Temperature: 97.6 F, Pulse: 85 bpm, Respiratory Rate: 18 breaths/min, Blood Pressure: 96/62 mmHg. Respiratory Normal work of breathing on room air.. General Notes: 03/13/2023: There has been fairly substantial callus accumulation around the wound. There is a blue-green discoloration to the periwound tissue, as well. There is  slough on the wound surface. No real change to the wound dimensions. Integumentary (Hair, Skin) Wound #1 status is Open. Original cause of wound was Gradually Appeared. The date acquired was: 05/30/2022. The wound has been in treatment 12 weeks. The wound is located on the Right,Medial,Plantar Foot. The wound measures 0.8cm length x 0.8cm width x 0.1cm depth; 0.503cm^2 area and 0.05cm^3 volume. There is Fat Layer (Subcutaneous Tissue) exposed. There is no tunneling or undermining noted. There is a medium amount of serosanguineous drainage noted. The wound margin is thickened. There is large (67-100%) red granulation within the wound bed. There is a small (1-33%) amount of necrotic tissue within the wound bed including Adherent Slough. The periwound skin appearance had no abnormalities noted for color. The periwound skin appearance exhibited: Callus, Maceration. The periwound skin appearance did not exhibit: Dry/Scaly. Periwound temperature was noted as No Abnormality. Assessment Active Problems ICD-10 Non-pressure chronic ulcer of other part of right foot with fat layer exposed Type 2 diabetes  mellitus with foot ulcer Peripheral vascular disease, unspecified Type 2 diabetes mellitus with other diabetic neurological complication Procedures Wound #1 Pre-procedure diagnosis of Wound #1 is a Diabetic Wound/Ulcer of the Lower Extremity located on the Right,Medial,Plantar Foot .Severity of Tissue Pre Debridement is: Fat layer exposed. There was a Excisional Skin/Subcutaneous Tissue Debridement with a total area of 0.5 sq cm performed by Marolyn Nest, MD. With the following instrument(s): Curette to remove Viable and Non-Viable tissue/material. Material removed includes Callus, Subcutaneous Tissue, and Slough after achieving pain control using Lidocaine  4% T opical Solution. 1 specimen was taken by a Tissue Culture and sent to the lab per facility protocol. A time out was conducted at 14:05, prior to  the start of the procedure. A Minimum amount of bleeding was controlled with Pressure. The procedure was tolerated well. Post Debridement Measurements: 0.8cm length x 0.8cm width x 0.1cm depth; 0.05cm^3 volume. Character of Wound/Ulcer Post Debridement is improved. Severity of Tissue Post Debridement is: Fat layer exposed. Post procedure Diagnosis Wound #1: Same as Pre-Procedure Plan Follow-up Appointments: Return Appointment in 1 week. - Dr. Marolyn 03/20/23 @ 1:00 pm Anesthetic: (In clinic) Topical Lidocaine  5% applied to wound bed Bathing/ Shower/ Hygiene: May shower and wash wound with soap and water. - remove dressing prior to shower Off-Loading: Wound #1 Right,Medial,Plantar Foot: Wedge shoe to: - right foot - wear when walking, try to keep pressure off wound area at all times WOUND #1: - Foot Wound Laterality: Plantar, Right, Medial Cleanser: Soap and Water 1 x Per Day/30 Days Discharge Instructions: May shower and wash wound with dial  antibacterial soap and water prior to dressing change. Cleanser: Vashe 5.8 (oz) 1 x Per Day/30 Days Discharge Instructions: Or Cleanse the wound with Vashe prior to applying a clean dressing using gauze sponges, not tissue or cotton balls. Peri-Wound Care: Zinc Oxide Ointment 30g tube 1 x Per Day/30 Days Discharge Instructions: Apply Zinc Oxide to periwound with each dressing change Topical: Gentamicin  1 x Per Day/30 Days Discharge Instructions: As directed by physician Topical: Mupirocin  Ointment 1 x Per Day/30 Days Discharge Instructions: Apply Mupirocin  (Bactroban ) as instructed Prim Dressing: Promogran Prisma Matrix, 4.34 (sq in) (silver collagen) 1 x Per Day/30 Days ary Discharge Instructions: Moisten collagen with saline or hydrogel Secondary Dressing: Optifoam Non-Adhesive Dressing, 4x4 in 1 x Per Day/30 Days [name], [name] ([id number]) [phone].pdf Page 7 of 8 Discharge Instructions: Apply over primary dressing cut  to make foam donut Secondary Dressing: Woven Gauze Sponges 2x2 in 1 x Per Day/30 Days Discharge Instructions: Apply over primary dressing as directed. Secondary Dressing: Zetuvit Plus Silicone Border Dressing 4x4 (in/in) (Dispense As Written) 1 x Per Day/30 Days Discharge Instructions: Apply silicone border over primary dressing as directed. 03/13/2023: There has been fairly substantial callus accumulation around the wound. There is a blue-green discoloration to the periwound tissue, as well. There is slough on the wound surface. No real change to the wound dimensions. I used a curette to debride callus, slough, and subcutaneous tissue from the wound. I then took a culture, due to the blue-green discoloration. We will continue with topical gentamicin  and mupirocin  with Prisma silver collagen. He is wearing forefoot offloading shoes, but I am concerned that his transmetatarsal amputation really does not allow for adequate offloading in this foot wound. I would like to put him in a total contact cast, but he is reluctant due to the fact that the cast would interfere with his ability to drive. He will continue to think about it.  Once the data from his culture return, I will make appropriate antibiotic interventions as indicated. Follow-up in 1 week. Electronic Signature(s) Signed: 03/13/2023 2:23:04 PM By: Marolyn Nest MD FACS Entered By: Marolyn Nest on 03/13/2023 14:23:04 -------------------------------------------------------------------------------- SuperBill Details Patient Name: Date of Service: [name] MES [name]. 03/13/2023 Medical Record Number: [id number] Patient Account Number: [id number] Date of Birth/Sex: Treating RN: [birth year] (78 y.o. 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 Facility Procedures : CPT4 Code: [id number] Description: 11042 - DEB SUBQ TISSUE 20 SQ CM/< ICD-10 Diagnosis Description L97.512 Non-pressure chronic ulcer of other part of right foot with fat layer exposed Modifier: Quantity: 1 Physician Procedures : CPT4 Code Description Modifier [id number] - WC PHYS LEVEL 4 - EST PT ICD-10 Diagnosis Description L97.512 Non-pressure chronic ulcer of other part of right foot with fat layer exposed E11.621 Type 2 diabetes mellitus with foot ulcer I73.9 Peripheral  vascular disease, unspecified 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

## 2023-03-20 ENCOUNTER — Encounter (HOSPITAL_BASED_OUTPATIENT_CLINIC_OR_DEPARTMENT_OTHER): Payer: Medicare Other | Admitting: General Surgery

## 2023-03-20 DIAGNOSIS — I110 Hypertensive heart disease with heart failure: Secondary | ICD-10-CM | POA: Diagnosis not present

## 2023-03-20 DIAGNOSIS — L97512 Non-pressure chronic ulcer of other part of right foot with fat layer exposed: Secondary | ICD-10-CM | POA: Diagnosis not present

## 2023-03-20 DIAGNOSIS — E1149 Type 2 diabetes mellitus with other diabetic neurological complication: Secondary | ICD-10-CM | POA: Diagnosis not present

## 2023-03-20 DIAGNOSIS — E11621 Type 2 diabetes mellitus with foot ulcer: Secondary | ICD-10-CM | POA: Diagnosis not present

## 2023-03-20 DIAGNOSIS — I509 Heart failure, unspecified: Secondary | ICD-10-CM | POA: Diagnosis not present

## 2023-03-20 DIAGNOSIS — E1151 Type 2 diabetes mellitus with diabetic peripheral angiopathy without gangrene: Secondary | ICD-10-CM | POA: Diagnosis not present

## 2023-03-20 NOTE — Progress Notes (Signed)
[name], [name] ([id number]) [id number] Page 1 of 8 Visit Report for 03/20/2023 Arrival Information Details Patient Name: Date of Service: ATA, ABBITT MES L. 03/20/2023 1:00 PM Medical Record Number: [id number] Patient Account Number: [id number] Date of Birth/Sex: Treating RN: January 02, 1946 (78 y.o. [name] Primary Care Cicily Bonano: Oliver Barre Other Clinician: Referring Mieka Leaton: Treating Idus Rathke/Extender: Joeseph Amor in Treatment: 13 Visit Information History Since Last Visit Added or deleted any medications: No Patient Arrived: Ambulatory Any new allergies or adverse reactions: No Arrival Time: 13:04 Had a fall or experienced change in No Accompanied By: daughter activities of daily living that may affect Transfer Assistance: None risk of falls: Patient Identification Verified: Yes Signs or symptoms of abuse/neglect since last visito No Secondary Verification Process Completed: Yes Hospitalized since last visit: No Patient Requires Transmission-Based Precautions: No Implantable device outside of the clinic excluding No Patient Has Alerts: Yes cellular tissue based products placed in the center Patient Alerts: Patient on Blood Thinner since last visit: Xarelto Has Dressing in Place as Prescribed: Yes Has Footwear/Offloading in Place as Prescribed: Yes Right: Wedge Shoe Pain Present Now: No Electronic Signature(s) Signed: 03/20/2023 5:16:53 PM By: Zenaida Deed RN, BSN Entered By: Zenaida Deed on 03/20/2023 13:08:48 -------------------------------------------------------------------------------- Encounter Discharge Information Details Patient Name: Date of Service: [name] MES L. 03/20/2023 1:00 PM Medical Record Number: [id number] Patient Account Number: [id number] Date of Birth/Sex: Treating RN: 08-21-1945 (78 y.o. [name] Primary Care Katriona Schmierer: Oliver Barre Other Clinician: Referring Zulema Pulaski: Treating  Felicie Kocher/Extender: Joeseph Amor in Treatment: 13 Encounter Discharge Information Items Post Procedure Vitals Discharge Condition: Stable Temperature (F): 97.9 Ambulatory Status: Ambulatory Pulse (bpm): 78 Discharge Destination: Home Respiratory Rate (breaths/min): 18 Transportation: Private Auto Blood Pressure (mmHg): 105/67 Accompanied By: daughter Schedule Follow-up Appointment: Yes Clinical Summary of Care: Patient Declined Electronic Signature(s) Signed: 03/20/2023 5:16:53 PM By: Zenaida Deed RN, BSN Entered By: Zenaida Deed on 03/20/2023 13:47:27 Weber, [name] ([id number]) [phone].pdf Page 2 of 8 -------------------------------------------------------------------------------- Lower Extremity Assessment Details Patient Name: Date of Service: [name], [name] MES L. 03/20/2023 1:00 PM Medical Record Number: [id number] Patient Account Number: [id number] Date of Birth/Sex: Treating RN: 1945-06-27 (78 y.o. [name] Primary Care Myca Perno: Oliver Barre Other Clinician: Referring Eziah Negro: Treating Danyl Deems/Extender: Joeseph Amor in Treatment: 13 Edema Assessment Assessed: Kyra Searles: No] Franne Forts: No] Edema: [Left: N] [Right: o] Calf Left: Right: Point of Measurement: 29 cm From Medial Instep 35.5 cm Ankle Left: Right: Point of Measurement: 10 cm From Medial Instep 21 cm Vascular Assessment Pulses: Dorsalis Pedis Palpable: [Right:Yes] Extremity colors, hair growth, and conditions: Extremity Color: [Right:Normal] Hair Growth on Extremity: [Right:Yes] Temperature of Extremity: [Right:Warm] Capillary Refill: [Right:< 3 seconds] Dependent Rubor: [Right:No No] Electronic Signature(s) Signed: 03/20/2023 5:16:53 PM By: Zenaida Deed RN, BSN Entered By: Zenaida Deed on 03/20/2023 13:13:14 -------------------------------------------------------------------------------- Multi Wound Chart Details Patient  Name: Date of Service: [name] MES L. 03/20/2023 1:00 PM Medical Record Number: [id number] Patient Account Number: [id number] Date of Birth/Sex: Treating RN: 22-Jan-1946 (78 y.o. M) Primary Care Deziya Amero: Oliver Barre Other Clinician: Referring Malin Sambrano: Treating Keishawna Carranza/Extender: Joeseph Amor in Treatment: 13 Vital Signs Height(in): 70 Pulse(bpm): 78 Weight(lbs): 186 Blood Pressure(mmHg): 105/67 Body Mass Index(BMI): 26.7 Temperature(F): 97.9 Respiratory Rate(breaths/min): 18 [1:Photos:] [N/A:N/A] Right, Medial, Plantar Foot N/A N/A Wound Location: Gradually Appeared N/A N/A Wounding Event: Diabetic Wound/Ulcer of the Lower N/A N/A Primary Etiology: Extremity Cataracts, Arrhythmia, Congestive N/A N/A Comorbid History: Heart Failure, Coronary Artery Disease, Hypertension, Type  II Diabetes, Neuropathy 05/30/2022 N/A N/A Date Acquired: 13 N/A N/A Weeks of Treatment: Open N/A N/A Wound Status: No N/A N/A Wound Recurrence: 0.6x0.8x0.1 N/A N/A Measurements L x W x D (cm) 0.377 N/A N/A A (cm) : rea 0.038 N/A N/A Volume (cm) : 63.10% N/A N/A % Reduction in A rea: 87.60% N/A N/A % Reduction in Volume: Grade 2 N/A N/A Classification: Medium N/A N/A Exudate A mount: Serosanguineous N/A N/A Exudate Type: red, brown N/A N/A Exudate Color: Thickened N/A N/A Wound Margin: Large (67-100%) N/A N/A Granulation A mount: Pink, Pale N/A N/A Granulation Quality: Small (1-33%) N/A N/A Necrotic A mount: Fat Layer (Subcutaneous Tissue): Yes N/A N/A Exposed Structures: Fascia: No Tendon: No Muscle: No Joint: No Bone: No Small (1-33%) N/A N/A Epithelialization: Debridement - Excisional N/A N/A Debridement: Pre-procedure Verification/Time Out 13:20 N/A N/A Taken: Lidocaine 4% Topical Solution N/A N/A Pain Control: Callus, Subcutaneous, Slough N/A N/A Tissue Debrided: Skin/Subcutaneous Tissue N/A N/A Level: 1.13 N/A N/A Debridement A (sq  cm): rea Curette N/A N/A Instrument: Minimum N/A N/A Bleeding: Pressure N/A N/A Hemostasis A chieved: 0 N/A N/A Procedural Pain: 0 N/A N/A Post Procedural Pain: Procedure was tolerated well N/A N/A Debridement Treatment Response: 0.8x1.2x0.2 N/A N/A Post Debridement Measurements L x W x D (cm) 0.151 N/A N/A Post Debridement Volume: (cm) Callus: Yes N/A N/A Periwound Skin Texture: Maceration: No N/A N/A Periwound Skin Moisture: Dry/Scaly: No No Abnormalities Noted N/A N/A Periwound Skin Color: No Abnormality N/A N/A Temperature: Debridement N/A N/A Procedures Performed: T Contact Cast otal Treatment Notes Electronic Signature(s) Signed: 03/20/2023 1:29:12 PM By: Duanne Guess MD FACS Entered By: Duanne Guess on 03/20/2023 13:29:12 -------------------------------------------------------------------------------- Multi-Disciplinary Care Plan Details Patient Name: Date of Service: [name] MES L. 03/20/2023 1:00 PM Medical Record Number: [id number] Patient Account Number: [id number] Date of Birth/Sex: Treating RN: 1945/10/10 (78 y.o. [name] Primary Care Abigail Marsiglia: Oliver Barre Other Clinician: Johnney Killian ([id number]) [id number] Page 4 of 8 Referring Harlyn Rathmann: Treating Leisel Pinette/Extender: Joeseph Amor in Treatment: 13 Multidisciplinary Care Plan reviewed with physician Active Inactive Nutrition Nursing Diagnoses: Impaired glucose control: actual or potential Potential for alteration in Nutrition/Potential for imbalanced nutrition Goals: Patient/caregiver will maintain therapeutic glucose control Date Initiated: 02/20/2023 Target Resolution Date: 04/28/2023 Goal Status: Active Interventions: Assess patient nutrition upon admission and as needed per policy Provide education on elevated blood sugars and impact on wound healing Treatment Activities: Patient referred to Primary Care Physician for  further nutritional evaluation : 02/20/2023 Notes: Wound/Skin Impairment Nursing Diagnoses: Impaired tissue integrity Goals: Patient/caregiver will verbalize understanding of skin care regimen Date Initiated: 12/16/2022 Target Resolution Date: 05/26/2023 Goal Status: Active Interventions: Assess ulceration(s) every visit Treatment Activities: Skin care regimen initiated : 12/16/2022 Notes: Electronic Signature(s) Signed: 03/20/2023 5:16:53 PM By: Zenaida Deed RN, BSN Entered By: Zenaida Deed on 03/20/2023 13:15:38 -------------------------------------------------------------------------------- Pain Assessment Details Patient Name: Date of Service: [name] MES L. 03/20/2023 1:00 PM Medical Record Number: [id number] Patient Account Number: [id number] Date of Birth/Sex: Treating RN: 1945-09-29 (78 y.o. [name] Primary Care Jaydian Santana: Oliver Barre Other Clinician: Referring Kenyette Gundy: Treating Rockey Guarino/Extender: Joeseph Amor in Treatment: 13 Active Problems Location of Pain Severity and Description of Pain Patient Has Paino No Site Locations Rate the pain. DEAMONTAE, ROUSER ([id number]) [id number] Page 5 of 8 Rate the pain. Current Pain Level: 0 Worst Pain Level: 3 Least Pain Level: 0 Character of Pain Describe the Pain: Sharp Pain Management and Medication Current Pain Management: Other:  tolerable Is the Current Pain Management Adequate: Adequate How does your wound impact your activities of daily livingo Sleep: Yes Bathing: No Appetite: No Relationship With Others: No Bladder Continence: No Emotions: No Bowel Continence: No Work: No Toileting: No Drive: No Dressing: No Hobbies: No Psychologist, prison and probation services) Signed: 03/20/2023 5:16:53 PM By: Zenaida Deed RN, BSN Entered By: Zenaida Deed on 03/20/2023  13:13:07 -------------------------------------------------------------------------------- Patient/Caregiver Education Details Patient Name: Date of Service: [name] MES L. 1/20/2025andnbsp1:00 PM Medical Record Number: [id number] Patient Account Number: [id number] Date of Birth/Gender: Treating RN: 1945/06/29 (78 y.o. [name] Primary Care Physician: Oliver Barre Other Clinician: Referring Physician: Treating Physician/Extender: Joeseph Amor in Treatment: 13 Education Assessment Education Provided To: Patient Education Topics Provided Elevated Blood Sugar/ Impact on Healing: Methods: Explain/Verbal Responses: Reinforcements needed, State content correctly Offloading: Methods: Explain/Verbal Responses: Reinforcements needed, State content correctly Wound/Skin Impairment: Methods: Explain/Verbal Responses: Reinforcements needed, State content correctly BENJAMAN, AZOULAY ([id number]) [phone].pdf Page 6 of 8 Electronic Signature(s) Signed: 03/20/2023 5:16:53 PM By: Zenaida Deed RN, BSN Entered By: Zenaida Deed on 03/20/2023 13:16:04 -------------------------------------------------------------------------------- Wound Assessment Details Patient Name: Date of Service: [name] MES L. 03/20/2023 1:00 PM Medical Record Number: [id number] Patient Account Number: [id number] Date of Birth/Sex: Treating RN: 1945/09/12 (78 y.o. [name] Primary Care Dalyah Pla: Oliver Barre Other Clinician: Referring Vallen Calabrese: Treating Treyshaun Keatts/Extender: Joeseph Amor in Treatment: 13 Wound Status Wound Number: 1 Primary Diabetic Wound/Ulcer of the Lower Extremity Etiology: Wound Location: Right, Medial, Plantar Foot Wound Open Wounding Event: Gradually Appeared Status: Date Acquired: 05/30/2022 Comorbid Cataracts, Arrhythmia, Congestive Heart Failure, Coronary Artery Weeks Of Treatment: 13 History: Disease,  Hypertension, Type II Diabetes, Neuropathy Clustered Wound: No Photos Wound Measurements Length: (cm) 0.6 Width: (cm) 0.8 Depth: (cm) 0.1 Area: (cm) 0.377 Volume: (cm) 0.038 % Reduction in Area: 63.1% % Reduction in Volume: 87.6% Epithelialization: Small (1-33%) Tunneling: No Undermining: No Wound Description Classification: Grade 2 Wound Margin: Thickened Exudate Amount: Medium Exudate Type: Serosanguineous Exudate Color: red, brown Foul Odor After Cleansing: No Slough/Fibrino Yes Wound Bed Granulation Amount: Large (67-100%) Exposed Structure Granulation Quality: Pink, Pale Fascia Exposed: No Necrotic Amount: Small (1-33%) Fat Layer (Subcutaneous Tissue) Exposed: Yes Necrotic Quality: Adherent Slough Tendon Exposed: No Muscle Exposed: No Joint Exposed: No Bone Exposed: No Periwound Skin Texture Texture Color No Abnormalities Noted: No No Abnormalities Noted: Yes Callus: Yes Temperature / Pain Temperature: No Abnormality Moisture No Abnormalities Noted: No Mcqueary, Quitman L ([id number]) [phone].pdf Page 7 of 8 Dry / Scaly: No Maceration: No Treatment Notes Wound #1 (Foot) Wound Laterality: Plantar, Right, Medial Cleanser Soap and Water Discharge Instruction: May shower and wash wound with dial antibacterial soap and water prior to dressing change. Vashe 5.8 (oz) Discharge Instruction: Or Cleanse the wound with Vashe prior to applying a clean dressing using gauze sponges, not tissue or cotton balls. Peri-Wound Care Zinc Oxide Ointment 30g tube Discharge Instruction: Apply Zinc Oxide to periwound with each dressing change Topical Gentamicin Discharge Instruction: As directed by physician Mupirocin Ointment Discharge Instruction: Apply Mupirocin (Bactroban) as instructed Primary Dressing Promogran Prisma Matrix, 4.34 (sq in) (silver collagen) Discharge Instruction: Moisten collagen with saline or hydrogel Secondary Dressing Optifoam  Non-Adhesive Dressing, 4x4 in Discharge Instruction: Apply over primary dressing cut to make foam donut Woven Gauze Sponges 2x2 in Discharge Instruction: Apply over primary dressing as directed. Secured With 33M Medipore H Soft Cloth Surgical T ape, 4 x 10 (in/yd) Discharge Instruction: Secure with tape as directed. Compression Wrap Compression  Stockings Facilities manager) Signed: 03/20/2023 5:16:53 PM By: Zenaida Deed RN, BSN Entered By: Zenaida Deed on 03/20/2023 13:14:54 -------------------------------------------------------------------------------- Vitals Details Patient Name: Date of Service: [name] MES L. 03/20/2023 1:00 PM Medical Record Number: [id number] Patient Account Number: [id number] Date of Birth/Sex: Treating RN: 1945-04-27 (78 y.o. [name] Primary Care Coreon Simkins: Oliver Barre Other Clinician: Referring Daishon Chui: Treating Chin Wachter/Extender: Joeseph Amor in Treatment: 13 Vital Signs Time Taken: 13:08 Temperature (F): 97.9 Height (in): 70 Pulse (bpm): 78 Weight (lbs): 186 Respiratory Rate (breaths/min): 18 Body Mass Index (BMI): 26.7 Blood Pressure (mmHg): 105/67 Reference Range: 80 - 120 mg / dl Notes Dhillon, Ashawn L ([id number]) [phone].pdf Page 8 of 8 pt did not check BS at home because he lost his meter Electronic Signature(s) Signed: 03/20/2023 5:16:53 PM By: Zenaida Deed RN, BSN Entered By: Zenaida Deed on 03/20/2023 13:09:49

## 2023-03-20 NOTE — Progress Notes (Signed)
ANGUS, HENDRIE ([id number]) [id number] Page 1 of 8 Visit Report for 03/20/2023 Chief Complaint Document Details Patient Name: Date of Service: [name], [name]. 03/20/2023 1:00 PM Medical Record Number: [id number] Patient Account Number: [id number] Date of Birth/Sex: Treating RN: [name] (78 y.o. M) Primary Care Provider: Oliver [name] Other Clinician: Referring Provider: Treating Provider/Extender: Joeseph Amor in Treatment: 13 Information Obtained from: Patient Chief Complaint Patients presents for treatment of an open diabetic ulcer Electronic Signature(s) Signed: 03/20/2023 1:29:32 PM By: Duanne Guess MD FACS Entered By: Duanne Guess on 03/20/2023 13:29:31 -------------------------------------------------------------------------------- Debridement Details Patient Name: Date of Service: [name]. 03/20/2023 1:00 PM Medical Record Number: [id number] Patient Account Number: [id number] Date of Birth/Sex: Treating RN: [name] (78 y.o. M) Primary Care Provider: Oliver [name] Other Clinician: Referring Provider: Treating Provider/Extender: Joeseph Amor in Treatment: 13 Debridement Performed for Assessment: Wound #1 Right,Medial,Plantar Foot Performed By: Physician Duanne Guess, MD The following information was scribed by: Zenaida Deed The information was scribed for: Duanne Guess Debridement Type: Debridement Severity of Tissue Pre Debridement: Fat layer exposed Level of Consciousness (Pre-procedure): Awake and Alert Pre-procedure Verification/Time Out Yes - 13:20 Taken: Start Time: 13:21 Pain Control: Lidocaine 4% T opical Solution Percent of Wound Bed Debrided: 150% T Area Debrided (cm): otal 1.13 Tissue and other material debrided: Viable, Non-Viable, Callus, Slough, Subcutaneous, Skin: Epidermis, Slough Level: Skin/Subcutaneous Tissue Debridement Description:  Excisional Instrument: Curette Bleeding: Minimum Hemostasis Achieved: Pressure Procedural Pain: 0 Post Procedural Pain: 0 Response to Treatment: Procedure was tolerated well Level of Consciousness (Post- Awake and Alert procedure): Post Debridement Measurements of Total Wound Length: (cm) 0.8 Width: (cm) 1.2 Depth: (cm) 0.2 Volume: (cm) 0.151 [name], [name] ([id number]) [phone].pdf Page 2 of 8 Character of Wound/Ulcer Post Debridement: Improved Severity of Tissue Post Debridement: Fat layer exposed Post Procedure Diagnosis Same as Pre-procedure Electronic Signature(s) Signed: 03/20/2023 1:29:26 PM By: Duanne Guess MD FACS Entered By: Duanne Guess on 03/20/2023 13:29:26 -------------------------------------------------------------------------------- HPI Details Patient Name: Date of Service: [name]. 03/20/2023 1:00 PM Medical Record Number: [id number] Patient Account Number: [id number] Date of Birth/Sex: Treating RN: [name] (77 y.o. M) Primary Care Provider: Oliver [name] Other Clinician: Referring Provider: Treating Provider/Extender: Joeseph Amor in Treatment: 13 History of Present Illness HPI Description: ADMISSION 12/16/2022 ***ABIs [name]: 0.95; R: 0.88*** This is a 78 year old type II diabetic (last hemoglobin A1c 7.2% on November 20, 2022) with a fairly extensive cardiac and peripheral vascular history. 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 Most recently, he has been followed by a podiatrist in [place] for right and left diabetic foot ulcers on the first metatarsal head. He says they were at first applying some kind of cream, the name of which he does not remember and have now been applying what he describes as a "some white stuff." Due to failure of the wounds to heal, he was referred to the wound care center for further evaluation and management. 12/29/2022: The  left foot wound is healed. The right first metatarsal head wound is surrounded with macerated callus and skin. There is some slough on the surface. 01/12/2023: The tissue maceration is much better this week. There is an odor coming from the wound, however. The wound is stable in size. 01/19/2023: The culture that I took last week grew out multiple species, including Enterococcus, strep, staph, and small amounts of Pseudomonas. I prescribed Augmentin and he is currently taking this. The wound itself  is looking a little bit better. Less maceration and a healthier appearing surface. The odor has abated. 01/24/2023: The wound is about the same size today, but the overall appearance is improved. There is no tissue maceration. The surface has a layer of slough overlying the granulation tissue. No malodor or purulent drainage. 12/5; the patient has a small wound on the right first metatarsal head. There is nothing open on the left side. This is a diabetic foot ulcer we have been using gentamicin mupirocin and silver alginate. 12/10; this is a small wound on the right first metatarsal head. Unfortunately not much different this week. 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 We have been using gent, mupirocin, silver alginate. 02/20/2023: No significant change in the wound dimensions. There is a fair amount of callus accumulation this week. His forefoot offloading shoe is pretty battered. 03/06/2023: There is a continued lack of progress in this wound. He continues to accumulate callus around the site despite wearing a forefoot offloading sandal. The wound dimensions are basically unchanged although the tissue appears quite healthy. 03/13/2023: There has been fairly substantial callus accumulation around the wound. There is a blue-green discoloration to the periwound tissue, as well. There is slough on the wound surface. No real change  to the wound dimensions. 03/20/2023: The culture that I took was positive for Proteus and Pseudomonas, among other organisms. I prescribed levofloxacin, which she is currently taking. There is a new area of tissue breakdown just medial to the existing wound, underneath the callus. His daughter accompanies him today and is encouraging him to proceed with total contact casting. Electronic Signature(s) Signed: 03/20/2023 1:30:48 PM By: Duanne Guess MD FACS Entered By: Duanne Guess on 03/20/2023 13:30:48 [name] ([id number]) [phone].pdf Page 3 of 8 -------------------------------------------------------------------------------- Physical Exam Details Patient Name: Date of Service: [name], [name]. 03/20/2023 1:00 PM Medical Record Number: [id number] Patient Account Number: [id number] Date of Birth/Sex: Treating RN: [name][birth year] (78 y.o. M) Primary Care Provider: Oliver [name] Other Clinician: Referring Provider: Treating Provider/Extender: Joeseph Amor in Treatment: 13 Constitutional . . . . no acute distress. Respiratory Normal work of breathing on room air.. Notes 03/20/2023: There is a new area of tissue breakdown just medial to the existing wound, underneath the callus. Electronic Signature(s) Signed: 03/20/2023 1:31:50 PM By: Duanne Guess MD FACS Entered By: Duanne Guess on 03/20/2023 13:31:50 -------------------------------------------------------------------------------- Physician Orders Details Patient Name: Date of Service: [name]. 03/20/2023 1:00 PM Medical Record Number: [id number] Patient Account Number: [id number] Date of Birth/Sex: Treating RN: [name] (78 y.o. Damaris Schooner Primary Care Provider: Oliver [name] Other Clinician: Referring Provider: Treating Provider/Extender: Joeseph Amor in Treatment: 13 The following information was scribed by: Zenaida Deed The  information was scribed for: Duanne Guess Verbal / Phone Orders: No Diagnosis Coding ICD-10 Coding Code Description [phone] Non-pressure chronic ulcer of other part of right foot with fat layer exposed E11.621 Type 2 diabetes mellitus with foot ulcer I73.9 Peripheral vascular disease, unspecified E11.49 Type 2 diabetes mellitus with other diabetic neurological complication Follow-up Appointments ppointment in 1 week. - Dr. Lady Gary Return A 03/22/23 @ 09:30 am for initial cast change Anesthetic (In clinic) Topical Lidocaine 5% applied to wound bed Bathing/ Shower/ Hygiene May shower and wash wound with soap and water. - remove dressing prior to shower Off-Loading Wound #1 Right,Medial,Plantar Foot Total Contact Cast to Right Lower Extremity  Wound Treatment [name], [name] ([id number]) [id number] Page 4 of 8 Wound #1 - Foot Wound Laterality: Plantar, Right, Medial Cleanser: Soap and Water 1 x Per Day/30 Days Discharge Instructions: May shower and wash wound with dial antibacterial soap and water prior to dressing change. Cleanser: Vashe 5.8 (oz) 1 x Per Day/30 Days Discharge Instructions: Or Cleanse the wound with Vashe prior to applying a clean dressing using gauze sponges, not tissue or cotton balls. Peri-Wound Care: Zinc Oxide Ointment 30g tube 1 x Per Day/30 Days Discharge Instructions: Apply Zinc Oxide to periwound with each dressing change Topical: Gentamicin 1 x Per Day/30 Days Discharge Instructions: As directed by physician Topical: Mupirocin Ointment 1 x Per Day/30 Days Discharge Instructions: Apply Mupirocin (Bactroban) as instructed Prim Dressing: Promogran Prisma Matrix, 4.34 (sq in) (silver collagen) 1 x Per Day/30 Days ary Discharge Instructions: Moisten collagen with saline or hydrogel Secondary Dressing: Optifoam Non-Adhesive Dressing, 4x4 in 1 x Per Day/30 Days Discharge Instructions: Apply over primary dressing cut to make foam  donut Secondary Dressing: Woven Gauze Sponges 2x2 in 1 x Per Day/30 Days Discharge Instructions: Apply over primary dressing as directed. Secured With: 70M Medipore H Soft Cloth Surgical T ape, 4 x 10 (in/yd) 1 x Per Day/30 Days Discharge Instructions: Secure with tape as directed. Electronic Signature(s) Signed: 03/20/2023 2:59:12 PM By: Duanne Guess MD FACS Signed: 03/20/2023 5:16:53 PM By: Zenaida Deed RN, BSN Entered By: Zenaida Deed on 03/20/2023 13:48:07 -------------------------------------------------------------------------------- Problem List Details Patient Name: Date of Service: [name]. 03/20/2023 1:00 PM Medical Record Number: [id number] Patient Account Number: [id number] Date of Birth/Sex: Treating RN: [birth year], [name] (78 y.o. Damaris Schooner Primary Care Provider: Oliver [name] Other Clinician: Referring Provider: Treating Provider/Extender: Joeseph Amor in Treatment: 13 Active Problems ICD-10 Encounter Code Description Active Date MDM Diagnosis L97.512 Non-pressure chronic ulcer of other part of right foot with fat layer exposed 12/16/2022 No Yes E11.621 Type 2 diabetes mellitus with foot ulcer 12/16/2022 No Yes I73.9 Peripheral vascular disease, unspecified 12/16/2022 No Yes E11.49 Type 2 diabetes mellitus with other diabetic neurological complication 12/16/2022 No Yes Inactive Problems [name], [name] ([id number]) [phone].pdf Page 5 of 8 ICD-10 Code Description Active Date Inactive Date L84 Corns and callosities 12/16/2022 12/16/2022 Resolved Problems ICD-10 Code Description Active Date Resolved Date L97.521 Non-pressure chronic ulcer of other part of left foot limited to breakdown of skin 12/16/2022 12/16/2022 Electronic Signature(s) Signed: 03/20/2023 1:29:05 PM By: Duanne Guess MD FACS Entered By: Duanne Guess on 03/20/2023  13:29:05 -------------------------------------------------------------------------------- Progress Note Details Patient Name: Date of Service: [name]. 03/20/2023 1:00 PM Medical Record Number: [id number] Patient Account Number: [id number] Date of Birth/Sex: Treating RN: [name], [name] (78 y.o. M) Primary Care Provider: Oliver [name] Other Clinician: Referring Provider: Treating Provider/Extender: Joeseph Amor in Treatment: 13 Subjective Chief Complaint Information obtained from Patient Patients presents for treatment of an open diabetic ulcer History of Present Illness (HPI) ADMISSION 12/16/2022 ***ABIs [name]: 0.95; R: 0.88*** This is a 78 year old type II diabetic (last hemoglobin A1c 7.2% on November 20, 2022) with a fairly extensive cardiac and peripheral vascular history. 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 Most recently, he has been followed by a podiatrist in [place] for right and left diabetic foot ulcers on the first metatarsal head. He says they were at first applying some kind of cream, the name of which he does not remember and have now been applying what he describes as a "some  white stuff." Due to failure of the wounds to heal, he was referred to the wound care center for further evaluation and management. 12/29/2022: The left foot wound is healed. The right first metatarsal head wound is surrounded with macerated callus and skin. There is some slough on the surface. 01/12/2023: The tissue maceration is much better this week. There is an odor coming from the wound, however. The wound is stable in size. 01/19/2023: The culture that I took last week grew out multiple species, including Enterococcus, strep, staph, and small amounts of Pseudomonas. I prescribed Augmentin and he is currently taking this. The wound itself is looking a little bit better. Less maceration and a healthier appearing surface. The odor  has abated. 01/24/2023: The wound is about the same size today, but the overall appearance is improved. There is no tissue maceration. The surface has a layer of slough overlying the granulation tissue. No malodor or purulent drainage. 12/5; the patient has a small wound on the right first metatarsal head. There is nothing open on the left side. This is a diabetic foot ulcer we have been using gentamicin mupirocin and silver alginate. 12/10; this is a small wound on the right first metatarsal head. Unfortunately not much different this week. 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 We have been using gent, mupirocin, silver alginate. 02/20/2023: No significant change in the wound dimensions. There is a fair amount of callus accumulation this week. His forefoot offloading shoe is pretty battered. 03/06/2023: There is a continued lack of progress in this wound. He continues to accumulate callus around the site despite wearing a forefoot offloading sandal. The wound dimensions are basically unchanged although the tissue appears quite healthy. 03/13/2023: There has been fairly substantial callus accumulation around the wound. There is a blue-green discoloration to the periwound tissue, as well. There is slough on the wound surface. No real change to the wound dimensions. 03/20/2023: The culture that I took was positive for Proteus and Pseudomonas, among other organisms. I prescribed levofloxacin, which she is currently taking. There is a new area of tissue breakdown just medial to the existing wound, underneath the callus. His daughter accompanies him today and is encouraging him to proceed with total contact casting. [name], [name] ([id number]) [id number] Page 6 of 8 Objective Constitutional no acute distress. Vitals Time Taken: 1:08 PM, Height: 70 in, Weight: 186 lbs, BMI: 26.7, Temperature: 97.9  F, Pulse: 78 bpm, Respiratory Rate: 18 breaths/min, Blood Pressure: 105/67 mmHg. General Notes: pt did not check BS at home because he lost his meter Respiratory Normal work of breathing on room air.. General Notes: 03/20/2023: There is a new area of tissue breakdown just medial to the existing wound, underneath the callus. Integumentary (Hair, Skin) Wound #1 status is Open. Original cause of wound was Gradually Appeared. The date acquired was: 05/30/2022. The wound has been in treatment 13 weeks. The wound is located on the Right,Medial,Plantar Foot. The wound measures 0.6cm length x 0.8cm width x 0.1cm depth; 0.377cm^2 area and 0.038cm^3 volume. There is Fat Layer (Subcutaneous Tissue) exposed. There is no tunneling or undermining noted. There is a medium amount of serosanguineous drainage noted. The wound margin is thickened. There is large (67-100%) pink, pale granulation within the wound bed. There is a small (1-33%) amount of necrotic tissue within the wound bed including Adherent Slough. The periwound skin appearance had no abnormalities noted for color. The  periwound skin appearance exhibited: Callus. The periwound skin appearance did not exhibit: Dry/Scaly, Maceration. Periwound temperature was noted as No Abnormality. Assessment Active Problems ICD-10 Non-pressure chronic ulcer of other part of right foot with fat layer exposed Type 2 diabetes mellitus with foot ulcer Peripheral vascular disease, unspecified Type 2 diabetes mellitus with other diabetic neurological complication Procedures Wound #1 Pre-procedure diagnosis of Wound #1 is a Diabetic Wound/Ulcer of the Lower Extremity located on the Right,Medial,Plantar Foot .Severity of Tissue Pre Debridement is: Fat layer exposed. There was a Excisional Skin/Subcutaneous Tissue Debridement with a total area of 1.13 sq cm performed by Duanne Guess, MD. With the following instrument(s): Curette to remove Viable and Non-Viable  tissue/material. Material removed includes Callus, Subcutaneous Tissue, Slough, and Skin: Epidermis after achieving pain control using Lidocaine 4% Topical Solution. No specimens were taken. A time out was conducted at 13:20, prior to the start of the procedure. A Minimum amount of bleeding was controlled with Pressure. The procedure was tolerated well with a pain level of 0 throughout and a pain level of 0 following the procedure. Post Debridement Measurements: 0.8cm length x 1.2cm width x 0.2cm depth; 0.151cm^3 volume. Character of Wound/Ulcer Post Debridement is improved. Severity of Tissue Post Debridement is: Fat layer exposed. Post procedure Diagnosis Wound #1: Same as Pre-Procedure Pre-procedure diagnosis of Wound #1 is a Diabetic Wound/Ulcer of the Lower Extremity located on the Right,Medial,Plantar Foot . There was a T Contact otal Cast Procedure by Duanne Guess, MD. Post procedure Diagnosis Wound #1: Same as Pre-Procedure Plan Follow-up Appointments: Return Appointment in 1 week. - Dr. Lady Gary 03/22/23 @ 09:30 am for cast change Anesthetic: (In clinic) Topical Lidocaine 5% applied to wound bed Bathing/ Shower/ Hygiene: May shower and wash wound with soap and water. - remove dressing prior to shower Off-Loading: Wound #1 Right,Medial,Plantar Foot: T Contact Cast to Right Lower Extremity otal WOUND #1: - Foot Wound Laterality: Plantar, Right, Medial Cleanser: Soap and Water 1 x Per Day/30 Days Discharge Instructions: May shower and wash wound with dial antibacterial soap and water prior to dressing change. [name], [name] ([id number]) [id number] Page 7 of 8 Cleanser: Vashe 5.8 (oz) 1 x Per Day/30 Days Discharge Instructions: Or Cleanse the wound with Vashe prior to applying a clean dressing using gauze sponges, not tissue or cotton balls. Peri-Wound Care: Zinc Oxide Ointment 30g tube 1 x Per Day/30 Days Discharge Instructions: Apply Zinc Oxide to  periwound with each dressing change Topical: Gentamicin 1 x Per Day/30 Days Discharge Instructions: As directed by physician Topical: Mupirocin Ointment 1 x Per Day/30 Days Discharge Instructions: Apply Mupirocin (Bactroban) as instructed Prim Dressing: Promogran Prisma Matrix, 4.34 (sq in) (silver collagen) 1 x Per Day/30 Days ary Discharge Instructions: Moisten collagen with saline or hydrogel Secondary Dressing: Optifoam Non-Adhesive Dressing, 4x4 in 1 x Per Day/30 Days Discharge Instructions: Apply over primary dressing cut to make foam donut Secondary Dressing: Woven Gauze Sponges 2x2 in 1 x Per Day/30 Days Discharge Instructions: Apply over primary dressing as directed. Secured With: 2M Medipore H Soft Cloth Surgical T ape, 4 x 10 (in/yd) 1 x Per Day/30 Days Discharge Instructions: Secure with tape as directed. 03/20/2023: The culture that I took was positive for Proteus and Pseudomonas, among other organisms. I prescribed levofloxacin, which he is currently taking. There is a new area of tissue breakdown just medial to the existing wound, underneath the callus. His daughter accompanies him today and is encouraging him to proceed with total contact casting. I used a  curette to debride callus, slough, and subcutaneous tissue from the wound. After further discussion with the patient's daughter and the patient, they are interested in proceeding with total contact casting today. We will continue to use topical gentamicin and mupirocin with Prisma silver collagen underneath the total contact cast. He will continue to take the oral levofloxacin as prescribed. Follow-up in 2 days for obligatory cast change. Electronic Signature(s) Signed: 03/20/2023 1:33:07 PM By: Duanne Guess MD FACS Entered By: Duanne Guess on 03/20/2023 13:33:07 -------------------------------------------------------------------------------- Total Contact Cast Details Patient Name: Date of Service: [name].  03/20/2023 1:00 PM Medical Record Number: [id number] Patient Account Number: [id number] Date of Birth/Sex: Treating RN: [name] (78 y.o. Damaris Schooner Primary Care Provider: Oliver [name] Other Clinician: Referring Provider: Treating Provider/Extender: Joeseph Amor in Treatment: 13 T Contact Cast Applied for Wound Assessment: otal Wound #1 Right,Medial,Plantar Foot Performed By: Physician Duanne Guess, MD The following information was scribed by: Zenaida Deed The information was scribed for: Duanne Guess Post Procedure Diagnosis Same as Pre-procedure Electronic Signature(s) Signed: 03/20/2023 2:59:12 PM By: Duanne Guess MD FACS Signed: 03/20/2023 5:16:53 PM By: Zenaida Deed RN, BSN Entered By: Zenaida Deed on 03/20/2023 13:23:52 -------------------------------------------------------------------------------- SuperBill Details Patient Name: Date of Service: [name]. 03/20/2023 Medical Record Number: [id number] Patient Account Number: [id number] Date of Birth/Sex: Treating RN: [birth year] (78 y.o. M) Primary Care Provider: Oliver [name] Other Clinician: Referring Provider: Treating Provider/Extender: Joeseph Amor in Treatment: [address] [name] ([id number]) [phone].pdf Page 8 of 8 Diagnosis Coding ICD-10 Codes Code Description [phone] Non-pressure chronic ulcer of other part of right foot with fat layer exposed E11.621 Type 2 diabetes mellitus with foot ulcer I73.9 Peripheral vascular disease, unspecified E11.49 Type 2 diabetes mellitus with other diabetic neurological complication Facility Procedures : CPT4 Code: [id number] Description: 11042 - DEB SUBQ TISSUE 20 SQ CM/< ICD-10 Diagnosis Description L97.512 Non-pressure chronic ulcer of other part of right foot with fat layer exposed Modifier: Quantity: 1 Physician Procedures : CPT4 Code Description Modifier [id number] 99214 - WC  PHYS LEVEL 4 - EST PT 25 ICD-10 Diagnosis Description L97.512 Non-pressure chronic ulcer of other part of right foot with fat layer exposed E11.621 Type 2 diabetes mellitus with foot ulcer I73.9  Peripheral vascular disease, unspecified 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

## 2023-03-21 ENCOUNTER — Encounter (HOSPITAL_COMMUNITY): Payer: Self-pay

## 2023-03-21 ENCOUNTER — Emergency Department (HOSPITAL_COMMUNITY): Payer: Medicare Other

## 2023-03-21 ENCOUNTER — Inpatient Hospital Stay (HOSPITAL_COMMUNITY)
Admission: EM | Admit: 2023-03-21 | Discharge: 2023-04-01 | DRG: 557 | Disposition: A | Discharge status: Skilled Nursing Facility | Payer: Medicare Other | Attending: Internal Medicine | Admitting: Internal Medicine

## 2023-03-21 ENCOUNTER — Other Ambulatory Visit: Payer: Self-pay

## 2023-03-21 DIAGNOSIS — R110 Nausea: Secondary | ICD-10-CM | POA: Diagnosis not present

## 2023-03-21 DIAGNOSIS — R778 Other specified abnormalities of plasma proteins: Secondary | ICD-10-CM | POA: Diagnosis not present

## 2023-03-21 DIAGNOSIS — F419 Anxiety disorder, unspecified: Secondary | ICD-10-CM | POA: Diagnosis present

## 2023-03-21 DIAGNOSIS — Z7984 Long term (current) use of oral hypoglycemic drugs: Secondary | ICD-10-CM

## 2023-03-21 DIAGNOSIS — T796XXD Traumatic ischemia of muscle, subsequent encounter: Secondary | ICD-10-CM | POA: Diagnosis not present

## 2023-03-21 DIAGNOSIS — R748 Abnormal levels of other serum enzymes: Secondary | ICD-10-CM | POA: Diagnosis not present

## 2023-03-21 DIAGNOSIS — Z882 Allergy status to sulfonamides status: Secondary | ICD-10-CM

## 2023-03-21 DIAGNOSIS — S0990XA Unspecified injury of head, initial encounter: Secondary | ICD-10-CM | POA: Diagnosis not present

## 2023-03-21 DIAGNOSIS — I517 Cardiomegaly: Secondary | ICD-10-CM | POA: Diagnosis not present

## 2023-03-21 DIAGNOSIS — E871 Hypo-osmolality and hyponatremia: Secondary | ICD-10-CM | POA: Diagnosis present

## 2023-03-21 DIAGNOSIS — Z95810 Presence of automatic (implantable) cardiac defibrillator: Secondary | ICD-10-CM

## 2023-03-21 DIAGNOSIS — I110 Hypertensive heart disease with heart failure: Secondary | ICD-10-CM | POA: Diagnosis not present

## 2023-03-21 DIAGNOSIS — I480 Paroxysmal atrial fibrillation: Secondary | ICD-10-CM | POA: Diagnosis present

## 2023-03-21 DIAGNOSIS — E860 Dehydration: Secondary | ICD-10-CM | POA: Diagnosis not present

## 2023-03-21 DIAGNOSIS — I4819 Other persistent atrial fibrillation: Secondary | ICD-10-CM

## 2023-03-21 DIAGNOSIS — J123 Human metapneumovirus pneumonia: Secondary | ICD-10-CM | POA: Diagnosis not present

## 2023-03-21 DIAGNOSIS — E11621 Type 2 diabetes mellitus with foot ulcer: Secondary | ICD-10-CM | POA: Diagnosis present

## 2023-03-21 DIAGNOSIS — F32A Depression, unspecified: Secondary | ICD-10-CM | POA: Diagnosis not present

## 2023-03-21 DIAGNOSIS — N179 Acute kidney failure, unspecified: Secondary | ICD-10-CM | POA: Diagnosis not present

## 2023-03-21 DIAGNOSIS — E11628 Type 2 diabetes mellitus with other skin complications: Secondary | ICD-10-CM | POA: Diagnosis present

## 2023-03-21 DIAGNOSIS — Z794 Long term (current) use of insulin: Secondary | ICD-10-CM

## 2023-03-21 DIAGNOSIS — I420 Dilated cardiomyopathy: Secondary | ICD-10-CM | POA: Diagnosis not present

## 2023-03-21 DIAGNOSIS — J9601 Acute respiratory failure with hypoxia: Secondary | ICD-10-CM | POA: Diagnosis not present

## 2023-03-21 DIAGNOSIS — M6282 Rhabdomyolysis: Principal | ICD-10-CM | POA: Diagnosis present

## 2023-03-21 DIAGNOSIS — Z7401 Bed confinement status: Secondary | ICD-10-CM | POA: Diagnosis not present

## 2023-03-21 DIAGNOSIS — T796XXA Traumatic ischemia of muscle, initial encounter: Secondary | ICD-10-CM

## 2023-03-21 DIAGNOSIS — Z83438 Family history of other disorder of lipoprotein metabolism and other lipidemia: Secondary | ICD-10-CM

## 2023-03-21 DIAGNOSIS — I484 Atypical atrial flutter: Secondary | ICD-10-CM

## 2023-03-21 DIAGNOSIS — E1165 Type 2 diabetes mellitus with hyperglycemia: Secondary | ICD-10-CM | POA: Diagnosis not present

## 2023-03-21 DIAGNOSIS — Z955 Presence of coronary angioplasty implant and graft: Secondary | ICD-10-CM

## 2023-03-21 DIAGNOSIS — R9431 Abnormal electrocardiogram [ECG] [EKG]: Secondary | ICD-10-CM | POA: Diagnosis not present

## 2023-03-21 DIAGNOSIS — T380X5A Adverse effect of glucocorticoids and synthetic analogues, initial encounter: Secondary | ICD-10-CM | POA: Diagnosis present

## 2023-03-21 DIAGNOSIS — Z7982 Long term (current) use of aspirin: Secondary | ICD-10-CM

## 2023-03-21 DIAGNOSIS — I252 Old myocardial infarction: Secondary | ICD-10-CM

## 2023-03-21 DIAGNOSIS — Z1152 Encounter for screening for COVID-19: Secondary | ICD-10-CM | POA: Diagnosis not present

## 2023-03-21 DIAGNOSIS — E875 Hyperkalemia: Secondary | ICD-10-CM | POA: Diagnosis not present

## 2023-03-21 DIAGNOSIS — R7989 Other specified abnormal findings of blood chemistry: Secondary | ICD-10-CM | POA: Diagnosis not present

## 2023-03-21 DIAGNOSIS — I255 Ischemic cardiomyopathy: Secondary | ICD-10-CM | POA: Diagnosis present

## 2023-03-21 DIAGNOSIS — Z89431 Acquired absence of right foot: Secondary | ICD-10-CM

## 2023-03-21 DIAGNOSIS — D696 Thrombocytopenia, unspecified: Secondary | ICD-10-CM | POA: Diagnosis not present

## 2023-03-21 DIAGNOSIS — R0602 Shortness of breath: Secondary | ICD-10-CM | POA: Diagnosis not present

## 2023-03-21 DIAGNOSIS — I447 Left bundle-branch block, unspecified: Secondary | ICD-10-CM | POA: Diagnosis present

## 2023-03-21 DIAGNOSIS — I951 Orthostatic hypotension: Secondary | ICD-10-CM | POA: Diagnosis present

## 2023-03-21 DIAGNOSIS — I700 Atherosclerosis of aorta: Secondary | ICD-10-CM | POA: Diagnosis not present

## 2023-03-21 DIAGNOSIS — E874 Mixed disorder of acid-base balance: Secondary | ICD-10-CM | POA: Diagnosis not present

## 2023-03-21 DIAGNOSIS — I2510 Atherosclerotic heart disease of native coronary artery without angina pectoris: Secondary | ICD-10-CM | POA: Diagnosis not present

## 2023-03-21 DIAGNOSIS — Z79899 Other long term (current) drug therapy: Secondary | ICD-10-CM

## 2023-03-21 DIAGNOSIS — Z7901 Long term (current) use of anticoagulants: Secondary | ICD-10-CM

## 2023-03-21 DIAGNOSIS — L97519 Non-pressure chronic ulcer of other part of right foot with unspecified severity: Secondary | ICD-10-CM | POA: Diagnosis present

## 2023-03-21 DIAGNOSIS — I4821 Permanent atrial fibrillation: Secondary | ICD-10-CM | POA: Diagnosis not present

## 2023-03-21 DIAGNOSIS — B964 Proteus (mirabilis) (morganii) as the cause of diseases classified elsewhere: Secondary | ICD-10-CM | POA: Diagnosis present

## 2023-03-21 DIAGNOSIS — R296 Repeated falls: Secondary | ICD-10-CM | POA: Diagnosis present

## 2023-03-21 DIAGNOSIS — R4182 Altered mental status, unspecified: Secondary | ICD-10-CM | POA: Diagnosis not present

## 2023-03-21 DIAGNOSIS — M19071 Primary osteoarthritis, right ankle and foot: Secondary | ICD-10-CM | POA: Diagnosis not present

## 2023-03-21 DIAGNOSIS — Z803 Family history of malignant neoplasm of breast: Secondary | ICD-10-CM

## 2023-03-21 DIAGNOSIS — L089 Local infection of the skin and subcutaneous tissue, unspecified: Secondary | ICD-10-CM | POA: Diagnosis not present

## 2023-03-21 DIAGNOSIS — Z87442 Personal history of urinary calculi: Secondary | ICD-10-CM

## 2023-03-21 DIAGNOSIS — Z833 Family history of diabetes mellitus: Secondary | ICD-10-CM

## 2023-03-21 DIAGNOSIS — Z888 Allergy status to other drugs, medicaments and biological substances status: Secondary | ICD-10-CM

## 2023-03-21 DIAGNOSIS — Z8674 Personal history of sudden cardiac arrest: Secondary | ICD-10-CM

## 2023-03-21 DIAGNOSIS — I5043 Acute on chronic combined systolic (congestive) and diastolic (congestive) heart failure: Secondary | ICD-10-CM

## 2023-03-21 DIAGNOSIS — E1142 Type 2 diabetes mellitus with diabetic polyneuropathy: Secondary | ICD-10-CM | POA: Diagnosis present

## 2023-03-21 DIAGNOSIS — I5042 Chronic combined systolic (congestive) and diastolic (congestive) heart failure: Secondary | ICD-10-CM | POA: Diagnosis not present

## 2023-03-21 DIAGNOSIS — I959 Hypotension, unspecified: Secondary | ICD-10-CM | POA: Diagnosis not present

## 2023-03-21 DIAGNOSIS — Z9181 History of falling: Secondary | ICD-10-CM

## 2023-03-21 DIAGNOSIS — M7731 Calcaneal spur, right foot: Secondary | ICD-10-CM | POA: Diagnosis not present

## 2023-03-21 DIAGNOSIS — H9190 Unspecified hearing loss, unspecified ear: Secondary | ICD-10-CM | POA: Diagnosis present

## 2023-03-21 DIAGNOSIS — I10 Essential (primary) hypertension: Secondary | ICD-10-CM | POA: Diagnosis not present

## 2023-03-21 DIAGNOSIS — E7800 Pure hypercholesterolemia, unspecified: Secondary | ICD-10-CM | POA: Diagnosis present

## 2023-03-21 DIAGNOSIS — J811 Chronic pulmonary edema: Secondary | ICD-10-CM | POA: Diagnosis not present

## 2023-03-21 DIAGNOSIS — Z5181 Encounter for therapeutic drug level monitoring: Secondary | ICD-10-CM | POA: Diagnosis not present

## 2023-03-21 LAB — URINALYSIS, W/ REFLEX TO CULTURE (INFECTION SUSPECTED)
Bacteria, UA: NONE SEEN
Bilirubin Urine: NEGATIVE
Glucose, UA: >=500 mg/dL — AB
Hgb urine dipstick: NEGATIVE
Ketones, ur: NEGATIVE mg/dL
Leukocytes,Ua: NEGATIVE
Nitrite: NEGATIVE
Protein, ur: NEGATIVE mg/dL
Specific Gravity, Urine: 1.028 (ref 1.005–1.030)
pH: 6 (ref 5.0–8.0)

## 2023-03-21 LAB — PROTIME-INR
INR: 2 — ABNORMAL HIGH (ref 0.8–1.2)
Prothrombin Time: 22.7 s — ABNORMAL HIGH (ref 11.4–15.2)

## 2023-03-21 LAB — CBC WITH DIFFERENTIAL/PLATELET
Abs Immature Granulocytes: 0.01 10*3/uL (ref 0.00–0.07)
Basophils Absolute: 0 10*3/uL (ref 0.0–0.1)
Basophils Relative: 0 %
Eosinophils Absolute: 0 10*3/uL (ref 0.0–0.5)
Eosinophils Relative: 0 %
HCT: 43.7 % (ref 39.0–52.0)
Hemoglobin: 14.4 g/dL (ref 13.0–17.0)
Immature Granulocytes: 0 %
Lymphocytes Relative: 11 %
Lymphs Abs: 0.8 10*3/uL (ref 0.7–4.0)
MCH: 29.4 pg (ref 26.0–34.0)
MCHC: 33 g/dL (ref 30.0–36.0)
MCV: 89.2 fL (ref 80.0–100.0)
Monocytes Absolute: 1.3 10*3/uL — ABNORMAL HIGH (ref 0.1–1.0)
Monocytes Relative: 18 %
Neutro Abs: 5 10*3/uL (ref 1.7–7.7)
Neutrophils Relative %: 71 %
Platelets: 140 10*3/uL — ABNORMAL LOW (ref 150–400)
RBC: 4.9 MIL/uL (ref 4.22–5.81)
RDW: 14.2 % (ref 11.5–15.5)
WBC: 7.2 10*3/uL (ref 4.0–10.5)
nRBC: 0 % (ref 0.0–0.2)

## 2023-03-21 LAB — COMPREHENSIVE METABOLIC PANEL
ALT: 17 U/L (ref 0–44)
AST: 43 U/L — ABNORMAL HIGH (ref 15–41)
Albumin: 3.7 g/dL (ref 3.5–5.0)
Alkaline Phosphatase: 62 U/L (ref 38–126)
Anion gap: 12 (ref 5–15)
BUN: 28 mg/dL — ABNORMAL HIGH (ref 8–23)
CO2: 23 mmol/L (ref 22–32)
Calcium: 9.5 mg/dL (ref 8.9–10.3)
Chloride: 99 mmol/L (ref 98–111)
Creatinine, Ser: 1.25 mg/dL — ABNORMAL HIGH (ref 0.61–1.24)
GFR, Estimated: >60 mL/min (ref >60)
Glucose, Bld: 148 mg/dL — ABNORMAL HIGH (ref 70–99)
Potassium: 4.1 mmol/L (ref 3.5–5.1)
Sodium: 134 mmol/L — ABNORMAL LOW (ref 135–145)
Total Bilirubin: 0.8 mg/dL (ref 0.0–1.2)
Total Protein: 7.2 g/dL (ref 6.5–8.1)

## 2023-03-21 LAB — GLUCOSE, CAPILLARY
Glucose-Capillary: 93 mg/dL (ref 70–99)
Glucose-Capillary: 141 mg/dL — ABNORMAL HIGH (ref 70–99)

## 2023-03-21 LAB — I-STAT CG4 LACTIC ACID, ED: Lactic Acid, Venous: 2.1 mmol/L (ref 0.5–1.9)

## 2023-03-21 LAB — CK: Total CK: 1750 U/L — ABNORMAL HIGH (ref 49–397)

## 2023-03-21 LAB — APTT: aPTT: 40 s — ABNORMAL HIGH (ref 24–36)

## 2023-03-21 MED ORDER — DOFETILIDE 250 MCG PO CAPS: 500.0000 ug | ORAL_CAPSULE | Freq: Two times a day (BID) | ORAL | Status: DC

## 2023-03-21 MED ORDER — SODIUM CHLORIDE 0.9 % IV SOLN
2.0000 g | Freq: Two times a day (BID) | INTRAVENOUS | Status: AC
  Administered 2023-03-22 – 2023-03-24 (×6): 2 g via INTRAVENOUS
  Filled 2023-03-21 (×6): qty 12.5

## 2023-03-21 MED ORDER — ACETAMINOPHEN 325 MG PO TABS
650.0000 mg | ORAL_TABLET | Freq: Four times a day (QID) | ORAL | Status: DC | PRN
  Administered 2023-03-22 – 2023-03-23 (×2): 650 mg via ORAL
  Filled 2023-03-21 (×2): qty 2

## 2023-03-21 MED ORDER — SODIUM CHLORIDE 0.9 % IV SOLN
2.0000 g | Freq: Once | INTRAVENOUS | Status: AC
  Administered 2023-03-21: 2 g via INTRAVENOUS
  Filled 2023-03-21: qty 12.5

## 2023-03-21 MED ORDER — INSULIN ASPART 100 UNIT/ML IJ SOLN
0.0000 [IU] | Freq: Three times a day (TID) | INTRAMUSCULAR | Status: DC
Start: 2023-03-22 — End: 2023-04-01
  Administered 2023-03-22 (×2): 2 [IU] via SUBCUTANEOUS
  Administered 2023-03-22 – 2023-03-23 (×2): 3 [IU] via SUBCUTANEOUS
  Administered 2023-03-23: 2 [IU] via SUBCUTANEOUS
  Administered 2023-03-24: 3 [IU] via SUBCUTANEOUS
  Administered 2023-03-24 (×2): 5 [IU] via SUBCUTANEOUS
  Administered 2023-03-25: 3 [IU] via SUBCUTANEOUS
  Administered 2023-03-25: 5 [IU] via SUBCUTANEOUS
  Administered 2023-03-25: 8 [IU] via SUBCUTANEOUS
  Administered 2023-03-26: 11 [IU] via SUBCUTANEOUS
  Administered 2023-03-26: 7 [IU] via SUBCUTANEOUS
  Administered 2023-03-26 – 2023-03-27 (×2): 3 [IU] via SUBCUTANEOUS
  Administered 2023-03-27: 5 [IU] via SUBCUTANEOUS
  Administered 2023-03-27 – 2023-03-28 (×2): 3 [IU] via SUBCUTANEOUS
  Administered 2023-03-28: 8 [IU] via SUBCUTANEOUS
  Administered 2023-03-28: 9 [IU] via SUBCUTANEOUS
  Administered 2023-03-29 – 2023-03-30 (×3): 5 [IU] via SUBCUTANEOUS
  Administered 2023-03-30: 3 [IU] via SUBCUTANEOUS
  Administered 2023-03-31: 2 [IU] via SUBCUTANEOUS
  Administered 2023-03-31 (×2): 3 [IU] via SUBCUTANEOUS
  Administered 2023-04-01: 5 [IU] via SUBCUTANEOUS
  Filled 2023-03-21: qty 0.15

## 2023-03-21 MED ORDER — SODIUM CHLORIDE 0.9 % IV SOLN: INTRAVENOUS | Status: AC

## 2023-03-21 MED ORDER — FLUTICASONE PROPIONATE 50 MCG/ACT NA SUSP
2.0000 | Freq: Every day | NASAL | Status: DC
  Administered 2023-03-22 – 2023-04-01 (×11): 2 via NASAL
  Filled 2023-03-21: qty 16

## 2023-03-21 MED ORDER — SODIUM CHLORIDE 0.9 % IV BOLUS
500.0000 mL | Freq: Once | INTRAVENOUS | Status: AC
  Administered 2023-03-21: 500 mL via INTRAVENOUS

## 2023-03-21 MED ORDER — ZOLPIDEM TARTRATE 5 MG PO TABS
5.0000 mg | ORAL_TABLET | Freq: Every evening | ORAL | Status: DC | PRN
  Administered 2023-03-21: 5 mg via ORAL
  Filled 2023-03-21: qty 1

## 2023-03-21 MED ORDER — EMPAGLIFLOZIN 25 MG PO TABS
25.0000 mg | ORAL_TABLET | Freq: Every day | ORAL | Status: DC
  Administered 2023-03-22 – 2023-04-01 (×11): 25 mg via ORAL
  Filled 2023-03-21 (×12): qty 1

## 2023-03-21 MED ORDER — LORAZEPAM 0.5 MG PO TABS
0.5000 mg | ORAL_TABLET | Freq: Two times a day (BID) | ORAL | Status: DC | PRN
  Administered 2023-03-22 – 2023-03-23 (×2): 0.5 mg via ORAL
  Filled 2023-03-21 (×3): qty 1

## 2023-03-21 MED ORDER — INSULIN ASPART 100 UNIT/ML IJ SOLN
0.0000 [IU] | Freq: Every day | INTRAMUSCULAR | Status: DC
  Administered 2023-03-24: 2 [IU] via SUBCUTANEOUS
  Administered 2023-03-25: 3 [IU] via SUBCUTANEOUS
  Administered 2023-03-26: 2 [IU] via SUBCUTANEOUS
  Administered 2023-03-27: 3 [IU] via SUBCUTANEOUS
  Filled 2023-03-21: qty 0.05

## 2023-03-21 MED ORDER — LORATADINE 10 MG PO TABS
10.0000 mg | ORAL_TABLET | Freq: Every day | ORAL | Status: DC
  Administered 2023-03-22 – 2023-03-28 (×7): 10 mg via ORAL
  Filled 2023-03-21 (×7): qty 1

## 2023-03-21 MED ORDER — METOPROLOL SUCCINATE ER 25 MG PO TB24
200.0000 mg | ORAL_TABLET | Freq: Every day | ORAL | Status: DC
  Administered 2023-03-22 – 2023-03-31 (×10): 200 mg via ORAL
  Filled 2023-03-21: qty 8
  Filled 2023-03-21 (×4): qty 2
  Filled 2023-03-21 (×2): qty 8
  Filled 2023-03-21: qty 4
  Filled 2023-03-21: qty 2
  Filled 2023-03-21: qty 8
  Filled 2023-03-21 (×2): qty 4

## 2023-03-21 MED ORDER — FLUOXETINE HCL 10 MG PO CAPS: 10.0000 mg | ORAL_CAPSULE | Freq: Every day | ORAL | Status: DC

## 2023-03-21 MED ORDER — LORAZEPAM 0.5 MG PO TABS
0.5000 mg | ORAL_TABLET | Freq: Every day | ORAL | Status: DC
Start: 2023-03-21 — End: 2023-04-01
  Administered 2023-03-21 – 2023-03-31 (×11): 0.5 mg via ORAL
  Filled 2023-03-21 (×10): qty 1

## 2023-03-21 MED ORDER — ACETAMINOPHEN 650 MG RE SUPP: 650.0000 mg | Freq: Four times a day (QID) | RECTAL | Status: DC | PRN

## 2023-03-21 MED ORDER — ROSUVASTATIN CALCIUM 20 MG PO TABS: 20.0000 mg | ORAL_TABLET | Freq: Every day | ORAL | Status: DC

## 2023-03-21 MED ORDER — ASPIRIN 81 MG PO TBEC
81.0000 mg | DELAYED_RELEASE_TABLET | Freq: Every day | ORAL | Status: DC
  Administered 2023-03-22 – 2023-04-01 (×11): 81 mg via ORAL
  Filled 2023-03-21 (×11): qty 1

## 2023-03-21 MED ORDER — RIVAROXABAN 20 MG PO TABS
20.0000 mg | ORAL_TABLET | Freq: Every day | ORAL | Status: DC
  Administered 2023-03-21 – 2023-03-31 (×11): 20 mg via ORAL
  Filled 2023-03-21 (×11): qty 1

## 2023-03-21 MED ORDER — INSULIN GLARGINE-YFGN 100 UNIT/ML ~~LOC~~ SOLN
14.0000 [IU] | Freq: Every day | SUBCUTANEOUS | Status: DC
  Administered 2023-03-22 – 2023-04-01 (×11): 14 [IU] via SUBCUTANEOUS
  Filled 2023-03-21 (×11): qty 0.14

## 2023-03-21 MED ORDER — ONDANSETRON HCL 4 MG PO TABS
4.0000 mg | ORAL_TABLET | Freq: Four times a day (QID) | ORAL | Status: DC | PRN
Start: 2023-03-21 — End: 2023-03-23

## 2023-03-21 MED ORDER — ONDANSETRON HCL 4 MG/2ML IJ SOLN
4.0000 mg | Freq: Four times a day (QID) | INTRAMUSCULAR | Status: DC | PRN
  Administered 2023-03-23: 4 mg via INTRAVENOUS
  Filled 2023-03-21: qty 2

## 2023-03-21 MED ORDER — CITALOPRAM HYDROBROMIDE 10 MG PO TABS: 40.0000 mg | ORAL_TABLET | Freq: Every day | ORAL | Status: DC

## 2023-03-21 NOTE — Progress Notes (Signed)
Pharmacy Antibiotic Note  [name] is a 78 y.o. male admitted on 03/21/2023 with  foot wound .  Pharmacy has been consulted for cefepime dosing.  Today, 03/21/23 WBC WNL CK 1750, Lactate 2.1  Plan: Cefepime 2 g IV q12h Monitor renal function, culture data  Height: 5\' 10"  (177.8 cm) Weight: 87 kg (191 lb 12.8 oz) IBW/kg (Calculated) : 73  Temp (24hrs), Avg:98.4 F (36.9 C), Min:98 F (36.7 C), Max:99.1 F (37.3 C)  Recent Labs  Lab 03/21/23 1405 03/21/23 1422  WBC 7.2  --   CREATININE 1.25*  --   LATICACIDVEN  --  2.1*    Estimated Creatinine Clearance: 52.7 mL/min (A) (by C-G formula based on SCr of 1.25 mg/dL (H)).    Allergies  Allergen Reactions   Salmon [Fish Allergy] Other (See Comments)    Only canned/ not fresh (probably preservatives)   Sulfa Antibiotics Other (See Comments)    Not sure he has an allergy to this (??)   Metformin And Related Diarrhea    Antimicrobials this admission: cefepime 1/21 >>   Dose adjustments this admission:  Microbiology results: 1/21 BCx: ngtd  Cindi Carbon, PharmD 03/21/2023 6:05 PM

## 2023-03-21 NOTE — H&P (Signed)
History and Physical  REGINO SERIE XBM:[id number] DOB: [birth year] DOA: 03/21/2023  PCP: Corwin Levins, MD   Chief Complaint: Fall at home  HPI: [name] is a 78 y.o. male with medical history significant for insulin-dependent type 2 diabetes, diabetic peripheral neuropathy, hyperlipidemia, persistent atrial fibrillation on anticoagulation, ischemic cardiomyopathy EF 20% currently being treated for right lower extremity diabetic wound and is being admitted to the hospital with rhabdomyolysis after a fall at home today.  History is provided by the patient's daughter who is at the bedside, she takes him to most of his medical appointments.  He has been followed by Dr. Lady Gary for a right lower extremity diabetic wound, recent outpatient cultures grew Proteus and he has been placed on oral Levaquin.  He was just seen in wound care office yesterday and had a boot placed.  He tells me that he has been feeling sort of dizzy and lightheaded the last few days, yesterday after he came home from his appointment he got lightheaded and fell, hitting the side of his head on his coffee table.  He was unable to get up off the ground for several hours.  He takes several heart failure medications at home, his daughter states that he sometimes forgets what medications he is taking and ends up taking extra doses, etc.  They mention that he vomited a couple of times yesterday, but denies any chest pain, fevers, diarrhea or other concerns.  Most recent blood pressure in the ER 70s over 40s, he feels weak but denies dizziness or lightheadedness.  Workup in the emergency department is actually quite benign, though he is significantly hypotensive, and has elevated CK.  He was given small bolus of 500 cc, and hospitalist admission was requested.  Review of Systems: Please see HPI for pertinent positives and negatives. A complete 10 system review of systems are otherwise negative.  Past Medical History:  Diagnosis Date    CALLUS, RIGHT FOOT 12/08/2009   Cardiac arrest [facility])    Chronic systolic heart failure (HCC) 11/03/2009   DIABETES MELLITUS, TYPE II 12/05/2006   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/2009   HYPERLIPIDEMIA 11/25/2006   HYPOGONADISM, MALE 12/05/2006   Implantable cardiac defibrillator MDT 09/22/2008   Change out feb 2011, 2015   Ischemic cardiomyopathy 09/22/2008   DES CX and RCA  70% residual LAD  Done in [place]; EF 30%;; myoviewq 2006/04/12 no ischemia   Kidney stones    Long term (current) use of anticoagulants 04/25/2010   Myocardial infarction [facility]) 2004/04/12   "I died and they brought me back"   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   NEPHROLITHIASIS, HX OF 02/25/2008   Persistent atrial fibrillation (HCC) 11/25/2006   Inapp shock Apr 12, 2010 AF VR >220 recurrent   Past Surgical History:  Procedure Laterality Date   CARDIAC CATHETERIZATION     feburary April 12, 2012   CARDIAC DEFIBRILLATOR PLACEMENT     Guidant Vitality [name]   CARDIOVERSION N/A 04/25/2012   Procedure: CARDIOVERSION;  Surgeon: Cassell Clement, MD;  Location: [facility] ENDOSCOPY;  Service: Cardiovascular;  Laterality: N/A;   CARDIOVERSION N/A 05/21/2012   Procedure: CARDIOVERSION;  Surgeon: Duke Salvia, MD;  Location: [facility] ENDOSCOPY;  Service: Cardiovascular;  Laterality: N/A;   CATARACT EXTRACTION     EYE SURGERY     cataracts   IMPLANTABLE CARDIOVERTER DEFIBRILLATOR (ICD) GENERATOR CHANGE N/A 02/05/2014   Procedure: ICD GENERATOR CHANGE;  Surgeon: Duke Salvia, MD;  Location: [facility] CATH LAB;  Service: Cardiovascular;  Laterality: N/A;  PTCA     TONSILLECTOMY     TONSILLECTOMY     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 OR;  Service: Vascular;  Laterality: Right;   Social History:  reports that he has never smoked. He has never used smokeless tobacco. He reports current alcohol use. He reports that he does not use drugs.  Allergies   Allergen Reactions   Salmon [Fish Allergy] Other (See Comments)    Only canned/ not fresh (probably preservatives)   Sulfa Antibiotics Other (See Comments)    Not sure he has an allergy to this (??)   Metformin And Related Diarrhea    Family History  Problem Relation Age of Onset   Diabetes Maternal Grandmother    Hyperlipidemia Maternal Grandmother    Breast cancer Mother    Cancer Neg Hx    COPD Neg Hx    Heart disease Neg Hx      Prior to Admission medications   Medication Sig Start Date End Date Taking? Authorizing Provider  aspirin EC 81 MG tablet Take 81 mg by mouth daily. Swallow whole.   Yes [provider]  Cholecalciferol (VITAMIN D3 PO) Take 1 capsule by mouth daily.   Yes [provider]  citalopram (CELEXA) 40 MG tablet Take 40 mg by mouth in the morning.   Yes [provider]  Continuous Glucose Receiver (FREESTYLE LIBRE 2 READER) DEVI Use as instructure to check blood sugar 07/21/22  Yes Carlus Pavlov, MD  Continuous Glucose Sensor (FREESTYLE LIBRE 2 SENSOR) MISC Apply new sensor every 14 days Patient taking differently: Inject 1 Device into the skin every 14 (fourteen) days. 09/05/22  Yes Carlus Pavlov, MD  Cyanocobalamin (VITAMIN B12 PO) Take 1 tablet by mouth daily.   Yes [provider]  dofetilide (TIKOSYN) 500 MCG capsule TAKE 1 CAPSULE BY MOUTH 2 TIMES DAILY Patient taking differently: Take 500 mcg by mouth 2 (two) times daily. 01/02/17  Yes Duke Salvia, MD  empagliflozin (JARDIANCE) 25 MG TABS tablet Take 25 mg by mouth daily.   Yes [provider]  eplerenone (INSPRA) 50 MG tablet Take 50 mg by mouth in the morning.   Yes [provider]  FLUoxetine (PROZAC) 10 MG capsule Take 1 capsule (10 mg total) by mouth daily. 08/24/22  Yes Corwin Levins, MD  fluticasone (FLONASE) 50 MCG/ACT nasal spray USE 2 SPRAYS IN EACH NOSTRIL EVERY DAY Patient taking differently: Place 2 sprays into both nostrils daily  as needed for allergies or rhinitis. 01/15/18  Yes Corwin Levins, MD  insulin lispro (HUMALOG KWIKPEN) 100 UNIT/ML KwikPen INJECT 10-14 units under skin before meals, 3 times a day, as advised Patient taking differently: Inject 11 Units into the skin 3 (three) times daily before meals. 03/06/23  Yes Carlus Pavlov, MD  LANTUS SOLOSTAR 100 UNIT/ML Solostar Pen INJECT 14 UNITS UNDER THE SKIN DAILY Patient taking differently: Inject 14 Units into the skin daily before breakfast. 01/30/23  Yes Carlus Pavlov, MD  levofloxacin (LEVAQUIN) 750 MG tablet Take 750 mg by mouth daily. 03/15/23 03/25/23 Yes [provider]  liraglutide (VICTOZA) 18 MG/3ML SOPN Inject 1.8 mg into the skin in the morning.   Yes [provider]  LORazepam (ATIVAN) 0.5 MG tablet Take 1 tablet (0.5 mg total) by mouth at bedtime. 08/24/22  Yes Corwin Levins, MD  Magnesium Oxide (MAG-OX 400 PO) Take 400 mg by mouth 2 (two) times daily.  Yes [provider]  metoprolol (TOPROL-XL) 200 MG 24 hr tablet Take 200 mg by mouth daily after supper. 12/31/19  Yes [provider]  Multiple Vitamins-Minerals (CENTRUM SILVER 50+MEN) TABS Take 1 tablet by mouth daily with breakfast.   Yes [provider]  NITROSTAT 0.4 MG SL tablet Place 1 tablet (0.4 mg total) under the tongue every 5 (five) minutes as needed for chest pain. 10/06/16  Yes Duke Salvia, MD  Pyridoxine HCl (VITAMIN B6 PO) Take 1 tablet by mouth daily.   Yes [provider]  rosuvastatin (CRESTOR) 20 MG tablet TAKE 1 TABLET BY MOUTH EVERY DAY Patient taking differently: Take 20 mg by mouth at bedtime. 08/30/18  Yes Corwin Levins, MD  sacubitril-valsartan (ENTRESTO) 24-26 MG Take 1 tablet by mouth 2 (two) times daily.   Yes [provider]  torsemide (DEMADEX) 10 MG tablet Take 10 mg by mouth every other day.   Yes [provider]  XARELTO 20 MG TABS tablet TAKE 1 TABLET BY MOUTH EVERY DAY Patient taking  differently: Take 20 mg by mouth daily after supper. 04/24/17  Yes Duke Salvia, MD  zolpidem (AMBIEN) 5 MG tablet TAKE 1 OR 2 TABLETS BY MOUTH NIGHTLY AT BEDTIME AS NEEDED Patient taking differently: Take 5 mg by mouth at bedtime. 12/25/18  Yes Corwin Levins, MD  Glucose Blood (FREESTYLE PRECISION NEO TEST VI) by In Vitro route.    [provider]  glucose blood (ONE TOUCH ULTRA TEST) test strip 1 each by Other route 2 (two) times daily. 12/20/16   Corwin Levins, MD  Lancets MISC Use as directed up to 4 times per day 02/07/14   Corwin Levins, MD  LORazepam (ATIVAN) 1 MG tablet TAKE 1/2 TABLET BY MOUTH EVERY DAY AS NEEDED FOR ANXIETY Patient not taking: Reported on 03/21/2023 07/19/17   Corwin Levins, MD  Stann Ore MICRO PEN NEEDLES 32G X 4 MM MISC USE DAILY AS DIRECTED 07/09/18   Romero Belling, MD    Physical Exam: BP 102/65   Pulse 75   Temp 98 F (36.7 C)   Resp 19   Ht 5\' 10"  (1.778 m)   Wt 87 kg   SpO2 96%   BMI 27.52 kg/m  General:  Alert, oriented, calm, in no acute distress, hard of hearing.  His ex-wife and daughter are at the bedside.  He is lying flat in bed without any respiratory distress.  Overall he looks dry. Eyes: EOMI, clear conjuctivae, white sclerea Neck: supple, no masses, trachea mildline  Cardiovascular: RRR, no murmurs or rubs, no peripheral edema  Respiratory: clear to auscultation bilaterally, no wheezes, no crackles  Abdomen: soft, nontender, nondistended, normal bowel tones heard  Skin: Very dry, no rashes  Musculoskeletal: no joint effusions, normal range of motion, he has a large hard cast on his right lower extremity Psychiatric: appropriate affect, normal speech  Neurologic: extraocular muscles intact, clear speech, moving all extremities with intact sensorium         Labs on Admission:  Basic Metabolic Panel: Recent Labs  Lab 03/21/23 1405  NA 134*  K 4.1  CL 99  CO2 23  GLUCOSE 148*  BUN 28*  CREATININE 1.25*  CALCIUM 9.5    Liver Function Tests: Recent Labs  Lab 03/21/23 1405  AST 43*  ALT 17  ALKPHOS 62  BILITOT 0.8  PROT 7.2  ALBUMIN 3.7   No results for input(s): "LIPASE", "AMYLASE" in the last 168 hours. No  results for input(s): "AMMONIA" in the last 168 hours. CBC: Recent Labs  Lab 03/21/23 1405  WBC 7.2  NEUTROABS 5.0  HGB 14.4  HCT 43.7  MCV 89.2  PLT 140*   Cardiac Enzymes: Recent Labs  Lab 03/21/23 1405  CKTOTAL 1,750*   BNP (last 3 results) No results for input(s): "BNP" in the last 8760 hours.  ProBNP (last 3 results) No results for input(s): "PROBNP" in the last 8760 hours.  CBG: No results for input(s): "GLUCAP" in the last 168 hours.  Radiological Exams on Admission: CT Head Wo Contrast Result Date: 03/21/2023 CLINICAL DATA:  Head trauma. EXAM: CT HEAD WITHOUT CONTRAST TECHNIQUE: Contiguous axial images were obtained from the base of the skull through the vertex without intravenous contrast. RADIATION DOSE REDUCTION: This exam was performed according to the departmental dose-optimization program which includes automated exposure control, adjustment of the mA and/or kV according to patient size and/or use of iterative reconstruction technique. COMPARISON:  Head CT dated 03/19/2017. FINDINGS: Brain: The ventricles and sulci are appropriate size for the patient's age. The gray-white matter discrimination is preserved. There is no acute intracranial hemorrhage. No mass effect or midline shift. No extra-axial fluid collection. Vascular: No hyperdense vessel or unexpected calcification. Skull: Normal. Negative for fracture or focal lesion. Sinuses/Orbits: No acute finding. Other: None IMPRESSION: No acute intracranial pathology. Electronically Signed   By: Elgie Collard M.D.   On: 03/21/2023 16:41   DG Foot Complete Right Result Date: 03/21/2023 CLINICAL DATA:  Questionable sepsis-evaluate for abnormality. History of foot infection with recent antibiotics. EXAM: RIGHT FOOT  COMPLETE - 3+ VIEW COMPARISON:  None Available. 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 No evidence of acute fracture, dislocation or recurrent osteomyelitis. Mild midfoot degenerative changes. There are bidirectional calcaneal spurs. Soft tissue evaluation limited by overlying bandages. No foreign body or definite soft tissue emphysema identified. IMPRESSION: Postsurgical changes consistent with previous transmetatarsal amputation. No acute osseous findings or definite evidence of recurrent osteomyelitis. Electronically Signed   By: Carey Bullocks M.D.   On: 03/21/2023 14:34   DG Chest Port 1 View Result Date: 03/21/2023 CLINICAL DATA:  Questionable sepsis. Altered mental status. Nausea. EXAM: PORTABLE CHEST 1 VIEW COMPARISON:  Radiographs 08/09/2021 and 03/18/2017. FINDINGS: 1357 hours. Left subclavian AICD leads appear unchanged. The heart size and mediastinal contours are stable with aortic atherosclerosis and a left atrial appendage clip. The lungs are clear. There is no pleural effusion or pneumothorax. No acute osseous findings are evident. Telemetry leads overlie the chest. IMPRESSION: Stable postoperative chest. No evidence of active cardiopulmonary process. Electronically Signed   By: Carey Bullocks M.D.   On: 03/21/2023 14:32   Assessment/Plan JAVANNI CALDERON is a 78 y.o. male with medical history significant for insulin-dependent type 2 diabetes, diabetic peripheral neuropathy, hyperlipidemia, persistent atrial fibrillation on anticoagulation, ischemic cardiomyopathy EF 20% currently being treated for right lower extremity diabetic wound and is being admitted to the hospital with rhabdomyolysis after a fall at home today.   Rhabdomyolysis-with stable renal function, elevated CK after fall at home.  Received 500 cc normal saline in the emergency department.  Though he has severe ischemic  cardiomyopathy with last EF 20%, he looks very dry on exam so will hydrate gently. -Observation admission -Normal saline 125 cc/h for 1 L -Recheck CK in the morning  Heart failure with reduced EF-without evidence of acute decompensation, in fact patient looks dry on exam, lungs are  clear without hypoxia, orthopnea or other evidence of heart failure.  Given significant hypotension which is symptomatic, and likely resulted with fall at home yesterday, will hold some of his antihypertensives for now. -Continue Jardiance, metoprolol -Hold eplerenone, Entresto, torsemide -Cautiously hydrate as above, reevaluate fluid status in the morning  Weakness and falls-I suspect this may be due to overdiuresis due to some medication errors as described by his daughter (sometimes accidentally takes double doses of medications) and resulting dehydration with hypotension. -Hydrating as above, holding antihypertensives cautiously -PT/OT consult  Diabetic foot wound-followed by Dr. Lady Gary at the wound care center.  X-ray today without evidence of osteomyelitis or other complication. -Continue empiric IV cefepime -Wound care consult  Insulin-dependent type 2 diabetes-moderately well-controlled, last hemoglobin A1c 7.2 9/24. -Carb modified heart healthy diet -Continue Semglee 14 units daily -Will hold Premeal insulin for the time being, given current illness and not eating very well -Moderate dose sliding scale  Hyperlipidemia-Crestor  Atrial fibrillation-continue Tikosyn, Toprol-XL and Xarelto every evening  Depression-Celexa  Anxiety-Ativan at bedtime  DVT prophylaxis: Xarelto as above    Code Status: Full Code  Consults called: None  Admission status: Observation  Time spent: 48 minutes  Brendan Gadson Sharlette Dense MD [facility] Pager [phone]  If 7PM-7AM, please contact night-coverage [email or web address] Password [facility]  03/21/2023, 6:07 PM

## 2023-03-21 NOTE — Progress Notes (Signed)
ED Pharmacy Antibiotic Sign Off An antibiotic consult was received from an ED provider for Cefepime per pharmacy dosing for wound infection. A chart review was completed to assess appropriateness.   The following one time order(s) were placed:  Cefepime 2g IV x 1  Further antibiotic and/or antibiotic pharmacy consults should be ordered by the admitting provider if indicated.   Thank you for allowing pharmacy to be a part of this patient's care.    Kamala Kolton S. Merilynn Finland, PharmD, BCPS Clinical Staff Pharmacist Pasty Spillers, [facility]  Clinical Pharmacist 03/21/23 1:39 PM

## 2023-03-21 NOTE — ED Provider Notes (Signed)
[place] EMERGENCY DEPARTMENT AT [facility] Provider Note   CSN: [id number] Arrival date & time: 03/21/23  1239     History  Chief Complaint  Patient presents with   [name] is a 78 y.o. male.  Seven 70-year-old male with past medical history of diabetes and capitation's on the right foot with ongoing infection who had a Unaboot placed yesterday for this presenting to the emergency department today with generalized weakness.  The patient apparently had a fall last night and had a hard time getting up.  He was on the floor apparently for a few hours.  Patient reports he was feeling very dizzy before this.  He states that this has improved this morning.  He is currently on antibiotics for a diabetic foot infection.  He had the Unaboot placed yesterday and he states that they are "trying to save my foot".  The patient does have significant CHF with an ejection fraction of 20 to 25%.   Fall       Home Medications Prior to Admission medications   Medication Sig Start Date End Date Taking? Authorizing Provider  aspirin EC 81 MG tablet Take 81 mg by mouth daily. Swallow whole.    [provider]  Continuous Glucose Receiver (FREESTYLE LIBRE 2 READER) DEVI Use as instructure to check blood sugar 07/21/22   Carlus Pavlov, MD  Continuous Glucose Sensor (FREESTYLE LIBRE 2 SENSOR) MISC Apply new sensor every 14 days 09/05/22   Carlus Pavlov, MD  dofetilide (TIKOSYN) 500 MCG capsule TAKE 1 CAPSULE BY MOUTH 2 TIMES DAILY Patient taking differently: Take 500 mcg by mouth 2 (two) times daily. 01/02/17   Duke Salvia, MD  empagliflozin (JARDIANCE) 25 MG TABS tablet Take 25 mg by mouth daily.    [provider]  eplerenone (INSPRA) 25 MG tablet Take 25 mg by mouth daily. 02/10/20   [provider]  FLUoxetine (PROZAC) 10 MG capsule Take 1 capsule (10 mg total) by mouth daily. 08/24/22   Corwin Levins, MD  fluticasone (FLONASE) 50 MCG/ACT  nasal spray USE 2 SPRAYS IN EACH NOSTRIL EVERY DAY Patient taking differently: Place 2 sprays into both nostrils daily. 01/15/18   Corwin Levins, MD  Glucose Blood (FREESTYLE PRECISION NEO TEST VI) by In Vitro route.    [provider]  glucose blood (ONE TOUCH ULTRA TEST) test strip 1 each by Other route 2 (two) times daily. 12/20/16   Corwin Levins, MD  insulin lispro (HUMALOG KWIKPEN) 100 UNIT/ML KwikPen INJECT 10-14 units under skin before meals, 3 times a day, as advised 03/06/23   Carlus Pavlov, MD  Lancets MISC Use as directed up to 4 times per day 02/07/14   Corwin Levins, MD  LANTUS SOLOSTAR 100 UNIT/ML Solostar Pen INJECT 14 UNITS UNDER THE SKIN DAILY 01/30/23   Carlus Pavlov, MD  liraglutide (VICTOZA) 18 MG/3ML SOPN Inject 1.8 mg into the skin every evening.    [provider]  loratadine (CLARITIN) 10 MG tablet Take 10 mg by mouth daily.    [provider]  LORazepam (ATIVAN) 0.5 MG tablet Take 1 tablet (0.5 mg total) by mouth at bedtime. 08/24/22   Corwin Levins, MD  LORazepam (ATIVAN) 1 MG tablet TAKE 1/2 TABLET BY MOUTH EVERY DAY AS NEEDED FOR ANXIETY Patient taking differently: Take 0.5 mg by mouth 2 (two) times daily as needed for anxiety or sleep. 07/19/17   Corwin Levins, MD  Magnesium Oxide (MAG-OX 400 PO) Take 400 mg by mouth 2 (two) times daily.    [provider]  metoprolol (TOPROL-XL) 200 MG 24 hr tablet Take 200 mg by mouth daily. 12/31/19   [provider]  Multiple Vitamins-Minerals (MULTIVITAMIN PO) Take 1 tablet by mouth daily.    [provider]  NITROSTAT 0.4 MG SL tablet Place 1 tablet (0.4 mg total) under the tongue every 5 (five) minutes as needed for chest pain. 10/06/16   Duke Salvia, MD  rosuvastatin (CRESTOR) 20 MG tablet TAKE 1 TABLET BY MOUTH EVERY DAY Patient taking differently: Take 20 mg by mouth every evening. 08/30/18   Corwin Levins, MD  sacubitril-valsartan (ENTRESTO) 24-26 MG Take 1 tablet by  mouth 2 (two) times daily.    [provider]  torsemide (DEMADEX) 10 MG tablet Take 10 mg by mouth once.    [provider]  ULTICARE MICRO PEN NEEDLES 32G X 4 MM MISC USE DAILY AS DIRECTED 07/09/18   Romero Belling, MD  XARELTO 20 MG TABS tablet TAKE 1 TABLET BY MOUTH EVERY DAY Patient taking differently: Take 20 mg by mouth daily with supper. 04/24/17   Duke Salvia, MD  zolpidem (AMBIEN) 5 MG tablet TAKE 1 OR 2 TABLETS BY MOUTH NIGHTLY AT BEDTIME AS NEEDED Patient taking differently: Take 10 mg by mouth at bedtime as needed for sleep. 12/25/18   Corwin Levins, MD      Allergies    Rosanne Ashing allergy], Sulfa antibiotics, and Metformin and related    Review of Systems   Review of Systems  Neurological:  Positive for dizziness.  All other systems reviewed and are negative.   Physical Exam Updated Vital Signs BP 99/70   Pulse 73   Temp 98 F (36.7 C) (Oral)   Resp (!) 21   Ht 5\' 10"  (1.778 m)   Wt 87 kg   SpO2 94%   BMI 27.52 kg/m  Physical Exam Vitals and nursing note reviewed.   Gen: NAD Eyes: PERRL, EOMI HEENT: no oropharyngeal swelling Neck: trachea midline Resp: clear to auscultation bilaterally Card: RRR, no murmurs, rubs, or gallops Abd: nontender, nondistended Extremities: no calf tenderness, no edema on the left, the right foot/leg is in a Unaboot Vascular: 2+ radial pulses bilaterally, 2+ DP pulses bilaterally Neuro: Cranial nerves intact, equal strength and sensation throughout bilateral upper and lower extremities Skin: no rashes Psyc: acting appropriately   ED Results / Procedures / Treatments   Labs (all labs ordered are listed, but only abnormal results are displayed) Labs Reviewed  COMPREHENSIVE METABOLIC PANEL - Abnormal; Notable for the following components:      Result Value   Sodium 134 (*)    Glucose, Bld 148 (*)    BUN 28 (*)    Creatinine, Ser 1.25 (*)    AST 43 (*)    All other components within normal limits  CBC  WITH DIFFERENTIAL/PLATELET - Abnormal; Notable for the following components:   Platelets 140 (*)    Monocytes Absolute 1.3 (*)    All other components within normal limits  PROTIME-INR - Abnormal; Notable for the following components:   Prothrombin Time 22.7 (*)    INR 2.0 (*)    All other components within normal limits  APTT - Abnormal; Notable for the following components:   aPTT 40 (*)    All other components within normal limits  URINALYSIS, W/ REFLEX TO CULTURE (INFECTION SUSPECTED) - Abnormal; Notable for the  following components:   Glucose, UA >=500 (*)    All other components within normal limits  CK - Abnormal; Notable for the following components:   Total CK 1,750 (*)    All other components within normal limits  I-STAT CG4 LACTIC ACID, ED - Abnormal; Notable for the following components:   Lactic Acid, Venous 2.1 (*)    All other components within normal limits  CULTURE, BLOOD (ROUTINE X 2)  CULTURE, BLOOD (ROUTINE X 2)  I-STAT CG4 LACTIC ACID, ED    EKG None  Radiology DG Foot Complete Right Result Date: 03/21/2023 CLINICAL DATA:  Questionable sepsis-evaluate for abnormality. History of foot infection with recent antibiotics. EXAM: RIGHT FOOT COMPLETE - 3+ VIEW COMPARISON:  None Available. 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 No evidence of acute fracture, dislocation or recurrent osteomyelitis. Mild midfoot degenerative changes. There are bidirectional calcaneal spurs. Soft tissue evaluation limited by overlying bandages. No foreign body or definite soft tissue emphysema identified. IMPRESSION: Postsurgical changes consistent with previous transmetatarsal amputation. No acute osseous findings or definite evidence of recurrent osteomyelitis. Electronically Signed   By: Carey Bullocks M.D.   On: 03/21/2023 14:34   DG Chest Port 1 View Result Date: 03/21/2023 CLINICAL  DATA:  Questionable sepsis. Altered mental status. Nausea. EXAM: PORTABLE CHEST 1 VIEW COMPARISON:  Radiographs 08/09/2021 and 03/18/2017. FINDINGS: 1357 hours. Left subclavian AICD leads appear unchanged. The heart size and mediastinal contours are stable with aortic atherosclerosis and a left atrial appendage clip. The lungs are clear. There is no pleural effusion or pneumothorax. No acute osseous findings are evident. Telemetry leads overlie the chest. IMPRESSION: Stable postoperative chest. No evidence of active cardiopulmonary process. Electronically Signed   By: Carey Bullocks M.D.   On: 03/21/2023 14:32    Procedures Procedures    Medications Ordered in ED Medications  sodium chloride 0.9 % bolus 500 mL (has no administration in time range)  ceFEPIme (MAXIPIME) 2 g in sodium chloride 0.9 % 100 mL IVPB (0 g Intravenous Stopped 03/21/23 1500)    ED Course/ Medical Decision Making/ A&P Clinical Course as of 03/21/23 1626  Tue Mar 21, 2023  1619 Received signout from Dr. Rhae Hammock; pending CT head.  Plan to admit for rhabdo as well as lower extremity wound infections. [TY]    Clinical Course User Index [TY] Coral Spikes, DO                                 Medical Decision Making 78 year old male with past medical history of advanced CHF, diabetes, hypertension, and hyperlipidemia presenting to the emergency department today with dizziness that has since improved.  The patient is borderline hypotensive here.  I will further evaluate him here with a sepsis workup to screen for sepsis.  Will obtain x-ray of his foot to evaluate for osteomyelitis.  But the discharge looks like he was growing Proteus and Pseudomonas.  Will cover with cefepime.  The patient is initial labs are reassuring.  He did have 1 blood pressure in the high 80s.  Given a small fluid bolus.  Will avoid significant fluid resuscitation given his history of advanced CHF.  Blood pressure is improving with small fluid bolus here.   CK is elevated to require admission.  Signed out to oncoming provider with CT pending.  Amount and/or Complexity of Data Reviewed Labs: ordered. Radiology:  ordered.           Final Clinical Impression(s) / ED Diagnoses Final diagnoses:  Elevated CK  Wound infection    Rx / DC Orders ED Discharge Orders     None         Durwin Glaze, MD 03/21/23 1626

## 2023-03-21 NOTE — ED Triage Notes (Signed)
C/o AMS(forgetting what he had for dinner), nausea, and feeling like BP is low x1 day.  Pt reports fall last night landing on coffee table with pain to left hip. Denies hitting head. Pt is currently on xarelto.  Pt reports finishing abx 2 weeks ago for foot and started a new one yesterday.  Also increase in torsemide 1 month ago.

## 2023-03-22 ENCOUNTER — Encounter (HOSPITAL_BASED_OUTPATIENT_CLINIC_OR_DEPARTMENT_OTHER): Payer: Medicare Other | Admitting: General Surgery

## 2023-03-22 DIAGNOSIS — E11621 Type 2 diabetes mellitus with foot ulcer: Secondary | ICD-10-CM | POA: Diagnosis present

## 2023-03-22 DIAGNOSIS — Z794 Long term (current) use of insulin: Secondary | ICD-10-CM | POA: Diagnosis not present

## 2023-03-22 DIAGNOSIS — Z95810 Presence of automatic (implantable) cardiac defibrillator: Secondary | ICD-10-CM | POA: Diagnosis not present

## 2023-03-22 DIAGNOSIS — R9431 Abnormal electrocardiogram [ECG] [EKG]: Secondary | ICD-10-CM | POA: Diagnosis not present

## 2023-03-22 DIAGNOSIS — R7989 Other specified abnormal findings of blood chemistry: Secondary | ICD-10-CM

## 2023-03-22 DIAGNOSIS — N179 Acute kidney failure, unspecified: Secondary | ICD-10-CM | POA: Diagnosis not present

## 2023-03-22 DIAGNOSIS — J811 Chronic pulmonary edema: Secondary | ICD-10-CM | POA: Diagnosis not present

## 2023-03-22 DIAGNOSIS — I255 Ischemic cardiomyopathy: Secondary | ICD-10-CM | POA: Diagnosis not present

## 2023-03-22 DIAGNOSIS — J9601 Acute respiratory failure with hypoxia: Secondary | ICD-10-CM | POA: Diagnosis not present

## 2023-03-22 DIAGNOSIS — I4821 Permanent atrial fibrillation: Secondary | ICD-10-CM

## 2023-03-22 DIAGNOSIS — M6282 Rhabdomyolysis: Secondary | ICD-10-CM | POA: Diagnosis present

## 2023-03-22 DIAGNOSIS — I5043 Acute on chronic combined systolic (congestive) and diastolic (congestive) heart failure: Secondary | ICD-10-CM | POA: Diagnosis not present

## 2023-03-22 DIAGNOSIS — E1142 Type 2 diabetes mellitus with diabetic polyneuropathy: Secondary | ICD-10-CM | POA: Diagnosis present

## 2023-03-22 DIAGNOSIS — F32A Depression, unspecified: Secondary | ICD-10-CM | POA: Diagnosis present

## 2023-03-22 DIAGNOSIS — R0602 Shortness of breath: Secondary | ICD-10-CM | POA: Diagnosis not present

## 2023-03-22 DIAGNOSIS — B964 Proteus (mirabilis) (morganii) as the cause of diseases classified elsewhere: Secondary | ICD-10-CM | POA: Diagnosis present

## 2023-03-22 DIAGNOSIS — I517 Cardiomegaly: Secondary | ICD-10-CM | POA: Diagnosis not present

## 2023-03-22 DIAGNOSIS — I420 Dilated cardiomyopathy: Secondary | ICD-10-CM | POA: Diagnosis present

## 2023-03-22 DIAGNOSIS — I480 Paroxysmal atrial fibrillation: Secondary | ICD-10-CM | POA: Diagnosis not present

## 2023-03-22 DIAGNOSIS — I4819 Other persistent atrial fibrillation: Secondary | ICD-10-CM | POA: Diagnosis present

## 2023-03-22 DIAGNOSIS — E874 Mixed disorder of acid-base balance: Secondary | ICD-10-CM | POA: Diagnosis present

## 2023-03-22 DIAGNOSIS — Z5181 Encounter for therapeutic drug level monitoring: Secondary | ICD-10-CM | POA: Diagnosis not present

## 2023-03-22 DIAGNOSIS — E860 Dehydration: Secondary | ICD-10-CM | POA: Diagnosis present

## 2023-03-22 DIAGNOSIS — I5042 Chronic combined systolic (congestive) and diastolic (congestive) heart failure: Secondary | ICD-10-CM | POA: Diagnosis not present

## 2023-03-22 DIAGNOSIS — T796XXD Traumatic ischemia of muscle, subsequent encounter: Secondary | ICD-10-CM

## 2023-03-22 DIAGNOSIS — R748 Abnormal levels of other serum enzymes: Secondary | ICD-10-CM | POA: Diagnosis not present

## 2023-03-22 DIAGNOSIS — E1165 Type 2 diabetes mellitus with hyperglycemia: Secondary | ICD-10-CM | POA: Diagnosis present

## 2023-03-22 DIAGNOSIS — I951 Orthostatic hypotension: Secondary | ICD-10-CM | POA: Diagnosis not present

## 2023-03-22 DIAGNOSIS — Z1152 Encounter for screening for COVID-19: Secondary | ICD-10-CM | POA: Diagnosis not present

## 2023-03-22 DIAGNOSIS — L97519 Non-pressure chronic ulcer of other part of right foot with unspecified severity: Secondary | ICD-10-CM | POA: Diagnosis present

## 2023-03-22 DIAGNOSIS — I110 Hypertensive heart disease with heart failure: Secondary | ICD-10-CM | POA: Diagnosis present

## 2023-03-22 DIAGNOSIS — I2510 Atherosclerotic heart disease of native coronary artery without angina pectoris: Secondary | ICD-10-CM | POA: Diagnosis not present

## 2023-03-22 DIAGNOSIS — D696 Thrombocytopenia, unspecified: Secondary | ICD-10-CM | POA: Diagnosis present

## 2023-03-22 DIAGNOSIS — R4182 Altered mental status, unspecified: Secondary | ICD-10-CM | POA: Diagnosis present

## 2023-03-22 DIAGNOSIS — E11628 Type 2 diabetes mellitus with other skin complications: Secondary | ICD-10-CM | POA: Diagnosis present

## 2023-03-22 DIAGNOSIS — J123 Human metapneumovirus pneumonia: Secondary | ICD-10-CM | POA: Diagnosis not present

## 2023-03-22 DIAGNOSIS — Z89431 Acquired absence of right foot: Secondary | ICD-10-CM | POA: Diagnosis not present

## 2023-03-22 DIAGNOSIS — E871 Hypo-osmolality and hyponatremia: Secondary | ICD-10-CM | POA: Diagnosis present

## 2023-03-22 DIAGNOSIS — I484 Atypical atrial flutter: Secondary | ICD-10-CM | POA: Diagnosis not present

## 2023-03-22 LAB — CBC
HCT: 42.3 % (ref 39.0–52.0)
Hemoglobin: 13.8 g/dL (ref 13.0–17.0)
MCH: 29.8 pg (ref 26.0–34.0)
MCHC: 32.6 g/dL (ref 30.0–36.0)
MCV: 91.4 fL (ref 80.0–100.0)
Platelets: 131 10*3/uL — ABNORMAL LOW (ref 150–400)
RBC: 4.63 MIL/uL (ref 4.22–5.81)
RDW: 14.3 % (ref 11.5–15.5)
WBC: 8.2 10*3/uL (ref 4.0–10.5)
nRBC: 0 % (ref 0.0–0.2)

## 2023-03-22 LAB — GLUCOSE, CAPILLARY
Glucose-Capillary: 139 mg/dL — ABNORMAL HIGH (ref 70–99)
Glucose-Capillary: 184 mg/dL — ABNORMAL HIGH (ref 70–99)
Glucose-Capillary: 148 mg/dL — ABNORMAL HIGH (ref 70–99)
Glucose-Capillary: 164 mg/dL — ABNORMAL HIGH (ref 70–99)

## 2023-03-22 LAB — BASIC METABOLIC PANEL
Anion gap: 10 (ref 5–15)
BUN: 28 mg/dL — ABNORMAL HIGH (ref 8–23)
CO2: 22 mmol/L (ref 22–32)
Calcium: 8.6 mg/dL — ABNORMAL LOW (ref 8.9–10.3)
Chloride: 102 mmol/L (ref 98–111)
Creatinine, Ser: 1.14 mg/dL (ref 0.61–1.24)
GFR, Estimated: >60 mL/min (ref >60)
Glucose, Bld: 155 mg/dL — ABNORMAL HIGH (ref 70–99)
Potassium: 4 mmol/L (ref 3.5–5.1)
Sodium: 134 mmol/L — ABNORMAL LOW (ref 135–145)

## 2023-03-22 LAB — CK: Total CK: 1151 U/L — ABNORMAL HIGH (ref 49–397)

## 2023-03-22 LAB — LACTIC ACID, PLASMA: Lactic Acid, Venous: 4.1 mmol/L (ref 0.5–1.9)

## 2023-03-22 LAB — MAGNESIUM: Magnesium: 2.1 mg/dL (ref 1.7–2.4)

## 2023-03-22 MED ORDER — SODIUM CHLORIDE 0.9 % IV SOLN: INTRAVENOUS | Status: AC

## 2023-03-22 MED ORDER — SODIUM CHLORIDE 0.9 % IV BOLUS
1000.0000 mL | Freq: Once | INTRAVENOUS | Status: AC
  Administered 2023-03-22: 1000 mL via INTRAVENOUS

## 2023-03-22 MED ORDER — ZOLPIDEM TARTRATE 5 MG PO TABS
5.0000 mg | ORAL_TABLET | Freq: Every evening | ORAL | Status: AC | PRN
  Administered 2023-03-22: 10 mg via ORAL
  Administered 2023-03-24: 5 mg via ORAL
  Administered 2023-03-24: 10 mg via ORAL
  Administered 2023-03-25: 5 mg via ORAL
  Filled 2023-03-22: qty 1
  Filled 2023-03-22 (×2): qty 2
  Filled 2023-03-22: qty 1
  Filled 2023-03-22: qty 2

## 2023-03-22 MED ORDER — DOFETILIDE 250 MCG PO CAPS
500.0000 ug | ORAL_CAPSULE | Freq: Two times a day (BID) | ORAL | Status: DC
  Administered 2023-03-22 – 2023-03-23 (×2): 500 ug via ORAL
  Filled 2023-03-22 (×3): qty 2

## 2023-03-22 NOTE — Consult Note (Signed)
CARDIOLOGY CONSULT NOTE       Patient ID: [name] MRN: [id number] DOB/AGE: Apr 17, 1945 78 y.o.  Admit date: 03/21/2023 Referring Physician: Kirby Crigler Primary Physician: Corwin Levins, MD Primary Cardiologist: Klein/ Sheilah Mins Reason for Consultation: CHF, PAF Medication Management  Principal Problem:   Rhabdomyolysis   HPI:  78 y.o. admitted with IDDM, neuropathy, HLD, persistent afib on Tikosyn CAD with ischemic DCM EF 20% Patient found down at home by daughter transported to [facility] with rhabdo and Rx for Proteus wound infection. On admission he appeared dehydrated. No angina, palpitations, syncope. He has had ablation with convergent Rx at [facility] and ligation of LAA. He is on Tikosyn Primary service concerned about QT interval but patient has BiV AICD in and is V pacing with QT interval < 1/2 RR. Mg normal 2.1 , K 4.0 and Cr 1.14 He is currently being hydrated  Review of telemetry shows V pacing P waves hard to discern.  ECG with LBBB V pacing ? Underlying afib QT 434/522   ROS All other systems reviewed and negative except as noted above  Past Medical History:  Diagnosis Date   CALLUS, RIGHT FOOT 12/08/2009   Cardiac arrest (HCC)    Chronic systolic heart failure (HCC) 11/03/2009   DIABETES MELLITUS, TYPE II 12/05/2006   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/2009   HYPERLIPIDEMIA 11/25/2006   HYPOGONADISM, MALE 12/05/2006   Implantable cardiac defibrillator MDT 09/22/2008   Change out feb 2011, 2015   Ischemic cardiomyopathy 09/22/2008   DES CX and RCA  70% residual LAD  Done in [place]; EF 30%;; myoviewq 31-Mar-2006 no ischemia   Kidney stones    Long term (current) use of anticoagulants 04/25/2010   Myocardial infarction [facility]) 2004/03/31   "I died and they brought me back"   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   NEPHROLITHIASIS, HX OF 02/25/2008   Persistent atrial fibrillation (HCC) 11/25/2006   Inapp shock 2010-03-31 AF VR >220 recurrent    Family History  Problem Relation Age of  Onset   Diabetes Maternal Grandmother    Hyperlipidemia Maternal Grandmother    Breast cancer Mother    Cancer Neg Hx    COPD Neg Hx    Heart disease Neg Hx     Social History   Socioeconomic History   Marital status: Divorced    Spouse name: Not on file   Number of children: 1   Years of education: 16   Highest education level: Not on file  Occupational History   Occupation: Musician business and show Aeronautical engineer: [employer]  Tobacco Use   Smoking status: Never   Smokeless tobacco: Never  Vaping Use   Vaping status: Never Used  Substance and Sexual Activity   Alcohol use: Yes    Alcohol/week: 0.0 standard drinks of alcohol    Comment: summer    Drug use: No   Sexual activity: Yes    Partners: Female  Other Topics Concern   Not on file  Social History Narrative   HSG, College grad. Married - divorced. 1 daughter. owner/operator Optician, dispensing business and show production   Social Drivers of Health   Financial Resource Strain: Low Risk  (03/18/2022)   Overall Financial Resource Strain (CARDIA)    Difficulty of Paying Living Expenses: Not hard at all  Food Insecurity: No Food Insecurity (03/21/2023)   Hunger Vital Sign    Worried About Running Out of Food in the Last Year: Never  true    Ran Out of Food in the Last Year: Never true  Transportation Needs: No Transportation Needs (03/21/2023)   PRAPARE - Administrator, Civil Service (Medical): No    Lack of Transportation (Non-Medical): No  Physical Activity: Inactive (03/18/2022)   Exercise Vital Sign    Days of Exercise per Week: 0 days    Minutes of Exercise per Session: 0 min  Stress: No Stress Concern Present (03/18/2022)   [employer] of Occupational Health - Occupational Stress Questionnaire    Feeling of Stress : Not at all  Social Connections: Socially Isolated (03/21/2023)   Social Connection and Isolation Panel [NHANES]    Frequency of Communication with  Friends and Family: More than three times a week    Frequency of Social Gatherings with Friends and Family: Twice a week    Attends Religious Services: Never    Database administrator or Organizations: No    Attends Banker Meetings: Never    Marital Status: Divorced  Catering manager Violence: Not At Risk (03/21/2023)   Humiliation, Afraid, Rape, and Kick questionnaire    Fear of Current or Ex-Partner: No    Emotionally Abused: No    Physically Abused: No    Sexually Abused: No    Past Surgical History:  Procedure Laterality Date   CARDIAC CATHETERIZATION     feburary 2014   CARDIAC DEFIBRILLATOR PLACEMENT     Guidant Vitality [name]   CARDIOVERSION N/A 04/25/2012   Procedure: CARDIOVERSION;  Surgeon: Cassell Clement, MD;  Location: [facility] ENDOSCOPY;  Service: Cardiovascular;  Laterality: N/A;   CARDIOVERSION N/A 05/21/2012   Procedure: CARDIOVERSION;  Surgeon: Duke Salvia, MD;  Location: [facility] ENDOSCOPY;  Service: Cardiovascular;  Laterality: N/A;   CATARACT EXTRACTION     EYE SURGERY     cataracts   IMPLANTABLE CARDIOVERTER DEFIBRILLATOR (ICD) GENERATOR CHANGE N/A 02/05/2014   Procedure: ICD GENERATOR CHANGE;  Surgeon: Duke Salvia, MD;  Location: [facility] CATH LAB;  Service: Cardiovascular;  Laterality: N/A;   PTCA     TONSILLECTOMY     TONSILLECTOMY     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   Procedure: TRANSMETATARSAL AMPUTATION RIGHT;  Surgeon: Cephus Shelling, MD;  Location: [facility] OR;  Service: Vascular;  Laterality: Right;      Current Facility-Administered Medications:    0.9 %  sodium chloride infusion, , Intravenous, Continuous, Kathlen Mody, MD   acetaminophen (TYLENOL) tablet 650 mg, 650 mg, Oral, Q6H PRN **OR** acetaminophen (TYLENOL) suppository 650 mg, 650 mg, Rectal, Q6H PRN, Kirby Crigler, Mir M, MD   aspirin EC tablet 81 mg, 81 mg, Oral, Daily, Kirby Crigler, Mir M, MD, 81 mg at 03/22/23 0912   ceFEPIme (MAXIPIME) 2 g in sodium chloride 0.9 % 100 mL  IVPB, 2 g, Intravenous, Q12H, Cindi Carbon, RPH, Last Rate: 200 mL/hr at 03/22/23 0539, 2 g at 03/22/23 0539   empagliflozin (JARDIANCE) tablet 25 mg, 25 mg, Oral, Daily, Kirby Crigler, Mir M, MD, 25 mg at 03/22/23 0912   fluticasone (FLONASE) 50 MCG/ACT nasal spray 2 spray, 2 spray, Each Nare, Daily, Kirby Crigler, Mir M, MD, 2 spray at 03/22/23 0913   insulin aspart (novoLOG) injection 0-15 Units, 0-15 Units, Subcutaneous, TID WC, Kirby Crigler, Mir M, MD, 2 Units at 03/22/23 2130   insulin aspart (novoLOG) injection 0-5 Units, 0-5 Units, Subcutaneous, QHS, Kirby Crigler, Mir M, MD   insulin glargine-yfgn [facility]) injection 14 Units, 14 Units, Subcutaneous, Daily, Kirby Crigler, Mir M, MD, 14 Units at 03/22/23 [phone]  loratadine (CLARITIN) tablet 10 mg, 10 mg, Oral, Daily, Kirby Crigler, Mir M, MD, 10 mg at 03/22/23 0913   LORazepam (ATIVAN) tablet 0.5 mg, 0.5 mg, Oral, BID PRN, Kirby Crigler, Mir M, MD, 0.5 mg at 03/22/23 0047   LORazepam (ATIVAN) tablet 0.5 mg, 0.5 mg, Oral, QHS, Kirby Crigler, Mir M, MD, 0.5 mg at 03/21/23 2117   metoprolol succinate (TOPROL-XL) 24 hr tablet 200 mg, 200 mg, Oral, QPC supper, Kirby Crigler, Mir M, MD   ondansetron [facility]) tablet 4 mg, 4 mg, Oral, Q6H PRN **OR** ondansetron (ZOFRAN) injection 4 mg, 4 mg, Intravenous, Q6H PRN, Kirby Crigler, Mir M, MD   rivaroxaban Carlena Hurl) tablet 20 mg, 20 mg, Oral, QPC supper, Kirby Crigler, Mir M, MD, 20 mg at 03/21/23 2040   sodium chloride 0.9 % bolus 1,000 mL, 1,000 mL, Intravenous, Once, Kathlen Mody, MD   zolpidem (AMBIEN) tablet 5 mg, 5 mg, Oral, QHS PRN, Kirby Crigler, Mir M, MD, 5 mg at 03/21/23 2040  aspirin EC  81 mg Oral Daily   empagliflozin  25 mg Oral Daily   fluticasone  2 spray Each Nare Daily   insulin aspart  0-15 Units Subcutaneous TID WC   insulin aspart  0-5 Units Subcutaneous QHS   insulin glargine-yfgn  14 Units Subcutaneous Daily   loratadine  10 mg Oral Daily   LORazepam  0.5 mg Oral QHS   metoprolol  200 mg Oral QPC supper    rivaroxaban  20 mg Oral QPC supper    sodium chloride     ceFEPime (MAXIPIME) IV 2 g (03/22/23 0539)   sodium chloride      Physical Exam: Blood pressure 97/64, pulse 74, temperature 99.1 F (37.3 C), temperature source Oral, resp. rate (!) 24, height 5\' 10"  (1.778 m), weight 84.6 kg, SpO2 97%.    Chronically ill white male Dry JVP not elevated AICD under left clavicle Abdomen benign RLE in cast with infection  No edema  Labs:   Lab Results  Component Value Date   WBC 8.2 03/22/2023   HGB 13.8 03/22/2023   HCT 42.3 03/22/2023   MCV 91.4 03/22/2023   PLT 131 (L) 03/22/2023    Recent Labs  Lab 03/21/23 1405 03/22/23 0517  NA 134* 134*  K 4.1 4.0  CL 99 102  CO2 23 22  BUN 28* 28*  CREATININE 1.25* 1.14  CALCIUM 9.5 8.6*  PROT 7.2  --   BILITOT 0.8  --   ALKPHOS 62  --   ALT 17  --   AST 43*  --   GLUCOSE 148* 155*   Lab Results  Component Value Date   CKTOTAL 1,151 (H) 03/22/2023   TROPONINI <0.03 05/22/2015    Lab Results  Component Value Date   CHOL 122 08/24/2022   CHOL 136 08/09/2021   CHOL 119 09/11/2019   Lab Results  Component Value Date   HDL 35.60 (L) 08/24/2022   HDL 38.30 (L) 08/09/2021   HDL 31 (L) 09/11/2019   Lab Results  Component Value Date   LDLCALC 60 08/09/2021   LDLCALC 49 09/11/2019   LDLCALC 91 02/11/2015   Lab Results  Component Value Date   TRIG 270.0 (H) 08/24/2022   TRIG 186.0 (H) 08/09/2021   TRIG 374 (H) 09/11/2019   Lab Results  Component Value Date   CHOLHDL 3 08/24/2022   CHOLHDL 4 08/09/2021   CHOLHDL 3.8 09/11/2019   Lab Results  Component Value Date   LDLDIRECT 63.0 08/24/2022   LDLDIRECT 134.0 10/17/2017  LDLDIRECT 100.0 10/12/2016      Radiology: CT Head Wo Contrast Result Date: 03/21/2023 CLINICAL DATA:  Head trauma. EXAM: CT HEAD WITHOUT CONTRAST TECHNIQUE: Contiguous axial images were obtained from the base of the skull through the vertex without intravenous contrast. RADIATION DOSE  REDUCTION: This exam was performed according to the departmental dose-optimization program which includes automated exposure control, adjustment of the mA and/or kV according to patient size and/or use of iterative reconstruction technique. COMPARISON:  Head CT dated 03/19/2017. FINDINGS: Brain: The ventricles and sulci are appropriate size for the patient's age. The gray-white matter discrimination is preserved. There is no acute intracranial hemorrhage. No mass effect or midline shift. No extra-axial fluid collection. Vascular: No hyperdense vessel or unexpected calcification. Skull: Normal. Negative for fracture or focal lesion. Sinuses/Orbits: No acute finding. Other: None IMPRESSION: No acute intracranial pathology. Electronically Signed   By: Elgie Collard M.D.   On: 03/21/2023 16:41   DG Foot Complete Right Result Date: 03/21/2023 CLINICAL DATA:  Questionable sepsis-evaluate for abnormality. History of foot infection with recent antibiotics. EXAM: RIGHT FOOT COMPLETE - 3+ VIEW COMPARISON:  None Available. 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 No evidence of acute fracture, dislocation or recurrent osteomyelitis. Mild midfoot degenerative changes. There are bidirectional calcaneal spurs. Soft tissue evaluation limited by overlying bandages. No foreign body or definite soft tissue emphysema identified. IMPRESSION: Postsurgical changes consistent with previous transmetatarsal amputation. No acute osseous findings or definite evidence of recurrent osteomyelitis. Electronically Signed   By: Carey Bullocks M.D.   On: 03/21/2023 14:34   DG Chest Port 1 View Result Date: 03/21/2023 CLINICAL DATA:  Questionable sepsis. Altered mental status. Nausea. EXAM: PORTABLE CHEST 1 VIEW COMPARISON:  Radiographs 08/09/2021 and 03/18/2017. FINDINGS: 1357 hours. Left subclavian AICD leads appear unchanged. The heart size  and mediastinal contours are stable with aortic atherosclerosis and a left atrial appendage clip. The lungs are clear. There is no pleural effusion or pneumothorax. No acute osseous findings are evident. Telemetry leads overlie the chest. IMPRESSION: Stable postoperative chest. No evidence of active cardiopulmonary process. Electronically Signed   By: Carey Bullocks M.D.   On: 03/21/2023 14:32    EKG: See HPI   ASSESSMENT AND PLAN:   PAF:  no rapid rates on xarelto Prior convergent Rx at [facility] with LAA ? Percutaneous ligation. His QT is fine in setting of V pacing K/Mg ok Will resume his home dose of tikosyn CAD:  prior stenting of all 3 arteries no angina ASA and beta blocker Holding statin with rhabdo DCM:  EF 20% dehydrated holding home jardiaance, inspra, entresto , demadex Can reintroduce in 48 hours when rhabdo improved and not dehydrated.  DM:  per primary service  Rhabdo:  Cr ok trend CPK's  ID:  RLE infection on Levaquin has been seeing wound center Transmetatarsal amputation Xray 1/21 no acute evidence of osteomyelitis  Signed: Charlton Haws 03/22/2023, 12:40 PM

## 2023-03-22 NOTE — Progress Notes (Signed)
MEWS Progress Note  Patient Details Name: [name] MRN: [id number] DOB: 1945-09-10 Today's Date: 03/22/2023   MEWS Flowsheet Documentation:  Assess: MEWS Score Temp: 99.1 F (37.3 C) BP: 97/64 MAP (mmHg): 76 Pulse Rate: 74 ECG Heart Rate: 75 Resp: (!) 24 Level of Consciousness: Alert SpO2: 97 % O2 Device: Room Air Assess: MEWS Score MEWS Temp: 0 MEWS Systolic: 1 MEWS Pulse: 0 MEWS RR: 1 MEWS LOC: 0 MEWS Score: 2 MEWS Score Color: Yellow Assess: SIRS CRITERIA SIRS Temperature : 0 SIRS Respirations : 1 SIRS Pulse: 0 SIRS WBC: 0 SIRS Score Sum : 1 SIRS Temperature : 0 SIRS Pulse: 0 SIRS Respirations : 0 SIRS WBC: 0 SIRS Score Sum : 0 Assess: if the MEWS score is Yellow or Red Were vital signs accurate and taken at a resting state?: Yes Does the patient meet 2 or more of the SIRS criteria?: No MEWS guidelines implemented : Yes, yellow Treat MEWS Interventions: Considered administering scheduled or prn medications/treatments as ordered Take Vital Signs Increase Vital Sign Frequency : Yellow: Q2hr x1, continue Q4hrs until patient remains green for 12hrs Escalate MEWS: Escalate: Yellow: Discuss with charge nurse and consider notifying provider and/or RRT Notify: Charge Nurse/RN Name of Charge Nurse/RN Notified: Architectural technologist Provider Notification Provider Name/Title: Blake Divine Date Provider Notified: 03/22/23 Time Provider Notified: 1213 Method of Notification: Page Notification Reason: Other (Comment) (change in vital signs) Provider response: See new orders Date of Provider Response: 03/22/23 Time of Provider Response: 1213 Notify: Rapid Response Name of Rapid Response RN Notified:  (no)      Drema Dallas 03/22/2023, 12:14 PM

## 2023-03-22 NOTE — Consult Note (Addendum)
WOC Nurse Consult Note: Reason for Consult: right foot in boot that needs to be changed today Patient was seen 03/20/23 by Dr. Lady Gary in the [facility]  Wound type: Neuropathic foot ulcer RLE Pressure Injury POA: NA Measurement: 03/20/23; post debridement right plantar 0.8cm x 1.2cm x 0.2cm   Dressing procedure/placement/frequency: Patient has been placed in TCC (Total contact cast) to allow for offloading of this ulcer.  Scheduled for change 03/22/23, however patient sustained a fall at home and now admitted inpatient.  Topical care; cleanse with Vashe Hart [place] [phone]), pat dry. Apply Mupriocine ointment (will DC while inpatient) as to not affect silver contact with wound bed,  top with silver collagen (will not be available inpatient), will substitute silver hydrofiber while inpatient. Cover with foam.   I have sent a [place] to the hospitalist and Dr. Lady Gary.  "Dr Lady Gary we do not change TCC inpatient. Do you want it removed until he returns. I am not even sure if our orthopedic techs will remove but I will write orders. If I do not hear back I will assume to leave in place"  Also made bedside nursing aware to advise patient of same.  "Crista, please advise patient and family that we are working with [facility] on this. I will wait to hear from Dr. Blake Divine and Dr. Lady Gary on removal (If even possible)inpatient. We definitely can not replace, Ortho tech does not have the skill set and training to reapply and WOCs do not apply TCC inpatient.   If the TCC is to be removed and replaced I will place an order for the orthopedic techs to remove. Bedside nursing can change topical wound care dressing. Change every other day while inpatient.   FU with WCC at DC to have TCC reapplied if patient/family and provider agree.   Discussed POC with patient and bedside nurse.  Re consult if needed, will not follow at this time. Thanks  Flynt Breeze [employer], RN,CWOCN, CNS, CWON-AP [phone];  back from Dr. Lady Gary with  instructions for TCC removal.  I have updated the orders, bedside nursing aware to page orthopedic tech.  If TCC can not be removed for any reason I will notify [facility] and they will need to see him asap after inpatient DC.   Trasean [facility], CNS, [employer] [phone]

## 2023-03-22 NOTE — Progress Notes (Signed)
[facility]                                                                               [name], is a 78 y.o. male, DOB - 04-01-1945, ZOX:[id number] Admit date - 03/21/2023    Outpatient Primary MD for the patient is Corwin Levins, MD  LOS - 0  days    Brief summary    [name] is a 78 y.o. male with medical history significant for insulin-dependent type 2 diabetes, diabetic peripheral neuropathy, hyperlipidemia, persistent atrial fibrillation on anticoagulation, ischemic cardiomyopathy EF 20% currently being treated for right lower extremity diabetic wound and is being admitted to the hospital with rhabdomyolysis after a fall at home today.   He has been followed by Dr. Lady Gary for a right lower extremity diabetic wound, recent outpatient cultures grew Proteus and he has been placed on oral Levaquin.  Workup in the emergency department is actually quite benign, though he is significantly hypotensive, and has elevated CK.  He was admitted for hypotension and rhabdomyolysis.    Assessment & Plan    Assessment and Plan:    Rhabdomyolysis:  Improving slowly.  He has a h/o severe ischemic cardiomyopathy with EF of 20%. He appears to be very dry. Check CK in am.  Orthostatic hypotension:  Elevated lactic acid/ lactic acidosis Suspect from dehydration.  Holding all anti hypertensive meds.  Continue with NS for another 24 hours.   Generalized Weakness and falls suspect this may be due to overdiuresis due to some medication errors as described by his daughter (sometimes accidentally takes double doses of medications) and resulting dehydration with hypotension.  Holding all  anti hypertensive meds today. Therapy eval in am.     Diabetic foot wound X-ray today without evidence of osteomyelitis or other complication.  Follows up with Dr Lady Gary as outpatient Wound care on board.   Insulin Dependent Type 2 DM CBG (last 3)  Recent Labs    03/21/23 2039  03/22/23 0731 03/22/23 1216  GLUCAP 141* 139* 184*   Resume SSI.  A1c is 7.2%.  Recheck A1c .    Hyperlipidemia Holding crestor.    Atrial fibrillation Rate controlled.  Continue tikosyn, it was held yesterday.  Continue with metoprolol.  On xarelto for anticoagulation.    Anxiety  Resume ativan.    Depression Resume Celexa.       Estimated body mass index is 26.76 kg/m as calculated from the following:   Height as of this encounter: 5\' 10"  (1.778 m).   Weight as of this encounter: 84.6 kg.  Code Status: full code. DVT Prophylaxis:  SCDs Start: 03/21/23 1728 rivaroxaban (XARELTO) tablet 20 mg   Level of Care: Level of care: Med-Surg Family Communication: none at bedside.   Disposition Plan:     Remains inpatient appropriate:  pending clinical improvement.   Procedures:  None.   Consultants:   Cardiology.  Antimicrobials:   Anti-infectives (From admission, onward)    Start     Dose/Rate Route Frequency Ordered Stop   03/22/23 0500  ceFEPIme (MAXIPIME) 2 g in sodium chloride 0.9 % 100 mL IVPB  2 g 200 mL/hr over 30 Minutes Intravenous Every 12 hours 03/21/23 1811     03/21/23 1345  ceFEPIme (MAXIPIME) 2 g in sodium chloride 0.9 % 100 mL IVPB        2 g 200 mL/hr over 30 Minutes Intravenous  Once 03/21/23 1338 03/21/23 1500        Medications  Scheduled Meds:  aspirin EC  81 mg Oral Daily   dofetilide  500 mcg Oral BID   empagliflozin  25 mg Oral Daily   fluticasone  2 spray Each Nare Daily   insulin aspart  0-15 Units Subcutaneous TID WC   insulin aspart  0-5 Units Subcutaneous QHS   insulin glargine-yfgn  14 Units Subcutaneous Daily   loratadine  10 mg Oral Daily   LORazepam  0.5 mg Oral QHS   metoprolol  200 mg Oral QPC supper   rivaroxaban  20 mg Oral QPC supper   Continuous Infusions:  sodium chloride     ceFEPime (MAXIPIME) IV 2 g (03/22/23 0539)   sodium chloride     PRN Meds:.acetaminophen **OR** acetaminophen,  LORazepam, ondansetron **OR** ondansetron (ZOFRAN) IV, zolpidem    Subjective:   Karla Summerlin was seen and examined today. Feels dizzy.    Objective:   Vitals:   03/22/23 1159 03/22/23 1202 03/22/23 1206 03/22/23 1209  BP: 105/63 104/68 (!) 97/56 97/64  Pulse: 76 79 77 74  Resp: (!) 24     Temp: 99.1 F (37.3 C) 99.1 F (37.3 C) 99.1 F (37.3 C) 99.1 F (37.3 C)  TempSrc: Oral Oral Oral Oral  SpO2: 96% 98% 96% 97%  Weight:      Height:        Intake/Output Summary (Last 24 hours) at 03/22/2023 1256 Last data filed at 03/22/2023 1000 Gross per 24 hour  Intake 118 ml  Output 1 ml  Net 117 ml   Filed Weights   03/21/23 1255 03/21/23 1840  Weight: 87 kg 84.6 kg     Exam General: Alert and oriented x 3, NAD Cardiovascular: S1 S2 auscultated, no murmurs, irregularly irregular.  Respiratory: Clear to auscultation bilaterally, no wheezing, rales or rhonchi Gastrointestinal: Soft, nontender, nondistended, + bowel sounds Ext:  foot in cast on the right.  Neuro: AAOx3, bilaterally Skin: No rashes Psych: Normal affect and demeanor,   Data Reviewed:  I have personally reviewed following labs and imaging studies   CBC Lab Results  Component Value Date   WBC 8.2 03/22/2023   RBC 4.63 03/22/2023   HGB 13.8 03/22/2023   HCT 42.3 03/22/2023   MCV 91.4 03/22/2023   MCH 29.8 03/22/2023   PLT 131 (L) 03/22/2023   MCHC 32.6 03/22/2023   RDW 14.3 03/22/2023   LYMPHSABS 0.8 03/21/2023   MONOABS 1.3 (H) 03/21/2023   EOSABS 0.0 03/21/2023   BASOSABS 0.0 03/21/2023     Last metabolic panel Lab Results  Component Value Date   NA 134 (L) 03/22/2023   K 4.0 03/22/2023   CL 102 03/22/2023   CO2 22 03/22/2023   BUN 28 (H) 03/22/2023   CREATININE 1.14 03/22/2023   GLUCOSE 155 (H) 03/22/2023   GFRNONAA >60 03/22/2023   GFRAA >60 03/18/2017   CALCIUM 8.6 (L) 03/22/2023   PROT 7.2 03/21/2023   ALBUMIN 3.7 03/21/2023   BILITOT 0.8 03/21/2023   ALKPHOS 62 03/21/2023    AST 43 (H) 03/21/2023   ALT 17 03/21/2023   ANIONGAP 10 03/22/2023    CBG (last 3)  Recent  Labs    03/21/23 2039 03/22/23 0731 03/22/23 1216  GLUCAP 141* 139* 184*      Coagulation Profile: Recent Labs  Lab 03/21/23 1405  INR 2.0*     Radiology Studies: CT Head Wo Contrast Result Date: 03/21/2023 CLINICAL DATA:  Head trauma. EXAM: CT HEAD WITHOUT CONTRAST TECHNIQUE: Contiguous axial images were obtained from the base of the skull through the vertex without intravenous contrast. RADIATION DOSE REDUCTION: This exam was performed according to the departmental dose-optimization program which includes automated exposure control, adjustment of the mA and/or kV according to patient size and/or use of iterative reconstruction technique. COMPARISON:  Head CT dated 03/19/2017. FINDINGS: Brain: The ventricles and sulci are appropriate size for the patient's age. The gray-white matter discrimination is preserved. There is no acute intracranial hemorrhage. No mass effect or midline shift. No extra-axial fluid collection. Vascular: No hyperdense vessel or unexpected calcification. Skull: Normal. Negative for fracture or focal lesion. Sinuses/Orbits: No acute finding. Other: None IMPRESSION: No acute intracranial pathology. Electronically Signed   By: Elgie Collard M.D.   On: 03/21/2023 16:41   DG Foot Complete Right Result Date: 03/21/2023 CLINICAL DATA:  Questionable sepsis-evaluate for abnormality. History of foot infection with recent antibiotics. EXAM: RIGHT FOOT COMPLETE - 3+ VIEW COMPARISON:  None Available. 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 No evidence of acute fracture, dislocation or recurrent osteomyelitis. Mild midfoot degenerative changes. There are bidirectional calcaneal spurs. Soft tissue evaluation limited by overlying bandages. No foreign body or definite soft tissue emphysema  identified. IMPRESSION: Postsurgical changes consistent with previous transmetatarsal amputation. No acute osseous findings or definite evidence of recurrent osteomyelitis. Electronically Signed   By: Carey Bullocks M.D.   On: 03/21/2023 14:34   DG Chest Port 1 View Result Date: 03/21/2023 CLINICAL DATA:  Questionable sepsis. Altered mental status. Nausea. EXAM: PORTABLE CHEST 1 VIEW COMPARISON:  Radiographs 08/09/2021 and 03/18/2017. FINDINGS: 1357 hours. Left subclavian AICD leads appear unchanged. The heart size and mediastinal contours are stable with aortic atherosclerosis and a left atrial appendage clip. The lungs are clear. There is no pleural effusion or pneumothorax. No acute osseous findings are evident. Telemetry leads overlie the chest. IMPRESSION: Stable postoperative chest. No evidence of active cardiopulmonary process. Electronically Signed   By: Carey Bullocks M.D.   On: 03/21/2023 14:32       Kathlen Mody M.D. [facility] 03/22/2023, 12:56 PM  Available via Epic secure chat 7am-7pm After 7 pm, please refer to night coverage provider listed on amion.

## 2023-03-22 NOTE — Plan of Care (Signed)

## 2023-03-22 NOTE — Progress Notes (Signed)
Orthopedic Tech Progress Note Patient Details:  [name] 1945/10/20 [id number]  RN called requesting to have TCC removed from patient. I did let her know I could remove it but the order had stated for tomorrow morning at 03/23/23 @ 0500, and that [employer] aren't here at that hour. If I did remove it ,I could not reapply a TCC so what would they (MD) like for me to reapply once off?  Patient ID: [name], male   DOB: 31-Jan-1946, 78 y.o.   MRN: [id number]  [name] 03/22/2023, 4:52 PM

## 2023-03-22 NOTE — Plan of Care (Signed)
  Problem: Coping: Goal: Ability to adjust to condition or change in health will improve Outcome: Progressing   Problem: Health Behavior/Discharge Planning: Goal: Ability to identify and utilize available resources and services will improve Outcome: Progressing   Problem: Metabolic: Goal: Ability to maintain appropriate glucose levels will improve Outcome: Progressing   Problem: Tissue Perfusion: Goal: Adequacy of tissue perfusion will improve Outcome: Progressing   Problem: Clinical Measurements: Goal: Ability to maintain clinical measurements within normal limits will improve Outcome: Progressing   Problem: Nutrition: Goal: Adequate nutrition will be maintained Outcome: Progressing   Problem: Pain Managment: Goal: General experience of comfort will improve and/or be controlled Outcome: Progressing   Problem: Safety: Goal: Ability to remain free from injury will improve Outcome: Progressing

## 2023-03-22 NOTE — Progress Notes (Signed)
MEWS Progress Note  Patient Details Name: [name] MRN: [id number] DOB: [birth year] Today's Date: 03/22/2023   MEWS Flowsheet Documentation:  Assess: MEWS Score Temp: 98 F (36.7 C) BP: 124/84 MAP (mmHg): 95 Pulse Rate: 88 ECG Heart Rate: 75 Resp: 18 Level of Consciousness: Alert SpO2: 97 % O2 Device: Room Air Assess: MEWS Score MEWS Temp: 0 MEWS Systolic: 0 MEWS Pulse: 0 MEWS RR: 0 MEWS LOC: 0 MEWS Score: 0 MEWS Score Color: Green Assess: SIRS CRITERIA SIRS Temperature : 0 SIRS Respirations : 0 SIRS Pulse: 0 SIRS WBC: 0 SIRS Score Sum : 0 SIRS Temperature : 0 SIRS Pulse: 0 SIRS Respirations : 0 SIRS WBC: 0 SIRS Score Sum : 0 Assess: if the MEWS score is Yellow or Red Were vital signs accurate and taken at a resting state?: Yes Does the patient meet 2 or more of the SIRS criteria?: No MEWS guidelines implemented : Yes, yellow Treat MEWS Interventions: Considered administering scheduled or prn medications/treatments as ordered Take Vital Signs Increase Vital Sign Frequency : Yellow: Q2hr x1, continue Q4hrs until patient remains green for 12hrs Escalate MEWS: Escalate: Yellow: Discuss with charge nurse and consider notifying provider and/or RRT        Drema Dallas 03/22/2023, 6:56 PM

## 2023-03-23 ENCOUNTER — Inpatient Hospital Stay (HOSPITAL_COMMUNITY): Payer: Medicare Other

## 2023-03-23 DIAGNOSIS — I255 Ischemic cardiomyopathy: Secondary | ICD-10-CM | POA: Diagnosis not present

## 2023-03-23 DIAGNOSIS — I951 Orthostatic hypotension: Secondary | ICD-10-CM | POA: Diagnosis not present

## 2023-03-23 DIAGNOSIS — I2510 Atherosclerotic heart disease of native coronary artery without angina pectoris: Secondary | ICD-10-CM | POA: Diagnosis not present

## 2023-03-23 DIAGNOSIS — T796XXD Traumatic ischemia of muscle, subsequent encounter: Secondary | ICD-10-CM | POA: Diagnosis not present

## 2023-03-23 DIAGNOSIS — R748 Abnormal levels of other serum enzymes: Secondary | ICD-10-CM | POA: Diagnosis not present

## 2023-03-23 DIAGNOSIS — I480 Paroxysmal atrial fibrillation: Secondary | ICD-10-CM | POA: Diagnosis not present

## 2023-03-23 LAB — CBC WITH DIFFERENTIAL/PLATELET
Abs Immature Granulocytes: 0.05 10*3/uL (ref 0.00–0.07)
Basophils Absolute: 0 10*3/uL (ref 0.0–0.1)
Basophils Relative: 0 %
Eosinophils Absolute: 0 10*3/uL (ref 0.0–0.5)
Eosinophils Relative: 0 %
HCT: 46.3 % (ref 39.0–52.0)
Hemoglobin: 15 g/dL (ref 13.0–17.0)
Immature Granulocytes: 1 %
Lymphocytes Relative: 6 %
Lymphs Abs: 0.6 10*3/uL — ABNORMAL LOW (ref 0.7–4.0)
MCH: 29.7 pg (ref 26.0–34.0)
MCHC: 32.4 g/dL (ref 30.0–36.0)
MCV: 91.7 fL (ref 80.0–100.0)
Monocytes Absolute: 1.2 10*3/uL — ABNORMAL HIGH (ref 0.1–1.0)
Monocytes Relative: 12 %
Neutro Abs: 8.1 10*3/uL — ABNORMAL HIGH (ref 1.7–7.7)
Neutrophils Relative %: 81 %
Platelets: 137 10*3/uL — ABNORMAL LOW (ref 150–400)
RBC: 5.05 MIL/uL (ref 4.22–5.81)
RDW: 14.4 % (ref 11.5–15.5)
WBC: 10 10*3/uL (ref 4.0–10.5)
nRBC: 0 % (ref 0.0–0.2)

## 2023-03-23 LAB — BLOOD GAS, VENOUS
Acid-Base Excess: 1.4 mmol/L (ref 0.0–2.0)
Bicarbonate: 24.9 mmol/L (ref 20.0–28.0)
Drawn by: 2136
O2 Saturation: 94.4 %
Patient temperature: 37
pCO2, Ven: 35 mm[Hg] — ABNORMAL LOW (ref 44–60)
pH, Ven: 7.46 — ABNORMAL HIGH (ref 7.25–7.43)
pO2, Ven: 65 mm[Hg] — ABNORMAL HIGH (ref 32–45)

## 2023-03-23 LAB — BLOOD GAS, ARTERIAL
Acid-base deficit: 1.1 mmol/L (ref 0.0–2.0)
Bicarbonate: 22.6 mmol/L (ref 20.0–28.0)
Drawn by: 25770
O2 Content: 6 L/min
O2 Saturation: 97.1 %
Patient temperature: 98.6
pCO2 arterial: <18 mm[Hg] — CL (ref 32–48)
pH, Arterial: >7.74 (ref 7.35–7.45)
pO2, Arterial: <31 mm[Hg] — CL (ref 83–108)

## 2023-03-23 LAB — BASIC METABOLIC PANEL
Anion gap: 13 (ref 5–15)
Anion gap: 14 (ref 5–15)
BUN: 28 mg/dL — ABNORMAL HIGH (ref 8–23)
BUN: 28 mg/dL — ABNORMAL HIGH (ref 8–23)
CO2: 20 mmol/L — ABNORMAL LOW (ref 22–32)
CO2: 24 mmol/L (ref 22–32)
Calcium: 8.6 mg/dL — ABNORMAL LOW (ref 8.9–10.3)
Calcium: 8.2 mg/dL — ABNORMAL LOW (ref 8.9–10.3)
Chloride: 98 mmol/L (ref 98–111)
Chloride: 97 mmol/L — ABNORMAL LOW (ref 98–111)
Creatinine, Ser: 1.5 mg/dL — ABNORMAL HIGH (ref 0.61–1.24)
Creatinine, Ser: 1.34 mg/dL — ABNORMAL HIGH (ref 0.61–1.24)
GFR, Estimated: 48 mL/min — ABNORMAL LOW (ref >60)
GFR, Estimated: 55 mL/min — ABNORMAL LOW (ref >60)
Glucose, Bld: 193 mg/dL — ABNORMAL HIGH (ref 70–99)
Glucose, Bld: 120 mg/dL — ABNORMAL HIGH (ref 70–99)
Potassium: 5.9 mmol/L — ABNORMAL HIGH (ref 3.5–5.1)
Potassium: 3.7 mmol/L (ref 3.5–5.1)
Sodium: 131 mmol/L — ABNORMAL LOW (ref 135–145)
Sodium: 135 mmol/L (ref 135–145)

## 2023-03-23 LAB — GLUCOSE, CAPILLARY
Glucose-Capillary: 171 mg/dL — ABNORMAL HIGH (ref 70–99)
Glucose-Capillary: 175 mg/dL — ABNORMAL HIGH (ref 70–99)
Glucose-Capillary: 115 mg/dL — ABNORMAL HIGH (ref 70–99)
Glucose-Capillary: 156 mg/dL — ABNORMAL HIGH (ref 70–99)
Glucose-Capillary: 146 mg/dL — ABNORMAL HIGH (ref 70–99)

## 2023-03-23 LAB — CBC
HCT: 41.6 % (ref 39.0–52.0)
Hemoglobin: 13.7 g/dL (ref 13.0–17.0)
MCH: 29.7 pg (ref 26.0–34.0)
MCHC: 32.9 g/dL (ref 30.0–36.0)
MCV: 90 fL (ref 80.0–100.0)
Platelets: 136 10*3/uL — ABNORMAL LOW (ref 150–400)
RBC: 4.62 MIL/uL (ref 4.22–5.81)
RDW: 14.4 % (ref 11.5–15.5)
WBC: 11.5 10*3/uL — ABNORMAL HIGH (ref 4.0–10.5)
nRBC: 0 % (ref 0.0–0.2)

## 2023-03-23 LAB — COMPREHENSIVE METABOLIC PANEL
ALT: 130 U/L — ABNORMAL HIGH (ref 0–44)
AST: 188 U/L — ABNORMAL HIGH (ref 15–41)
Albumin: 3.3 g/dL — ABNORMAL LOW (ref 3.5–5.0)
Alkaline Phosphatase: 91 U/L (ref 38–126)
Anion gap: 15 (ref 5–15)
BUN: 35 mg/dL — ABNORMAL HIGH (ref 8–23)
CO2: 21 mmol/L — ABNORMAL LOW (ref 22–32)
Calcium: 7.9 mg/dL — ABNORMAL LOW (ref 8.9–10.3)
Chloride: 92 mmol/L — ABNORMAL LOW (ref 98–111)
Creatinine, Ser: 1.48 mg/dL — ABNORMAL HIGH (ref 0.61–1.24)
GFR, Estimated: 49 mL/min — ABNORMAL LOW (ref >60)
Glucose, Bld: 153 mg/dL — ABNORMAL HIGH (ref 70–99)
Potassium: 3.5 mmol/L (ref 3.5–5.1)
Sodium: 128 mmol/L — ABNORMAL LOW (ref 135–145)
Total Bilirubin: 1.3 mg/dL — ABNORMAL HIGH (ref 0.0–1.2)
Total Protein: 6.9 g/dL (ref 6.5–8.1)

## 2023-03-23 LAB — SARS CORONAVIRUS 2 BY RT PCR: SARS Coronavirus 2 by RT PCR: NEGATIVE

## 2023-03-23 LAB — MRSA NEXT GEN BY PCR, NASAL: MRSA by PCR Next Gen: NOT DETECTED

## 2023-03-23 LAB — LACTIC ACID, PLASMA
Lactic Acid, Venous: 1.9 mmol/L (ref 0.5–1.9)
Lactic Acid, Venous: 1.8 mmol/L (ref 0.5–1.9)

## 2023-03-23 LAB — TROPONIN I (HIGH SENSITIVITY)
Troponin I (High Sensitivity): 31 ng/L — ABNORMAL HIGH (ref <18)
Troponin I (High Sensitivity): 23 ng/L — ABNORMAL HIGH (ref <18)

## 2023-03-23 LAB — CK: Total CK: 603 U/L — ABNORMAL HIGH (ref 49–397)

## 2023-03-23 MED ORDER — SODIUM ZIRCONIUM CYCLOSILICATE 5 G PO PACK
5.0000 g | PACK | Freq: Once | ORAL | Status: AC
  Administered 2023-03-23: 5 g via ORAL
  Filled 2023-03-23: qty 1

## 2023-03-23 MED ORDER — HYDROCOD POLI-CHLORPHE POLI ER 10-8 MG/5ML PO SUER
5.0000 mL | Freq: Once | ORAL | Status: AC
  Administered 2023-03-23: 5 mL via ORAL
  Filled 2023-03-23: qty 5

## 2023-03-23 MED ORDER — BENZONATATE 100 MG PO CAPS
200.0000 mg | ORAL_CAPSULE | Freq: Three times a day (TID) | ORAL | Status: DC | PRN
  Administered 2023-03-23: 200 mg via ORAL
  Filled 2023-03-23: qty 2

## 2023-03-23 MED ORDER — PHENOL 1.4 % MT LIQD
1.0000 | OROMUCOSAL | Status: DC | PRN
  Administered 2023-03-23: 1 via OROMUCOSAL
  Filled 2023-03-23: qty 177

## 2023-03-23 MED ORDER — METHYLPREDNISOLONE SODIUM SUCC 125 MG IJ SOLR
60.0000 mg | INTRAMUSCULAR | Status: AC
  Administered 2023-03-23: 60 mg via INTRAVENOUS
  Filled 2023-03-23: qty 2

## 2023-03-23 MED ORDER — IPRATROPIUM-ALBUTEROL 0.5-2.5 (3) MG/3ML IN SOLN
3.0000 mL | RESPIRATORY_TRACT | Status: DC | PRN
  Administered 2023-03-23 – 2023-03-25 (×3): 3 mL via RESPIRATORY_TRACT
  Filled 2023-03-23 (×2): qty 3

## 2023-03-23 MED ORDER — FUROSEMIDE 10 MG/ML IJ SOLN
20.0000 mg | Freq: Once | INTRAMUSCULAR | Status: AC
  Administered 2023-03-23: 20 mg via INTRAVENOUS
  Filled 2023-03-23: qty 2

## 2023-03-23 MED ORDER — DOFETILIDE 250 MCG PO CAPS: 250.0000 ug | ORAL_CAPSULE | Freq: Two times a day (BID) | ORAL | Status: DC

## 2023-03-23 MED ORDER — GUAIFENESIN-DM 100-10 MG/5ML PO SYRP
5.0000 mL | ORAL_SOLUTION | ORAL | Status: DC | PRN
  Administered 2023-03-23 – 2023-03-31 (×6): 5 mL via ORAL
  Filled 2023-03-23 (×2): qty 10
  Filled 2023-03-23: qty 5
  Filled 2023-03-23 (×2): qty 10
  Filled 2023-03-23: qty 5

## 2023-03-23 MED ORDER — METHYLPREDNISOLONE SODIUM SUCC 125 MG IJ SOLR
60.0000 mg | Freq: Every day | INTRAMUSCULAR | Status: DC
  Administered 2023-03-24 – 2023-03-25 (×2): 60 mg via INTRAVENOUS
  Filled 2023-03-23 (×2): qty 2

## 2023-03-23 NOTE — TOC Progression Note (Signed)
Transition of Care [facility]) - Progression Note    Patient Details  Name: [name] MRN: [id number] Date of Birth: Aug 13, 1945  Transition of Care [facility]) CM/SW Contact  Diona Browner, [place] Phone Number: 03/23/2023, 1:19 PM  Clinical Narrative:    Pt recommended for SNF for PT. PASRR created and referrals sent out to SNF for review.    Expected Discharge Plan: Skilled Nursing Facility Barriers to Discharge: SNF Pending bed offer, Continued Medical Work up  Expected Discharge Plan and Services In-house Referral: Clinical Social Work Discharge Planning Services: Other - See comment (SNF)   Living arrangements for the past 2 months: Single Family Home                   DME Agency: NA                   Social Determinants of Health (SDOH) Interventions SDOH Screenings   Food Insecurity: No Food Insecurity (03/21/2023)  Housing: Low Risk  (03/21/2023)  Transportation Needs: No Transportation Needs (03/21/2023)  Utilities: Not At Risk (03/21/2023)  Alcohol Screen: Low Risk  (03/18/2022)  Depression (PHQ2-9): Low Risk  (08/24/2022)  Financial Resource Strain: Low Risk  (03/18/2022)  Physical Activity: Inactive (03/18/2022)  Social Connections: Socially Isolated (03/21/2023)  Stress: No Stress Concern Present (03/18/2022)  Tobacco Use: Low Risk  (03/21/2023)    Readmission Risk Interventions    03/23/2023    1:14 PM  Readmission Risk Prevention Plan  Transportation Screening Complete  PCP or Specialist Appt within 5-7 Days Complete  Home Care Screening Complete  Medication Review (RN CM) Complete

## 2023-03-23 NOTE — Progress Notes (Signed)
12:30pm-3pm: Agree with previous RN assessment findings. Patient alert and pleasant when awake. Has napped intermittently. Wife at bedside and is pleased with care. Haydee Salter, RN 03/23/23 2:51 PM

## 2023-03-23 NOTE — Progress Notes (Addendum)
Per Pharmacy regarding QT prolonging meds and med reconciliation:  Pt for some reason is on two SSRI and has been getting them filled regularly. They are by different prescribers. Is on fluoxetine and citalopram.  Would stop citalopram and only keep fluoxetine as citalopram (esp at the higher 40 mg dose pt is on) increases QTc prologation risk.  Consider changing Crestor to Lipitor as there is some evidence that Crestor does increase the risk but Lipitor does not.  Was on levofloxacin PTA which is one of the major medications that can cause QTc issues. Would tell pt to make sure to not take levoflox on discharge I have discontinued his Zofran, he got an IV dose earlier this morning.   Repeat EKG in the morning Stopping Tikosyn for now in the setting of hyperkalemia and rhabdo  Dr. Eden Emms to provide final recommendations and official note.

## 2023-03-23 NOTE — Progress Notes (Signed)
    Patient Name: JEOVANNY KELDERMAN           DOB: 1945/12/20  MRN: [id number]      Admission Date: 03/21/2023  Attending Provider: Kathlen Mody, MD  Primary Diagnosis: Rhabdomyolysis   Level of care: Telemetry    CROSS COVER NOTE   Date of Service   03/23/2023   ANTONIUS STRASBURGER, 78 y.o. male, was admitted on 03/21/2023 for Rhabdomyolysis.    HPI/Events of Note   Dyspnea at rest Hemodynamically stable.  No hypoxia on 2 L [place]. Mainly c/o SOB and persistent wet cough. Increase SOB after completion of IV fluids. Not much improvement after DuoNeb breathing treatment. History of severe ischemic cardiomyopathy with EF of 20%.  Diuretics held due to hydration for rhabdo.  Plan: adding on Tussionex for cough, chest xray, EKG.     Bedside Assessment:  Patient is drowsy, he received Ativan and Ambien earlier tonight.  Currently oriented x 3.   When awake, patient looks uncomfortable.   Respiratory: Bilaterally wheezing, and crackles.  Tachypneic.  Mild accessory muscle use.  Cardiovascular: Regular rate and irregularly irregular rhythm. No extremity edema. 2+ pedal pulses.  Abdomen: Abdomen is soft and nontender.  Positive bowel sounds in all quadrants.    Addendum: Chest x-ray-cardiomegaly with pulmonary edema.  Adding on low-dose Lasix.  If breathing starts to worsen, consider BiPAP and/morphine for air hunger.   Interventions/ Plan   Chest x-ray EKG Tussionex IV Lasix, 20 mg        Anthoney Harada, DNP, [facility] [place]

## 2023-03-23 NOTE — Progress Notes (Signed)
Orthopedic Tech Progress Note Patient Details:  [name] 24-May-1945 [id number]  Patient ID: [name], male   DOB: 01-03-1946, 78 y.o.   MRN: [id number]  [name] 03/23/2023, 10:44 AM TCC rermoved per dr order

## 2023-03-23 NOTE — Progress Notes (Signed)
ABG sent to lab, Victorino Dike made aware at this time

## 2023-03-23 NOTE — Progress Notes (Addendum)
[facility]                                                                               [name], is a 78 y.o. male, DOB - Jun 15, 1945, VHQ:[id number] Admit date - 03/21/2023    Outpatient Primary MD for the patient is Corwin Levins, MD  LOS - 1  days    Brief summary    [name] is a 78 y.o. male with medical history significant for insulin-dependent type 2 diabetes, diabetic peripheral neuropathy, hyperlipidemia, persistent atrial fibrillation on anticoagulation, ischemic cardiomyopathy EF 20% currently being treated for right lower extremity diabetic wound and is being admitted to the hospital with rhabdomyolysis after a fall at home today.   He has been followed by Dr. Lady Gary for a right lower extremity diabetic wound, recent outpatient cultures grew Proteus and he has been placed on oral Levaquin.  Workup in the emergency department is actually quite benign, though he is significantly hypotensive, and has elevated CK.  He was admitted for hypotension and rhabdomyolysis.    Assessment & Plan    Assessment and Plan:    Rhabdomyolysis:  Improving slowly with IV fluids.   In view of h/o severe ischemic cardiomyopathy with EF of 20%, we stopped the IV fluids.  Will monitor CK levels off fluids.  CK levels today 603   Orthostatic hypotension:  Elevated lactic acid/ lactic acidosis resolved.    Generalized Weakness and falls suspect this may be due to overdiuresis due to some medication errors as described by his daughter (sometimes accidentally takes double doses of medications) and resulting dehydration with hypotension.  Therapy evaluations ordered and pending.      Diabetic foot wound X-ray today without evidence of osteomyelitis or other complication.  Follows up with Dr Lady Gary as outpatient. Will probably narrow down antibiotics in am , if he continues to improve.  Wound care on board.   Insulin Dependent Type 2 DM CBG (last 3)  Recent  Labs    03/23/23 0346 03/23/23 0754 03/23/23 1210  GLUCAP 171* 175* 115*   Resume SSI.  A1c is 7.2%.     Hyperlipidemia Holding crestor.    Atrial fibrillation Rate controlled. With metoprolol. Continue with xarelto.  Tikosyn held for hyperkalemia, rhabdomyolysis.  Cardiology on board and appreciate recommendations.    Hyperkalemia Resolved.    Anxiety  Resume ativan.     Acute respiratory failure with hypoxia req upto 2 lit of [place] oxygen.  - sec to pulmonary edema.  - received a dose of IV lasix 20 mg earlier this am followed by another 20 mg .  - Diuresed about 2 lit today. - repeat BMP shows improvement in renal parameters and potassium level.  - recheck CXR in am for resolution of the pulm edema.    Hyponatremia Resolved.    Mild thrombocytopenia Monitor.     Estimated body mass index is 26.76 kg/m as calculated from the following:   Height as of this encounter: 5\' 10"  (1.778 m).   Weight as of this encounter: 84.6 kg.  Code Status: full code. DVT Prophylaxis:  SCDs Start: 03/21/23 1728 rivaroxaban (XARELTO) tablet 20 mg  Level of Care: Level of care: Telemetry Family Communication: none at bedside.   Disposition Plan:     Remains inpatient appropriate:  pending clinical improvement.   Procedures:  None.   Consultants:   Cardiology.  Antimicrobials:   Anti-infectives (From admission, onward)    Start     Dose/Rate Route Frequency Ordered Stop   03/22/23 0500  ceFEPIme (MAXIPIME) 2 g in sodium chloride 0.9 % 100 mL IVPB        2 g 200 mL/hr over 30 Minutes Intravenous Every 12 hours 03/21/23 1811     03/21/23 1345  ceFEPIme (MAXIPIME) 2 g in sodium chloride 0.9 % 100 mL IVPB        2 g 200 mL/hr over 30 Minutes Intravenous  Once 03/21/23 1338 03/21/23 1500        Medications  Scheduled Meds:  aspirin EC  81 mg Oral Daily   empagliflozin  25 mg Oral Daily   fluticasone  2 spray Each Nare Daily   insulin aspart  0-15 Units  Subcutaneous TID WC   insulin aspart  0-5 Units Subcutaneous QHS   insulin glargine-yfgn  14 Units Subcutaneous Daily   loratadine  10 mg Oral Daily   LORazepam  0.5 mg Oral QHS   metoprolol  200 mg Oral QPC supper   rivaroxaban  20 mg Oral QPC supper   Continuous Infusions:  ceFEPime (MAXIPIME) IV 2 g (03/23/23 0823)   PRN Meds:.acetaminophen **OR** acetaminophen, guaiFENesin-dextromethorphan, ipratropium-albuterol, LORazepam, phenol, zolpidem    Subjective:   [name] was seen and examined today. Reports feeling much better. No chest pain , cough is improving. Sob is better.   Objective:   Vitals:   03/22/23 2209 03/23/23 0200 03/23/23 0400 03/23/23 1316  BP: 110/71 127/86 126/78 109/77  Pulse: 75 85 77 77  Resp:    16  Temp: (!) 97.4 F (36.3 C) 97.9 F (36.6 C) 97.6 F (36.4 C) 97.7 F (36.5 C)  TempSrc: Oral Oral Oral   SpO2: 94%  99% 100%  Weight:      Height:        Intake/Output Summary (Last 24 hours) at 03/23/2023 1453 Last data filed at 03/23/2023 1407 Gross per 24 hour  Intake 738.67 ml  Output 2801 ml  Net -2062.33 ml   Filed Weights   03/21/23 1255 03/21/23 1840  Weight: 87 kg 84.6 kg     Exam General exam: Appears calm and comfortable  Respiratory system: scattered rales posteriorly. Air entry fair on 2 lit of [place].  Cardiovascular system: S1 & S2 heard, RRR.  Gastrointestinal system: Abdomen is nondistended, soft and nontender. Central nervous system: Alert and oriented.  Extremities: right foot in cast Skin: No rashes,  Psychiatry: Mood & affect appropriate.     Data Reviewed:  I have personally reviewed following labs and imaging studies   CBC Lab Results  Component Value Date   WBC 10.0 03/23/2023   RBC 5.05 03/23/2023   HGB 15.0 03/23/2023   HCT 46.3 03/23/2023   MCV 91.7 03/23/2023   MCH 29.7 03/23/2023   PLT 137 (L) 03/23/2023   MCHC 32.4 03/23/2023   RDW 14.4 03/23/2023   LYMPHSABS 0.6 (L) 03/23/2023   MONOABS  1.2 (H) 03/23/2023   EOSABS 0.0 03/23/2023   BASOSABS 0.0 03/23/2023     Last metabolic panel Lab Results  Component Value Date   NA 135 03/23/2023   K 3.7 03/23/2023   CL 97 (L) 03/23/2023   CO2  24 03/23/2023   BUN 28 (H) 03/23/2023   CREATININE 1.34 (H) 03/23/2023   GLUCOSE 120 (H) 03/23/2023   GFRNONAA 55 (L) 03/23/2023   GFRAA >60 03/18/2017   CALCIUM 8.2 (L) 03/23/2023   PROT 7.2 03/21/2023   ALBUMIN 3.7 03/21/2023   BILITOT 0.8 03/21/2023   ALKPHOS 62 03/21/2023   AST 43 (H) 03/21/2023   ALT 17 03/21/2023   ANIONGAP 14 03/23/2023    CBG (last 3)  Recent Labs    03/23/23 0346 03/23/23 0754 03/23/23 1210  GLUCAP 171* 175* 115*      Coagulation Profile: Recent Labs  Lab 03/21/23 1405  INR 2.0*     Radiology Studies: [facility] Chest Port 1 View Result Date: 03/23/2023 CLINICAL DATA:  Increasing shortness of breath EXAM: PORTABLE CHEST 1 VIEW COMPARISON:  03/21/2023 FINDINGS: Stable cardiomegaly. Left atrial appendage occlusion device. Left chest wall CRT-D. Hazy airspace opacities in a perihilar and lower lung distribution. No definite pleural effusion. No pneumothorax. IMPRESSION: Cardiomegaly with pulmonary edema. Electronically Signed   By: Minerva Fester M.D.   On: 03/23/2023 03:53   CT Head Wo Contrast Result Date: 03/21/2023 CLINICAL DATA:  Head trauma. EXAM: CT HEAD WITHOUT CONTRAST TECHNIQUE: Contiguous axial images were obtained from the base of the skull through the vertex without intravenous contrast. RADIATION DOSE REDUCTION: This exam was performed according to the departmental dose-optimization program which includes automated exposure control, adjustment of the mA and/or kV according to patient size and/or use of iterative reconstruction technique. COMPARISON:  Head CT dated 03/19/2017. FINDINGS: Brain: The ventricles and sulci are appropriate size for the patient's age. The gray-white matter discrimination is preserved. There is no acute intracranial  hemorrhage. No mass effect or midline shift. No extra-axial fluid collection. Vascular: No hyperdense vessel or unexpected calcification. Skull: Normal. Negative for fracture or focal lesion. Sinuses/Orbits: No acute finding. Other: None IMPRESSION: No acute intracranial pathology. Electronically Signed   By: Elgie Collard M.D.   On: 03/21/2023 16:41       Kathlen Mody M.D. [facility] 03/23/2023, 2:53 PM  Available via Epic secure chat 7am-7pm After 7 pm, please refer to night coverage provider listed on amion.   Addendum:  Notified by RN patient had some wheezing and sob.  He is on 2 lit of [place] Oxygen , with sats around 99%.  Rest of the vitals wnl.    Please obtain a CXR, ABG, order IV solumedrol 60 mg once, and transfer the patient to progressive bed.    Kathlen Mody MD

## 2023-03-23 NOTE — Plan of Care (Signed)
  Problem: Coping: Goal: Ability to adjust to condition or change in health will improve Outcome: Progressing   Problem: Skin Integrity: Goal: Risk for impaired skin integrity will decrease Outcome: Progressing   Problem: Clinical Measurements: Goal: Respiratory complications will improve Outcome: Progressing   Problem: Coping: Goal: Level of anxiety will decrease Outcome: Progressing

## 2023-03-23 NOTE — Evaluation (Signed)
Physical Therapy Evaluation Patient Details Name: [name] MRN: [id number] DOB: November 24, 1945 Today's Date: 03/23/2023  History of Present Illness  78 y.o. male admitted with rhabdomyolysis after a fall at home. Pt with medical history significant for insulin-dependent type 2 diabetes, diabetic peripheral neuropathy, hyperlipidemia, persistent atrial fibrillation on anticoagulation, ischemic cardiomyopathy EF 20% currently being treated for right lower extremity diabetic wound, R transmetatarsal amputation 2021.  Clinical Impression  Pt admitted with above diagnosis. Min assist for bed mobility and to stand pivot to recliner with RW and encouragement to minimize weight bearing to RLE. Pt had a cast removed from RLE this morning, it had been placed 03/20/23 to promote healing of R foot wound. Awaiting weight bearing status clarification, so ambulation was deferred. At baseline pt ambulates without an assistive device, his family (daughter and ex-wife) report he's had 3 falls in the past 6 months, pt lives alone. Patient will benefit from continued inpatient follow up therapy, <3 hours/day.  Pt currently with functional limitations due to the deficits listed below (see PT Problem List). Pt will benefit from acute skilled PT to increase their independence and safety with mobility to allow discharge.           If plan is discharge home, recommend the following: A little help with walking and/or transfers;A little help with bathing/dressing/bathroom;Assistance with cooking/housework;Assist for transportation;Help with stairs or ramp for entrance   Can travel by private vehicle   Yes    Equipment Recommendations None recommended by PT  Recommendations for Other Services       Functional Status Assessment Patient has had a recent decline in their functional status and demonstrates the ability to make significant improvements in function in a reasonable and predictable amount of time.      Precautions / Restrictions Precautions Precautions: Fall Precaution Comments: 3 falls in past 6 months per daughter Jae Dire Restrictions [employer] Bearing Restrictions Per Provider Order: No      Mobility  Bed Mobility Overal bed mobility: Needs Assistance Bed Mobility: Supine to Sit     Supine to sit: Min assist     General bed mobility comments: assist to raise trunk    Transfers Overall transfer level: Needs assistance Equipment used: Rolling walker (2 wheels) Transfers: Sit to/from Stand, Bed to chair/wheelchair/BSC Sit to Stand: Min assist, From elevated surface Stand pivot transfers: Contact guard assist         General transfer comment: VCs hand placement and to minimize WBing RLE until WB status is clarified (pt noted to still be weightbearing on RLE)    Ambulation/Gait                  Stairs            Wheelchair Mobility     Tilt Bed    Modified Rankin (Stroke Patients Only)       Balance                                             Pertinent Vitals/Pain Pain Assessment Pain Assessment: 0-10 Breathing: normal Negative Vocalization: none Facial Expression: smiling or inexpressive Body Language: relaxed Consolability: no need to console PAINAD Score: 0 Pain Location: throat Pain Descriptors / Indicators: Sore Pain Intervention(s): Limited activity within patient's tolerance, Monitored during session    Home Living Family/patient expects to be discharged to:: Private residence Living Arrangements: Alone  Type of Home: House Home Access: Stairs to enter   [employer] of Steps: 3   Home Layout: One level Home Equipment: BSC/3in1;Rolling Walker (2 wheels);Shower seat;Toilet riser Additional Comments: lives alone, daughter Jae Dire goes to MD appointments with him; rail being installed for stairs; famiy not available to assist    Prior Function Prior Level of Function : Independent/Modified  Independent;Driving;History of Falls (last six months)             Mobility Comments: had healing cast and boot placed 03/20/23, hadn't really walked much with it bc he fell that day (was lightheaded); prior to cast placement pt walked without AD, had healing shoe prior to cast. Several falls in past 6 months. ADLs Comments: independent     Extremity/Trunk Assessment   Upper Extremity Assessment Upper Extremity Assessment: Defer to OT evaluation (pt reports some trouble with grip)    Lower Extremity Assessment Lower Extremity Assessment: Generalized weakness;RLE deficits/detail;LLE deficits/detail RLE Deficits / Details: cast removed this morning, bandage covering wound on bottom of foot, h/o R transmet amputation; knee ext +4/5 RLE Sensation: decreased light touch;history of peripheral neuropathy LLE Deficits / Details: knee ext +4/5 LLE Sensation: decreased light touch;history of peripheral neuropathy    Cervical / Trunk Assessment Cervical / Trunk Assessment: Normal  Communication   Communication Communication: Hearing impairment  Cognition Arousal: Alert Behavior During Therapy: WFL for tasks assessed/performed Overall Cognitive Status: Within Functional Limits for tasks assessed                                          General Comments      Exercises     Assessment/Plan    PT Assessment Patient needs continued PT services  PT Problem List Decreased strength;Decreased activity tolerance;Decreased balance;Decreased mobility       PT Treatment Interventions DME instruction;Gait training;Therapeutic exercise;Therapeutic activities    PT Goals (Current goals can be found in the Care Plan section)  Acute Rehab PT Goals Patient Stated Goal: hopes to DC home PT Goal Formulation: With patient Time For Goal Achievement: 04/06/23 Potential to Achieve Goals: Good    Frequency Min 1X/week     Co-evaluation               AM-PAC PT "6  Clicks" Mobility  Outcome Measure Help needed turning from your back to your side while in a flat bed without using bedrails?: None Help needed moving from lying on your back to sitting on the side of a flat bed without using bedrails?: A Little Help needed moving to and from a bed to a chair (including a wheelchair)?: A Little Help needed standing up from a chair using your arms (e.g., wheelchair or bedside chair)?: A Little Help needed to walk in hospital room?: A Lot Help needed climbing 3-5 steps with a railing? : A Lot 6 Click Score: 17    End of Session Equipment Utilized During Treatment: Gait belt Activity Tolerance: Patient tolerated treatment well Patient left: in chair;with call bell/phone within reach;with chair alarm set Nurse Communication: Mobility status;Weight bearing status (awaiting clarification on WB status RLE) PT Visit Diagnosis: History of falling (Z91.81);Difficulty in walking, not elsewhere classified (R26.2);Repeated falls (R29.6);Unsteadiness on feet (R26.81)    Time: 1610-9604 PT Time Calculation (min) (ACUTE ONLY): 31 min   Charges:   PT Evaluation $PT Eval Moderate Complexity: 1 Mod PT Treatments $Therapeutic Activity: 8-22  mins PT General Charges $$ ACUTE PT VISIT: 1 Visit         Tamala Ser PT 03/23/2023  Acute Rehabilitation Services  Office [phone]

## 2023-03-23 NOTE — [place] FL2 (Signed)
[place] MEDICAID FL2 LEVEL OF CARE FORM     IDENTIFICATION  Patient Name: [name] Birthdate: 07/14/1945 Sex: male Admission Date (Current Location): 03/21/2023  [facility] and [place] Number:  Producer, television/film/video and Address:  [facility],  [address], [place] [address]      Provider Number: [id number]  Attending Physician Name and Address:  Kathlen Mody, MD  Relative Name and Phone Number:  Dtr Rayon Rudkin [phone]    Current Level of Care: Hospital Recommended Level of Care: Skilled Nursing Facility Prior Approval Number:    Date Approved/Denied:   PASRR Number: [id number]  Discharge Plan: SNF    Current Diagnoses: Patient Active Problem List   Diagnosis Date Noted   Orthostatic hypotension 03/22/2023   Rhabdomyolysis 03/21/2023   Vitamin D deficiency 08/27/2022   Skin lesion 08/09/2021   Encounter for well adult exam with abnormal findings 08/09/2021   Head and neck lymphadenopathy 08/09/2021   Pharyngitis 08/09/2021   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) 02/11/2020   PVD (peripheral vascular disease) (HCC) 02/11/2020   Drug-induced gynecomastia 03/26/2019   Painful breasts 03/26/2019   Anxiety 03/22/2017   Back pain 10/11/2016   Acute bronchitis 12/05/2014   Allergic rhinitis 02/04/2014   Insomnia 02/04/2014   Status post ligation of left atrial appendage 12/20/2012   HTN (hypertension) 12/18/2012   Ischemic cardiomyopathy 12/18/2012   Syncope and collapse 05/15/2012   Nephrolithiasis 04/19/2012   Depression/anxiety 03/09/2012   Ventricular fibrillation (HCC) 03/09/2012   Left facial numbness 12/23/2010   Long term current use of anticoagulant therapy 04/25/2010   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/2009   CALLUS, RIGHT FOOT 12/08/2009   Type II diabetes mellitus with neurological manifestations (HCC) 12/08/2009   SYSTOLIC HEART FAILURE, CHRONIC 11/03/2009   CATARACT EXTRACTION, HX OF 09/22/2008    Implantable cardioverter-defibrillator (ICD) in situ 09/22/2008   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   Poorly controlled type 2 diabetes mellitus with circulatory disorder (HCC) 12/05/2006   HYPOGONADISM, MALE 12/05/2006   Hyperlipidemia 11/25/2006   ISCHEMIC CARDIOMYOPATHY 11/25/2006   Atrial fibrillation (HCC) 11/25/2006   CAD (coronary artery disease) 11/03/2004    Orientation RESPIRATION BLADDER Height & Weight     Self, Time, Situation, Place  O2 Continent Weight: 186 lb 8.2 oz (84.6 kg) Height:  5\' 10"  (177.8 cm)  BEHAVIORAL SYMPTOMS/MOOD NEUROLOGICAL BOWEL NUTRITION STATUS      Continent  (Regular)  AMBULATORY STATUS COMMUNICATION OF NEEDS Skin   Limited Assist Verbally Normal                       Personal Care Assistance Level of Assistance  Bathing, Feeding, Dressing Bathing Assistance: Independent Feeding assistance: Independent Dressing Assistance: Limited assistance     Functional Limitations Info  Sight, Hearing, Speech Sight Info: Impaired Hearing Info: Impaired Speech Info: Adequate    SPECIAL CARE FACTORS FREQUENCY  PT (By licensed PT), OT (By licensed OT)     PT Frequency: 5x/wk OT Frequency: 5x/wk            Contractures Contractures Info: Not present    Additional Factors Info  Allergies   Allergies Info: Salmon (Fish Allergy)  Sulfa Antibiotics  Metformin And Related           Current Medications (03/23/2023):  This is the current hospital active medication list Current Facility-Administered Medications  Medication Dose Route Frequency Provider Last Rate Last Admin   acetaminophen (TYLENOL) tablet 650 mg  650 mg Oral Q6H PRN Kirby Crigler, Mir M, MD   650 mg at 03/22/23 1700   Or   acetaminophen (TYLENOL) suppository 650 mg  650 mg Rectal Q6H PRN Maryln Gottron, MD       aspirin EC tablet 81 mg  81 mg Oral Daily Kirby Crigler, Mir M, MD   81 mg at 03/23/23 0811   ceFEPIme (MAXIPIME) 2 g in sodium chloride 0.9 % 100  mL IVPB  2 g Intravenous Q12H Cindi Carbon, RPH 200 mL/hr at 03/23/23 1610 2 g at 03/23/23 9604   empagliflozin (JARDIANCE) tablet 25 mg  25 mg Oral Daily Kirby Crigler, Mir M, MD   25 mg at 03/23/23 0811   fluticasone (FLONASE) 50 MCG/ACT nasal spray 2 spray  2 spray Each Nare Daily Kirby Crigler, Mir M, MD   2 spray at 03/23/23 5409   guaiFENesin-dextromethorphan (ROBITUSSIN DM) 100-10 MG/5ML syrup 5 mL  5 mL Oral Q4H PRN Kathlen Mody, MD   5 mL at 03/23/23 0234   insulin aspart (novoLOG) injection 0-15 Units  0-15 Units Subcutaneous TID WC Maryln Gottron, MD   3 Units at 03/23/23 8119   insulin aspart (novoLOG) injection 0-5 Units  0-5 Units Subcutaneous QHS Kirby Crigler, Mir M, MD       insulin glargine-yfgn [facility]) injection 14 Units  14 Units Subcutaneous Daily Kirby Crigler, Mir M, MD   14 Units at 03/23/23 0811   ipratropium-albuterol (DUONEB) 0.5-2.5 (3) MG/3ML nebulizer solution 3 mL  3 mL Nebulization Q4H PRN Anthoney Harada, NP   3 mL at 03/23/23 0214   loratadine (CLARITIN) tablet 10 mg  10 mg Oral Daily Kirby Crigler, Mir M, MD   10 mg at 03/23/23 1478   LORazepam (ATIVAN) tablet 0.5 mg  0.5 mg Oral BID PRN Kirby Crigler, Mir M, MD   0.5 mg at 03/23/23 2956   LORazepam (ATIVAN) tablet 0.5 mg  0.5 mg Oral QHS Kirby Crigler, Mir M, MD   0.5 mg at 03/22/23 2101   metoprolol succinate (TOPROL-XL) 24 hr tablet 200 mg  200 mg Oral QPC supper Kirby Crigler, Mir M, MD   200 mg at 03/22/23 1701   phenol (CHLORASEPTIC) mouth spray 1 spray  1 spray Mouth/Throat PRN Kathlen Mody, MD   1 spray at 03/23/23 1200   rivaroxaban (XARELTO) tablet 20 mg  20 mg Oral QPC supper Kirby Crigler, Mir M, MD   20 mg at 03/22/23 1701   zolpidem (AMBIEN) tablet 5-10 mg  5-10 mg Oral QHS PRN Anthoney Harada, NP   10 mg at 03/22/23 2101     Discharge Medications: Please see discharge summary for a list of discharge medications.  Relevant Imaging Results:  Relevant Lab Results:   Additional Information SSN  [id number]  Diona Browner, LCSW

## 2023-03-23 NOTE — Progress Notes (Addendum)
    Patient Name: [name]           DOB: 09-23-1945  MRN: [id number]      Admission Date: 03/21/2023  Attending Provider: Kathlen Mody, MD  Primary Diagnosis: Rhabdomyolysis   Level of care: Progressive    CROSS COVER NOTE   Date of Service   03/23/2023   [name], 78 y.o. male, was admitted on 03/21/2023 for Rhabdomyolysis.    HPI/Events of Note   Follow-up from respiratory event -   Chest x-ray -cardiomegaly, improving edema with slight residual edema. Blood gas-respiratory alkalosis, pH/pCO2/pO2-7.46/35/65.  Troponin 31 --> 23.  No chest discomfort.  Likely had some demand ischemia involved. COVID-negative.  Respiratory panel pending. EKG- AV paced, no changes from prior   Bedside Assessment:  Patient is awake, A/O x4, with no associated distress.  Seen laughing and talking with family at bedside. Patient reporting to feeling better after receiving neb treatment and Solu-Medrol.   Currently on room air.  BP 98/72 (81).    Respiratory: Bilaterally wheezing. Normal effort. No accessory muscle use.  Cardiovascular: Regular rate and irregularly irregular rhythm . No extremity edema. 2+ pedal pulses. Abdomen: Abdomen is soft and nontender.  Positive bowel sounds in all quadrants.   Plan:  Would likely benefit from additional Lasix, however BP is borderline. Hold off on Lasix for now seeing as patient feels better.  RN to continue monitoring.  Continue neb treatment as needed.  Current plan is to transfer patient to progressive unit once bed is available.   Addendum- positive resp panel for metapneumovirus    Interventions/ Plan   Above        Anthoney Harada, DNP, [facility]

## 2023-03-23 NOTE — Plan of Care (Signed)

## 2023-03-23 NOTE — Plan of Care (Signed)
  Problem: Health Behavior/Discharge Planning: Goal: Ability to identify and utilize available resources and services will improve Outcome: Progressing   Problem: Metabolic: Goal: Ability to maintain appropriate glucose levels will improve Outcome: Progressing   Problem: Nutritional: Goal: Maintenance of adequate nutrition will improve Outcome: Progressing   Problem: Clinical Measurements: Goal: Ability to maintain clinical measurements within normal limits will improve Outcome: Progressing   Problem: Pain Managment: Goal: General experience of comfort will improve and/or be controlled Outcome: Progressing   Problem: Safety: Goal: Ability to remain free from injury will improve Outcome: Progressing

## 2023-03-23 NOTE — Progress Notes (Signed)
Cardiology:  [name]  Subjective:  Denies SSCP, palpitations or Dyspnea Wants to go home and get cast off   Objective:  Vitals:   03/22/23 1701 03/22/23 2209 03/23/23 0200 03/23/23 0400  BP: 124/84 110/71 127/86 126/78  Pulse: 88 75 85 77  Resp:      Temp:  (!) 97.4 F (36.3 C) 97.9 F (36.6 C) 97.6 F (36.4 C)  TempSrc:  Oral Oral Oral  SpO2:  94%  99%  Weight:      Height:        Intake/Output from previous day:  Intake/Output Summary (Last 24 hours) at 03/23/2023 1031 Last data filed at 03/23/2023 0950 Gross per 24 hour  Intake 498.67 ml  Output 1801 ml  Net -1302.33 ml    Physical Exam: Chronically ill white male Dry JVP not elevated AICD under left clavicle Abdomen benign RLE in cast with infection  No edema  Lab Results: Basic Metabolic Panel: Recent Labs    03/22/23 0517 03/22/23 1117 03/23/23 0510  NA 134*  --  131*  K 4.0  --  5.9*  CL 102  --  98  CO2 22  --  20*  GLUCOSE 155*  --  193*  BUN 28*  --  28*  CREATININE 1.14  --  1.50*  CALCIUM 8.6*  --  8.6*  MG  --  2.1  --    Liver Function Tests: Recent Labs    03/21/23 1405  AST 43*  ALT 17  ALKPHOS 62  BILITOT 0.8  PROT 7.2  ALBUMIN 3.7   No results for input(s): "LIPASE", "AMYLASE" in the last 72 hours. CBC: Recent Labs    03/21/23 1405 03/22/23 0517 03/23/23 0510  WBC 7.2 8.2 10.0  NEUTROABS 5.0  --  8.1*  HGB 14.4 13.8 15.0  HCT 43.7 42.3 46.3  MCV 89.2 91.4 91.7  PLT 140* 131* 137*   Cardiac Enzymes: Recent Labs    03/21/23 1405 03/22/23 0517 03/23/23 0510  CKTOTAL 1,750* 1,151* 603*    Imaging: DG Chest Port 1 View Result Date: 03/23/2023 CLINICAL DATA:  Increasing shortness of breath EXAM: PORTABLE CHEST 1 VIEW COMPARISON:  03/21/2023 FINDINGS: Stable cardiomegaly. Left atrial appendage occlusion device. Left chest wall CRT-D. Hazy airspace opacities in a perihilar and lower lung distribution. No definite pleural effusion. No pneumothorax.  IMPRESSION: Cardiomegaly with pulmonary edema. Electronically Signed   By: Minerva Fester M.D.   On: 03/23/2023 03:53   CT Head Wo Contrast Result Date: 03/21/2023 CLINICAL DATA:  Head trauma. EXAM: CT HEAD WITHOUT CONTRAST TECHNIQUE: Contiguous axial images were obtained from the base of the skull through the vertex without intravenous contrast. RADIATION DOSE REDUCTION: This exam was performed according to the departmental dose-optimization program which includes automated exposure control, adjustment of the mA and/or kV according to patient size and/or use of iterative reconstruction technique. COMPARISON:  Head CT dated 03/19/2017. FINDINGS: Brain: The ventricles and sulci are appropriate size for the patient's age. The gray-white matter discrimination is preserved. There is no acute intracranial hemorrhage. No mass effect or midline shift. No extra-axial fluid collection. Vascular: No hyperdense vessel or unexpected calcification. Skull: Normal. Negative for fracture or focal lesion. Sinuses/Orbits: No acute finding. Other: None IMPRESSION: No acute intracranial pathology. Electronically Signed   By: Elgie Collard M.D.   On: 03/21/2023 16:41   DG Foot Complete Right Result Date: 03/21/2023 CLINICAL DATA:  Questionable sepsis-evaluate for abnormality. History of foot infection with recent antibiotics. EXAM:  RIGHT FOOT COMPLETE - 3+ VIEW COMPARISON:  None Available. 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 No evidence of acute fracture, dislocation or recurrent osteomyelitis. Mild midfoot degenerative changes. There are bidirectional calcaneal spurs. Soft tissue evaluation limited by overlying bandages. No foreign body or definite soft tissue emphysema identified. IMPRESSION: Postsurgical changes consistent with previous transmetatarsal amputation. No acute osseous findings or definite evidence of recurrent  osteomyelitis. Electronically Signed   By: Carey Bullocks M.D.   On: 03/21/2023 14:34   DG Chest Port 1 View Result Date: 03/21/2023 CLINICAL DATA:  Questionable sepsis. Altered mental status. Nausea. EXAM: PORTABLE CHEST 1 VIEW COMPARISON:  Radiographs 08/09/2021 and 03/18/2017. FINDINGS: 1357 hours. Left subclavian AICD leads appear unchanged. The heart size and mediastinal contours are stable with aortic atherosclerosis and a left atrial appendage clip. The lungs are clear. There is no pleural effusion or pneumothorax. No acute osseous findings are evident. Telemetry leads overlie the chest. IMPRESSION: Stable postoperative chest. No evidence of active cardiopulmonary process. Electronically Signed   By: Carey Bullocks M.D.   On: 03/21/2023 14:32    Cardiac Studies:  ECG: AV pacing 75    Telemetry: AV pacing  Echo:   Medications:    aspirin EC  81 mg Oral Daily   empagliflozin  25 mg Oral Daily   fluticasone  2 spray Each Nare Daily   insulin aspart  0-15 Units Subcutaneous TID WC   insulin aspart  0-5 Units Subcutaneous QHS   insulin glargine-yfgn  14 Units Subcutaneous Daily   loratadine  10 mg Oral Daily   LORazepam  0.5 mg Oral QHS   metoprolol  200 mg Oral QPC supper   rivaroxaban  20 mg Oral QPC supper      ceFEPime (MAXIPIME) IV 2 g (03/23/23 2952)    Assessment/Plan:  PAF:  no rapid rates on xarelto Prior convergent Rx at [facility] with LAA ? Percutaneous ligation.His corrected QTC for rate and paced QRS is only 480 msec. However I have concern that he got Zofran last night, Cr, K on rise in setting of rhabdo. Will hold Tikosyn today and consider adding back when these issues resolve. Discussed with nurse never to give Zofran CAD:  prior stenting of all 3 arteries no angina ASA and beta blocker Holding statin with rhabdo DCM:  EF 20% dehydrated holding home jardiaance, inspra, entresto , demadex Can reintroduce in 48 hours when rhabdo improved and not dehydrated.  DM:  per  primary service  Rhabdo:  Cr ok trend CPK's  ID:  RLE infection on Levaquin has been seeing wound center Transmetatarsal amputation Xray 1/21 no acute evidence of osteomyelitis  Primary service to address Cr/K Discussed cardiac issues with wife in room and daughter on phone  Charlton Haws 03/23/2023, 10:31 AM

## 2023-03-24 DIAGNOSIS — I255 Ischemic cardiomyopathy: Secondary | ICD-10-CM | POA: Diagnosis not present

## 2023-03-24 DIAGNOSIS — I2510 Atherosclerotic heart disease of native coronary artery without angina pectoris: Secondary | ICD-10-CM | POA: Diagnosis not present

## 2023-03-24 DIAGNOSIS — I480 Paroxysmal atrial fibrillation: Secondary | ICD-10-CM | POA: Diagnosis not present

## 2023-03-24 LAB — CBC WITH DIFFERENTIAL/PLATELET
Abs Immature Granulocytes: 0.05 10*3/uL (ref 0.00–0.07)
Basophils Absolute: 0 10*3/uL (ref 0.0–0.1)
Basophils Relative: 0 %
Eosinophils Absolute: 0 10*3/uL (ref 0.0–0.5)
Eosinophils Relative: 0 %
HCT: 41.2 % (ref 39.0–52.0)
Hemoglobin: 13.6 g/dL (ref 13.0–17.0)
Immature Granulocytes: 1 %
Lymphocytes Relative: 7 %
Lymphs Abs: 0.5 10*3/uL — ABNORMAL LOW (ref 0.7–4.0)
MCH: 29.6 pg (ref 26.0–34.0)
MCHC: 33 g/dL (ref 30.0–36.0)
MCV: 89.8 fL (ref 80.0–100.0)
Monocytes Absolute: 0.4 10*3/uL (ref 0.1–1.0)
Monocytes Relative: 5 %
Neutro Abs: 6.4 10*3/uL (ref 1.7–7.7)
Neutrophils Relative %: 87 %
Platelets: 119 10*3/uL — ABNORMAL LOW (ref 150–400)
RBC: 4.59 MIL/uL (ref 4.22–5.81)
RDW: 14.1 % (ref 11.5–15.5)
WBC: 7.4 10*3/uL (ref 4.0–10.5)
nRBC: 0 % (ref 0.0–0.2)

## 2023-03-24 LAB — BASIC METABOLIC PANEL
Anion gap: 12 (ref 5–15)
BUN: 32 mg/dL — ABNORMAL HIGH (ref 8–23)
CO2: 25 mmol/L (ref 22–32)
Calcium: 8.3 mg/dL — ABNORMAL LOW (ref 8.9–10.3)
Chloride: 96 mmol/L — ABNORMAL LOW (ref 98–111)
Creatinine, Ser: 1.21 mg/dL (ref 0.61–1.24)
GFR, Estimated: >60 mL/min (ref >60)
Glucose, Bld: 161 mg/dL — ABNORMAL HIGH (ref 70–99)
Potassium: 4 mmol/L (ref 3.5–5.1)
Sodium: 133 mmol/L — ABNORMAL LOW (ref 135–145)

## 2023-03-24 LAB — RESPIRATORY PANEL BY PCR

## 2023-03-24 LAB — BRAIN NATRIURETIC PEPTIDE: B Natriuretic Peptide: 771.7 pg/mL — ABNORMAL HIGH (ref 0.0–100.0)

## 2023-03-24 LAB — CK: Total CK: 334 U/L (ref 49–397)

## 2023-03-24 LAB — GLUCOSE, CAPILLARY
Glucose-Capillary: 166 mg/dL — ABNORMAL HIGH (ref 70–99)
Glucose-Capillary: 208 mg/dL — ABNORMAL HIGH (ref 70–99)
Glucose-Capillary: 209 mg/dL — ABNORMAL HIGH (ref 70–99)
Glucose-Capillary: 249 mg/dL — ABNORMAL HIGH (ref 70–99)

## 2023-03-24 MED ORDER — DOFETILIDE 250 MCG PO CAPS
250.0000 ug | ORAL_CAPSULE | Freq: Two times a day (BID) | ORAL | Status: DC
  Administered 2023-03-24: 250 ug via ORAL
  Filled 2023-03-24 (×6): qty 1

## 2023-03-24 NOTE — Progress Notes (Signed)
[facility]                                                                               [name], is a 78 y.o. male, DOB - [birth year], WUJ:[id number] Admit date - 03/21/2023    Outpatient Primary MD for the patient is [name] Levins, MD  LOS - 2  days    Brief summary    [name] is a 78 y.o. male with medical history significant for insulin-dependent type 2 diabetes, diabetic peripheral neuropathy, hyperlipidemia, persistent atrial fibrillation on anticoagulation, ischemic cardiomyopathy EF 20% currently being treated for right lower extremity diabetic wound and is being admitted to the hospital with rhabdomyolysis after a fall at home today.   He has been followed by Dr. Lady Gary for a right lower extremity diabetic wound, recent outpatient cultures grew Proteus and he has been placed on oral Levaquin.  Workup in the emergency department is actually quite benign, though he is significantly hypotensive, and has elevated CK.  He was admitted for hypotension and rhabdomyolysis. Hospital course significant for hyperkalemia, AKI and acute respiratory failure with hypoxia secondary to meta pneumovirus infection and acute CHF.     Assessment & Plan    Assessment and Plan:  Acute respiratory failure with hypoxia secondary to a combination acute on chronic systolic failure and meta pneumovirus infection.   Yesterday evening pt went into respiratory distress with diffuse wheezing , dyspnea, requiring up to 6 lit/min of [place] oxygen.  Respiratory panel came back positive for meta pneumovirus infection.  COVID is negative.  CXR showing improving pulmonary edema.  ABG shows hypoxia.  He was started on IV solumedrol 60 mg daily, with improvement in his respiratory status.  Currently he has been weaned down to 2 lit of [place] oxygen.  Recommending to continue IV solumedrol for another 24 hours and transition to prednisone.  Meanwhile continue with duonebs.    Rhabdomyolysis:    Much improved.   In view of h/o severe ischemic cardiomyopathy with EF of 20%, we stopped the IV fluids.  Will monitor CK levels off fluids.    Orthostatic hypotension:  Elevated lactic acid/ lactic acidosis resolved.    Generalized Weakness and falls suspect this may be due to overdiuresis due to some medication errors as described by his daughter (sometimes accidentally takes double doses of medications) and resulting dehydration with hypotension and meta pneumovirus infection.  Therapy evaluations ordered and pending.    AKI: From fluid overload and hypoperfusion to kidneys.  Creatinine back to baseline today.    Hyponatremia:  Suspect from fluid overload.    Elevated liver enzymes:  Unclear etiology. Pt denies any nausea, vomiting or abdominal pain.  ? Congestion from CHF.  Monitor.    Diabetic foot wound on the right. X-ray today without evidence of osteomyelitis or other complication.  Follows up with Dr Lady Gary as outpatient.  Complete 10 day course of antibiotics.  Wound care on board.   Insulin Dependent Type 2 DM CBG (last 3)  Recent Labs    03/23/23 1707 03/23/23 2051 03/24/23 0729  GLUCAP 156* 146* 166*   Resume SSI.  A1c is 7.2%.  Hyperlipidemia Holding crestor for rhabdomyolysis.    Atrial fibrillation Rate controlled. With metoprolol. Continue with xarelto.  Tikosyn held for hyperkalemia, rhabdomyolysis.  Cardiology on board and appreciate recommendations.    Hyperkalemia Resolved.    Anxiety  Resume ativan.      Estimated body mass index is 26.41 kg/m as calculated from the following:   Height as of this encounter: 5\' 10"  (1.778 m).   Weight as of this encounter: 83.5 kg.  Code Status: full code. DVT Prophylaxis:  SCDs Start: 03/21/23 1728 rivaroxaban (XARELTO) tablet 20 mg   Level of Care: Level of care: Progressive Family Communication: none at bedside.   Disposition Plan:     Remains inpatient appropriate:  pending  clinical improvement.   Procedures:  None.   Consultants:   Cardiology.  Antimicrobials:   Anti-infectives (From admission, onward)    Start     Dose/Rate Route Frequency Ordered Stop   03/22/23 0500  ceFEPIme (MAXIPIME) 2 g in sodium chloride 0.9 % 100 mL IVPB        2 g 200 mL/hr over 30 Minutes Intravenous Every 12 hours 03/21/23 1811 03/24/23 2359   03/21/23 1345  ceFEPIme (MAXIPIME) 2 g in sodium chloride 0.9 % 100 mL IVPB        2 g 200 mL/hr over 30 Minutes Intravenous  Once 03/21/23 1338 03/21/23 1500        Medications  Scheduled Meds:  aspirin EC  81 mg Oral Daily   empagliflozin  25 mg Oral Daily   fluticasone  2 spray Each Nare Daily   insulin aspart  0-15 Units Subcutaneous TID WC   insulin aspart  0-5 Units Subcutaneous QHS   insulin glargine-yfgn  14 Units Subcutaneous Daily   loratadine  10 mg Oral Daily   LORazepam  0.5 mg Oral QHS   methylPREDNISolone (SOLU-MEDROL) injection  60 mg Intravenous Daily   metoprolol  200 mg Oral QPC supper   rivaroxaban  20 mg Oral QPC supper   Continuous Infusions:  ceFEPime (MAXIPIME) IV 2 g (03/24/23 0528)   PRN Meds:.acetaminophen **OR** acetaminophen, benzonatate, guaiFENesin-dextromethorphan, ipratropium-albuterol, LORazepam, phenol, zolpidem    Subjective:   [name] was seen and examined today.  He is in good spirits. Denies any chest pain . Reports his breathing has improved.   Objective:   Vitals:   03/23/23 2356 03/24/23 0356 03/24/23 0500 03/24/23 0731  BP: 99/71 111/72  113/68  Pulse: 77 80  76  Resp: 15 14  18   Temp: 98 F (36.7 C) 97.8 F (36.6 C)  97.7 F (36.5 C)  TempSrc: Oral Oral  Oral  SpO2: 98% 99%  95%  Weight:   83.5 kg   Height:        Intake/Output Summary (Last 24 hours) at 03/24/2023 0929 Last data filed at 03/24/2023 0600 Gross per 24 hour  Intake 880 ml  Output 4450 ml  Net -3570 ml   Filed Weights   03/21/23 1255 03/21/23 1840 03/24/23 0500  Weight: 87 kg 84.6 kg  83.5 kg     Exam  General exam: Appears calm and comfortable  Respiratory system: wheezing posteriorly, air entry fair on 2 lit OF [place] Oxygen.  Cardiovascular system: S1 & S2 heard, irregularly irregular.  Gastrointestinal system: Abdomen is nondistended, soft and nontender.  Central nervous system: Alert and oriented.  Extremities: Symmetric 5 x 5 power. Skin: No rashes, Psychiatry: Mood & affect appropriate.    Data Reviewed:  I have personally reviewed  following labs and imaging studies   CBC Lab Results  Component Value Date   WBC 7.4 03/24/2023   RBC 4.59 03/24/2023   HGB 13.6 03/24/2023   HCT 41.2 03/24/2023   MCV 89.8 03/24/2023   MCH 29.6 03/24/2023   PLT 119 (L) 03/24/2023   MCHC 33.0 03/24/2023   RDW 14.1 03/24/2023   LYMPHSABS 0.5 (L) 03/24/2023   MONOABS 0.4 03/24/2023   EOSABS 0.0 03/24/2023   BASOSABS 0.0 03/24/2023     Last metabolic panel Lab Results  Component Value Date   NA 133 (L) 03/24/2023   K 4.0 03/24/2023   CL 96 (L) 03/24/2023   CO2 25 03/24/2023   BUN 32 (H) 03/24/2023   CREATININE 1.21 03/24/2023   GLUCOSE 161 (H) 03/24/2023   GFRNONAA >60 03/24/2023   GFRAA >60 03/18/2017   CALCIUM 8.3 (L) 03/24/2023   PROT 6.9 03/23/2023   ALBUMIN 3.3 (L) 03/23/2023   BILITOT 1.3 (H) 03/23/2023   ALKPHOS 91 03/23/2023   AST 188 (H) 03/23/2023   ALT 130 (H) 03/23/2023   ANIONGAP 12 03/24/2023    CBG (last 3)  Recent Labs    03/23/23 1707 03/23/23 2051 03/24/23 0729  GLUCAP 156* 146* 166*      Coagulation Profile: Recent Labs  Lab 03/21/23 1405  INR 2.0*     Radiology Studies: DG CHEST PORT 1 VIEW Result Date: 03/23/2023 CLINICAL DATA:  Shortness of breath EXAM: PORTABLE CHEST 1 VIEW COMPARISON:  03/23/2023 FINDINGS: Left AICD remains in place, unchanged. Heart is mildly enlarged. Aortic atherosclerosis. Improving pulmonary edema pattern since prior study. Slight residual edema suspected. No effusions. No acute bony  abnormality. IMPRESSION: Cardiomegaly, improving edema with slight residual edema. Electronically Signed   By: Charlett Nose M.D.   On: 03/23/2023 19:20   DG Chest Port 1 View Result Date: 03/23/2023 CLINICAL DATA:  Increasing shortness of breath EXAM: PORTABLE CHEST 1 VIEW COMPARISON:  03/21/2023 FINDINGS: Stable cardiomegaly. Left atrial appendage occlusion device. Left chest wall CRT-D. Hazy airspace opacities in a perihilar and lower lung distribution. No definite pleural effusion. No pneumothorax. IMPRESSION: Cardiomegaly with pulmonary edema. Electronically Signed   By: Minerva Fester M.D.   On: 03/23/2023 03:53       Kathlen Mody M.D. [facility] 03/24/2023, 9:29 AM  Available via Epic secure chat 7am-7pm After 7 pm, please refer to night coverage provider listed on amion.

## 2023-03-24 NOTE — Progress Notes (Signed)
Pharmacy Antibiotic Note  [name] is a 78 y.o. male admitted on 03/21/2023 with diabetic  foot wound .  Pharmacy has been consulted for cefepime dosing.  Outpatient note by Dr. Lady Gary on 03/20/23 mentions culture + for proteus and pseudomonas, pt was prescribed a 10 day course of levofloxacin on 03/15/23.   Today, 03/24/23 WBC WNL. Noted pt on IV steroids SCr at baseline Afebrile  Today is #4 IV antibiotics, but day #10 of total antibiotic course counting levofloxacin PTA.   Plan: Continue cefepime 2 g IV q12h through this evening  Discussed with MD - today will complete 10 days of antibiotics. Pharmacy to sign off.    Height: 5\' 10"  (177.8 cm) Weight: 83.5 kg (184 lb 1.4 oz) IBW/kg (Calculated) : 73  Temp (24hrs), Avg:98 F (36.7 C), Min:97.7 F (36.5 C), Max:99 F (37.2 C)  Recent Labs  Lab 03/21/23 1405 03/21/23 1422 03/22/23 0517 03/22/23 1117 03/23/23 0510 03/23/23 1121 03/23/23 1500 03/23/23 1917 03/24/23 0347  WBC 7.2  --  8.2  --  10.0  --   --  11.5* 7.4  CREATININE 1.25*  --  1.14  --  1.50* 1.34*  --  1.48* 1.21  LATICACIDVEN  --  2.1*  --  4.1*  --  1.9 1.8  --   --     Estimated Creatinine Clearance: 54.5 mL/min (by C-G formula based on SCr of 1.21 mg/dL).    Allergies  Allergen Reactions   [place] [Fish Allergy] Other (See Comments)    Only canned/ not fresh (probably preservatives)   Sulfa Antibiotics Other (See Comments)    Not sure he has an allergy to this (??)   Metformin And Related Diarrhea    Antimicrobials this admission: cefepime 1/21 >>   Dose adjustments this admission:  Microbiology results: 1/21 BCx: ngtd 1/23 Respiratory panel: + metapneumovirus  Cindi Carbon, PharmD 03/24/2023 8:47 AM

## 2023-03-24 NOTE — Evaluation (Signed)
Occupational Therapy Evaluation Patient Details Name: [name] MRN: [id number] DOB: 07-Aug-1945 Today's Date: 03/24/2023   History of Present Illness 78 y.o. male admitted with rhabdomyolysis after a fall at home. Pt with medical history significant for insulin-dependent type 2 diabetes, diabetic peripheral neuropathy, hyperlipidemia, persistent atrial fibrillation on anticoagulation, ischemic cardiomyopathy EF 20% currently being treated for right lower extremity non-heeling forefoot diabetic wound, R transmetatarsal amputation 2021.   Clinical Impression   Patient is currently requiring assistance with ADLs including up to moderate assist with Lower body ADLs using lateral leans for peri care hygiene and LB dressing, setup/supervision assist with Upper body ADLs,  as well as  Minimal assist with bed mobility and up to Maximum assist with functional transfers to Recliner (Simulated as BSC).    Current level of function is below patient's typical baseline, however it sounds as if pt has steadily required increased care in last 6 months with frequent falls and questionable compliance with WB restrictions to RLE.   During this evaluation, patient was limited by generalized weakness, impaired activity tolerance, HOH, mild cognitive deficits, and NWB to RLE, all of which has the potential to impact patient's safety and independence during functional mobility, as well as performance for ADLs.  Patient lives at home, alone with family members who are unable to provide regular supervision and assistance.   Pt currently requires 24/7 care and up to 2 people to safely transfer, therefore return home is not recommended. Patient demonstrates fair rehab potential, and should benefit from continued skilled occupational therapy services while in acute care to maximize safety, independence and quality of life at home.  Continued occupational therapy services at a skilled nursing facility are recommended.  ?         If plan is discharge home, recommend the following: A lot of help with walking and/or transfers;A lot of help with bathing/dressing/bathroom;Two people to help with walking and/or transfers;Supervision due to cognitive status    Functional Status Assessment  Patient has had a recent decline in their functional status and demonstrates the ability to make significant improvements in function in a reasonable and predictable amount of time.  Equipment Recommendations   (Defer to next level of care)    Recommendations for Other Services       Precautions / Restrictions Precautions Precautions: Fall Precaution Comments: 3 falls in past 6 months per daughter Jae Dire Restrictions Weight Bearing Restrictions Per Provider Order: Yes RLE Weight Bearing Per Provider Order: Non weight bearing Other Position/Activity Restrictions: per Dr. Lady Gary at wound center.      Mobility Bed Mobility Overal bed mobility: Needs Assistance Bed Mobility: Supine to Sit     Supine to sit: Min assist     General bed mobility comments: assist to raise trunk; cues for sequncing, use of rails.    Transfers  See ADLs                        Balance Overall balance assessment: Needs assistance, History of Falls   Sitting balance-Leahy Scale: Good Sitting balance - Comments: Performed sponge bathing at EOB   Standing balance support: Bilateral upper extremity supported Standing balance-Leahy Scale: Poor Standing balance comment: NWB RLE                           ADL either performed or assessed with clinical judgement   ADL Overall ADL's : Needs assistance/impaired Eating/Feeding: Independent;Sitting   Grooming:  Wash/dry face;Wash/dry hands;Applying deodorant;Sitting;Set up;Minimal assistance Grooming Details (indicate cue type and reason): EOB Upper Body Bathing: Set up;Supervision/ safety;Sitting;Cueing for sequencing   Lower Body Bathing: Minimal  assistance;Sitting/lateral leans Lower Body Bathing Details (indicate cue type and reason): Pt and ex-spouse educated on compensatory positioning and use of rolling and bed for LB ADLs to reduce risk of WB and harm to RLE. Upper Body Dressing : Supervision/safety;Set up;Cueing for sequencing;Sitting Upper Body Dressing Details (indicate cue type and reason): EOB Lower Body Dressing: Moderate assistance;Sitting/lateral leans;Sit to/from stand;Cueing for sequencing;Cueing for safety;Cueing for compensatory techniques;Maximal assistance   Toilet Transfer: Moderate assistance;Maximal assistance;Stand-pivot;Rolling walker (2 wheels);Cueing for sequencing;Cueing for safety Toilet Transfer Details (indicate cue type and reason): Pt required HEAVY RLE blocking, to keep RT foot off floor when initiating sit to stand to RW from elevated EOB. Cues for hands/safety. Pt stood with Mod As and used RW to pivot LT foot, then 2 hops to ling up to chair with moments of Max As need to maintain NWB to RLE and maintain balance. Toileting- Clothing Manipulation and Hygiene: Sitting/lateral lean;Moderate assistance       Functional mobility during ADLs: Cueing for sequencing;Cueing for safety;Rolling walker (2 wheels);Moderate assistance;Maximal assistance       Vision   Vision Assessment?: No apparent visual deficits     Perception         Praxis         Pertinent Vitals/Pain Pain Assessment Pain Assessment: No/denies pain Breathing: normal Negative Vocalization: none Facial Expression: smiling or inexpressive Body Language: relaxed Consolability: no need to console PAINAD Score: 0 Pain Location: throat Pain Descriptors / Indicators: Sore Pain Intervention(s): Monitored during session (Pt reported relief with cold water which was provided.)     Extremity/Trunk Assessment Upper Extremity Assessment Upper Extremity Assessment: RUE deficits/detail;LUE deficits/detail RUE Deficits / Details: Pt  reports UB weakness. Grossly 4/5 except grip ~3+/5 LUE Deficits / Details: Pt reports UB weakness. Grossly 4/5 except grip ~3+/5   Lower Extremity Assessment Lower Extremity Assessment: Defer to PT evaluation RLE Deficits / Details: RT forefoot bandaged. RLE Sensation: decreased light touch;history of peripheral neuropathy LLE Deficits / Details: knee ext +4/5 LLE Sensation: decreased light touch;history of peripheral neuropathy   Cervical / Trunk Assessment Cervical / Trunk Assessment: Normal   Communication Communication Communication: Hearing impairment (Pt donned hearing aids B)   Cognition Arousal: Alert Behavior During Therapy: Impulsive Overall Cognitive Status: Within Functional Limits for tasks assessed                                 General Comments: Pt likes to joke and does need redirection. Pt with difficulty maintaining NWB to RLE during step-pivot to chair with RW and required near constant multimodal cues, including blocking RT foot physically from touching floor.     General Comments       Exercises     Shoulder Instructions      Home Living Family/patient expects to be discharged to:: Private residence Living Arrangements: Alone Available Help at Discharge: Available PRN/intermittently Type of Home: House Home Access: Stairs to enter [employer] of Steps: 3   Home Layout: One level     Bathroom Shower/Tub: Walk-in shower         Home Equipment: Teacher, English as a foreign language (2 wheels);Shower seat;Toilet riser   Additional Comments: lives alone, daughter Jae Dire goes to MD appointments with him; rail being installed for stairs; famiy not available to assist  Prior Functioning/Environment Prior Level of Function : Independent/Modified Independent;Driving;History of Falls (last six months)             Mobility Comments: had healing cast and boot placed 03/20/23, hadn't really walked much with it bc he fell that day (was  lightheaded); prior to cast placement pt walked without AD, had healing shoe prior to cast. Several falls in past 6 months. ADLs Comments: independent        OT Problem List: Impaired sensation;Decreased knowledge of precautions;Impaired balance (sitting and/or standing);Decreased activity tolerance;Decreased knowledge of use of DME or AE;Decreased safety awareness;Decreased strength;Decreased cognition      OT Treatment/Interventions: Self-care/ADL training;Therapeutic activities;Therapeutic exercise;Cognitive remediation/compensation;Patient/family education;Balance training;DME and/or AE instruction    OT Goals(Current goals can be found in the care plan section) Acute Rehab OT Goals Patient Stated Goal: Be able to at least place RT heel on floor for balance. OT Goal Formulation: With patient/family Time For Goal Achievement: 04/07/23 Potential to Achieve Goals: Fair ADL Goals Pt Will Perform Lower Body Bathing: sitting/lateral leans;bed level;with adaptive equipment;with contact guard assist Pt Will Perform Lower Body Dressing: sitting/lateral leans;bed level;with contact guard assist Pt Will Transfer to Toilet: stand pivot transfer;bedside commode;with contact guard assist Pt Will Perform Toileting - Clothing Manipulation and hygiene: with adaptive equipment;sitting/lateral leans Pt/caregiver will Perform Home Exercise Program: Increased ROM;Increased strength;Both right and left upper extremity;With Supervision Additional ADL Goal #1: Patient will identify at least 3 fall prevention strategies to employ at home in order to maximize function and safety during ADLs and decrease caregiver burden while preventing possible injury and rehospitalization. Additional ADL Goal #2: Pt will demonstrate NWB to RLE consistently with verbal cues only before mobilizing, during ADLs and functional bathroom mobility.  OT Frequency: Min 1X/week    Co-evaluation              AM-PAC OT "6 Clicks"  Daily Activity     Outcome Measure Help from another person eating meals?: None Help from another person taking care of personal grooming?: A Little Help from another person toileting, which includes using toliet, bedpan, or urinal?: A Lot Help from another person bathing (including washing, rinsing, drying)?: A Lot Help from another person to put on and taking off regular upper body clothing?: A Little Help from another person to put on and taking off regular lower body clothing?: A Lot 6 Click Score: 16   End of Session Equipment Utilized During Treatment: Gait belt;Rolling walker (2 wheels);Oxygen Nurse Communication: Mobility status  Activity Tolerance: Patient tolerated treatment well Patient left: in chair;with call bell/phone within reach;with chair alarm set;with family/visitor present  OT Visit Diagnosis: Unsteadiness on feet (R26.81);Dizziness and giddiness (R42);History of falling (Z91.81);Muscle weakness (generalized) (M62.81);Repeated falls (R29.6) (NWB RLE)                Time: 1610-9604 OT Time Calculation (min): 33 min Charges:  OT General Charges $OT Visit: 1 Visit OT Evaluation $OT Eval Low Complexity: 1 Low OT Treatments $Self Care/Home Management : 8-22 mins  Victorino Dike, OT Acute Rehab Services Office: [phone] 03/24/2023   Theodoro Clock 03/24/2023, 11:43 AM

## 2023-03-24 NOTE — TOC Progression Note (Signed)
Transition of Care [facility]) - Progression Note   Patient Details  Name: [name] MRN: [id number] Date of Birth: [birth year]  Transition of Care [facility]) CM/SW Contact  Ewing Schlein, LCSW Phone Number: 03/24/2023, 12:32 PM  Clinical Narrative: CSW provided ex-wife and patient with list of bed offers:  [facility] [address] [place], [place] [address] [phone] Overall rating ??  Below average  [facility] [address] [place], [place] [address] [phone] Overall rating ? Much below average  [facility] [address] [place], [place] [address] [phone] Overall rating ? Much below average  [facility] [address] [place], [place] [address] [phone] Overall rating ?? Below average  [facility] [address] [place], [place] [address] [phone] Overall rating ? Much below average  [facility] and [facility] [address] [place], [place] [address] [phone] Overall rating ????? Much above average  [facility] and [facility] [address] [place], [place] [address] [phone] Overall rating ? Much below average  [facility] and [facility] [address] [place], [place] [address] [phone] Overall rating ?? Below average  [facility] was chosen. CSW reached out to [place] in admissions at [facility] to confirm bed. Per Tresa Endo, a male bed is not expected to become available until 1/29-1/30. CSW updated hospitalist.  Expected Discharge Plan: Skilled Nursing Facility Barriers to Discharge: SNF Pending bed offer, Continued Medical Work up  Expected Discharge Plan and Services In-house Referral: Clinical Social Work Discharge Planning Services: Other - See comment (SNF) Living arrangements for the past 2 months: Single Family  Home DME Agency: NA  Social Determinants of Health (SDOH) Interventions SDOH Screenings   Food Insecurity: No Food Insecurity (03/21/2023)  Housing: Low Risk  (03/21/2023)  Transportation Needs: No Transportation Needs (03/21/2023)  Utilities: Not At Risk (03/21/2023)  Alcohol Screen: Low Risk  (03/18/2022)  Depression (PHQ2-9): Low Risk  (08/24/2022)  Financial Resource Strain: Low Risk  (03/18/2022)  Physical Activity: Inactive (03/18/2022)  Social Connections: Socially Isolated (03/21/2023)  Stress: No Stress Concern Present (03/18/2022)  Tobacco Use: Low Risk  (03/21/2023)   Readmission Risk Interventions    03/23/2023    1:14 PM  Readmission Risk Prevention Plan  Transportation Screening Complete  PCP or Specialist Appt within 5-7 Days Complete  Home Care Screening Complete  Medication Review (RN CM) Complete

## 2023-03-24 NOTE — Progress Notes (Signed)
Cardiology:  [name]  Subjective:  Denies SSCP, palpitations or Dyspnea Wants to go home and get cast off  Positive for Metapneumovirus  Objective:  Vitals:   03/23/23 2356 03/24/23 0356 03/24/23 0500 03/24/23 0731  BP: 99/71 111/72  113/68  Pulse: 77 80  76  Resp: 15 14  18   Temp: 98 F (36.7 C) 97.8 F (36.6 C)  97.7 F (36.5 C)  TempSrc: Oral Oral  Oral  SpO2: 98% 99%  95%  Weight:   83.5 kg   Height:        Intake/Output from previous day:  Intake/Output Summary (Last 24 hours) at 03/24/2023 0940 Last data filed at 03/24/2023 7846 Gross per 24 hour  Intake 1100 ml  Output 4800 ml  Net -3700 ml    Physical Exam: Chronically ill white male Dry JVP not elevated AICD under left clavicle Abdomen benign RLE in cast with infection  No edema  Lab Results: Basic Metabolic Panel: Recent Labs    03/22/23 1117 03/23/23 0510 03/23/23 1917 03/24/23 0347  NA  --    < > 128* 133*  K  --    < > 3.5 4.0  CL  --    < > 92* 96*  CO2  --    < > 21* 25  GLUCOSE  --    < > 153* 161*  BUN  --    < > 35* 32*  CREATININE  --    < > 1.48* 1.21  CALCIUM  --    < > 7.9* 8.3*  MG 2.1  --   --   --    < > = values in this interval not displayed.   Liver Function Tests: Recent Labs    03/21/23 1405 03/23/23 1917  AST 43* 188*  ALT 17 130*  ALKPHOS 62 91  BILITOT 0.8 1.3*  PROT 7.2 6.9  ALBUMIN 3.7 3.3*   No results for input(s): "LIPASE", "AMYLASE" in the last 72 hours. CBC: Recent Labs    03/23/23 0510 03/23/23 1917 03/24/23 0347  WBC 10.0 11.5* 7.4  NEUTROABS 8.1*  --  6.4  HGB 15.0 13.7 13.6  HCT 46.3 41.6 41.2  MCV 91.7 90.0 89.8  PLT 137* 136* 119*   Cardiac Enzymes: Recent Labs    03/21/23 1405 03/22/23 0517 03/23/23 0510  CKTOTAL 1,750* 1,151* 603*    Imaging: DG CHEST PORT 1 VIEW Result Date: 03/23/2023 CLINICAL DATA:  Shortness of breath EXAM: PORTABLE CHEST 1 VIEW COMPARISON:  03/23/2023 FINDINGS: Left AICD remains in place,  unchanged. Heart is mildly enlarged. Aortic atherosclerosis. Improving pulmonary edema pattern since prior study. Slight residual edema suspected. No effusions. No acute bony abnormality. IMPRESSION: Cardiomegaly, improving edema with slight residual edema. Electronically Signed   By: Charlett Nose M.D.   On: 03/23/2023 19:20   DG Chest Port 1 View Result Date: 03/23/2023 CLINICAL DATA:  Increasing shortness of breath EXAM: PORTABLE CHEST 1 VIEW COMPARISON:  03/21/2023 FINDINGS: Stable cardiomegaly. Left atrial appendage occlusion device. Left chest wall CRT-D. Hazy airspace opacities in a perihilar and lower lung distribution. No definite pleural effusion. No pneumothorax. IMPRESSION: Cardiomegaly with pulmonary edema. Electronically Signed   By: Minerva Fester M.D.   On: 03/23/2023 03:53    Cardiac Studies:  ECG: AV pacing 75    Telemetry: AV pacing QT 486 corrected for paced QRS 456     Medications:    aspirin EC  81 mg Oral Daily   empagliflozin  25 mg Oral Daily   fluticasone  2 spray Each Nare Daily   insulin aspart  0-15 Units Subcutaneous TID WC   insulin aspart  0-5 Units Subcutaneous QHS   insulin glargine-yfgn  14 Units Subcutaneous Daily   loratadine  10 mg Oral Daily   LORazepam  0.5 mg Oral QHS   methylPREDNISolone (SOLU-MEDROL) injection  60 mg Intravenous Daily   metoprolol  200 mg Oral QPC supper   rivaroxaban  20 mg Oral QPC supper      ceFEPime (MAXIPIME) IV 2 g (03/24/23 0528)    Assessment/Plan:  PAF:  no rapid rates on xarelto Prior convergent Rx at [facility] with [facility] ? Percutaneous ligation.Corrected QTc ok with no Zofran and improving K/Cr as rhabdo resolves Discussed with EP will resume Tikosyn at lower dose 250 micrograms bid  CAD:  prior stenting of all 3 arteries no angina ASA and beta blocker Holding statin with rhabdo DCM:  EF 20% dehydrated holding home jardiaance, inspra, entresto , demadex will resume entresto today and can resume Jardiance and demadex in  am Rhabdo:  Cr 1.2 , K 4.0 improving   ID:  RLE infection on Levaquin has been seeing wound center Transmetatarsal amputation Xray 1/21 no acute evidence of osteomyelitis now on Maxipime  Resume Tikosyn at lower dose 250 micrograms bid  Outpatient f/u with Devonne Doughty at [facility] 03/24/2023, 9:40 AM

## 2023-03-25 DIAGNOSIS — I255 Ischemic cardiomyopathy: Secondary | ICD-10-CM | POA: Diagnosis not present

## 2023-03-25 DIAGNOSIS — R748 Abnormal levels of other serum enzymes: Secondary | ICD-10-CM | POA: Diagnosis not present

## 2023-03-25 DIAGNOSIS — I951 Orthostatic hypotension: Secondary | ICD-10-CM | POA: Diagnosis not present

## 2023-03-25 DIAGNOSIS — T796XXD Traumatic ischemia of muscle, subsequent encounter: Secondary | ICD-10-CM | POA: Diagnosis not present

## 2023-03-25 LAB — GLUCOSE, CAPILLARY
Glucose-Capillary: 167 mg/dL — ABNORMAL HIGH (ref 70–99)
Glucose-Capillary: 278 mg/dL — ABNORMAL HIGH (ref 70–99)
Glucose-Capillary: 214 mg/dL — ABNORMAL HIGH (ref 70–99)
Glucose-Capillary: 273 mg/dL — ABNORMAL HIGH (ref 70–99)

## 2023-03-25 LAB — BASIC METABOLIC PANEL
Anion gap: 12 (ref 5–15)
BUN: 44 mg/dL — ABNORMAL HIGH (ref 8–23)
CO2: 25 mmol/L (ref 22–32)
Calcium: 8.8 mg/dL — ABNORMAL LOW (ref 8.9–10.3)
Chloride: 99 mmol/L (ref 98–111)
Creatinine, Ser: 1.04 mg/dL (ref 0.61–1.24)
GFR, Estimated: >60 mL/min (ref >60)
Glucose, Bld: 151 mg/dL — ABNORMAL HIGH (ref 70–99)
Potassium: 3.9 mmol/L (ref 3.5–5.1)
Sodium: 136 mmol/L (ref 135–145)

## 2023-03-25 LAB — CK: Total CK: 126 U/L (ref 49–397)

## 2023-03-25 LAB — MAGNESIUM: Magnesium: 2.5 mg/dL — ABNORMAL HIGH (ref 1.7–2.4)

## 2023-03-25 MED ORDER — FUROSEMIDE 10 MG/ML IJ SOLN
20.0000 mg | Freq: Once | INTRAMUSCULAR | Status: AC
  Administered 2023-03-25: 20 mg via INTRAVENOUS
  Filled 2023-03-25: qty 2

## 2023-03-25 MED ORDER — TORSEMIDE 20 MG PO TABS
20.0000 mg | ORAL_TABLET | Freq: Every day | ORAL | Status: DC
Start: 2023-03-25 — End: 2023-03-30
  Administered 2023-03-25 – 2023-03-30 (×6): 20 mg via ORAL
  Filled 2023-03-25 (×6): qty 1

## 2023-03-25 MED ORDER — POLYETHYLENE GLYCOL 3350 17 G PO PACK
17.0000 g | PACK | Freq: Every day | ORAL | Status: DC
  Administered 2023-03-26 – 2023-03-31 (×4): 17 g via ORAL
  Filled 2023-03-25 (×7): qty 1

## 2023-03-25 MED ORDER — POTASSIUM CHLORIDE CRYS ER 20 MEQ PO TBCR
40.0000 meq | EXTENDED_RELEASE_TABLET | Freq: Once | ORAL | Status: AC
  Administered 2023-03-25: 40 meq via ORAL
  Filled 2023-03-25: qty 2

## 2023-03-25 MED ORDER — TORSEMIDE 10 MG PO TABS
10.0000 mg | ORAL_TABLET | Freq: Every day | ORAL | Status: DC
  Filled 2023-03-25: qty 1

## 2023-03-25 MED ORDER — FUROSEMIDE 10 MG/ML IJ SOLN: 20.0000 mg | Freq: Once | INTRAMUSCULAR | Status: DC

## 2023-03-25 MED ORDER — SENNOSIDES-DOCUSATE SODIUM 8.6-50 MG PO TABS
2.0000 | ORAL_TABLET | Freq: Two times a day (BID) | ORAL | Status: DC
  Administered 2023-03-25 – 2023-04-01 (×12): 2 via ORAL
  Filled 2023-03-25 (×14): qty 2

## 2023-03-25 NOTE — Plan of Care (Signed)
  Problem: Clinical Measurements: Goal: Respiratory complications will improve Outcome: Progressing   Problem: Activity: Goal: Risk for activity intolerance will decrease Outcome: Progressing   Problem: Coping: Goal: Level of anxiety will decrease Outcome: Progressing   Problem: Pain Managment: Goal: General experience of comfort will improve and/or be controlled Outcome: Progressing

## 2023-03-25 NOTE — Progress Notes (Signed)
Physical Therapy Treatment Patient Details Name: [name] MRN: [id number] DOB: 10/14/1945 Today's Date: 03/25/2023   History of Present Illness 78 y.o. male admitted with rhabdomyolysis after a fall at home. Pt with medical history significant for insulin-dependent type 2 diabetes, diabetic peripheral neuropathy, hyperlipidemia, persistent atrial fibrillation on anticoagulation, ischemic cardiomyopathy EF 20% currently being treated for right lower extremity non-heeling forefoot diabetic wound, R transmetatarsal amputation 2021.    PT Comments  Pt received supine in bed with daughter Jae Dire present, agreeable to be seen. Pt required minA for bed mobility for trunk elevation, pt sat EOB for some time with supervision, no overt LOB noted, BLE supported, no BUE support; applied hearing aides. Pt was positive for symptomatic orthostatic BP, dizzy throughout session without change related to position, communicated to nursing, see table below. Pt CGA for transfers with RW, pt noncompliant with NWB through RLE, daughter reports pt has boot and will bring in to assist with progression of standing exercises and ambulation. Pt with two productive coughs and would likely benefit from incentive spirometry exercises. Pt completed seated exercises and then lunch arrived, requesting end session to eat. Pt is progressing with acute PT and we will continue to follow.  Orthostatic VS for the past 24 hrs (Last 3 readings):  BP- Lying Pulse- Lying BP- Sitting BP- Standing at 0 minutes BP- Standing at 3 minutes  03/25/23 1100 125/80 113 103/72 104/73 104/69       If plan is discharge home, recommend the following: A little help with walking and/or transfers;A little help with bathing/dressing/bathroom;Assistance with cooking/housework;Assist for transportation;Help with stairs or ramp for entrance   Can travel by private vehicle     Yes  Equipment Recommendations  None recommended by PT    Recommendations for  Other Services       Precautions / Restrictions Precautions Precautions: Fall Precaution Comments: 3 falls in past 6 months per daughter Jae Dire Restrictions Weight Bearing Restrictions Per Provider Order: Yes RLE Weight Bearing Per Provider Order: Non weight bearing Other Position/Activity Restrictions: NWB RLE per Dr. Lady Gary at wound center, has boot that family will bring     Mobility  Bed Mobility Overal bed mobility: Needs Assistance Bed Mobility: Supine to Sit     Supine to sit: Min assist     General bed mobility comments: assist to raise trunk; cues for sequncing, use of rails.    Transfers Overall transfer level: Needs assistance Equipment used: Rolling walker (2 wheels) Transfers: Sit to/from Stand, Bed to chair/wheelchair/BSC Sit to Stand: From elevated surface, Contact guard assist Stand pivot transfers: Contact guard assist         General transfer comment: VCs hand placement and to minimize WBing RLE until WB status is clarified (pt noted to still be weightbearing on RLE despite cuting)    Ambulation/Gait               General Gait Details: Unsafe at present   Stairs             Wheelchair Mobility     Tilt Bed    Modified Rankin (Stroke Patients Only)       Balance Overall balance assessment: Needs assistance, History of Falls   Sitting balance-Leahy Scale: Good Sitting balance - Comments: Performed sponge bathing at EOB   Standing balance support: Bilateral upper extremity supported Standing balance-Leahy Scale: Poor Standing balance comment: NWB RLE  Cognition Arousal: Alert Behavior During Therapy: Impulsive Overall Cognitive Status: Within Functional Limits for tasks assessed                                 General Comments: Pt likes to joke, impulsive and does need redirection. Pt with difficulty maintaining NWB to RLE during step-pivot to chair with RW and required  near constant multimodal cues, including blocking RT foot physically from touching floor.        Exercises General Exercises - Lower Extremity Ankle Circles/Pumps: AROM, Both, 20 reps, Seated Hip Flexion/Marching: AROM, Both, 20 reps, Seated    General Comments General comments (skin integrity, edema, etc.): Daughter kate presen t      Pertinent Vitals/Pain Pain Assessment Pain Assessment: 0-10 Pain Score: 3  Breathing: normal Negative Vocalization: none Facial Expression: smiling or inexpressive Body Language: relaxed Consolability: no need to console PAINAD Score: 0 Pain Location: Back of skull Pain Descriptors / Indicators: Headache Pain Intervention(s): Monitored during session    Home Living                          Prior Function            PT Goals (current goals can now be found in the care plan section) Acute Rehab PT Goals Patient Stated Goal: hopes to DC home PT Goal Formulation: With patient Time For Goal Achievement: 04/06/23 Potential to Achieve Goals: Good Progress towards PT goals: Progressing toward goals    Frequency    Min 1X/week      PT Plan      Co-evaluation              AM-PAC PT "6 Clicks" Mobility   Outcome Measure  Help needed turning from your back to your side while in a flat bed without using bedrails?: None Help needed moving from lying on your back to sitting on the side of a flat bed without using bedrails?: A Little Help needed moving to and from a bed to a chair (including a wheelchair)?: A Little Help needed standing up from a chair using your arms (e.g., wheelchair or bedside chair)?: A Little Help needed to walk in hospital room?: A Lot Help needed climbing 3-5 steps with a railing? : A Lot 6 Click Score: 17    End of Session Equipment Utilized During Treatment: Gait belt;Oxygen (3LO2 via [place] at start, pt removed during bed mobility, replaced at end) Activity Tolerance: Patient tolerated treatment  well Patient left: in chair;with call bell/phone within reach;with chair alarm set;with family/visitor present Nurse Communication: Mobility status;Weight bearing status (O2, Orthostatic vitals) PT Visit Diagnosis: History of falling (Z91.81);Difficulty in walking, not elsewhere classified (R26.2);Repeated falls (R29.6);Unsteadiness on feet (R26.81)     Time: 1200-1230 PT Time Calculation (min) (ACUTE ONLY): 30 min  Charges:    $Therapeutic Activity: 23-37 mins PT General Charges $$ ACUTE PT VISIT: 1 Visit                     Jamesetta Geralds, PT, DPT [facility] Office: [phone]   Jamesetta Geralds 03/25/2023, 12:32 PM

## 2023-03-25 NOTE — Progress Notes (Signed)
[facility]                                                                               [name], is a 78 y.o. male, DOB - 07/03/1945, ZOX:[id number] Admit date - 03/21/2023    Outpatient Primary MD for the patient is Corwin Levins, MD  LOS - 3  days    Brief summary    [name] is a 78 y.o. male with medical history significant for insulin-dependent type 2 diabetes, diabetic peripheral neuropathy, hyperlipidemia, persistent atrial fibrillation on anticoagulation, ischemic cardiomyopathy EF 20% currently being treated for right lower extremity diabetic wound and is being admitted to the hospital with rhabdomyolysis after a fall at home today.   He has been followed by Dr. Lady Gary for a right lower extremity diabetic wound, recent outpatient cultures grew Proteus and he has been placed on oral Levaquin.  Workup in the emergency department is actually quite benign, though he is significantly hypotensive, and has elevated CK.  He was admitted for hypotension and rhabdomyolysis. Hospital course significant for hyperkalemia, AKI and acute respiratory failure with hypoxia secondary to meta pneumovirus infection and acute CHF.     Assessment & Plan    Assessment and Plan:  Acute respiratory failure with hypoxia secondary to a combination acute on chronic systolic failure and meta pneumovirus infection.   Pt had a respiratory event on 1/23 with diffuse wheezing , dyspnea, requiring up to 6 lit/min of [place] oxygen.  Respiratory panel came back positive for meta pneumovirus infection.  COVID is negative.  CXR showing improving pulmonary edema.  ABG shows hypoxia.  He was started on IV solumedrol 60 mg daily, transition to prednisone.  Currently he has been weaned down to 2 lit of [place] oxygen.  Meanwhile continue with duonebs.  Continue with torsemide.    Rhabdomyolysis:   Much improved.   In view of h/o severe ischemic cardiomyopathy with EF of 20%, we stopped the IV  fluids.  Ck levels wnl.    Orthostatic hypotension:  Elevated lactic acid/ lactic acidosis resolved.    Generalized Weakness and falls suspect this may be due to overdiuresis due to some medication errors as described by his daughter (sometimes accidentally takes double doses of medications) and resulting dehydration with hypotension and meta pneumovirus infection.  Therapy evaluations ordered and pending.    AKI: From fluid overload and hypoperfusion to kidneys.  Creatinine back to baseline today.    Hyponatremia:  Suspect from fluid overload.  Resolved.    Elevated liver enzymes:  Unclear etiology. Pt denies any nausea, vomiting or abdominal pain.  ? Congestion from CHF. Check liver panel in am.  Monitor.    Diabetic foot wound on the right. X-ray today without evidence of osteomyelitis or other complication.  Follows up with Dr Lady Gary as outpatient.  Completed 10 day course of antibiotics.  Wound care on board.   Insulin Dependent Type 2 DM CBG (last 3)  Recent Labs    03/24/23 2041 03/25/23 0737 03/25/23 1140  GLUCAP 249* 167* 278*   Resume SSI.  A1c is 7.2%.     Hyperlipidemia Holding crestor for rhabdomyolysis.  Atrial fibrillation Rate controlled. With metoprolol. Continue with xarelto.  Tikosyn initially held for hyperkalemia, rhabdomyolysis.  Cardiology on board and appreciate recommendations.  Keep potassium greater than 4 and magnesium >2.    Hyperkalemia Resolved.    Anxiety  Resume ativan.      Estimated body mass index is 26.79 kg/m as calculated from the following:   Height as of this encounter: 5\' 10"  (1.778 m).   Weight as of this encounter: 84.7 kg.  Code Status: full code. DVT Prophylaxis:  SCDs Start: 03/21/23 1728 rivaroxaban (XARELTO) tablet 20 mg   Level of Care: Level of care: Progressive Family Communication: none at bedside.   Disposition Plan:     Remains inpatient appropriate:  pending clinical improvement.    Procedures:  None.   Consultants:   Cardiology.  Antimicrobials:   Anti-infectives (From admission, onward)    Start     Dose/Rate Route Frequency Ordered Stop   03/22/23 0500  ceFEPIme (MAXIPIME) 2 g in sodium chloride 0.9 % 100 mL IVPB        2 g 200 mL/hr over 30 Minutes Intravenous Every 12 hours 03/21/23 1811 03/24/23 1740   03/21/23 1345  ceFEPIme (MAXIPIME) 2 g in sodium chloride 0.9 % 100 mL IVPB        2 g 200 mL/hr over 30 Minutes Intravenous  Once 03/21/23 1338 03/21/23 1500        Medications  Scheduled Meds:  aspirin EC  81 mg Oral Daily   dofetilide  250 mcg Oral BID   empagliflozin  25 mg Oral Daily   fluticasone  2 spray Each Nare Daily   insulin aspart  0-15 Units Subcutaneous TID WC   insulin aspart  0-5 Units Subcutaneous QHS   insulin glargine-yfgn  14 Units Subcutaneous Daily   loratadine  10 mg Oral Daily   LORazepam  0.5 mg Oral QHS   methylPREDNISolone (SOLU-MEDROL) injection  60 mg Intravenous Daily   metoprolol  200 mg Oral QPC supper   polyethylene glycol  17 g Oral Daily   rivaroxaban  20 mg Oral QPC supper   senna-docusate  2 tablet Oral BID   Continuous Infusions:   PRN Meds:.acetaminophen **OR** acetaminophen, benzonatate, guaiFENesin-dextromethorphan, ipratropium-albuterol, LORazepam, phenol, zolpidem    Subjective:   [name] was seen and examined today.  Still has diffuse wheezing.   Objective:   Vitals:   03/25/23 1010 03/25/23 1015 03/25/23 1016 03/25/23 1244  BP:    115/75  Pulse:    94  Resp:      Temp:    97.8 F (36.6 C)  TempSrc:    Oral  SpO2: 99% 99% 99% 97%  Weight:      Height:        Intake/Output Summary (Last 24 hours) at 03/25/2023 1626 Last data filed at 03/25/2023 1500 Gross per 24 hour  Intake 720 ml  Output 2725 ml  Net -2005 ml   Filed Weights   03/21/23 1840 03/24/23 0500 03/25/23 0604  Weight: 84.6 kg 83.5 kg 84.7 kg     Exam  General exam: Appears calm and comfortable   Respiratory system: bilateral diffuse wheezing on 2 lit of [place] oxygen.  Cardiovascular system: S1 & S2 heard, RRR. No JVD,  Gastrointestinal system: Abdomen is nondistended, soft and nontender. Central nervous system: Alert and oriented.  Extremities: Symmetric 5 x 5 power. Skin: No rashes,  Psychiatry:  Mood & affect appropriate.     Data Reviewed:  I have  personally reviewed following labs and imaging studies   CBC Lab Results  Component Value Date   WBC 7.4 03/24/2023   RBC 4.59 03/24/2023   HGB 13.6 03/24/2023   HCT 41.2 03/24/2023   MCV 89.8 03/24/2023   MCH 29.6 03/24/2023   PLT 119 (L) 03/24/2023   MCHC 33.0 03/24/2023   RDW 14.1 03/24/2023   LYMPHSABS 0.5 (L) 03/24/2023   MONOABS 0.4 03/24/2023   EOSABS 0.0 03/24/2023   BASOSABS 0.0 03/24/2023     Last metabolic panel Lab Results  Component Value Date   NA 136 03/25/2023   K 3.9 03/25/2023   CL 99 03/25/2023   CO2 25 03/25/2023   BUN 44 (H) 03/25/2023   CREATININE 1.04 03/25/2023   GLUCOSE 151 (H) 03/25/2023   GFRNONAA >60 03/25/2023   GFRAA >60 03/18/2017   CALCIUM 8.8 (L) 03/25/2023   PROT 6.9 03/23/2023   ALBUMIN 3.3 (L) 03/23/2023   BILITOT 1.3 (H) 03/23/2023   ALKPHOS 91 03/23/2023   AST 188 (H) 03/23/2023   ALT 130 (H) 03/23/2023   ANIONGAP 12 03/25/2023    CBG (last 3)  Recent Labs    03/24/23 2041 03/25/23 0737 03/25/23 1140  GLUCAP 249* 167* 278*      Coagulation Profile: Recent Labs  Lab 03/21/23 1405  INR 2.0*     Radiology Studies: DG CHEST PORT 1 VIEW Result Date: 03/23/2023 CLINICAL DATA:  Shortness of breath EXAM: PORTABLE CHEST 1 VIEW COMPARISON:  03/23/2023 FINDINGS: Left AICD remains in place, unchanged. Heart is mildly enlarged. Aortic atherosclerosis. Improving pulmonary edema pattern since prior study. Slight residual edema suspected. No effusions. No acute bony abnormality. IMPRESSION: Cardiomegaly, improving edema with slight residual edema. Electronically  Signed   By: Charlett Nose M.D.   On: 03/23/2023 19:20       Kathlen Mody M.D. [facility] 03/25/2023, 4:26 PM  Available via Epic secure chat 7am-7pm After 7 pm, please refer to night coverage provider listed on amion.

## 2023-03-25 NOTE — Progress Notes (Signed)
Nothing new to add, will follow the patient peripherally

## 2023-03-26 DIAGNOSIS — R9431 Abnormal electrocardiogram [ECG] [EKG]: Secondary | ICD-10-CM

## 2023-03-26 DIAGNOSIS — I951 Orthostatic hypotension: Secondary | ICD-10-CM | POA: Diagnosis not present

## 2023-03-26 DIAGNOSIS — R748 Abnormal levels of other serum enzymes: Secondary | ICD-10-CM | POA: Diagnosis not present

## 2023-03-26 DIAGNOSIS — I255 Ischemic cardiomyopathy: Secondary | ICD-10-CM | POA: Diagnosis not present

## 2023-03-26 DIAGNOSIS — T796XXD Traumatic ischemia of muscle, subsequent encounter: Secondary | ICD-10-CM | POA: Diagnosis not present

## 2023-03-26 LAB — BASIC METABOLIC PANEL
Anion gap: 12 (ref 5–15)
BUN: 48 mg/dL — ABNORMAL HIGH (ref 8–23)
CO2: 27 mmol/L (ref 22–32)
Calcium: 8.8 mg/dL — ABNORMAL LOW (ref 8.9–10.3)
Chloride: 101 mmol/L (ref 98–111)
Creatinine, Ser: 0.85 mg/dL (ref 0.61–1.24)
GFR, Estimated: >60 mL/min (ref >60)
Glucose, Bld: 166 mg/dL — ABNORMAL HIGH (ref 70–99)
Potassium: 3.4 mmol/L — ABNORMAL LOW (ref 3.5–5.1)
Sodium: 140 mmol/L (ref 135–145)

## 2023-03-26 LAB — GLUCOSE, CAPILLARY
Glucose-Capillary: 167 mg/dL — ABNORMAL HIGH (ref 70–99)
Glucose-Capillary: 331 mg/dL — ABNORMAL HIGH (ref 70–99)
Glucose-Capillary: 215 mg/dL — ABNORMAL HIGH (ref 70–99)
Glucose-Capillary: 207 mg/dL — ABNORMAL HIGH (ref 70–99)

## 2023-03-26 LAB — CULTURE, BLOOD (ROUTINE X 2)
Culture: NO GROWTH
Culture: NO GROWTH
Special Requests: ADEQUATE
Special Requests: ADEQUATE

## 2023-03-26 LAB — HEPATIC FUNCTION PANEL
ALT: 128 U/L — ABNORMAL HIGH (ref 0–44)
AST: 86 U/L — ABNORMAL HIGH (ref 15–41)
Albumin: 3.2 g/dL — ABNORMAL LOW (ref 3.5–5.0)
Alkaline Phosphatase: 109 U/L (ref 38–126)
Bilirubin, Direct: 0.2 mg/dL (ref 0.0–0.2)
Indirect Bilirubin: 0.7 mg/dL (ref 0.3–0.9)
Total Bilirubin: 0.9 mg/dL (ref 0.0–1.2)
Total Protein: 6.7 g/dL (ref 6.5–8.1)

## 2023-03-26 MED ORDER — INSULIN ASPART 100 UNIT/ML IJ SOLN
2.0000 [IU] | Freq: Three times a day (TID) | INTRAMUSCULAR | Status: DC
  Administered 2023-03-26 – 2023-03-27 (×3): 2 [IU] via SUBCUTANEOUS

## 2023-03-26 MED ORDER — PREDNISONE 20 MG PO TABS: 40.0000 mg | ORAL_TABLET | Freq: Every day | ORAL | Status: DC

## 2023-03-26 MED ORDER — PREDNISONE 20 MG PO TABS
40.0000 mg | ORAL_TABLET | Freq: Every day | ORAL | Status: AC
  Administered 2023-03-26 – 2023-03-28 (×3): 40 mg via ORAL
  Filled 2023-03-26 (×3): qty 2

## 2023-03-26 MED ORDER — POTASSIUM CHLORIDE CRYS ER 20 MEQ PO TBCR
40.0000 meq | EXTENDED_RELEASE_TABLET | Freq: Two times a day (BID) | ORAL | Status: AC
  Administered 2023-03-26 (×2): 40 meq via ORAL
  Filled 2023-03-26 (×2): qty 2

## 2023-03-26 NOTE — Progress Notes (Signed)
[facility]                                                                               [name], is a 78 y.o. male, DOB - Jul 15, 1945, ZOX:[id number] Admit date - 03/21/2023    Outpatient Primary MD for the patient is Corwin Levins, MD  LOS - 4  days    Brief summary    [name] is a 78 y.o. male with medical history significant for insulin-dependent type 2 diabetes, diabetic peripheral neuropathy, hyperlipidemia, persistent atrial fibrillation on anticoagulation, ischemic cardiomyopathy EF 20% currently being treated for right lower extremity diabetic wound and is being admitted to the hospital with rhabdomyolysis after a fall at home today.   He has been followed by Dr. Lady Gary for a right lower extremity diabetic wound, recent outpatient cultures grew Proteus and he has been placed on oral Levaquin.  Workup in the emergency department is actually quite benign, though he is significantly hypotensive, and has elevated CK.  He was admitted for hypotension and rhabdomyolysis. Hospital course significant for hyperkalemia, AKI and acute respiratory failure with hypoxia secondary to meta pneumovirus infection and acute CHF.     Assessment & Plan    Assessment and Plan:  Acute respiratory failure with hypoxia secondary to a combination acute on chronic systolic failure and meta pneumovirus infection.   Pt had a respiratory event on 1/23 with diffuse wheezing , dyspnea, requiring up to 6 lit/min of  oxygen.  Respiratory panel came back positive for meta pneumovirus infection.  COVID is negative.  CXR showing improving pulmonary edema.  ABG shows hypoxia.  He was started on IV solumedrol 60 mg daily, transition to prednisone today.  Continue to wean oxygen off Meanwhile continue with duonebs.  Continue with torsemide.    Rhabdomyolysis:   Much improved.   In view of h/o severe ischemic cardiomyopathy with EF of 20%, we stopped the IV fluids.  Ck levels  wnl.    Orthostatic hypotension:  Elevated lactic acid/ lactic acidosis resolved.    Generalized Weakness and falls suspect this may be due to overdiuresis due to some medication errors as described by his daughter (sometimes accidentally takes double doses of medications) and resulting dehydration with hypotension and meta pneumovirus infection.  Therapy evaluations ordered and pending.    AKI: From fluid overload and hypoperfusion to kidneys.  Creatinine back to baseline today.    Hyponatremia:  Suspect from fluid overload.  Resolved.    Elevated liver enzymes:  Unclear etiology. Pt denies any nausea, vomiting or abdominal pain.  ? Congestion from CHF. Check liver panel in am.  Monitor.    Diabetic foot wound on the right. X-ray today without evidence of osteomyelitis or other complication.  Follows up with Dr Lady Gary as outpatient.  Completed 10 day course of antibiotics.  Wound care on board.   Insulin Dependent Type 2 DM uncontrolled with hyperglycemia from steroids.  CBG (last 3)  Recent Labs    03/25/23 2047 03/26/23 0747 03/26/23 1153  GLUCAP 273* 167* 331*   Resume SSI. Add 2 units of novolog TIDAC.  A1c is 7.2%.     Hyperlipidemia Holding crestor  for rhabdomyolysis.    Atrial fibrillation Rate controlled. With metoprolol. Continue with xarelto.  Tikosyn initially held for hyperkalemia, rhabdomyolysis AND qtc >500.  Cardiology on board and appreciate recommendations.  Keep potassium greater than 4 and magnesium >2.    Hyperkalemia Resolved.    Anxiety  Resume ativan.      Estimated body mass index is 26.73 kg/m as calculated from the following:   Height as of this encounter: 5\' 10"  (1.778 m).   Weight as of this encounter: 84.5 kg.  Code Status: full code. DVT Prophylaxis:  SCDs Start: 03/21/23 1728 rivaroxaban (XARELTO) tablet 20 mg   Level of Care: Level of care: Progressive Family Communication: none at bedside.   Disposition  Plan:     Remains inpatient appropriate:  pending clinical improvement.   Procedures:  None.   Consultants:   Cardiology.  Antimicrobials:   Anti-infectives (From admission, onward)    Start     Dose/Rate Route Frequency Ordered Stop   03/22/23 0500  ceFEPIme (MAXIPIME) 2 g in sodium chloride 0.9 % 100 mL IVPB        2 g 200 mL/hr over 30 Minutes Intravenous Every 12 hours 03/21/23 1811 03/24/23 1740   03/21/23 1345  ceFEPIme (MAXIPIME) 2 g in sodium chloride 0.9 % 100 mL IVPB        2 g 200 mL/hr over 30 Minutes Intravenous  Once 03/21/23 1338 03/21/23 1500        Medications  Scheduled Meds:  aspirin EC  81 mg Oral Daily   dofetilide  250 mcg Oral BID   empagliflozin  25 mg Oral Daily   fluticasone  2 spray Each Nare Daily   insulin aspart  0-15 Units Subcutaneous TID WC   insulin aspart  0-5 Units Subcutaneous QHS   insulin glargine-yfgn  14 Units Subcutaneous Daily   loratadine  10 mg Oral Daily   LORazepam  0.5 mg Oral QHS   metoprolol  200 mg Oral QPC supper   polyethylene glycol  17 g Oral Daily   potassium chloride  40 mEq Oral BID   predniSONE  40 mg Oral QAC breakfast   rivaroxaban  20 mg Oral QPC supper   senna-docusate  2 tablet Oral BID   torsemide  20 mg Oral Daily   Continuous Infusions:   PRN Meds:.acetaminophen **OR** acetaminophen, benzonatate, guaiFENesin-dextromethorphan, ipratropium-albuterol, LORazepam, phenol    Subjective:   [name] was seen and examined today. No new complaints.   Objective:   Vitals:   03/26/23 0510 03/26/23 0512 03/26/23 0544 03/26/23 1156  BP: (!) 91/58 103/77  118/81  Pulse: 83 81  88  Resp: 17 20  16   Temp:  98.2 F (36.8 C)  97.6 F (36.4 C)  TempSrc:  Oral  Oral  SpO2: 93% 93%  96%  Weight:   84.5 kg   Height:        Intake/Output Summary (Last 24 hours) at 03/26/2023 1452 Last data filed at 03/26/2023 0900 Gross per 24 hour  Intake 1440 ml  Output 3500 ml  Net -2060 ml   Filed Weights    03/24/23 0500 03/25/23 0604 03/26/23 0544  Weight: 83.5 kg 84.7 kg 84.5 kg     Exam  General exam: Appears calm and comfortable  Respiratory system: scattered wheezing posteriorly. Air entry fair.  Cardiovascular system: S1 & S2 heard, RRR. No JVD,  Gastrointestinal system: Abdomen is nondistended, soft and nontender.  Central nervous system: Alert and oriented. No focal  neurological deficits. Extremities: Symmetric 5 x 5 power. Skin: No rashes,  Psychiatry: Mood & affect appropriate.      Data Reviewed:  I have personally reviewed following labs and imaging studies   CBC Lab Results  Component Value Date   WBC 7.4 03/24/2023   RBC 4.59 03/24/2023   HGB 13.6 03/24/2023   HCT 41.2 03/24/2023   MCV 89.8 03/24/2023   MCH 29.6 03/24/2023   PLT 119 (L) 03/24/2023   MCHC 33.0 03/24/2023   RDW 14.1 03/24/2023   LYMPHSABS 0.5 (L) 03/24/2023   MONOABS 0.4 03/24/2023   EOSABS 0.0 03/24/2023   BASOSABS 0.0 03/24/2023     Last metabolic panel Lab Results  Component Value Date   NA 140 03/26/2023   K 3.4 (L) 03/26/2023   CL 101 03/26/2023   CO2 27 03/26/2023   BUN 48 (H) 03/26/2023   CREATININE 0.85 03/26/2023   GLUCOSE 166 (H) 03/26/2023   GFRNONAA >60 03/26/2023   GFRAA >60 03/18/2017   CALCIUM 8.8 (L) 03/26/2023   PROT 6.9 03/23/2023   ALBUMIN 3.3 (L) 03/23/2023   BILITOT 1.3 (H) 03/23/2023   ALKPHOS 91 03/23/2023   AST 188 (H) 03/23/2023   ALT 130 (H) 03/23/2023   ANIONGAP 12 03/26/2023    CBG (last 3)  Recent Labs    03/25/23 2047 03/26/23 0747 03/26/23 1153  GLUCAP 273* 167* 331*      Coagulation Profile: Recent Labs  Lab 03/21/23 1405  INR 2.0*     Radiology Studies: No results found.      Kathlen Mody M.D. [facility] 03/26/2023, 2:52 PM  Available via Epic secure chat 7am-7pm After 7 pm, please refer to night coverage provider listed on amion.

## 2023-03-26 NOTE — Progress Notes (Signed)
Physical Therapy Treatment Patient Details Name: [name] MRN: [id number] DOB: 01-04-1946 Today's Date: 03/26/2023   History of Present Illness 78 y.o. male admitted with rhabdomyolysis after a fall at home. Pt with medical history significant for insulin-dependent type 2 diabetes, diabetic peripheral neuropathy, hyperlipidemia, persistent atrial fibrillation on anticoagulation, ischemic cardiomyopathy EF 20% currently being treated for right plantar non-healing forefoot neuropathic ulcer, R transmetatarsal amputation 2021.    PT Comments  Pt's daughter, Jae Dire, in room again today.  She reports pt is able to perform WBAT with post op shoe in place and donned for pt today.  Pt is to be NWB if not wearing shoe.  TCC was due to pt's noncompliance with post op shoe.  Pt currently requiring at least min assist for transfers and ambulation at this time.  Pt reports today that he does have trouble with his memory as well.  Pt does not appear safe to return home alone.  Patient will benefit from continued inpatient follow up therapy, <3 hours/day.    If plan is discharge home, recommend the following: A little help with walking and/or transfers;A little help with bathing/dressing/bathroom;Assistance with cooking/housework;Assist for transportation;Help with stairs or ramp for entrance   Can travel by private vehicle     Yes  Equipment Recommendations  None recommended by PT    Recommendations for Other Services       Precautions / Restrictions Precautions Precautions: Fall Required Braces or Orthoses: Other Brace Other Brace: post op shoe Restrictions Weight Bearing Restrictions Per Provider Order: Yes RLE Weight Bearing Per Provider Order: Non weight bearing Other Position/Activity Restrictions: per daughter Jae Dire, pt is NWB if not wearing post op shoe, she states pt had to have TCC because if was nonadherent to weight bearing status or wearing post op shoe; pt can be WBAT with post op shoe      Mobility  Bed Mobility Overal bed mobility: Needs Assistance Bed Mobility: Supine to Sit     Supine to sit: Supervision, Used rails     General bed mobility comments: on arrival, pt on 3L O2 [place] and SPO2 100%; removed [place] and pt on room air for a couple minutes, SPO2 remained 100%    Transfers Overall transfer level: Needs assistance Equipment used: Rolling walker (2 wheels) Transfers: Sit to/from Stand Sit to Stand: Min assist           General transfer comment: verbal cues for positioning and technique; assist to rise and control descent    Ambulation/Gait Ambulation/Gait assistance: Min assist Gait Distance (Feet): 60 Feet Assistive device: Rolling walker (2 wheels) Gait Pattern/deviations: Step-to pattern, Decreased stride length, Step-through pattern, Decreased stance time - right       General Gait Details: assist for stability and weakness; cues for use of RW and weight bearing on UEs, SpO2 91% on room air upon return to room and quickly improved to 94%, RN notified and agreeable to leave pt on room air   Stairs             Wheelchair Mobility     Tilt Bed    Modified Rankin (Stroke Patients Only)       Balance Overall balance assessment: Needs assistance, History of Falls         Standing balance support: Bilateral upper extremity supported, Reliant on assistive device for balance, During functional activity Standing balance-Leahy Scale: Poor  Cognition Arousal: Alert Behavior During Therapy: WFL for tasks assessed/performed Overall Cognitive Status: Within Functional Limits for tasks assessed                                 General Comments: Pt likes to joke, a little impulsive and does need redirection. he does report a history of poor memory        Exercises      General Comments        Pertinent Vitals/Pain Pain Assessment Pain Assessment: No/denies pain Pain  Intervention(s): Monitored during session, Repositioned    Home Living                          Prior Function            PT Goals (current goals can now be found in the care plan section) Progress towards PT goals: Progressing toward goals    Frequency    Min 1X/week      PT Plan      Co-evaluation              AM-PAC PT "6 Clicks" Mobility   Outcome Measure  Help needed turning from your back to your side while in a flat bed without using bedrails?: None Help needed moving from lying on your back to sitting on the side of a flat bed without using bedrails?: A Little Help needed moving to and from a bed to a chair (including a wheelchair)?: A Little Help needed standing up from a chair using your arms (e.g., wheelchair or bedside chair)?: A Little Help needed to walk in hospital room?: A Little Help needed climbing 3-5 steps with a railing? : A Lot 6 Click Score: 18    End of Session Equipment Utilized During Treatment: Gait belt Activity Tolerance: Patient tolerated treatment well Patient left: in chair;with call bell/phone within reach;with chair alarm set;with family/visitor present Nurse Communication: Mobility status PT Visit Diagnosis: Difficulty in walking, not elsewhere classified (R26.2);Unsteadiness on feet (R26.81)     Time: 8469-6295 PT Time Calculation (min) (ACUTE ONLY): 23 min  Charges:    $Gait Training: 23-37 mins PT General Charges $$ ACUTE PT VISIT: 1 Visit                    Thomasene Mohair PT, DPT Physical Therapist Acute Rehabilitation Services Office: [phone]    Kati L Payson 03/26/2023, 1:21 PM

## 2023-03-26 NOTE — Progress Notes (Signed)
Progress Note  Patient Name: [name] Date of Encounter: 03/26/2023  Primary Cardiologist: None   Subjective   Patient seen examined his bedside.  Inpatient Medications    Scheduled Meds:  aspirin EC  81 mg Oral Daily   dofetilide  250 mcg Oral BID   empagliflozin  25 mg Oral Daily   fluticasone  2 spray Each Nare Daily   insulin aspart  0-15 Units Subcutaneous TID WC   insulin aspart  0-5 Units Subcutaneous QHS   insulin glargine-yfgn  14 Units Subcutaneous Daily   loratadine  10 mg Oral Daily   LORazepam  0.5 mg Oral QHS   metoprolol  200 mg Oral QPC supper   polyethylene glycol  17 g Oral Daily   potassium chloride  40 mEq Oral BID   predniSONE  40 mg Oral QAC breakfast   rivaroxaban  20 mg Oral QPC supper   senna-docusate  2 tablet Oral BID   torsemide  20 mg Oral Daily   Continuous Infusions:  PRN Meds: acetaminophen **OR** acetaminophen, benzonatate, guaiFENesin-dextromethorphan, ipratropium-albuterol, LORazepam, phenol   Vital Signs    Vitals:   03/25/23 2005 03/26/23 0510 03/26/23 0512 03/26/23 0544  BP: 99/65 (!) 91/58 103/77   Pulse: 94 83 81   Resp: 17 17 20    Temp: 97.9 F (36.6 C)  98.2 F (36.8 C)   TempSrc: Oral  Oral   SpO2: 99% 93% 93%   Weight:    84.5 kg  Height:        Intake/Output Summary (Last 24 hours) at 03/26/2023 0905 Last data filed at 03/26/2023 0507 Gross per 24 hour  Intake 1680 ml  Output 4150 ml  Net -2470 ml   Filed Weights   03/24/23 0500 03/25/23 0604 03/26/23 0544  Weight: 83.5 kg 84.7 kg 84.5 kg    Telemetry    V paced rhythm- Personally Reviewed  ECG     - Personally Reviewed  Physical Exam   General: Uncomfortable in the bed, shivering Head: Atraumatic, normal size  Eyes: PEERLA, EOMI  Neck: Supple, normal JVD Cardiac: Normal S1, S2; RRR; no murmurs, rubs, or gallops Lungs: Clear to auscultation Abd: Soft, nontender, no hepatomegaly  Ext: warm, no edema Musculoskeletal: No deformities, BUE  and BLE strength normal and equal Skin: Warm and dry, no rashes   Neuro: Alert and oriented to person, place, time, and situation, CNII-XII grossly intact, no focal deficits  Psych: Normal mood and affect   Labs    Chemistry Recent Labs  Lab 03/21/23 1405 03/22/23 0517 03/23/23 1917 03/24/23 0347 03/25/23 0431 03/26/23 0414  NA 134*   < > 128* 133* 136 140  K 4.1   < > 3.5 4.0 3.9 3.4*  CL 99   < > 92* 96* 99 101  CO2 23   < > 21* 25 25 27   GLUCOSE 148*   < > 153* 161* 151* 166*  BUN 28*   < > 35* 32* 44* 48*  CREATININE 1.25*   < > 1.48* 1.21 1.04 0.85  CALCIUM 9.5   < > 7.9* 8.3* 8.8* 8.8*  PROT 7.2  --  6.9  --   --   --   ALBUMIN 3.7  --  3.3*  --   --   --   AST 43*  --  188*  --   --   --   ALT 17  --  130*  --   --   --  ALKPHOS 62  --  91  --   --   --   BILITOT 0.8  --  1.3*  --   --   --   GFRNONAA >60   < > 49* >60 >60 >60  ANIONGAP 12   < > 15 12 12 12    < > = values in this interval not displayed.     Hematology Recent Labs  Lab 03/23/23 0510 03/23/23 1917 03/24/23 0347  WBC 10.0 11.5* 7.4  RBC 5.05 4.62 4.59  HGB 15.0 13.7 13.6  HCT 46.3 41.6 41.2  MCV 91.7 90.0 89.8  MCH 29.7 29.7 29.6  MCHC 32.4 32.9 33.0  RDW 14.4 14.4 14.1  PLT 137* 136* 119*    Cardiac EnzymesNo results for input(s): "TROPONINI" in the last 168 hours. No results for input(s): "TROPIPOC" in the last 168 hours.   BNP Recent Labs  Lab 03/24/23 0347  BNP 771.7*     DDimer No results for input(s): "DDIMER" in the last 168 hours.   Radiology    No results found.  Cardiac Studies     Patient Profile     78 y.o. male admitted for heart failure exacerbation  Assessment & Plan    Paroxysmal atrial fibrillation CAD Dilated cardiomyopathy status post BiV ICD Rhabdomyolysis Due to prolongation  EKG reviewed despite corrected QTc it still is above 500 ms.  Will hold his Tikosyn today, repeat EKG tomorrow.  Spoke with patient and his daughter at the  bedside. PAF-V-paced today, continue his metoprolol as well as his Xarelto. Coronary artery disease no angina reported. Dilated cardiomyopathy-most recent EF 20%, he is only on metoprolol succinate 200 mg and Jardiance at this time.  At this time.  His blood pressure is on the lower side I will not be able to tolerate Entresto.  Plan to restart once blood pressure improved slightly.      For questions or updates, please contact [facility] Please consult [email or web address] for contact info under Cardiology/STEMI.      Signed, Alvilda Mckenna, DO  03/26/2023, 9:05 AM

## 2023-03-26 NOTE — Progress Notes (Deleted)
Marland Kitchen  kt

## 2023-03-27 ENCOUNTER — Encounter: Payer: Self-pay | Admitting: Internal Medicine

## 2023-03-27 ENCOUNTER — Encounter (HOSPITAL_BASED_OUTPATIENT_CLINIC_OR_DEPARTMENT_OTHER): Payer: Medicare Other | Admitting: General Surgery

## 2023-03-27 ENCOUNTER — Ambulatory Visit: Payer: Medicare Other | Admitting: Internal Medicine

## 2023-03-27 DIAGNOSIS — I5043 Acute on chronic combined systolic (congestive) and diastolic (congestive) heart failure: Secondary | ICD-10-CM

## 2023-03-27 DIAGNOSIS — I4819 Other persistent atrial fibrillation: Secondary | ICD-10-CM | POA: Diagnosis not present

## 2023-03-27 DIAGNOSIS — I484 Atypical atrial flutter: Secondary | ICD-10-CM

## 2023-03-27 DIAGNOSIS — R748 Abnormal levels of other serum enzymes: Secondary | ICD-10-CM | POA: Diagnosis not present

## 2023-03-27 DIAGNOSIS — I255 Ischemic cardiomyopathy: Secondary | ICD-10-CM | POA: Diagnosis not present

## 2023-03-27 DIAGNOSIS — T796XXD Traumatic ischemia of muscle, subsequent encounter: Secondary | ICD-10-CM | POA: Diagnosis not present

## 2023-03-27 DIAGNOSIS — I951 Orthostatic hypotension: Secondary | ICD-10-CM | POA: Diagnosis not present

## 2023-03-27 LAB — BASIC METABOLIC PANEL
Anion gap: 12 (ref 5–15)
Anion gap: 13 (ref 5–15)
BUN: 54 mg/dL — ABNORMAL HIGH (ref 8–23)
BUN: 56 mg/dL — ABNORMAL HIGH (ref 8–23)
CO2: 28 mmol/L (ref 22–32)
CO2: 30 mmol/L (ref 22–32)
Calcium: 9.3 mg/dL (ref 8.9–10.3)
Calcium: 9.5 mg/dL (ref 8.9–10.3)
Chloride: 94 mmol/L — ABNORMAL LOW (ref 98–111)
Chloride: 92 mmol/L — ABNORMAL LOW (ref 98–111)
Creatinine, Ser: 1.15 mg/dL (ref 0.61–1.24)
Creatinine, Ser: 1.07 mg/dL (ref 0.61–1.24)
GFR, Estimated: >60 mL/min (ref >60)
GFR, Estimated: >60 mL/min (ref >60)
Glucose, Bld: 144 mg/dL — ABNORMAL HIGH (ref 70–99)
Glucose, Bld: 191 mg/dL — ABNORMAL HIGH (ref 70–99)
Potassium: 3.8 mmol/L (ref 3.5–5.1)
Potassium: 3.6 mmol/L (ref 3.5–5.1)
Sodium: 134 mmol/L — ABNORMAL LOW (ref 135–145)
Sodium: 135 mmol/L (ref 135–145)

## 2023-03-27 LAB — HEPATIC FUNCTION PANEL
ALT: 105 U/L — ABNORMAL HIGH (ref 0–44)
AST: 52 U/L — ABNORMAL HIGH (ref 15–41)
Albumin: 3.2 g/dL — ABNORMAL LOW (ref 3.5–5.0)
Alkaline Phosphatase: 108 U/L (ref 38–126)
Bilirubin, Direct: 0.1 mg/dL (ref 0.0–0.2)
Indirect Bilirubin: 1.1 mg/dL — ABNORMAL HIGH (ref 0.3–0.9)
Total Bilirubin: 1.2 mg/dL (ref 0.0–1.2)
Total Protein: 7.1 g/dL (ref 6.5–8.1)

## 2023-03-27 LAB — CBC WITH DIFFERENTIAL/PLATELET
Abs Immature Granulocytes: 0.07 10*3/uL (ref 0.00–0.07)
Basophils Absolute: 0 10*3/uL (ref 0.0–0.1)
Basophils Relative: 0 %
Eosinophils Absolute: 0 10*3/uL (ref 0.0–0.5)
Eosinophils Relative: 0 %
HCT: 43.7 % (ref 39.0–52.0)
Hemoglobin: 14.2 g/dL (ref 13.0–17.0)
Immature Granulocytes: 1 %
Lymphocytes Relative: 13 %
Lymphs Abs: 1.5 10*3/uL (ref 0.7–4.0)
MCH: 29 pg (ref 26.0–34.0)
MCHC: 32.5 g/dL (ref 30.0–36.0)
MCV: 89.2 fL (ref 80.0–100.0)
Monocytes Absolute: 1.3 10*3/uL — ABNORMAL HIGH (ref 0.1–1.0)
Monocytes Relative: 12 %
Neutro Abs: 8.2 10*3/uL — ABNORMAL HIGH (ref 1.7–7.7)
Neutrophils Relative %: 74 %
Platelets: 171 10*3/uL (ref 150–400)
RBC: 4.9 MIL/uL (ref 4.22–5.81)
RDW: 14 % (ref 11.5–15.5)
WBC: 11.1 10*3/uL — ABNORMAL HIGH (ref 4.0–10.5)
nRBC: 0 % (ref 0.0–0.2)

## 2023-03-27 LAB — GLUCOSE, CAPILLARY
Glucose-Capillary: 192 mg/dL — ABNORMAL HIGH (ref 70–99)
Glucose-Capillary: 216 mg/dL — ABNORMAL HIGH (ref 70–99)
Glucose-Capillary: 194 mg/dL — ABNORMAL HIGH (ref 70–99)
Glucose-Capillary: 258 mg/dL — ABNORMAL HIGH (ref 70–99)

## 2023-03-27 MED ORDER — MAGNESIUM OXIDE -MG SUPPLEMENT 400 (240 MG) MG PO TABS
400.0000 mg | ORAL_TABLET | Freq: Two times a day (BID) | ORAL | Status: DC
  Administered 2023-03-27 – 2023-04-01 (×11): 400 mg via ORAL
  Filled 2023-03-27 (×11): qty 1

## 2023-03-27 MED ORDER — POTASSIUM CHLORIDE CRYS ER 20 MEQ PO TBCR
40.0000 meq | EXTENDED_RELEASE_TABLET | Freq: Once | ORAL | Status: AC
Start: 2023-03-27 — End: 2023-03-27
  Administered 2023-03-27: 40 meq via ORAL
  Filled 2023-03-27: qty 2

## 2023-03-27 MED ORDER — POTASSIUM CHLORIDE CRYS ER 20 MEQ PO TBCR
40.0000 meq | EXTENDED_RELEASE_TABLET | Freq: Once | ORAL | Status: AC
Start: 2023-03-27 — End: 2023-03-27
  Administered 2023-03-27: 40 meq via ORAL
  Filled 2023-03-27: qty 4

## 2023-03-27 MED ORDER — SPIRONOLACTONE 25 MG PO TABS
25.0000 mg | ORAL_TABLET | Freq: Every day | ORAL | Status: DC
  Administered 2023-03-27 – 2023-04-01 (×6): 25 mg via ORAL
  Filled 2023-03-27 (×6): qty 1

## 2023-03-27 MED ORDER — INSULIN ASPART 100 UNIT/ML IJ SOLN
4.0000 [IU] | Freq: Three times a day (TID) | INTRAMUSCULAR | Status: DC
  Administered 2023-03-27 – 2023-04-01 (×14): 4 [IU] via SUBCUTANEOUS

## 2023-03-27 MED ORDER — ZOLPIDEM TARTRATE 5 MG PO TABS
5.0000 mg | ORAL_TABLET | Freq: Every evening | ORAL | Status: DC | PRN
  Administered 2023-03-27 – 2023-03-31 (×4): 5 mg via ORAL
  Filled 2023-03-27 (×5): qty 1

## 2023-03-27 NOTE — Progress Notes (Signed)
Mobility Specialist - Progress Note   03/27/23 1326  Mobility  Activity Ambulated with assistance in hallway  Level of Assistance Contact guard assist, steadying assist  Assistive Device Front wheel walker  Distance Ambulated (ft) 100 ft  Range of Motion/Exercises Active  RLE Weight Bearing Per Provider Order WBAT  Activity Response Tolerated well  Mobility Referral Yes  Mobility visit 1 Mobility  Mobility Specialist Start Time (ACUTE ONLY) 1308  Mobility Specialist Stop Time (ACUTE ONLY) 1326  Mobility Specialist Time Calculation (min) (ACUTE ONLY) 18 min   Pt was found on recliner chair and agreeable to ambulate. No complaints with session. Was wheeled back to room after ~135ft. At EOS was left in bed with all needs met. Call bell in reach and family in room.  Billey Chang Mobility Specialist

## 2023-03-27 NOTE — Plan of Care (Signed)
Problem: Coping: Goal: Ability to adjust to condition or change in health will improve Outcome: Progressing   Problem: Fluid Volume: Goal: Ability to maintain a balanced intake and output will improve Outcome: Progressing   Problem: Nutritional: Goal: Maintenance of adequate nutrition will improve Outcome: Progressing Goal: Progress toward achieving an optimal weight will improve Outcome: Progressing

## 2023-03-27 NOTE — Progress Notes (Signed)
[facility]                                                                               Jasraj Lappe, is a 78 y.o. male, DOB - 08-30-1945, WUJ:[id number] Admit date - 03/21/2023    Outpatient Primary MD for the patient is Corwin Levins, MD  LOS - 5  days    Brief summary    [name] is a 78 y.o. male with medical history significant for insulin-dependent type 2 diabetes, diabetic peripheral neuropathy, hyperlipidemia, persistent atrial fibrillation on anticoagulation, ischemic cardiomyopathy EF 20% currently being treated for right lower extremity diabetic wound and is being admitted to the hospital with rhabdomyolysis after a fall at home today.   He has been followed by Dr. Lady Gary for a right lower extremity diabetic wound, recent outpatient cultures grew Proteus and he has been placed on oral Levaquin.  Workup in the emergency department is actually quite benign, though he is significantly hypotensive, and has elevated CK.  He was admitted for hypotension and rhabdomyolysis. Hospital course significant for hyperkalemia, AKI and acute respiratory failure with hypoxia secondary to meta pneumovirus infection and acute CHF.     Assessment & Plan    Assessment and Plan:  Acute respiratory failure with hypoxia secondary to a combination acute on chronic systolic failure and meta pneumovirus infection.   Pt had a respiratory event on 1/23 with diffuse wheezing , dyspnea, requiring up to 6 lit/min of [place] oxygen.  Respiratory panel came back positive for meta pneumovirus infection.  COVID is negative.  CXR showing improving pulmonary edema.  ABG shows hypoxia.  He was started on IV solumedrol 60 mg daily, transition to prednisone on a tapering dose.   Oxygen weaned off.  Meanwhile continue with duonebs.  Continue with torsemide.    Rhabdomyolysis:   Much improved.   In view of h/o severe ischemic cardiomyopathy with EF of 20%, we stopped the IV fluids.  Ck  levels wnl.    Orthostatic hypotension:  Elevated lactic acid/ lactic acidosis resolved.    Generalized Weakness and falls suspect this may be due to overdiuresis due to some medication errors as described by his daughter (sometimes accidentally takes double doses of medications) and resulting dehydration with hypotension and meta pneumovirus infection.  Therapy evaluations ordered and pending.    AKI: From fluid overload and hypoperfusion to kidneys.  Creatinine around 1.    Hyponatremia:  Suspect from fluid overload.  Resolved.    Elevated liver enzymes:  Unclear etiology. Pt denies any nausea, vomiting or abdominal pain.  ? Congestion from CHF. Check liver panel in am.  Monitor.    Diabetic foot wound on the right. X-ray today without evidence of osteomyelitis or other complication.  Follows up with Dr Lady Gary as outpatient.  Completed 10 day course of antibiotics.  Wound care on board.   Insulin Dependent Type 2 DM uncontrolled with hyperglycemia from steroids.  CBG (last 3)  Recent Labs    03/26/23 2100 03/27/23 0742 03/27/23 1110  GLUCAP 207* 192* 216*   Resume SSI. Increase to 4 units TIDAC if he consumes >50% of his meals.  A1c is 7.2%.  Hyperlipidemia Holding crestor for rhabdomyolysis.  Restart on discharge.    Atrial fibrillation Rate controlled. With metoprolol. Continue with xarelto.  Tikosyn initially held for hyperkalemia, rhabdomyolysis AND qtc >500.  Cardiology on board and appreciate recommendations.  Keep potassium greater than 4 and magnesium >2.    Hyperkalemia Resolved.    Anxiety  Resume ativan.      Estimated body mass index is 26.73 kg/m as calculated from the following:   Height as of this encounter: 5\' 10"  (1.778 m).   Weight as of this encounter: 84.5 kg.  Code Status: full code. DVT Prophylaxis:  SCDs Start: 03/21/23 1728 rivaroxaban (XARELTO) tablet 20 mg   Level of Care: Level of care: Progressive Family  Communication: none at bedside.   Disposition Plan:     Remains inpatient appropriate:  pending clinical improvement.   Procedures:  None.   Consultants:   Cardiology.  Antimicrobials:   Anti-infectives (From admission, onward)    Start     Dose/Rate Route Frequency Ordered Stop   03/22/23 0500  ceFEPIme (MAXIPIME) 2 g in sodium chloride 0.9 % 100 mL IVPB        2 g 200 mL/hr over 30 Minutes Intravenous Every 12 hours 03/21/23 1811 03/24/23 1740   03/21/23 1345  ceFEPIme (MAXIPIME) 2 g in sodium chloride 0.9 % 100 mL IVPB        2 g 200 mL/hr over 30 Minutes Intravenous  Once 03/21/23 1338 03/21/23 1500        Medications  Scheduled Meds:  aspirin EC  81 mg Oral Daily   empagliflozin  25 mg Oral Daily   fluticasone  2 spray Each Nare Daily   insulin aspart  0-15 Units Subcutaneous TID WC   insulin aspart  0-5 Units Subcutaneous QHS   insulin aspart  2 Units Subcutaneous TID WC   insulin glargine-yfgn  14 Units Subcutaneous Daily   loratadine  10 mg Oral Daily   LORazepam  0.5 mg Oral QHS   metoprolol  200 mg Oral QPC supper   polyethylene glycol  17 g Oral Daily   predniSONE  40 mg Oral QAC breakfast   rivaroxaban  20 mg Oral QPC supper   senna-docusate  2 tablet Oral BID   torsemide  20 mg Oral Daily   Continuous Infusions:   PRN Meds:.acetaminophen **OR** acetaminophen, benzonatate, guaiFENesin-dextromethorphan, ipratropium-albuterol, LORazepam, phenol, zolpidem    Subjective:   [name] was seen and examined today. No new complaints.   Objective:   Vitals:   03/26/23 1755 03/26/23 2104 03/27/23 0442 03/27/23 1214  BP: 114/75 113/74 108/78 110/78  Pulse: (!) 101 96 100 99  Resp:  20 19 20   Temp:  98 F (36.7 C) (!) 97.5 F (36.4 C)   TempSrc:  Oral Oral   SpO2:  96% 96% 96%  Weight:      Height:        Intake/Output Summary (Last 24 hours) at 03/27/2023 1217 Last data filed at 03/27/2023 1100 Gross per 24 hour  Intake 960 ml  Output 3600  ml  Net -2640 ml   Filed Weights   03/24/23 0500 03/25/23 0604 03/26/23 0544  Weight: 83.5 kg 84.7 kg 84.5 kg     Exam  General exam: Appears calm and comfortable  Respiratory system: Clear to auscultation. Respiratory effort normal. Cardiovascular system: S1 & S2 heard, RRR. Gastrointestinal system: Abdomen is nondistended, soft and nontender.  Central nervous system: Alert and oriented.  Extremities: Symmetric 5 x  5 power. Skin: No rashes,  Psychiatry:  Mood & affect appropriate.       Data Reviewed:  I have personally reviewed following labs and imaging studies   CBC Lab Results  Component Value Date   WBC 11.1 (H) 03/27/2023   RBC 4.90 03/27/2023   HGB 14.2 03/27/2023   HCT 43.7 03/27/2023   MCV 89.2 03/27/2023   MCH 29.0 03/27/2023   PLT 171 03/27/2023   MCHC 32.5 03/27/2023   RDW 14.0 03/27/2023   LYMPHSABS 1.5 03/27/2023   MONOABS 1.3 (H) 03/27/2023   EOSABS 0.0 03/27/2023   BASOSABS 0.0 03/27/2023     Last metabolic panel Lab Results  Component Value Date   NA 134 (L) 03/27/2023   K 3.8 03/27/2023   CL 94 (L) 03/27/2023   CO2 28 03/27/2023   BUN 54 (H) 03/27/2023   CREATININE 1.15 03/27/2023   GLUCOSE 144 (H) 03/27/2023   GFRNONAA >60 03/27/2023   GFRAA >60 03/18/2017   CALCIUM 9.3 03/27/2023   PROT 6.7 03/26/2023   ALBUMIN 3.2 (L) 03/26/2023   BILITOT 0.9 03/26/2023   ALKPHOS 109 03/26/2023   AST 86 (H) 03/26/2023   ALT 128 (H) 03/26/2023   ANIONGAP 12 03/27/2023    CBG (last 3)  Recent Labs    03/26/23 2100 03/27/23 0742 03/27/23 1110  GLUCAP 207* 192* 216*      Coagulation Profile: Recent Labs  Lab 03/21/23 1405  INR 2.0*     Radiology Studies: No results found.      Kathlen Mody M.D. [facility] 03/27/2023, 12:17 PM  Available via Epic secure chat 7am-7pm After 7 pm, please refer to night coverage provider listed on amion.

## 2023-03-27 NOTE — TOC Progression Note (Signed)
Transition of Care [facility]) - Progression Note   Patient Details  Name: [name] MRN: [id number] Date of Birth: 11-14-1945  [facility] CM/SW Contact  Ewing Schlein, LCSW Phone Number: 03/27/2023, 10:28 AM  Clinical Narrative: CSW followed up with Tresa Endo in admissions at [facility]. Per Tresa Endo, a bed is expected to be available 03/29/23 but she will notify CSW if bed will be available tomorrow. CSW left voicemail for ex-wife, Mong Neal, regarding update.    Expected Discharge Plan: Skilled Nursing Facility Barriers to Discharge: SNF Pending bed offer, Continued Medical Work up  Expected Discharge Plan and Services In-house Referral: Clinical Social Work Discharge Planning Services: Other - See comment (SNF) Living arrangements for the past 2 months: Single Family Home DME Agency: NA  Social Determinants of Health (SDOH) Interventions SDOH Screenings   Food Insecurity: No Food Insecurity (03/21/2023)  Housing: Low Risk  (03/21/2023)  Transportation Needs: No Transportation Needs (03/21/2023)  Utilities: Not At Risk (03/21/2023)  Alcohol Screen: Low Risk  (03/18/2022)  Depression (PHQ2-9): Low Risk  (08/24/2022)  Financial Resource Strain: Low Risk  (03/18/2022)  Physical Activity: Inactive (03/18/2022)  Social Connections: Socially Isolated (03/21/2023)  Stress: No Stress Concern Present (03/18/2022)  Tobacco Use: Low Risk  (03/21/2023)   Readmission Risk Interventions    03/23/2023    1:14 PM  Readmission Risk Prevention Plan  Transportation Screening Complete  PCP or Specialist Appt within 5-7 Days Complete  Home Care Screening Complete  Medication Review (RN CM) Complete

## 2023-03-27 NOTE — Progress Notes (Signed)
Patient Name: [name] Date of Encounter: 03/27/2023  HeartCare Cardiologist: Pernell Dupre [facility])  Interval Summary  .    Feeling better.  Able to transfer from bed to chair slowly, but feels very weak.  Denies dyspnea. He has been in atrial fibrillation for the last roughly 48 hours.  This has dramatically reduced the efficiency of biventricular pacing.  No weight today.  Weight was 186.2-186.7 pounds for the last 2 days.  At his last office visit with Dr. Pernell Dupre at the cardiology clinic in [facility] he weighed 186 pounds and 9 ounces.  Vital Signs .    Vitals:   03/26/23 1755 03/26/23 2104 03/27/23 0442 03/27/23 1214  BP: 114/75 113/74 108/78 110/78  Pulse: (!) 101 96 100 99  Resp:  20 19 20   Temp:  98 F (36.7 C) (!) 97.5 F (36.4 C) (!) 97.5 F (36.4 C)  TempSrc:  Oral Oral Oral  SpO2:  96% 96% 96%  Weight:      Height:        Intake/Output Summary (Last 24 hours) at 03/27/2023 1245 Last data filed at 03/27/2023 1100 Gross per 24 hour  Intake 960 ml  Output 3600 ml  Net -2640 ml      03/26/2023    5:44 AM 03/25/2023    6:04 AM 03/24/2023    5:00 AM  Last 3 Weights  Weight (lbs) 186 lb 4.6 oz 186 lb 11.7 oz 184 lb 1.4 oz  Weight (kg) 84.5 kg 84.7 kg 83.5 kg      Telemetry/ECG    Atrial fibrillation with mild RVR, frequent native conducted beats, biventricular pacing.- Personally Reviewed  Twelve-lead ECG earlier today calculates a QTc of 572 ms, but hard to measure accurately with arrhythmia.  Did a comprehensive download of his device in his room today. He made a generator longevity 7 months.  All lead parameters are excellent. Only has 89% biventricular pacing efficiency, largely due to an episode of persistent atrial flutter that has been going on since 03/25/2023.  Before that his burden of arrhythmia was very low, well under 1%. OptiVol shows that he started accumulating fluid in late December, exceeded threshold around the beginning of  January.  Still above threshold now. Using atrial overdrive pacing we were able to terminate the atrial flutter with only 1 attempt.  Physical Exam .   GEN: No acute distress.   Neck: No JVD Cardiac: Healthy pacemaker site, frequent ectopy on a background of RRR, no murmurs, rubs, or gallops.  Respiratory: Clear to auscultation bilaterally. GI: Soft, nontender, non-distended  MS: No edema  Assessment & Plan .     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    He remains weak, but is improving every day.  He appears to be euvolemic clinically.  His weight is identical to his weight at the office visit in November.  Renal parameters and electrolytes are normal.  His thoracic impedance (OptiVol) is trending back to normal range although it has not reached that yet.  When he came to the hospital the rhythm was atrial sensed (sinus), biventricular paced with a QTc of 607 ms.  Dofetilide was discontinued due to excessive QT interval prolongation.  Dofetilide was stopped several days ago and he has now been in persistent  atrial flutter for the last 48 hours, with marked reduction in the efficiency of biventricular pacing.  We were successful in overdrive pacing the atrial flutter today, but he has very frequent ectopy suggesting a high risk of recurrence of the arrhythmia.  His QT interval is indeed prolonged, but this is expected to some degree with the paced ventricular rhythm.  I am unable to physically review his ECG  tracings from [facility], but the official report from 03/31/2022 showed a QT interval of 524 ms, QTc 600 ms, longer than we have calculated today.  I think that he is at very high likelihood  of A-fib/a flutter recurrence without the dofetilide.  In turn, the atrial arrhythmia can lead to heart failure exacerbation.  Will need to restart the dofetilide.  In order to do this safely he will have to have a potassium of at least 4.0 and magnesium of at least 2.0 and it will take 3 days to safely resume the medication with QT monitoring.  This would be better performed at [facility] on the cardiac progressive care unit.  For questions or updates, please contact [facility] Please consult [email or web address] for contact info under        Signed, Thurmon Fair, MD

## 2023-03-27 NOTE — Telephone Encounter (Signed)
The fax number Is [phone]   But I am not sure what they are asking, as I dont see any testing done dec 2024 on the chart to provide them

## 2023-03-27 NOTE — Inpatient Diabetes Management (Signed)
Inpatient Diabetes Program Recommendations  AACE/ADA: New Consensus Statement on Inpatient Glycemic Control (2015)  Target Ranges:  Prepandial:   less than 140 mg/dL      Peak postprandial:   less than 180 mg/dL (1-2 hours)      Critically ill patients:  140 - 180 mg/dL   Lab Results  Component Value Date   GLUCAP 216 (H) 03/27/2023   HGBA1C 7.2 (A) 11/21/2022    Review of Glycemic Control  Latest Reference Range & Units 03/26/23 07:47 03/26/23 11:53 03/26/23 16:49 03/26/23 21:00 03/27/23 07:42 03/27/23 11:10  Glucose-Capillary 70 - 99 mg/dL 161 (H) 096 (H) 045 (H) 207 (H) 192 (H) 216 (H)  (H): Data is abnormally high  Diabetes history: DM2 Outpatient Diabetes medications:  Lantus 14 units every day Humalog 11 units TID Jardiance 25 mg every day Victoza 1.8 mg every day Freestyle Libre 2 Current orders for Inpatient glycemic control:  Semglee 14 units every day Novolog 0-15 units TID and 0-5 units at bedtime Novolog 2 units TID Prednisone 40 mg QAM  Inpatient Diabetes Program Recommendations:    Please consider increasing meal coverage:  Novolog 4 units TID if he consumes at least 50%  Will continue to follow while inpatient.  Thank you, Dulce Sellar, MSN, CDCES Diabetes Coordinator Inpatient Diabetes Program [phone] (team pager from 8a-5p)

## 2023-03-28 DIAGNOSIS — I951 Orthostatic hypotension: Secondary | ICD-10-CM | POA: Diagnosis not present

## 2023-03-28 DIAGNOSIS — I5042 Chronic combined systolic (congestive) and diastolic (congestive) heart failure: Secondary | ICD-10-CM | POA: Diagnosis not present

## 2023-03-28 DIAGNOSIS — I480 Paroxysmal atrial fibrillation: Secondary | ICD-10-CM | POA: Diagnosis not present

## 2023-03-28 DIAGNOSIS — I255 Ischemic cardiomyopathy: Secondary | ICD-10-CM | POA: Diagnosis not present

## 2023-03-28 DIAGNOSIS — T796XXD Traumatic ischemia of muscle, subsequent encounter: Secondary | ICD-10-CM | POA: Diagnosis not present

## 2023-03-28 DIAGNOSIS — R748 Abnormal levels of other serum enzymes: Secondary | ICD-10-CM | POA: Diagnosis not present

## 2023-03-28 LAB — MRSA NEXT GEN BY PCR, NASAL: MRSA by PCR Next Gen: NOT DETECTED

## 2023-03-28 LAB — BASIC METABOLIC PANEL WITH GFR
Anion gap: 13 (ref 5–15)
BUN: 60 mg/dL — ABNORMAL HIGH (ref 8–23)
CO2: 29 mmol/L (ref 22–32)
Calcium: 9.4 mg/dL (ref 8.9–10.3)
Chloride: 90 mmol/L — ABNORMAL LOW (ref 98–111)
Creatinine, Ser: 1.16 mg/dL (ref 0.61–1.24)
GFR, Estimated: >60 mL/min
Glucose, Bld: 176 mg/dL — ABNORMAL HIGH (ref 70–99)
Potassium: 4.4 mmol/L (ref 3.5–5.1)
Sodium: 132 mmol/L — ABNORMAL LOW (ref 135–145)

## 2023-03-28 LAB — MAGNESIUM: Magnesium: 2.4 mg/dL (ref 1.7–2.4)

## 2023-03-28 LAB — GLUCOSE, CAPILLARY
Glucose-Capillary: 157 mg/dL — ABNORMAL HIGH (ref 70–99)
Glucose-Capillary: 170 mg/dL — ABNORMAL HIGH (ref 70–99)
Glucose-Capillary: 285 mg/dL — ABNORMAL HIGH (ref 70–99)
Glucose-Capillary: 141 mg/dL — ABNORMAL HIGH (ref 70–99)

## 2023-03-28 MED ORDER — CHLORHEXIDINE GLUCONATE CLOTH 2 % EX PADS
6.0000 | MEDICATED_PAD | Freq: Every day | CUTANEOUS | Status: DC
  Administered 2023-03-28 – 2023-04-01 (×5): 6 via TOPICAL

## 2023-03-28 MED ORDER — ORAL CARE MOUTH RINSE: 15.0000 mL | OROMUCOSAL | Status: DC | PRN

## 2023-03-28 MED ORDER — DOFETILIDE 250 MCG PO CAPS
500.0000 ug | ORAL_CAPSULE | Freq: Two times a day (BID) | ORAL | Status: DC
  Filled 2023-03-28 (×2): qty 2

## 2023-03-28 MED ORDER — DOFETILIDE 250 MCG PO CAPS
500.0000 ug | ORAL_CAPSULE | Freq: Two times a day (BID) | ORAL | Status: DC
  Administered 2023-03-28: 500 ug via ORAL
  Filled 2023-03-28 (×2): qty 2

## 2023-03-28 NOTE — Progress Notes (Signed)
Occupational Therapy Treatment Patient Details Name: [name] MRN: [id number] DOB: [birth year] Today's Date: 03/28/2023   History of present illness 78 y.o. male admitted with rhabdomyolysis after a fall at home. Pt with medical history significant for insulin-dependent type 2 diabetes, diabetic peripheral neuropathy, hyperlipidemia, persistent atrial fibrillation on anticoagulation, ischemic cardiomyopathy EF 20% currently being treated for right plantar non-healing forefoot neuropathic ulcer, R transmetatarsal amputation 2021.   OT comments  Pt agreeable to participate in OT treatment focusing on establishing BUE strengthening HEP utilizing red theraband. OT provided visual demonstration and verbal cues for proper form and technique. Pt able to demonstrate understanding. Handout provided for reference. Noted once pt returned to supine from sitting on EOB there was blood on chuck pad. Unable to locate source of blood and nursing was notified. Patient will benefit from continued inpatient follow up therapy, <3 hours/day. OT will continue to follow pt acutely.        If plan is discharge home, recommend the following:  Supervision due to cognitive status;A little help with walking and/or transfers;A little help with bathing/dressing/bathroom   Equipment Recommendations  Other (comment) (defer to next venue of care)       Precautions / Restrictions Precautions Precautions: Fall Required Braces or Orthoses: Other Brace Other Brace: post op shoe RLE Restrictions Weight Bearing Restrictions Per Provider Order: Yes RLE Weight Bearing Per Provider Order: Weight bearing as tolerated Other Position/Activity Restrictions: WBAT only with post op shoe on otherwise NWB without.       Mobility Bed Mobility Overal bed mobility: Needs Assistance Bed Mobility: Supine to Sit, Sit to Supine     Supine to sit: HOB elevated, Used rails, Min assist Sit to supine: Supervision   General bed  mobility comments: increased time and effort to complete task. Assist to bring trunk up off HOB. Pt was able to scoot towards HOB while seated on EOB.         Balance Overall balance assessment: Needs assistance, History of Falls Sitting-balance support: No upper extremity supported, Feet supported Sitting balance-Leahy Scale: Good Sitting balance - Comments: Completed UB strengthening while seated EOB.             ADL either performed or assessed with clinical judgement      Cognition Arousal: Alert Behavior During Therapy: WFL for tasks assessed/performed Overall Cognitive Status: Within Functional Limits for tasks assessed          Exercises General Exercises - Upper Extremity Shoulder ABduction: Strengthening, Both, 10 reps, Seated, Theraband Theraband Level (Shoulder Abduction): Level 2 (Red) Shoulder ADduction: Strengthening, Both, 10 reps, Seated, Theraband Theraband Level (Shoulder Adduction): Level 2 (Red) Shoulder Horizontal ABduction: Strengthening, Both, 10 reps, Seated, Theraband Theraband Level (Shoulder Horizontal Abduction): Level 2 (Red) Shoulder Horizontal ADduction: Strengthening, Both, 10 reps, Seated, Theraband Theraband Level (Shoulder Horizontal Adduction): Level 2 (Red) Elbow Flexion: Strengthening, Both, 10 reps, Seated, Theraband Theraband Level (Elbow Flexion): Level 2 (Red) Elbow Extension: Strengthening, Both, 10 reps, Seated, Theraband Theraband Level (Elbow Extension): Level 2 (Red) Shoulder Exercises Shoulder External Rotation: Strengthening, Both, 10 reps, Seated, Theraband Theraband Level (Shoulder External Rotation): Level 2 (Red) Other Exercises Other Exercises: seated, BUE, PNF pattern, red band, 10X, 1 set.       General Comments VSS on RA    Pertinent Vitals/ Pain       Pain Assessment Pain Assessment: No/denies pain Pain Score: 0-No pain         Frequency  Min 1X/week  Progress Toward Goals  OT  Goals(current goals can now be found in the care plan section)  Progress towards OT goals: Progressing toward goals            AM-PAC OT "6 Clicks" Daily Activity     Outcome Measure   Help from another person eating meals?: None Help from another person taking care of personal grooming?: A Little Help from another person toileting, which includes using toliet, bedpan, or urinal?: A Lot Help from another person bathing (including washing, rinsing, drying)?: A Lot Help from another person to put on and taking off regular upper body clothing?: A Little Help from another person to put on and taking off regular lower body clothing?: A Lot 6 Click Score: 16    End of Session    OT Visit Diagnosis: Unsteadiness on feet (R26.81);Dizziness and giddiness (R42);History of falling (Z91.81);Muscle weakness (generalized) (M62.81);Repeated falls (R29.6)   Activity Tolerance Patient tolerated treatment well   Patient Left in bed;with call bell/phone within reach;with bed alarm set;with family/visitor present   Nurse Communication Other (comment) (blood noted on chuck pad with no known source.)        Time: 1610-9604 OT Time Calculation (min): 37 min  Charges: OT General Charges $OT Visit: 1 Visit OT Treatments $Therapeutic Exercise: 23-37 mins  Limmie Patricia, OTR/L,CBIS  Supplemental OT - [facility] and [facility] Secure Chat Preferred    Janellie Tennison, Charisse March 03/28/2023, 9:37 PM

## 2023-03-28 NOTE — Progress Notes (Signed)
PT Cancellation Note  Patient Details Name: [name] MRN: [id number] DOB: May 03, 1945   Cancelled Treatment:    Reason Eval/Treat Not Completed: Medical issues which prohibited therapy. Pt transitioned to step down and not medically appropriate for PT at this time. PT to continue to follow acutely.   Johnny Bridge, PT Acute Rehab   Jacqualyn Posey 03/28/2023, 4:48 PM

## 2023-03-28 NOTE — Progress Notes (Signed)
Patient Name: [name] Date of Encounter: 03/28/2023 [facility] Cardiologist: None ([facility])  Interval Summary  .    He has no cardiovascular complaints.  Able to lie fully supine in bed without dyspnea. On telemetry his rhythm is irregular again, with reduction in the frequency of ventricular pacing, cannot tell if this is just due to very frequent PACs or if he is back in atrial flutter or atrial fibrillation.  Vital Signs .    Vitals:   03/27/23 0442 03/27/23 1214 03/27/23 1759 03/27/23 1934  BP: 108/78 110/78 117/87 118/76  Pulse: 100 99 76 74  Resp: 19 20  16   Temp: (!) 97.5 F (36.4 C) (!) 97.5 F (36.4 C)  98.2 F (36.8 C)  TempSrc: Oral Oral  Oral  SpO2: 96% 96%  95%  Weight:      Height:        Intake/Output Summary (Last 24 hours) at 03/28/2023 1236 Last data filed at 03/28/2023 1114 Gross per 24 hour  Intake 240 ml  Output 2150 ml  Net -1910 ml      03/26/2023    5:44 AM 03/25/2023    6:04 AM 03/24/2023    5:00 AM  Last 3 Weights  Weight (lbs) 186 lb 4.6 oz 186 lb 11.7 oz 184 lb 1.4 oz  Weight (kg) 84.5 kg 84.7 kg 83.5 kg      Telemetry/ECG  Reviewed telemetry and today's twelve-lead ECG.  It is hard to tell what the atrial mechanism is, but I think he is back in atrial fibrillation or the same atrial flutter he had yesterday on telemetry. On the ECG performed at 6 AM he was probably still in atrial paced-biventricular paced rhythm with frequent PVCs and/or natively conducted PACs (atrial pacing at 90 bpm due to atrial preference pacing). Suspect he is back in atrial fibrillation or atrial flutter, biventricular pacing occurs about two thirds of the time- Personally Reviewed  Physical Exam .   GEN: No acute distress.   Neck: No JVD Cardiac: irregular, no murmurs, rubs, or gallops.  Respiratory: Clear to auscultation bilaterally. GI: Soft, nontender, non-distended  MS: No edema  Assessment & Plan .     CHF: Asymptomatic,  appears clinically euvolemic.  His weight is identical with his estimated dry weight in the office.  His OptiVol was out of range for most of the month of January, but had just returned to baseline on the ICD download we did yesterday. Afib: He had very low burden of atrial fibrillation over the last year while taking dofetilide, but he had atrial flutter that persisted for the previous 2-3 days after we had stopped his dofetilide for excessive QT prolongation.  We were able to successfully overdrive his atrial flutter today, but I think he is back in the same rhythm abnormality or atrial fibrillation today.  I think it would be futile to try overdrive pacing again until he is reloaded on dofetilide.  The patient and his wife understand that recent initiation of dofetilide requires 72-hour monitoring in the hospital.  His potassium and magnesium are optimal range today, so we will start the process. BiV ICD: Normal device function on comprehensive interrogation yesterday QTC prolongation: On the tracing performed while he was in relatively normal rhythm yesterday afternoon the QTc was 528 ms, with a QRS duration of 190 ms (so accounting for the IVCD his "QRS- corrected QTc" would be roughly 435 ms).  Note that his most recent available  ECG from [facility] reported a QTc of 600 ms (that would be roughly 505 ms when adjusting for the prolonged QRS). For questions or updates, please contact [facility] Please consult [email or web address] for contact info under        Signed, Thurmon Fair, MD

## 2023-03-28 NOTE — Plan of Care (Signed)
  Problem: Fluid Volume: Goal: Ability to maintain a balanced intake and output will improve Outcome: Progressing   Problem: Nutritional: Goal: Maintenance of adequate nutrition will improve Outcome: Progressing   Problem: Coping: Goal: Ability to adjust to condition or change in health will improve Outcome: Not Progressing   Problem: Health Behavior/Discharge Planning: Goal: Ability to manage health-related needs will improve Outcome: Not Progressing   Problem: Metabolic: Goal: Ability to maintain appropriate glucose levels will improve Outcome: Not Progressing   Problem: Skin Integrity: Goal: Risk for impaired skin integrity will decrease Outcome: Not Progressing

## 2023-03-28 NOTE — Progress Notes (Signed)
[facility]                                                                               [name], is a 78 y.o. male, DOB - [birth year], ZOX:[id number] Admit date - 03/21/2023    Outpatient Primary MD for the patient is Corwin Levins, MD  LOS - 6  days    Brief summary    [name] is a 78 y.o. male with medical history significant for insulin-dependent type 2 diabetes, diabetic peripheral neuropathy, hyperlipidemia, persistent atrial fibrillation on anticoagulation, ischemic cardiomyopathy EF 20% currently being treated for right lower extremity diabetic wound and is being admitted to the hospital with rhabdomyolysis after a fall at home today.   He has been followed by Dr. Lady Gary for a right lower extremity diabetic wound, recent outpatient cultures grew Proteus and he has been placed on oral Levaquin.  Workup in the emergency department is actually quite benign, though he is significantly hypotensive, and has elevated CK.  He was admitted for hypotension and rhabdomyolysis. Hospital course significant for hyperkalemia, AKI and acute respiratory failure with hypoxia secondary to meta pneumovirus infection and acute CHF.     Assessment & Plan    Assessment and Plan:  Acute respiratory failure with hypoxia secondary to a combination acute on chronic systolic failure and meta pneumovirus infection.   Pt had a respiratory event on 1/23 with diffuse wheezing , dyspnea, requiring up to 6 lit/min of [place] oxygen.  Respiratory panel came back positive for meta pneumovirus infection.  COVID is negative.  CXR showing improving pulmonary edema.  ABG shows hypoxia.  He was started on IV solumedrol 60 mg daily, transition to prednisone and completed the course He was weaned off oxygen. Continue with duonebs as needed.  Continue with torsemide, Jardiance.    Rhabdomyolysis:   Much improved.   In view of h/o severe ischemic cardiomyopathy with EF of 20%, we stopped the  IV fluids.  Ck levels wnl.    Orthostatic hypotension:  Elevated lactic acid/ lactic acidosis resolved.    Generalized Weakness and falls suspect this may be due to overdiuresis due to some medication errors as described by his daughter (sometimes accidentally takes double doses of medications) and resulting dehydration with hypotension and meta pneumovirus infection.  Therapy evaluations ordered and recommending SNF.   AKI: From fluid overload and hypoperfusion to kidneys.  Creatinine back to baseline.    Hyponatremia:  Suspect from fluid overload.  Sodium of 132.    Elevated liver enzymes:  Unclear etiology. Pt denies any nausea, vomiting or abdominal pain.  ? Congestion from CHF.   Monitor.    Diabetic foot wound on the right. X-ray today without evidence of osteomyelitis or other complication.  Follows up with Dr Lady Gary as outpatient.  Completed 10 day course of antibiotics.  Wound care on board.   Insulin Dependent Type 2 DM uncontrolled with hyperglycemia from steroids.  CBG (last 3)  Recent Labs    03/27/23 2008 03/28/23 0736 03/28/23 1148  GLUCAP 258* 157* 170*   Resume SSI. Increase to 4 units TIDAC if he consumes >50% of his meals.  A1c is 7.2%.  Hyperlipidemia Holding crestor for rhabdomyolysis.  Restart on discharge.    Atrial fibrillation On metoprolol and xarelto .  Tikosyn initially held for hyperkalemia, rhabdomyolysis AND qtc >500, restarting tikosyn today.   Cardiology on board and appreciate recommendations.  Keep potassium greater than 4 and magnesium >2.    Hyperkalemia Resolved.    Anxiety  Resume ativan.      Estimated body mass index is 24.33 kg/m as calculated from the following:   Height as of this encounter: 5\' 10"  (1.778 m).   Weight as of this encounter: 76.9 kg.  Code Status: full code. DVT Prophylaxis:  SCDs Start: 03/21/23 1728 rivaroxaban (XARELTO) tablet 20 mg   Level of Care: Level of care: ICU Family  Communication: family at bedside.   Disposition Plan:     Remains inpatient appropriate:  pending clinical improvement.   Procedures:  None.   Consultants:   Cardiology.  Antimicrobials:   Anti-infectives (From admission, onward)    Start     Dose/Rate Route Frequency Ordered Stop   03/22/23 0500  ceFEPIme (MAXIPIME) 2 g in sodium chloride 0.9 % 100 mL IVPB        2 g 200 mL/hr over 30 Minutes Intravenous Every 12 hours 03/21/23 1811 03/24/23 1740   03/21/23 1345  ceFEPIme (MAXIPIME) 2 g in sodium chloride 0.9 % 100 mL IVPB        2 g 200 mL/hr over 30 Minutes Intravenous  Once 03/21/23 1338 03/21/23 1500        Medications  Scheduled Meds:  aspirin EC  81 mg Oral Daily   dofetilide  500 mcg Oral BID   empagliflozin  25 mg Oral Daily   fluticasone  2 spray Each Nare Daily   insulin aspart  0-15 Units Subcutaneous TID WC   insulin aspart  0-5 Units Subcutaneous QHS   insulin aspart  4 Units Subcutaneous TID WC   insulin glargine-yfgn  14 Units Subcutaneous Daily   loratadine  10 mg Oral Daily   LORazepam  0.5 mg Oral QHS   magnesium oxide  400 mg Oral BID   metoprolol  200 mg Oral QPC supper   polyethylene glycol  17 g Oral Daily   rivaroxaban  20 mg Oral QPC supper   senna-docusate  2 tablet Oral BID   spironolactone  25 mg Oral Daily   torsemide  20 mg Oral Daily   Continuous Infusions:   PRN Meds:.acetaminophen **OR** acetaminophen, benzonatate, guaiFENesin-dextromethorphan, ipratropium-albuterol, LORazepam, phenol, zolpidem    Subjective:   [name] was seen and examined today. Sleeping comfortably.   Objective:   Vitals:   03/27/23 1759 03/27/23 1934 03/28/23 1344 03/28/23 1419  BP: 117/87 118/76 107/74 117/81  Pulse: 76 74 83   Resp:  16 18   Temp:  98.2 F (36.8 C) 97.9 F (36.6 C) (!) 96.4 F (35.8 C)  TempSrc:  Oral Oral Axillary  SpO2:  95% 96%   Weight:    76.9 kg  Height:    5\' 10"  (1.778 m)    Intake/Output Summary (Last 24  hours) at 03/28/2023 1435 Last data filed at 03/28/2023 1114 Gross per 24 hour  Intake 240 ml  Output 1725 ml  Net -1485 ml   Filed Weights   03/25/23 0604 03/26/23 0544 03/28/23 1419  Weight: 84.7 kg 84.5 kg 76.9 kg     Exam  General exam: Appears calm and comfortable  Respiratory system: Clear to auscultation. Respiratory effort normal. Cardiovascular system: S1 &  S2 heard, RRR. No JVD,  Gastrointestinal system: Abdomen is nondistended, soft and nontender.  Central nervous system: oriented, grossly non focal.  Extremities: Symmetric 5 x 5 power. Skin: No rashes,         Data Reviewed:  I have personally reviewed following labs and imaging studies   CBC Lab Results  Component Value Date   WBC 11.1 (H) 03/27/2023   RBC 4.90 03/27/2023   HGB 14.2 03/27/2023   HCT 43.7 03/27/2023   MCV 89.2 03/27/2023   MCH 29.0 03/27/2023   PLT 171 03/27/2023   MCHC 32.5 03/27/2023   RDW 14.0 03/27/2023   LYMPHSABS 1.5 03/27/2023   MONOABS 1.3 (H) 03/27/2023   EOSABS 0.0 03/27/2023   BASOSABS 0.0 03/27/2023     Last metabolic panel Lab Results  Component Value Date   NA 132 (L) 03/28/2023   K 4.4 03/28/2023   CL 90 (L) 03/28/2023   CO2 29 03/28/2023   BUN 60 (H) 03/28/2023   CREATININE 1.16 03/28/2023   GLUCOSE 176 (H) 03/28/2023   GFRNONAA >60 03/28/2023   GFRAA >60 03/18/2017   CALCIUM 9.4 03/28/2023   PROT 7.1 03/27/2023   ALBUMIN 3.2 (L) 03/27/2023   BILITOT 1.2 03/27/2023   ALKPHOS 108 03/27/2023   AST 52 (H) 03/27/2023   ALT 105 (H) 03/27/2023   ANIONGAP 13 03/28/2023    CBG (last 3)  Recent Labs    03/27/23 2008 03/28/23 0736 03/28/23 1148  GLUCAP 258* 157* 170*      Coagulation Profile: No results for input(s): "INR", "PROTIME" in the last 168 hours.    Radiology Studies: No results found.      Kathlen Mody M.D. [facility] 03/28/2023, 2:35 PM  Available via Epic secure chat 7am-7pm After 7 pm, please refer to night coverage  provider listed on amion.

## 2023-03-28 NOTE — Plan of Care (Signed)
  Problem: Coping: Goal: Ability to adjust to condition or change in health will improve Outcome: Progressing   Problem: Fluid Volume: Goal: Ability to maintain a balanced intake and output will improve Outcome: Progressing   Problem: Health Behavior/Discharge Planning: Goal: Ability to manage health-related needs will improve Outcome: Progressing   Problem: Clinical Measurements: Goal: Cardiovascular complication will be avoided Outcome: Progressing   Problem: Coping: Goal: Level of anxiety will decrease Outcome: Progressing   Problem: Metabolic: Goal: Ability to maintain appropriate glucose levels will improve Outcome: Not Progressing   Problem: Skin Integrity: Goal: Risk for impaired skin integrity will decrease Outcome: Not Progressing

## 2023-03-28 NOTE — Progress Notes (Signed)
Pharmacy: Dofetilide (Tikosyn) - Initial Consult Assessment and Electrolyte Replacement  Pharmacy consulted to assist in monitoring and replacing electrolytes in this 78 y.o. male admitted on 03/21/2023 undergoing dofetilide re-initiation. First dofetilide dose: 1/28  Assessment:  Patient Exclusion Criteria: If any screening criteria checked as "Yes", then  patient  should NOT receive dofetilide until criteria item is corrected.  If "Yes" please indicate correction plan.  YES  NO Patient  Exclusion Criteria Correction Plan   [x]   []   Baseline QTc interval is greater than or equal to 440 msec. IF above YES box checked dofetilide contraindicated unless patient has ICD; then may proceed if QTc 500-550 msec or with known ventricular conduction abnormalities may proceed with QTc 550-600 msec. QTc = 0.45  NOTE - subtract 1/2 QRS from QTc to correct for pacemaker - 1/28 EKG: QTcB 589, QRS 188 - Corrected for pacemaker/QRS, the QTc is 495    []   [x]   Patient is known or suspected to have a digoxin level greater than 2 ng/ml: No results found for: "DIGOXIN"     []   [x]   Creatinine clearance less than 20 ml/min (calculated using Cockcroft-Gault, actual body weight and serum creatinine): Estimated Creatinine Clearance: 56.8 mL/min (by C-G formula based on SCr of 1.16 mg/dL).  1/28:  CrCl ~ 65 ml/min using TBW 84.5 kg and SCr 1.16   []   [x]  Patient has received drugs known to prolong the QT intervals within the last 48 hours (phenothiazines, tricyclics or tetracyclic antidepressants, erythromycin, H-1 antihistamines, cisapride, fluoroquinolones, azithromycin, ondansetron).   Updated information on QT prolonging agents is available to be searched on the following database:QT prolonging agents  -Contributing meds prior to admission: fluoxetine (not restarted, last taken 1/21 outpatient), citalopram (not restarted, last taken 1/21 outpatient), LVQ PTA (d/c), ondansetron (last dose on 1/23)    []   [x]  Patient received a dose of a thiazide diuretic in the last 48 hours [including hydrochlorothiazide (Oretic) alone or in any combination including triamterene (Dyazide, Maxzide)].    []   [x]  Patient received a medication known to increase dofetilide plasma concentrations prior to initial dofetilide dose:  Trimethoprim (Primsol, Proloprim) in the last 36 hours Verapamil (Calan, Verelan) in the last 36 hours or a sustained release dose in the last 72 hours Megestrol (Megace) in the last 5 days  Cimetidine (Tagamet) in the last 6 hours Ketoconazole (Nizoral) in the last 24 hours Itraconazole (Sporanox) in the last 48 hours  Prochlorperazine (Compazine) in the last 36 hours     []   [x]   Patient is known to have a history of torsades de pointes; congenital or acquired long QT syndromes.    []   [x]   Patient has received a Class 1 antiarrhythmic with less than 2 half-lives since last dose. (Disopyramide, Quinidine, Procainamide, Lidocaine, Mexiletine, Flecainide, Propafenone)    []   [x]   Patient has received amiodarone therapy in the past 3 months or amiodarone level is greater than 0.3 ng/ml.    Labs:    Component Value Date/Time   K 4.4 03/28/2023 0415   MG 2.4 03/28/2023 0415     Plan: Select One Calculated CrCl  Dose q12h  [x]  > 60 ml/min 500 mcg  []  40-60 ml/min 250 mcg  []  20-40 ml/min 125 mcg  1/28:  CrCl ~ 65 ml/min using TBW 84.5 kg and SCr 1.16   [x]   Physician selected initial dose within range recommended for patients level of renal function - will monitor for response.  []   Physician selected  initial dose outside of range recommended for patients level of renal function - will discuss if the dose should be altered at this time.   Patient has been appropriately anticoagulated with rivaroxaban 20mg  q24h.  Potassium: K >/= 4: Appropriate to initiate Tikosyn, no replacement needed    Magnesium: Mg >2: Appropriate to initiate Tikosyn, no replacement  needed     Thank you for allowing pharmacy to participate in this patient's care   Lynann Beaver PharmD, BCPS [facility] main pharmacy [phone] 03/28/2023 2:05 PM

## 2023-03-28 NOTE — Patient Instructions (Signed)
1) Strengthening: Chest Pull - Resisted   Hold Theraband in front of body with hands about shoulder width a part. Pull band a part and back together slowly. Repeat __10-15__ times. Complete ___1_ set(s) per session.. Repeat ___1_ session(s) per day.  http://orth.exer.us/926   Copyright  VHI. All rights reserved.   2) PNF Strengthening: Resisted   Standing with resistive band around each hand, bring right arm up and away, thumb back. Repeat _10-15___ times per set. Do __1__ sets per session. Do __1__ sessions per day.    3) Resisted External Rotation: in Neutral - Bilateral   Sit or stand, tubing in both hands, elbows at sides, bent to 90, forearms forward. Pinch shoulder blades together and rotate forearms out. Keep elbows at sides. Repeat _10-15___ times per set. Do __1__ sets per session. Do _1___ sessions per day.  http://orth.exer.us/966   Copyright  VHI. All rights reserved.   4) PNF Strengthening: Resisted   Standing, hold resistive band above head. Bring right arm down and out from side. Repeat _10-15___ times per set. Do __1__ sets per session. Do __1__ sessions per day.  http://orth.exer.us/922   Copyright  VHI. All rights reserved.     ELASTIC BAND TRICEPS EXTENSION - SELF FIXATION  While seated, hold and fixate one end of an elastic band against your chest. Hold the other end with your opposite hand with your elbow bent and arm by your side.   Start by pulling the band downward so that the elbow goes from a bent position to a straightened position as shown. Return to starting position and repeat.  Complete 10-15 repetitions, 1 set.     Theraband Elbow Flexion  Loop the middle of the band around one foot  With arms straight by side and palms facing forward, hold onto each end of the band Bend arms bringing hands toward shoulders, keeping elbows by your side Return to starting position . Complete 10-15 repetitions, 1 set.

## 2023-03-28 NOTE — TOC Progression Note (Addendum)
Transition of Care [facility] - Progression Note   Patient Details  Name: [name] MRN: [id number] Date of Birth: [birth year]  [facility] CM/SW Contact  Ewing Schlein, LCSW Phone Number: 03/28/2023, 12:46 PM  Clinical Narrative: CSW confirmed with Tresa Endo in admissions at [facility] that the bed will be available tomorrow. CSW completed insurance authorization on [employer] portal. Reference ID# is: [id number]. Patient has been approved for 03/29/2023-03/31/2023. CSW notified Tresa Endo in admissions of insurance approval.  Addendum: Patient's Tikosyn will need to be restarted and now the patient will need to be monitored for 6 doses before he can discharge to SNF. CSW updated Tresa Endo.  Expected Discharge Plan: Skilled Nursing Facility Barriers to Discharge: SNF Pending bed offer, Continued Medical Work up  Expected Discharge Plan and Services In-house Referral: Clinical Social Work Discharge Planning Services: Other - See comment (SNF) Living arrangements for the past 2 months: Single Family Home DME Agency: NA  Social Determinants of Health (SDOH) Interventions SDOH Screenings   Food Insecurity: No Food Insecurity (03/21/2023)  Housing: Low Risk  (03/21/2023)  Transportation Needs: No Transportation Needs (03/21/2023)  Utilities: Not At Risk (03/21/2023)  Alcohol Screen: Low Risk  (03/18/2022)  Depression (PHQ2-9): Low Risk  (08/24/2022)  Financial Resource Strain: Low Risk  (03/18/2022)  Physical Activity: Inactive (03/18/2022)  Social Connections: Socially Isolated (03/21/2023)  Stress: No Stress Concern Present (03/18/2022)  Tobacco Use: Low Risk  (03/21/2023)   Readmission Risk Interventions    03/23/2023    1:14 PM  Readmission Risk Prevention Plan  Transportation Screening Complete  PCP or Specialist Appt within 5-7 Days Complete  Home Care Screening Complete  Medication Review (RN CM) Complete

## 2023-03-29 DIAGNOSIS — Z95810 Presence of automatic (implantable) cardiac defibrillator: Secondary | ICD-10-CM

## 2023-03-29 DIAGNOSIS — I480 Paroxysmal atrial fibrillation: Secondary | ICD-10-CM | POA: Diagnosis not present

## 2023-03-29 DIAGNOSIS — I5042 Chronic combined systolic (congestive) and diastolic (congestive) heart failure: Secondary | ICD-10-CM | POA: Diagnosis not present

## 2023-03-29 LAB — CBC WITH DIFFERENTIAL/PLATELET
Abs Immature Granulocytes: 0.09 10*3/uL — ABNORMAL HIGH (ref 0.00–0.07)
Basophils Absolute: 0 10*3/uL (ref 0.0–0.1)
Basophils Relative: 0 %
Eosinophils Absolute: 0 10*3/uL (ref 0.0–0.5)
Eosinophils Relative: 0 %
HCT: 47.9 % (ref 39.0–52.0)
Hemoglobin: 15.8 g/dL (ref 13.0–17.0)
Immature Granulocytes: 1 %
Lymphocytes Relative: 16 %
Lymphs Abs: 1.7 10*3/uL (ref 0.7–4.0)
MCH: 29.3 pg (ref 26.0–34.0)
MCHC: 33 g/dL (ref 30.0–36.0)
MCV: 88.9 fL (ref 80.0–100.0)
Monocytes Absolute: 1.4 10*3/uL — ABNORMAL HIGH (ref 0.1–1.0)
Monocytes Relative: 13 %
Neutro Abs: 7.5 10*3/uL (ref 1.7–7.7)
Neutrophils Relative %: 70 %
Platelets: 191 10*3/uL (ref 150–400)
RBC: 5.39 MIL/uL (ref 4.22–5.81)
RDW: 13.8 % (ref 11.5–15.5)
WBC: 10.7 10*3/uL — ABNORMAL HIGH (ref 4.0–10.5)
nRBC: 0 % (ref 0.0–0.2)

## 2023-03-29 LAB — BASIC METABOLIC PANEL
Anion gap: 14 (ref 5–15)
Anion gap: 11 (ref 5–15)
BUN: 57 mg/dL — ABNORMAL HIGH (ref 8–23)
BUN: 53 mg/dL — ABNORMAL HIGH (ref 8–23)
CO2: 29 mmol/L (ref 22–32)
CO2: 33 mmol/L — ABNORMAL HIGH (ref 22–32)
Calcium: 9.7 mg/dL (ref 8.9–10.3)
Calcium: 9.5 mg/dL (ref 8.9–10.3)
Chloride: 93 mmol/L — ABNORMAL LOW (ref 98–111)
Chloride: 89 mmol/L — ABNORMAL LOW (ref 98–111)
Creatinine, Ser: 1.2 mg/dL (ref 0.61–1.24)
Creatinine, Ser: 1.02 mg/dL (ref 0.61–1.24)
GFR, Estimated: >60 mL/min (ref >60)
GFR, Estimated: >60 mL/min (ref >60)
Glucose, Bld: 147 mg/dL — ABNORMAL HIGH (ref 70–99)
Glucose, Bld: 180 mg/dL — ABNORMAL HIGH (ref 70–99)
Potassium: 3.8 mmol/L (ref 3.5–5.1)
Potassium: 3.5 mmol/L (ref 3.5–5.1)
Sodium: 136 mmol/L (ref 135–145)
Sodium: 133 mmol/L — ABNORMAL LOW (ref 135–145)

## 2023-03-29 LAB — POTASSIUM: Potassium: 3.5 mmol/L (ref 3.5–5.1)

## 2023-03-29 LAB — HEPATIC FUNCTION PANEL
ALT: 71 U/L — ABNORMAL HIGH (ref 0–44)
AST: 31 U/L (ref 15–41)
Albumin: 3.3 g/dL — ABNORMAL LOW (ref 3.5–5.0)
Alkaline Phosphatase: 107 U/L (ref 38–126)
Bilirubin, Direct: 0.3 mg/dL — ABNORMAL HIGH (ref 0.0–0.2)
Indirect Bilirubin: 0.9 mg/dL (ref 0.3–0.9)
Total Bilirubin: 1.2 mg/dL (ref 0.0–1.2)
Total Protein: 7.4 g/dL (ref 6.5–8.1)

## 2023-03-29 LAB — GLUCOSE, CAPILLARY
Glucose-Capillary: 110 mg/dL — ABNORMAL HIGH (ref 70–99)
Glucose-Capillary: 221 mg/dL — ABNORMAL HIGH (ref 70–99)
Glucose-Capillary: 202 mg/dL — ABNORMAL HIGH (ref 70–99)
Glucose-Capillary: 173 mg/dL — ABNORMAL HIGH (ref 70–99)

## 2023-03-29 LAB — MAGNESIUM: Magnesium: 2.5 mg/dL — ABNORMAL HIGH (ref 1.7–2.4)

## 2023-03-29 MED ORDER — POTASSIUM CHLORIDE CRYS ER 20 MEQ PO TBCR
40.0000 meq | EXTENDED_RELEASE_TABLET | ORAL | Status: AC
  Administered 2023-03-29 (×2): 40 meq via ORAL
  Filled 2023-03-29 (×2): qty 2

## 2023-03-29 MED ORDER — DOFETILIDE 250 MCG PO CAPS
250.0000 ug | ORAL_CAPSULE | Freq: Two times a day (BID) | ORAL | Status: DC
Start: 2023-03-29 — End: 2023-03-29
  Filled 2023-03-29: qty 1

## 2023-03-29 MED ORDER — DOFETILIDE 250 MCG PO CAPS
250.0000 ug | ORAL_CAPSULE | Freq: Two times a day (BID) | ORAL | Status: DC
  Administered 2023-03-29 – 2023-04-01 (×7): 250 ug via ORAL
  Filled 2023-03-29 (×7): qty 1

## 2023-03-29 MED ORDER — POTASSIUM CHLORIDE CRYS ER 20 MEQ PO TBCR
40.0000 meq | EXTENDED_RELEASE_TABLET | ORAL | Status: AC
  Administered 2023-03-29: 40 meq via ORAL
  Filled 2023-03-29: qty 2

## 2023-03-29 MED ORDER — POTASSIUM CHLORIDE CRYS ER 20 MEQ PO TBCR
40.0000 meq | EXTENDED_RELEASE_TABLET | ORAL | Status: DC
Start: 2023-03-29 — End: 2023-03-29

## 2023-03-29 NOTE — Progress Notes (Signed)
PROGRESS NOTE  [name] ZOX:[id number] DOB: October 19, 1945 DOA: 03/21/2023 PCP: Corwin Levins, MD   LOS: 7 days   Brief Narrative / Interim history: 78 y.o. male with medical history significant for insulin-dependent type 2 diabetes, diabetic peripheral neuropathy, hyperlipidemia, persistent atrial fibrillation on anticoagulation, ischemic cardiomyopathy EF 20% currently being treated for right lower extremity diabetic wound and is being admitted to the hospital with rhabdomyolysis after a fall at home.  He has been followed by Dr. Lady Gary for right lower extremity diabetic wound, recent outpatient cultures grew Proteus and has finished Levaquin course.  He was found to be hypotensive in the ED, had elevated CK and was admitted to the hospital.  Hospital course complicated by hyperkalemia, AKI, hypoxic respiratory failure due to metapneumovirus infection as well as acute on chronic CHF along with persistent a flutter in the setting of dofetilide discontinuation due to excessive QT prolongation.  Subjective / 24h Interval events: Continues to complain of significant weakness.  Denies any chest pain, denies any shortness of breath.  Assesement and Plan: Principal Problem:   Rhabdomyolysis Active Problems:   PAF (paroxysmal atrial fibrillation) (HCC)   Orthostatic hypotension   Acute on chronic combined systolic and diastolic heart failure (HCC)   Persistent atrial fibrillation (HCC)   Atypical atrial flutter (HCC)   Principal problem Acute hypoxic respiratory failure-multifactorial due to acute on chronic systolic CHF as well as metapneumovirus infection.  Had respiratory decompensation 1/23 with diffuse wheezing, dyspnea requiring 6 L -Currently on 2 L, wean off to room air as tolerated. -Completed a course of steroids and a course of antibiotics  Active problems PAF/flutter -due to prolonged QTc, dofetilide was discontinued, and he has recently been in persistent a flutter with reduction  in deficiency of biventricular pacing.  Cardiology consulted and following, he underwent successful overdrive pacing of the A-flutter but with very frequent ectopy there is a risk of recurrence and he is now being reloaded with Tikosyn.  Management per cards, monitor in stepdown while reloading -Also continue metoprolol, anticoagulation with Xarelto  CK, non-traumatic-in the setting of fall, unknown amount of time on the floor, CK now normalized   Orthostatic hypotension - Elevated lactic acid/ lactic acidosis resolved.    Acute on chronic combined CHF-most recent 2D echo March 2024 shows LVEF 20-25%.  Clinically appears euvolemic.  Continue empagliflozin, aspirin, metoprolol, spironolactone, torsemide   Generalized weakness, falls -possibly related to some medication errors, per prior hospitalist may take accidentally double doses.  SNF recommended, currently not stable to go  AKI - From fluid overload and hypoperfusion to kidneys. Creatinine back to baseline.    Hyponatremia - Suspect from fluid overload.  Sodium now normalized   Elevated liver enzymes -minimal, unlikely to be significant clinically  Diabetic foot wound on the right - X-ray without evidence of osteomyelitis or other complication. Follows up with Dr Lady Gary as outpatient.  Completed 10 day course of antibiotics.   DM2, with hyperglycemia -A1c 7.2 which is acceptable in his age group.  Continue sliding scale  Hyperlipidemia -resume Crestor on discharge   Hyperkalemia - Resolved   Anxiety - continue ativan.   Scheduled Meds:  aspirin EC  81 mg Oral Daily   Chlorhexidine Gluconate Cloth  6 each Topical Daily   dofetilide  250 mcg Oral BID   empagliflozin  25 mg Oral Daily   fluticasone  2 spray Each Nare Daily   insulin aspart  0-15 Units Subcutaneous TID WC   insulin aspart  0-5  Units Subcutaneous QHS   insulin aspart  4 Units Subcutaneous TID WC   insulin glargine-yfgn  14 Units Subcutaneous Daily   LORazepam  0.5  mg Oral QHS   magnesium oxide  400 mg Oral BID   metoprolol  200 mg Oral QPC supper   polyethylene glycol  17 g Oral Daily   rivaroxaban  20 mg Oral QPC supper   senna-docusate  2 tablet Oral BID   spironolactone  25 mg Oral Daily   torsemide  20 mg Oral Daily   Continuous Infusions: PRN Meds:.acetaminophen **OR** acetaminophen, benzonatate, guaiFENesin-dextromethorphan, ipratropium-albuterol, LORazepam, mouth rinse, phenol, zolpidem  Current Outpatient Medications  Medication Instructions   aspirin EC 81 mg, Oral, Daily, Swallow whole.   Cholecalciferol (VITAMIN D3 PO) 1 capsule, Daily   citalopram (CELEXA) 40 mg, Oral, Every morning   Continuous Glucose Receiver (FREESTYLE LIBRE 2 READER) DEVI Use as instructure to check blood sugar   Continuous Glucose Sensor (FREESTYLE LIBRE 2 SENSOR) MISC Apply new sensor every 14 days   Cyanocobalamin (VITAMIN B12 PO) 1 tablet, Daily   dofetilide (TIKOSYN) 500 MCG capsule TAKE 1 CAPSULE BY MOUTH 2 TIMES DAILY   empagliflozin (JARDIANCE) 25 mg, Oral, Daily   eplerenone (INSPRA) 50 mg, Oral, Every morning   FLUoxetine (PROZAC) 10 mg, Oral, Daily   fluticasone (FLONASE) 50 MCG/ACT nasal spray USE 2 SPRAYS IN EACH NOSTRIL EVERY DAY   Glucose Blood (FREESTYLE PRECISION NEO TEST VI) In Vitro   glucose blood (ONE TOUCH ULTRA TEST) test strip 1 each, Other, 2 times daily   insulin lispro (HUMALOG KWIKPEN) 100 UNIT/ML KwikPen INJECT 10-14 units under skin before meals, 3 times a day, as advised   Lancets MISC Use as directed up to 4 times per day   LANTUS SOLOSTAR 100 UNIT/ML Solostar Pen INJECT 14 UNITS UNDER THE SKIN DAILY   liraglutide (VICTOZA) 1.8 mg, Subcutaneous, Every morning   LORazepam (ATIVAN) 1 MG tablet TAKE 1/2 TABLET BY MOUTH EVERY DAY AS NEEDED FOR ANXIETY   LORazepam (ATIVAN) 0.5 mg, Oral, Daily at bedtime   Magnesium Oxide (MAG-OX 400 PO) 400 mg, Oral, 2 times daily   metoprolol (TOPROL-XL) 200 mg, Oral, Daily after supper   Multiple  Vitamins-Minerals (CENTRUM SILVER 50+MEN) TABS 1 tablet, Daily with breakfast   Nitrostat 0.4 mg, Sublingual, Every 5 min PRN   Pyridoxine HCl (VITAMIN B6 PO) 1 tablet, Daily   rosuvastatin (CRESTOR) 20 MG tablet TAKE 1 TABLET BY MOUTH EVERY DAY   sacubitril-valsartan (ENTRESTO) 24-26 MG 1 tablet, Oral, 2 times daily   torsemide (DEMADEX) 10 mg, Oral, Every other day   ULTICARE MICRO PEN NEEDLES 32G X 4 MM MISC USE DAILY AS DIRECTED   XARELTO 20 MG TABS tablet TAKE 1 TABLET BY MOUTH EVERY DAY   zolpidem (AMBIEN) 5 MG tablet TAKE 1 OR 2 TABLETS BY MOUTH NIGHTLY AT BEDTIME AS NEEDED    Diet Orders (From admission, onward)     Start     Ordered   03/21/23 1730  Diet heart healthy/carb modified Room service appropriate? Yes; Fluid consistency: Thin  Diet effective now       Question Answer Comment  Diet-HS Snack? Nothing   Room service appropriate? Yes   Fluid consistency: Thin      03/21/23 1730            DVT prophylaxis: SCDs Start: 03/21/23 1728 rivaroxaban (XARELTO) tablet 20 mg   Lab Results  Component Value Date   PLT 191 03/29/2023  Code Status: Full Code  Family Communication: No family at bedside  Status is: Inpatient Remains inpatient appropriate because: severity of illness  Level of care: Stepdown  Consultants:  Cardiology   Objective: Vitals:   03/29/23 0800 03/29/23 0900 03/29/23 1000 03/29/23 1100  BP: 126/82 99/66 103/61 103/64  Pulse: 78 74 75 73  Resp: (!) 21 18 17 17   Temp:      TempSrc:      SpO2: 91% 90% 94% 94%  Weight:      Height:        Intake/Output Summary (Last 24 hours) at 03/29/2023 1133 Last data filed at 03/29/2023 0904 Gross per 24 hour  Intake 120 ml  Output 900 ml  Net -780 ml   Wt Readings from Last 3 Encounters:  03/29/23 76.9 kg  11/21/22 87.2 kg  08/24/22 86.6 kg    Examination:  Constitutional: NAD Eyes: no scleral icterus ENMT: Mucous membranes are moist.  Neck: normal, supple Respiratory: clear to  auscultation bilaterally, no wheezing, no crackles.  Cardiovascular: Regular rate and rhythm, no murmurs / rubs / gallops Abdomen: non distended, no tenderness. Bowel sounds positive.  Musculoskeletal: no clubbing / cyanosis.   Data Reviewed: I have independently reviewed following labs and imaging studies   CBC Recent Labs  Lab 03/23/23 0510 03/23/23 1917 03/24/23 0347 03/27/23 0338 03/29/23 0305  WBC 10.0 11.5* 7.4 11.1* 10.7*  HGB 15.0 13.7 13.6 14.2 15.8  HCT 46.3 41.6 41.2 43.7 47.9  PLT 137* 136* 119* 171 191  MCV 91.7 90.0 89.8 89.2 88.9  MCH 29.7 29.7 29.6 29.0 29.3  MCHC 32.4 32.9 33.0 32.5 33.0  RDW 14.4 14.4 14.1 14.0 13.8  LYMPHSABS 0.6*  --  0.5* 1.5 1.7  MONOABS 1.2*  --  0.4 1.3* 1.4*  EOSABS 0.0  --  0.0 0.0 0.0  BASOSABS 0.0  --  0.0 0.0 0.0    Recent Labs  Lab 03/23/23 1121 03/23/23 1500 03/23/23 1917 03/24/23 0347 03/25/23 0431 03/26/23 0413 03/26/23 0414 03/27/23 0338 03/27/23 1644 03/28/23 0415 03/29/23 0305  NA 135  --  128* 133* 136  --  140 134* 135 132* 136  K 3.7  --  3.5 4.0 3.9  --  3.4* 3.8 3.6 4.4 3.8  CL 97*  --  92* 96* 99  --  101 94* 92* 90* 93*  CO2 24  --  21* 25 25  --  27 28 30 29 29   GLUCOSE 120*  --  153* 161* 151*  --  166* 144* 191* 176* 147*  BUN 28*  --  35* 32* 44*  --  48* 54* 56* 60* 57*  CREATININE 1.34*  --  1.48* 1.21 1.04  --  0.85 1.15 1.07 1.16 1.20  CALCIUM 8.2*  --  7.9* 8.3* 8.8*  --  8.8* 9.3 9.5 9.4 9.7  AST  --   --  188*  --   --  86*  --  52*  --   --  31  ALT  --   --  130*  --   --  128*  --  105*  --   --  71*  ALKPHOS  --   --  91  --   --  109  --  108  --   --  107  BILITOT  --   --  1.3*  --   --  0.9  --  1.2  --   --  1.2  ALBUMIN  --   --  3.3*  --   --  3.2*  --  3.2*  --   --  3.3*  MG  --   --   --   --  2.5*  --   --   --   --  2.4 2.5*  LATICACIDVEN 1.9 1.8  --   --   --   --   --   --   --   --   --   BNP  --   --   --  771.7*  --   --   --   --   --   --   --      ------------------------------------------------------------------------------------------------------------------ No results for input(s): "CHOL", "HDL", "LDLCALC", "TRIG", "CHOLHDL", "LDLDIRECT" in the last 72 hours.  Lab Results  Component Value Date   HGBA1C 7.2 (A) 11/21/2022   ------------------------------------------------------------------------------------------------------------------ No results for input(s): "TSH", "T4TOTAL", "T3FREE", "THYROIDAB" in the last 72 hours.  Invalid input(s): "FREET3"  Cardiac Enzymes No results for input(s): "CKMB", "TROPONINI", "MYOGLOBIN" in the last 168 hours.  Invalid input(s): "CK" ------------------------------------------------------------------------------------------------------------------    Component Value Date/Time   BNP 771.7 (H) 03/24/2023 0347    CBG: Recent Labs  Lab 03/28/23 0736 03/28/23 1148 03/28/23 1648 03/28/23 2125 03/29/23 0751  GLUCAP 157* 170* 285* 141* 110*    Recent Results (from the past 240 hours)  Blood Culture (routine x 2)     Status: None   Collection Time: 03/21/23  2:05 PM   Specimen: BLOOD RIGHT FOREARM  Result Value Ref Range Status   Specimen Description   Final    BLOOD RIGHT FOREARM Performed at [facility], [address] [place], [place] [address]    Special Requests   Final    BOTTLES DRAWN AEROBIC AND ANAEROBIC Blood Culture adequate volume Performed at [facility] [address] [place], [place] [address]    Culture   Final    NO GROWTH 5 DAYS Performed at [facility], [address], [place], [place] [address]    Report Status 03/26/2023 FINAL  Final  Blood Culture (routine x 2)     Status: None   Collection Time: 03/21/23  2:15 PM   Specimen: BLOOD LEFT FOREARM  Result Value Ref Range Status   Specimen Description   Final    BLOOD LEFT FOREARM Performed at [facility], [address], [place], [place] [address]     Special Requests   Final    BOTTLES DRAWN AEROBIC AND ANAEROBIC Blood Culture adequate volume Performed at [facility], [address], [place], [place] [address]    Culture   Final    NO GROWTH 5 DAYS Performed at [facility], [address] [place], [place] [address]    Report Status 03/26/2023 FINAL  Final  MRSA Next Gen by PCR, Nasal     Status: None   Collection Time: 03/23/23  7:06 PM   Specimen: Nasal Mucosa; Nasal Swab  Result Value Ref Range Status   MRSA by PCR Next Gen NOT DETECTED NOT DETECTED Final    Comment: (NOTE) The GeneXpert MRSA Assay (FDA approved for NASAL specimens only), is one component of a comprehensive MRSA colonization surveillance program. It is not intended to diagnose MRSA infection nor to guide or monitor treatment for MRSA infections. Test performance is not FDA approved in patients less than 32 years old. Performed at [facility], [address] [place],  [place] [address]   SARS Coronavirus 2 by RT PCR (hospital order, performed in [facility] hospital lab) *cepheid single result test* Nasal Mucosa     Status: None   Collection Time: 03/23/23  7:06 PM   Specimen: Nasal Mucosa; Nasal Swab  Result Value Ref Range Status   SARS Coronavirus 2 by RT PCR NEGATIVE NEGATIVE Final    Comment: (NOTE) SARS-CoV-2 target nucleic acids are NOT DETECTED.  The SARS-CoV-2 RNA is generally detectable in upper and lower respiratory specimens during the acute phase of infection. The lowest concentration of SARS-CoV-2 viral copies this assay can detect is 250 copies / mL. A negative result does not preclude SARS-CoV-2 infection and should not be used as the sole basis for treatment or other patient management decisions.  A negative result may occur with improper specimen collection / handling, submission of specimen other than nasopharyngeal swab, presence of viral mutation(s) within the areas targeted by this  assay, and inadequate number of viral copies (<250 copies / mL). A negative result must be combined with clinical observations, patient history, and epidemiological information.  Fact Sheet for Patients:   RoadLapTop.co.za  Fact Sheet for Healthcare Providers: http://kim-miller.com/  This test is not yet approved or  cleared by the [place] FDA and has been authorized for detection and/or diagnosis of SARS-CoV-2 by FDA under an Emergency Use Authorization (EUA).  This EUA will remain in effect (meaning this test can be used) for the duration of the COVID-19 declaration under Section 564(b)(1) of the Act, 21 U.S.C. section 360bbb-3(b)(1), unless the authorization is terminated or revoked sooner.  Performed at [facility], [address] [place], [place] [address]   Respiratory (~20 pathogens) panel by PCR     Status: Abnormal   Collection Time: 03/23/23  7:06 PM   Specimen: Nasopharyngeal Swab; Respiratory  Result Value Ref Range Status   Adenovirus NOT DETECTED NOT DETECTED Final   Coronavirus 229E NOT DETECTED NOT DETECTED Final    Comment: (NOTE) The Coronavirus on the Respiratory Panel, DOES NOT test for the novel  Coronavirus (2019 nCoV)    Coronavirus HKU1 NOT DETECTED NOT DETECTED Final   Coronavirus NL63 NOT DETECTED NOT DETECTED Final   Coronavirus OC43 NOT DETECTED NOT DETECTED Final   Metapneumovirus DETECTED (A) NOT DETECTED Final   Rhinovirus / Enterovirus NOT DETECTED NOT DETECTED Final   Influenza A NOT DETECTED NOT DETECTED Final   Influenza B NOT DETECTED NOT DETECTED Final   Parainfluenza Virus 1 NOT DETECTED NOT DETECTED Final   Parainfluenza Virus 2 NOT DETECTED NOT DETECTED Final   Parainfluenza Virus 3 NOT DETECTED NOT DETECTED Final   Parainfluenza Virus 4 NOT DETECTED NOT DETECTED Final   Respiratory Syncytial Virus NOT DETECTED NOT DETECTED Final   Bordetella pertussis NOT DETECTED  NOT DETECTED Final   Bordetella Parapertussis NOT DETECTED NOT DETECTED Final   Chlamydophila pneumoniae NOT DETECTED NOT DETECTED Final   Mycoplasma pneumoniae NOT DETECTED NOT DETECTED Final    Comment: Performed at [facility], [address], [place], [place] [address]  MRSA Next Gen by PCR, Nasal     Status: None   Collection Time: 03/28/23  3:24 PM   Specimen: Nasal Mucosa; Nasal Swab  Result Value Ref Range Status   MRSA by PCR Next Gen NOT DETECTED NOT DETECTED Final    Comment: (NOTE) The GeneXpert MRSA Assay (FDA approved for NASAL specimens only), is one component of a comprehensive MRSA colonization surveillance program. It  is not intended to diagnose MRSA infection nor to guide or monitor treatment for MRSA infections. Test performance is not FDA approved in patients less than 88 years old. Performed at [facility], [address] [place], [place] [address]     Radiology Studies: No results found.  Pamella Pert, MD, PhD [facility]  Between 7 am - 7 pm I am available, please contact me via Amion (for emergencies) or Securechat (non urgent messages)  Between 7 pm - 7 am I am not available, please contact night coverage MD/APP via Amion

## 2023-03-29 NOTE — Progress Notes (Signed)
Pt did not receive Tikosyn, as he did not meat parameter requirements. Potassium Is <4 (3.8), Qtc from EKG was > 500 ( 516). Provider notified, medication not given.

## 2023-03-29 NOTE — Progress Notes (Signed)
Reviewed ECg 2 h after second (decreased) dose of dofetilide. QTc adjusted for broad/paced QRS is 478 ms in acceptable range. Continue with dofetilide 250 mcg every 12 hours (next dose at 10 PM; trying to get the timing at 10 PM and 10 AM).

## 2023-03-29 NOTE — Progress Notes (Addendum)
Patient Name: [name] Date of Encounter: 03/29/2023 [facility] Cardiologist: [name] Manges, MD   Interval Summary  .    Patient resting comfortably in recliner. Currently on 3 L oxygen via [name] denies any cardiac complaints today. Admits to feeling better with significant diuresis  Main complaint is brain fog and feeling not as mentally astute compared to his baseline.  Vital Signs .    Vitals:   03/29/23 0751 03/29/23 0800 03/29/23 0900 03/29/23 1000  BP:  126/82 99/66 103/61  Pulse:  78 74 75  Resp:  (!) 21 18 17   Temp: 97.6 F (36.4 C)     TempSrc: Oral     SpO2:  91% 90% 94%  Weight:      Height:       Intake/Output Summary (Last 24 hours) at 03/29/2023 1057 Last data filed at 03/29/2023 0904 Gross per 24 hour  Intake 120 ml  Output 1700 ml  Net -1580 ml      03/29/2023    5:00 AM 03/28/2023    2:19 PM 03/26/2023    5:44 AM  Last 3 Weights  Weight (lbs) 169 lb 8.5 oz 169 lb 8.5 oz 186 lb 4.6 oz  Weight (kg) 76.9 kg 76.9 kg 84.5 kg     Telemetry/ECG    Irregular rhythm could be due to frequent ectopy vs. atrial fibrillation, HR 70s - Personally Reviewed  Physical Exam .   GEN: No acute distress, on 3 L oxygen via [name] Neck: No JVD Cardiac: irregular rhythm, regular rate, no murmurs, rubs, or gallops.  Respiratory: Clear to auscultation bilaterally. GI: Soft, nontender, non-distended  MS: No edema  Assessment & Plan .     Acute on chronic HFrEF  Dilated cardiomyopathy s/p BiV ICD  Most recent LVEF 20% Patient net - 11 L  Weight 169 lb. Down from 191 lb on admission  Recent device interrogation showed normal device function  Patient previously on Entresto 24-26 mg BID, held due to hypotension, most recent BP 99/66 continue to hold and consider adding back once BP can tolerate  Continue Jardiance 25 mg daily Continue Toprol XL 200 mg daily  Continue spironolactone 25 mg daily  Continue torsemide 20 mg daily   Paroxysmal atrial  fibrillation/flutter QTc prolongation  QT/QTc appears longer on EKG but when corrected is closer to ~500 ms   Rate controlled with HR  in 70-80s Keep K > 4.0 and Mag > 2.0, supplement as needed  Continue Xarelto 20 mg daily  Continue Toprol XL 200 mg daily  AM dose of Tikosyn was held this morning, patient potentially be able to continue at lower dose, will discuss with MD  CAD  Patient reports no angina   Continue ASA 81 mg daily Statin held due to rhabdo, can restart at discharge    Hyperlipidemia  Home medications held due to rhabdomyolysis  Restart Crestor on discharge   Per primary Acute respiratory failure with hypoxia Rhabdomyolysis  Diabetes, diabetic foot wound Electrolyte disturbances Anxiety  AKI   For questions or updates, please contact [facility] Please consult [email or web address] for contact info under       Signed, [name] Leatherwood, PA-C   I have seen and examined the patient along with [name] Leatherwood, PA-C .  I have reviewed the chart, notes and new data.  I agree with PA/NP's note.  Key new complaints: no CV complaints, breathing comfortably Key examination changes: clear lungs, no JVD, no edema  Key new findings / data: K 3.8.  He did immediately convert to normal rhythm (sinus rhythm and atrial paced beats with BiV pacing, also very frequent PACs with native AV conduction) after the first dfetilide dose. Corrected for his very broad QRS at 194 ms, the adjusted QTc on today's tracing is 498 ms.  PLAN: Has rec'd additional K. Will drop his dofetilide dose to 250 ms and continue the dofetilide re-initiation  protocol.  [name] Fair, MD, [facility] [phone] 03/29/2023, 11:24 AM

## 2023-03-29 NOTE — Progress Notes (Signed)
Physical Therapy Treatment Patient Details Name: [name] MRN: [id number] DOB: [birth year] Today's Date: 03/29/2023   History of Present Illness 78 y.o. male admitted with rhabdomyolysis after a fall at home. Pt with medical history significant for insulin-dependent type 2 diabetes, diabetic peripheral neuropathy, hyperlipidemia, persistent atrial fibrillation on anticoagulation, ischemic cardiomyopathy EF 20% currently being treated for right plantar non-healing forefoot neuropathic ulcer, R transmetatarsal amputation 2021.    PT Comments   Pt admitted with above diagnosis.  Pt currently with functional limitations due to the deficits listed below (see PT Problem List). Pt seated in recliner when PT arrived. Pt agreeable to therapy session. Pts ex-wife Carney Bern present throughout intervention and provided some insight to PLOF and ongoing (2 yrs) concerns per non-healing wound on plantar surface of R foot and d/c planing. Pt inquired per ongoing therapy in SNF setting and remains appropriate d/c plan at this time. Pt required CGA for sit to stand  from recliner with min cues for proper UE and AD placement. Gait training in hallway with RW, R post op shoe and improved pattern and stability noted with L standard shoe donned, pt on 2 L/min and O2 saturation fluctuated 89-93% with cues for pursed lip breathing, 2 amb bouts 20 and 60 feet. Pt left seated in recliner, all needs in place on 2 L/min and visitor present. Pt will benefit from acute skilled PT to increase their independence and safety with mobility to allow discharge.      If plan is discharge home, recommend the following: A little help with walking and/or transfers;A little help with bathing/dressing/bathroom;Assistance with cooking/housework;Assist for transportation;Help with stairs or ramp for entrance   Can travel by private vehicle     Yes  Equipment Recommendations  None recommended by PT    Recommendations for Other Services        Precautions / Restrictions Precautions Precautions: Fall Precaution Comments: 3 falls in past 6 months per daughter Jae Dire Required Braces or Orthoses: Other Brace Other Brace: post op shoe RLE Restrictions Weight Bearing Restrictions Per Provider Order: Yes RLE Weight Bearing Per Provider Order: Weight bearing as tolerated Other Position/Activity Restrictions: R LE WBAT only with post op shoe on otherwise NWB without.     Mobility  Bed Mobility               General bed mobility comments: pt seated in recliner when PT arrived and returned to recliner at end of therpay session    Transfers Overall transfer level: Needs assistance Equipment used: Rolling walker (2 wheels) Transfers: Sit to/from Stand Sit to Stand: Contact guard assist           General transfer comment: cues for proper UE and AD placement    Ambulation/Gait Ambulation/Gait assistance: Contact guard assist Gait Distance (Feet): 60 Feet Assistive device: Rolling walker (2 wheels) Gait Pattern/deviations: Step-to pattern, Decreased stride length, Step-through pattern, Decreased stance time - right Gait velocity: decreased     General Gait Details: cues for safety, posture and proper distance from RW, gait initally with R post op shoe and gripper sock on L for 20 feet with noted lateral sway and shuffling pattern, PT inquired per standard shoe for L foot, shoe donned and noted improved stride length, ability to attend to midline and stability for an additional 60 feet. pt on 2 L/min with gait tasks today and O2 saturation 89-93% with cues for coordinated breathing   Stairs  Wheelchair Mobility     Tilt Bed    Modified Rankin (Stroke Patients Only)       Balance Overall balance assessment: Needs assistance, History of Falls Sitting-balance support: No upper extremity supported, Feet supported Sitting balance-Leahy Scale: Good     Standing balance support: Bilateral upper  extremity supported, Rel[place] on assistive device for balance, During functional activity Standing balance-Leahy Scale: Poor                              Cognition Arousal: Alert Behavior During Therapy: WFL for tasks assessed/performed Overall Cognitive Status: Within Functional Limits for tasks assessed                                 General Comments: HOH, has B hearing aids        Exercises      General Comments        Pertinent Vitals/Pain Pain Assessment Pain Assessment: No/denies pain Breathing: normal Negative Vocalization: none Facial Expression: smiling or inexpressive Body Language: relaxed Consolability: no need to console PAINAD Score: 0    Home Living                          Prior Function            PT Goals (current goals can now be found in the care plan section) Acute Rehab PT Goals Patient Stated Goal: hopes to DC home PT Goal Formulation: With patient Time For Goal Achievement: 04/06/23 Potential to Achieve Goals: Good Progress towards PT goals: Progressing toward goals    Frequency    Min 1X/week      PT Plan      Co-evaluation              AM-PAC PT "6 Clicks" Mobility   Outcome Measure  Help needed turning from your back to your side while in a flat bed without using bedrails?: None Help needed moving from lying on your back to sitting on the side of a flat bed without using bedrails?: A Little Help needed moving to and from a bed to a chair (including a wheelchair)?: A Little Help needed standing up from a chair using your arms (e.g., wheelchair or bedside chair)?: A Little Help needed to walk in hospital room?: A Little Help needed climbing 3-5 steps with a railing? : A Lot 6 Click Score: 18    End of Session Equipment Utilized During Treatment: Gait belt;Oxygen Activity Tolerance: Patient tolerated treatment well Patient left: in chair;with call bell/phone within reach;with  chair alarm set;with family/visitor present Nurse Communication: Mobility status PT Visit Diagnosis: Difficulty in walking, not elsewhere classified (R26.2);Unsteadiness on feet (R26.81)     Time: 1308-6578 PT Time Calculation (min) (ACUTE ONLY): 39 min  Charges:    $Gait Training: 23-37 mins $Therapeutic Activity: 8-22 mins PT General Charges $$ ACUTE PT VISIT: 1 Visit                     Johnny Bridge, PT Acute Rehab    Jacqualyn Posey 03/29/2023, 5:41 PM

## 2023-03-29 NOTE — Plan of Care (Signed)
  Problem: Coping: Goal: Ability to adjust to condition or change in health will improve Outcome: Progressing   Problem: Nutritional: Goal: Maintenance of adequate nutrition will improve Outcome: Progressing   Problem: Skin Integrity: Goal: Risk for impaired skin integrity will decrease Outcome: Progressing   Problem: Clinical Measurements: Goal: Cardiovascular complication will be avoided Outcome: Progressing   Problem: Activity: Goal: Risk for activity intolerance will decrease Outcome: Progressing   Problem: Elimination: Goal: Will not experience complications related to bowel motility Outcome: Progressing Goal: Will not experience complications related to urinary retention Outcome: Progressing   Problem: Pain Managment: Goal: General experience of comfort will improve and/or be controlled Outcome: Progressing

## 2023-03-30 DIAGNOSIS — I5043 Acute on chronic combined systolic (congestive) and diastolic (congestive) heart failure: Secondary | ICD-10-CM

## 2023-03-30 DIAGNOSIS — I480 Paroxysmal atrial fibrillation: Secondary | ICD-10-CM

## 2023-03-30 DIAGNOSIS — T796XXD Traumatic ischemia of muscle, subsequent encounter: Secondary | ICD-10-CM | POA: Diagnosis not present

## 2023-03-30 DIAGNOSIS — I4819 Other persistent atrial fibrillation: Secondary | ICD-10-CM | POA: Diagnosis not present

## 2023-03-30 LAB — COMPREHENSIVE METABOLIC PANEL
ALT: 58 U/L — ABNORMAL HIGH (ref 0–44)
AST: 27 U/L (ref 15–41)
Albumin: 3.1 g/dL — ABNORMAL LOW (ref 3.5–5.0)
Alkaline Phosphatase: 98 U/L (ref 38–126)
Anion gap: 9 (ref 5–15)
BUN: 50 mg/dL — ABNORMAL HIGH (ref 8–23)
CO2: 30 mmol/L (ref 22–32)
Calcium: 9.5 mg/dL (ref 8.9–10.3)
Chloride: 96 mmol/L — ABNORMAL LOW (ref 98–111)
Creatinine, Ser: 0.91 mg/dL (ref 0.61–1.24)
GFR, Estimated: >60 mL/min (ref >60)
Glucose, Bld: 94 mg/dL (ref 70–99)
Potassium: 4.3 mmol/L (ref 3.5–5.1)
Sodium: 135 mmol/L (ref 135–145)
Total Bilirubin: 1.1 mg/dL (ref 0.0–1.2)
Total Protein: 6.7 g/dL (ref 6.5–8.1)

## 2023-03-30 LAB — GLUCOSE, CAPILLARY
Glucose-Capillary: 107 mg/dL — ABNORMAL HIGH (ref 70–99)
Glucose-Capillary: 235 mg/dL — ABNORMAL HIGH (ref 70–99)
Glucose-Capillary: 197 mg/dL — ABNORMAL HIGH (ref 70–99)
Glucose-Capillary: 120 mg/dL — ABNORMAL HIGH (ref 70–99)

## 2023-03-30 LAB — CBC
HCT: 46.6 % (ref 39.0–52.0)
Hemoglobin: 15.4 g/dL (ref 13.0–17.0)
MCH: 29.8 pg (ref 26.0–34.0)
MCHC: 33 g/dL (ref 30.0–36.0)
MCV: 90.1 fL (ref 80.0–100.0)
Platelets: 173 10*3/uL (ref 150–400)
RBC: 5.17 MIL/uL (ref 4.22–5.81)
RDW: 13.6 % (ref 11.5–15.5)
WBC: 9.7 10*3/uL (ref 4.0–10.5)
nRBC: 0 % (ref 0.0–0.2)

## 2023-03-30 LAB — POTASSIUM: Potassium: 4.2 mmol/L (ref 3.5–5.1)

## 2023-03-30 LAB — MAGNESIUM: Magnesium: 2.4 mg/dL (ref 1.7–2.4)

## 2023-03-30 MED ORDER — TORSEMIDE 10 MG PO TABS
10.0000 mg | ORAL_TABLET | Freq: Every day | ORAL | Status: DC
  Administered 2023-03-31 – 2023-04-01 (×2): 10 mg via ORAL
  Filled 2023-03-30 (×2): qty 1

## 2023-03-30 NOTE — Plan of Care (Signed)
  Problem: Coping: Goal: Ability to adjust to condition or change in health will improve Outcome: Progressing   Problem: Metabolic: Goal: Ability to maintain appropriate glucose levels will improve Outcome: Progressing   Problem: Nutritional: Goal: Maintenance of adequate nutrition will improve Outcome: Progressing   Problem: Skin Integrity: Goal: Risk for impaired skin integrity will decrease Outcome: Progressing   Problem: Clinical Measurements: Goal: Respiratory complications will improve Outcome: Progressing   Problem: Activity: Goal: Risk for activity intolerance will decrease Outcome: Progressing   Problem: Coping: Goal: Level of anxiety will decrease Outcome: Progressing

## 2023-03-30 NOTE — Progress Notes (Addendum)
Patient Name: [name] Date of Encounter: 03/30/2023 [facility] Cardiologist: Sherryl Manges, MD   Interval Summary  .    Patient resting comfortably in the recliner. Feels good  Taken off supplemental oxygen this AM, so far doing well Denies any cardiovascular complaints Appears more alert and less drowsy when compared to yesterday  Vital Signs .    Vitals:   03/30/23 0500 03/30/23 0600 03/30/23 0700 03/30/23 0800  BP: 119/78 121/82 119/78 113/84  Pulse: 75 74 74 78  Resp: 18 (!) 21 20 19   Temp:    97.7 F (36.5 C)  TempSrc:    Oral  SpO2: 90% 94% 96% 95%  Weight:      Height:        Intake/Output Summary (Last 24 hours) at 03/30/2023 0910 Last data filed at 03/30/2023 0726 Gross per 24 hour  Intake --  Output 1075 ml  Net -1075 ml      03/29/2023    5:00 AM 03/28/2023    2:19 PM 03/26/2023    5:44 AM  Last 3 Weights  Weight (lbs) 169 lb 8.5 oz 169 lb 8.5 oz 186 lb 4.6 oz  Weight (kg) 76.9 kg 76.9 kg 84.5 kg     Telemetry/ECG    AV paced rhythm, frequent pair PVCs, HR 70-80s - Personally Reviewed  Physical Exam .   GEN: No acute distress, on room air Neck: No JVD Cardiac: RRR, no murmurs, rubs, or gallops.  Respiratory: Clear to auscultation bilaterally. GI: Soft, nontender, non-distended  MS: No edema  Assessment & Plan .     Acute on chronic HFrEF  Dilated cardiomyopathy s/p BiV ICD  Most recent LVEF 20% Patient net - 13 L  Weight 169 lb (yesterday) down from 191 lb on admission  Recent device interrogation showed normal device function  Patient previously on Entresto 24-26 mg BID, held due to hypotension, most recent BP 119/78 continue to hold and consider adding back once BP can tolerate, potentially re-add at discharge  Continue Jardiance 25 mg daily Continue Toprol XL 200 mg daily  Continue spironolactone 25 mg daily  Continue torsemide 20 mg daily    Paroxysmal atrial fibrillation/flutter QTc prolongation  QT/QTc appears  longer on EKG but when corrected it is under  Rate controlled with HR  in 70-80s Keep K > 4.0 and Mag > 2.0, supplement as needed  Continue Xarelto 20 mg daily  Continue Toprol XL 200 mg daily  Continue Tikosyn 250 mcg Q12 (10AM/10PM), most recent QTc corrected to be ~436 ms, stable from yesterday    CAD  Patient reports no angina   Continue ASA 81 mg daily Statin held due to rhabdo, can restart at discharge     Hyperlipidemia  Home medications held due to rhabdomyolysis  Restart Crestor on discharge    Per primary Acute respiratory failure with hypoxia Rhabdomyolysis  Diabetes, diabetic foot wound Anxiety  AKI  For questions or updates, please contact [facility] Please consult [email or web address] for contact info under       Signed, Olena Leatherwood, PA-C   I have seen and examined the patient along with Olena Leatherwood, PA-C , PA NP.  I have reviewed the chart, notes and new data.  I agree with PA/NP's note.  Key new complaints: no CV complaints Key examination changes: RRR, no signs of hypervolemia Key new findings / data: Telemetry shows normal rhythm (sometimes sinus, sometime A-paced, at all times with good BiV  demand pacing), but relatively frequent native conducted PACs or PVCs. QTC on noon ECG today is 576 ms (under 500 ms when adjusted for broad paced QRS)  PLAN: He will need to stay until we can review his 12 noon ECG tomorrow before we can complete his dofetilide re-load.  He will be DC'd on a lower dofetilide dose of 250 mcg Q12 hours (10 AM and 10 PM).  Back on his usual doses of Xarelto, and metoprolol succinate, with plan to restart entresto 24-26 mg twice daily and rosuvastatin 20 mg daily at DC. We have been using spironolactone here, but can restart eplerenone 50 mg daily at DC as well.  Thurmon Fair, MD, [facility] [phone] 03/30/2023, 12:23 PM

## 2023-03-30 NOTE — Progress Notes (Signed)
PROGRESS NOTE  [name] ZOX:[id number] DOB: Apr 27, 1945 DOA: 03/21/2023 PCP: Corwin Levins, MD   LOS: 8 days   Brief Narrative / Interim history: 78 y.o. male with medical history significant for insulin-dependent type 2 diabetes, diabetic peripheral neuropathy, hyperlipidemia, persistent atrial fibrillation on anticoagulation, ischemic cardiomyopathy EF 20% currently being treated for right lower extremity diabetic wound and is being admitted to the hospital with rhabdomyolysis after a fall at home.  He has been followed by Dr. Lady Gary for right lower extremity diabetic wound, recent outpatient cultures grew Proteus and has finished Levaquin course.  He was found to be hypotensive in the ED, had elevated CK and was admitted to the hospital.  Hospital course complicated by hyperkalemia, AKI, hypoxic respiratory failure due to metapneumovirus infection as well as acute on chronic CHF along with persistent a flutter in the setting of dofetilide discontinuation due to excessive QT prolongation.  Subjective / 24h Interval events: He is feeling better today.  Feels stronger.  No chest pain, no palpitations  Assesement and Plan: Principal Problem:   Rhabdomyolysis Active Problems:   PAF (paroxysmal atrial fibrillation) (HCC)   Orthostatic hypotension   Acute on chronic combined systolic and diastolic heart failure (HCC)   Persistent atrial fibrillation (HCC)   Atypical atrial flutter (HCC)   Principal problem Acute hypoxic respiratory failure-multifactorial due to acute on chronic systolic CHF as well as metapneumovirus infection.  Had respiratory decompensation 1/23 with diffuse wheezing, dyspnea requiring 6 L -Currently on 2 L, wean off to room air as tolerated. -Completed a course of steroids and a course of antibiotics -Respiratory status remains stable, he is appropriate for SNF discharge once cleared by cardiology/Tikosyn loading is completed  Active problems PAF/flutter -due to  prolonged QTc, dofetilide was discontinued, and he has recently been in persistent a flutter with reduction in deficiency of biventricular pacing.  Cardiology consulted and following, he underwent successful overdrive pacing of the A-flutter but with very frequent ectopy there is a risk of recurrence and he is now being reloaded with Tikosyn.  Management per cards, monitor in stepdown while reloading -Also continue metoprolol, anticoagulation with Xarelto -Once cleared by cardiology stable to go to SNF  CK, non-traumatic-in the setting of fall, unknown amount of time on the floor, CK now normalized   Orthostatic hypotension - Elevated lactic acid/ lactic acidosis resolved.    Acute on chronic combined CHF-most recent 2D echo March 2024 shows LVEF 20-25%.  Clinically appears euvolemic.  Continue empagliflozin, aspirin, metoprolol, spironolactone, torsemide   Generalized weakness, falls -possibly related to some medication errors, per prior hospitalist may have accidentally taken double doses.  SNF recommended, currently not stable to go  AKI - From fluid overload and hypoperfusion to kidneys. Creatinine back to baseline.    Hyponatremia - Suspect from fluid overload.  Sodium now normalized   Elevated liver enzymes -minimal, unlikely to be significant clinically  Diabetic foot wound on the right - X-ray without evidence of osteomyelitis or other complication. Follows up with Dr Lady Gary as outpatient.  Completed 10 day course of antibiotics.   DM2, with hyperglycemia -A1c 7.2 which is acceptable in his age group.  Continue sliding scale  Hyperlipidemia -resume Crestor on discharge   Hyperkalemia - Resolved   Anxiety - continue ativan.   Scheduled Meds:  aspirin EC  81 mg Oral Daily   Chlorhexidine Gluconate Cloth  6 each Topical Daily   dofetilide  250 mcg Oral BID   empagliflozin  25 mg Oral Daily  fluticasone  2 spray Each Nare Daily   insulin aspart  0-15 Units Subcutaneous TID WC    insulin aspart  0-5 Units Subcutaneous QHS   insulin aspart  4 Units Subcutaneous TID WC   insulin glargine-yfgn  14 Units Subcutaneous Daily   LORazepam  0.5 mg Oral QHS   magnesium oxide  400 mg Oral BID   metoprolol  200 mg Oral QPC supper   polyethylene glycol  17 g Oral Daily   rivaroxaban  20 mg Oral QPC supper   senna-docusate  2 tablet Oral BID   spironolactone  25 mg Oral Daily   torsemide  20 mg Oral Daily   Continuous Infusions: PRN Meds:.acetaminophen **OR** acetaminophen, benzonatate, guaiFENesin-dextromethorphan, ipratropium-albuterol, LORazepam, mouth rinse, phenol, zolpidem  Current Outpatient Medications  Medication Instructions   aspirin EC 81 mg, Oral, Daily, Swallow whole.   Cholecalciferol (VITAMIN D3 PO) 1 capsule, Daily   citalopram (CELEXA) 40 mg, Oral, Every morning   Continuous Glucose Receiver (FREESTYLE LIBRE 2 READER) DEVI Use as instructure to check blood sugar   Continuous Glucose Sensor (FREESTYLE LIBRE 2 SENSOR) MISC Apply new sensor every 14 days   Cyanocobalamin (VITAMIN B12 PO) 1 tablet, Daily   dofetilide (TIKOSYN) 500 MCG capsule TAKE 1 CAPSULE BY MOUTH 2 TIMES DAILY   empagliflozin (JARDIANCE) 25 mg, Oral, Daily   eplerenone (INSPRA) 50 mg, Oral, Every morning   FLUoxetine (PROZAC) 10 mg, Oral, Daily   fluticasone (FLONASE) 50 MCG/ACT nasal spray USE 2 SPRAYS IN EACH NOSTRIL EVERY DAY   Glucose Blood (FREESTYLE PRECISION NEO TEST VI) In Vitro   glucose blood (ONE TOUCH ULTRA TEST) test strip 1 each, Other, 2 times daily   insulin lispro (HUMALOG KWIKPEN) 100 UNIT/ML KwikPen INJECT 10-14 units under skin before meals, 3 times a day, as advised   Lancets MISC Use as directed up to 4 times per day   LANTUS SOLOSTAR 100 UNIT/ML Solostar Pen INJECT 14 UNITS UNDER THE SKIN DAILY   liraglutide (VICTOZA) 1.8 mg, Subcutaneous, Every morning   LORazepam (ATIVAN) 1 MG tablet TAKE 1/2 TABLET BY MOUTH EVERY DAY AS NEEDED FOR ANXIETY   LORazepam (ATIVAN)  0.5 mg, Oral, Daily at bedtime   Magnesium Oxide (MAG-OX 400 PO) 400 mg, Oral, 2 times daily   metoprolol (TOPROL-XL) 200 mg, Oral, Daily after supper   Multiple Vitamins-Minerals (CENTRUM SILVER 50+MEN) TABS 1 tablet, Daily with breakfast   Nitrostat 0.4 mg, Sublingual, Every 5 min PRN   Pyridoxine HCl (VITAMIN B6 PO) 1 tablet, Daily   rosuvastatin (CRESTOR) 20 MG tablet TAKE 1 TABLET BY MOUTH EVERY DAY   sacubitril-valsartan (ENTRESTO) 24-26 MG 1 tablet, Oral, 2 times daily   torsemide (DEMADEX) 10 mg, Oral, Every other day   ULTICARE MICRO PEN NEEDLES 32G X 4 MM MISC USE DAILY AS DIRECTED   XARELTO 20 MG TABS tablet TAKE 1 TABLET BY MOUTH EVERY DAY   zolpidem (AMBIEN) 5 MG tablet TAKE 1 OR 2 TABLETS BY MOUTH NIGHTLY AT BEDTIME AS NEEDED    Diet Orders (From admission, onward)     Start     Ordered   03/21/23 1730  Diet heart healthy/carb modified Room service appropriate? Yes; Fluid consistency: Thin  Diet effective now       Question Answer Comment  Diet-HS Snack? Nothing   Room service appropriate? Yes   Fluid consistency: Thin      03/21/23 1730            DVT prophylaxis:  SCDs Start: 03/21/23 1728 rivaroxaban (XARELTO) tablet 20 mg   Lab Results  Component Value Date   PLT 173 03/30/2023      Code Status: Full Code  Family Communication: No family at bedside  Status is: Inpatient Remains inpatient appropriate because: severity of illness  Level of care: Stepdown  Consultants:  Cardiology   Objective: Vitals:   03/30/23 0500 03/30/23 0600 03/30/23 0700 03/30/23 0800  BP: 119/78 121/82 119/78 113/84  Pulse: 75 74 74 78  Resp: 18 (!) 21 20 19   Temp:    97.7 F (36.5 C)  TempSrc:    Oral  SpO2: 90% 94% 96% 95%  Weight:      Height:        Intake/Output Summary (Last 24 hours) at 03/30/2023 1121 Last data filed at 03/30/2023 0726 Gross per 24 hour  Intake --  Output 1075 ml  Net -1075 ml   Wt Readings from Last 3 Encounters:  03/29/23 76.9 kg   11/21/22 87.2 kg  08/24/22 86.6 kg    Examination:  Constitutional: NAD Eyes: lids and conjunctivae normal, no scleral icterus ENMT: mmm Neck: normal, supple Respiratory: clear to auscultation bilaterally, no wheezing, no crackles. Normal respiratory effort.  Cardiovascular: Regular rate and rhythm, no murmurs / rubs / gallops. No LE edema. Abdomen: soft, no distention, no tenderness. Bowel sounds positive.   Data Reviewed: I have independently reviewed following labs and imaging studies   CBC Recent Labs  Lab 03/23/23 1917 03/24/23 0347 03/27/23 0338 03/29/23 0305 03/30/23 0339  WBC 11.5* 7.4 11.1* 10.7* 9.7  HGB 13.7 13.6 14.2 15.8 15.4  HCT 41.6 41.2 43.7 47.9 46.6  PLT 136* 119* 171 191 173  MCV 90.0 89.8 89.2 88.9 90.1  MCH 29.7 29.6 29.0 29.3 29.8  MCHC 32.9 33.0 32.5 33.0 33.0  RDW 14.4 14.1 14.0 13.8 13.6  LYMPHSABS  --  0.5* 1.5 1.7  --   MONOABS  --  0.4 1.3* 1.4*  --   EOSABS  --  0.0 0.0 0.0  --   BASOSABS  --  0.0 0.0 0.0  --     Recent Labs  Lab 03/23/23 1500 03/23/23 1917 03/23/23 1917 03/24/23 0347 03/25/23 0431 03/26/23 0413 03/26/23 0414 03/27/23 0338 03/27/23 1644 03/28/23 0415 03/29/23 0305 03/29/23 1344 03/29/23 1700 03/29/23 2339 03/30/23 0339  NA  --  128*   < > 133* 136  --    < > 134* 135 132* 136  --  133*  --  135  K  --  3.5   < > 4.0 3.9  --    < > 3.8 3.6 4.4 3.8 3.5 3.5 4.2 4.3  CL  --  92*   < > 96* 99  --    < > 94* 92* 90* 93*  --  89*  --  96*  CO2  --  21*   < > 25 25  --    < > 28 30 29 29   --  33*  --  30  GLUCOSE  --  153*   < > 161* 151*  --    < > 144* 191* 176* 147*  --  180*  --  94  BUN  --  35*   < > 32* 44*  --    < > 54* 56* 60* 57*  --  53*  --  50*  CREATININE  --  1.48*   < > 1.21 1.04  --    < >  1.15 1.07 1.16 1.20  --  1.02  --  0.91  CALCIUM  --  7.9*   < > 8.3* 8.8*  --    < > 9.3 9.5 9.4 9.7  --  9.5  --  9.5  AST  --  188*  --   --   --  86*  --  52*  --   --  31  --   --   --  27  ALT  --  130*   --   --   --  128*  --  105*  --   --  71*  --   --   --  58*  ALKPHOS  --  91  --   --   --  109  --  108  --   --  107  --   --   --  98  BILITOT  --  1.3*  --   --   --  0.9  --  1.2  --   --  1.2  --   --   --  1.1  ALBUMIN  --  3.3*  --   --   --  3.2*  --  3.2*  --   --  3.3*  --   --   --  3.1*  MG  --   --   --   --  2.5*  --   --   --   --  2.4 2.5*  --   --   --  2.4  LATICACIDVEN 1.8  --   --   --   --   --   --   --   --   --   --   --   --   --   --   BNP  --   --   --  771.7*  --   --   --   --   --   --   --   --   --   --   --    < > = values in this interval not displayed.    ------------------------------------------------------------------------------------------------------------------ No results for input(s): "CHOL", "HDL", "LDLCALC", "TRIG", "CHOLHDL", "LDLDIRECT" in the last 72 hours.  Lab Results  Component Value Date   HGBA1C 7.2 (A) 11/21/2022   ------------------------------------------------------------------------------------------------------------------ No results for input(s): "TSH", "T4TOTAL", "T3FREE", "THYROIDAB" in the last 72 hours.  Invalid input(s): "FREET3"  Cardiac Enzymes No results for input(s): "CKMB", "TROPONINI", "MYOGLOBIN" in the last 168 hours.  Invalid input(s): "CK" ------------------------------------------------------------------------------------------------------------------    Component Value Date/Time   BNP 771.7 (H) 03/24/2023 0347    CBG: Recent Labs  Lab 03/29/23 0751 03/29/23 1208 03/29/23 1630 03/29/23 2150 03/30/23 0740  GLUCAP 110* 221* 202* 173* 107*    Recent Results (from the past 240 hours)  Blood Culture (routine x 2)     Status: None   Collection Time: 03/21/23  2:05 PM   Specimen: BLOOD RIGHT FOREARM  Result Value Ref Range Status   Specimen Description   Final    BLOOD RIGHT FOREARM Performed at [facility], [address] [place], [place] [address]    Special Requests   Final     BOTTLES DRAWN AEROBIC AND ANAEROBIC Blood Culture adequate volume Performed at [facility], [address], [place], [place] [address]    Culture   Final    NO GROWTH 5 DAYS Performed at [facility], [address] [place], [place] [address]    Report Status 03/26/2023 FINAL  Final  Blood Culture (routine x 2)     Status: None   Collection Time: 03/21/23  2:15 PM   Specimen: BLOOD LEFT FOREARM  Result Value Ref Range Status   Specimen Description   Final    BLOOD LEFT FOREARM Performed at [facility], [address], [place], [place] [address]    Special Requests   Final    BOTTLES DRAWN AEROBIC AND ANAEROBIC Blood Culture adequate volume Performed at [facility], [address] [place], [place] [address]    Culture   Final    NO GROWTH 5 DAYS Performed at [facility], [address] [place], [place] [address]    Report Status 03/26/2023 FINAL  Final  MRSA Next Gen by PCR, Nasal     Status: None   Collection Time: 03/23/23  7:06 PM   Specimen: Nasal Mucosa; Nasal Swab  Result Value Ref Range Status   MRSA by PCR Next Gen NOT DETECTED NOT DETECTED Final    Comment: (NOTE) The GeneXpert MRSA Assay (FDA approved for NASAL specimens only), is one component of a comprehensive MRSA colonization surveillance program. It is not intended to diagnose MRSA infection nor to guide or monitor treatment for MRSA infections. Test performance is not FDA approved in patients less than 45 years old. Performed at [facility], [address] [place], [place] [address]   SARS Coronavirus 2 by RT PCR (hospital order, performed in [facility] hospital lab) *cepheid single result test* Nasal Mucosa     Status: None   Collection Time: 03/23/23  7:06 PM   Specimen: Nasal Mucosa; Nasal Swab  Result Value Ref Range Status   SARS Coronavirus 2 by RT PCR NEGATIVE NEGATIVE Final    Comment: (NOTE) SARS-CoV-2 target  nucleic acids are NOT DETECTED.  The SARS-CoV-2 RNA is generally detectable in upper and lower respiratory specimens during the acute phase of infection. The lowest concentration of SARS-CoV-2 viral copies this assay can detect is 250 copies / mL. A negative result does not preclude SARS-CoV-2 infection and should not be used as the sole basis for treatment or other patient management decisions.  A negative result may occur with improper specimen collection / handling, submission of specimen other than nasopharyngeal swab, presence of viral mutation(s) within the areas targeted by this assay, and inadequate number of viral copies (<250 copies / mL). A negative result must be combined with clinical observations, patient history, and epidemiological information.  Fact Sheet for Patients:   RoadLapTop.co.za  Fact Sheet for Healthcare Providers: http://kim-miller.com/  This test is not yet approved or  cleared by the [place] FDA and has been authorized for detection and/or diagnosis of SARS-CoV-2 by FDA under an Emergency Use Authorization (EUA).  This EUA will remain in effect (meaning this test can be used) for the duration of the COVID-19 declaration under Section 564(b)(1) of the Act, 21 U.S.C. section 360bbb-3(b)(1), unless the authorization is terminated or revoked sooner.  Performed at [facility], [address] [place], [place] [address]   Respiratory (~20 pathogens) panel by PCR     Status: Abnormal   Collection Time: 03/23/23  7:06 PM   Specimen: Nasopharyngeal Swab; Respiratory  Result Value Ref Range Status   Adenovirus NOT DETECTED NOT DETECTED Final   Coronavirus 229E NOT DETECTED NOT DETECTED Final    Comment: (NOTE)  The Coronavirus on the Respiratory Panel, DOES NOT test for the novel  Coronavirus (2019 nCoV)    Coronavirus HKU1 NOT DETECTED NOT DETECTED Final   Coronavirus NL63 NOT DETECTED  NOT DETECTED Final   Coronavirus OC43 NOT DETECTED NOT DETECTED Final   Metapneumovirus DETECTED (A) NOT DETECTED Final   Rhinovirus / Enterovirus NOT DETECTED NOT DETECTED Final   Influenza A NOT DETECTED NOT DETECTED Final   Influenza B NOT DETECTED NOT DETECTED Final   Parainfluenza Virus 1 NOT DETECTED NOT DETECTED Final   Parainfluenza Virus 2 NOT DETECTED NOT DETECTED Final   Parainfluenza Virus 3 NOT DETECTED NOT DETECTED Final   Parainfluenza Virus 4 NOT DETECTED NOT DETECTED Final   Respiratory Syncytial Virus NOT DETECTED NOT DETECTED Final   Bordetella pertussis NOT DETECTED NOT DETECTED Final   Bordetella Parapertussis NOT DETECTED NOT DETECTED Final   Chlamydophila pneumoniae NOT DETECTED NOT DETECTED Final   Mycoplasma pneumoniae NOT DETECTED NOT DETECTED Final    Comment: Performed at [facility], [address], [place], [place] [address]  MRSA Next Gen by PCR, Nasal     Status: None   Collection Time: 03/28/23  3:24 PM   Specimen: Nasal Mucosa; Nasal Swab  Result Value Ref Range Status   MRSA by PCR Next Gen NOT DETECTED NOT DETECTED Final    Comment: (NOTE) The GeneXpert MRSA Assay (FDA approved for NASAL specimens only), is one component of a comprehensive MRSA colonization surveillance program. It is not intended to diagnose MRSA infection nor to guide or monitor treatment for MRSA infections. Test performance is not FDA approved in patients less than 45 years old. Performed at [facility], [address], [place], [place] [address]     Radiology Studies: No results found.  Pamella Pert, MD, PhD [facility]  Between 7 am - 7 pm I am available, please contact me via Amion (for emergencies) or Securechat (non urgent messages)  Between 7 pm - 7 am I am not available, please contact night coverage MD/APP via Amion

## 2023-03-31 DIAGNOSIS — I5042 Chronic combined systolic (congestive) and diastolic (congestive) heart failure: Secondary | ICD-10-CM | POA: Diagnosis not present

## 2023-03-31 DIAGNOSIS — Z5181 Encounter for therapeutic drug level monitoring: Secondary | ICD-10-CM | POA: Diagnosis not present

## 2023-03-31 DIAGNOSIS — I5043 Acute on chronic combined systolic (congestive) and diastolic (congestive) heart failure: Secondary | ICD-10-CM | POA: Diagnosis not present

## 2023-03-31 DIAGNOSIS — I2510 Atherosclerotic heart disease of native coronary artery without angina pectoris: Secondary | ICD-10-CM | POA: Diagnosis not present

## 2023-03-31 DIAGNOSIS — Z79899 Other long term (current) drug therapy: Secondary | ICD-10-CM

## 2023-03-31 DIAGNOSIS — T796XXD Traumatic ischemia of muscle, subsequent encounter: Secondary | ICD-10-CM | POA: Diagnosis not present

## 2023-03-31 DIAGNOSIS — I480 Paroxysmal atrial fibrillation: Secondary | ICD-10-CM | POA: Diagnosis not present

## 2023-03-31 LAB — BASIC METABOLIC PANEL
Anion gap: 11 (ref 5–15)
BUN: 46 mg/dL — ABNORMAL HIGH (ref 8–23)
CO2: 30 mmol/L (ref 22–32)
Calcium: 9 mg/dL (ref 8.9–10.3)
Chloride: 92 mmol/L — ABNORMAL LOW (ref 98–111)
Creatinine, Ser: 1.06 mg/dL (ref 0.61–1.24)
GFR, Estimated: >60 mL/min (ref >60)
Glucose, Bld: 165 mg/dL — ABNORMAL HIGH (ref 70–99)
Potassium: 3.7 mmol/L (ref 3.5–5.1)
Sodium: 133 mmol/L — ABNORMAL LOW (ref 135–145)

## 2023-03-31 LAB — GLUCOSE, CAPILLARY
Glucose-Capillary: 168 mg/dL — ABNORMAL HIGH (ref 70–99)
Glucose-Capillary: 169 mg/dL — ABNORMAL HIGH (ref 70–99)
Glucose-Capillary: 204 mg/dL — ABNORMAL HIGH (ref 70–99)
Glucose-Capillary: 147 mg/dL — ABNORMAL HIGH (ref 70–99)
Glucose-Capillary: 133 mg/dL — ABNORMAL HIGH (ref 70–99)

## 2023-03-31 LAB — MAGNESIUM: Magnesium: 2.2 mg/dL (ref 1.7–2.4)

## 2023-03-31 MED ORDER — POTASSIUM CHLORIDE CRYS ER 20 MEQ PO TBCR
40.0000 meq | EXTENDED_RELEASE_TABLET | Freq: Once | ORAL | Status: AC
  Administered 2023-03-31: 40 meq via ORAL
  Filled 2023-03-31: qty 2

## 2023-03-31 MED ORDER — LORAZEPAM 0.5 MG PO TABS: 0.5000 mg | ORAL_TABLET | Freq: Two times a day (BID) | ORAL | 0 refills | Status: DC | PRN

## 2023-03-31 MED ORDER — TORSEMIDE 10 MG PO TABS: 10.0000 mg | ORAL_TABLET | Freq: Every day | ORAL | Status: DC

## 2023-03-31 MED ORDER — DOFETILIDE 250 MCG PO CAPS: 250.0000 ug | ORAL_CAPSULE | Freq: Two times a day (BID) | ORAL | Status: AC

## 2023-03-31 MED ORDER — ENTRESTO 24-26 MG PO TABS: 1.0000 | ORAL_TABLET | Freq: Two times a day (BID) | ORAL | Status: AC

## 2023-03-31 NOTE — Progress Notes (Signed)
Physical Therapy Treatment Patient Details Name: [name] MRN: [id number] DOB: [birth year] Today's Date: 03/31/2023   History of Present Illness 78 y.o. male admitted with rhabdomyolysis after a fall at home. Pt with medical history significant for insulin-dependent type 2 diabetes, diabetic peripheral neuropathy, hyperlipidemia, persistent atrial fibrillation on anticoagulation, ischemic cardiomyopathy EF 20% currently being treated for right plantar non-healing forefoot neuropathic ulcer, R transmetatarsal amputation 2021.    PT Comments   The  patient is seated in recliner. Most recent BP  82/58(56), retaken 90/50(61, standing 82/55(62). RN aware. Patient reports no  change in  dizziness.  Patient ambulated x 60' using Rw Min assistance, short shuffling steps. BP post 100/59.   Patient will benefit from continued inpatient follow up therapy, <3 hours/day.    If plan is discharge home, recommend the following: A little help with walking and/or transfers;A little help with bathing/dressing/bathroom;Assistance with cooking/housework;Assist for transportation;Help with stairs or ramp for entrance   Can travel by private vehicle     Yes  Equipment Recommendations       Recommendations for Other Services       Precautions / Restrictions Precautions Precautions: Fall Precaution Comments: BP can run low Required Braces or Orthoses: Other Brace Other Brace: post op shoe RLE Restrictions RLE Weight Bearing Per Provider Order: Weight bearing as tolerated Other Position/Activity Restrictions: R LE WBAT only with post op shoe on otherwise NWB without.     Mobility  Bed Mobility               General bed mobility comments: (P) pt seated in recliner    Transfers Overall transfer level: Needs assistance Equipment used: Rolling walker (2 wheels) Transfers: Sit to/from Stand Sit to Stand: Contact guard assist                Ambulation/Gait   Gait Distance (Feet): 60  Feet Assistive device: Rolling walker (2 wheels) Gait Pattern/deviations: Step-to pattern, Decreased stride length, Step-through pattern, Decreased stance time - right Gait velocity: decreased     General Gait Details: small shffling steps, steady  with RW   Stairs             Wheelchair Mobility     Tilt Bed    Modified Rankin (Stroke Patients Only)       Balance Overall balance assessment: Needs assistance Sitting-balance support: No upper extremity supported, Feet supported Sitting balance-Leahy Scale: Good     Standing balance support: No upper extremity supported, During functional activity Standing balance-Leahy Scale: Fair                              Cognition Arousal: Alert Behavior During Therapy: Flat affect Overall Cognitive Status: Within Functional Limits for tasks assessed                                 General Comments: HOH, has B hearing aids        Exercises      General Comments        Pertinent Vitals/Pain Pain Assessment Pain Assessment: No/denies pain    Home Living                          Prior Function            PT Goals (current goals can now be found  in the care plan section) Progress towards PT goals: Progressing toward goals    Frequency    Min 1X/week      PT Plan      Co-evaluation              AM-PAC PT "6 Clicks" Mobility   Outcome Measure  Help needed turning from your back to your side while in a flat bed without using bedrails?: None Help needed moving from lying on your back to sitting on the side of a flat bed without using bedrails?: None Help needed moving to and from a bed to a chair (including a wheelchair)?: A Little Help needed standing up from a chair using your arms (e.g., wheelchair or bedside chair)?: A Little Help needed to walk in hospital room?: A Little Help needed climbing 3-5 steps with a railing? : A Lot 6 Click Score: 19    End  of Session Equipment Utilized During Treatment: Gait belt Activity Tolerance: Patient tolerated treatment well Patient left: in chair;with call bell/phone within reach;with chair alarm set;with family/visitor present;with nursing/sitter in room Nurse Communication: Mobility status PT Visit Diagnosis: Difficulty in walking, not elsewhere classified (R26.2);Unsteadiness on feet (R26.81)     Time: 1400-1430 PT Time Calculation (min) (ACUTE ONLY): 30 min  Charges:    $Gait Training: 23-37 mins PT General Charges $$ ACUTE PT VISIT: 1 Visit                     Blanchard Kelch PT Acute Rehabilitation Services Office [phone] Weekend [phone]    Rada Hay 03/31/2023, 2:55 PM

## 2023-03-31 NOTE — Plan of Care (Signed)
  Problem: Education: Goal: Ability to describe self-care measures that may prevent or decrease complications (Diabetes Survival Skills Education) will improve Outcome: Progressing Goal: Individualized Educational Video(s) Outcome: Progressing   Problem: Coping: Goal: Ability to adjust to condition or change in health will improve Outcome: Progressing   Problem: Fluid Volume: Goal: Ability to maintain a balanced intake and output will improve Outcome: Progressing   Problem: Health Behavior/Discharge Planning: Goal: Ability to identify and utilize available resources and services will improve Outcome: Progressing Goal: Ability to manage health-related needs will improve Outcome: Progressing   Problem: Metabolic: Goal: Ability to maintain appropriate glucose levels will improve Outcome: Progressing   Problem: Nutritional: Goal: Maintenance of adequate nutrition will improve Outcome: Progressing Goal: Progress toward achieving an optimal weight will improve Outcome: Progressing   Problem: Skin Integrity: Goal: Risk for impaired skin integrity will decrease Outcome: Progressing   Problem: Tissue Perfusion: Goal: Adequacy of tissue perfusion will improve Outcome: Progressing   Problem: Education: Goal: Knowledge of General Education information will improve Description: Including pain rating scale, medication(s)/side effects and non-pharmacologic comfort measures Outcome: Progressing   Problem: Health Behavior/Discharge Planning: Goal: Ability to manage health-related needs will improve Outcome: Progressing   Problem: Clinical Measurements: Goal: Ability to maintain clinical measurements within normal limits will improve Outcome: Progressing Goal: Will remain free from infection Outcome: Progressing Goal: Diagnostic test results will improve Outcome: Progressing Goal: Respiratory complications will improve Outcome: Progressing Goal: Cardiovascular complication will  be avoided Outcome: Progressing   Problem: Activity: Goal: Risk for activity intolerance will decrease Outcome: Progressing   Problem: Nutrition: Goal: Adequate nutrition will be maintained Outcome: Progressing   Problem: Coping: Goal: Level of anxiety will decrease Outcome: Progressing   Problem: Elimination: Goal: Will not experience complications related to bowel motility Outcome: Progressing Goal: Will not experience complications related to urinary retention Outcome: Progressing   Problem: Pain Managment: Goal: General experience of comfort will improve and/or be controlled Outcome: Progressing   Problem: Safety: Goal: Ability to remain free from injury will improve Outcome: Progressing   Problem: Skin Integrity: Goal: Risk for impaired skin integrity will decrease Outcome: Progressing   Problem: Education: Goal: Ability to demonstrate management of disease process will improve Outcome: Progressing Goal: Ability to verbalize understanding of medication therapies will improve Outcome: Progressing Goal: Individualized Educational Video(s) Outcome: Progressing   Problem: Activity: Goal: Capacity to carry out activities will improve Outcome: Progressing   Problem: Cardiac: Goal: Ability to achieve and maintain adequate cardiopulmonary perfusion will improve Outcome: Progressing   Problem: Education: Goal: Knowledge of disease or condition will improve Outcome: Progressing Goal: Understanding of medication regimen will improve Outcome: Progressing Goal: Individualized Educational Video(s) Outcome: Progressing   Problem: Activity: Goal: Ability to tolerate increased activity will improve Outcome: Progressing   Problem: Cardiac: Goal: Ability to achieve and maintain adequate cardiopulmonary perfusion will improve Outcome: Progressing   Problem: Health Behavior/Discharge Planning: Goal: Ability to safely manage health-related needs after discharge will  improve Outcome: Progressing

## 2023-03-31 NOTE — Plan of Care (Signed)
  Problem: Education: Goal: Ability to describe self-care measures that may prevent or decrease complications (Diabetes Survival Skills Education) will improve Outcome: Progressing   Problem: Coping: Goal: Ability to adjust to condition or change in health will improve Outcome: Progressing   Problem: Fluid Volume: Goal: Ability to maintain a balanced intake and output will improve Outcome: Progressing   Problem: Health Behavior/Discharge Planning: Goal: Ability to identify and utilize available resources and services will improve Outcome: Progressing Goal: Ability to manage health-related needs will improve Outcome: Progressing   Problem: Metabolic: Goal: Ability to maintain appropriate glucose levels will improve Outcome: Progressing   Problem: Nutritional: Goal: Maintenance of adequate nutrition will improve Outcome: Progressing Goal: Progress toward achieving an optimal weight will improve Outcome: Progressing   Problem: Skin Integrity: Goal: Risk for impaired skin integrity will decrease Outcome: Progressing   Problem: Tissue Perfusion: Goal: Adequacy of tissue perfusion will improve Outcome: Progressing   Problem: Education: Goal: Knowledge of General Education information will improve Description: Including pain rating scale, medication(s)/side effects and non-pharmacologic comfort measures Outcome: Progressing   Problem: Health Behavior/Discharge Planning: Goal: Ability to manage health-related needs will improve Outcome: Progressing   Problem: Clinical Measurements: Goal: Ability to maintain clinical measurements within normal limits will improve Outcome: Progressing Goal: Will remain free from infection Outcome: Progressing Goal: Diagnostic test results will improve Outcome: Progressing Goal: Respiratory complications will improve Outcome: Progressing Goal: Cardiovascular complication will be avoided Outcome: Progressing   Problem: Activity: Goal:  Risk for activity intolerance will decrease Outcome: Progressing   Problem: Nutrition: Goal: Adequate nutrition will be maintained Outcome: Progressing   Problem: Coping: Goal: Level of anxiety will decrease Outcome: Progressing   Problem: Elimination: Goal: Will not experience complications related to bowel motility Outcome: Progressing Goal: Will not experience complications related to urinary retention Outcome: Progressing   Problem: Pain Managment: Goal: General experience of comfort will improve and/or be controlled Outcome: Progressing   Problem: Safety: Goal: Ability to remain free from injury will improve Outcome: Progressing   Problem: Skin Integrity: Goal: Risk for impaired skin integrity will decrease Outcome: Progressing   Problem: Education: Goal: Ability to demonstrate management of disease process will improve Outcome: Progressing Goal: Ability to verbalize understanding of medication therapies will improve Outcome: Progressing   Problem: Activity: Goal: Capacity to carry out activities will improve Outcome: Progressing   Problem: Cardiac: Goal: Ability to achieve and maintain adequate cardiopulmonary perfusion will improve Outcome: Progressing   Problem: Education: Goal: Knowledge of disease or condition will improve Outcome: Progressing Goal: Understanding of medication regimen will improve Outcome: Progressing   Problem: Activity: Goal: Ability to tolerate increased activity will improve Outcome: Progressing   Problem: Cardiac: Goal: Ability to achieve and maintain adequate cardiopulmonary perfusion will improve Outcome: Progressing   Problem: Health Behavior/Discharge Planning: Goal: Ability to safely manage health-related needs after discharge will improve Outcome: Progressing   Cindy S. Clelia Croft BSN, RN, CCRP, CCRN 03/31/2023 4:05 AM

## 2023-03-31 NOTE — TOC Progression Note (Addendum)
Transition of Care [facility]) - Progression Note    Patient Details  Name: [name] MRN: [id number] Date of Birth: [birth year]  Transition of Care [facility]) CM/SW Contact  Princella Ion, [place] Phone Number: 03/31/2023, 2:14 PM  Clinical Narrative:    CSW spoke with Tresa Endo at [facility] [phone]) who states they have a flu outbreak and need to ensure the pt's room is clean and states he can admit tomorrow. CSW's contacted [facility] who confirmed pt has a 24 hour grace period and can admit to the SNF tomorrow.   Hospitalist and RN made aware via secure chat.   Expected Discharge Plan: Skilled Nursing Facility Barriers to Discharge: Continued Medical Work up  Expected Discharge Plan and Services In-house Referral: Clinical Social Work Discharge Planning Services: Other - See comment (SNF)   Living arrangements for the past 2 months: Single Family Home Expected Discharge Date: 03/31/23                 DME Agency: NA                   Social Determinants of Health (SDOH) Interventions SDOH Screenings   Food Insecurity: No Food Insecurity (03/21/2023)  Housing: Low Risk  (03/21/2023)  Transportation Needs: No Transportation Needs (03/21/2023)  Utilities: Not At Risk (03/21/2023)  Alcohol Screen: Low Risk  (03/18/2022)  Depression (PHQ2-9): Low Risk  (08/24/2022)  Financial Resource Strain: Low Risk  (03/18/2022)  Physical Activity: Inactive (03/18/2022)  Social Connections: Socially Isolated (03/21/2023)  Stress: No Stress Concern Present (03/18/2022)  Tobacco Use: Low Risk  (03/21/2023)    Readmission Risk Interventions    03/23/2023    1:14 PM  Readmission Risk Prevention Plan  Transportation Screening Complete  PCP or Specialist Appt within 5-7 Days Complete  Home Care Screening Complete  Medication Review (RN CM) Complete

## 2023-03-31 NOTE — Progress Notes (Signed)
Occupational Therapy Treatment Patient Details Name: [name] MRN: [id number] DOB: 1945/07/06 Today's Date: 03/31/2023   History of present illness 78 y.o. male admitted with rhabdomyolysis after a fall at home. Pt with medical history significant for insulin-dependent type 2 diabetes, diabetic peripheral neuropathy, hyperlipidemia, persistent atrial fibrillation on anticoagulation, ischemic cardiomyopathy EF 20% currently being treated for right plantar non-healing forefoot neuropathic ulcer, R transmetatarsal amputation 2021.   OT comments  This 78 yo male seen today to assess for AE needs for LBADLs, pt can at this point sit and cross one leg over the other in a figure 4 position to get to his feet for washing, socks, and shoes. He is overall at [facility] when up with a RW but is limited to ~50 feet of ambulation before he starts to tire and he cannot safely gather items he would need for his ADLs or IADLs from the RW level. He will continue to benefit from acute OT with follow up from continued inpatient follow up therapy, <3 hours/day.       If plan is discharge home, recommend the following:  A little help with walking and/or transfers;A little help with bathing/dressing/bathroom;Assistance with cooking/housework;Assist for transportation   Equipment Recommendations  Other (comment) (TBD next venue)       Precautions / Restrictions Precautions Precautions: Fall Precaution Comments: 3 falls in past 6 months per daughter Jae Dire Required Braces or Orthoses: Other Brace Other Brace: post op shoe RLE Restrictions Weight Bearing Restrictions Per Provider Order: Yes RLE Weight Bearing Per Provider Order: Weight bearing as tolerated Other Position/Activity Restrictions: R LE WBAT only with post op shoe on otherwise NWB without.       Mobility Bed Mobility Overal bed mobility: Modified Independent Bed Mobility: Supine to Sit                Transfers Overall transfer level: Needs  assistance Equipment used: Rolling walker (2 wheels) Transfers: Sit to/from Stand Sit to Stand: Contact guard assist           General transfer comment: ambulation in hallway ~50 feet with RW and RA (sats showed 79% at lowest, but pt was no symptomatic and as soon as we got back to room and he sat down sats up to 97%--finger pulse oximeter and pt gripping RW). Remained on RW with sats 95-97%.Pt on 1 liter upon entry.     Balance Overall balance assessment: Needs assistance Sitting-balance support: No upper extremity supported, Feet supported Sitting balance-Leahy Scale: Good Sitting balance - Comments: can cross one leg over the other while seated EOB to doff and donn socks/shoes   Standing balance support: No upper extremity supported, During functional activity Standing balance-Leahy Scale: Fair Standing balance comment: standing to wash hands at sink                           ADL either performed or assessed with clinical judgement   ADL Overall ADL's : Needs assistance/impaired                     Lower Body Dressing: Supervision/safety;Set up Lower Body Dressing Details (indicate cue type and reason): CGA sit<>stand; pt can cross one leg over the other in a figure 4 position to get to his feet for socks and shoes Toilet Transfer: Contact guard assist;Ambulation;Comfort height toilet;Grab bars   Toileting- Clothing Manipulation and Hygiene: Contact guard assist;Sitting/lateral lean;Sit to/from stand  Extremity/Trunk Assessment Upper Extremity Assessment Upper Extremity Assessment: Overall WFL for tasks assessed            Vision Patient Visual Report: No change from baseline            Cognition Arousal: Alert Behavior During Therapy: WFL for tasks assessed/performed Overall Cognitive Status: Within Functional Limits for tasks assessed                                 General Comments: HOH, has B hearing aids                    Pertinent Vitals/ Pain       Pain Assessment Pain Assessment: No/denies pain         Frequency  Min 1X/week        Progress Toward Goals  OT Goals(current goals can now be found in the care plan section)  Progress towards OT goals: Progressing toward goals  Acute Rehab OT Goals Patient Stated Goal: to get ot rehab then home OT Goal Formulation: With patient/family Time For Goal Achievement: 04/07/23 Potential to Achieve Goals: Good         AM-PAC OT "6 Clicks" Daily Activity     Outcome Measure   Help from another person eating meals?: None Help from another person taking care of personal grooming?: A Little Help from another person toileting, which includes using toliet, bedpan, or urinal?: A Little Help from another person bathing (including washing, rinsing, drying)?: A Little Help from another person to put on and taking off regular upper body clothing?: A Little Help from another person to put on and taking off regular lower body clothing?: A Little 6 Click Score: 19    End of Session Equipment Utilized During Treatment: Gait belt;Rolling walker (2 wheels) (RA)  OT Visit Diagnosis: Unsteadiness on feet (R26.81);History of falling (Z91.81);Muscle weakness (generalized) (M62.81);Repeated falls (R29.6)   Activity Tolerance Patient tolerated treatment well   Patient Left in chair;with call bell/phone within reach;with chair alarm set   Nurse Communication  (RN observed pt ambulating with me in hallway; secure chat with her on pts O2 sats and leaving him on RA at 97%)        Time: 4098-1191 OT Time Calculation (min): 39 min  Charges: OT General Charges $OT Visit: 1 Visit OT Treatments $Self Care/Home Management : 38-52 mins  Lindon Romp OT Acute Rehabilitation Services Office [phone]    Evette Georges 03/31/2023, 9:18 AM

## 2023-03-31 NOTE — Progress Notes (Addendum)
Patient Name: [name] Date of Encounter: 03/31/2023 [facility] Cardiologist: Sherryl Manges, MD   Interval Summary  .    Patient feeling good this morning.  Denies any cardiovascular complaints  Appears at baseline mentally Patient is ready to leave the hospital  Patient would like to return to [facility] for his cardiology follow ups once he is discharged   Vital Signs .    Vitals:   03/31/23 0411 03/31/23 0500 03/31/23 0600 03/31/23 0817  BP:  110/76 115/73   Pulse:  75 75   Resp:  13 20   Temp:    98.1 F (36.7 C)  TempSrc:    Oral  SpO2:  96% 96%   Weight: 76.5 kg     Height:        Intake/Output Summary (Last 24 hours) at 03/31/2023 0831 Last data filed at 03/31/2023 6213 Gross per 24 hour  Intake 1080 ml  Output 1475 ml  Net -395 ml      03/31/2023    4:11 AM 03/29/2023    5:00 AM 03/28/2023    2:19 PM  Last 3 Weights  Weight (lbs) 168 lb 10.4 oz 169 lb 8.5 oz 169 lb 8.5 oz  Weight (kg) 76.5 kg 76.9 kg 76.9 kg     Telemetry/ECG    AV paced, HR 70s, frequent PVCs, stable from yesterday - Personally Reviewed  Physical Exam .   GEN: No acute distress, on room air Neck: No JVD Cardiac: RRR, no murmurs, rubs, or gallops.  Respiratory: Clear to auscultation bilaterally. GI: Soft, nontender, non-distended  MS: No edema  Assessment & Plan .     Acute on chronic HFrEF  Dilated cardiomyopathy s/p BiV ICD  Most recent LVEF 20% Patient net - 14 L this admission  Weight 168 lb down from 191 lb on admission  Recent device interrogation showed normal device function  Patient previously on Entresto 24-26 mg BID, held due to hypotension, most recent BP 119/78 continue to hold and re-add at discharge  Continue Jardiance 25 mg daily Continue Toprol XL 200 mg daily  Continue spironolactone 25 mg daily Continue torsemide 20 mg daily    Paroxysmal atrial fibrillation/flutter QTc prolongation  QT/QTc appears longer on EKG but when corrected it is under   Rate controlled with HR  in 70-80s Keep K > 4.0 and Mag > 2.0, supplement as needed  Continue Xarelto 20 mg daily  Continue Toprol XL 200 mg daily  Continue Tikosyn 250 mcg Q12 (10AM/10PM), most recent QTc corrected to be ~444 ms, stable from yesterday  Patient okay for discharge after morning dose of Tikosyn and after evaluation of 12 PM EKG   CAD  Patient reports no angina   Continue ASA 81 mg daily Statin held due to rhabdo, can restart at discharge     Hyperlipidemia  Home medications held due to rhabdomyolysis  Restart Crestor on discharge    Per primary Acute respiratory failure with hypoxia Rhabdomyolysis  Diabetes, diabetic foot wound Anxiety  AKI  For questions or updates, please contact [facility] Please consult [email or web address] for contact info under        Signed, Olena Leatherwood, PA-C   I have seen and examined the patient along with Olena Leatherwood, PA-C.  I have reviewed the chart, notes and new data.  I agree with PA/NP's note.  Key new complaints: no CV symptoms Key examination changes: clinically euvolemic, RRR Key new findings / data: A  paced-BiV paced about 99% of the time on monitor, improved from last couple of days. No AFib recurrence.  QTc interval adjusted for broad QRS complex remains in acceptable range.  PLAN: [facility] will sign off.   Medication Recommendations:   Dofetilide 250 mcg every 12 hours (new dose) Empagliflozin 25 mg daily Magnesium oxide 400 mg twice daily Metoprolol succinate 200 mg daily Rivaroxaban 20 mg daily Torsemide 10 mg daily Spironolactone 25 mg daily Entresto 24-26 mg twice daily, hold if systolic blood pressure less than 100 mmHg. Other recommendations (labs, testing, etc): Per primary team Follow up as an outpatient: he will follow-up with his primary cardiologist, Dr. Devonne Doughty at [facility], [place], [facility] [phone] 03/31/2023, 11:28  AM

## 2023-03-31 NOTE — Discharge Summary (Signed)
Physician Discharge Summary  MAURISIO RUDDY ION:[id number] DOB: Apr 27, 1945 DOA: 03/21/2023  PCP: Corwin Levins, MD  Admit date: 03/21/2023 Discharge date: 04/01/2023  Admitted From: home Disposition:  SNF  Recommendations for Outpatient Follow-up:  Follow up with PCP in 1-2 weeks Follow-up with [facility] cardiology within the next 1 to 2 weeks  Home Health: none Equipment/Devices: none  Discharge Condition: stable CODE STATUS: Full code Diet Orders (From admission, onward)     Start     Ordered   03/21/23 1730  Diet heart healthy/carb modified Room service appropriate? Yes; Fluid consistency: Thin  Diet effective now       Question Answer Comment  Diet-HS Snack? Nothing   Room service appropriate? Yes   Fluid consistency: Thin      03/21/23 1730            HPI: Per admitting MD, [name] is a 78 y.o. male with medical history significant for insulin-dependent type 2 diabetes, diabetic peripheral neuropathy, hyperlipidemia, persistent atrial fibrillation on anticoagulation, ischemic cardiomyopathy EF 20% currently being treated for right lower extremity diabetic wound and is being admitted to the hospital with rhabdomyolysis after a fall at home today.  History is provided by the patient's daughter who is at the bedside, she takes him to most of his medical appointments.  He has been followed by Dr. Lady Gary for a right lower extremity diabetic wound, recent outpatient cultures grew Proteus and he has been placed on oral Levaquin.  He was just seen in wound care office yesterday and had a boot placed.  He tells me that he has been feeling sort of dizzy and lightheaded the last few days, yesterday after he came home from his appointment he got lightheaded and fell, hitting the side of his head on his coffee table.  He was unable to get up off the ground for several hours.  He takes several heart failure medications at home, his daughter states that he sometimes forgets what medications  he is taking and ends up taking extra doses, etc.  They mention that he vomited a couple of times yesterday, but denies any chest pain, fevers, diarrhea or other concerns.  Most recent blood pressure in the ER 70s over 40s, he feels weak but denies dizziness or lightheadedness.  Workup in the emergency department is actually quite benign, though he is significantly hypotensive, and has elevated CK.  He was given small bolus of 500 cc, and hospitalist admission was requested.   Hospital Course / Discharge diagnoses: Principal Problem:   Rhabdomyolysis Active Problems:   PAF (paroxysmal atrial fibrillation) (HCC)   Orthostatic hypotension   Acute on chronic combined systolic and diastolic heart failure (HCC)   Persistent atrial fibrillation (HCC)   Atypical atrial flutter (HCC)   Paroxysmal atrial fibrillation (HCC)   Principal problem Acute hypoxic respiratory failure-multifactorial due to acute on chronic systolic CHF as well as metapneumovirus infection.  Had respiratory decompensation 1/23 with diffuse wheezing, dyspnea requiring 6 L, but now has improved, on minimal oxygen 1 to 2 L, he has completed a course of steroids as well as a course of empiric antibiotics.   Active problems PAF/flutter -due to prolonged QTc, dofetilide was discontinued, and he has recently been in persistent a flutter with reduction in the frequency of biventricular pacing.  Cardiology consulted and following, he underwent successful overdrive pacing of the A-flutter but with very frequent ectopy there is a risk of recurrence and he is now status post reloading  with Tikosyn.  Dose was decreased to 250 mg twice daily, continue Q12 h CK, non-traumatic-in the setting of fall, unknown amount of time on the floor, CK now normalized Orthostatic hypotension - Elevated lactic acid/ lactic acidosis resolved.  Acute on chronic combined CHF-most recent 2D echo March 2024 shows LVEF 20-25%.  Clinically appears euvolemic.  Continue  regimen as below, please hold entresto for SBP < 100  Generalized weakness, falls -possibly related to some medication errors, per prior hospitalist may have accidentally taken double doses.   AKI - From fluid overload and hypoperfusion to kidneys. Creatinine back to baseline.  Hyponatremia - Suspect from fluid overload.  Sodium now normalized Elevated liver enzymes -minimal, unlikely to be significant clinically Diabetic foot wound on the right - X-ray without evidence of osteomyelitis or other complication. Follows up with Dr Lady Gary as outpatient.  Completed 10 day course of antibiotics.  DM2, with hyperglycemia -A1c 7.2 which is acceptable in his age group.  Continue sliding scale Depression-discussed with pharmacy, due to QT prolongation and that he was on 2 SSRIs, continue fluoxetine alone and dc citalopram Hyperlipidemia -resume Crestor on discharge Hyperkalemia - Resolved Anxiety - continue ativan.   Sepsis ruled out   Discharge Instructions   Allergies as of 04/01/2023       Reactions   Salmon [fish Allergy] Other (See Comments)   Only canned/ not fresh (probably preservatives)   Sulfa Antibiotics Other (See Comments)   Not sure he has an allergy to this (??)   Metformin And Related Diarrhea        Medication List     STOP taking these medications    citalopram 40 MG tablet Commonly known as: CELEXA   levofloxacin 750 MG tablet Commonly known as: LEVAQUIN       TAKE these medications    aspirin EC 81 MG tablet Take 81 mg by mouth daily. Swallow whole.   Centrum Silver 50+Men Tabs Take 1 tablet by mouth daily with breakfast.   dofetilide 250 MCG capsule Commonly known as: TIKOSYN Take 1 capsule (250 mcg total) by mouth every 12 (twelve) hours. What changed:  medication strength how much to take when to take this   Entresto 24-26 MG Generic drug: sacubitril-valsartan Take 1 tablet by mouth 2 (two) times daily. Please hold for SBP < 100 What changed:  additional instructions   eplerenone 50 MG tablet Commonly known as: INSPRA Take 50 mg by mouth in the morning.   FLUoxetine 10 MG capsule Commonly known as: PROZAC Take 1 capsule (10 mg total) by mouth daily.   fluticasone 50 MCG/ACT nasal spray Commonly known as: FLONASE USE 2 SPRAYS IN EACH NOSTRIL EVERY DAY What changed:  when to take this reasons to take this   FreeStyle Libre 2 Reader Hardie Pulley Use as instructure to check blood sugar   FreeStyle Libre 2 Sensor Misc Apply new sensor every 14 days What changed:  how much to take how to take this when to take this additional instructions   FREESTYLE PRECISION NEO TEST VI by In Vitro route.   glucose blood test strip Commonly known as: ONE TOUCH ULTRA TEST 1 each by Other route 2 (two) times daily.   insulin lispro 100 UNIT/ML KwikPen Commonly known as: HumaLOG KwikPen INJECT 10-14 units under skin before meals, 3 times a day, as advised What changed:  how much to take how to take this when to take this additional instructions   Jardiance 25 MG Tabs tablet Generic drug: empagliflozin  Take 25 mg by mouth daily.   Lancets Misc Use as directed up to 4 times per day   Lantus SoloStar 100 UNIT/ML Solostar Pen Generic drug: insulin glargine INJECT 14 UNITS UNDER THE SKIN DAILY What changed: See the new instructions.   liraglutide 18 MG/3ML Sopn Commonly known as: VICTOZA Inject 1.8 mg into the skin in the morning.   LORazepam 0.5 MG tablet Commonly known as: ATIVAN Take 1 tablet (0.5 mg total) by mouth 2 (two) times daily as needed for anxiety. What changed:  medication strength See the new instructions. Another medication with the same name was removed. Continue taking this medication, and follow the directions you see here.   MAG-OX 400 PO Take 400 mg by mouth 2 (two) times daily.   metoprolol 200 MG 24 hr tablet Commonly known as: TOPROL-XL Take 200 mg by mouth daily after supper.   Nitrostat 0.4  MG SL tablet Generic drug: nitroGLYCERIN Place 1 tablet (0.4 mg total) under the tongue every 5 (five) minutes as needed for chest pain.   rosuvastatin 20 MG tablet Commonly known as: CRESTOR TAKE 1 TABLET BY MOUTH EVERY DAY What changed: when to take this   torsemide 10 MG tablet Commonly known as: DEMADEX Take 1 tablet (10 mg total) by mouth daily. What changed: when to take this   UltiCare Micro Pen Needles 32G X 4 MM Misc Generic drug: Insulin Pen Needle USE DAILY AS DIRECTED   VITAMIN B12 PO Take 1 tablet by mouth daily.   VITAMIN B6 PO Take 1 tablet by mouth daily.   VITAMIN D3 PO Take 1 capsule by mouth daily.   Xarelto 20 MG Tabs tablet Generic drug: rivaroxaban TAKE 1 TABLET BY MOUTH EVERY DAY What changed:  how much to take when to take this   zolpidem 5 MG tablet Commonly known as: AMBIEN TAKE 1 OR 2 TABLETS BY MOUTH NIGHTLY AT BEDTIME AS NEEDED What changed:  how much to take how to take this when to take this additional instructions        Contact information for after-discharge care     Destination     [facility] .   Service: Skilled Nursing Contact information: [address] [place] [address] [phone]                    Consultations: Cardiology   Procedures/Studies:  DG CHEST PORT 1 VIEW Result Date: 03/23/2023 CLINICAL DATA:  Shortness of breath EXAM: PORTABLE CHEST 1 VIEW COMPARISON:  03/23/2023 FINDINGS: Left AICD remains in place, unchanged. Heart is mildly enlarged. Aortic atherosclerosis. Improving pulmonary edema pattern since prior study. Slight residual edema suspected. No effusions. No acute bony abnormality. IMPRESSION: Cardiomegaly, improving edema with slight residual edema. Electronically Signed   By: Charlett Nose M.D.   On: 03/23/2023 19:20   DG Chest Port 1 View Result Date: 03/23/2023 CLINICAL DATA:  Increasing shortness of breath EXAM: PORTABLE CHEST 1  VIEW COMPARISON:  03/21/2023 FINDINGS: Stable cardiomegaly. Left atrial appendage occlusion device. Left chest wall CRT-D. Hazy airspace opacities in a perihilar and lower lung distribution. No definite pleural effusion. No pneumothorax. IMPRESSION: Cardiomegaly with pulmonary edema. Electronically Signed   By: Minerva Fester M.D.   On: 03/23/2023 03:53   CT Head Wo Contrast Result Date: 03/21/2023 CLINICAL DATA:  Head trauma. EXAM: CT HEAD WITHOUT CONTRAST TECHNIQUE: Contiguous axial images were obtained from the base of the skull through the vertex without intravenous  contrast. RADIATION DOSE REDUCTION: This exam was performed according to the departmental dose-optimization program which includes automated exposure control, adjustment of the mA and/or kV according to patient size and/or use of iterative reconstruction technique. COMPARISON:  Head CT dated 03/19/2017. FINDINGS: Brain: The ventricles and sulci are appropriate size for the patient's age. The gray-white matter discrimination is preserved. There is no acute intracranial hemorrhage. No mass effect or midline shift. No extra-axial fluid collection. Vascular: No hyperdense vessel or unexpected calcification. Skull: Normal. Negative for fracture or focal lesion. Sinuses/Orbits: No acute finding. Other: None IMPRESSION: No acute intracranial pathology. Electronically Signed   By: Elgie Collard M.D.   On: 03/21/2023 16:41   DG Foot Complete Right Result Date: 03/21/2023 CLINICAL DATA:  Questionable sepsis-evaluate for abnormality. History of foot infection with recent antibiotics. EXAM: RIGHT FOOT COMPLETE - 3+ VIEW COMPARISON:  None Available. 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 No evidence of acute fracture, dislocation or recurrent osteomyelitis. Mild midfoot degenerative changes. There are bidirectional calcaneal spurs. Soft tissue evaluation  limited by overlying bandages. No foreign body or definite soft tissue emphysema identified. IMPRESSION: Postsurgical changes consistent with previous transmetatarsal amputation. No acute osseous findings or definite evidence of recurrent osteomyelitis. Electronically Signed   By: Carey Bullocks M.D.   On: 03/21/2023 14:34   DG Chest Port 1 View Result Date: 03/21/2023 CLINICAL DATA:  Questionable sepsis. Altered mental status. Nausea. EXAM: PORTABLE CHEST 1 VIEW COMPARISON:  Radiographs 08/09/2021 and 03/18/2017. FINDINGS: 1357 hours. Left subclavian AICD leads appear unchanged. The heart size and mediastinal contours are stable with aortic atherosclerosis and a left atrial appendage clip. The lungs are clear. There is no pleural effusion or pneumothorax. No acute osseous findings are evident. Telemetry leads overlie the chest. IMPRESSION: Stable postoperative chest. No evidence of active cardiopulmonary process. Electronically Signed   By: Carey Bullocks M.D.   On: 03/21/2023 14:32     Subjective: - no chest pain, shortness of breath, no abdominal pain, nausea or vomiting.   Discharge Exam: BP 112/73   Pulse 74   Temp 97.8 F (36.6 C) (Oral)   Resp 18   Ht 5\' 10"  (1.778 m)   Wt 74.9 kg   SpO2 93%   BMI 23.69 kg/m   General: Pt is alert, awake, not in acute distress Cardiovascular: RRR, S1/S2 +, no rubs, no gallops Respiratory: CTA bilaterally, no wheezing, no rhonchi Abdominal: Soft, NT, ND, bowel sounds + Extremities: no edema, no cyanosis    The results of significant diagnostics from this hospitalization (including imaging, microbiology, ancillary and laboratory) are listed below for reference.     Microbiology: Recent Results (from the past 240 hours)  MRSA Next Gen by PCR, Nasal     Status: None   Collection Time: 03/23/23  7:06 PM   Specimen: Nasal Mucosa; Nasal Swab  Result Value Ref Range Status   MRSA by PCR Next Gen NOT DETECTED NOT DETECTED Final    Comment:  (NOTE) The GeneXpert MRSA Assay (FDA approved for NASAL specimens only), is one component of a comprehensive MRSA colonization surveillance program. It is not intended to diagnose MRSA infection nor to guide or monitor treatment for MRSA infections. Test performance is not FDA approved in patients less than 18 years old. Performed at [facility], [address], [place], [place] [address]   SARS Coronavirus 2 by RT PCR (hospital order, performed in [facility] hospital  lab) *cepheid single result test* Nasal Mucosa     Status: None   Collection Time: 03/23/23  7:06 PM   Specimen: Nasal Mucosa; Nasal Swab  Result Value Ref Range Status   SARS Coronavirus 2 by RT PCR NEGATIVE NEGATIVE Final    Comment: (NOTE) SARS-CoV-2 target nucleic acids are NOT DETECTED.  The SARS-CoV-2 RNA is generally detectable in upper and lower respiratory specimens during the acute phase of infection. The lowest concentration of SARS-CoV-2 viral copies this assay can detect is 250 copies / mL. A negative result does not preclude SARS-CoV-2 infection and should not be used as the sole basis for treatment or other patient management decisions.  A negative result may occur with improper specimen collection / handling, submission of specimen other than nasopharyngeal swab, presence of viral mutation(s) within the areas targeted by this assay, and inadequate number of viral copies (<250 copies / mL). A negative result must be combined with clinical observations, patient history, and epidemiological information.  Fact Sheet for Patients:   RoadLapTop.co.za  Fact Sheet for Healthcare Providers: http://kim-miller.com/  This test is not yet approved or  cleared by the [place] FDA and has been authorized for detection and/or diagnosis of SARS-CoV-2 by FDA under an Emergency Use Authorization (EUA).  This EUA will remain in effect (meaning this  test can be used) for the duration of the COVID-19 declaration under Section 564(b)(1) of the Act, 21 U.S.C. section 360bbb-3(b)(1), unless the authorization is terminated or revoked sooner.  Performed at [facility], [address] [place], [place] [address]   Respiratory (~20 pathogens) panel by PCR     Status: Abnormal   Collection Time: 03/23/23  7:06 PM   Specimen: Nasopharyngeal Swab; Respiratory  Result Value Ref Range Status   Adenovirus NOT DETECTED NOT DETECTED Final   Coronavirus 229E NOT DETECTED NOT DETECTED Final    Comment: (NOTE) The Coronavirus on the Respiratory Panel, DOES NOT test for the novel  Coronavirus (2019 nCoV)    Coronavirus HKU1 NOT DETECTED NOT DETECTED Final   Coronavirus NL63 NOT DETECTED NOT DETECTED Final   Coronavirus OC43 NOT DETECTED NOT DETECTED Final   Metapneumovirus DETECTED (A) NOT DETECTED Final   Rhinovirus / Enterovirus NOT DETECTED NOT DETECTED Final   Influenza A NOT DETECTED NOT DETECTED Final   Influenza B NOT DETECTED NOT DETECTED Final   Parainfluenza Virus 1 NOT DETECTED NOT DETECTED Final   Parainfluenza Virus 2 NOT DETECTED NOT DETECTED Final   Parainfluenza Virus 3 NOT DETECTED NOT DETECTED Final   Parainfluenza Virus 4 NOT DETECTED NOT DETECTED Final   Respiratory Syncytial Virus NOT DETECTED NOT DETECTED Final   Bordetella pertussis NOT DETECTED NOT DETECTED Final   Bordetella Parapertussis NOT DETECTED NOT DETECTED Final   Chlamydophila pneumoniae NOT DETECTED NOT DETECTED Final   Mycoplasma pneumoniae NOT DETECTED NOT DETECTED Final    Comment: Performed at [facility], [address] [place], [place] [address]  MRSA Next Gen by PCR, Nasal     Status: None   Collection Time: 03/28/23  3:24 PM   Specimen: Nasal Mucosa; Nasal Swab  Result Value Ref Range Status   MRSA by PCR Next Gen NOT DETECTED NOT DETECTED Final    Comment: (NOTE) The GeneXpert MRSA Assay (FDA approved for NASAL  specimens only), is one component of a comprehensive MRSA colonization surveillance program. It is not intended to diagnose MRSA infection nor to guide or monitor treatment for MRSA infections. Test  performance is not FDA approved in patients less than 35 years old. Performed at [facility], [address] [place], [place] [address]      Labs: Basic Metabolic Panel: Recent Labs  Lab 03/28/23 0415 03/29/23 0305 03/29/23 1344 03/29/23 1700 03/29/23 2339 03/30/23 0339 03/31/23 0329  NA 132* 136  --  133*  --  135 133*  K 4.4 3.8 3.5 3.5 4.2 4.3 3.7  CL 90* 93*  --  89*  --  96* 92*  CO2 29 29  --  33*  --  30 30  GLUCOSE 176* 147*  --  180*  --  94 165*  BUN 60* 57*  --  53*  --  50* 46*  CREATININE 1.16 1.20  --  1.02  --  0.91 1.06  CALCIUM 9.4 9.7  --  9.5  --  9.5 9.0  MG 2.4 2.5*  --   --   --  2.4 2.2   Liver Function Tests: Recent Labs  Lab 03/26/23 0413 03/27/23 0338 03/29/23 0305 03/30/23 0339  AST 86* 52* 31 27  ALT 128* 105* 71* 58*  ALKPHOS 109 108 107 98  BILITOT 0.9 1.2 1.2 1.1  PROT 6.7 7.1 7.4 6.7  ALBUMIN 3.2* 3.2* 3.3* 3.1*   CBC: Recent Labs  Lab 03/27/23 0338 03/29/23 0305 03/30/23 0339  WBC 11.1* 10.7* 9.7  NEUTROABS 8.2* 7.5  --   HGB 14.2 15.8 15.4  HCT 43.7 47.9 46.6  MCV 89.2 88.9 90.1  PLT 171 191 173   CBG: Recent Labs  Lab 03/31/23 0808 03/31/23 1222 03/31/23 1417 03/31/23 1724 03/31/23 2225  GLUCAP 168* 169* 204* 147* 133*   Hgb A1c No results for input(s): "HGBA1C" in the last 72 hours. Lipid Profile No results for input(s): "CHOL", "HDL", "LDLCALC", "TRIG", "CHOLHDL", "LDLDIRECT" in the last 72 hours. Thyroid function studies No results for input(s): "TSH", "T4TOTAL", "T3FREE", "THYROIDAB" in the last 72 hours.  Invalid input(s): "FREET3" Urinalysis    Component Value Date/Time   COLORURINE YELLOW 03/21/2023 1532   APPEARANCEUR CLEAR 03/21/2023 1532   LABSPEC 1.028 03/21/2023 1532    PHURINE 6.0 03/21/2023 1532   GLUCOSEU >=500 (A) 03/21/2023 1532   GLUCOSEU >=1000 (A) 08/24/2022 1003   HGBUR NEGATIVE 03/21/2023 1532   BILIRUBINUR NEGATIVE 03/21/2023 1532   KETONESUR NEGATIVE 03/21/2023 1532   PROTEINUR NEGATIVE 03/21/2023 1532   UROBILINOGEN 0.2 08/24/2022 1003   NITRITE NEGATIVE 03/21/2023 1532   LEUKOCYTESUR NEGATIVE 03/21/2023 1532    FURTHER DISCHARGE INSTRUCTIONS:   Get Medicines reviewed and adjusted: Please take all your medications with you for your next visit with your Primary MD   Laboratory/radiological data: Please request your Primary MD to go over all hospital tests and procedure/radiological results at the follow up, please ask your Primary MD to get all Hospital records sent to his/her office.   In some cases, they will be blood work, cultures and biopsy results pending at the time of your discharge. Please request that your primary care M.D. goes through all the records of your hospital data and follows up on these results.   Also Note the following: If you experience worsening of your admission symptoms, develop shortness of breath, life threatening emergency, suicidal or homicidal thoughts you must seek medical attention immediately by calling 911 or calling your MD immediately  if symptoms less severe.   You must read complete instructions/literature along with all the possible adverse reactions/side effects for all the Medicines you take  and that have been prescribed to you. Take any new Medicines after you have completely understood and accpet all the possible adverse reactions/side effects.    Do not drive when taking Pain medications or sleeping medications (Benzodaizepines)   Do not take more than prescribed Pain, Sleep and Anxiety Medications. It is not advisable to combine anxiety,sleep and pain medications without talking with your primary care practitioner   Special Instructions: If you have smoked or chewed Tobacco  in the last 2 yrs  please stop smoking, stop any regular Alcohol  and or any Recreational drug use.   Wear Seat belts while driving.   Please note: You were cared for by a hospitalist during your hospital stay. Once you are discharged, your primary care physician will handle any further medical issues. Please note that NO REFILLS for any discharge medications will be authorized once you are discharged, as it is imperative that you return to your primary care physician (or establish a relationship with a primary care physician if you do not have one) for your post hospital discharge needs so that they can reassess your need for medications and monitor your lab values.  Time coordinating discharge: 35 minutes  SIGNED:  Pamella Pert, MD, PhD 04/01/2023, 7:02 AM

## 2023-04-01 LAB — GLUCOSE, CAPILLARY
Glucose-Capillary: 103 mg/dL — ABNORMAL HIGH (ref 70–99)
Glucose-Capillary: 226 mg/dL — ABNORMAL HIGH (ref 70–99)

## 2023-04-01 NOTE — TOC Transition Note (Addendum)
Transition of Care [facility]) - Discharge Note   Patient Details  Name: [name] MRN: [id number] Date of Birth: Mar 10, 1945  [facility] CM/SW Contact:  Lanier Clam, RN Phone Number: 04/01/2023, 10:56 AM   Clinical Narrative: Left vm w/[facility] rep Tresa Endo [phone]-await return call for safe d/c to [facility].d/c summary sent via hub.   -11:38a-TC again [facility] spoke Sam agreed to patient coming rm#[phone], report # back hall nurse [phone]. PTAR called No further CM needs.    Final next level of care: Skilled Nursing Facility Barriers to Discharge:  (awaiting return call from facility for s/afe d/c back [facility].)   Patient Goals and CMS Choice Patient states their goals for this hospitalization and ongoing recovery are:: To return home to baseline   Choice offered to / list presented to : Patient  ownership interest in [name].provided to:: Patient    Discharge Placement                       Discharge Plan and Services Additional resources added to the After Visit Summary for   In-house Referral: Clinical Social Work Discharge Planning Services: Other - See comment (SNF)              DME Agency: NA                  Social Drivers of Health (SDOH) Interventions SDOH Screenings   Food Insecurity: No Food Insecurity (03/21/2023)  Housing: Low Risk  (03/21/2023)  Transportation Needs: No Transportation Needs (03/21/2023)  Utilities: Not At Risk (03/21/2023)  Alcohol Screen: Low Risk  (03/18/2022)  Depression (PHQ2-9): Low Risk  (08/24/2022)  Financial Resource Strain: Low Risk  (03/18/2022)  Physical Activity: Inactive (03/18/2022)  Social Connections: Socially Isolated (03/21/2023)  Stress: No Stress Concern Present (03/18/2022)  Tobacco Use: Low Risk  (03/21/2023)     Readmission Risk Interventions    03/23/2023    1:14 PM  Readmission Risk Prevention Plan  Transportation Screening Complete   PCP or Specialist Appt within 5-7 Days Complete  Home Care Screening Complete  Medication Review (RN CM) Complete

## 2023-04-01 NOTE — Progress Notes (Signed)
Patient seen and examined this morning, could not go to SNF yesterday due to logistical issues, he has remained stable for discharge.  Please see updated discharge summary dated 03/31/2023  Karilyn Wind M. Elvera Lennox, MD, PhD [facility]  Between 7 am - 7 pm you can contact me via Amion (for emergencies) or Securechat (non urgent matters).  I am not available 7 pm - 7 am, please contact night coverage MD/APP via Amion

## 2023-04-01 NOTE — Progress Notes (Signed)
Patient leaving with PTAR, all necessary forms sent with transport, including scripts. Family at bedside currently.

## 2023-04-01 NOTE — Progress Notes (Signed)
03/31/2023  Patient sitting up in chair, speaking with daughter by phone. Upset about not being able to leave hospital today and requesting medications (lorazepam and Ambien) to be given now. Patient says he may choose to leave the hospital tonight, doesn't want to stay any longer. Contacted Anthoney Harada NP re. above and verbal order to allow meds to be given early was received. Medications administered and charge nurse Oren Beckmann RN spoke with patient about his concerns as well. Current plan is to have patient discharged to SNF tomorrow. Patient is in agreement with this plan.  Cindy S. Clelia Croft BSN, RN, CCRP, CCRN 04/01/2023 3:12 AM

## 2023-04-01 NOTE — Plan of Care (Signed)
  Problem: Education: Goal: Ability to describe self-care measures that may prevent or decrease complications (Diabetes Survival Skills Education) will improve Outcome: Progressing   Problem: Coping: Goal: Ability to adjust to condition or change in health will improve Outcome: Progressing   Problem: Fluid Volume: Goal: Ability to maintain a balanced intake and output will improve Outcome: Progressing   Problem: Health Behavior/Discharge Planning: Goal: Ability to identify and utilize available resources and services will improve Outcome: Progressing Goal: Ability to manage health-related needs will improve Outcome: Progressing   Problem: Metabolic: Goal: Ability to maintain appropriate glucose levels will improve Outcome: Progressing   Problem: Nutritional: Goal: Maintenance of adequate nutrition will improve Outcome: Progressing Goal: Progress toward achieving an optimal weight will improve Outcome: Progressing   Problem: Skin Integrity: Goal: Risk for impaired skin integrity will decrease Outcome: Progressing   Problem: Tissue Perfusion: Goal: Adequacy of tissue perfusion will improve Outcome: Progressing   Problem: Education: Goal: Knowledge of General Education information will improve Description: Including pain rating scale, medication(s)/side effects and non-pharmacologic comfort measures Outcome: Progressing   Problem: Health Behavior/Discharge Planning: Goal: Ability to manage health-related needs will improve Outcome: Progressing   Problem: Clinical Measurements: Goal: Ability to maintain clinical measurements within normal limits will improve Outcome: Progressing Goal: Will remain free from infection Outcome: Progressing Goal: Diagnostic test results will improve Outcome: Progressing Goal: Respiratory complications will improve Outcome: Progressing Goal: Cardiovascular complication will be avoided Outcome: Progressing   Problem: Activity: Goal:  Risk for activity intolerance will decrease Outcome: Progressing   Problem: Nutrition: Goal: Adequate nutrition will be maintained Outcome: Progressing   Problem: Coping: Goal: Level of anxiety will decrease Outcome: Progressing   Problem: Elimination: Goal: Will not experience complications related to bowel motility Outcome: Progressing Goal: Will not experience complications related to urinary retention Outcome: Progressing   Problem: Pain Managment: Goal: General experience of comfort will improve and/or be controlled Outcome: Progressing   Problem: Safety: Goal: Ability to remain free from injury will improve Outcome: Progressing   Problem: Skin Integrity: Goal: Risk for impaired skin integrity will decrease Outcome: Progressing   Problem: Education: Goal: Ability to demonstrate management of disease process will improve Outcome: Progressing Goal: Ability to verbalize understanding of medication therapies will improve Outcome: Progressing   Problem: Activity: Goal: Capacity to carry out activities will improve Outcome: Progressing   Problem: Cardiac: Goal: Ability to achieve and maintain adequate cardiopulmonary perfusion will improve Outcome: Progressing   Problem: Education: Goal: Knowledge of disease or condition will improve Outcome: Progressing Goal: Understanding of medication regimen will improve Outcome: Progressing   Problem: Activity: Goal: Ability to tolerate increased activity will improve Outcome: Progressing   Problem: Cardiac: Goal: Ability to achieve and maintain adequate cardiopulmonary perfusion will improve Outcome: Progressing   Problem: Health Behavior/Discharge Planning: Goal: Ability to safely manage health-related needs after discharge will improve - Anticipated discharge to SNF for rehab. Outcome: Progressing   Cindy S. Clelia Croft BSN, RN, CCRP, CCRN 04/01/2023 4:07 AM

## 2023-04-05 ENCOUNTER — Encounter (HOSPITAL_BASED_OUTPATIENT_CLINIC_OR_DEPARTMENT_OTHER): Payer: Medicare Other | Attending: General Surgery | Admitting: General Surgery

## 2023-04-05 DIAGNOSIS — E1151 Type 2 diabetes mellitus with diabetic peripheral angiopathy without gangrene: Secondary | ICD-10-CM | POA: Diagnosis not present

## 2023-04-05 DIAGNOSIS — E11621 Type 2 diabetes mellitus with foot ulcer: Secondary | ICD-10-CM | POA: Insufficient documentation

## 2023-04-05 DIAGNOSIS — L97512 Non-pressure chronic ulcer of other part of right foot with fat layer exposed: Secondary | ICD-10-CM | POA: Diagnosis not present

## 2023-04-06 ENCOUNTER — Encounter: Payer: Self-pay | Admitting: Internal Medicine

## 2023-04-06 NOTE — Telephone Encounter (Signed)
 Please to contact family -   Pt needs to go to ED with too low BP (hypotension) asap  thanks

## 2023-04-06 NOTE — Telephone Encounter (Signed)
 Called and spoke with Pt daughter and she states understanding.

## 2023-04-07 ENCOUNTER — Encounter (HOSPITAL_BASED_OUTPATIENT_CLINIC_OR_DEPARTMENT_OTHER): Payer: Medicare Other | Admitting: General Surgery

## 2023-04-07 DIAGNOSIS — E11621 Type 2 diabetes mellitus with foot ulcer: Secondary | ICD-10-CM | POA: Diagnosis not present

## 2023-04-07 DIAGNOSIS — E1151 Type 2 diabetes mellitus with diabetic peripheral angiopathy without gangrene: Secondary | ICD-10-CM | POA: Diagnosis not present

## 2023-04-07 DIAGNOSIS — L97512 Non-pressure chronic ulcer of other part of right foot with fat layer exposed: Secondary | ICD-10-CM | POA: Diagnosis not present

## 2023-04-10 ENCOUNTER — Encounter: Payer: Self-pay | Admitting: Internal Medicine

## 2023-04-10 ENCOUNTER — Ambulatory Visit (INDEPENDENT_AMBULATORY_CARE_PROVIDER_SITE_OTHER): Payer: Medicare Other | Admitting: Internal Medicine

## 2023-04-10 VITALS — BP 120/68 | HR 82 | Temp 98.2°F | Ht 70.0 in | Wt 184.0 lb

## 2023-04-10 DIAGNOSIS — Z8701 Personal history of pneumonia (recurrent): Secondary | ICD-10-CM

## 2023-04-10 DIAGNOSIS — I5022 Chronic systolic (congestive) heart failure: Secondary | ICD-10-CM

## 2023-04-10 DIAGNOSIS — J129 Viral pneumonia, unspecified: Secondary | ICD-10-CM

## 2023-04-10 DIAGNOSIS — R269 Unspecified abnormalities of gait and mobility: Secondary | ICD-10-CM | POA: Diagnosis not present

## 2023-04-10 DIAGNOSIS — J189 Pneumonia, unspecified organism: Secondary | ICD-10-CM

## 2023-04-10 DIAGNOSIS — I10 Essential (primary) hypertension: Secondary | ICD-10-CM | POA: Diagnosis not present

## 2023-04-10 NOTE — Patient Instructions (Signed)
 Please continue all other medications as before, and refills have been done if requested.  Please have the pharmacy call with any other refills you may need.  Please keep your appointments with your specialists as you may have planned - Cardiology Mar 17  You will be contacted regarding the referral for: Home Health with RN, Physical Therapy, and Aide

## 2023-04-10 NOTE — Progress Notes (Signed)
Patient ID: [name], male   DOB: [birth year], 78 y.o.   MRN: [id number]        Chief Complaint: follow up recent hospn Jan 21 - Apr 01 2023 after fall with mild head strike, hypotension, elevated CK and acute hypoxic resp failure, metapneumovirus infection and chf       HPI:  [name] is a 78 y.o. male here with recent hospn here with family overall doing well except for persistent generalized weakness and gait difficulty/   Pt denies chest pain, increased sob or doe, wheezing, orthopnea, PND, increased LE swelling, palpitations, dizziness or syncope.   Pt denies polydipsia, polyuria, or new focal neuro s/s.    Pt denies fever, wt loss, night sweats, loss of appetite, or other constitutional symptoms         Wt Readings from Last 3 Encounters:  04/10/23 184 lb (83.5 kg)  04/01/23 165 lb 2 oz (74.9 kg)  11/21/22 192 lb 3.2 oz (87.2 kg)   BP Readings from Last 3 Encounters:  04/10/23 120/68  04/01/23 (!) 96/53  11/21/22 110/80         Past Medical History:  Diagnosis Date   CALLUS, RIGHT FOOT 12/08/2009   Cardiac arrest (HCC)    Chronic systolic heart failure (HCC) 11/03/2009   DIABETES MELLITUS, TYPE II 12/05/2006   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/2009   HYPERLIPIDEMIA 11/25/2006   HYPOGONADISM, MALE 12/05/2006   Implantable cardiac defibrillator MDT 09/22/2008   Change out feb 2011, 2015   Ischemic cardiomyopathy 09/22/2008   DES CX and RCA  70% residual LAD  Done in [place]; EF 30%;; myoviewq 2006-04-23 no ischemia   Kidney stones    Long term (current) use of anticoagulants 04/25/2010   Myocardial infarction [facility]) 04-23-04   "I died and they brought me back"   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   NEPHROLITHIASIS, HX OF 02/25/2008   Persistent atrial fibrillation (HCC) 11/25/2006   Inapp shock 23-Apr-2010 AF VR >220 recurrent   Past Surgical History:  Procedure Laterality Date   CARDIAC CATHETERIZATION     feburary 23-Apr-2012   CARDIAC DEFIBRILLATOR PLACEMENT     Guidant Vitality  [name]   CARDIOVERSION N/A 04/25/2012   Procedure: CARDIOVERSION;  Surgeon: Cassell Clement, MD;  Location[facility] ENDOSCOPY;  Service: Cardiovascular;  Laterality: N/A;   CARDIOVERSION N/A 05/21/2012   Procedure: CARDIOVERSION;  Surgeon: Duke Salvia, MD;  Location: [facility] ENDOSCOPY;  Service: Cardiovascular;  Laterality: N/A;   CATARACT EXTRACTION     EYE SURGERY     cataracts   IMPLANTABLE CARDIOVERTER DEFIBRILLATOR (ICD) GENERATOR CHANGE N/A 02/05/2014   Procedure: ICD GENERATOR CHANGE;  Surgeon: Duke Salvia, MD;  Location: [facility] CATH LAB;  Service: Cardiovascular;  Laterality: N/A;   PTCA     TONSILLECTOMY     TONSILLECTOMY     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 He has never used smokeless tobacco. He reports current alcohol use. He reports that he does not use drugs. family history includes Breast cancer in his mother; Diabetes in his maternal grandmother; Hyperlipidemia in his maternal grandmother. Allergies  Allergen Reactions   Salmon [Fish Allergy] Other (See Comments)    Only canned/ not fresh (probably preservatives)   Sulfa Antibiotics Other (See Comments)    Not sure he has an allergy to  this (??)   Metformin And Related Diarrhea   Current Outpatient Medications on File Prior to Visit  Medication Sig Dispense Refill   aspirin EC 81 MG tablet Take 81 mg by mouth daily. Swallow whole.     Cholecalciferol (VITAMIN D3 PO) Take 1 capsule by mouth daily.     Continuous Glucose Receiver (FREESTYLE LIBRE 2 READER) DEVI Use as instructure to check blood sugar 1 each 0   Continuous Glucose Sensor (FREESTYLE LIBRE 2 SENSOR) MISC Apply new sensor every 14 days (Patient taking differently: Inject 1 Device into the skin every 14 (fourteen) days.) 6 each 3   Cyanocobalamin (VITAMIN B12 PO) Take 1 tablet by mouth  daily.     dofetilide (TIKOSYN) 250 MCG capsule Take 1 capsule (250 mcg total) by mouth every 12 (twelve) hours.     empagliflozin (JARDIANCE) 25 MG TABS tablet Take 25 mg by mouth daily.     eplerenone (INSPRA) 50 MG tablet Take 50 mg by mouth in the morning.     FLUoxetine (PROZAC) 10 MG capsule Take 1 capsule (10 mg total) by mouth daily. 90 capsule 3   fluticasone (FLONASE) 50 MCG/ACT nasal spray USE 2 SPRAYS IN EACH NOSTRIL EVERY DAY (Patient taking differently: Place 2 sprays into both nostrils daily as needed for allergies or rhinitis.) 16 g 5   Glucose Blood (FREESTYLE PRECISION NEO TEST VI) by In Vitro route.     glucose blood (ONE TOUCH ULTRA TEST) test strip 1 each by Other route 2 (two) times daily. 200 each 2   insulin lispro (HUMALOG KWIKPEN) 100 UNIT/ML KwikPen INJECT 10-14 units under skin before meals, 3 times a day, as advised (Patient taking differently: Inject 11 Units into the skin 3 (three) times daily before meals.) 45 mL 3   Lancets MISC Use as directed up to 4 times per day 100 each 11   LANTUS SOLOSTAR 100 UNIT/ML Solostar Pen INJECT 14 UNITS UNDER THE SKIN DAILY (Patient taking differently: Inject 14 Units into the skin daily before breakfast.) 15 mL 11   liraglutide (VICTOZA) 18 MG/3ML SOPN Inject 1.8 mg into the skin in the morning.     LORazepam (ATIVAN) 0.5 MG tablet Take 1 tablet (0.5 mg total) by mouth 2 (two) times daily as needed for anxiety. 10 tablet 0   Magnesium Oxide (MAG-OX 400 PO) Take 400 mg by mouth 2 (two) times daily.     metoprolol (TOPROL-XL) 200 MG 24 hr tablet Take 200 mg by mouth daily after supper.     Multiple Vitamins-Minerals (CENTRUM SILVER 50+MEN) TABS Take 1 tablet by mouth daily with breakfast.     NITROSTAT 0.4 MG SL tablet Place 1 tablet (0.4 mg total) under the tongue every 5 (five) minutes as needed for chest pain. 25 tablet 5   Pyridoxine HCl (VITAMIN B6 PO) Take 1 tablet by mouth daily.     rosuvastatin (CRESTOR) 20 MG tablet TAKE 1  TABLET BY MOUTH EVERY DAY (Patient taking differently: Take 20 mg by mouth at bedtime.) 90 tablet 0   sacubitril-valsartan (ENTRESTO) 24-26 MG Take 1 tablet by mouth 2 (two) times daily. Please hold for SBP < 100     torsemide (DEMADEX) 10 MG tablet Take 1 tablet (10 mg total) by mouth daily.     ULTICARE MICRO PEN NEEDLES 32G X 4 MM MISC USE DAILY AS DIRECTED 100 each 3   XARELTO 20 MG TABS tablet TAKE 1 TABLET BY MOUTH EVERY DAY (Patient taking differently: Take 20  mg by mouth daily after supper.) 30 tablet 5   zolpidem (AMBIEN) 5 MG tablet TAKE 1 OR 2 TABLETS BY MOUTH NIGHTLY AT BEDTIME AS NEEDED (Patient taking differently: Take 5 mg by mouth at bedtime.) 30 tablet 0   No current facility-administered medications on file prior to visit.        ROS:  All others reviewed and negative.  Objective        PE:  BP 120/68 (BP Location: Right Arm, Patient Position: Sitting, Cuff Size: Normal)   Pulse 82   Temp 98.2 F (36.8 C) (Oral)   Ht 5\' 10"  (1.778 m)   Wt 184 lb (83.5 kg)   SpO2 99%   BMI 26.40 kg/m                 Constitutional: Pt appears in NAD               HENT: Head: NCAT.                Right Ear: External ear normal.                 Left Ear: External ear normal.                Eyes: . Pupils are equal, round, and reactive to light. Conjunctivae and EOM are normal               Nose: without d/c or deformity               Neck: Neck supple. Gross normal ROM               Cardiovascular: Normal rate and regular rhythm.                 Pulmonary/Chest: Effort normal and breath sounds without rales or wheezing.                Abd:  Soft, NT, ND, + BS, no organomegaly               Neurological: Pt is alert. At baseline orientation, motor grossly intact               Skin: Skin is warm. No rashes, no other new lesions, LE edema - none               Psychiatric: Pt behavior is normal without agitation   Micro: none  Cardiac tracings I have personally interpreted today:   none  Pertinent Radiological findings (summarize): none   Lab Results  Component Value Date   WBC 9.7 03/30/2023   HGB 15.4 03/30/2023   HCT 46.6 03/30/2023   PLT 173 03/30/2023   GLUCOSE 165 (H) 03/31/2023   CHOL 122 08/24/2022   TRIG 270.0 (H) 08/24/2022   HDL 35.60 (L) 08/24/2022   LDLDIRECT 63.0 08/24/2022   LDLCALC 60 08/09/2021   ALT 58 (H) 03/30/2023   AST 27 03/30/2023   NA 133 (L) 03/31/2023   K 3.7 03/31/2023   CL 92 (L) 03/31/2023   CREATININE 1.06 03/31/2023   BUN 46 (H) 03/31/2023   CO2 30 03/31/2023   TSH 4.71 08/24/2022   PSA 0.49 08/24/2022   INR 2.0 (H) 03/21/2023   HGBA1C 7.2 (A) 11/21/2022   MICROALBUR <0.7 08/24/2022   Assessment/Plan:  [name] is a 78 y.o. White or Caucasian [1] male with  has a past medical history of CALLUS, RIGHT FOOT (12/08/2009), Cardiac arrest [facility]), Chronic  systolic heart failure (HCC) (03/05/1094), DIABETES MELLITUS, TYPE II (12/05/2006), DIABETIC PERIPHERAL NEUROPATHY (12/08/2009), HYPERLIPIDEMIA (11/25/2006), HYPOGONADISM, MALE (12/05/2006), Implantable cardiac defibrillator MDT (09/22/2008), Ischemic cardiomyopathy (09/22/2008), Kidney stones, Long term (current) use of anticoagulants (04/25/2010), Myocardial infarction (HCC) (2006), Neoplasm of uncertain behavior of skin (06/05/2007), NEPHROLITHIASIS, HX OF (02/25/2008), and Persistent atrial fibrillation (HCC) (11/25/2006).  SYSTOLIC HEART FAILURE, CHRONIC Volume stable, cont currrent med tx  HTN (hypertension) BP Readings from Last 3 Encounters:  04/10/23 120/68  04/01/23 (!) 96/53  11/21/22 110/80   Stable, pt to continue medical treatment toprol xl 200 every day, entresto   Viral pneumonia Clinically resolved,  to f/u any worsening symptoms or concerns  Gait disorder For [facility] with RN, PT and aide  Followup: Return in about 3 months (around 07/08/2023).  Oliver Barre, MD 04/11/2023 9:02 PM [facility]

## 2023-04-11 ENCOUNTER — Encounter (HOSPITAL_BASED_OUTPATIENT_CLINIC_OR_DEPARTMENT_OTHER): Payer: Medicare Other | Admitting: General Surgery

## 2023-04-11 ENCOUNTER — Encounter: Payer: Self-pay | Admitting: Internal Medicine

## 2023-04-11 DIAGNOSIS — E1151 Type 2 diabetes mellitus with diabetic peripheral angiopathy without gangrene: Secondary | ICD-10-CM | POA: Diagnosis not present

## 2023-04-11 DIAGNOSIS — R269 Unspecified abnormalities of gait and mobility: Secondary | ICD-10-CM | POA: Insufficient documentation

## 2023-04-11 DIAGNOSIS — L97512 Non-pressure chronic ulcer of other part of right foot with fat layer exposed: Secondary | ICD-10-CM | POA: Diagnosis not present

## 2023-04-11 DIAGNOSIS — E11621 Type 2 diabetes mellitus with foot ulcer: Secondary | ICD-10-CM | POA: Diagnosis not present

## 2023-04-11 DIAGNOSIS — J129 Viral pneumonia, unspecified: Secondary | ICD-10-CM | POA: Insufficient documentation

## 2023-04-11 NOTE — Assessment & Plan Note (Signed)
For [facility] with RN, PT and aide

## 2023-04-11 NOTE — Assessment & Plan Note (Signed)
BP Readings from Last 3 Encounters:  04/10/23 120/68  04/01/23 (!) 96/53  11/21/22 110/80   Stable, pt to continue medical treatment toprol xl 200 every day, entresto

## 2023-04-11 NOTE — Assessment & Plan Note (Signed)
Volume stable, cont currrent med tx

## 2023-04-11 NOTE — Assessment & Plan Note (Signed)
Clinically resolved,  to f/u any worsening symptoms or concerns

## 2023-04-15 DIAGNOSIS — L84 Corns and callosities: Secondary | ICD-10-CM | POA: Diagnosis not present

## 2023-04-15 DIAGNOSIS — I110 Hypertensive heart disease with heart failure: Secondary | ICD-10-CM | POA: Diagnosis not present

## 2023-04-15 DIAGNOSIS — Z87442 Personal history of urinary calculi: Secondary | ICD-10-CM | POA: Diagnosis not present

## 2023-04-15 DIAGNOSIS — Z955 Presence of coronary angioplasty implant and graft: Secondary | ICD-10-CM | POA: Diagnosis not present

## 2023-04-15 DIAGNOSIS — Z794 Long term (current) use of insulin: Secondary | ICD-10-CM | POA: Diagnosis not present

## 2023-04-15 DIAGNOSIS — Z85810 Personal history of malignant neoplasm of tongue: Secondary | ICD-10-CM | POA: Diagnosis not present

## 2023-04-15 DIAGNOSIS — E291 Testicular hypofunction: Secondary | ICD-10-CM | POA: Diagnosis not present

## 2023-04-15 DIAGNOSIS — E785 Hyperlipidemia, unspecified: Secondary | ICD-10-CM | POA: Diagnosis not present

## 2023-04-15 DIAGNOSIS — Z89431 Acquired absence of right foot: Secondary | ICD-10-CM | POA: Diagnosis not present

## 2023-04-15 DIAGNOSIS — Z7982 Long term (current) use of aspirin: Secondary | ICD-10-CM | POA: Diagnosis not present

## 2023-04-15 DIAGNOSIS — J123 Human metapneumovirus pneumonia: Secondary | ICD-10-CM | POA: Diagnosis not present

## 2023-04-15 DIAGNOSIS — J9601 Acute respiratory failure with hypoxia: Secondary | ICD-10-CM | POA: Diagnosis not present

## 2023-04-15 DIAGNOSIS — I4819 Other persistent atrial fibrillation: Secondary | ICD-10-CM | POA: Diagnosis not present

## 2023-04-15 DIAGNOSIS — I255 Ischemic cardiomyopathy: Secondary | ICD-10-CM | POA: Diagnosis not present

## 2023-04-15 DIAGNOSIS — I5022 Chronic systolic (congestive) heart failure: Secondary | ICD-10-CM | POA: Diagnosis not present

## 2023-04-15 DIAGNOSIS — Z8674 Personal history of sudden cardiac arrest: Secondary | ICD-10-CM | POA: Diagnosis not present

## 2023-04-15 DIAGNOSIS — Z9181 History of falling: Secondary | ICD-10-CM | POA: Diagnosis not present

## 2023-04-15 DIAGNOSIS — Z7984 Long term (current) use of oral hypoglycemic drugs: Secondary | ICD-10-CM | POA: Diagnosis not present

## 2023-04-15 DIAGNOSIS — I959 Hypotension, unspecified: Secondary | ICD-10-CM | POA: Diagnosis not present

## 2023-04-15 DIAGNOSIS — E1142 Type 2 diabetes mellitus with diabetic polyneuropathy: Secondary | ICD-10-CM | POA: Diagnosis not present

## 2023-04-15 DIAGNOSIS — I252 Old myocardial infarction: Secondary | ICD-10-CM | POA: Diagnosis not present

## 2023-04-15 DIAGNOSIS — Z7901 Long term (current) use of anticoagulants: Secondary | ICD-10-CM | POA: Diagnosis not present

## 2023-04-15 DIAGNOSIS — Z8603 Personal history of neoplasm of uncertain behavior: Secondary | ICD-10-CM | POA: Diagnosis not present

## 2023-04-15 DIAGNOSIS — Z79899 Other long term (current) drug therapy: Secondary | ICD-10-CM | POA: Diagnosis not present

## 2023-04-17 ENCOUNTER — Telehealth: Payer: Self-pay | Admitting: Internal Medicine

## 2023-04-17 DIAGNOSIS — E0800 Diabetes mellitus due to underlying condition with hyperosmolarity without nonketotic hyperglycemic-hyperosmolar coma (NKHHC): Secondary | ICD-10-CM

## 2023-04-17 DIAGNOSIS — E1142 Type 2 diabetes mellitus with diabetic polyneuropathy: Secondary | ICD-10-CM | POA: Diagnosis not present

## 2023-04-17 DIAGNOSIS — B351 Tinea unguium: Secondary | ICD-10-CM | POA: Diagnosis not present

## 2023-04-17 DIAGNOSIS — L851 Acquired keratosis [keratoderma] palmaris et plantaris: Secondary | ICD-10-CM | POA: Diagnosis not present

## 2023-04-17 DIAGNOSIS — L97522 Non-pressure chronic ulcer of other part of left foot with fat layer exposed: Secondary | ICD-10-CM | POA: Diagnosis not present

## 2023-04-17 NOTE — Telephone Encounter (Signed)
 Copied from CRM [id number]. Topic: Clinical - Home Health Verbal Orders >> Apr 17, 2023  9:15 AM Pascal Lux wrote: Caller/Agency: [facility]  Callback Number: [phone] - leave a voicemail Service Requested: Skilled Nursing and Home Health Aide Frequency: 1x week for 5 weeks  and 1x week for 4 weeks Any new concerns about the patient? Yes

## 2023-04-17 NOTE — Telephone Encounter (Signed)
 Ok for verbal

## 2023-04-17 NOTE — Telephone Encounter (Signed)
Pt needs to be seen for more refills.

## 2023-04-18 NOTE — Telephone Encounter (Signed)
 Called and left voicemail giving verbals.

## 2023-04-19 DIAGNOSIS — I252 Old myocardial infarction: Secondary | ICD-10-CM | POA: Diagnosis not present

## 2023-04-19 DIAGNOSIS — J9601 Acute respiratory failure with hypoxia: Secondary | ICD-10-CM | POA: Diagnosis not present

## 2023-04-19 DIAGNOSIS — Z8674 Personal history of sudden cardiac arrest: Secondary | ICD-10-CM | POA: Diagnosis not present

## 2023-04-19 DIAGNOSIS — I110 Hypertensive heart disease with heart failure: Secondary | ICD-10-CM | POA: Diagnosis not present

## 2023-04-19 DIAGNOSIS — E785 Hyperlipidemia, unspecified: Secondary | ICD-10-CM | POA: Diagnosis not present

## 2023-04-19 DIAGNOSIS — J123 Human metapneumovirus pneumonia: Secondary | ICD-10-CM | POA: Diagnosis not present

## 2023-04-19 DIAGNOSIS — I5022 Chronic systolic (congestive) heart failure: Secondary | ICD-10-CM | POA: Diagnosis not present

## 2023-04-19 DIAGNOSIS — Z87442 Personal history of urinary calculi: Secondary | ICD-10-CM | POA: Diagnosis not present

## 2023-04-19 DIAGNOSIS — E1142 Type 2 diabetes mellitus with diabetic polyneuropathy: Secondary | ICD-10-CM | POA: Diagnosis not present

## 2023-04-19 DIAGNOSIS — L84 Corns and callosities: Secondary | ICD-10-CM | POA: Diagnosis not present

## 2023-04-19 DIAGNOSIS — I4819 Other persistent atrial fibrillation: Secondary | ICD-10-CM | POA: Diagnosis not present

## 2023-04-19 DIAGNOSIS — Z8603 Personal history of neoplasm of uncertain behavior: Secondary | ICD-10-CM | POA: Diagnosis not present

## 2023-04-19 DIAGNOSIS — E291 Testicular hypofunction: Secondary | ICD-10-CM | POA: Diagnosis not present

## 2023-04-19 DIAGNOSIS — I959 Hypotension, unspecified: Secondary | ICD-10-CM | POA: Diagnosis not present

## 2023-04-19 DIAGNOSIS — I255 Ischemic cardiomyopathy: Secondary | ICD-10-CM | POA: Diagnosis not present

## 2023-04-19 DIAGNOSIS — Z85810 Personal history of malignant neoplasm of tongue: Secondary | ICD-10-CM | POA: Diagnosis not present

## 2023-04-20 ENCOUNTER — Encounter (HOSPITAL_BASED_OUTPATIENT_CLINIC_OR_DEPARTMENT_OTHER): Payer: Medicare Other | Admitting: General Surgery

## 2023-04-20 DIAGNOSIS — E1151 Type 2 diabetes mellitus with diabetic peripheral angiopathy without gangrene: Secondary | ICD-10-CM | POA: Diagnosis not present

## 2023-04-20 DIAGNOSIS — E11621 Type 2 diabetes mellitus with foot ulcer: Secondary | ICD-10-CM | POA: Diagnosis not present

## 2023-04-20 DIAGNOSIS — L97822 Non-pressure chronic ulcer of other part of left lower leg with fat layer exposed: Secondary | ICD-10-CM | POA: Diagnosis not present

## 2023-04-20 DIAGNOSIS — E11622 Type 2 diabetes mellitus with other skin ulcer: Secondary | ICD-10-CM | POA: Diagnosis not present

## 2023-04-20 DIAGNOSIS — L97512 Non-pressure chronic ulcer of other part of right foot with fat layer exposed: Secondary | ICD-10-CM | POA: Diagnosis not present

## 2023-04-20 DIAGNOSIS — L97522 Non-pressure chronic ulcer of other part of left foot with fat layer exposed: Secondary | ICD-10-CM | POA: Diagnosis not present

## 2023-04-21 DIAGNOSIS — I4819 Other persistent atrial fibrillation: Secondary | ICD-10-CM | POA: Diagnosis not present

## 2023-04-21 DIAGNOSIS — I5022 Chronic systolic (congestive) heart failure: Secondary | ICD-10-CM | POA: Diagnosis not present

## 2023-04-21 DIAGNOSIS — J123 Human metapneumovirus pneumonia: Secondary | ICD-10-CM | POA: Diagnosis not present

## 2023-04-21 DIAGNOSIS — E1142 Type 2 diabetes mellitus with diabetic polyneuropathy: Secondary | ICD-10-CM | POA: Diagnosis not present

## 2023-04-21 DIAGNOSIS — I252 Old myocardial infarction: Secondary | ICD-10-CM | POA: Diagnosis not present

## 2023-04-21 DIAGNOSIS — E291 Testicular hypofunction: Secondary | ICD-10-CM | POA: Diagnosis not present

## 2023-04-21 DIAGNOSIS — I959 Hypotension, unspecified: Secondary | ICD-10-CM | POA: Diagnosis not present

## 2023-04-21 DIAGNOSIS — Z87442 Personal history of urinary calculi: Secondary | ICD-10-CM | POA: Diagnosis not present

## 2023-04-21 DIAGNOSIS — L84 Corns and callosities: Secondary | ICD-10-CM | POA: Diagnosis not present

## 2023-04-21 DIAGNOSIS — I110 Hypertensive heart disease with heart failure: Secondary | ICD-10-CM | POA: Diagnosis not present

## 2023-04-21 DIAGNOSIS — J9601 Acute respiratory failure with hypoxia: Secondary | ICD-10-CM | POA: Diagnosis not present

## 2023-04-21 DIAGNOSIS — I255 Ischemic cardiomyopathy: Secondary | ICD-10-CM | POA: Diagnosis not present

## 2023-04-21 DIAGNOSIS — Z8603 Personal history of neoplasm of uncertain behavior: Secondary | ICD-10-CM | POA: Diagnosis not present

## 2023-04-21 DIAGNOSIS — Z8674 Personal history of sudden cardiac arrest: Secondary | ICD-10-CM | POA: Diagnosis not present

## 2023-04-21 DIAGNOSIS — E785 Hyperlipidemia, unspecified: Secondary | ICD-10-CM | POA: Diagnosis not present

## 2023-04-21 DIAGNOSIS — Z85810 Personal history of malignant neoplasm of tongue: Secondary | ICD-10-CM | POA: Diagnosis not present

## 2023-04-24 DIAGNOSIS — Z87442 Personal history of urinary calculi: Secondary | ICD-10-CM | POA: Diagnosis not present

## 2023-04-24 DIAGNOSIS — I110 Hypertensive heart disease with heart failure: Secondary | ICD-10-CM | POA: Diagnosis not present

## 2023-04-24 DIAGNOSIS — Z85810 Personal history of malignant neoplasm of tongue: Secondary | ICD-10-CM | POA: Diagnosis not present

## 2023-04-24 DIAGNOSIS — J9601 Acute respiratory failure with hypoxia: Secondary | ICD-10-CM | POA: Diagnosis not present

## 2023-04-24 DIAGNOSIS — Z8674 Personal history of sudden cardiac arrest: Secondary | ICD-10-CM | POA: Diagnosis not present

## 2023-04-24 DIAGNOSIS — Z8603 Personal history of neoplasm of uncertain behavior: Secondary | ICD-10-CM | POA: Diagnosis not present

## 2023-04-24 DIAGNOSIS — E291 Testicular hypofunction: Secondary | ICD-10-CM | POA: Diagnosis not present

## 2023-04-24 DIAGNOSIS — J123 Human metapneumovirus pneumonia: Secondary | ICD-10-CM | POA: Diagnosis not present

## 2023-04-24 DIAGNOSIS — I959 Hypotension, unspecified: Secondary | ICD-10-CM | POA: Diagnosis not present

## 2023-04-24 DIAGNOSIS — E1142 Type 2 diabetes mellitus with diabetic polyneuropathy: Secondary | ICD-10-CM | POA: Diagnosis not present

## 2023-04-24 DIAGNOSIS — L84 Corns and callosities: Secondary | ICD-10-CM | POA: Diagnosis not present

## 2023-04-24 DIAGNOSIS — I5022 Chronic systolic (congestive) heart failure: Secondary | ICD-10-CM | POA: Diagnosis not present

## 2023-04-24 DIAGNOSIS — E785 Hyperlipidemia, unspecified: Secondary | ICD-10-CM | POA: Diagnosis not present

## 2023-04-24 DIAGNOSIS — I4819 Other persistent atrial fibrillation: Secondary | ICD-10-CM | POA: Diagnosis not present

## 2023-04-24 DIAGNOSIS — I252 Old myocardial infarction: Secondary | ICD-10-CM | POA: Diagnosis not present

## 2023-04-24 DIAGNOSIS — I255 Ischemic cardiomyopathy: Secondary | ICD-10-CM | POA: Diagnosis not present

## 2023-04-25 DIAGNOSIS — I252 Old myocardial infarction: Secondary | ICD-10-CM | POA: Diagnosis not present

## 2023-04-25 DIAGNOSIS — J123 Human metapneumovirus pneumonia: Secondary | ICD-10-CM | POA: Diagnosis not present

## 2023-04-25 DIAGNOSIS — L84 Corns and callosities: Secondary | ICD-10-CM | POA: Diagnosis not present

## 2023-04-25 DIAGNOSIS — Z85810 Personal history of malignant neoplasm of tongue: Secondary | ICD-10-CM | POA: Diagnosis not present

## 2023-04-25 DIAGNOSIS — E1142 Type 2 diabetes mellitus with diabetic polyneuropathy: Secondary | ICD-10-CM | POA: Diagnosis not present

## 2023-04-25 DIAGNOSIS — I255 Ischemic cardiomyopathy: Secondary | ICD-10-CM | POA: Diagnosis not present

## 2023-04-25 DIAGNOSIS — I5022 Chronic systolic (congestive) heart failure: Secondary | ICD-10-CM | POA: Diagnosis not present

## 2023-04-25 DIAGNOSIS — I959 Hypotension, unspecified: Secondary | ICD-10-CM | POA: Diagnosis not present

## 2023-04-25 DIAGNOSIS — E291 Testicular hypofunction: Secondary | ICD-10-CM | POA: Diagnosis not present

## 2023-04-25 DIAGNOSIS — I4819 Other persistent atrial fibrillation: Secondary | ICD-10-CM | POA: Diagnosis not present

## 2023-04-25 DIAGNOSIS — I110 Hypertensive heart disease with heart failure: Secondary | ICD-10-CM | POA: Diagnosis not present

## 2023-04-25 DIAGNOSIS — Z8603 Personal history of neoplasm of uncertain behavior: Secondary | ICD-10-CM | POA: Diagnosis not present

## 2023-04-25 DIAGNOSIS — J9601 Acute respiratory failure with hypoxia: Secondary | ICD-10-CM | POA: Diagnosis not present

## 2023-04-25 DIAGNOSIS — Z8674 Personal history of sudden cardiac arrest: Secondary | ICD-10-CM | POA: Diagnosis not present

## 2023-04-25 DIAGNOSIS — E785 Hyperlipidemia, unspecified: Secondary | ICD-10-CM | POA: Diagnosis not present

## 2023-04-25 DIAGNOSIS — Z87442 Personal history of urinary calculi: Secondary | ICD-10-CM | POA: Diagnosis not present

## 2023-04-26 DIAGNOSIS — E291 Testicular hypofunction: Secondary | ICD-10-CM | POA: Diagnosis not present

## 2023-04-26 DIAGNOSIS — E785 Hyperlipidemia, unspecified: Secondary | ICD-10-CM | POA: Diagnosis not present

## 2023-04-26 DIAGNOSIS — I110 Hypertensive heart disease with heart failure: Secondary | ICD-10-CM | POA: Diagnosis not present

## 2023-04-26 DIAGNOSIS — Z8603 Personal history of neoplasm of uncertain behavior: Secondary | ICD-10-CM | POA: Diagnosis not present

## 2023-04-26 DIAGNOSIS — J9601 Acute respiratory failure with hypoxia: Secondary | ICD-10-CM | POA: Diagnosis not present

## 2023-04-26 DIAGNOSIS — I5022 Chronic systolic (congestive) heart failure: Secondary | ICD-10-CM | POA: Diagnosis not present

## 2023-04-26 DIAGNOSIS — I959 Hypotension, unspecified: Secondary | ICD-10-CM | POA: Diagnosis not present

## 2023-04-26 DIAGNOSIS — Z8674 Personal history of sudden cardiac arrest: Secondary | ICD-10-CM | POA: Diagnosis not present

## 2023-04-26 DIAGNOSIS — Z87442 Personal history of urinary calculi: Secondary | ICD-10-CM | POA: Diagnosis not present

## 2023-04-26 DIAGNOSIS — Z85810 Personal history of malignant neoplasm of tongue: Secondary | ICD-10-CM | POA: Diagnosis not present

## 2023-04-26 DIAGNOSIS — I252 Old myocardial infarction: Secondary | ICD-10-CM | POA: Diagnosis not present

## 2023-04-26 DIAGNOSIS — E1142 Type 2 diabetes mellitus with diabetic polyneuropathy: Secondary | ICD-10-CM | POA: Diagnosis not present

## 2023-04-26 DIAGNOSIS — I4819 Other persistent atrial fibrillation: Secondary | ICD-10-CM | POA: Diagnosis not present

## 2023-04-26 DIAGNOSIS — L84 Corns and callosities: Secondary | ICD-10-CM | POA: Diagnosis not present

## 2023-04-26 DIAGNOSIS — J123 Human metapneumovirus pneumonia: Secondary | ICD-10-CM | POA: Diagnosis not present

## 2023-04-26 DIAGNOSIS — I255 Ischemic cardiomyopathy: Secondary | ICD-10-CM | POA: Diagnosis not present

## 2023-04-27 DIAGNOSIS — E1142 Type 2 diabetes mellitus with diabetic polyneuropathy: Secondary | ICD-10-CM | POA: Diagnosis not present

## 2023-04-27 DIAGNOSIS — Z8674 Personal history of sudden cardiac arrest: Secondary | ICD-10-CM | POA: Diagnosis not present

## 2023-04-27 DIAGNOSIS — I5022 Chronic systolic (congestive) heart failure: Secondary | ICD-10-CM | POA: Diagnosis not present

## 2023-04-27 DIAGNOSIS — Z85810 Personal history of malignant neoplasm of tongue: Secondary | ICD-10-CM | POA: Diagnosis not present

## 2023-04-27 DIAGNOSIS — I252 Old myocardial infarction: Secondary | ICD-10-CM | POA: Diagnosis not present

## 2023-04-27 DIAGNOSIS — L84 Corns and callosities: Secondary | ICD-10-CM | POA: Diagnosis not present

## 2023-04-27 DIAGNOSIS — J123 Human metapneumovirus pneumonia: Secondary | ICD-10-CM | POA: Diagnosis not present

## 2023-04-27 DIAGNOSIS — I255 Ischemic cardiomyopathy: Secondary | ICD-10-CM | POA: Diagnosis not present

## 2023-04-27 DIAGNOSIS — I4819 Other persistent atrial fibrillation: Secondary | ICD-10-CM | POA: Diagnosis not present

## 2023-04-27 DIAGNOSIS — E291 Testicular hypofunction: Secondary | ICD-10-CM | POA: Diagnosis not present

## 2023-04-27 DIAGNOSIS — Z8603 Personal history of neoplasm of uncertain behavior: Secondary | ICD-10-CM | POA: Diagnosis not present

## 2023-04-27 DIAGNOSIS — E785 Hyperlipidemia, unspecified: Secondary | ICD-10-CM | POA: Diagnosis not present

## 2023-04-27 DIAGNOSIS — I110 Hypertensive heart disease with heart failure: Secondary | ICD-10-CM | POA: Diagnosis not present

## 2023-04-27 DIAGNOSIS — I959 Hypotension, unspecified: Secondary | ICD-10-CM | POA: Diagnosis not present

## 2023-04-27 DIAGNOSIS — J9601 Acute respiratory failure with hypoxia: Secondary | ICD-10-CM | POA: Diagnosis not present

## 2023-04-27 DIAGNOSIS — Z87442 Personal history of urinary calculi: Secondary | ICD-10-CM | POA: Diagnosis not present

## 2023-04-27 DIAGNOSIS — I5042 Chronic combined systolic (congestive) and diastolic (congestive) heart failure: Secondary | ICD-10-CM | POA: Diagnosis not present

## 2023-04-28 ENCOUNTER — Encounter (HOSPITAL_BASED_OUTPATIENT_CLINIC_OR_DEPARTMENT_OTHER): Payer: Medicare Other | Admitting: General Surgery

## 2023-04-28 DIAGNOSIS — E1151 Type 2 diabetes mellitus with diabetic peripheral angiopathy without gangrene: Secondary | ICD-10-CM | POA: Diagnosis not present

## 2023-04-28 DIAGNOSIS — L97522 Non-pressure chronic ulcer of other part of left foot with fat layer exposed: Secondary | ICD-10-CM | POA: Diagnosis not present

## 2023-04-28 DIAGNOSIS — E11621 Type 2 diabetes mellitus with foot ulcer: Secondary | ICD-10-CM | POA: Diagnosis not present

## 2023-04-28 DIAGNOSIS — L97512 Non-pressure chronic ulcer of other part of right foot with fat layer exposed: Secondary | ICD-10-CM | POA: Diagnosis not present

## 2023-05-01 DIAGNOSIS — I4819 Other persistent atrial fibrillation: Secondary | ICD-10-CM | POA: Diagnosis not present

## 2023-05-01 DIAGNOSIS — I252 Old myocardial infarction: Secondary | ICD-10-CM | POA: Diagnosis not present

## 2023-05-01 DIAGNOSIS — I959 Hypotension, unspecified: Secondary | ICD-10-CM | POA: Diagnosis not present

## 2023-05-01 DIAGNOSIS — Z85810 Personal history of malignant neoplasm of tongue: Secondary | ICD-10-CM | POA: Diagnosis not present

## 2023-05-01 DIAGNOSIS — E785 Hyperlipidemia, unspecified: Secondary | ICD-10-CM | POA: Diagnosis not present

## 2023-05-01 DIAGNOSIS — Z87442 Personal history of urinary calculi: Secondary | ICD-10-CM | POA: Diagnosis not present

## 2023-05-01 DIAGNOSIS — L84 Corns and callosities: Secondary | ICD-10-CM | POA: Diagnosis not present

## 2023-05-01 DIAGNOSIS — Z8674 Personal history of sudden cardiac arrest: Secondary | ICD-10-CM | POA: Diagnosis not present

## 2023-05-01 DIAGNOSIS — E1142 Type 2 diabetes mellitus with diabetic polyneuropathy: Secondary | ICD-10-CM | POA: Diagnosis not present

## 2023-05-01 DIAGNOSIS — I110 Hypertensive heart disease with heart failure: Secondary | ICD-10-CM | POA: Diagnosis not present

## 2023-05-01 DIAGNOSIS — J9601 Acute respiratory failure with hypoxia: Secondary | ICD-10-CM | POA: Diagnosis not present

## 2023-05-01 DIAGNOSIS — I5022 Chronic systolic (congestive) heart failure: Secondary | ICD-10-CM | POA: Diagnosis not present

## 2023-05-01 DIAGNOSIS — I255 Ischemic cardiomyopathy: Secondary | ICD-10-CM | POA: Diagnosis not present

## 2023-05-01 DIAGNOSIS — J123 Human metapneumovirus pneumonia: Secondary | ICD-10-CM | POA: Diagnosis not present

## 2023-05-01 DIAGNOSIS — Z8603 Personal history of neoplasm of uncertain behavior: Secondary | ICD-10-CM | POA: Diagnosis not present

## 2023-05-01 DIAGNOSIS — E291 Testicular hypofunction: Secondary | ICD-10-CM | POA: Diagnosis not present

## 2023-05-02 DIAGNOSIS — E1142 Type 2 diabetes mellitus with diabetic polyneuropathy: Secondary | ICD-10-CM | POA: Diagnosis not present

## 2023-05-02 DIAGNOSIS — J123 Human metapneumovirus pneumonia: Secondary | ICD-10-CM | POA: Diagnosis not present

## 2023-05-02 DIAGNOSIS — I252 Old myocardial infarction: Secondary | ICD-10-CM | POA: Diagnosis not present

## 2023-05-02 DIAGNOSIS — J9601 Acute respiratory failure with hypoxia: Secondary | ICD-10-CM | POA: Diagnosis not present

## 2023-05-02 DIAGNOSIS — I959 Hypotension, unspecified: Secondary | ICD-10-CM | POA: Diagnosis not present

## 2023-05-02 DIAGNOSIS — I4819 Other persistent atrial fibrillation: Secondary | ICD-10-CM | POA: Diagnosis not present

## 2023-05-02 DIAGNOSIS — I110 Hypertensive heart disease with heart failure: Secondary | ICD-10-CM | POA: Diagnosis not present

## 2023-05-02 DIAGNOSIS — Z85810 Personal history of malignant neoplasm of tongue: Secondary | ICD-10-CM | POA: Diagnosis not present

## 2023-05-02 DIAGNOSIS — I5022 Chronic systolic (congestive) heart failure: Secondary | ICD-10-CM | POA: Diagnosis not present

## 2023-05-02 DIAGNOSIS — L84 Corns and callosities: Secondary | ICD-10-CM | POA: Diagnosis not present

## 2023-05-02 DIAGNOSIS — Z8674 Personal history of sudden cardiac arrest: Secondary | ICD-10-CM | POA: Diagnosis not present

## 2023-05-02 DIAGNOSIS — Z87442 Personal history of urinary calculi: Secondary | ICD-10-CM | POA: Diagnosis not present

## 2023-05-02 DIAGNOSIS — I255 Ischemic cardiomyopathy: Secondary | ICD-10-CM | POA: Diagnosis not present

## 2023-05-02 DIAGNOSIS — E291 Testicular hypofunction: Secondary | ICD-10-CM | POA: Diagnosis not present

## 2023-05-02 DIAGNOSIS — Z8603 Personal history of neoplasm of uncertain behavior: Secondary | ICD-10-CM | POA: Diagnosis not present

## 2023-05-02 DIAGNOSIS — E785 Hyperlipidemia, unspecified: Secondary | ICD-10-CM | POA: Diagnosis not present

## 2023-05-04 ENCOUNTER — Encounter (HOSPITAL_BASED_OUTPATIENT_CLINIC_OR_DEPARTMENT_OTHER): Payer: Medicare Other | Attending: General Surgery | Admitting: Internal Medicine

## 2023-05-04 DIAGNOSIS — E1149 Type 2 diabetes mellitus with other diabetic neurological complication: Secondary | ICD-10-CM | POA: Diagnosis not present

## 2023-05-04 DIAGNOSIS — L97522 Non-pressure chronic ulcer of other part of left foot with fat layer exposed: Secondary | ICD-10-CM | POA: Insufficient documentation

## 2023-05-04 DIAGNOSIS — L84 Corns and callosities: Secondary | ICD-10-CM | POA: Diagnosis not present

## 2023-05-04 DIAGNOSIS — I255 Ischemic cardiomyopathy: Secondary | ICD-10-CM | POA: Diagnosis not present

## 2023-05-04 DIAGNOSIS — E1151 Type 2 diabetes mellitus with diabetic peripheral angiopathy without gangrene: Secondary | ICD-10-CM | POA: Insufficient documentation

## 2023-05-04 DIAGNOSIS — I110 Hypertensive heart disease with heart failure: Secondary | ICD-10-CM | POA: Diagnosis not present

## 2023-05-04 DIAGNOSIS — Z87442 Personal history of urinary calculi: Secondary | ICD-10-CM | POA: Diagnosis not present

## 2023-05-04 DIAGNOSIS — E785 Hyperlipidemia, unspecified: Secondary | ICD-10-CM | POA: Diagnosis not present

## 2023-05-04 DIAGNOSIS — J123 Human metapneumovirus pneumonia: Secondary | ICD-10-CM | POA: Diagnosis not present

## 2023-05-04 DIAGNOSIS — Z8674 Personal history of sudden cardiac arrest: Secondary | ICD-10-CM | POA: Diagnosis not present

## 2023-05-04 DIAGNOSIS — E11622 Type 2 diabetes mellitus with other skin ulcer: Secondary | ICD-10-CM | POA: Diagnosis not present

## 2023-05-04 DIAGNOSIS — Z8603 Personal history of neoplasm of uncertain behavior: Secondary | ICD-10-CM | POA: Diagnosis not present

## 2023-05-04 DIAGNOSIS — Z85810 Personal history of malignant neoplasm of tongue: Secondary | ICD-10-CM | POA: Diagnosis not present

## 2023-05-04 DIAGNOSIS — E11621 Type 2 diabetes mellitus with foot ulcer: Secondary | ICD-10-CM | POA: Diagnosis not present

## 2023-05-04 DIAGNOSIS — I5022 Chronic systolic (congestive) heart failure: Secondary | ICD-10-CM | POA: Diagnosis not present

## 2023-05-04 DIAGNOSIS — E1142 Type 2 diabetes mellitus with diabetic polyneuropathy: Secondary | ICD-10-CM | POA: Diagnosis not present

## 2023-05-04 DIAGNOSIS — E291 Testicular hypofunction: Secondary | ICD-10-CM | POA: Diagnosis not present

## 2023-05-04 DIAGNOSIS — I252 Old myocardial infarction: Secondary | ICD-10-CM | POA: Diagnosis not present

## 2023-05-04 DIAGNOSIS — I4819 Other persistent atrial fibrillation: Secondary | ICD-10-CM | POA: Diagnosis not present

## 2023-05-04 DIAGNOSIS — I959 Hypotension, unspecified: Secondary | ICD-10-CM | POA: Diagnosis not present

## 2023-05-04 DIAGNOSIS — J9601 Acute respiratory failure with hypoxia: Secondary | ICD-10-CM | POA: Diagnosis not present

## 2023-05-05 DIAGNOSIS — Z87442 Personal history of urinary calculi: Secondary | ICD-10-CM | POA: Diagnosis not present

## 2023-05-05 DIAGNOSIS — L84 Corns and callosities: Secondary | ICD-10-CM | POA: Diagnosis not present

## 2023-05-05 DIAGNOSIS — I4819 Other persistent atrial fibrillation: Secondary | ICD-10-CM | POA: Diagnosis not present

## 2023-05-05 DIAGNOSIS — I5022 Chronic systolic (congestive) heart failure: Secondary | ICD-10-CM | POA: Diagnosis not present

## 2023-05-05 DIAGNOSIS — I255 Ischemic cardiomyopathy: Secondary | ICD-10-CM | POA: Diagnosis not present

## 2023-05-05 DIAGNOSIS — Z85810 Personal history of malignant neoplasm of tongue: Secondary | ICD-10-CM | POA: Diagnosis not present

## 2023-05-05 DIAGNOSIS — Z8603 Personal history of neoplasm of uncertain behavior: Secondary | ICD-10-CM | POA: Diagnosis not present

## 2023-05-05 DIAGNOSIS — E291 Testicular hypofunction: Secondary | ICD-10-CM | POA: Diagnosis not present

## 2023-05-05 DIAGNOSIS — E785 Hyperlipidemia, unspecified: Secondary | ICD-10-CM | POA: Diagnosis not present

## 2023-05-05 DIAGNOSIS — J9601 Acute respiratory failure with hypoxia: Secondary | ICD-10-CM | POA: Diagnosis not present

## 2023-05-05 DIAGNOSIS — J123 Human metapneumovirus pneumonia: Secondary | ICD-10-CM | POA: Diagnosis not present

## 2023-05-05 DIAGNOSIS — I110 Hypertensive heart disease with heart failure: Secondary | ICD-10-CM | POA: Diagnosis not present

## 2023-05-05 DIAGNOSIS — E1142 Type 2 diabetes mellitus with diabetic polyneuropathy: Secondary | ICD-10-CM | POA: Diagnosis not present

## 2023-05-05 DIAGNOSIS — I252 Old myocardial infarction: Secondary | ICD-10-CM | POA: Diagnosis not present

## 2023-05-05 DIAGNOSIS — I959 Hypotension, unspecified: Secondary | ICD-10-CM | POA: Diagnosis not present

## 2023-05-05 DIAGNOSIS — Z8674 Personal history of sudden cardiac arrest: Secondary | ICD-10-CM | POA: Diagnosis not present

## 2023-05-09 DIAGNOSIS — E291 Testicular hypofunction: Secondary | ICD-10-CM | POA: Diagnosis not present

## 2023-05-09 DIAGNOSIS — I5022 Chronic systolic (congestive) heart failure: Secondary | ICD-10-CM | POA: Diagnosis not present

## 2023-05-09 DIAGNOSIS — L84 Corns and callosities: Secondary | ICD-10-CM | POA: Diagnosis not present

## 2023-05-09 DIAGNOSIS — Z87442 Personal history of urinary calculi: Secondary | ICD-10-CM | POA: Diagnosis not present

## 2023-05-09 DIAGNOSIS — J123 Human metapneumovirus pneumonia: Secondary | ICD-10-CM | POA: Diagnosis not present

## 2023-05-09 DIAGNOSIS — I959 Hypotension, unspecified: Secondary | ICD-10-CM | POA: Diagnosis not present

## 2023-05-09 DIAGNOSIS — I110 Hypertensive heart disease with heart failure: Secondary | ICD-10-CM | POA: Diagnosis not present

## 2023-05-09 DIAGNOSIS — E785 Hyperlipidemia, unspecified: Secondary | ICD-10-CM | POA: Diagnosis not present

## 2023-05-09 DIAGNOSIS — I255 Ischemic cardiomyopathy: Secondary | ICD-10-CM | POA: Diagnosis not present

## 2023-05-09 DIAGNOSIS — Z85810 Personal history of malignant neoplasm of tongue: Secondary | ICD-10-CM | POA: Diagnosis not present

## 2023-05-09 DIAGNOSIS — I252 Old myocardial infarction: Secondary | ICD-10-CM | POA: Diagnosis not present

## 2023-05-09 DIAGNOSIS — I4819 Other persistent atrial fibrillation: Secondary | ICD-10-CM | POA: Diagnosis not present

## 2023-05-09 DIAGNOSIS — Z8603 Personal history of neoplasm of uncertain behavior: Secondary | ICD-10-CM | POA: Diagnosis not present

## 2023-05-09 DIAGNOSIS — Z8674 Personal history of sudden cardiac arrest: Secondary | ICD-10-CM | POA: Diagnosis not present

## 2023-05-09 DIAGNOSIS — J9601 Acute respiratory failure with hypoxia: Secondary | ICD-10-CM | POA: Diagnosis not present

## 2023-05-09 DIAGNOSIS — E1142 Type 2 diabetes mellitus with diabetic polyneuropathy: Secondary | ICD-10-CM | POA: Diagnosis not present

## 2023-05-11 ENCOUNTER — Encounter (HOSPITAL_BASED_OUTPATIENT_CLINIC_OR_DEPARTMENT_OTHER): Payer: Medicare Other | Admitting: Internal Medicine

## 2023-05-11 DIAGNOSIS — E1149 Type 2 diabetes mellitus with other diabetic neurological complication: Secondary | ICD-10-CM | POA: Diagnosis not present

## 2023-05-11 DIAGNOSIS — E11622 Type 2 diabetes mellitus with other skin ulcer: Secondary | ICD-10-CM | POA: Diagnosis not present

## 2023-05-11 DIAGNOSIS — Z8603 Personal history of neoplasm of uncertain behavior: Secondary | ICD-10-CM | POA: Diagnosis not present

## 2023-05-11 DIAGNOSIS — Z87442 Personal history of urinary calculi: Secondary | ICD-10-CM | POA: Diagnosis not present

## 2023-05-11 DIAGNOSIS — I110 Hypertensive heart disease with heart failure: Secondary | ICD-10-CM | POA: Diagnosis not present

## 2023-05-11 DIAGNOSIS — I5022 Chronic systolic (congestive) heart failure: Secondary | ICD-10-CM | POA: Diagnosis not present

## 2023-05-11 DIAGNOSIS — I959 Hypotension, unspecified: Secondary | ICD-10-CM | POA: Diagnosis not present

## 2023-05-11 DIAGNOSIS — I4819 Other persistent atrial fibrillation: Secondary | ICD-10-CM | POA: Diagnosis not present

## 2023-05-11 DIAGNOSIS — E1142 Type 2 diabetes mellitus with diabetic polyneuropathy: Secondary | ICD-10-CM | POA: Diagnosis not present

## 2023-05-11 DIAGNOSIS — E11621 Type 2 diabetes mellitus with foot ulcer: Secondary | ICD-10-CM | POA: Diagnosis not present

## 2023-05-11 DIAGNOSIS — E291 Testicular hypofunction: Secondary | ICD-10-CM | POA: Diagnosis not present

## 2023-05-11 DIAGNOSIS — Z85810 Personal history of malignant neoplasm of tongue: Secondary | ICD-10-CM | POA: Diagnosis not present

## 2023-05-11 DIAGNOSIS — E785 Hyperlipidemia, unspecified: Secondary | ICD-10-CM | POA: Diagnosis not present

## 2023-05-11 DIAGNOSIS — J9601 Acute respiratory failure with hypoxia: Secondary | ICD-10-CM | POA: Diagnosis not present

## 2023-05-11 DIAGNOSIS — L97522 Non-pressure chronic ulcer of other part of left foot with fat layer exposed: Secondary | ICD-10-CM | POA: Diagnosis not present

## 2023-05-11 DIAGNOSIS — J123 Human metapneumovirus pneumonia: Secondary | ICD-10-CM | POA: Diagnosis not present

## 2023-05-11 DIAGNOSIS — I255 Ischemic cardiomyopathy: Secondary | ICD-10-CM | POA: Diagnosis not present

## 2023-05-11 DIAGNOSIS — L84 Corns and callosities: Secondary | ICD-10-CM | POA: Diagnosis not present

## 2023-05-11 DIAGNOSIS — E1151 Type 2 diabetes mellitus with diabetic peripheral angiopathy without gangrene: Secondary | ICD-10-CM | POA: Diagnosis not present

## 2023-05-11 DIAGNOSIS — Z8674 Personal history of sudden cardiac arrest: Secondary | ICD-10-CM | POA: Diagnosis not present

## 2023-05-11 DIAGNOSIS — I252 Old myocardial infarction: Secondary | ICD-10-CM | POA: Diagnosis not present

## 2023-05-12 DIAGNOSIS — I5022 Chronic systolic (congestive) heart failure: Secondary | ICD-10-CM | POA: Diagnosis not present

## 2023-05-12 DIAGNOSIS — J123 Human metapneumovirus pneumonia: Secondary | ICD-10-CM | POA: Diagnosis not present

## 2023-05-12 DIAGNOSIS — I252 Old myocardial infarction: Secondary | ICD-10-CM | POA: Diagnosis not present

## 2023-05-12 DIAGNOSIS — Z8674 Personal history of sudden cardiac arrest: Secondary | ICD-10-CM | POA: Diagnosis not present

## 2023-05-12 DIAGNOSIS — L84 Corns and callosities: Secondary | ICD-10-CM | POA: Diagnosis not present

## 2023-05-12 DIAGNOSIS — I255 Ischemic cardiomyopathy: Secondary | ICD-10-CM | POA: Diagnosis not present

## 2023-05-12 DIAGNOSIS — I959 Hypotension, unspecified: Secondary | ICD-10-CM | POA: Diagnosis not present

## 2023-05-12 DIAGNOSIS — Z8603 Personal history of neoplasm of uncertain behavior: Secondary | ICD-10-CM | POA: Diagnosis not present

## 2023-05-12 DIAGNOSIS — Z85810 Personal history of malignant neoplasm of tongue: Secondary | ICD-10-CM | POA: Diagnosis not present

## 2023-05-12 DIAGNOSIS — Z87442 Personal history of urinary calculi: Secondary | ICD-10-CM | POA: Diagnosis not present

## 2023-05-12 DIAGNOSIS — E1142 Type 2 diabetes mellitus with diabetic polyneuropathy: Secondary | ICD-10-CM | POA: Diagnosis not present

## 2023-05-12 DIAGNOSIS — I110 Hypertensive heart disease with heart failure: Secondary | ICD-10-CM | POA: Diagnosis not present

## 2023-05-12 DIAGNOSIS — I4819 Other persistent atrial fibrillation: Secondary | ICD-10-CM | POA: Diagnosis not present

## 2023-05-12 DIAGNOSIS — E291 Testicular hypofunction: Secondary | ICD-10-CM | POA: Diagnosis not present

## 2023-05-12 DIAGNOSIS — J9601 Acute respiratory failure with hypoxia: Secondary | ICD-10-CM | POA: Diagnosis not present

## 2023-05-12 DIAGNOSIS — E785 Hyperlipidemia, unspecified: Secondary | ICD-10-CM | POA: Diagnosis not present

## 2023-05-15 DIAGNOSIS — I252 Old myocardial infarction: Secondary | ICD-10-CM | POA: Diagnosis not present

## 2023-05-15 DIAGNOSIS — E785 Hyperlipidemia, unspecified: Secondary | ICD-10-CM | POA: Diagnosis not present

## 2023-05-15 DIAGNOSIS — I5022 Chronic systolic (congestive) heart failure: Secondary | ICD-10-CM | POA: Diagnosis not present

## 2023-05-15 DIAGNOSIS — I959 Hypotension, unspecified: Secondary | ICD-10-CM | POA: Diagnosis not present

## 2023-05-15 DIAGNOSIS — Z85810 Personal history of malignant neoplasm of tongue: Secondary | ICD-10-CM | POA: Diagnosis not present

## 2023-05-15 DIAGNOSIS — Z87442 Personal history of urinary calculi: Secondary | ICD-10-CM | POA: Diagnosis not present

## 2023-05-15 DIAGNOSIS — Z955 Presence of coronary angioplasty implant and graft: Secondary | ICD-10-CM | POA: Diagnosis not present

## 2023-05-15 DIAGNOSIS — Z79899 Other long term (current) drug therapy: Secondary | ICD-10-CM | POA: Diagnosis not present

## 2023-05-15 DIAGNOSIS — L84 Corns and callosities: Secondary | ICD-10-CM | POA: Diagnosis not present

## 2023-05-15 DIAGNOSIS — E1142 Type 2 diabetes mellitus with diabetic polyneuropathy: Secondary | ICD-10-CM | POA: Diagnosis not present

## 2023-05-15 DIAGNOSIS — Z794 Long term (current) use of insulin: Secondary | ICD-10-CM | POA: Diagnosis not present

## 2023-05-15 DIAGNOSIS — I110 Hypertensive heart disease with heart failure: Secondary | ICD-10-CM | POA: Diagnosis not present

## 2023-05-15 DIAGNOSIS — E291 Testicular hypofunction: Secondary | ICD-10-CM | POA: Diagnosis not present

## 2023-05-15 DIAGNOSIS — I255 Ischemic cardiomyopathy: Secondary | ICD-10-CM | POA: Diagnosis not present

## 2023-05-15 DIAGNOSIS — Z9181 History of falling: Secondary | ICD-10-CM | POA: Diagnosis not present

## 2023-05-15 DIAGNOSIS — Z89431 Acquired absence of right foot: Secondary | ICD-10-CM | POA: Diagnosis not present

## 2023-05-15 DIAGNOSIS — Z8674 Personal history of sudden cardiac arrest: Secondary | ICD-10-CM | POA: Diagnosis not present

## 2023-05-15 DIAGNOSIS — I4819 Other persistent atrial fibrillation: Secondary | ICD-10-CM | POA: Diagnosis not present

## 2023-05-15 DIAGNOSIS — J9601 Acute respiratory failure with hypoxia: Secondary | ICD-10-CM | POA: Diagnosis not present

## 2023-05-15 DIAGNOSIS — J123 Human metapneumovirus pneumonia: Secondary | ICD-10-CM | POA: Diagnosis not present

## 2023-05-15 DIAGNOSIS — Z7901 Long term (current) use of anticoagulants: Secondary | ICD-10-CM | POA: Diagnosis not present

## 2023-05-15 DIAGNOSIS — Z7982 Long term (current) use of aspirin: Secondary | ICD-10-CM | POA: Diagnosis not present

## 2023-05-15 DIAGNOSIS — Z8603 Personal history of neoplasm of uncertain behavior: Secondary | ICD-10-CM | POA: Diagnosis not present

## 2023-05-15 DIAGNOSIS — Z7984 Long term (current) use of oral hypoglycemic drugs: Secondary | ICD-10-CM | POA: Diagnosis not present

## 2023-05-18 ENCOUNTER — Encounter (HOSPITAL_BASED_OUTPATIENT_CLINIC_OR_DEPARTMENT_OTHER): Payer: Medicare Other | Admitting: General Surgery

## 2023-05-18 DIAGNOSIS — E1149 Type 2 diabetes mellitus with other diabetic neurological complication: Secondary | ICD-10-CM | POA: Diagnosis not present

## 2023-05-18 DIAGNOSIS — L97522 Non-pressure chronic ulcer of other part of left foot with fat layer exposed: Secondary | ICD-10-CM | POA: Diagnosis not present

## 2023-05-18 DIAGNOSIS — E11621 Type 2 diabetes mellitus with foot ulcer: Secondary | ICD-10-CM | POA: Diagnosis not present

## 2023-05-18 DIAGNOSIS — E1151 Type 2 diabetes mellitus with diabetic peripheral angiopathy without gangrene: Secondary | ICD-10-CM | POA: Diagnosis not present

## 2023-05-18 DIAGNOSIS — E11622 Type 2 diabetes mellitus with other skin ulcer: Secondary | ICD-10-CM | POA: Diagnosis not present

## 2023-05-22 DIAGNOSIS — I255 Ischemic cardiomyopathy: Secondary | ICD-10-CM | POA: Diagnosis not present

## 2023-05-22 DIAGNOSIS — Z8603 Personal history of neoplasm of uncertain behavior: Secondary | ICD-10-CM | POA: Diagnosis not present

## 2023-05-22 DIAGNOSIS — I5022 Chronic systolic (congestive) heart failure: Secondary | ICD-10-CM | POA: Diagnosis not present

## 2023-05-22 DIAGNOSIS — I4819 Other persistent atrial fibrillation: Secondary | ICD-10-CM | POA: Diagnosis not present

## 2023-05-22 DIAGNOSIS — Z87442 Personal history of urinary calculi: Secondary | ICD-10-CM | POA: Diagnosis not present

## 2023-05-22 DIAGNOSIS — J123 Human metapneumovirus pneumonia: Secondary | ICD-10-CM | POA: Diagnosis not present

## 2023-05-22 DIAGNOSIS — I959 Hypotension, unspecified: Secondary | ICD-10-CM | POA: Diagnosis not present

## 2023-05-22 DIAGNOSIS — J9601 Acute respiratory failure with hypoxia: Secondary | ICD-10-CM | POA: Diagnosis not present

## 2023-05-22 DIAGNOSIS — E785 Hyperlipidemia, unspecified: Secondary | ICD-10-CM | POA: Diagnosis not present

## 2023-05-22 DIAGNOSIS — Z8674 Personal history of sudden cardiac arrest: Secondary | ICD-10-CM | POA: Diagnosis not present

## 2023-05-22 DIAGNOSIS — E291 Testicular hypofunction: Secondary | ICD-10-CM | POA: Diagnosis not present

## 2023-05-22 DIAGNOSIS — I252 Old myocardial infarction: Secondary | ICD-10-CM | POA: Diagnosis not present

## 2023-05-22 DIAGNOSIS — I110 Hypertensive heart disease with heart failure: Secondary | ICD-10-CM | POA: Diagnosis not present

## 2023-05-22 DIAGNOSIS — Z85810 Personal history of malignant neoplasm of tongue: Secondary | ICD-10-CM | POA: Diagnosis not present

## 2023-05-22 DIAGNOSIS — E1142 Type 2 diabetes mellitus with diabetic polyneuropathy: Secondary | ICD-10-CM | POA: Diagnosis not present

## 2023-05-22 DIAGNOSIS — L84 Corns and callosities: Secondary | ICD-10-CM | POA: Diagnosis not present

## 2023-05-25 ENCOUNTER — Encounter (HOSPITAL_BASED_OUTPATIENT_CLINIC_OR_DEPARTMENT_OTHER): Payer: Medicare Other | Admitting: General Surgery

## 2023-05-25 DIAGNOSIS — E11621 Type 2 diabetes mellitus with foot ulcer: Secondary | ICD-10-CM | POA: Diagnosis not present

## 2023-05-25 DIAGNOSIS — E11622 Type 2 diabetes mellitus with other skin ulcer: Secondary | ICD-10-CM | POA: Diagnosis not present

## 2023-05-25 DIAGNOSIS — L97522 Non-pressure chronic ulcer of other part of left foot with fat layer exposed: Secondary | ICD-10-CM | POA: Diagnosis not present

## 2023-05-25 DIAGNOSIS — E1151 Type 2 diabetes mellitus with diabetic peripheral angiopathy without gangrene: Secondary | ICD-10-CM | POA: Diagnosis not present

## 2023-05-25 DIAGNOSIS — E1149 Type 2 diabetes mellitus with other diabetic neurological complication: Secondary | ICD-10-CM | POA: Diagnosis not present

## 2023-05-29 DIAGNOSIS — I959 Hypotension, unspecified: Secondary | ICD-10-CM | POA: Diagnosis not present

## 2023-05-29 DIAGNOSIS — I5042 Chronic combined systolic (congestive) and diastolic (congestive) heart failure: Secondary | ICD-10-CM | POA: Diagnosis not present

## 2023-05-29 DIAGNOSIS — I110 Hypertensive heart disease with heart failure: Secondary | ICD-10-CM | POA: Diagnosis not present

## 2023-05-29 DIAGNOSIS — E1142 Type 2 diabetes mellitus with diabetic polyneuropathy: Secondary | ICD-10-CM | POA: Diagnosis not present

## 2023-05-29 DIAGNOSIS — I5022 Chronic systolic (congestive) heart failure: Secondary | ICD-10-CM | POA: Diagnosis not present

## 2023-05-29 DIAGNOSIS — Z85810 Personal history of malignant neoplasm of tongue: Secondary | ICD-10-CM | POA: Diagnosis not present

## 2023-05-29 DIAGNOSIS — L84 Corns and callosities: Secondary | ICD-10-CM | POA: Diagnosis not present

## 2023-05-29 DIAGNOSIS — Z4502 Encounter for adjustment and management of automatic implantable cardiac defibrillator: Secondary | ICD-10-CM | POA: Diagnosis not present

## 2023-05-29 DIAGNOSIS — I252 Old myocardial infarction: Secondary | ICD-10-CM | POA: Diagnosis not present

## 2023-05-29 DIAGNOSIS — I255 Ischemic cardiomyopathy: Secondary | ICD-10-CM | POA: Diagnosis not present

## 2023-05-29 DIAGNOSIS — E785 Hyperlipidemia, unspecified: Secondary | ICD-10-CM | POA: Diagnosis not present

## 2023-05-29 DIAGNOSIS — J123 Human metapneumovirus pneumonia: Secondary | ICD-10-CM | POA: Diagnosis not present

## 2023-05-29 DIAGNOSIS — Z8674 Personal history of sudden cardiac arrest: Secondary | ICD-10-CM | POA: Diagnosis not present

## 2023-05-29 DIAGNOSIS — J9601 Acute respiratory failure with hypoxia: Secondary | ICD-10-CM | POA: Diagnosis not present

## 2023-05-29 DIAGNOSIS — Z87442 Personal history of urinary calculi: Secondary | ICD-10-CM | POA: Diagnosis not present

## 2023-05-29 DIAGNOSIS — Z8603 Personal history of neoplasm of uncertain behavior: Secondary | ICD-10-CM | POA: Diagnosis not present

## 2023-05-29 DIAGNOSIS — E291 Testicular hypofunction: Secondary | ICD-10-CM | POA: Diagnosis not present

## 2023-05-29 DIAGNOSIS — I4819 Other persistent atrial fibrillation: Secondary | ICD-10-CM | POA: Diagnosis not present

## 2023-06-01 ENCOUNTER — Ambulatory Visit (HOSPITAL_BASED_OUTPATIENT_CLINIC_OR_DEPARTMENT_OTHER): Admitting: General Surgery

## 2023-06-01 ENCOUNTER — Ambulatory Visit: Admitting: Internal Medicine

## 2023-06-01 ENCOUNTER — Encounter: Payer: Self-pay | Admitting: Internal Medicine

## 2023-06-01 VITALS — BP 120/70 | HR 78 | Ht 70.0 in | Wt 187.0 lb

## 2023-06-01 DIAGNOSIS — E785 Hyperlipidemia, unspecified: Secondary | ICD-10-CM

## 2023-06-01 DIAGNOSIS — E0800 Diabetes mellitus due to underlying condition with hyperosmolarity without nonketotic hyperglycemic-hyperosmolar coma (NKHHC): Secondary | ICD-10-CM | POA: Diagnosis not present

## 2023-06-01 LAB — POCT GLYCOSYLATED HEMOGLOBIN (HGB A1C): Hemoglobin A1C: 6.3 % — AB (ref 4.0–5.6)

## 2023-06-01 NOTE — Patient Instructions (Addendum)
 Please continue: - Victoza 1.8 mg before breakfast - Lantus 14 units in am - Humalog (15 min before meals): 10-12 units before meals If you take Humalog after a meal, may need to reduce the dose to 5-6 units.  Please return in 4 months.

## 2023-06-01 NOTE — Progress Notes (Signed)
 Patient ID: [name], male   DOB: Apr 12, 1945, 78 y.o.   MRN: [id number]  HPI: [name] is a 78 y.o.-year-old male, returning for follow-up for DM2, dx in Jun 09, 2006, insulin-dependent since 06/08/16, uncontrolled, with complications (CAD, CHF, CKD, PN, s/p trans metatarsal amputation). Pt. previously saw Dr. Everardo All, but last visit with me visit 7 months ago.  He is here with his wife who also has had diabetes for many years and is on an insulin pump.  Interim history: No increased urination, nausea, chest pain.  He was admitted in 03/2023 for wound infection and increased CK.  He sees wound care (Dr. Lady Gary).  Reviewed HbA1c: Lab Results  Component Value Date   HGBA1C 7.2 (A) 11/21/2022   HGBA1C 7.3 (H) 08/24/2022   HGBA1C 6.9 (A) 07/21/2022   HGBA1C 6.6 (A) 04/01/2022   HGBA1C 6.7 (A) 12/17/2021   HGBA1C 7.6 (H) 08/09/2021   HGBA1C 7.1 (A) 06/15/2021   HGBA1C 6.9 (A) 03/09/2021   HGBA1C 6.3 (A) 04/01/2020   HGBA1C 6.9 (H) 02/11/2020   Pt is on a regimen of: - Lantus 14 units in am - Humalog 01-08-09 units before or after meals >> (10)-(10)-(5-9) >> 10 units 15 min before the 3 meals >> I recommended 10-12 units before each meal, but actually using 11-11-9 - Victoza 1.8 mg at night  >> in am (and also at night sometimes) Metformin caused nausea. He declines a V-Go device due to cost.  Pt checks his sugars >4x a day - Libre CGM (receiver):  Prev.:  Previously:  Lowest sugar was 40s >> .Marland Kitchen.60s >> 50s >> 60s; he has hypoglycemia awareness at 70.  Highest sugar was 200 x2 >> upper 200s >> 300s.  Glucometer:?  - + CKD, last BUN/creatinine:   Lab Results  Component Value Date   BUN 46 (H) 03/31/2023   BUN 50 (H) 03/30/2023   CREATININE 1.06 03/31/2023   CREATININE 0.91 03/30/2023  07/20/2022: 14/0.94, GFR 85 Lab Results  Component Value Date   MICRALBCREAT 1.2 08/24/2022   MICRALBCREAT 3.9 08/09/2021   MICRALBCREAT 0.7 10/17/2017   MICRALBCREAT 2.5 10/12/2016    MICRALBCREAT 3.2 02/04/2014  He is not on ACE inhibitor or ARB.  -+ HL; last set of lipids: Lab Results  Component Value Date   CHOL 122 08/24/2022   HDL 35.60 (L) 08/24/2022   LDLCALC 60 08/09/2021   LDLDIRECT 63.0 08/24/2022   TRIG 270.0 (H) 08/24/2022   CHOLHDL 3 08/24/2022  On Crestor 20 mg daily.  - last eye exam was 11/2022. No DR reportedly.   - no numbness and tingling in his feet.  Last foot exam 08/24/2022.  He has a history of nephrolithiasis, hypogonadism. He is in a clinical trial  - a supplement that helps with heart condition >> improves stamina and SOB.  ROS: + see HPI  Past Medical History:  Diagnosis Date   CALLUS, RIGHT FOOT 12/08/2009   Cardiac arrest [facility])    Chronic systolic heart failure (HCC) 11/03/2009   DIABETES MELLITUS, TYPE II 12/05/2006   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/2009   HYPERLIPIDEMIA 11/25/2006   HYPOGONADISM, MALE 12/05/2006   Implantable cardiac defibrillator MDT 09/22/2008   Change out feb 2011, 2015   Ischemic cardiomyopathy 09/22/2008   DES CX and RCA  70% residual LAD  Done in [place]; EF 30%;; myoviewq 2006/06/09 no ischemia   Kidney stones    Long term (current) use of anticoagulants 04/25/2010   Myocardial infarction [facility]) June 08, 2004   "I died  and they brought me back"   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   NEPHROLITHIASIS, HX OF 02/25/2008   Persistent atrial fibrillation (HCC) 11/25/2006   Inapp shock 2012 AF VR >220 recurrent   Past Surgical History:  Procedure Laterality Date   CARDIAC CATHETERIZATION     feburary 2014   CARDIAC DEFIBRILLATOR PLACEMENT     Guidant Vitality [name]   CARDIOVERSION N/A 04/25/2012   Procedure: CARDIOVERSION;  Surgeon: Cassell Clement, MD;  Location: [facility] ENDOSCOPY;  Service: Cardiovascular;  Laterality: N/A;   CARDIOVERSION N/A 05/21/2012   Procedure: CARDIOVERSION;  Surgeon: Duke Salvia, MD;  Location: [facility] ENDOSCOPY;  Service: Cardiovascular;  Laterality: N/A;   CATARACT EXTRACTION     EYE  SURGERY     cataracts   IMPLANTABLE CARDIOVERTER DEFIBRILLATOR (ICD) GENERATOR CHANGE N/A 02/05/2014   Procedure: ICD GENERATOR CHANGE;  Surgeon: Duke Salvia, MD;  Location: [facility] CATH LAB;  Service: Cardiovascular;  Laterality: N/A;   PTCA     TONSILLECTOMY     TONSILLECTOMY     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   Procedure: TRANSMETATARSAL AMPUTATION RIGHT;  Surgeon: Cephus Shelling, MD;  Location: [facility] OR;  Service: Vascular;  Laterality: Right;   Social History   Socioeconomic History   Marital status: Divorced    Spouse name: Not on file   Number of children: 1   Years of education: 16   Highest education level: Not on file  Occupational History   Occupation: Musician business and show production    Employer: [employer]  Tobacco Use   Smoking status: Never   Smokeless tobacco: Never  Vaping Use   Vaping status: Never Used  Substance and Sexual Activity   Alcohol use: Yes    Alcohol/week: 0.0 standard drinks of alcohol    Comment: summer    Drug use: No   Sexual activity: Yes    Partners: Female  Other Topics Concern   Not on file  Social History Narrative   HSG, College grad. Married - divorced. 1 daughter. owner/operator Optician, dispensing business and show production   Social Drivers of Health   Financial Resource Strain: Low Risk  (03/18/2022)   Overall Financial Resource Strain (CARDIA)    Difficulty of Paying Living Expenses: Not hard at all  Food Insecurity: No Food Insecurity (03/21/2023)   Hunger Vital Sign    Worried About Running Out of Food in the Last Year: Never true    Ran Out of Food in the Last Year: Never true  Transportation Needs: No Transportation Needs (03/21/2023)   PRAPARE - Administrator, Civil Service (Medical): No    Lack of Transportation (Non-Medical): No  Physical Activity: Inactive (03/18/2022)   Exercise Vital Sign    Days of Exercise per Week: 0 days    Minutes of Exercise per Session: 0  min  Stress: No Stress Concern Present (03/18/2022)   [employer] of Occupational Health - Occupational Stress Questionnaire    Feeling of Stress : Not at all  Social Connections: Socially Isolated (03/21/2023)   Social Connection and Isolation Panel [NHANES]    Frequency of Communication with Friends and Family: More than three times a week    Frequency of Social Gatherings with Friends and Family: Twice a week    Attends Religious Services: Never    Database administrator or Organizations: No    Attends Banker Meetings: Never    Marital Status: Divorced  Intimate Partner Violence: Not At Risk (03/21/2023)   Humiliation, Afraid, Rape, and Kick questionnaire    Fear of Current or Ex-Partner: No    Emotionally Abused: No    Physically Abused: No    Sexually Abused: No   Current Outpatient Medications on File Prior to Visit  Medication Sig Dispense Refill   aspirin EC 81 MG tablet Take 81 mg by mouth daily. Swallow whole.     Cholecalciferol (VITAMIN D3 PO) Take 1 capsule by mouth daily.     Continuous Glucose Receiver (FREESTYLE LIBRE 2 READER) DEVI Use as instructure to check blood sugar 1 each 0   Continuous Glucose Sensor (FREESTYLE LIBRE 2 SENSOR) MISC Apply new sensor every 14 days (Patient taking differently: Inject 1 Device into the skin every 14 (fourteen) days.) 6 each 3   Cyanocobalamin (VITAMIN B12 PO) Take 1 tablet by mouth daily.     dofetilide (TIKOSYN) 250 MCG capsule Take 1 capsule (250 mcg total) by mouth every 12 (twelve) hours.     empagliflozin (JARDIANCE) 25 MG TABS tablet Take 25 mg by mouth daily.     eplerenone (INSPRA) 50 MG tablet Take 50 mg by mouth in the morning.     FLUoxetine (PROZAC) 10 MG capsule Take 1 capsule (10 mg total) by mouth daily. 90 capsule 3   fluticasone (FLONASE) 50 MCG/ACT nasal spray USE 2 SPRAYS IN EACH NOSTRIL EVERY DAY (Patient taking differently: Place 2 sprays into both nostrils daily as needed for allergies or  rhinitis.) 16 g 5   Glucose Blood (FREESTYLE PRECISION NEO TEST VI) by In Vitro route.     glucose blood (ONE TOUCH ULTRA TEST) test strip 1 each by Other route 2 (two) times daily. 200 each 2   insulin lispro (HUMALOG KWIKPEN) 100 UNIT/ML KwikPen INJECT 10-14 units under skin before meals, 3 times a day, as advised 15 mL 1   Lancets MISC Use as directed up to 4 times per day 100 each 11   LANTUS SOLOSTAR 100 UNIT/ML Solostar Pen INJECT 14 UNITS UNDER THE SKIN DAILY (Patient taking differently: Inject 14 Units into the skin daily before breakfast.) 15 mL 11   liraglutide (VICTOZA) 18 MG/3ML SOPN Inject 1.8 mg into the skin in the morning.     LORazepam (ATIVAN) 0.5 MG tablet Take 1 tablet (0.5 mg total) by mouth 2 (two) times daily as needed for anxiety. 10 tablet 0   Magnesium Oxide (MAG-OX 400 PO) Take 400 mg by mouth 2 (two) times daily.     metoprolol (TOPROL-XL) 200 MG 24 hr tablet Take 200 mg by mouth daily after supper.     Multiple Vitamins-Minerals (CENTRUM SILVER 50+MEN) TABS Take 1 tablet by mouth daily with breakfast.     NITROSTAT 0.4 MG SL tablet Place 1 tablet (0.4 mg total) under the tongue every 5 (five) minutes as needed for chest pain. 25 tablet 5   Pyridoxine HCl (VITAMIN B6 PO) Take 1 tablet by mouth daily.     rosuvastatin (CRESTOR) 20 MG tablet TAKE 1 TABLET BY MOUTH EVERY DAY (Patient taking differently: Take 20 mg by mouth at bedtime.) 90 tablet 0   sacubitril-valsartan (ENTRESTO) 24-26 MG Take 1 tablet by mouth 2 (two) times daily. Please hold for SBP < 100     torsemide (DEMADEX) 10 MG tablet Take 1 tablet (10 mg total) by mouth daily.     ULTICARE MICRO PEN NEEDLES 32G X 4 MM MISC USE DAILY AS DIRECTED 100 each 3  XARELTO 20 MG TABS tablet TAKE 1 TABLET BY MOUTH EVERY DAY (Patient taking differently: Take 20 mg by mouth daily after supper.) 30 tablet 5   zolpidem (AMBIEN) 5 MG tablet TAKE 1 OR 2 TABLETS BY MOUTH NIGHTLY AT BEDTIME AS NEEDED (Patient taking differently:  Take 5 mg by mouth at bedtime.) 30 tablet 0   No current facility-administered medications on file prior to visit.   Allergies  Allergen Reactions   Salmon [Fish Allergy] Other (See Comments)    Only canned/ not fresh (probably preservatives)   Sulfa Antibiotics Other (See Comments)    Not sure he has an allergy to this (??)   Metformin And Related Diarrhea   Family History  Problem Relation Age of Onset   Diabetes Maternal Grandmother    Hyperlipidemia Maternal Grandmother    Breast cancer Mother    Cancer Neg Hx    COPD Neg Hx    Heart disease Neg Hx    PE: There were no vitals taken for this visit. Wt Readings from Last 3 Encounters:  04/10/23 184 lb (83.5 kg)  04/01/23 165 lb 2 oz (74.9 kg)  11/21/22 192 lb 3.2 oz (87.2 kg)   Constitutional: overweight, in NAD Eyes: no exophthalmos ENT: no thyromegaly, no cervical lymphadenopathy Cardiovascular: RRR, No MRG Respiratory: CTA B Musculoskeletal: + deformities: Transmetatarsal amputation Skin: no rashes Neurological: no tremor with outstretched hands  ASSESSMENT: 1. DM2, insulin-dependent, uncontrolled, with complications - CAD, s/p AMI - ICM, with CHF, s/p ICD - Persistent A-fib - CKD - PN - Transmetatarsal amputation  2. HL  PLAN:  1. Patient with longstanding, uncontrolled, type 2 diabetes, on injectable antidiabetic regimen with daily GLP-1 receptor agonist and also basal/bolus insulin regimen.  At last visit, sugars were mostly fluctuating within the target range but increasing after the first meal of the day and remaining slightly higher until after dinner, when they were dropping to the normal range.  He was occasionally forgetting the insulin before a meal and taking it afterwards.  I advised him to cut the dose in half if he has to take it after the meal.  He was taking fixed doses of Humalog and we discussed about varying the doses based on the size and consistency of his meals.  His HbA1c at that time was  slightly lower, at 7.2%, decreased from 7.3%. CGM interpretation: -At today's visit, we reviewed his CGM downloads: It appears that 81% of values are in target range (goal >70%), while 90% are higher than 180 (goal <25%), and 0% are lower than 70 (goal <4%).  The calculated average blood sugar is 148.  The projected HbA1c for the next 3 months (GMI) is 6.9%. -Reviewing the CGM trends, sugars appear to be improved, mostly fluctuating within the target range with occasional higher blood sugars after meals and also rare instances of borderline or slightly low blood sugars, especially after breakfast.  Upon questioning, he is still not varying the doses of Humalog insulin as recommended at the previous visits, as he mentions that he forgets to do so.  I again discussed with him and his wife how he should vary the doses.  She is wondering if he could use an insulin to carb ratio but I recommended against this due to the complexities of counting the carbs.  I did not give him a sliding scale for the same reason.  He mentions that he still sometimes taking insulin after the meals and I again advised him to take only  half of the dose if he has to do so.  He forgot about this.  I believe that this is the reason why some sugars are low after meals.  He is taking Victoza either in the morning or at night and I advised him to take it every day before breakfast.  Otherwise, especially due to the improvement in his HbA1c and the blood sugars in general, I did not suggest changes in his doses. - I suggested to:  Patient Instructions  Please continue: - Victoza 1.8 mg before breakfast - Lantus 14 units in am - Humalog (15 min before meals): 10-12 units before meals If you take Humalog after a meal, may need to reduce the dose to 5-6 units.  Please return in 4 months.  - we checked his HbA1c: 6.3% (improved) - advised to check sugars at different times of the day - 4x a day, rotating check times - advised for yearly  eye exams >> he is UTD - he is planning to establish care with podiatry here in town as they now need to drive to [place] for podiatrist appointments. - has appointment coming up with PCP for APE next month - return to clinic in 4 months  2. HL -Latest lipid panel was reviewed from 07/2022: LDL only slightly above target of less than 55, HDL low, triglycerides high: Lab Results  Component Value Date   CHOL 122 08/24/2022   HDL 35.60 (L) 08/24/2022   LDLCALC 60 08/09/2021   LDLDIRECT 63.0 08/24/2022   TRIG 270.0 (H) 08/24/2022   CHOLHDL 3 08/24/2022  -He is on Crestor 20 mg daily without side effects.  Carlus Pavlov, MD PhD [facility]

## 2023-06-05 DIAGNOSIS — I110 Hypertensive heart disease with heart failure: Secondary | ICD-10-CM | POA: Diagnosis not present

## 2023-06-05 DIAGNOSIS — E1142 Type 2 diabetes mellitus with diabetic polyneuropathy: Secondary | ICD-10-CM | POA: Diagnosis not present

## 2023-06-05 DIAGNOSIS — L84 Corns and callosities: Secondary | ICD-10-CM | POA: Diagnosis not present

## 2023-06-05 DIAGNOSIS — Z8674 Personal history of sudden cardiac arrest: Secondary | ICD-10-CM | POA: Diagnosis not present

## 2023-06-05 DIAGNOSIS — E785 Hyperlipidemia, unspecified: Secondary | ICD-10-CM | POA: Diagnosis not present

## 2023-06-05 DIAGNOSIS — E291 Testicular hypofunction: Secondary | ICD-10-CM | POA: Diagnosis not present

## 2023-06-05 DIAGNOSIS — I252 Old myocardial infarction: Secondary | ICD-10-CM | POA: Diagnosis not present

## 2023-06-05 DIAGNOSIS — Z8603 Personal history of neoplasm of uncertain behavior: Secondary | ICD-10-CM | POA: Diagnosis not present

## 2023-06-05 DIAGNOSIS — I4819 Other persistent atrial fibrillation: Secondary | ICD-10-CM | POA: Diagnosis not present

## 2023-06-05 DIAGNOSIS — J9601 Acute respiratory failure with hypoxia: Secondary | ICD-10-CM | POA: Diagnosis not present

## 2023-06-05 DIAGNOSIS — I5022 Chronic systolic (congestive) heart failure: Secondary | ICD-10-CM | POA: Diagnosis not present

## 2023-06-05 DIAGNOSIS — Z87442 Personal history of urinary calculi: Secondary | ICD-10-CM | POA: Diagnosis not present

## 2023-06-05 DIAGNOSIS — J123 Human metapneumovirus pneumonia: Secondary | ICD-10-CM | POA: Diagnosis not present

## 2023-06-05 DIAGNOSIS — I959 Hypotension, unspecified: Secondary | ICD-10-CM | POA: Diagnosis not present

## 2023-06-05 DIAGNOSIS — I255 Ischemic cardiomyopathy: Secondary | ICD-10-CM | POA: Diagnosis not present

## 2023-06-05 DIAGNOSIS — Z85810 Personal history of malignant neoplasm of tongue: Secondary | ICD-10-CM | POA: Diagnosis not present

## 2023-06-08 DIAGNOSIS — Z4502 Encounter for adjustment and management of automatic implantable cardiac defibrillator: Secondary | ICD-10-CM | POA: Diagnosis not present

## 2023-06-08 DIAGNOSIS — Z95810 Presence of automatic (implantable) cardiac defibrillator: Secondary | ICD-10-CM | POA: Diagnosis not present

## 2023-06-08 DIAGNOSIS — I5042 Chronic combined systolic (congestive) and diastolic (congestive) heart failure: Secondary | ICD-10-CM | POA: Diagnosis not present

## 2023-06-13 ENCOUNTER — Ambulatory Visit: Payer: Medicare Other | Admitting: Internal Medicine

## 2023-06-13 DIAGNOSIS — K080 Exfoliation of teeth due to systemic causes: Secondary | ICD-10-CM | POA: Diagnosis not present

## 2023-06-14 ENCOUNTER — Ambulatory Visit

## 2023-06-14 VITALS — BP 108/60 | HR 60 | Ht 70.5 in | Wt 189.4 lb

## 2023-06-14 DIAGNOSIS — Z0000 Encounter for general adult medical examination without abnormal findings: Secondary | ICD-10-CM | POA: Diagnosis not present

## 2023-06-14 NOTE — Progress Notes (Signed)
 Subjective:   [name] is a 78 y.o. who presents for a Medicare Wellness preventive visit.  Visit Complete: In person  Persons Participating in Visit: Patient.  AWV Questionnaire: No: Patient Medicare AWV questionnaire was not completed prior to this visit.  Cardiac Risk Factors include: advanced age (>29men, >31 women);hypertension;dyslipidemia;diabetes mellitus;male gender     Objective:    Today's Vitals   06/14/23 1544  BP: 108/60  Pulse: 60  SpO2: 98%  Weight: 189 lb 6.4 oz (85.9 kg)  Height: 5' 10.5" (1.791 m)   Body mass index is 26.79 kg/m.     06/14/2023    3:42 PM 03/21/2023   12:56 PM 03/18/2022    3:06 PM 02/12/2020    7:00 AM 03/18/2017   10:26 PM 12/24/2015    1:07 AM 02/05/2014    6:39 AM  Advanced Directives  Does Patient Have a Medical Advance Directive? No No Yes Yes Yes No Yes  Type of Surveyor, minerals;Living will Healthcare Power of [employer] of [place];Living will  Living will  Does patient want to make changes to medical advance directive?    No - Guardian declined     Copy of Healthcare Power of Attorney in Chart?   No - copy requested No - copy requested   No - copy requested  Would patient like information on creating a medical advance directive? Yes (MAU/Ambulatory/Procedural Areas - Information given) No - Patient declined         Current Medications (verified) Outpatient Encounter Medications as of 06/14/2023  Medication Sig   aspirin EC 81 MG tablet Take 81 mg by mouth daily. Swallow whole.   Cholecalciferol (VITAMIN D3 PO) Take 1 capsule by mouth daily.   Continuous Glucose Receiver (FREESTYLE LIBRE 2 READER) DEVI Use as instructure to check blood sugar   Continuous Glucose Sensor (FREESTYLE LIBRE 2 SENSOR) MISC Apply new sensor every 14 days (Patient taking differently: Inject 1 Device into the skin every 14 (fourteen) days.)   Cyanocobalamin (VITAMIN B12 PO) Take 1 tablet by mouth  daily.   dofetilide (TIKOSYN) 250 MCG capsule Take 1 capsule (250 mcg total) by mouth every 12 (twelve) hours.   empagliflozin (JARDIANCE) 25 MG TABS tablet Take 25 mg by mouth daily.   eplerenone (INSPRA) 50 MG tablet Take 50 mg by mouth in the morning.   FLUoxetine (PROZAC) 10 MG capsule Take 1 capsule (10 mg total) by mouth daily.   fluticasone (FLONASE) 50 MCG/ACT nasal spray USE 2 SPRAYS IN EACH NOSTRIL EVERY DAY (Patient taking differently: Place 2 sprays into both nostrils daily as needed for allergies or rhinitis.)   Glucose Blood (FREESTYLE PRECISION NEO TEST VI) by In Vitro route.   glucose blood (ONE TOUCH ULTRA TEST) test strip 1 each by Other route 2 (two) times daily.   insulin lispro (HUMALOG KWIKPEN) 100 UNIT/ML KwikPen INJECT 10-14 units under skin before meals, 3 times a day, as advised   Lancets MISC Use as directed up to 4 times per day   LANTUS SOLOSTAR 100 UNIT/ML Solostar Pen INJECT 14 UNITS UNDER THE SKIN DAILY (Patient taking differently: Inject 14 Units into the skin daily before breakfast.)   liraglutide (VICTOZA) 18 MG/3ML SOPN Inject 1.8 mg into the skin in the morning.   LORazepam (ATIVAN) 0.5 MG tablet Take 1 tablet (0.5 mg total) by mouth 2 (two) times daily as needed for anxiety.   Magnesium Oxide (MAG-OX 400 PO) Take 400 mg  by mouth 2 (two) times daily.   metoprolol (TOPROL-XL) 200 MG 24 hr tablet Take 200 mg by mouth daily after supper.   Multiple Vitamins-Minerals (CENTRUM SILVER 50+MEN) TABS Take 1 tablet by mouth daily with breakfast.   NITROSTAT 0.4 MG SL tablet Place 1 tablet (0.4 mg total) under the tongue every 5 (five) minutes as needed for chest pain.   Pyridoxine HCl (VITAMIN B6 PO) Take 1 tablet by mouth daily.   rosuvastatin (CRESTOR) 20 MG tablet TAKE 1 TABLET BY MOUTH EVERY DAY (Patient taking differently: Take 20 mg by mouth at bedtime.)   sacubitril-valsartan (ENTRESTO) 24-26 MG Take 1 tablet by mouth 2 (two) times daily. Please hold for SBP < 100    torsemide (DEMADEX) 10 MG tablet Take 1 tablet (10 mg total) by mouth daily.   ULTICARE MICRO PEN NEEDLES 32G X 4 MM MISC USE DAILY AS DIRECTED   XARELTO 20 MG TABS tablet TAKE 1 TABLET BY MOUTH EVERY DAY (Patient taking differently: Take 20 mg by mouth daily after supper.)   zolpidem (AMBIEN) 5 MG tablet TAKE 1 OR 2 TABLETS BY MOUTH NIGHTLY AT BEDTIME AS NEEDED (Patient taking differently: Take 5 mg by mouth at bedtime.)   No facility-administered encounter medications on file as of 06/14/2023.    Allergies (verified) Salmon [fish allergy], Sulfa antibiotics, and Metformin and related   History: Past Medical History:  Diagnosis Date   CALLUS, RIGHT FOOT 12/08/2009   Cardiac arrest (HCC)    Chronic systolic heart failure (HCC) 11/03/2009   DIABETES MELLITUS, TYPE II 12/05/2006   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/2009   HYPERLIPIDEMIA 11/25/2006   HYPOGONADISM, MALE 12/05/2006   Implantable cardiac defibrillator MDT 09/22/2008   Change out feb 2011, 2015   Ischemic cardiomyopathy 09/22/2008   DES CX and RCA  70% residual LAD  Done in [place]; EF 30%;; myoviewq 06/23/06 no ischemia   Kidney stones    Long term (current) use of anticoagulants 04/25/2010   Myocardial infarction [facility]) 22-Jun-2004   "I died and they brought me back"   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   NEPHROLITHIASIS, HX OF 02/25/2008   Persistent atrial fibrillation (HCC) 11/25/2006   Inapp shock 06-23-10 AF VR >220 recurrent   Past Surgical History:  Procedure Laterality Date   CARDIAC CATHETERIZATION     feburary 06-22-2012   CARDIAC DEFIBRILLATOR PLACEMENT     Guidant Vitality [name]   CARDIOVERSION N/A 04/25/2012   Procedure: CARDIOVERSION;  Surgeon: Driscilla George, MD;  Location: [facility] ENDOSCOPY;  Service: Cardiovascular;  Laterality: N/A;   CARDIOVERSION N/A 05/21/2012   Procedure: CARDIOVERSION;  Surgeon: Verona Goodwill, MD;  Location: [facility] ENDOSCOPY;  Service: Cardiovascular;  Laterality: N/A;   CATARACT EXTRACTION      EYE SURGERY     cataracts   IMPLANTABLE CARDIOVERTER DEFIBRILLATOR (ICD) GENERATOR CHANGE N/A 02/05/2014   Procedure: ICD GENERATOR CHANGE;  Surgeon: Verona Goodwill, MD;  Location: [facility] CATH LAB;  Service: Cardiovascular;  Laterality: N/A;   PTCA     TONSILLECTOMY     TONSILLECTOMY     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   Procedure: TRANSMETATARSAL AMPUTATION RIGHT;  Surgeon: Young Hensen, MD;  Location: [facility] OR;  Service: Vascular;  Laterality: Right;   Family History  Problem Relation Age of Onset   Diabetes Maternal Grandmother    Hyperlipidemia Maternal Grandmother    Breast cancer Mother    Cancer Neg Hx    COPD Neg Hx    Heart disease Neg  Hx    Social History   Socioeconomic History   Marital status: Divorced    Spouse name: Not on file   Number of children: 1   Years of education: 16   Highest education level: Not on file  Occupational History   Occupation: Musician business and show production    Employer: [employer]  Tobacco Use   Smoking status: Never    Passive exposure: Never   Smokeless tobacco: Never  Vaping Use   Vaping status: Never Used  Substance and Sexual Activity   Alcohol use: Yes    Alcohol/week: 0.0 standard drinks of alcohol    Comment: summer    Drug use: No   Sexual activity: Yes    Partners: Female  Other Topics Concern   Not on file  Social History Narrative   HSG, College grad. Married - divorced. 1 daughter. owner/operator Optician, dispensing business and show production   Social Drivers of Health   Financial Resource Strain: Low Risk  (06/14/2023)   Overall Financial Resource Strain (CARDIA)    Difficulty of Paying Living Expenses: Not hard at all  Food Insecurity: No Food Insecurity (06/14/2023)   Hunger Vital Sign    Worried About Running Out of Food in the Last Year: Never true    Ran Out of Food in the Last Year: Never true  Transportation Needs: No Transportation Needs (06/14/2023)   PRAPARE -  Administrator, Civil Service (Medical): No    Lack of Transportation (Non-Medical): No  Physical Activity: Inactive (06/14/2023)   Exercise Vital Sign    Days of Exercise per Week: 0 days    Minutes of Exercise per Session: 0 min  Stress: No Stress Concern Present (06/14/2023)   [employer] of Occupational Health - Occupational Stress Questionnaire    Feeling of Stress : Not at all  Social Connections: Moderately Isolated (06/14/2023)   Social Connection and Isolation Panel [NHANES]    Frequency of Communication with Friends and Family: More than three times a week    Frequency of Social Gatherings with Friends and Family: More than three times a week    Attends Religious Services: Never    Database administrator or Organizations: Yes    Attends Engineer, structural: More than 4 times per year    Marital Status: Divorced    Tobacco Counseling Counseling given: No    Clinical Intake:  Pre-visit preparation completed: Yes  Pain : No/denies pain     BMI - recorded: 26.79 Nutritional Status: BMI 25 -29 Overweight Nutritional Risks: None Diabetes: Yes CBG done?: Yes CBG resulted in Enter/ Edit results?: Yes (non-fasting = 132) Did pt. bring in CBG monitor from home?: No  Lab Results  Component Value Date   HGBA1C 6.3 (A) 06/01/2023   HGBA1C 7.2 (A) 11/21/2022   HGBA1C 7.3 (H) 08/24/2022     How often do you need to have someone help you when you read instructions, pamphlets, or other written materials from your doctor or pharmacy?: 1 - Never  Interpreter Needed?: No  Information entered by :: Hassell Halim, CMA   Activities of Daily Living     06/14/2023    3:47 PM 03/21/2023    9:19 PM  In your present state of health, do you have any difficulty performing the following activities:  Hearing? 0 1  Vision? 0 0  Difficulty concentrating or making decisions? 0 0  Walking or climbing stairs? 0  Dressing or bathing? 0   Doing errands,  shopping? 0   Preparing Food and eating ? N   Using the Toilet? N   In the past six months, have you accidently leaked urine? Y   Comment wears a deoend   Do you have problems with loss of bowel control? Y   Comment wears a depend - if outside for a long time   Managing your Medications? N   Managing your Finances? N   Housekeeping or managing your Housekeeping? N     Patient Care Team: Corwin Levins, MD as PCP - General (Internal Medicine) Duke Salvia, MD as PCP - Cardiology (Cardiology) [facility] as Consulting Physician (Ophthalmology)  Indicate any recent Medical Services you may have received from other than [facility] providers in the past year (date may be approximate).     Assessment:   This is a routine wellness examination for [place].  Hearing/Vision screen Hearing Screening - Comments:: Denies hearing difficulties  - wears hearing aids Vision Screening - Comments:: Wears rx glasses - up to date with routine eye exams with  [facility]   Goals Addressed               This Visit's Progress     Patient Stated (pt-stated)        Patient stated he plans to continue walking.       Depression Screen     06/14/2023    3:56 PM 04/10/2023   10:56 AM 08/24/2022    9:12 AM 03/18/2022    3:17 PM 08/09/2021   11:55 AM 09/11/2019    3:26 PM 10/11/2016    3:07 PM  PHQ 2/9 Scores  PHQ - 2 Score 0 0 0 2 2 1  0  PHQ- 9 Score 0   8 7      Fall Risk     06/14/2023    3:50 PM 04/10/2023   11:01 AM 08/24/2022    9:12 AM 03/18/2022    3:07 PM 08/09/2021   11:56 AM  Fall Risk   Falls in the past year? 1 0 0 0 1  Number falls in past yr: 0 0 0 0 0  Comment 1      Injury with Fall? 0 0 0 0 0  Risk for fall due to : History of fall(s) No Fall Risks No Fall Risks No Fall Risks   Follow up Falls evaluation completed;Falls prevention discussed Falls evaluation completed Falls evaluation completed Falls prevention discussed     MEDICARE RISK AT HOME:   Medicare Risk at Home Any stairs in or around the home?: No If so, are there any without handrails?: No Home free of loose throw rugs in walkways, pet beds, electrical cords, etc?: Yes Adequate lighting in your home to reduce risk of falls?: Yes Life alert?: Yes Use of a cane, walker or w/c?: No Grab bars in the bathroom?: Yes Shower chair or bench in shower?: No Elevated toilet seat or a handicapped toilet?: Yes  TIMED UP AND GO:  Was the test performed?  No  Cognitive Function: 6CIT completed        06/14/2023    3:51 PM 03/18/2022    3:19 PM  6CIT Screen  What Year? 0 points 0 points  What month? 0 points 0 points  What time? 0 points 0 points  Count back from 20 0 points 0 points  Months in reverse 0 points 0 points  Repeat phrase  0 points 0 points  Total Score 0 points 0 points    Immunizations Immunization History  Administered Date(s) Administered   Influenza Whole 12/02/2008, 03/02/2012   Influenza, High Dose Seasonal PF 12/26/2014, 12/05/2016, 11/30/2017, 12/28/2022   Influenza,inj,Quad PF,6+ Mos 12/18/2013, 12/02/2019   Influenza,inj,quad, With Preservative 11/29/2018   Influenza-Unspecified 11/28/2012, 11/30/2017   Moderna Sars-Covid-2 Vaccination 04/29/2019, 05/27/2019   PFIZER Comirnaty(Gray Top)Covid-19 Tri-Sucrose Vaccine 12/27/2021   Pfizer(Comirnaty)Fall Seasonal Vaccine 12 years and older 12/12/2022   Pneumococcal Conjugate-13 02/04/2014   Pneumococcal Polysaccharide-23 08/09/2021   Zoster Recombinant(Shingrix) 03/03/2017, 07/31/2017    Screening Tests Health Maintenance  Topic Date Due   DTaP/Tdap/Td (1 - Tdap) Never done   Colonoscopy  11/22/2010   COVID-19 Vaccine (5 - Mixed Product risk 2024-25 season) 06/12/2023   Diabetic kidney evaluation - Urine ACR  08/24/2023   FOOT EXAM  08/24/2023   INFLUENZA VACCINE  09/29/2023   HEMOGLOBIN A1C  12/01/2023   OPHTHALMOLOGY EXAM  12/14/2023   Diabetic kidney evaluation - eGFR measurement   03/30/2024   Medicare Annual Wellness (AWV)  06/13/2024   Pneumonia Vaccine 67+ Years old  Completed   Hepatitis C Screening  Completed   Zoster Vaccines- Shingrix  Completed   HPV VACCINES  Aged Out   Meningococcal B Vaccine  Aged Out    Health Maintenance  Health Maintenance Due  Topic Date Due   DTaP/Tdap/Td (1 - Tdap) Never done   Colonoscopy  11/22/2010   COVID-19 Vaccine (5 - Mixed Product risk 2024-25 season) 06/12/2023   Health Maintenance Items Addressed: 06/14/2023   Additional Screening:  Vision Screening: Recommended annual ophthalmology exams for early detection of glaucoma and other disorders of the eye.  Dental Screening: Recommended annual dental exams for proper oral hygiene  Community Resource Referral / Chronic Care Management: CRR required this visit?  No   CCM required this visit?  No     Plan:     I have personally reviewed and noted the following in the patient's chart:   Medical and social history Use of alcohol, tobacco or illicit drugs  Current medications and supplements including opioid prescriptions. Patient is not currently taking opioid prescriptions. Functional ability and status Nutritional status Physical activity Advanced directives List of other physicians Hospitalizations, surgeries, and ER visits in previous 12 months Vitals Screenings to include cognitive, depression, and falls Referrals and appointments  In addition, I have reviewed and discussed with patient certain preventive protocols, quality metrics, and best practice recommendations. A written personalized care plan for preventive services as well as general preventive health recommendations were provided to patient.     Patria Bookbinder, CMA   06/14/2023   After Visit Summary: (In Person-Declined) Patient declined AVS at this time.  Notes: Nothing significant to report at this time.

## 2023-06-14 NOTE — Patient Instructions (Addendum)
 [name] , Thank you for taking time to come for your Medicare Wellness Visit. I appreciate your ongoing commitment to your health goals. Please review the following plan we discussed and let me know if I can assist you in the future.   Referrals/Orders/Follow-Ups/Clinician Recommendations: Aim for 30 minutes of exercise or brisk walking, 6-8 glasses of water, and 5 servings of fruits and vegetables each day. Educated and advised on getting the Tdap and COVID vaccines in 2025.  This is a list of the screening recommended for you and due dates:  Health Maintenance  Topic Date Due   DTaP/Tdap/Td vaccine (1 - Tdap) Never done   Colon Cancer Screening  11/22/2010   COVID-19 Vaccine (5 - Mixed Product risk 2024-25 season) 06/12/2023   Yearly kidney health urinalysis for diabetes  08/24/2023   Complete foot exam   08/24/2023   Flu Shot  09/29/2023   Hemoglobin A1C  12/01/2023   Eye exam for diabetics  12/14/2023   Yearly kidney function blood test for diabetes  03/30/2024   Medicare Annual Wellness Visit  06/13/2024   Pneumonia Vaccine  Completed   Hepatitis C Screening  Completed   Zoster (Shingles) Vaccine  Completed   HPV Vaccine  Aged Out   Meningitis B Vaccine  Aged Out    Advanced directives: (Copy Requested) Please bring a copy of your health care power of attorney and living will to the office to be added to your chart at your convenience. You can mail to [facility] [address] [place], [place] [address] or email to [email or web address]  Next Medicare Annual Wellness Visit scheduled for next year: Yes

## 2023-06-15 DIAGNOSIS — E1159 Type 2 diabetes mellitus with other circulatory complications: Secondary | ICD-10-CM | POA: Diagnosis not present

## 2023-06-15 DIAGNOSIS — I5020 Unspecified systolic (congestive) heart failure: Secondary | ICD-10-CM | POA: Diagnosis not present

## 2023-06-15 DIAGNOSIS — Z794 Long term (current) use of insulin: Secondary | ICD-10-CM | POA: Diagnosis not present

## 2023-06-15 DIAGNOSIS — I25118 Atherosclerotic heart disease of native coronary artery with other forms of angina pectoris: Secondary | ICD-10-CM | POA: Diagnosis not present

## 2023-06-22 ENCOUNTER — Ambulatory Visit (INDEPENDENT_AMBULATORY_CARE_PROVIDER_SITE_OTHER): Admitting: Internal Medicine

## 2023-06-22 ENCOUNTER — Encounter: Payer: Self-pay | Admitting: Internal Medicine

## 2023-06-22 VITALS — BP 122/64 | HR 76 | Temp 97.6°F | Ht 70.5 in | Wt 185.0 lb

## 2023-06-22 DIAGNOSIS — I10 Essential (primary) hypertension: Secondary | ICD-10-CM | POA: Diagnosis not present

## 2023-06-22 DIAGNOSIS — Z7984 Long term (current) use of oral hypoglycemic drugs: Secondary | ICD-10-CM

## 2023-06-22 DIAGNOSIS — E1159 Type 2 diabetes mellitus with other circulatory complications: Secondary | ICD-10-CM

## 2023-06-22 DIAGNOSIS — E1165 Type 2 diabetes mellitus with hyperglycemia: Secondary | ICD-10-CM | POA: Diagnosis not present

## 2023-06-22 DIAGNOSIS — A630 Anogenital (venereal) warts: Secondary | ICD-10-CM | POA: Diagnosis not present

## 2023-06-22 DIAGNOSIS — N481 Balanitis: Secondary | ICD-10-CM | POA: Diagnosis not present

## 2023-06-22 DIAGNOSIS — Z794 Long term (current) use of insulin: Secondary | ICD-10-CM

## 2023-06-22 MED ORDER — KETOCONAZOLE 2 % EX CREA: 1.0000 | TOPICAL_CREAM | Freq: Every day | CUTANEOUS | 0 refills | Status: DC

## 2023-06-22 NOTE — Progress Notes (Signed)
 Patient ID: [name], male   DOB: March 15, 1945, 78 y.o.   MRN: [id number]        Chief Complaint: follow up balanitis, penile wart, perianal wart, htn, dm       HPI:  [name] is a 78 y.o. male here with wife, has redness but little pain to the dorsal penis, near a dorsal penile wart, and has another wart lesion to the perianal area.  Pt denies chest pain, increased sob or doe, wheezing, orthopnea, PND, increased LE swelling, palpitations, dizziness or syncope.   Pt denies polydipsia, polyuria, or new focal neuro s/s.          Wt Readings from Last 3 Encounters:  06/22/23 185 lb (83.9 kg)  06/14/23 189 lb 6.4 oz (85.9 kg)  06/01/23 187 lb (84.8 kg)   BP Readings from Last 3 Encounters:  06/22/23 122/64  06/14/23 108/60  06/01/23 120/70         Past Medical History:  Diagnosis Date   CALLUS, RIGHT FOOT 12/08/2009   Cardiac arrest [facility])    Chronic systolic heart failure (HCC) 11/03/2009   DIABETES MELLITUS, TYPE II 12/05/2006   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/2009   HYPERLIPIDEMIA 11/25/2006   HYPOGONADISM, MALE 12/05/2006   Implantable cardiac defibrillator MDT 09/22/2008   Change out feb 2011, 2015   Ischemic cardiomyopathy 09/22/2008   DES CX and RCA  70% residual LAD  Done in [place]; EF 30%;; myoviewq August 14, 2006 no ischemia   Kidney stones    Long term (current) use of anticoagulants 04/25/2010   Myocardial infarction [facility]) 2004/08/13   "I died and they brought me back"   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   NEPHROLITHIASIS, HX OF 02/25/2008   Persistent atrial fibrillation (HCC) 11/25/2006   Inapp shock 2010/08/14 AF VR >220 recurrent   Past Surgical History:  Procedure Laterality Date   CARDIAC CATHETERIZATION     feburary 08/13/2012   CARDIAC DEFIBRILLATOR PLACEMENT     Guidant Vitality [name]   CARDIOVERSION N/A 04/25/2012   Procedure: CARDIOVERSION;  Surgeon: Driscilla George, MD;  Location: [facility] ENDOSCOPY;  Service: Cardiovascular;  Laterality: N/A;   CARDIOVERSION N/A  05/21/2012   Procedure: CARDIOVERSION;  Surgeon: Verona Goodwill, MD;  Location: [facility] ENDOSCOPY;  Service: Cardiovascular;  Laterality: N/A;   CATARACT EXTRACTION     EYE SURGERY     cataracts   IMPLANTABLE CARDIOVERTER DEFIBRILLATOR (ICD) GENERATOR CHANGE N/A 02/05/2014   Procedure: ICD GENERATOR CHANGE;  Surgeon: Verona Goodwill, MD;  Location: [facility] CATH LAB;  Service: Cardiovascular;  Laterality: N/A;   PTCA     TONSILLECTOMY     TONSILLECTOMY     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 He has never been exposed to tobacco smoke. He has never used smokeless tobacco. He reports current alcohol use. He reports that he does not use drugs. family history includes Breast cancer in his mother; Diabetes in his maternal grandmother; Hyperlipidemia in his maternal grandmother. Allergies  Allergen Reactions   Salmon [Fish Allergy] Other (See Comments)    Only canned/ not fresh (probably preservatives)   Sulfa  Antibiotics Other (See Comments)    Not sure he has an allergy to this (??)   Metformin  And Related Diarrhea   Current Outpatient Medications on File Prior to Visit  Medication Sig Dispense Refill  aspirin  EC 81 MG tablet Take 81 mg by mouth daily. Swallow whole.     Cholecalciferol (VITAMIN D3 PO) Take 1 capsule by mouth daily.     citalopram  (CELEXA ) 40 MG tablet Take 40 mg by mouth daily.     Continuous Glucose Receiver (FREESTYLE LIBRE 2 READER) DEVI Use as instructure to check blood sugar 1 each 0   Continuous Glucose Sensor (FREESTYLE LIBRE 2 SENSOR) MISC Apply new sensor every 14 days (Patient taking differently: Inject 1 Device into the skin every 14 (fourteen) days.) 6 each 3   Cyanocobalamin  (VITAMIN B12 PO) Take 1 tablet by mouth daily.     dofetilide  (TIKOSYN ) 250 MCG capsule Take 1 capsule (250 mcg  total) by mouth every 12 (twelve) hours.     empagliflozin  (JARDIANCE ) 25 MG TABS tablet Take 25 mg by mouth daily.     eplerenone (INSPRA) 50 MG tablet Take 50 mg by mouth in the morning.     FLUoxetine  (PROZAC ) 10 MG capsule Take 1 capsule (10 mg total) by mouth daily. 90 capsule 3   fluticasone  (FLONASE ) 50 MCG/ACT nasal spray USE 2 SPRAYS IN EACH NOSTRIL EVERY DAY (Patient taking differently: Place 2 sprays into both nostrils daily as needed for allergies or rhinitis.) 16 g 5   Glucose Blood (FREESTYLE PRECISION NEO TEST VI) by In Vitro route.     glucose blood (ONE TOUCH ULTRA TEST) test strip 1 each by Other route 2 (two) times daily. 200 each 2   insulin  lispro (HUMALOG  KWIKPEN) 100 UNIT/ML KwikPen INJECT 10-14 units under skin before meals, 3 times a day, as advised 15 mL 1   Lancets MISC Use as directed up to 4 times per day 100 each 11   LANTUS  SOLOSTAR 100 UNIT/ML Solostar Pen INJECT 14 UNITS UNDER THE SKIN DAILY (Patient taking differently: Inject 14 Units into the skin daily before breakfast.) 15 mL 11   liraglutide (VICTOZA) 18 MG/3ML SOPN Inject 1.8 mg into the skin in the morning.     LORazepam  (ATIVAN ) 0.5 MG tablet Take 1 tablet (0.5 mg total) by mouth 2 (two) times daily as needed for anxiety. 10 tablet 0   Magnesium  Oxide (MAG-OX 400 PO) Take 400 mg by mouth 2 (two) times daily.     metoprolol  (TOPROL -XL) 200 MG 24 hr tablet Take 200 mg by mouth daily after supper.     Multiple Vitamins-Minerals (CENTRUM SILVER 50+MEN) TABS Take 1 tablet by mouth daily with breakfast.     NITROSTAT  0.4 MG SL tablet Place 1 tablet (0.4 mg total) under the tongue every 5 (five) minutes as needed for chest pain. 25 tablet 5   Pyridoxine HCl (VITAMIN B6 PO) Take 1 tablet by mouth daily.     rosuvastatin  (CRESTOR ) 20 MG tablet TAKE 1 TABLET BY MOUTH EVERY DAY (Patient taking differently: Take 20 mg by mouth at bedtime.) 90 tablet 0   sacubitril -valsartan  (ENTRESTO ) 24-26 MG Take 1 tablet by mouth 2  (two) times daily. Please hold for SBP < 100     torsemide  (DEMADEX ) 10 MG tablet Take 1 tablet (10 mg total) by mouth daily.     ULTICARE MICRO PEN NEEDLES 32G X 4 MM MISC USE DAILY AS DIRECTED 100 each 3   XARELTO  20 MG TABS tablet TAKE 1 TABLET BY MOUTH EVERY DAY (Patient taking differently: Take 20 mg by mouth daily after supper.) 30 tablet 5   zolpidem  (AMBIEN ) 5 MG tablet TAKE 1 OR 2 TABLETS BY MOUTH NIGHTLY AT  BEDTIME AS NEEDED (Patient taking differently: Take 5 mg by mouth at bedtime.) 30 tablet 0   No current facility-administered medications on file prior to visit.        ROS:  All others reviewed and negative.  Objective        PE:  BP 122/64 (BP Location: Right Arm, Patient Position: Sitting, Cuff Size: Normal)   Pulse 76   Temp 97.6 F (36.4 C) (Oral)   Ht 5' 10.5" (1.791 m)   Wt 185 lb (83.9 kg)   SpO2 99%   BMI 26.17 kg/m                 Constitutional: Pt appears in NAD               HENT: Head: NCAT.                Right Ear: External ear normal.                 Left Ear: External ear normal.                Eyes: . Pupils are equal, round, and reactive to light. Conjunctivae and EOM are normal               Nose: without d/c or deformity               Neck: Neck supple. Gross normal ROM               Cardiovascular: Normal rate and regular rhythm.                 Pulmonary/Chest: Effort normal and breath sounds without rales or wheezing.                Abd:  Soft, NT, ND, + BS, no organomegaly               Neurological: Pt is alert. At baseline orientation, motor grossly intact               Skin: Skin is warm. LE edema - none, nontender erythema noted to glans and dorsal penis, also 8 mm non tender warty lesion noted mid shaft as well;  also has 15 mm wart lesion left perianal as well               Psychiatric: Pt behavior is normal without agitation   Micro: none  Cardiac tracings I have personally interpreted today:  none  Pertinent Radiological findings  (summarize): none   Lab Results  Component Value Date   WBC 9.7 03/30/2023   HGB 15.4 03/30/2023   HCT 46.6 03/30/2023   PLT 173 03/30/2023   GLUCOSE 165 (H) 03/31/2023   CHOL 122 08/24/2022   TRIG 270.0 (H) 08/24/2022   HDL 35.60 (L) 08/24/2022   LDLDIRECT 63.0 08/24/2022   LDLCALC 60 08/09/2021   ALT 58 (H) 03/30/2023   AST 27 03/30/2023   NA 133 (L) 03/31/2023   K 3.7 03/31/2023   CL 92 (L) 03/31/2023   CREATININE 1.06 03/31/2023   BUN 46 (H) 03/31/2023   CO2 30 03/31/2023   TSH 4.71 08/24/2022   PSA 0.49 08/24/2022   INR 2.0 (H) 03/21/2023   HGBA1C 6.3 (A) 06/01/2023   MICROALBUR <0.7 08/24/2022   Assessment/Plan:  [name] is a 78 y.o. White or Caucasian [1] male with  has a past medical history of CALLUS, RIGHT FOOT (12/08/2009), Cardiac arrest [facility]), Chronic  systolic heart failure (HCC) (0/10/8117), DIABETES MELLITUS, TYPE II (12/05/2006), DIABETIC PERIPHERAL NEUROPATHY (12/08/2009), HYPERLIPIDEMIA (11/25/2006), HYPOGONADISM, MALE (12/05/2006), Implantable cardiac defibrillator MDT (09/22/2008), Ischemic cardiomyopathy (09/22/2008), Kidney stones, Long term (current) use of anticoagulants (04/25/2010), Myocardial infarction (HCC) (2006), Neoplasm of uncertain behavior of skin (06/05/2007), NEPHROLITHIASIS, HX OF (02/25/2008), and Persistent atrial fibrillation (HCC) (11/25/2006).  Poorly controlled type 2 diabetes mellitus with circulatory disorder (HCC) Lab Results  Component Value Date   HGBA1C 6.3 (A) 06/01/2023   Stable, pt to continue current medical treatment jardiance  25 every day, humalog  SSI, lantus  14 u every day, victoza 1.8 mg weekly   HTN (hypertension) BP Readings from Last 3 Encounters:  06/22/23 122/64  06/14/23 108/60  06/01/23 120/70   Stable, pt to continue medical treatment toprol  xl 200 every day,    Anal wart Non irritated, pt declines referral for removal  Penile wart Smaller asympt, pt declines referral for removal  Balanitis Mild,  likely fungal, for ketoconozole cream asd prn  Followup: Return in about 6 months (around 12/22/2023), or if symptoms worsen or fail to improve.  Rosalia Colonel, MD 06/22/2023 12:52 PM [facility]

## 2023-06-22 NOTE — Assessment & Plan Note (Signed)
 BP Readings from Last 3 Encounters:  06/22/23 122/64  06/14/23 108/60  06/01/23 120/70   Stable, pt to continue medical treatment toprol  xl 200 every day,

## 2023-06-22 NOTE — Patient Instructions (Signed)
 Please take all new medication as prescribed- the cream for the rash  Ok to watch the Warts to the penis and peri-rectal area for now  Please continue all other medications as before, and refills have been done if requested.  Please have the pharmacy call with any other refills you may need.  Please continue your efforts at being more active, low cholesterol diet, and weight control.  You are otherwise up to date with prevention measures today.  Please keep your appointments with your specialists as you may have planned

## 2023-06-22 NOTE — Assessment & Plan Note (Signed)
 Non irritated, pt declines referral for removal

## 2023-06-22 NOTE — Assessment & Plan Note (Signed)
 Lab Results  Component Value Date   HGBA1C 6.3 (A) 06/01/2023   Stable, pt to continue current medical treatment jardiance  25 every day, humalog  SSI, lantus  14 u every day, victoza 1.8 mg weekly

## 2023-06-22 NOTE — Assessment & Plan Note (Signed)
 Mild, likely fungal, for ketoconozole cream asd prn

## 2023-06-22 NOTE — Assessment & Plan Note (Signed)
 Smaller asympt, pt declines referral for removal

## 2023-06-29 DIAGNOSIS — Z4502 Encounter for adjustment and management of automatic implantable cardiac defibrillator: Secondary | ICD-10-CM | POA: Diagnosis not present

## 2023-06-29 DIAGNOSIS — I5042 Chronic combined systolic (congestive) and diastolic (congestive) heart failure: Secondary | ICD-10-CM | POA: Diagnosis not present

## 2023-07-03 DIAGNOSIS — L851 Acquired keratosis [keratoderma] palmaris et plantaris: Secondary | ICD-10-CM | POA: Diagnosis not present

## 2023-07-03 DIAGNOSIS — B351 Tinea unguium: Secondary | ICD-10-CM | POA: Diagnosis not present

## 2023-07-03 DIAGNOSIS — E1142 Type 2 diabetes mellitus with diabetic polyneuropathy: Secondary | ICD-10-CM | POA: Diagnosis not present

## 2023-07-10 ENCOUNTER — Ambulatory Visit: Payer: Medicare Other | Admitting: Internal Medicine

## 2023-07-31 DIAGNOSIS — Z4502 Encounter for adjustment and management of automatic implantable cardiac defibrillator: Secondary | ICD-10-CM | POA: Diagnosis not present

## 2023-07-31 DIAGNOSIS — I5042 Chronic combined systolic (congestive) and diastolic (congestive) heart failure: Secondary | ICD-10-CM | POA: Diagnosis not present

## 2023-08-23 DIAGNOSIS — I5042 Chronic combined systolic (congestive) and diastolic (congestive) heart failure: Secondary | ICD-10-CM | POA: Diagnosis not present

## 2023-08-25 ENCOUNTER — Other Ambulatory Visit: Payer: Self-pay

## 2023-08-25 ENCOUNTER — Other Ambulatory Visit: Payer: Self-pay | Admitting: Internal Medicine

## 2023-08-31 DIAGNOSIS — Z4509 Encounter for adjustment and management of other cardiac device: Secondary | ICD-10-CM | POA: Diagnosis not present

## 2023-08-31 DIAGNOSIS — I5042 Chronic combined systolic (congestive) and diastolic (congestive) heart failure: Secondary | ICD-10-CM | POA: Diagnosis not present

## 2023-09-07 DIAGNOSIS — Z95810 Presence of automatic (implantable) cardiac defibrillator: Secondary | ICD-10-CM | POA: Diagnosis not present

## 2023-09-07 DIAGNOSIS — Z4502 Encounter for adjustment and management of automatic implantable cardiac defibrillator: Secondary | ICD-10-CM | POA: Diagnosis not present

## 2023-09-08 DIAGNOSIS — I5020 Unspecified systolic (congestive) heart failure: Secondary | ICD-10-CM | POA: Diagnosis not present

## 2023-09-11 DIAGNOSIS — L851 Acquired keratosis [keratoderma] palmaris et plantaris: Secondary | ICD-10-CM | POA: Diagnosis not present

## 2023-09-11 DIAGNOSIS — E1142 Type 2 diabetes mellitus with diabetic polyneuropathy: Secondary | ICD-10-CM | POA: Diagnosis not present

## 2023-09-11 DIAGNOSIS — B351 Tinea unguium: Secondary | ICD-10-CM | POA: Diagnosis not present

## 2023-09-19 DIAGNOSIS — I5020 Unspecified systolic (congestive) heart failure: Secondary | ICD-10-CM | POA: Diagnosis not present

## 2023-10-02 ENCOUNTER — Ambulatory Visit (INDEPENDENT_AMBULATORY_CARE_PROVIDER_SITE_OTHER): Admitting: Internal Medicine

## 2023-10-02 ENCOUNTER — Encounter: Payer: Self-pay | Admitting: Internal Medicine

## 2023-10-02 VITALS — BP 90/60 | HR 78 | Ht 70.5 in | Wt 190.0 lb

## 2023-10-02 DIAGNOSIS — E1142 Type 2 diabetes mellitus with diabetic polyneuropathy: Secondary | ICD-10-CM

## 2023-10-02 DIAGNOSIS — E1165 Type 2 diabetes mellitus with hyperglycemia: Secondary | ICD-10-CM

## 2023-10-02 DIAGNOSIS — E1159 Type 2 diabetes mellitus with other circulatory complications: Secondary | ICD-10-CM | POA: Diagnosis not present

## 2023-10-02 DIAGNOSIS — Z794 Long term (current) use of insulin: Secondary | ICD-10-CM

## 2023-10-02 DIAGNOSIS — E785 Hyperlipidemia, unspecified: Secondary | ICD-10-CM | POA: Diagnosis not present

## 2023-10-02 DIAGNOSIS — E1122 Type 2 diabetes mellitus with diabetic chronic kidney disease: Secondary | ICD-10-CM

## 2023-10-02 DIAGNOSIS — E0800 Diabetes mellitus due to underlying condition with hyperosmolarity without nonketotic hyperglycemic-hyperosmolar coma (NKHHC): Secondary | ICD-10-CM

## 2023-10-02 DIAGNOSIS — Z7985 Long-term (current) use of injectable non-insulin antidiabetic drugs: Secondary | ICD-10-CM

## 2023-10-02 DIAGNOSIS — I5042 Chronic combined systolic (congestive) and diastolic (congestive) heart failure: Secondary | ICD-10-CM | POA: Diagnosis not present

## 2023-10-02 DIAGNOSIS — N189 Chronic kidney disease, unspecified: Secondary | ICD-10-CM

## 2023-10-02 DIAGNOSIS — Z4502 Encounter for adjustment and management of automatic implantable cardiac defibrillator: Secondary | ICD-10-CM | POA: Diagnosis not present

## 2023-10-02 LAB — POCT GLYCOSYLATED HEMOGLOBIN (HGB A1C): Hemoglobin A1C: 6.6 % — AB (ref 4.0–5.6)

## 2023-10-02 NOTE — Progress Notes (Signed)
 Patient ID: [name], male   DOB: 02-Nov-1945, 78 y.o.   MRN: [id number]  HPI: [name] is a 78 y.o.-year-old male, returning for follow-up for DM2, dx in 13-Oct-2006, insulin -dependent since 12-Oct-2016, uncontrolled, with complications (CAD, CHF, CKD, PN, s/p trans metatarsal amputation). Pt. previously saw Dr. Kassie, but last visit with me visit 4 months ago.    Interim history: No increased urination, nausea, chest pain.  He does have some blurry vision. He was admitted in 03/2023 for wound infection and increased CK.  He saw wound care (Dr. Marolyn).  This is healed.  Reviewed HbA1c: 06/15/2023: HbA1c 6.4% Lab Results  Component Value Date   HGBA1C 6.3 (A) 06/01/2023   HGBA1C 7.2 (A) 11/21/2022   HGBA1C 7.3 (H) 08/24/2022   HGBA1C 6.9 (A) 07/21/2022   HGBA1C 6.6 (A) 04/01/2022   HGBA1C 6.7 (A) 12/17/2021   HGBA1C 7.6 (H) 08/09/2021   HGBA1C 7.1 (A) 06/15/2021   HGBA1C 6.9 (A) 03/09/2021   HGBA1C 6.3 (A) 04/01/2020   Pt is on a regimen of: - Lantus  14 units in am - Humalog  01-08-09 units before or after meals >> (10)-(10)-(5-9) >> 10 units 15 min before the 3 meals >> 10-12 units before each meal - Victoza 1.8 mg at night  >> in amMetformin caused nausea. He declines a V-Go device due to cost.  Pt checks his sugars >4x a day - Libre CGM (receiver):  Previously:  Prev.:   Lowest sugar was 40s >> .SABRA.60s >> 60s; he has hypoglycemia awareness at 70.  Highest sugar was upper 200s >> 300s >> 200s.  Glucometer:?  - + CKD, last BUN/creatinine:   Lab Results  Component Value Date   BUN 46 (H) 03/31/2023   BUN 50 (H) 03/30/2023   CREATININE 1.06 03/31/2023   CREATININE 0.91 03/30/2023  07/20/2022: 14/0.94, GFR 85  No results found for: MICRALBCREAT He is not on ACE inhibitor or ARB.  -+ HL; last set of lipids:  Lab Results  Component Value Date   CHOL 122 08/24/2022   HDL 35.60 (L) 08/24/2022   LDLCALC 60 08/09/2021   LDLDIRECT 63.0 08/24/2022   TRIG 270.0 (H)  08/24/2022   CHOLHDL 3 08/24/2022  On Crestor  20 mg daily.  - last eye exam was 11/2022. No DR reportedly.   - no numbness and tingling in his feet.  Last foot exam 08/24/2022.  He has a history of nephrolithiasis, hypogonadism. He was in a clinical trial  - a supplement that helps with heart condition >> improves stamina and SOB.  ROS: + see HPI  Past Medical History:  Diagnosis Date   CALLUS, RIGHT FOOT 12/08/2009   Cardiac arrest (HCC)    Chronic systolic heart failure (HCC) 11/03/2009   DIABETES MELLITUS, TYPE II 12/05/2006   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/2009   HYPERLIPIDEMIA 11/25/2006   HYPOGONADISM, MALE 12/05/2006   Implantable cardiac defibrillator MDT 09/22/2008   Change out feb 2011, 2015   Ischemic cardiomyopathy 09/22/2008   DES CX and RCA  70% residual LAD  Done in [place]; EF 30%;; myoviewq 2006-10-13 no ischemia   Kidney stones    Long term (current) use of anticoagulants 04/25/2010   Myocardial infarction (HCC) 10/12/2004   I died and they brought me back   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   NEPHROLITHIASIS, HX OF 02/25/2008   Persistent atrial fibrillation (HCC) 11/25/2006   Inapp shock October 13, 2010 AF VR >220 recurrent   Past Surgical History:  Procedure Laterality Date  CARDIAC CATHETERIZATION     feburary 2014   CARDIAC DEFIBRILLATOR PLACEMENT     Guidant Vitality [name]   CARDIOVERSION N/A 04/25/2012   Procedure: CARDIOVERSION;  Surgeon: Debby Gallop, MD;  Location: [facility] ENDOSCOPY;  Service: Cardiovascular;  Laterality: N/A;   CARDIOVERSION N/A 05/21/2012   Procedure: CARDIOVERSION;  Surgeon: Elspeth JAYSON Sage, MD;  Location[facility] ENDOSCOPY;  Service: Cardiovascular;  Laterality: N/A;   CATARACT EXTRACTION     EYE SURGERY     cataracts   IMPLANTABLE CARDIOVERTER DEFIBRILLATOR (ICD) GENERATOR CHANGE N/A 02/05/2014   Procedure: ICD GENERATOR CHANGE;  Surgeon: Elspeth JAYSON Sage, MD;  Location: [facility] CATH LAB;  Service: Cardiovascular;  Laterality: N/A;   PTCA      TONSILLECTOMY     TONSILLECTOMY     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   Procedure: TRANSMETATARSAL AMPUTATION RIGHT;  Surgeon: Gretta Lonni PARAS, MD;  Location: [facility] OR;  Service: Vascular;  Laterality: Right;   Social History   Socioeconomic History   Marital status: Divorced    Spouse name: Not on file   Number of children: 1   Years of education: 16   Highest education level: Not on file  Occupational History   Occupation: Musician business and show production    Employer: [employer]  Tobacco Use   Smoking status: Never    Passive exposure: Never   Smokeless tobacco: Never  Vaping Use   Vaping status: Never Used  Substance and Sexual Activity   Alcohol use: Yes    Alcohol/week: 0.0 standard drinks of alcohol    Comment: summer    Drug use: No   Sexual activity: Yes    Partners: Female  Other Topics Concern   Not on file  Social History Narrative   HSG, College grad. Married - divorced. 1 daughter. owner/operator Optician, dispensing business and show production   Social Drivers of Health   Financial Resource Strain: Low Risk  (06/14/2023)   Overall Financial Resource Strain (CARDIA)    Difficulty of Paying Living Expenses: Not hard at all  Food Insecurity: No Food Insecurity (06/14/2023)   Hunger Vital Sign    Worried About Running Out of Food in the Last Year: Never true    Ran Out of Food in the Last Year: Never true  Transportation Needs: No Transportation Needs (06/14/2023)   PRAPARE - Administrator, Civil Service (Medical): No    Lack of Transportation (Non-Medical): No  Physical Activity: Inactive (06/14/2023)   Exercise Vital Sign    Days of Exercise per Week: 0 days    Minutes of Exercise per Session: 0 min  Stress: No Stress Concern Present (06/14/2023)   [employer] of Occupational Health - Occupational Stress Questionnaire    Feeling of Stress : Not at all  Social Connections: Moderately Isolated (06/14/2023)    Social Connection and Isolation Panel    Frequency of Communication with Friends and Family: More than three times a week    Frequency of Social Gatherings with Friends and Family: More than three times a week    Attends Religious Services: Never    Database administrator or Organizations: Yes    Attends Engineer, structural: More than 4 times per year    Marital Status: Divorced  Intimate Partner Violence: Not At Risk (06/14/2023)   Humiliation, Afraid, Rape, and Kick questionnaire    Fear of Current or Ex-Partner: No    Emotionally Abused: No    Physically  Abused: No    Sexually Abused: No   Current Outpatient Medications on File Prior to Visit  Medication Sig Dispense Refill   aspirin  EC 81 MG tablet Take 81 mg by mouth daily. Swallow whole.     Cholecalciferol (VITAMIN D3 PO) Take 1 capsule by mouth daily.     citalopram  (CELEXA ) 40 MG tablet Take 40 mg by mouth daily.     Continuous Glucose Receiver (FREESTYLE LIBRE 2 READER) DEVI Use as instructure to check blood sugar 1 each 0   Continuous Glucose Sensor (FREESTYLE LIBRE 2 SENSOR) MISC Apply new sensor every 14 days (Patient taking differently: Inject 1 Device into the skin every 14 (fourteen) days.) 6 each 3   Cyanocobalamin  (VITAMIN B12 PO) Take 1 tablet by mouth daily.     dofetilide  (TIKOSYN ) 250 MCG capsule Take 1 capsule (250 mcg total) by mouth every 12 (twelve) hours.     empagliflozin  (JARDIANCE ) 25 MG TABS tablet Take 25 mg by mouth daily.     eplerenone (INSPRA) 50 MG tablet Take 50 mg by mouth in the morning.     FLUoxetine  (PROZAC ) 10 MG capsule TAKE 1 CAPSULE BY MOUTH EVERY DAY 90 capsule 3   fluticasone  (FLONASE ) 50 MCG/ACT nasal spray USE 2 SPRAYS IN EACH NOSTRIL EVERY DAY (Patient taking differently: Place 2 sprays into both nostrils daily as needed for allergies or rhinitis.) 16 g 5   Glucose Blood (FREESTYLE PRECISION NEO TEST VI) by In Vitro route.     glucose blood (ONE TOUCH ULTRA TEST) test strip 1  each by Other route 2 (two) times daily. 200 each 2   insulin  lispro (HUMALOG  KWIKPEN) 100 UNIT/ML KwikPen INJECT 10-14 units under skin before meals, 3 times a day, as advised 15 mL 1   ketoconazole  (NIZORAL ) 2 % cream Apply 1 Application topically daily. 30 g 0   Lancets MISC Use as directed up to 4 times per day 100 each 11   LANTUS  SOLOSTAR 100 UNIT/ML Solostar Pen INJECT 14 UNITS UNDER THE SKIN DAILY (Patient taking differently: Inject 14 Units into the skin daily before breakfast.) 15 mL 11   liraglutide (VICTOZA) 18 MG/3ML SOPN Inject 1.8 mg into the skin in the morning.     LORazepam  (ATIVAN ) 0.5 MG tablet Take 1 tablet (0.5 mg total) by mouth 2 (two) times daily as needed for anxiety. 10 tablet 0   Magnesium  Oxide (MAG-OX 400 PO) Take 400 mg by mouth 2 (two) times daily.     metoprolol  (TOPROL -XL) 200 MG 24 hr tablet Take 200 mg by mouth daily after supper.     Multiple Vitamins-Minerals (CENTRUM SILVER 50+MEN) TABS Take 1 tablet by mouth daily with breakfast.     NITROSTAT  0.4 MG SL tablet Place 1 tablet (0.4 mg total) under the tongue every 5 (five) minutes as needed for chest pain. 25 tablet 5   Pyridoxine HCl (VITAMIN B6 PO) Take 1 tablet by mouth daily.     rosuvastatin  (CRESTOR ) 20 MG tablet TAKE 1 TABLET BY MOUTH EVERY DAY (Patient taking differently: Take 20 mg by mouth at bedtime.) 90 tablet 0   sacubitril -valsartan  (ENTRESTO ) 24-26 MG Take 1 tablet by mouth 2 (two) times daily. Please hold for SBP < 100     torsemide  (DEMADEX ) 10 MG tablet Take 1 tablet (10 mg total) by mouth daily.     ULTICARE MICRO PEN NEEDLES 32G X 4 MM MISC USE DAILY AS DIRECTED 100 each 3   XARELTO  20 MG TABS tablet  TAKE 1 TABLET BY MOUTH EVERY DAY (Patient taking differently: Take 20 mg by mouth daily after supper.) 30 tablet 5   zolpidem  (AMBIEN ) 5 MG tablet TAKE 1 OR 2 TABLETS BY MOUTH NIGHTLY AT BEDTIME AS NEEDED (Patient taking differently: Take 5 mg by mouth at bedtime.) 30 tablet 0   No current  facility-administered medications on file prior to visit.   Allergies  Allergen Reactions   Salmon [Fish Allergy] Other (See Comments)    Only canned/ not fresh (probably preservatives)   Sulfa  Antibiotics Other (See Comments)    Not sure he has an allergy to this (??)   Metformin  And Related Diarrhea   Family History  Problem Relation Age of Onset   Diabetes Maternal Grandmother    Hyperlipidemia Maternal Grandmother    Breast cancer Mother    Cancer Neg Hx    COPD Neg Hx    Heart disease Neg Hx    PE: BP 90/60   Pulse 78   Ht 5' 10.5 (1.791 m)   Wt 190 lb (86.2 kg)   SpO2 95%   BMI 26.88 kg/m  Wt Readings from Last 3 Encounters:  10/02/23 190 lb (86.2 kg)  06/22/23 185 lb (83.9 kg)  06/14/23 189 lb 6.4 oz (85.9 kg)   Constitutional: overweight, in NAD Eyes: no exophthalmos ENT: no thyromegaly, no cervical lymphadenopathy Cardiovascular: RRR, No MRG Respiratory: CTA B Musculoskeletal: + deformities: Transmetatarsal amputation Skin: no rashes Neurological: no tremor with outstretched hands Diabetic Foot Exam - Simple   Simple Foot Form Diabetic Foot exam was performed with the following findings: Yes 10/02/2023 11:37 AM  Visual Inspection See comments: Yes Sensation Testing See comments: Yes Pulse Check Posterior Tibialis and Dorsalis pulse intact bilaterally: Yes Comments Decreased sensation to 10 g monofilament bilat. R transmetatarsal amputation.    ASSESSMENT: 1. DM2, insulin -dependent, uncontrolled, with complications - CAD, s/p AMI - ICM, with CHF, s/p ICD - Persistent A-fib - CKD - PN - Transmetatarsal amputation  2. HL  PLAN:   1. Patient with longstanding, uncontrolled, type 2 diabetes, on injectable antidiabetic regimen with daily GLP-1 receptor agonist (moved before breakfast at last visit), and also basal-bolus insulin , adjusted at last visit, with fair control.  At last visit, HbA1c was lower, at 6.3%.  2 weeks after last visit he had  another HbA1c which was still at goal, at 6.4%. - At last visit, sugars were improved, mostly fluctuating within the target range with only occasional higher blood sugars after meals and also rare instances of borderline or slightly low blood sugars, especially after breakfast.  Upon questioning, he was not wearing the doses of Humalog  insulin  as recommended previously, as he was forgetting to do so.  I discussed again with him and his wife about how to vary the doses.  She was wondering whether he could use an insulin  to carb ratio but I recommended against this due to the complexities of counting the carbs.  I did not recommend a sliding scale for the same reason.  I can advised him that if he had to take Humalog  after a meal, to only take half of the dose to avoid low blood sugars after meals.  He forgot about this.  I believe that this was the reason for low blood sugars later after meals at last visit.  I also advised him to only take Victoza in the morning as he was taking this either in the morning or at night.  We otherwise did not change  the regimen. CGM interpretation: -At today's visit, we reviewed his CGM downloads: It appears that 90% of values are in target range (goal >70%), while 10% are higher than 180 (goal <25%), and 0% are lower than 70 (goal <4%).  The calculated average blood sugar is 140.  The projected HbA1c for the next 3 months (GMI) is 6.5%. -Reviewing the CGM trends, sugars appear to still be fluctuating within the target range, but we have loss of signal in the evening.  He does have occasional hyperglycemic spikes, but these are not consistent.  For now, we discussed about varying the dose of Humalog  and not taking the same dose before every meal, but otherwise I did not suggest to change her regimen. - I suggested to:  Patient Instructions  Please continue: - Victoza 1.8 mg before breakfast - Lantus  14 units in am - Humalog  (15 min before meals): 10-12 units before meals If  you take Humalog  after a meal, may need to reduce the dose to 5-6 units.  Please return in 4 months.  - we checked his HbA1c: 6.6% (slightly higher, but still at goal) - advised to check sugars at different times of the day - 4x a day, rotating check times - advised for yearly eye exams >> he is UTD - will check an ACR today - return to clinic in 4 months  2. HL -Latest lipid panel was reviewed from 12/2022: LDL at goal, HDL low, triglycerides high: -He is on Crestor  20 mg daily without side effects.  Orders Placed This Encounter  Procedures   Microalbumin / creatinine urine ratio   POCT glycosylated hemoglobin (Hb A1C)   Lela Fendt, MD PhD [facility] Endocrinology

## 2023-10-02 NOTE — Addendum Note (Signed)
 Addended by: TRIXIE FILE on: 10/02/2023 11:56 AM   Modules accepted: Orders

## 2023-10-02 NOTE — Patient Instructions (Signed)
 Please continue: - Victoza 1.8 mg before breakfast - Lantus 14 units in am - Humalog (15 min before meals): 10-12 units before meals If you take Humalog after a meal, may need to reduce the dose to 5-6 units.  Please return in 4 months.

## 2023-10-03 ENCOUNTER — Ambulatory Visit: Payer: Self-pay | Admitting: Internal Medicine

## 2023-10-03 LAB — MICROALBUMIN / CREATININE URINE RATIO
Creatinine, Urine: 27 mg/dL (ref 20–320)
Microalb Creat Ratio: 33 mg/g{creat} — ABNORMAL HIGH (ref <30)
Microalb, Ur: 0.9 mg/dL

## 2023-10-06 DIAGNOSIS — I252 Old myocardial infarction: Secondary | ICD-10-CM | POA: Diagnosis not present

## 2023-10-06 DIAGNOSIS — Z7984 Long term (current) use of oral hypoglycemic drugs: Secondary | ICD-10-CM | POA: Diagnosis not present

## 2023-10-06 DIAGNOSIS — E119 Type 2 diabetes mellitus without complications: Secondary | ICD-10-CM | POA: Diagnosis not present

## 2023-10-06 DIAGNOSIS — I255 Ischemic cardiomyopathy: Secondary | ICD-10-CM | POA: Diagnosis not present

## 2023-10-06 DIAGNOSIS — E785 Hyperlipidemia, unspecified: Secondary | ICD-10-CM | POA: Diagnosis not present

## 2023-10-06 DIAGNOSIS — I5042 Chronic combined systolic (congestive) and diastolic (congestive) heart failure: Secondary | ICD-10-CM | POA: Diagnosis not present

## 2023-10-06 DIAGNOSIS — Z955 Presence of coronary angioplasty implant and graft: Secondary | ICD-10-CM | POA: Diagnosis not present

## 2023-10-06 DIAGNOSIS — Z95810 Presence of automatic (implantable) cardiac defibrillator: Secondary | ICD-10-CM | POA: Diagnosis not present

## 2023-10-06 DIAGNOSIS — R9431 Abnormal electrocardiogram [ECG] [EKG]: Secondary | ICD-10-CM | POA: Diagnosis not present

## 2023-10-06 DIAGNOSIS — I4819 Other persistent atrial fibrillation: Secondary | ICD-10-CM | POA: Diagnosis not present

## 2023-10-06 DIAGNOSIS — Z7982 Long term (current) use of aspirin: Secondary | ICD-10-CM | POA: Diagnosis not present

## 2023-10-06 DIAGNOSIS — Z4502 Encounter for adjustment and management of automatic implantable cardiac defibrillator: Secondary | ICD-10-CM | POA: Diagnosis not present

## 2023-10-06 DIAGNOSIS — Z7901 Long term (current) use of anticoagulants: Secondary | ICD-10-CM | POA: Diagnosis not present

## 2023-10-06 DIAGNOSIS — I472 Ventricular tachycardia, unspecified: Secondary | ICD-10-CM | POA: Diagnosis not present

## 2023-10-06 DIAGNOSIS — I480 Paroxysmal atrial fibrillation: Secondary | ICD-10-CM | POA: Diagnosis not present

## 2023-10-06 DIAGNOSIS — Z8674 Personal history of sudden cardiac arrest: Secondary | ICD-10-CM | POA: Diagnosis not present

## 2023-10-06 DIAGNOSIS — Z794 Long term (current) use of insulin: Secondary | ICD-10-CM | POA: Diagnosis not present

## 2023-10-06 DIAGNOSIS — Z79899 Other long term (current) drug therapy: Secondary | ICD-10-CM | POA: Diagnosis not present

## 2023-10-06 DIAGNOSIS — Z7985 Long-term (current) use of injectable non-insulin antidiabetic drugs: Secondary | ICD-10-CM | POA: Diagnosis not present

## 2023-10-06 DIAGNOSIS — I2510 Atherosclerotic heart disease of native coronary artery without angina pectoris: Secondary | ICD-10-CM | POA: Diagnosis not present

## 2023-10-06 DIAGNOSIS — I5022 Chronic systolic (congestive) heart failure: Secondary | ICD-10-CM | POA: Diagnosis not present

## 2023-10-06 DIAGNOSIS — Z882 Allergy status to sulfonamides status: Secondary | ICD-10-CM | POA: Diagnosis not present

## 2023-10-06 DIAGNOSIS — I110 Hypertensive heart disease with heart failure: Secondary | ICD-10-CM | POA: Diagnosis not present

## 2023-10-06 DIAGNOSIS — I4901 Ventricular fibrillation: Secondary | ICD-10-CM | POA: Diagnosis not present

## 2023-10-13 ENCOUNTER — Other Ambulatory Visit: Payer: Self-pay

## 2023-10-13 MED ORDER — FREESTYLE LIBRE 2 PLUS SENSOR MISC: 1 refills | Status: DC

## 2023-10-16 ENCOUNTER — Other Ambulatory Visit: Payer: Self-pay

## 2023-10-16 MED ORDER — FREESTYLE LIBRE 2 PLUS SENSOR MISC: 1 refills | Status: DC

## 2023-10-16 NOTE — Telephone Encounter (Signed)
 Sensors sent to [facility] order per [employer] we have to send prescription there and then they will process at the pharmacy.

## 2023-10-23 ENCOUNTER — Encounter: Payer: Self-pay | Admitting: Internal Medicine

## 2023-10-23 ENCOUNTER — Ambulatory Visit: Payer: Self-pay

## 2023-10-23 ENCOUNTER — Ambulatory Visit: Admitting: Internal Medicine

## 2023-10-23 VITALS — BP 94/74 | HR 75 | Temp 97.9°F | Ht 70.5 in | Wt 185.8 lb

## 2023-10-23 DIAGNOSIS — E559 Vitamin D deficiency, unspecified: Secondary | ICD-10-CM

## 2023-10-23 DIAGNOSIS — R21 Rash and other nonspecific skin eruption: Secondary | ICD-10-CM

## 2023-10-23 DIAGNOSIS — Z0001 Encounter for general adult medical examination with abnormal findings: Secondary | ICD-10-CM

## 2023-10-23 DIAGNOSIS — Z125 Encounter for screening for malignant neoplasm of prostate: Secondary | ICD-10-CM

## 2023-10-23 DIAGNOSIS — I10 Essential (primary) hypertension: Secondary | ICD-10-CM | POA: Diagnosis not present

## 2023-10-23 DIAGNOSIS — E785 Hyperlipidemia, unspecified: Secondary | ICD-10-CM | POA: Diagnosis not present

## 2023-10-23 DIAGNOSIS — I5022 Chronic systolic (congestive) heart failure: Secondary | ICD-10-CM | POA: Diagnosis not present

## 2023-10-23 DIAGNOSIS — Z0000 Encounter for general adult medical examination without abnormal findings: Secondary | ICD-10-CM | POA: Diagnosis not present

## 2023-10-23 DIAGNOSIS — E1165 Type 2 diabetes mellitus with hyperglycemia: Secondary | ICD-10-CM

## 2023-10-23 DIAGNOSIS — E1159 Type 2 diabetes mellitus with other circulatory complications: Secondary | ICD-10-CM

## 2023-10-23 MED ORDER — KETOCONAZOLE 2 % EX CREA: 1.0000 | TOPICAL_CREAM | Freq: Every day | CUTANEOUS | 2 refills | Status: DC

## 2023-10-23 MED ORDER — FLUCONAZOLE 100 MG PO TABS: 100.0000 mg | ORAL_TABLET | Freq: Every day | ORAL | 0 refills | Status: DC

## 2023-10-23 NOTE — Assessment & Plan Note (Signed)
 BP Readings from Last 3 Encounters:  10/23/23 94/74  10/02/23 90/60  06/22/23 122/64   Stable, pt to continue medical treatment toprol  xl 200 qd

## 2023-10-23 NOTE — Assessment & Plan Note (Signed)
 Stable volume, continue current med tx

## 2023-10-23 NOTE — Assessment & Plan Note (Signed)
 Last vitamin D  Lab Results  Component Value Date   VD25OH 27.76 (L) 08/24/2022   Low, to start oral replacement

## 2023-10-23 NOTE — Patient Instructions (Addendum)
 Please take all new medication as prescribed - the pill for fungal skin infection, and topical cream as well  Please continue all other medications as before, and refills have been done if requested.  Please have the pharmacy call with any other refills you may need.  Please continue your efforts at being more active, low cholesterol diet, and weight control.  You are otherwise up to date with prevention measures today.  Please keep your appointments with your specialists as you may have planned  Please go to the LAB at the blood drawing area for the tests to be done  You will be contacted by phone if any changes need to be made immediately.  Otherwise, you will receive a letter about your results with an explanation, but please check with MyChart first.  Please make an Appointment to return for your 6 month visit, or sooner if needed

## 2023-10-23 NOTE — Assessment & Plan Note (Signed)
 Lab Results  Component Value Date   HGBA1C 6.6 (A) 10/02/2023   Stable, pt to continue current medical treatment jardiance  25 every day, humalog  SSI, victoza 1.8 every day, lantus  14 u qd

## 2023-10-23 NOTE — Assessment & Plan Note (Signed)
 C/w intertrigo - for diflucan  100 mg every day x 7d, ketoconozole cr prn

## 2023-10-23 NOTE — Progress Notes (Signed)
 Patient ID: [name], male   DOB: 1946-02-20, 78 y.o.   MRN: [id number]         Chief Complaint:: wellness exam and Rash (Rash present on genitalia and rectum. Patient notes this has been present since January of this year)  , chf, htn, hld, dm       HPI:  [name] is a 78 y.o. male here for wellness exam; for flu shot and tdap at pharmacy, o/w up to date                        Also has itchy red rash to bilateral groin, perineal and perianal x 8 months.  Pt denies chest pain, increased sob or doe, wheezing, orthopnea, PND, increased LE swelling, palpitations, dizziness or syncope.   Pt denies polydipsia, polyuria, or new focal neuro s/s.    Pt denies fever, wt loss, night sweats, loss of appetite, or other constitutional symptoms     Wt Readings from Last 3 Encounters:  10/23/23 185 lb 12.8 oz (84.3 kg)  10/02/23 190 lb (86.2 kg)  06/22/23 185 lb (83.9 kg)   BP Readings from Last 3 Encounters:  10/23/23 94/74  10/02/23 90/60  06/22/23 122/64   Immunization History  Administered Date(s) Administered   INFLUENZA, HIGH DOSE SEASONAL PF 12/26/2014, 12/05/2016, 11/30/2017, 12/28/2022   Influenza Whole 12/02/2008, 03/02/2012   Influenza,inj,Quad PF,6+ Mos 12/18/2013, 12/02/2019   Influenza,inj,quad, With Preservative 11/29/2018   Influenza-Unspecified 11/28/2012, 11/30/2017   Moderna Sars-Covid-2 Vaccination 04/29/2019, 05/27/2019   PFIZER Comirnaty(Gray Top)Covid-19 Tri-Sucrose Vaccine 12/27/2021   Pfizer(Comirnaty)Fall Seasonal Vaccine 12 years and older 12/12/2022   Pneumococcal Conjugate-13 02/04/2014   Pneumococcal Polysaccharide-23 08/09/2021   Zoster Recombinant(Shingrix ) 03/03/2017, 07/31/2017   Health Maintenance Due  Topic Date Due   DTaP/Tdap/Td (1 - Tdap) Never done   INFLUENZA VACCINE  09/29/2023      Past Medical History:  Diagnosis Date   CALLUS, RIGHT FOOT 12/08/2009   Cardiac arrest (HCC)    Chronic systolic heart failure (HCC) 11/03/2009   DIABETES  MELLITUS, TYPE II 12/05/2006   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/2009   HYPERLIPIDEMIA 11/25/2006   HYPOGONADISM, MALE 12/05/2006   Implantable cardiac defibrillator MDT 09/22/2008   Change out feb 2011, 2015   Ischemic cardiomyopathy 09/22/2008   DES CX and RCA  70% residual LAD  Done in [place]; EF 30%;; myoviewq 25-Nov-2006 no ischemia   Kidney stones    Long term (current) use of anticoagulants 04/25/2010   Myocardial infarction (HCC) 11/24/2004   I died and they brought me back   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   NEPHROLITHIASIS, HX OF 02/25/2008   Persistent atrial fibrillation (HCC) 11/25/2006   Inapp shock 11-25-10 AF VR >220 recurrent   Past Surgical History:  Procedure Laterality Date   CARDIAC CATHETERIZATION     feburary 2012/11/24   CARDIAC DEFIBRILLATOR PLACEMENT     Guidant Vitality [name]   CARDIOVERSION N/A 04/25/2012   Procedure: CARDIOVERSION;  Surgeon: Debby Gallop, MD;  Location: [facility] ENDOSCOPY;  Service: Cardiovascular;  Laterality: N/A;   CARDIOVERSION N/A 05/21/2012   Procedure: CARDIOVERSION;  Surgeon: Elspeth JAYSON Sage, MD;  Location: [facility] ENDOSCOPY;  Service: Cardiovascular;  Laterality: N/A;   CATARACT EXTRACTION     EYE SURGERY     cataracts   IMPLANTABLE CARDIOVERTER DEFIBRILLATOR (ICD) GENERATOR CHANGE N/A 02/05/2014   Procedure: ICD GENERATOR CHANGE;  Surgeon: Elspeth JAYSON Sage, MD;  Location: [facility] CATH LAB;  Service: Cardiovascular;  Laterality: N/A;   PTCA     TONSILLECTOMY     TONSILLECTOMY     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 He has never been exposed to tobacco smoke. He has never used smokeless tobacco. He reports current alcohol use. He reports that he does not use drugs. family history includes Breast cancer in his mother; Diabetes in his maternal grandmother; Hyperlipidemia in  his maternal grandmother. Allergies  Allergen Reactions   Salmon [Fish Allergy] Other (See Comments)    Only canned/ not fresh (probably preservatives)   Sulfa  Antibiotics Other (See Comments)    Not sure he has an allergy to this (??)   Metformin  And Related Diarrhea   Current Outpatient Medications on File Prior to Visit  Medication Sig Dispense Refill   aspirin  EC 81 MG tablet Take 81 mg by mouth daily. Swallow whole.     Cholecalciferol (VITAMIN D3 PO) Take 1 capsule by mouth daily.     citalopram  (CELEXA ) 40 MG tablet Take 40 mg by mouth daily.     Continuous Glucose Receiver (FREESTYLE LIBRE 2 READER) DEVI Use as instructure to check blood sugar 1 each 0   Continuous Glucose Sensor (FREESTYLE LIBRE 2 PLUS SENSOR) MISC Change sensor every 15 days. 6 each 1   Continuous Glucose Sensor (FREESTYLE LIBRE 2 SENSOR) MISC Apply new sensor every 14 days 6 each 3   Cyanocobalamin  (VITAMIN B12 PO) Take 1 tablet by mouth daily.     dofetilide  (TIKOSYN ) 250 MCG capsule Take 1 capsule (250 mcg total) by mouth every 12 (twelve) hours.     empagliflozin  (JARDIANCE ) 25 MG TABS tablet Take 25 mg by mouth daily.     eplerenone (INSPRA) 50 MG tablet Take 50 mg by mouth in the morning.     FLUoxetine  (PROZAC ) 10 MG capsule TAKE 1 CAPSULE BY MOUTH EVERY DAY 90 capsule 3   fluticasone  (FLONASE ) 50 MCG/ACT nasal spray USE 2 SPRAYS IN EACH NOSTRIL EVERY DAY (Patient taking differently: Place 2 sprays into both nostrils daily as needed for allergies or rhinitis.) 16 g 5   insulin  lispro (HUMALOG  KWIKPEN) 100 UNIT/ML KwikPen INJECT 10-14 units under skin before meals, 3 times a day, as advised (Patient taking differently: INJECT 10-14 units under skin before meals, 3 times a day, as advised) 15 mL 1   LANTUS  SOLOSTAR 100 UNIT/ML Solostar Pen INJECT 14 UNITS UNDER THE SKIN DAILY 15 mL 11   liraglutide (VICTOZA) 18 MG/3ML SOPN Inject 1.8 mg into the skin in the morning.     LORazepam  (ATIVAN ) 0.5 MG tablet Take 1  tablet (0.5 mg total) by mouth 2 (two) times daily as needed for anxiety. 10 tablet 0   Magnesium  Oxide (MAG-OX 400 PO) Take 400 mg by mouth 2 (two) times daily.     metoprolol  (TOPROL -XL) 200 MG 24 hr tablet Take 200 mg by mouth daily after supper.     Multiple Vitamins-Minerals (CENTRUM SILVER 50+MEN) TABS Take 1 tablet by mouth daily with breakfast.     NITROSTAT  0.4 MG SL tablet Place 1 tablet (0.4 mg total) under the tongue every 5 (five) minutes as needed for chest pain. 25 tablet 5   Pyridoxine HCl (VITAMIN B6 PO) Take 1 tablet by mouth daily.     rosuvastatin  (CRESTOR ) 20 MG tablet TAKE 1 TABLET BY MOUTH EVERY  DAY 90 tablet 0   sacubitril -valsartan  (ENTRESTO ) 24-26 MG Take 1 tablet by mouth 2 (two) times daily. Please hold for SBP < 100     torsemide  (DEMADEX ) 10 MG tablet Take 1 tablet (10 mg total) by mouth daily.     ULTICARE MICRO PEN NEEDLES 32G X 4 MM MISC USE DAILY AS DIRECTED 100 each 3   XARELTO  20 MG TABS tablet TAKE 1 TABLET BY MOUTH EVERY DAY 30 tablet 5   zolpidem  (AMBIEN ) 5 MG tablet TAKE 1 OR 2 TABLETS BY MOUTH NIGHTLY AT BEDTIME AS NEEDED (Patient taking differently: Take 5 mg by mouth at bedtime.) 30 tablet 0   glucose blood (ONE TOUCH ULTRA TEST) test strip 1 each by Other route 2 (two) times daily. (Patient not taking: Reported on 10/23/2023) 200 each 2   Lancets MISC Use as directed up to 4 times per day (Patient not taking: Reported on 10/23/2023) 100 each 11   No current facility-administered medications on file prior to visit.        ROS:  All others reviewed and negative.  Objective        PE:  BP 94/74   Pulse 75   Temp 97.9 F (36.6 C)   Ht 5' 10.5 (1.791 m)   Wt 185 lb 12.8 oz (84.3 kg)   SpO2 94%   BMI 26.28 kg/m                 Constitutional: Pt appears in NAD               HENT: Head: NCAT.                Right Ear: External ear normal.                 Left Ear: External ear normal.                Eyes: . Pupils are equal, round, and reactive  to light. Conjunctivae and EOM are normal               Nose: without d/c or deformity               Neck: Neck supple. Gross normal ROM               Cardiovascular: Normal rate and regular rhythm.                 Pulmonary/Chest: Effort normal and breath sounds without rales or wheezing.                Abd:  Soft, NT, ND, + BS, no organomegaly               Neurological: Pt is alert. At baseline orientation, motor grossly intact               Skin: Skin is warm. LE edema - none, extensive intertrigo to bilateral groin creases, perineum and perianal               Psychiatric: Pt behavior is normal without agitation   Micro: none  Cardiac tracings I have personally interpreted today:  none  Pertinent Radiological findings (summarize): none   Lab Results  Component Value Date   WBC 9.7 03/30/2023   HGB 15.4 03/30/2023   HCT 46.6 03/30/2023   PLT 173 03/30/2023   GLUCOSE 165 (H) 03/31/2023   CHOL 122 08/24/2022   TRIG 270.0 (H) 08/24/2022   HDL 35.60 (  L) 08/24/2022   LDLDIRECT 63.0 08/24/2022   LDLCALC 60 08/09/2021   ALT 58 (H) 03/30/2023   AST 27 03/30/2023   NA 133 (L) 03/31/2023   K 3.7 03/31/2023   CL 92 (L) 03/31/2023   CREATININE 1.06 03/31/2023   BUN 46 (H) 03/31/2023   CO2 30 03/31/2023   TSH 4.71 08/24/2022   PSA 0.49 08/24/2022   INR 2.0 (H) 03/21/2023   HGBA1C 6.6 (A) 10/02/2023   MICROALBUR 0.9 10/02/2023   Assessment/Plan:  [name] is a 78 y.o. White or Caucasian [1] male with  has a past medical history of CALLUS, RIGHT FOOT (12/08/2009), Cardiac arrest [facility]), Chronic systolic heart failure (HCC) (0/04/7986), DIABETES MELLITUS, TYPE II (12/05/2006), DIABETIC PERIPHERAL NEUROPATHY (12/08/2009), HYPERLIPIDEMIA (11/25/2006), HYPOGONADISM, MALE (12/05/2006), Implantable cardiac defibrillator MDT (09/22/2008), Ischemic cardiomyopathy (09/22/2008), Kidney stones, Long term (current) use of anticoagulants (04/25/2010), Myocardial infarction (HCC) (2006), Neoplasm of  uncertain behavior of skin (06/05/2007), NEPHROLITHIASIS, HX OF (02/25/2008), and Persistent atrial fibrillation (HCC) (11/25/2006).  Encounter for well adult exam with abnormal findings Age and sex appropriate education and counseling updated with regular exercise and diet Referrals for preventative services - none needed Immunizations addressed  - for flu shot and tdap at pharmacy Smoking counseling  - none needed Evidence for depression or other mood disorder - none significant Most recent labs reviewed. I have personally reviewed and have noted: 1) the patient's medical and social history 2) The patient's current medications and supplements 3) The patient's height, weight, and BMI have been recorded in the chart   SYSTOLIC HEART FAILURE, CHRONIC Stable volume, continue current med tx  HTN (hypertension) BP Readings from Last 3 Encounters:  10/23/23 94/74  10/02/23 90/60  06/22/23 122/64   Stable, pt to continue medical treatment toprol  xl 200 qd   Hyperlipidemia Lab Results  Component Value Date   LDLCALC 60 08/09/2021   Stable, pt to continue current statin crestor  20 mg every day, declines lab today   Poorly controlled type 2 diabetes mellitus with circulatory disorder (HCC) Lab Results  Component Value Date   HGBA1C 6.6 (A) 10/02/2023   Stable, pt to continue current medical treatment jardiance  25 every day, humalog  SSI, victoza 1.8 every day, lantus  14 u qd   Vitamin D  deficiency Last vitamin D  Lab Results  Component Value Date   VD25OH 27.76 (L) 08/24/2022   Low, to start oral replacement   Rash C/w intertrigo - for diflucan  100 mg every day x 7d, ketoconozole cr prn  Followup: Return in about 6 months (around 04/24/2024).  Lynwood Rush, MD 10/23/2023 6:28 PM [facility]

## 2023-10-23 NOTE — Telephone Encounter (Signed)
 FYI Only or Action Required?: Action required by provider: request for appointment.  Patient was last seen in primary care on 06/22/2023 by Norleen Lynwood ORN, MD.  Called Nurse Triage reporting Rash.  Symptoms began several months ago.  Interventions attempted: Nothing.  Symptoms are: gradually worsening.Gradual spreading.No pain or itching.  Triage Disposition: See PCP When Office is Open (Within 3 Days)  Patient/caregiver understands and will follow disposition?: Yes   Reason for Disposition  [1] Pimples (localized) AND [place] ] no improvement after using Care Advice  Answer Assessment - Initial Assessment Questions 1. APPEARANCE of RASH: What does the rash look like? (e.g., blisters, dry flaky skin, red spots, redness, sores)     Pimples, white 2. LOCATION: Where is the rash located?      Buttocks, rectum 3. NUMBER: How many spots are there?      many 4. SIZE: How big are the spots? (e.g., inches, cm; or compare to size of pinhead, tip of pen, eraser, pea)      Small,  5. ONSET: When did the rash start?      February  6. ITCHING: Does the rash itch? If Yes, ask: How bad is the itch?  (Scale 0-10; or none, mild, moderate, severe)     no 7. PAIN: Does the rash hurt? If Yes, ask: How bad is the pain?  (Scale 0-10; or none, mild, moderate, severe)     no 8. OTHER SYMPTOMS: Do you have any other symptoms? (e.g., fever)     no 9. PREGNANCY: Is there any chance you are pregnant? When was your last menstrual period?     N/a  Protocols used: Rash or Redness - Localized-A-AH

## 2023-10-23 NOTE — Assessment & Plan Note (Signed)
 Age and sex appropriate education and counseling updated with regular exercise and diet Referrals for preventative services - none needed Immunizations addressed  - for flu shot and tdap at pharmacy Smoking counseling  - none needed Evidence for depression or other mood disorder - none significant Most recent labs reviewed. I have personally reviewed and have noted: 1) the patient's medical and social history 2) The patient's current medications and supplements 3) The patient's height, weight, and BMI have been recorded in the chart

## 2023-10-23 NOTE — Assessment & Plan Note (Signed)
 Lab Results  Component Value Date   LDLCALC 60 08/09/2021   Stable, pt to continue current statin crestor  20 mg every day, declines lab today

## 2023-10-24 ENCOUNTER — Ambulatory Visit: Payer: Self-pay | Admitting: Internal Medicine

## 2023-10-24 LAB — PSA: PSA: 0.65 ng/mL (ref 0.10–4.00)

## 2023-10-24 NOTE — Progress Notes (Signed)
 The test results show that your current treatment is OK, as the tests are stable.  Please continue the same plan.  There is no other need for change of treatment or further evaluation based on these results, at this time.  thanks

## 2023-10-31 ENCOUNTER — Other Ambulatory Visit: Payer: Self-pay | Admitting: Internal Medicine

## 2023-10-31 DIAGNOSIS — E0842 Diabetes mellitus due to underlying condition with diabetic polyneuropathy: Secondary | ICD-10-CM

## 2023-11-03 DIAGNOSIS — I5020 Unspecified systolic (congestive) heart failure: Secondary | ICD-10-CM | POA: Diagnosis not present

## 2023-11-06 DIAGNOSIS — I5042 Chronic combined systolic (congestive) and diastolic (congestive) heart failure: Secondary | ICD-10-CM | POA: Diagnosis not present

## 2023-11-06 DIAGNOSIS — Z4502 Encounter for adjustment and management of automatic implantable cardiac defibrillator: Secondary | ICD-10-CM | POA: Diagnosis not present

## 2023-11-07 NOTE — Progress Notes (Signed)
 [facility] Heart Failure Monitoring Device Report  Patient:  [name]. Date:  November 07, 2023  Device:   [employer] - [place] HF cardiologist:   Dr. Neal Clause  [facility] [place] EP cardiologist:   Dr. Shirlyn Adjutant  Other cardiology provider:  as above   PCP:  Norleen Lynwood Fallow, MD   30 day Report: There were no readings that exceeded the thresholds.  Optivol device detected intrathoracic impedance that was at baseline, suggesting optimal fluid status.   Please see full interrogation report under Media tab for this encounter.   I reviewed the device-generated report and agree with findings.   Recommendation: Continue monthly monitoring.   Sarah B Waters, ANP

## 2023-11-11 ENCOUNTER — Emergency Department (HOSPITAL_COMMUNITY)

## 2023-11-11 ENCOUNTER — Inpatient Hospital Stay (HOSPITAL_COMMUNITY)
Admission: EM | Admit: 2023-11-11 | Discharge: 2023-11-13 | DRG: 177 | Disposition: A | Discharge status: Home / Self Care | Attending: Family Medicine | Admitting: Family Medicine

## 2023-11-11 ENCOUNTER — Other Ambulatory Visit: Payer: Self-pay

## 2023-11-11 ENCOUNTER — Encounter (HOSPITAL_COMMUNITY): Payer: Self-pay

## 2023-11-11 DIAGNOSIS — W19XXXA Unspecified fall, initial encounter: Secondary | ICD-10-CM | POA: Diagnosis not present

## 2023-11-11 DIAGNOSIS — I110 Hypertensive heart disease with heart failure: Secondary | ICD-10-CM | POA: Diagnosis not present

## 2023-11-11 DIAGNOSIS — I255 Ischemic cardiomyopathy: Secondary | ICD-10-CM | POA: Diagnosis present

## 2023-11-11 DIAGNOSIS — I517 Cardiomegaly: Secondary | ICD-10-CM | POA: Diagnosis not present

## 2023-11-11 DIAGNOSIS — R0989 Other specified symptoms and signs involving the circulatory and respiratory systems: Secondary | ICD-10-CM | POA: Diagnosis not present

## 2023-11-11 DIAGNOSIS — Z7982 Long term (current) use of aspirin: Secondary | ICD-10-CM | POA: Diagnosis not present

## 2023-11-11 DIAGNOSIS — I252 Old myocardial infarction: Secondary | ICD-10-CM | POA: Diagnosis not present

## 2023-11-11 DIAGNOSIS — Z95810 Presence of automatic (implantable) cardiac defibrillator: Secondary | ICD-10-CM

## 2023-11-11 DIAGNOSIS — E1142 Type 2 diabetes mellitus with diabetic polyneuropathy: Secondary | ICD-10-CM | POA: Diagnosis present

## 2023-11-11 DIAGNOSIS — R531 Weakness: Secondary | ICD-10-CM | POA: Diagnosis not present

## 2023-11-11 DIAGNOSIS — Z91013 Allergy to seafood: Secondary | ICD-10-CM

## 2023-11-11 DIAGNOSIS — E1110 Type 2 diabetes mellitus with ketoacidosis without coma: Secondary | ICD-10-CM | POA: Diagnosis not present

## 2023-11-11 DIAGNOSIS — Z888 Allergy status to other drugs, medicaments and biological substances status: Secondary | ICD-10-CM

## 2023-11-11 DIAGNOSIS — Z882 Allergy status to sulfonamides status: Secondary | ICD-10-CM | POA: Diagnosis not present

## 2023-11-11 DIAGNOSIS — E785 Hyperlipidemia, unspecified: Secondary | ICD-10-CM | POA: Diagnosis not present

## 2023-11-11 DIAGNOSIS — Z7984 Long term (current) use of oral hypoglycemic drugs: Secondary | ICD-10-CM

## 2023-11-11 DIAGNOSIS — I480 Paroxysmal atrial fibrillation: Secondary | ICD-10-CM | POA: Diagnosis not present

## 2023-11-11 DIAGNOSIS — U071 COVID-19: Principal | ICD-10-CM | POA: Diagnosis present

## 2023-11-11 DIAGNOSIS — J9601 Acute respiratory failure with hypoxia: Secondary | ICD-10-CM

## 2023-11-11 DIAGNOSIS — I6523 Occlusion and stenosis of bilateral carotid arteries: Secondary | ICD-10-CM | POA: Diagnosis not present

## 2023-11-11 DIAGNOSIS — I959 Hypotension, unspecified: Secondary | ICD-10-CM | POA: Diagnosis present

## 2023-11-11 DIAGNOSIS — Z79899 Other long term (current) drug therapy: Secondary | ICD-10-CM

## 2023-11-11 DIAGNOSIS — Z7901 Long term (current) use of anticoagulants: Secondary | ICD-10-CM

## 2023-11-11 DIAGNOSIS — D696 Thrombocytopenia, unspecified: Secondary | ICD-10-CM | POA: Diagnosis present

## 2023-11-11 DIAGNOSIS — I509 Heart failure, unspecified: Secondary | ICD-10-CM | POA: Diagnosis not present

## 2023-11-11 DIAGNOSIS — I5042 Chronic combined systolic (congestive) and diastolic (congestive) heart failure: Secondary | ICD-10-CM | POA: Diagnosis present

## 2023-11-11 DIAGNOSIS — Z794 Long term (current) use of insulin: Secondary | ICD-10-CM

## 2023-11-11 DIAGNOSIS — I4819 Other persistent atrial fibrillation: Secondary | ICD-10-CM | POA: Diagnosis not present

## 2023-11-11 DIAGNOSIS — Z8674 Personal history of sudden cardiac arrest: Secondary | ICD-10-CM

## 2023-11-11 DIAGNOSIS — E0910 Drug or chemical induced diabetes mellitus with ketoacidosis without coma: Secondary | ICD-10-CM | POA: Diagnosis not present

## 2023-11-11 DIAGNOSIS — Z833 Family history of diabetes mellitus: Secondary | ICD-10-CM | POA: Diagnosis not present

## 2023-11-11 DIAGNOSIS — Z7985 Long-term (current) use of injectable non-insulin antidiabetic drugs: Secondary | ICD-10-CM

## 2023-11-11 DIAGNOSIS — R4182 Altered mental status, unspecified: Secondary | ICD-10-CM | POA: Diagnosis not present

## 2023-11-11 LAB — CBC WITH DIFFERENTIAL/PLATELET
Abs Immature Granulocytes: 0.04 K/uL (ref 0.00–0.07)
Basophils Absolute: 0 K/uL (ref 0.0–0.1)
Basophils Relative: 0 %
Eosinophils Absolute: 0 K/uL (ref 0.0–0.5)
Eosinophils Relative: 0 %
HCT: 42.9 % (ref 39.0–52.0)
Hemoglobin: 14.2 g/dL (ref 13.0–17.0)
Immature Granulocytes: 1 %
Lymphocytes Relative: 12 %
Lymphs Abs: 0.8 K/uL (ref 0.7–4.0)
MCH: 29.6 pg (ref 26.0–34.0)
MCHC: 33.1 g/dL (ref 30.0–36.0)
MCV: 89.6 fL (ref 80.0–100.0)
Monocytes Absolute: 1.4 K/uL — ABNORMAL HIGH (ref 0.1–1.0)
Monocytes Relative: 19 %
Neutro Abs: 4.9 K/uL (ref 1.7–7.7)
Neutrophils Relative %: 68 %
Platelets: 112 K/uL — ABNORMAL LOW (ref 150–400)
RBC: 4.79 MIL/uL (ref 4.22–5.81)
RDW: 15.1 % (ref 11.5–15.5)
WBC: 7.2 K/uL (ref 4.0–10.5)
nRBC: 0 % (ref 0.0–0.2)

## 2023-11-11 LAB — I-STAT CHEM 8, ED
BUN: 25 mg/dL — ABNORMAL HIGH (ref 8–23)
Calcium, Ion: 1.12 mmol/L — ABNORMAL LOW (ref 1.15–1.40)
Chloride: 98 mmol/L (ref 98–111)
Creatinine, Ser: 1.3 mg/dL — ABNORMAL HIGH (ref 0.61–1.24)
Glucose, Bld: 179 mg/dL — ABNORMAL HIGH (ref 70–99)
HCT: 43 % (ref 39.0–52.0)
Hemoglobin: 14.6 g/dL (ref 13.0–17.0)
Potassium: 4.5 mmol/L (ref 3.5–5.1)
Sodium: 133 mmol/L — ABNORMAL LOW (ref 135–145)
TCO2: 21 mmol/L — ABNORMAL LOW (ref 22–32)

## 2023-11-11 LAB — TROPONIN T, HIGH SENSITIVITY
Troponin T High Sensitivity: 24 ng/L — ABNORMAL HIGH (ref 0–19)
Troponin T High Sensitivity: 21 ng/L — ABNORMAL HIGH (ref 0–19)

## 2023-11-11 LAB — COMPREHENSIVE METABOLIC PANEL WITH GFR
ALT: 20 U/L (ref 0–44)
AST: 30 U/L (ref 15–41)
Albumin: 4.1 g/dL (ref 3.5–5.0)
Alkaline Phosphatase: 79 U/L (ref 38–126)
Anion gap: 16 — ABNORMAL HIGH (ref 5–15)
BUN: 24 mg/dL — ABNORMAL HIGH (ref 8–23)
CO2: 21 mmol/L — ABNORMAL LOW (ref 22–32)
Calcium: 9 mg/dL (ref 8.9–10.3)
Chloride: 96 mmol/L — ABNORMAL LOW (ref 98–111)
Creatinine, Ser: 1.26 mg/dL — ABNORMAL HIGH (ref 0.61–1.24)
GFR, Estimated: 59 mL/min — ABNORMAL LOW (ref >60)
Glucose, Bld: 176 mg/dL — ABNORMAL HIGH (ref 70–99)
Potassium: 4.6 mmol/L (ref 3.5–5.1)
Sodium: 133 mmol/L — ABNORMAL LOW (ref 135–145)
Total Bilirubin: 0.5 mg/dL (ref 0.0–1.2)
Total Protein: 6.9 g/dL (ref 6.5–8.1)

## 2023-11-11 LAB — CK: Total CK: 43 U/L — ABNORMAL LOW (ref 49–397)

## 2023-11-11 LAB — CBG MONITORING, ED: Glucose-Capillary: 214 mg/dL — ABNORMAL HIGH (ref 70–99)

## 2023-11-11 LAB — GLUCOSE, CAPILLARY
Glucose-Capillary: 141 mg/dL — ABNORMAL HIGH (ref 70–99)
Glucose-Capillary: 249 mg/dL — ABNORMAL HIGH (ref 70–99)

## 2023-11-11 LAB — I-STAT CG4 LACTIC ACID, ED
Lactic Acid, Venous: 1.5 mmol/L (ref 0.5–1.9)
Lactic Acid, Venous: 1.4 mmol/L (ref 0.5–1.9)

## 2023-11-11 LAB — RESP PANEL BY RT-PCR (RSV, FLU A&B, COVID)  RVPGX2
Influenza A by PCR: NEGATIVE
Influenza B by PCR: NEGATIVE
Resp Syncytial Virus by PCR: NEGATIVE
SARS Coronavirus 2 by RT PCR: POSITIVE — AB

## 2023-11-11 LAB — PRO BRAIN NATRIURETIC PEPTIDE: Pro Brain Natriuretic Peptide: 4388 pg/mL — ABNORMAL HIGH (ref <300.0)

## 2023-11-11 MED ORDER — ACETAMINOPHEN 325 MG PO TABS: 650.0000 mg | ORAL_TABLET | Freq: Four times a day (QID) | ORAL | Status: DC | PRN

## 2023-11-11 MED ORDER — SODIUM CHLORIDE 0.9 % IV SOLN
100.0000 mg | INTRAVENOUS | Status: AC
  Administered 2023-11-11 (×2): 100 mg via INTRAVENOUS
  Filled 2023-11-11 (×2): qty 20

## 2023-11-11 MED ORDER — SODIUM CHLORIDE 0.9 % IV SOLN: 100.0000 mg | Freq: Every day | INTRAVENOUS | Status: DC

## 2023-11-11 MED ORDER — CITALOPRAM HYDROBROMIDE 20 MG PO TABS
40.0000 mg | ORAL_TABLET | Freq: Every day | ORAL | Status: DC
  Administered 2023-11-11 – 2023-11-13 (×3): 40 mg via ORAL
  Filled 2023-11-11 (×3): qty 2

## 2023-11-11 MED ORDER — FLUOXETINE HCL 10 MG PO CAPS
10.0000 mg | ORAL_CAPSULE | Freq: Every day | ORAL | Status: DC
  Administered 2023-11-11 – 2023-11-13 (×3): 10 mg via ORAL
  Filled 2023-11-11 (×3): qty 1

## 2023-11-11 MED ORDER — ZOLPIDEM TARTRATE 5 MG PO TABS
10.0000 mg | ORAL_TABLET | Freq: Every evening | ORAL | Status: DC | PRN
  Administered 2023-11-11 – 2023-11-12 (×2): 10 mg via ORAL
  Filled 2023-11-11 (×2): qty 2

## 2023-11-11 MED ORDER — LORAZEPAM 0.5 MG PO TABS
0.5000 mg | ORAL_TABLET | Freq: Two times a day (BID) | ORAL | Status: DC | PRN
  Administered 2023-11-11 – 2023-11-12 (×2): 0.5 mg via ORAL
  Filled 2023-11-11 (×2): qty 1

## 2023-11-11 MED ORDER — SODIUM CHLORIDE 0.9 % IV SOLN: INTRAVENOUS | Status: AC

## 2023-11-11 MED ORDER — INSULIN GLARGINE 100 UNIT/ML ~~LOC~~ SOLN
14.0000 [IU] | Freq: Every day | SUBCUTANEOUS | Status: DC
  Administered 2023-11-11 – 2023-11-13 (×3): 14 [IU] via SUBCUTANEOUS
  Filled 2023-11-11 (×3): qty 0.14

## 2023-11-11 MED ORDER — LACTATED RINGERS IV BOLUS (SEPSIS)
500.0000 mL | Freq: Once | INTRAVENOUS | Status: AC
  Administered 2023-11-11: 500 mL via INTRAVENOUS

## 2023-11-11 MED ORDER — DEXAMETHASONE 4 MG PO TABS
6.0000 mg | ORAL_TABLET | ORAL | Status: DC
  Administered 2023-11-11 – 2023-11-13 (×3): 6 mg via ORAL
  Filled 2023-11-11 (×3): qty 1

## 2023-11-11 MED ORDER — ALBUTEROL SULFATE (2.5 MG/3ML) 0.083% IN NEBU: 2.5000 mg | INHALATION_SOLUTION | RESPIRATORY_TRACT | Status: DC | PRN

## 2023-11-11 MED ORDER — SODIUM CHLORIDE 0.9 % IV SOLN: 200.0000 mg | Freq: Once | INTRAVENOUS | Status: DC

## 2023-11-11 MED ORDER — ACETAMINOPHEN 650 MG RE SUPP: 650.0000 mg | Freq: Four times a day (QID) | RECTAL | Status: DC | PRN

## 2023-11-11 MED ORDER — ONDANSETRON HCL 4 MG/2ML IJ SOLN: 4.0000 mg | Freq: Four times a day (QID) | INTRAMUSCULAR | Status: DC | PRN

## 2023-11-11 MED ORDER — RIVAROXABAN 20 MG PO TABS
20.0000 mg | ORAL_TABLET | Freq: Every day | ORAL | Status: DC
  Administered 2023-11-11 – 2023-11-13 (×3): 20 mg via ORAL
  Filled 2023-11-11 (×3): qty 1

## 2023-11-11 MED ORDER — ONDANSETRON HCL 4 MG PO TABS: 4.0000 mg | ORAL_TABLET | Freq: Four times a day (QID) | ORAL | Status: DC | PRN

## 2023-11-11 MED ORDER — METOPROLOL SUCCINATE ER 100 MG PO TB24
200.0000 mg | ORAL_TABLET | Freq: Every day | ORAL | Status: DC
  Filled 2023-11-11: qty 2

## 2023-11-11 MED ORDER — INSULIN ASPART 100 UNIT/ML IJ SOLN
0.0000 [IU] | Freq: Three times a day (TID) | INTRAMUSCULAR | Status: DC
  Administered 2023-11-11: 2 [IU] via SUBCUTANEOUS
  Administered 2023-11-12 (×2): 3 [IU] via SUBCUTANEOUS
  Administered 2023-11-12: 5 [IU] via SUBCUTANEOUS
  Administered 2023-11-13: 3 [IU] via SUBCUTANEOUS
  Administered 2023-11-13: 8 [IU] via SUBCUTANEOUS
  Administered 2023-11-13: 3 [IU] via SUBCUTANEOUS
  Filled 2023-11-11: qty 0.15

## 2023-11-11 MED ORDER — DOFETILIDE 250 MCG PO CAPS
250.0000 ug | ORAL_CAPSULE | Freq: Two times a day (BID) | ORAL | Status: DC
  Administered 2023-11-11 – 2023-11-13 (×4): 250 ug via ORAL
  Filled 2023-11-11 (×6): qty 1

## 2023-11-11 MED ORDER — INSULIN ASPART 100 UNIT/ML IJ SOLN
0.0000 [IU] | Freq: Every day | INTRAMUSCULAR | Status: DC
  Administered 2023-11-11: 2 [IU] via SUBCUTANEOUS
  Administered 2023-11-12: 3 [IU] via SUBCUTANEOUS
  Filled 2023-11-11: qty 0.05

## 2023-11-11 NOTE — H&P (Signed)
 History and Physical  [name] FMW:[id number] DOB: Jan 27, 1946 DOA: 11/11/2023  PCP: Norleen [name] ORN, MD   Chief Complaint: Weakness, shortness of breath  HPI: [name] is a 78 y.o. male with medical history significant for insulin -dependent type 2 diabetes, combined systolic and diastolic congestive heart failure, paroxysmal atrial fibrillation on Xarelto  admitted to the hospital with progressive weakness, acute hypoxic respiratory failure in the setting of COVID infection.  History is provided by the patient, who lives with roommates, and whose ex-wife and daughter are closely involved in his care.  Apparently for the last few days, he has been feeling generally weak, he has had a bit of a nonproductive cough and some slight congestion.  He denies any fevers, chest pain, dizziness, or focal neurologic deficits.  This morning, he was trying to get out of bed and found that he was so weak he could not even stand, so he ended up sliding himself onto the floor, says he did not actually fall and did not hit his head.  He was on the floor for about 15 minutes before his daughter arrived at his home and saw him on the ground.  Review of Systems: Please see HPI for pertinent positives and negatives. A complete 10 system review of systems are otherwise negative.  Past Medical History:  Diagnosis Date   CALLUS, RIGHT FOOT 12/08/2009   Cardiac arrest [facility])    Chronic systolic heart failure (HCC) 11/03/2009   DIABETES MELLITUS, TYPE II 12/05/2006   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/2009   HYPERLIPIDEMIA 11/25/2006   HYPOGONADISM, MALE 12/05/2006   Implantable cardiac defibrillator MDT 09/22/2008   Change out feb 2011, 2015   Ischemic cardiomyopathy 09/22/2008   DES CX and RCA  70% residual LAD  Done in [place]; EF 30%;; myoviewq 11/23/06 no ischemia   Kidney stones    Long term (current) use of anticoagulants 04/25/2010   Myocardial infarction (HCC) 2004/11/22   I died and they brought me back   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   NEPHROLITHIASIS, HX OF 02/25/2008   Persistent atrial fibrillation (HCC) 11/25/2006   Inapp shock Nov 23, 2010 AF VR >220 recurrent   Past Surgical History:  Procedure Laterality Date   CARDIAC CATHETERIZATION     feburary 11-22-2012   CARDIAC DEFIBRILLATOR PLACEMENT     Guidant Vitality [name]   CARDIOVERSION N/A 04/25/2012   Procedure: CARDIOVERSION;  Surgeon: Debby Gallop, MD;  Location: [facility] ENDOSCOPY;  Service: Cardiovascular;  Laterality: N/A;   CARDIOVERSION N/A 05/21/2012   Procedure: CARDIOVERSION;  Surgeon: Elspeth JAYSON Sage, MD;  Location: [facility] ENDOSCOPY;  Service: Cardiovascular;  Laterality: N/A;   CATARACT EXTRACTION     EYE SURGERY     cataracts   IMPLANTABLE CARDIOVERTER DEFIBRILLATOR (ICD) GENERATOR CHANGE N/A 02/05/2014   Procedure: ICD GENERATOR CHANGE;  Surgeon: Elspeth JAYSON Sage, MD;  Location: [facility] CATH LAB;  Service: Cardiovascular;  Laterality: N/A;   PTCA     TONSILLECTOMY     TONSILLECTOMY     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 He has never been exposed to tobacco smoke. He has never used smokeless tobacco. He reports current alcohol use. He reports that he does not use drugs.  Allergies  Allergen Reactions   Salmon [Fish Allergy] Other (See Comments)    Only canned/ not  fresh (probably preservatives)   Sulfa  Antibiotics Other (See Comments)    Not sure he has an allergy to this (??)   Metformin  And Related Diarrhea    Family History  Problem Relation Age of Onset   Diabetes Maternal Grandmother    Hyperlipidemia Maternal Grandmother    Breast cancer Mother    Cancer Neg Hx    COPD Neg Hx    Heart disease Neg Hx      Prior to Admission medications   Medication Sig Start Date End Date Taking? Authorizing Provider  aspirin  EC 81 MG  tablet Take 81 mg by mouth daily. Swallow whole.    [provider]  Cholecalciferol (VITAMIN D3 PO) Take 1 capsule by mouth daily.    [provider]  citalopram  (CELEXA ) 40 MG tablet Take 40 mg by mouth daily. 06/18/23   [provider]  Continuous Glucose Receiver (FREESTYLE LIBRE 2 READER) DEVI Use as DIRECTED to check blood sugar 11/01/23   Trixie File, MD  Continuous Glucose Sensor (FREESTYLE LIBRE 2 PLUS SENSOR) MISC Change sensor every 15 days. 10/16/23   Trixie File, MD  Continuous Glucose Sensor (FREESTYLE LIBRE 2 SENSOR) MISC Apply new sensor every 14 days 09/05/22   Trixie File, MD  Cyanocobalamin  (VITAMIN B12 PO) Take 1 tablet by mouth daily.    [provider]  dofetilide  (TIKOSYN ) 250 MCG capsule Take 1 capsule (250 mcg total) by mouth every 12 (twelve) hours. 03/31/23   Gherghe, Costin M, MD  empagliflozin  (JARDIANCE ) 25 MG TABS tablet Take 25 mg by mouth daily.    [provider]  eplerenone (INSPRA) 50 MG tablet Take 50 mg by mouth in the morning.    [provider]  fluconazole  (DIFLUCAN ) 100 MG tablet Take 1 tablet (100 mg total) by mouth daily. 10/23/23   Norleen [name] ORN, MD  FLUoxetine  (PROZAC ) 10 MG capsule TAKE 1 CAPSULE BY MOUTH EVERY DAY 08/25/23   Norleen [name] ORN, MD  fluticasone  (FLONASE ) 50 MCG/ACT nasal spray USE 2 SPRAYS IN EACH NOSTRIL EVERY DAY Patient taking differently: Place 2 sprays into both nostrils daily as needed for allergies or rhinitis. 01/15/18   Norleen [name] ORN, MD  glucose blood (ONE TOUCH ULTRA TEST) test strip 1 each by Other route 2 (two) times daily. Patient not taking: Reported on 10/23/2023 12/20/16   Norleen [name] ORN, MD  insulin  lispro (HUMALOG  [facility]) 100 UNIT/ML KwikPen INJECT 10-14 units under skin before meals, 3 times a day, as advised Patient taking differently: INJECT 10-14 units under skin before meals, 3 times a day, as advised 04/17/23   Trixie File, MD  ketoconazole  (NIZORAL )  2 % cream Apply 1 Application topically daily. 10/23/23   Norleen [name] ORN, MD  Lancets MISC Use as directed up to 4 times per day Patient not taking: Reported on 10/23/2023 02/07/14   Norleen [name] ORN, MD  LANTUS  SOLOSTAR 100 UNIT/ML Solostar Pen INJECT 14 UNITS UNDER THE SKIN DAILY 01/30/23   Trixie File, MD  liraglutide (VICTOZA) 18 MG/3ML SOPN Inject 1.8 mg into the skin in the morning.    [provider]  LORazepam  (ATIVAN ) 0.5 MG tablet Take 1 tablet (0.5 mg total) by mouth 2 (two) times daily as needed for anxiety. 03/31/23   Gherghe, Costin M, MD  Magnesium  Oxide (MAG-OX 400 PO) Take 400 mg by mouth 2 (two) times daily.    [provider]  metoprolol  (TOPROL -XL) 200 MG 24 hr tablet Take 200 mg by mouth daily after  supper. 12/31/19   [provider]  Multiple Vitamins-Minerals (CENTRUM SILVER 50+MEN) TABS Take 1 tablet by mouth daily with breakfast.    [provider]  NITROSTAT  0.4 MG SL tablet Place 1 tablet (0.4 mg total) under the tongue every 5 (five) minutes as needed for chest pain. 10/06/16   Fernande Elspeth BROCKS, MD  Pyridoxine HCl (VITAMIN B6 PO) Take 1 tablet by mouth daily.    [provider]  rosuvastatin  (CRESTOR ) 20 MG tablet TAKE 1 TABLET BY MOUTH EVERY DAY 08/30/18   Norleen [name] ORN, MD  sacubitril -valsartan  (ENTRESTO ) 24-26 MG Take 1 tablet by mouth 2 (two) times daily. Please hold for SBP < 100 03/31/23   Gherghe, Nilda HERO, MD  torsemide  (DEMADEX ) 10 MG tablet Take 1 tablet (10 mg total) by mouth daily. 04/01/23   Trixie Nilda HERO, MD  ULTICARE MICRO PEN NEEDLES 32G X 4 MM MISC USE DAILY AS DIRECTED 07/09/18   Kassie Mallick, MD  XARELTO  20 MG TABS tablet TAKE 1 TABLET BY MOUTH EVERY DAY 04/24/17   Fernande Elspeth BROCKS, MD  zolpidem  (AMBIEN ) 5 MG tablet TAKE 1 OR 2 TABLETS BY MOUTH NIGHTLY AT BEDTIME AS NEEDED Patient taking differently: Take 5 mg by mouth at bedtime. 12/25/18   Norleen [name] ORN, MD    Physical Exam: BP 92/62 (BP Location: Right Arm)    Pulse 75   Temp (!) 97.5 F (36.4 C) (Oral)   Resp (!) 26   SpO2 97%  General:  Alert, oriented, calm, in no acute distress, resting comfortably lying completely flat on 2 L nasal cannula oxygen, saturating 97%.  No cough, speaking in full sentences.  His ex-wife and his daughter are at the bedside. Cardiovascular: RRR, no murmurs or rubs, no peripheral edema  Respiratory: Clear to auscultation bilaterally with good equal bilateral air entry, no wheezing, some crackles at the right base Abdomen: soft, nontender, minimally distended, normal bowel tones heard  Skin: dry, no rashes  Musculoskeletal: no joint effusions, normal range of motion  Psychiatric: appropriate affect, normal speech  Neurologic: extraocular muscles intact, clear speech, moving all extremities with intact sensorium         Labs on Admission:  Basic Metabolic Panel: Recent Labs  Lab 11/11/23 1048 11/11/23 1049  NA 133* 133*  K 4.6 4.5  CL 96* 98  CO2 21*  --   GLUCOSE 176* 179*  BUN 24* 25*  CREATININE 1.26* 1.30*  CALCIUM  9.0  --    Liver Function Tests: Recent Labs  Lab 11/11/23 1048  AST 30  ALT 20  ALKPHOS 79  BILITOT 0.5  PROT 6.9  ALBUMIN 4.1   No results for input(s): LIPASE, AMYLASE in the last 168 hours. No results for input(s): AMMONIA in the last 168 hours. CBC: Recent Labs  Lab 11/11/23 1048 11/11/23 1049  WBC 7.2  --   NEUTROABS 4.9  --   HGB 14.2 14.6  HCT 42.9 43.0  MCV 89.6  --   PLT 112*  --    Cardiac Enzymes: Recent Labs  Lab 11/11/23 1049  CKTOTAL 43*   BNP (last 3 results) Recent Labs    03/24/23 0347  BNP 771.7*    ProBNP (last 3 results) Recent Labs    11/11/23 1049  PROBNP 4,388.0*    CBG: Recent Labs  Lab 11/11/23 0947  GLUCAP 214*    Radiological Exams on Admission: CT Head Wo Contrast Result Date: 11/11/2023 EXAM: CT HEAD WITHOUT CONTRAST 11/11/2023 11:04:06 AM TECHNIQUE:  CT of the head was performed without the administration of  intravenous contrast. Automated exposure control, iterative reconstruction, and/or weight based adjustment of the mA/kV was utilized to reduce the radiation dose to as low as reasonably achievable. COMPARISON: CT head without contrast 03/21/2023. CLINICAL HISTORY: Mental status change, unknown cause. Fall on blood thinners. FINDINGS: BRAIN AND VENTRICLES: No acute hemorrhage. No evidence of acute infarct. No hydrocephalus. No extra-axial collection. No mass effect or midline shift. Atherosclerotic calcifications are present in the cavernous carotid arteries bilaterally. No hyperdense vessel is present. ORBITS: Bilateral lens replacements are noted. The globes and orbits are otherwise within normal limits. SINUSES: No acute abnormality. SOFT TISSUES AND SKULL: No acute soft tissue abnormality. No skull fracture. IMPRESSION: 1. No acute intracranial abnormality related to the clinical history of mental status change and fall on blood thinners. Electronically signed by: Lonni Necessary MD 11/11/2023 11:22 AM EDT RP Workstation: [id number]   DG Chest Port 1 View Result Date: 11/11/2023 EXAM: 1 VIEW XRAY OF THE CHEST 11/11/2023 10:31:00 AM COMPARISON: 03/23/2023 CLINICAL HISTORY: Weakness. Per chart: t brought in by daughter after patient was found down after a mechanical fall. Pt was down about 15 minutes before help arrived. He states that his legs gave out on him while trying to get out of bed. Pt denies syncope or LOC. FINDINGS: LUNGS AND PLEURA: Mild pulmonary vascular congestion similar to prior exam. No focal pulmonary opacity. No pulmonary edema. No pleural effusion. No pneumothorax. HEART AND MEDIASTINUM: Left chest AICD/pacemaker with leads projecting over right atrium, right ventricle, and left ventricle. Cardiomegaly, unchanged. The heart size is upper limits of normal. Aortic calcification. Neutral clip noted. BONES AND SOFT TISSUES: Multilevel thoracic osteophytosis. No acute osseous abnormality.  IMPRESSION: 1. No acute findings. 2. Cardiomegaly, unchanged. 3. Mild pulmonary vascular congestion, similar to prior exam. Electronically signed by: Lonni Necessary MD 11/11/2023 11:20 AM EDT RP Workstation: [id number]   Assessment/Plan [name] is a 78 y.o. male with medical history significant for insulin -dependent type 2 diabetes, combined systolic and diastolic congestive heart failure, paroxysmal atrial fibrillation on Xarelto  admitted to the hospital with progressive weakness, acute hypoxic respiratory failure in the setting of COVID infection.   COVID infection, acute hypoxic respiratory failure-patient presents with several days of weakness, dyspnea with exertion, found to have acute hypoxic respiratory failure.  On room air at baseline.  Found to have COVID infection, no sick contacts. -Inpatient admission -Monitor closely on progressive -Continue supplemental oxygen, wean as tolerated with goal O2 saturation greater than 89% -Due to hypoxia, meets criteria for COVID treatment with IV remdesivir  and p.o. Decadron  -No evidence of superimposed bacterial infection  Hypotension-patient's baseline blood pressure is on the low side, with documented normal systolic blood pressure 90-115.  Here today in the emergency department blood pressure has been going into the 70s and 80s, patient is generally weak but denies dizziness, chest pain or other complication.  He was given 500 cc normal saline in the emergency department. -Hold antihypertensives including Entresto  -Though he may have mild volume overload, will hold off on diuretic for the time being given his hypotension -Will hydrate gently overnight with normal saline  Combined systolic and diastolic congestive heart failure-followed by cardiology [facility]. [facility] at [facility], last echo shows EF 20 to 25%. -Continue metoprolol  -Hold other heart failure medications due to hypotension -If stable overnight with improved blood pressures, could  consider gradual diuresis with IV Lasix  starting tomorrow  Type 2 diabetes-overall well-controlled -Carb modified heart healthy diet -Moderate  dose sliding scale along with home Lantus  once dosing confirmed  Paroxysmal atrial fibrillation-continue Tikosyn , metoprolol  and Xarelto  for stroke prophylaxis.  Monitor with cardiac telemetry.  DVT prophylaxis: Continue Xarelto  for A-fib    Code Status: Full Code  Consults called: None  Admission status: The appropriate patient status for this patient is INPATIENT. Inpatient status is judged to be reasonable and necessary in order to provide the required intensity of service to ensure the patient's safety. The patient's presenting symptoms, physical exam findings, and initial radiographic and laboratory data in the context of their chronic comorbidities is felt to place them at high risk for further clinical deterioration. Furthermore, it is not anticipated that the patient will be medically stable for discharge from the hospital within 2 midnights of admission.    I certify that at the point of admission it is my clinical judgment that the patient will require inpatient hospital care spanning beyond 2 midnights from the point of admission due to high intensity of service, high risk for further deterioration and high frequency of surveillance required  Due to a high probability of clinically significant, life threatening deterioration, the patient required my highest level of preparedness to intervene emergently and I personally spent this critical care time directly and personally managing the patient. This critical care time included obtaining a history; examining the patient; reviewing vitals; ordering and review of studies; arranging urgent treatment with development of a management plan; evaluation of patient's response to treatment; frequent reassessment; and, discussions with other providers as well as available family.  Total critical care time:  Approximately 56 minutes  [name] Malacara CHRISTELLA Gail MD [facility] Pager [phone]  If 7PM-7AM, please contact night-coverage [email or web address] Password [email or web address]  11/11/2023, 2:10 PM

## 2023-11-11 NOTE — ED Provider Notes (Signed)
 [place] EMERGENCY DEPARTMENT AT [facility] Provider Note   CSN: [id number] Arrival date & time: 11/11/23  9060     Patient presents with: Fall (Pt brought in by daughter after patient was found down after a mechanical fall. Pt was down about 15 minutes before help arrived. He states that his legs gave out on him while trying to get out of bed. Pt denies syncope or LOC. Denies injury. Hx - significant cardiac.)   [name] is a 78 y.o. male.   Patient is a 78 year old male with a history of diabetes, CHF, hyperlipidemia, atrial fibrillation on Xarelto , pacemaker/ICD who presents with weakness.  History is obtained from the patient and his family members at bedside.  He states he has been weaker over the last few days.  His family member says he is normally able to walk in the house and out to the car but has not been able to today and has seemed to be weaker over the last few days.  He has had some increased shortness of breath and has had about a 5 pound weight gain over the last few days.  He has had a cough which is nonproductive.  He also has little bit of nasal congestion.  No known fevers.  Has had some nausea but no vomiting or diarrhea.  He has been generally weak.  He has a little bit of slurring of his speech which his family member say he has had before when he has been weak.  No unilateral weakness.  He was trying to get out of bed this morning and was not able to because of the weakness and he slid down to the floor.  He did not have a fall or any injuries related to this.  He says he did not hit his head.  He reportedly laid on the floor for about 15 minutes.       Prior to Admission medications   Medication Sig Start Date End Date Taking? Authorizing Provider  aspirin  EC 81 MG tablet Take 81 mg by mouth daily. Swallow whole.    [provider]  Cholecalciferol (VITAMIN D3 PO) Take 1 capsule by mouth daily.    [provider]  citalopram   (CELEXA ) 40 MG tablet Take 40 mg by mouth daily. 06/18/23   [provider]  Continuous Glucose Receiver (FREESTYLE LIBRE 2 READER) DEVI Use as DIRECTED to check blood sugar 11/01/23   Trixie File, MD  Continuous Glucose Sensor (FREESTYLE LIBRE 2 PLUS SENSOR) MISC Change sensor every 15 days. 10/16/23   Trixie File, MD  Continuous Glucose Sensor (FREESTYLE LIBRE 2 SENSOR) MISC Apply new sensor every 14 days 09/05/22   Trixie File, MD  Cyanocobalamin  (VITAMIN B12 PO) Take 1 tablet by mouth daily.    [provider]  dofetilide  (TIKOSYN ) 250 MCG capsule Take 1 capsule (250 mcg total) by mouth every 12 (twelve) hours. 03/31/23   Gherghe, Costin M, MD  empagliflozin  (JARDIANCE ) 25 MG TABS tablet Take 25 mg by mouth daily.    [provider]  eplerenone (INSPRA) 50 MG tablet Take 50 mg by mouth in the morning.    [provider]  fluconazole  (DIFLUCAN ) 100 MG tablet Take 1 tablet (100 mg total) by mouth daily. 10/23/23   Norleen Lynwood ORN, MD  FLUoxetine  (PROZAC ) 10 MG capsule TAKE 1 CAPSULE BY MOUTH EVERY DAY 08/25/23   Norleen Lynwood ORN, MD  fluticasone  (FLONASE ) 50 MCG/ACT nasal spray USE 2 SPRAYS IN [facility]  NOSTRIL EVERY DAY Patient taking differently: Place 2 sprays into both nostrils daily as needed for allergies or rhinitis. 01/15/18   Norleen Lynwood ORN, MD  glucose blood (ONE TOUCH ULTRA TEST) test strip 1 each by Other route 2 (two) times daily. Patient not taking: Reported on 10/23/2023 12/20/16   Norleen Lynwood ORN, MD  insulin  lispro (HUMALOG  KWIKPEN) 100 UNIT/ML KwikPen INJECT 10-14 units under skin before meals, 3 times a day, as advised Patient taking differently: INJECT 10-14 units under skin before meals, 3 times a day, as advised 04/17/23   Trixie File, MD  ketoconazole  (NIZORAL ) 2 % cream Apply 1 Application topically daily. 10/23/23   Norleen Lynwood ORN, MD  Lancets MISC Use as directed up to 4 times per day Patient not taking: Reported on 10/23/2023 02/07/14    Norleen Lynwood ORN, MD  LANTUS  SOLOSTAR 100 UNIT/ML Solostar Pen INJECT 14 UNITS UNDER THE SKIN DAILY 01/30/23   Trixie File, MD  liraglutide (VICTOZA) 18 MG/3ML SOPN Inject 1.8 mg into the skin in the morning.    [provider]  LORazepam  (ATIVAN ) 0.5 MG tablet Take 1 tablet (0.5 mg total) by mouth 2 (two) times daily as needed for anxiety. 03/31/23   Gherghe, Costin M, MD  Magnesium  Oxide (MAG-OX 400 PO) Take 400 mg by mouth 2 (two) times daily.    [provider]  metoprolol  (TOPROL -XL) 200 MG 24 hr tablet Take 200 mg by mouth daily after supper. 12/31/19   [provider]  Multiple Vitamins-Minerals (CENTRUM SILVER 50+MEN) TABS Take 1 tablet by mouth daily with breakfast.    [provider]  NITROSTAT  0.4 MG SL tablet Place 1 tablet (0.4 mg total) under the tongue every 5 (five) minutes as needed for chest pain. 10/06/16   Fernande Elspeth BROCKS, MD  Pyridoxine HCl (VITAMIN B6 PO) Take 1 tablet by mouth daily.    [provider]  rosuvastatin  (CRESTOR ) 20 MG tablet TAKE 1 TABLET BY MOUTH EVERY DAY 08/30/18   Norleen Lynwood ORN, MD  sacubitril -valsartan  (ENTRESTO ) 24-26 MG Take 1 tablet by mouth 2 (two) times daily. Please hold for SBP < 100 03/31/23   Gherghe, Nilda HERO, MD  torsemide  (DEMADEX ) 10 MG tablet Take 1 tablet (10 mg total) by mouth daily. 04/01/23   Trixie Nilda HERO, MD  ULTICARE MICRO PEN NEEDLES 32G X 4 MM MISC USE DAILY AS DIRECTED 07/09/18   Kassie Mallick, MD  XARELTO  20 MG TABS tablet TAKE 1 TABLET BY MOUTH EVERY DAY 04/24/17   Fernande Elspeth BROCKS, MD  zolpidem  (AMBIEN ) 5 MG tablet TAKE 1 OR 2 TABLETS BY MOUTH NIGHTLY AT BEDTIME AS NEEDED Patient taking differently: Take 5 mg by mouth at bedtime. 12/25/18   Norleen Lynwood ORN, MD    Allergies: Garnell gums allergy], Sulfa  antibiotics, and Metformin  and related    Review of Systems  Constitutional:  Positive for fatigue. Negative for chills, diaphoresis and fever.  HENT:  Positive for congestion and rhinorrhea.  Negative for sneezing.   Eyes: Negative.   Respiratory:  Positive for cough and shortness of breath. Negative for chest tightness.   Cardiovascular:  Negative for chest pain and leg swelling.  Gastrointestinal:  Positive for nausea. Negative for abdominal pain, diarrhea and vomiting.  Genitourinary:  Negative for difficulty urinating, flank pain and frequency.  Musculoskeletal:  Negative for arthralgias and back pain.  Skin:  Negative for rash.  Neurological:  Positive for weakness (Generalized). Negative for dizziness, speech difficulty, numbness and headaches.  Updated Vital Signs BP 92/62 (BP Location: Right Arm)   Pulse 75   Temp (!) 97.5 F (36.4 C) (Oral)   Resp (!) 26   SpO2 97%   Physical Exam Constitutional:      Appearance: He is well-developed. He is ill-appearing.  HENT:     Head: Normocephalic and atraumatic.  Eyes:     Pupils: Pupils are equal, round, and reactive to light.  Cardiovascular:     Rate and Rhythm: Normal rate and regular rhythm.     Heart sounds: Normal heart sounds.  Pulmonary:     Effort: Pulmonary effort is normal. No respiratory distress.     Breath sounds: Normal breath sounds. No wheezing or rales.  Chest:     Chest wall: No tenderness.  Abdominal:     General: Bowel sounds are normal.     Palpations: Abdomen is soft.     Tenderness: There is no abdominal tenderness. There is no guarding or rebound.  Musculoskeletal:        General: Normal range of motion.     Cervical back: Normal range of motion and neck supple.     Comments: Trace edema to lower extremities bilaterally  Lymphadenopathy:     Cervical: No cervical adenopathy.  Skin:    General: Skin is warm and dry.     Findings: No rash.  Neurological:     General: No focal deficit present.     Mental Status: He is alert and oriented to person, place, and time.     (all labs ordered are listed, but only abnormal results are displayed) Labs Reviewed  RESP PANEL BY RT-PCR  (RSV, FLU A&B, COVID)  RVPGX2 - Abnormal; Notable for the following components:      Result Value   SARS Coronavirus 2 by RT PCR POSITIVE (*)    All other components within normal limits  COMPREHENSIVE METABOLIC PANEL WITH GFR - Abnormal; Notable for the following components:   Sodium 133 (*)    Chloride 96 (*)    CO2 21 (*)    Glucose, Bld 176 (*)    BUN 24 (*)    Creatinine, Ser 1.26 (*)    GFR, Estimated 59 (*)    Anion gap 16 (*)    All other components within normal limits  CBC WITH DIFFERENTIAL/PLATELET - Abnormal; Notable for the following components:   Platelets 112 (*)    Monocytes Absolute 1.4 (*)    All other components within normal limits  PRO BRAIN NATRIURETIC PEPTIDE - Abnormal; Notable for the following components:   Pro Brain Natriuretic Peptide 4,388.0 (*)    All other components within normal limits  CK - Abnormal; Notable for the following components:   Total CK 43 (*)    All other components within normal limits  CBG MONITORING, ED - Abnormal; Notable for the following components:   Glucose-Capillary 214 (*)    All other components within normal limits  I-STAT CHEM 8, ED - Abnormal; Notable for the following components:   Sodium 133 (*)    BUN 25 (*)    Creatinine, Ser 1.30 (*)    Glucose, Bld 179 (*)    Calcium , Ion 1.12 (*)    TCO2 21 (*)    All other components within normal limits  TROPONIN T, HIGH SENSITIVITY - Abnormal; Notable for the following components:   Troponin T High Sensitivity 24 (*)    All other components within normal limits  I-STAT CG4 LACTIC ACID, ED  I-STAT CG4 LACTIC ACID, ED  TROPONIN T, HIGH SENSITIVITY    EKG: EKG Interpretation Date/Time:  Saturday November 11 2023 11:11:08 EDT Ventricular Rate:  126 PR Interval:    QRS Duration:  145 QT Interval:  407 QTC Calculation: 590 R Axis:   248  Text Interpretation: Atrial fibrillation Nonspecific IVCD with LAD Anterolateral infarct, recent Confirmed by Lenor Hollering  [phone]) on 11/11/2023 12:24:26 PM  Radiology: CT Head Wo Contrast Result Date: 11/11/2023 EXAM: CT HEAD WITHOUT CONTRAST 11/11/2023 11:04:06 AM TECHNIQUE: CT of the head was performed without the administration of intravenous contrast. Automated exposure control, iterative reconstruction, and/or weight based adjustment of the mA/kV was utilized to reduce the radiation dose to as low as reasonably achievable. COMPARISON: CT head without contrast 03/21/2023. CLINICAL HISTORY: Mental status change, unknown cause. Fall on blood thinners. FINDINGS: BRAIN AND VENTRICLES: No acute hemorrhage. No evidence of acute infarct. No hydrocephalus. No extra-axial collection. No mass effect or midline shift. Atherosclerotic calcifications are present in the cavernous carotid arteries bilaterally. No hyperdense vessel is present. ORBITS: Bilateral lens replacements are noted. The globes and orbits are otherwise within normal limits. SINUSES: No acute abnormality. SOFT TISSUES AND SKULL: No acute soft tissue abnormality. No skull fracture. IMPRESSION: 1. No acute intracranial abnormality related to the clinical history of mental status change and fall on blood thinners. Electronically signed by: Lonni Necessary MD 11/11/2023 11:22 AM EDT RP Workstation: [id number]   DG Chest Port 1 View Result Date: 11/11/2023 EXAM: 1 VIEW XRAY OF THE CHEST 11/11/2023 10:31:00 AM COMPARISON: 03/23/2023 CLINICAL HISTORY: Weakness. Per chart: t brought in by daughter after patient was found down after a mechanical fall. Pt was down about 15 minutes before help arrived. He states that his legs gave out on him while trying to get out of bed. Pt denies syncope or LOC. FINDINGS: LUNGS AND PLEURA: Mild pulmonary vascular congestion similar to prior exam. No focal pulmonary opacity. No pulmonary edema. No pleural effusion. No pneumothorax. HEART AND MEDIASTINUM: Left chest AICD/pacemaker with leads projecting over right atrium, right ventricle, and  left ventricle. Cardiomegaly, unchanged. The heart size is upper limits of normal. Aortic calcification. Neutral clip noted. BONES AND SOFT TISSUES: Multilevel thoracic osteophytosis. No acute osseous abnormality. IMPRESSION: 1. No acute findings. 2. Cardiomegaly, unchanged. 3. Mild pulmonary vascular congestion, similar to prior exam. Electronically signed by: Lonni Necessary MD 11/11/2023 11:20 AM EDT RP Workstation: [id number]     Procedures   Medications Ordered in the ED  lactated ringers  bolus 500 mL (0 mLs Intravenous Stopped 11/11/23 1142)                                    Medical Decision Making Amount and/or Complexity of Data Reviewed Labs: ordered. Radiology: ordered.  Risk Decision regarding hospitalization.   This patient presents to the ED for concern of weakness, this involves an extensive number of treatment options, and is a complaint that carries with it a high risk of complications and morbidity.  I considered the following differential and admission for this acute, potentially life threatening condition.  The differential diagnosis includes sepsis, viral respiratory infection, pneumonia,.  Pulmonary edema/CHF, UTI, dehydration, stroke  MDM:    Patient is a 78 year old who presents with generalized weakness and some increased shortness of breath.  He is noted to be hypotensive and hypoxic on arrival.  He was placed on nasal cannula oxygen and his oxygenation is  at 100% on room air.  He was given gentle fluid resuscitation with 500 cc of saline.  His blood pressure is still soft but in the 90s systolic.  He is mildly tachycardic.  His EKG appears to be irregular consistent with A-fib although he does have pacer spikes.  Chest x-ray shows evidence of mild pulmonary edema.  No evidence of pneumonia.  His BNP is elevated.  His troponin is minimally elevated but he does not have any ongoing chest pain.  No ischemic changes noted on EKG.  Will trend this.  His COVID test  is positive which is likely triggering his symptoms.  Head CT does not show any intracranial hemorrhage or other acute abnormality.  Will plan admission to the medicine service.  Discussed with Dr. Roxane  CRITICAL CARE Performed by: Andrea Ness Total critical care time: 70 minutes Critical care time was exclusive of separately billable procedures and treating other patients. Critical care was necessary to treat or prevent imminent or life-threatening deterioration. Critical care was time spent personally by me on the following activities: development of treatment plan with patient and/or surrogate as well as nursing, discussions with consultants, evaluation of patient's response to treatment, examination of patient, obtaining history from patient or surrogate, ordering and performing treatments and interventions, ordering and review of laboratory studies, ordering and review of radiographic studies, pulse oximetry and re-evaluation of patient's condition.   (Labs, imaging, consults)  Labs: I Ordered, and personally interpreted labs.  The pertinent results include: Positive COVID, mild elevation in his creatinine, mildly elevated troponin  Imaging Studies ordered: I ordered imaging studies including chest x-ray, head CT I independently visualized and interpreted imaging. I agree with the radiologist interpretation  Additional history obtained from family member.  External records from outside source obtained and reviewed including history  Cardiac Monitoring: The patient was maintained on a cardiac monitor.  If on the cardiac monitor, I personally viewed and interpreted the cardiac monitored which showed an underlying rhythm of: Atrial fibrillation  Reevaluation: After the interventions noted above, I reevaluated the patient and found that they have :improved  Social Determinants of Health:    Disposition: Admit to hospital  Co morbidities that complicate the patient  evaluation  Past Medical History:  Diagnosis Date   CALLUS, RIGHT FOOT 12/08/2009   Cardiac arrest (HCC)    Chronic systolic heart failure (HCC) 11/03/2009   DIABETES MELLITUS, TYPE II 12/05/2006   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/2009   HYPERLIPIDEMIA 11/25/2006   HYPOGONADISM, MALE 12/05/2006   Implantable cardiac defibrillator MDT 09/22/2008   Change out feb 2011, 2015   Ischemic cardiomyopathy 09/22/2008   DES CX and RCA  70% residual LAD  Done in [place]S; EF 30%;; myoviewq Dec 03, 2006 no ischemia   Kidney stones    Long term (current) use of anticoagulants 04/25/2010   Myocardial infarction (HCC) 12-02-04   I died and they brought me back   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   NEPHROLITHIASIS, HX OF 02/25/2008   Persistent atrial fibrillation (HCC) 11/25/2006   Inapp shock December 03, 2010 AF VR >220 recurrent     Medicines Meds ordered this encounter  Medications   lactated ringers  bolus 500 mL    I have reviewed the patients home medicines and have made adjustments as needed  Problem List / ED Course: Problem List Items Addressed This Visit   None Visit Diagnoses       COVID-19 virus infection    -  Primary     Acute  on chronic congestive heart failure, unspecified heart failure type (HCC)                    Final diagnoses:  COVID-19 virus infection  Acute on chronic congestive heart failure, unspecified heart failure type [facility])    ED Discharge Orders     None          Lenor Hollering, MD 11/11/23 1312

## 2023-11-11 NOTE — ED Triage Notes (Signed)
 Chief Complaint  Patient presents with   Fall    Pt brought in by daughter after patient was found down after a mechanical fall. Pt was down about 15 minutes before help arrived. He states that his legs gave out on him while trying to get out of bed. Pt denies syncope or LOC.

## 2023-11-11 NOTE — ED Notes (Signed)
 ED TO INPATIENT HANDOFF REPORT  ED Nurse Name and Phone #: Joaquim [phone]  S Name/Age/Gender [name] 78 y.o. male Room/Bed: WA09/WA09  Code Status   Code Status: Full Code  Home/SNF/Other Home Patient oriented to: self, place, time, and situation Is this baseline? Yes   Triage Complete: Triage complete  Chief Complaint Acute hypoxemic respiratory failure due to COVID-19 (HCC) [U07.1, J96.01]  Triage Note Chief Complaint  Patient presents with   Fall    Pt brought in by daughter after patient was found down after a mechanical fall. Pt was down about 15 minutes before help arrived. He states that his legs gave out on him while trying to get out of bed. Pt denies syncope or LOC.        Allergies Allergies  Allergen Reactions   Salmon [Fish Allergy] Other (See Comments)    Only canned/ not fresh (probably preservatives)   Sulfa  Antibiotics Other (See Comments)    Not sure he has an allergy to this (??)   Metformin  And Related Diarrhea    Level of Care/Admitting Diagnosis ED Disposition     ED Disposition  Admit   Condition  --   Comment  Hospital Area: [facility] [[id number]]  Level of Care: Progressive [102]  Admit to Progressive based on following criteria: MULTISYSTEM THREATS such as stable sepsis, metabolic/electrolyte imbalance with or without encephalopathy that is responding to early treatment.  May admit patient to [facility] or [facility] if equivalent level of care is available:: Yes  Covid Evaluation: Confirmed COVID Positive  Diagnosis: Acute hypoxemic respiratory failure due to COVID-19 [facility]) [[id number]]  Admitting Physician: ZELLA KATHA HERO [[id number]]  Attending Physician: ZELLA, MIR Hart.Gula [[id number]]  Certification:: I certify this patient will need inpatient services for at least 2 midnights  Expected Medical Readiness: 11/13/2023          B Medical/Surgery History Past Medical History:  Diagnosis Date   CALLUS, RIGHT  FOOT 12/08/2009   Cardiac arrest (HCC)    Chronic systolic heart failure (HCC) 11/03/2009   DIABETES MELLITUS, TYPE II 12/05/2006   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/2009   HYPERLIPIDEMIA 11/25/2006   HYPOGONADISM, MALE 12/05/2006   Implantable cardiac defibrillator MDT 09/22/2008   Change out feb 2011, 2015   Ischemic cardiomyopathy 09/22/2008   DES CX and RCA  70% residual LAD  Done in W-S; EF 30%;; myoviewq 11/26/06 no ischemia   Kidney stones    Long term (current) use of anticoagulants 04/25/2010   Myocardial infarction (HCC) November 25, 2004   I died and they brought me back   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   NEPHROLITHIASIS, HX OF 02/25/2008   Persistent atrial fibrillation (HCC) 11/25/2006   Inapp shock 26-Nov-2010 AF VR >220 recurrent   Past Surgical History:  Procedure Laterality Date   CARDIAC CATHETERIZATION     feburary 2012/11/25   CARDIAC DEFIBRILLATOR PLACEMENT     Guidant Vitality [name]   CARDIOVERSION N/A 04/25/2012   Procedure: CARDIOVERSION;  Surgeon: Debby Gallop, MD;  Location: [facility] ENDOSCOPY;  Service: Cardiovascular;  Laterality: N/A;   CARDIOVERSION N/A 05/21/2012   Procedure: CARDIOVERSION;  Surgeon: Elspeth JAYSON Sage, MD;  Location: [facility] ENDOSCOPY;  Service: Cardiovascular;  Laterality: N/A;   CATARACT EXTRACTION     EYE SURGERY     cataracts   IMPLANTABLE CARDIOVERTER DEFIBRILLATOR (ICD) GENERATOR CHANGE N/A 02/05/2014   Procedure: ICD GENERATOR CHANGE;  Surgeon: Elspeth JAYSON Sage, MD;  Location: [facility] CATH LAB;  Service: Cardiovascular;  Laterality: N/A;   PTCA     TONSILLECTOMY     TONSILLECTOMY     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   Procedure: TRANSMETATARSAL AMPUTATION RIGHT;  Surgeon: Gretta Lonni PARAS, MD;  [facility];  Service: Vascular;  Laterality: Right;     A IV Location/Drains/Wounds Patient Lines/Drains/Airways Status     Active Line/Drains/Airways     Name Placement date Placement time Site Days   Peripheral IV 11/11/23 20 G Left  Antecubital 11/11/23  1042  Antecubital  less than 1   Wound / Incision (Open or Dehisced) 03/23/23 Diabetic ulcer Foot Right 03/23/23  1205  Foot  233            Intake/Output Last 24 hours  Intake/Output Summary (Last 24 hours) at 11/11/2023 1540 Last data filed at 11/11/2023 1142 Gross per 24 hour  Intake 500 ml  Output --  Net 500 ml    Labs/Imaging Results for orders placed or performed during the hospital encounter of 11/11/23 (from the past 48 hours)  CBG monitoring, ED     Status: Abnormal   Collection Time: 11/11/23  9:47 AM  Result Value Ref Range   Glucose-Capillary 214 (H) 70 - 99 mg/dL    Comment: Glucose reference range applies only to samples taken after fasting for at least 8 hours.   Comment 1 Notify RN   Comprehensive metabolic panel     Status: Abnormal   Collection Time: 11/11/23 10:48 AM  Result Value Ref Range   Sodium 133 (L) 135 - 145 mmol/L   Potassium 4.6 3.5 - 5.1 mmol/L   Chloride 96 (L) 98 - 111 mmol/L   CO2 21 (L) 22 - 32 mmol/L   Glucose, Bld 176 (H) 70 - 99 mg/dL    Comment: Glucose reference range applies only to samples taken after fasting for at least 8 hours.   BUN 24 (H) 8 - 23 mg/dL   Creatinine, Ser 8.73 (H) 0.61 - 1.24 mg/dL   Calcium  9.0 8.9 - 10.3 mg/dL   Total Protein 6.9 6.5 - 8.1 g/dL   Albumin 4.1 3.5 - 5.0 g/dL   AST 30 15 - 41 U/L   ALT 20 0 - 44 U/L   Alkaline Phosphatase 79 38 - 126 U/L   Total Bilirubin 0.5 0.0 - 1.2 mg/dL   GFR, Estimated 59 (L) >60 mL/min    Comment: (NOTE) Calculated using the CKD-EPI Creatinine Equation (2021)    Anion gap 16 (H) 5 - 15    Comment: Performed at [facility], [address] [place], [place] [address]  CBC with Differential     Status: Abnormal   Collection Time: 11/11/23 10:48 AM  Result Value Ref Range   WBC 7.2 4.0 - 10.5 K/uL   RBC 4.79 4.22 - 5.81 MIL/uL   Hemoglobin 14.2 13.0 - 17.0 g/dL   HCT 57.0 60.9 - 47.9 %   MCV 89.6 80.0 - 100.0 fL   MCH 29.6  26.0 - 34.0 pg   MCHC 33.1 30.0 - 36.0 g/dL   RDW 84.8 88.4 - 84.4 %   Platelets 112 (L) 150 - 400 K/uL   nRBC 0.0 0.0 - 0.2 %   Neutrophils Relative % 68 %   Neutro Abs 4.9 1.7 - 7.7 K/uL   Lymphocytes Relative 12 %   Lymphs Abs 0.8 0.7 - 4.0 K/uL   Monocytes Relative 19 %   Monocytes Absolute 1.4 (H) 0.1 - 1.0 K/uL   Eosinophils  Relative 0 %   Eosinophils Absolute 0.0 0.0 - 0.5 K/uL   Basophils Relative 0 %   Basophils Absolute 0.0 0.0 - 0.1 K/uL   Immature Granulocytes 1 %   Abs Immature Granulocytes 0.04 0.00 - 0.07 K/uL    Comment: Performed at [facility], [address], [place], [place] [address]  Troponin T, High Sensitivity     Status: Abnormal   Collection Time: 11/11/23 10:48 AM  Result Value Ref Range   Troponin T High Sensitivity 24 (H) 0 - 19 ng/L    Comment: (NOTE) Biotin concentrations > 1000 ng/mL falsely decrease TnT results.  Serial cardiac troponin measurements are suggested.  Refer to the Links section for chest pain algorithms and additional  guidance. Performed at [facility], [address] [place], [place] [address]   Resp panel by RT-PCR (RSV, Flu A&B, Covid) Anterior Nasal Swab     Status: Abnormal   Collection Time: 11/11/23 10:48 AM   Specimen: Anterior Nasal Swab  Result Value Ref Range   SARS Coronavirus 2 by RT PCR POSITIVE (A) NEGATIVE    Comment: (NOTE) SARS-CoV-2 target nucleic acids are DETECTED.  The SARS-CoV-2 RNA is generally detectable in upper respiratory specimens during the acute phase of infection. Positive results are indicative of the presence of the identified virus, but do not rule out bacterial infection or co-infection with other pathogens not detected by the test. Clinical correlation with patient history and other diagnostic information is necessary to determine patient infection status. The expected result is Negative.  Fact Sheet for  Patients: BloggerCourse.com  Fact Sheet for Healthcare Providers: SeriousBroker.it  This test is not yet approved or cleared by the [place]  FDA and  has been authorized for detection and/or diagnosis of SARS-CoV-2 by FDA under an Emergency Use Authorization (EUA).  This EUA will remain in effect (meaning this test can be used) for the duration of  the COVID-19 declaration under Section 564(b)(1) of the A ct, 21 U.S.C. section 360bbb-3(b)(1), unless the authorization is terminated or revoked sooner.     Influenza A by PCR NEGATIVE NEGATIVE   Influenza B by PCR NEGATIVE NEGATIVE    Comment: (NOTE) The Xpert Xpress SARS-CoV-2/FLU/RSV plus assay is intended as an aid in the diagnosis of influenza from Nasopharyngeal swab specimens and should not be used as a sole basis for treatment. Nasal washings and aspirates are unacceptable for Xpert Xpress SARS-CoV-2/FLU/RSV testing.  Fact Sheet for Patients: BloggerCourse.com  Fact Sheet for Healthcare Providers: SeriousBroker.it  This test is not yet approved or cleared by the [place]  FDA and has been authorized for detection and/or diagnosis of SARS-CoV-2 by FDA under an Emergency Use Authorization (EUA). This EUA will remain in effect (meaning this test can be used) for the duration of the COVID-19 declaration under Section 564(b)(1) of the Act, 21 U.S.C. section 360bbb-3(b)(1), unless the authorization is terminated or revoked.     Resp Syncytial Virus by PCR NEGATIVE NEGATIVE    Comment: (NOTE) Fact Sheet for Patients: BloggerCourse.com  Fact Sheet for Healthcare Providers: SeriousBroker.it  This test is not yet approved or cleared by the [place]  FDA and has been authorized for detection and/or diagnosis of SARS-CoV-2 by FDA under an Emergency Use Authorization  (EUA). This EUA will remain in effect (meaning this test can be used) for the duration of the COVID-19 declaration under Section 564(b)(1) of the Act, 21 U.S.C. section 360bbb-3(b)(1), unless the authorization is terminated or revoked.  Performed  at [facility], [address], [place], [place] [address]   Dozier brew 8, ED     Status: Abnormal   Collection Time: 11/11/23 10:49 AM  Result Value Ref Range   Sodium 133 (L) 135 - 145 mmol/L   Potassium 4.5 3.5 - 5.1 mmol/L   Chloride 98 98 - 111 mmol/L   BUN 25 (H) 8 - 23 mg/dL   Creatinine, Ser 8.69 (H) 0.61 - 1.24 mg/dL   Glucose, Bld 820 (H) 70 - 99 mg/dL    Comment: Glucose reference range applies only to samples taken after fasting for at least 8 hours.   Calcium , Ion 1.12 (L) 1.15 - 1.40 mmol/L   TCO2 21 (L) 22 - 32 mmol/L   Hemoglobin 14.6 13.0 - 17.0 g/dL   HCT 56.9 60.9 - 47.9 %  I-Stat Lactic Acid     Status: None   Collection Time: 11/11/23 10:49 AM  Result Value Ref Range   Lactic Acid, Venous 1.5 0.5 - 1.9 mmol/L  Pro Brain natriuretic peptide     Status: Abnormal   Collection Time: 11/11/23 10:49 AM  Result Value Ref Range   Pro Brain Natriuretic Peptide 4,388.0 (H) <300.0 pg/mL    Comment: (NOTE) Age Group        Cut-Points    Interpretation  < 50 years     450 pg/mL       NT-proBNP > 450 pg/mL indicates                                ADHF is likely              50 to 75 years  900 pg/mL      NT-proBNP > 900 pg/mL indicates          ADHF is likely  > 75 years      1800 pg/mL     NT-proBNP > 1800 pg/mL indicates          ADHF is likely                           All ages    Results between       Indeterminate. Further clinical             300 and the cut-   information is needed to determine            point for age group   if ADHF is present.                                                             Elecsys proBNP II/ Elecsys proBNP II STAT           Cut-Point                        Interpretation  300 pg/mL                    NT-proBNP <300pg/mL indicates                             ADHF is  not likely  Performed at [facility], [address] [place], [place] [address]   CK     Status: Abnormal   Collection Time: 11/11/23 10:49 AM  Result Value Ref Range   Total CK 43 (L) 49 - 397 U/L    Comment: Performed at [facility], [address] [place], [place] [address]  Troponin T, High Sensitivity     Status: Abnormal   Collection Time: 11/11/23  1:43 PM  Result Value Ref Range   Troponin T High Sensitivity 21 (H) 0 - 19 ng/L    Comment: (NOTE) Biotin concentrations > 1000 ng/mL falsely decrease TnT results.  Serial cardiac troponin measurements are suggested.  Refer to the Links section for chest pain algorithms and additional  guidance. Performed at [facility], [address], [place], [place] [address]   I-Stat Lactic Acid     Status: None   Collection Time: 11/11/23  1:44 PM  Result Value Ref Range   Lactic Acid, Venous 1.4 0.5 - 1.9 mmol/L   CT Head Wo Contrast Result Date: 11/11/2023 EXAM: CT HEAD WITHOUT CONTRAST 11/11/2023 11:04:06 AM TECHNIQUE: CT of the head was performed without the administration of intravenous contrast. Automated exposure control, iterative reconstruction, and/or weight based adjustment of the mA/kV was utilized to reduce the radiation dose to as low as reasonably achievable. COMPARISON: CT head without contrast 03/21/2023. CLINICAL HISTORY: Mental status change, unknown cause. Fall on blood thinners. FINDINGS: BRAIN AND VENTRICLES: No acute hemorrhage. No evidence of acute infarct. No hydrocephalus. No extra-axial collection. No mass effect or midline shift. Atherosclerotic calcifications are present in the cavernous carotid arteries bilaterally. No hyperdense vessel is present. ORBITS: Bilateral lens replacements are noted. The globes and orbits are otherwise within normal  limits. SINUSES: No acute abnormality. SOFT TISSUES AND SKULL: No acute soft tissue abnormality. No skull fracture. IMPRESSION: 1. No acute intracranial abnormality related to the clinical history of mental status change and fall on blood thinners. Electronically signed by: Lonni Necessary MD 11/11/2023 11:22 AM EDT RP Workstation: [id number]   DG Chest Port 1 View Result Date: 11/11/2023 EXAM: 1 VIEW XRAY OF THE CHEST 11/11/2023 10:31:00 AM COMPARISON: 03/23/2023 CLINICAL HISTORY: Weakness. Per chart: t brought in by daughter after patient was found down after a mechanical fall. Pt was down about 15 minutes before help arrived. He states that his legs gave out on him while trying to get out of bed. Pt denies syncope or LOC. FINDINGS: LUNGS AND PLEURA: Mild pulmonary vascular congestion similar to prior exam. No focal pulmonary opacity. No pulmonary edema. No pleural effusion. No pneumothorax. HEART AND MEDIASTINUM: Left chest AICD/pacemaker with leads projecting over right atrium, right ventricle, and left ventricle. Cardiomegaly, unchanged. The heart size is upper limits of normal. Aortic calcification. Neutral clip noted. BONES AND SOFT TISSUES: Multilevel thoracic osteophytosis. No acute osseous abnormality. IMPRESSION: 1. No acute findings. 2. Cardiomegaly, unchanged. 3. Mild pulmonary vascular congestion, similar to prior exam. Electronically signed by: Lonni Necessary MD 11/11/2023 11:20 AM EDT RP Workstation: [id number]    Pending Labs Unresulted Labs (From admission, onward)     Start     Ordered   11/12/23 0500  Basic metabolic panel  Tomorrow morning,   R        11/11/23 1409   11/12/23 0500  CBC  Tomorrow morning,   R        11/11/23 1409  Vitals/Pain Today's Vitals   11/11/23 0949 11/11/23 1105 11/11/23 1130 11/11/23 1302  BP: 94/78 (!) 87/62 (!) 96/59 92/62  Pulse: 68 81  75  Resp: (!) 21 (!) 22 (!) 26 (!) 26  Temp: 98.4 F (36.9 C) 97.6 F (36.4 C)  (!)  97.5 F (36.4 C)  TempSrc:  Oral  Oral  SpO2: (S) 90% 100%  97%  PainSc: 0-No pain       Isolation Precautions Airborne and Contact precautions  Medications Medications  zolpidem  (AMBIEN ) tablet 10 mg (has no administration in time range)  dofetilide  (TIKOSYN ) capsule 250 mcg (has no administration in time range)  FLUoxetine  (PROZAC ) capsule 10 mg (has no administration in time range)  citalopram  (CELEXA ) tablet 40 mg (has no administration in time range)  LORazepam  (ATIVAN ) tablet 0.5 mg (has no administration in time range)  rivaroxaban  (XARELTO ) tablet 20 mg (has no administration in time range)  acetaminophen  (TYLENOL ) tablet 650 mg (has no administration in time range)    Or  acetaminophen  (TYLENOL ) suppository 650 mg (has no administration in time range)  albuterol  (PROVENTIL ) (2.5 MG/3ML) 0.083% nebulizer solution 2.5 mg (has no administration in time range)  insulin  aspart (novoLOG ) injection 0-15 Units (has no administration in time range)  insulin  aspart (novoLOG ) injection 0-5 Units (has no administration in time range)  dexamethasone  (DECADRON ) tablet 6 mg (has no administration in time range)  0.9 %  sodium chloride  infusion (has no administration in time range)  remdesivir  100 mg in sodium chloride  0.9 % 100 mL IVPB (has no administration in time range)    Followed by  remdesivir  100 mg in sodium chloride  0.9 % 100 mL IVPB (has no administration in time range)  insulin  glargine (LANTUS ) injection 14 Units (has no administration in time range)  metoprolol  succinate (TOPROL -XL) 24 hr tablet 200 mg (has no administration in time range)  lactated ringers  bolus 500 mL (0 mLs Intravenous Stopped 11/11/23 1142)    Mobility walks with person assist     Focused Assessments    R Recommendations: See Admitting Provider Note  Report given to:   Additional Notes:  '

## 2023-11-12 DIAGNOSIS — I5042 Chronic combined systolic (congestive) and diastolic (congestive) heart failure: Secondary | ICD-10-CM

## 2023-11-12 DIAGNOSIS — E0910 Drug or chemical induced diabetes mellitus with ketoacidosis without coma: Secondary | ICD-10-CM | POA: Diagnosis not present

## 2023-11-12 DIAGNOSIS — R531 Weakness: Secondary | ICD-10-CM

## 2023-11-12 DIAGNOSIS — E1110 Type 2 diabetes mellitus with ketoacidosis without coma: Secondary | ICD-10-CM | POA: Insufficient documentation

## 2023-11-12 DIAGNOSIS — I480 Paroxysmal atrial fibrillation: Secondary | ICD-10-CM

## 2023-11-12 DIAGNOSIS — U071 COVID-19: Secondary | ICD-10-CM | POA: Diagnosis not present

## 2023-11-12 DIAGNOSIS — I959 Hypotension, unspecified: Secondary | ICD-10-CM | POA: Insufficient documentation

## 2023-11-12 DIAGNOSIS — D696 Thrombocytopenia, unspecified: Secondary | ICD-10-CM

## 2023-11-12 LAB — BASIC METABOLIC PANEL WITH GFR
Anion gap: 17 — ABNORMAL HIGH (ref 5–15)
Anion gap: 17 — ABNORMAL HIGH (ref 5–15)
Anion gap: 13 (ref 5–15)
BUN: 27 mg/dL — ABNORMAL HIGH (ref 8–23)
BUN: 29 mg/dL — ABNORMAL HIGH (ref 8–23)
BUN: 28 mg/dL — ABNORMAL HIGH (ref 8–23)
CO2: 19 mmol/L — ABNORMAL LOW (ref 22–32)
CO2: 19 mmol/L — ABNORMAL LOW (ref 22–32)
CO2: 22 mmol/L (ref 22–32)
Calcium: 8.7 mg/dL — ABNORMAL LOW (ref 8.9–10.3)
Calcium: 8.5 mg/dL — ABNORMAL LOW (ref 8.9–10.3)
Calcium: 8.8 mg/dL — ABNORMAL LOW (ref 8.9–10.3)
Chloride: 100 mmol/L (ref 98–111)
Chloride: 100 mmol/L (ref 98–111)
Chloride: 99 mmol/L (ref 98–111)
Creatinine, Ser: 1.17 mg/dL (ref 0.61–1.24)
Creatinine, Ser: 1.13 mg/dL (ref 0.61–1.24)
Creatinine, Ser: 1.15 mg/dL (ref 0.61–1.24)
GFR, Estimated: >60 mL/min (ref >60)
GFR, Estimated: >60 mL/min (ref >60)
GFR, Estimated: >60 mL/min (ref >60)
Glucose, Bld: 160 mg/dL — ABNORMAL HIGH (ref 70–99)
Glucose, Bld: 192 mg/dL — ABNORMAL HIGH (ref 70–99)
Glucose, Bld: 211 mg/dL — ABNORMAL HIGH (ref 70–99)
Potassium: 4.1 mmol/L (ref 3.5–5.1)
Potassium: 4.3 mmol/L (ref 3.5–5.1)
Potassium: 4 mmol/L (ref 3.5–5.1)
Sodium: 136 mmol/L (ref 135–145)
Sodium: 135 mmol/L (ref 135–145)
Sodium: 134 mmol/L — ABNORMAL LOW (ref 135–145)

## 2023-11-12 LAB — GLUCOSE, CAPILLARY
Glucose-Capillary: 157 mg/dL — ABNORMAL HIGH (ref 70–99)
Glucose-Capillary: 192 mg/dL — ABNORMAL HIGH (ref 70–99)
Glucose-Capillary: 215 mg/dL — ABNORMAL HIGH (ref 70–99)
Glucose-Capillary: 267 mg/dL — ABNORMAL HIGH (ref 70–99)

## 2023-11-12 LAB — CBC
HCT: 42.6 % (ref 39.0–52.0)
Hemoglobin: 13.8 g/dL (ref 13.0–17.0)
MCH: 29.6 pg (ref 26.0–34.0)
MCHC: 32.4 g/dL (ref 30.0–36.0)
MCV: 91.4 fL (ref 80.0–100.0)
Platelets: 101 K/uL — ABNORMAL LOW (ref 150–400)
RBC: 4.66 MIL/uL (ref 4.22–5.81)
RDW: 15.3 % (ref 11.5–15.5)
WBC: 4.7 K/uL (ref 4.0–10.5)
nRBC: 0 % (ref 0.0–0.2)

## 2023-11-12 LAB — BETA-HYDROXYBUTYRIC ACID
Beta-Hydroxybutyric Acid: 1.84 mmol/L — ABNORMAL HIGH (ref 0.05–0.27)
Beta-Hydroxybutyric Acid: 0.66 mmol/L — ABNORMAL HIGH (ref 0.05–0.27)

## 2023-11-12 MED ORDER — NITROGLYCERIN 0.4 MG SL SUBL: 0.4000 mg | SUBLINGUAL_TABLET | SUBLINGUAL | Status: DC | PRN

## 2023-11-12 MED ORDER — ASPIRIN 81 MG PO TBEC
81.0000 mg | DELAYED_RELEASE_TABLET | Freq: Every day | ORAL | Status: DC
  Administered 2023-11-12 – 2023-11-13 (×2): 81 mg via ORAL
  Filled 2023-11-12 (×2): qty 1

## 2023-11-12 MED ORDER — ROSUVASTATIN CALCIUM 20 MG PO TABS
20.0000 mg | ORAL_TABLET | Freq: Every day | ORAL | Status: DC
  Administered 2023-11-12 – 2023-11-13 (×2): 20 mg via ORAL
  Filled 2023-11-12 (×2): qty 1

## 2023-11-12 MED ORDER — LACTATED RINGERS IV BOLUS: 500.0000 mL | Freq: Once | INTRAVENOUS | Status: AC

## 2023-11-12 MED ORDER — LACTATED RINGERS IV BOLUS
500.0000 mL | Freq: Once | INTRAVENOUS | Status: AC
  Administered 2023-11-12: 500 mL via INTRAVENOUS

## 2023-11-12 MED ORDER — TORSEMIDE 20 MG PO TABS
10.0000 mg | ORAL_TABLET | ORAL | Status: DC
  Administered 2023-11-13: 10 mg via ORAL
  Filled 2023-11-12: qty 1

## 2023-11-12 MED ORDER — STUDY - INVESTIGATIONAL MEDICATION
2.0000 | Freq: Every day | Status: DC
  Administered 2023-11-12 – 2023-11-13 (×2): 2 via ORAL
  Filled 2023-11-12 (×4): qty 1

## 2023-11-12 NOTE — Hospital Course (Addendum)
 78 year old man PMH including diabetes, combined systolic and diastolic CHF, PAF, presented with generalized weakness, cough and congestion.  Admitted for generalized weakness, COVID.  Found to have euglycemic DKA.   Consultants None  Procedures/Events None

## 2023-11-12 NOTE — Progress Notes (Signed)
 Progress Note   Patient: [name] FMW:[id number] DOB: [birth year] DOA: 11/11/2023     1 DOS: the patient was seen and examined on 11/12/2023   Brief hospital course: 78 year old man PMH including diabetes, combined systolic and diastolic CHF, PAF, presented with generalized weakness, cough and congestion.  Admitted for generalized weakness, COVID.  Found to have euglycemic DKA.   Consultants None  Procedures/Events None   Assessment and Plan: Generalized weakness COVID infection Patient reports symptoms for some time I cannot find any documentation of hypoxia, at this time acute hypoxic respiratory failure ruled out.  1 data point of SpO2 90% on room air.  No indication for remdesivir , no infiltrates on chest x-ray.  Can continue steroid given SpO2 less than 94%. Clinically appears recovered at this time.  Euglycemic DKA Diabetes mellitus type 2 Has anion gap metabolic acidosis, patient on Jardiance , appears to have euglycemic DKA.  Relatively asymptomatic. Given overall clinical status will give fluids continue sliding scale insulin  and repeat BMP and beta hydroxybutyric acid.  If remains elevated may need to transfer to stepdown for insulin  infusion.  May be able to avoid this with fluids and subcutaneous insulin .  Already on Lantus . Jardiance  discontinued.   Hypotension A few soft blood pressures documented.  Suspect dehydration related given mildly elevated creatinine on admission. Appears stable now.  Will continue fluids judiciously in context of heart failure given above. Holding antihypertensives.  Likely resume Demadex  tomorrow.   Combined systolic and diastolic congestive heart failure Followed by cardiology Dr. Myra at [facility], last echo LVEF 20 to 25%. Elevated BMP 4,388. Troponins trivial elevation 24,21 Given improvement today and blood pressure can continue metoprolol    Paroxysmal atrial fibrillation Paced rhythm Continue Tikosyn , metoprolol  and Xarelto  for stroke  prophylaxis.  Monitor with cardiac telemetry.  Thrombocytopenia Probably related to acute infection.  Follow-up.      Subjective:  Feels much better  Physical Exam: Vitals:   11/11/23 2148 11/12/23 0006 11/12/23 0519 11/12/23 1328  BP:  96/71 107/72 100/71  Pulse:  75 74 76  Resp:  20 16 18   Temp: 98.2 F (36.8 C) 97.9 F (36.6 C) 97.7 F (36.5 C) 98.3 F (36.8 C)  TempSrc: Oral   Oral  SpO2:  100% 96% 96%  Weight:      Height:       Physical Exam Vitals reviewed.  Constitutional:      General: He is not in acute distress.    Appearance: He is not ill-appearing or toxic-appearing.  Cardiovascular:     Rate and Rhythm: Normal rate and regular rhythm.     Heart sounds: No murmur heard. Pulmonary:     Effort: Pulmonary effort is normal. No respiratory distress.     Breath sounds: No wheezing, rhonchi or rales.  Neurological:     Mental Status: He is alert.  Psychiatric:        Mood and Affect: Mood normal.        Behavior: Behavior normal.     Data Reviewed: CBG modestly elevated.  In review of data from admission, noted to have anion gap metabolic acidosis with CO2 of 21 and anion gap of 16 on admission.  Repeat today shows persistent anion gap metabolic acidosis with CO2 of 19 and anion gap of 17. Beta hydroxybutyric acid elevated at 1.84 Creatinine peaked at 1.3, down to 1.13.  Baseline around 1.1. Troponins modestly elevated in the 20s BNP elevated at 4388 Platelets 112, repeat 101 today COVID-positive  Family Communication:  Wife and daughter at bedside  Disposition: Status is: Inpatient Remains inpatient appropriate because: COVID, euglycemic DKA     Time spent: 45 minutes  Author: Toribio Door, MD 11/12/2023 4:01 PM  For on call review [email or web address].

## 2023-11-13 DIAGNOSIS — U071 COVID-19: Secondary | ICD-10-CM | POA: Diagnosis not present

## 2023-11-13 DIAGNOSIS — I5042 Chronic combined systolic (congestive) and diastolic (congestive) heart failure: Secondary | ICD-10-CM | POA: Diagnosis not present

## 2023-11-13 DIAGNOSIS — I480 Paroxysmal atrial fibrillation: Secondary | ICD-10-CM | POA: Diagnosis not present

## 2023-11-13 DIAGNOSIS — E0910 Drug or chemical induced diabetes mellitus with ketoacidosis without coma: Secondary | ICD-10-CM | POA: Diagnosis not present

## 2023-11-13 LAB — CBC
HCT: 42.6 % (ref 39.0–52.0)
Hemoglobin: 14.2 g/dL (ref 13.0–17.0)
MCH: 30.1 pg (ref 26.0–34.0)
MCHC: 33.3 g/dL (ref 30.0–36.0)
MCV: 90.3 fL (ref 80.0–100.0)
Platelets: 103 K/uL — ABNORMAL LOW (ref 150–400)
RBC: 4.72 MIL/uL (ref 4.22–5.81)
RDW: 15.1 % (ref 11.5–15.5)
WBC: 5.2 K/uL (ref 4.0–10.5)
nRBC: 0 % (ref 0.0–0.2)

## 2023-11-13 LAB — BASIC METABOLIC PANEL WITH GFR
Anion gap: 14 (ref 5–15)
BUN: 29 mg/dL — ABNORMAL HIGH (ref 8–23)
CO2: 20 mmol/L — ABNORMAL LOW (ref 22–32)
Calcium: 9 mg/dL (ref 8.9–10.3)
Chloride: 101 mmol/L (ref 98–111)
Creatinine, Ser: 1 mg/dL (ref 0.61–1.24)
GFR, Estimated: >60 mL/min (ref >60)
Glucose, Bld: 193 mg/dL — ABNORMAL HIGH (ref 70–99)
Potassium: 4.2 mmol/L (ref 3.5–5.1)
Sodium: 136 mmol/L (ref 135–145)

## 2023-11-13 LAB — GLUCOSE, CAPILLARY
Glucose-Capillary: 174 mg/dL — ABNORMAL HIGH (ref 70–99)
Glucose-Capillary: 258 mg/dL — ABNORMAL HIGH (ref 70–99)
Glucose-Capillary: 164 mg/dL — ABNORMAL HIGH (ref 70–99)

## 2023-11-13 MED ORDER — DEXAMETHASONE 6 MG PO TABS: 6.0000 mg | ORAL_TABLET | ORAL | 0 refills | Status: AC

## 2023-11-13 MED ORDER — GUAIFENESIN-DM 100-10 MG/5ML PO SYRP
5.0000 mL | ORAL_SOLUTION | ORAL | Status: DC | PRN
  Administered 2023-11-13: 5 mL via ORAL
  Filled 2023-11-13: qty 10

## 2023-11-13 MED ORDER — CITALOPRAM HYDROBROMIDE 20 MG PO TABS: 20.0000 mg | ORAL_TABLET | Freq: Every day | ORAL | 0 refills | Status: AC

## 2023-11-13 MED ORDER — CITALOPRAM HYDROBROMIDE 20 MG PO TABS: 20.0000 mg | ORAL_TABLET | Freq: Every day | ORAL | Status: DC

## 2023-11-13 NOTE — Progress Notes (Signed)
 Mobility Specialist - Progress Note  Post-mobility: 77 bpm HR, 101/72 bpm BP,    11/13/23 1424  Mobility  Activity Ambulated with assistance  Level of Assistance Contact guard assist, steadying assist  Assistive Device Front wheel walker  Distance Ambulated (ft) 180 ft  Range of Motion/Exercises Active  Activity Response Tolerated fair  Mobility Referral Yes  Mobility visit 1 Mobility  Mobility Specialist Start Time (ACUTE ONLY) 1400  Mobility Specialist Stop Time (ACUTE ONLY) 1424  Mobility Specialist Time Calculation (min) (ACUTE ONLY) 24 min   Pt was found in bed and agreeable to ambulate. Pt stated feeling dizzy during session and returned to room. BP taken and recorded above. At EOS returned to bed with all needs met. Call bell in reach.   Erminio Leos,  Mobility Specialist Can be reached via Secure Chat

## 2023-11-13 NOTE — Progress Notes (Signed)
 AVS reviewed w/ patient & Cy (ex-wife).  Cy verbalized an understanding of changes to meds and heart failure instructions. PIV removed as noted.  Pt verbalized an understanding that he should remove coban from PIV site once he arrives home. Extra copy of AVS given to Cy, along with heart failure packet. Pt eating dinner and will then dress for home. Pt's pharmacy will deliver meds to home. Pt  verbalized an understanding that he needed to take both of these meds tomorrow as directed.

## 2023-11-13 NOTE — Discharge Summary (Signed)
 Physician Discharge Summary   Patient: [name] MRN: [id number] DOB: 02/12/1946  Admit date:     11/11/2023  Discharge date: 11/14/23  Discharge Physician: Toribio Door   PCP: Norleen Lynwood ORN, MD   Recommendations at discharge:  Stopped Jardiance , see discussion below.  Discharge Diagnoses: Principal Problem:   COVID-19 virus infection Active Problems:   PAF (paroxysmal atrial fibrillation) (HCC)   Generalized weakness   DKA (diabetic ketoacidosis) (HCC)   Hypotension   Chronic combined systolic and diastolic CHF (congestive heart failure) (HCC)   Thrombocytopenia (HCC)  Resolved Problems:   * No resolved hospital problems. *  Hospital Course: 78 year old man PMH including diabetes, combined systolic and diastolic CHF, PAF, presented with generalized weakness, cough and congestion.  Admitted for generalized weakness, COVID.  Found to have euglycemic DKA.  Improved with fluids.  COVID inconsequential.  Consultants None  Procedures/Events None   Generalized weakness COVID infection Patient reports symptoms for some time I cannot find any documentation of hypoxia, at this time acute hypoxic respiratory failure ruled out.  1 data point of SpO2 90% on room air.  No indication for remdesivir , no infiltrates on chest x-ray.  Can continue steroid given SpO2 less than 94%. Clinically appears recovered   Euglycemic DKA Diabetes mellitus type 2 Has anion gap metabolic acidosis, patient on Jardiance , appears to have euglycemic DKA.  Relatively asymptomatic. Fortunately responded to fluids and withholding of Jardiance .  Did not require insulin  infusion. Jardiance  discontinued.   Hypotension A few soft blood pressures documented.  Suspect dehydration related given mildly elevated creatinine on admission. Resolved   Combined systolic and diastolic congestive heart failure Followed by cardiology Dr. Myra at [facility], last echo LVEF 20 to 25%. Elevated BMP 4,388. Troponins trivial  elevation 24,21 Given improvement today and blood pressure can continue metoprolol  Stop Jardiance  given euglycemic DKA   Paroxysmal atrial fibrillation Paced rhythm Continue Tikosyn , metoprolol  and Xarelto  for stroke prophylaxis.  Monitor with cardiac telemetry.   Thrombocytopenia Probably related to acute infection.  Follow-up.  Disposition: Home Diet recommendation:  Cardiac diet diabetic diet DISCHARGE MEDICATION: Allergies as of 76/15/2025       Reactions   Salmon [fish Allergy] Other (See Comments)   Only canned/ not fresh (probably preservatives)   Sulfa  Antibiotics Other (See Comments)   Not sure he has an allergy to this (??)   Metformin  And Related Diarrhea        Medication List     STOP taking these medications    FLUoxetine  10 MG capsule Commonly known as: PROZAC    Jardiance  25 MG Tabs tablet Generic drug: empagliflozin        TAKE these medications    aspirin  EC 81 MG tablet Take 81 mg by mouth daily. Swallow whole.   Centrum Silver 50+Men Tabs Take 1 tablet by mouth daily with breakfast.   citalopram  20 MG tablet Commonly known as: CELEXA  Take 1 tablet (20 mg total) by mouth daily. What changed:  medication strength how much to take   dexamethasone  6 MG tablet Commonly known as: DECADRON  Take 1 tablet (6 mg total) by mouth daily.   dofetilide  250 MCG capsule Commonly known as: TIKOSYN  Take 1 capsule (250 mcg total) by mouth every 12 (twelve) hours.   Entresto  24-26 MG Generic drug: sacubitril -valsartan  Take 1 tablet by mouth 2 (two) times daily. Please hold for SBP < 100   eplerenone 50 MG tablet Commonly known as: INSPRA Take 50 mg by mouth in the morning.   fluticasone   50 MCG/ACT nasal spray Commonly known as: FLONASE  USE 2 SPRAYS IN EACH NOSTRIL EVERY DAY What changed:  when to take this reasons to take this   FreeStyle Libre 2 Reader Espiridion Use as DIRECTED to check blood sugar   FreeStyle Libre 2 Sensor Misc Apply new  sensor every 14 days   FreeStyle Libre 2 Plus Sensor Misc Change sensor every 15 days.   glucose blood test strip Commonly known as: ONE TOUCH ULTRA TEST 1 each by Other route 2 (two) times daily.   insulin  lispro 100 UNIT/ML KwikPen Commonly known as: HumaLOG  KwikPen INJECT 10-14 units under skin before meals, 3 times a day, as advised   Investigational - Study Medication Take 1 tablet by mouth daily. Study name: [id number]  Additional study details: Take 1 tablet from WHITE label bottle and 1 tablet from Blue label bottle.   ketoconazole  2 % cream Commonly known as: NIZORAL  Apply 1 Application topically daily.   Lancets Misc Use as directed up to 4 times per day   Lantus  SoloStar 100 UNIT/ML Solostar Pen Generic drug: insulin  glargine INJECT 14 UNITS UNDER THE SKIN DAILY   liraglutide 18 MG/3ML Sopn Commonly known as: VICTOZA Inject 1.8 mg into the skin in the morning.   LORazepam  0.5 MG tablet Commonly known as: ATIVAN  Take 0.5 mg by mouth every 8 (eight) hours as needed for anxiety.   MAG-OX 400 PO Take 400 mg by mouth 2 (two) times daily.   metoprolol  200 MG 24 hr tablet Commonly known as: TOPROL -XL Take 200 mg by mouth daily after supper.   Nitrostat  0.4 MG SL tablet Generic drug: nitroGLYCERIN  Place 1 tablet (0.4 mg total) under the tongue every 5 (five) minutes as needed for chest pain.   rosuvastatin  20 MG tablet Commonly known as: CRESTOR  TAKE 1 TABLET BY MOUTH EVERY DAY   silver sulfADIAZINE 1 % cream Commonly known as: SILVADENE Apply 1 Application topically daily.   torsemide  10 MG tablet Commonly known as: DEMADEX  Take 10 mg by mouth 3 (three) times a week. On Mondays, Wednesdays, Fridays   UltiCare Micro Pen Needles 32G X 4 MM Misc Generic drug: Insulin  Pen Needle USE DAILY AS DIRECTED   VITAMIN B12 PO Take 1 tablet by mouth daily.   VITAMIN B6 PO Take 1 tablet by mouth daily.   VITAMIN D3 PO Take 1 capsule by mouth daily.   Xarelto   20 MG Tabs tablet Generic drug: rivaroxaban  TAKE 1 TABLET BY MOUTH EVERY DAY   zolpidem  10 MG tablet Commonly known as: AMBIEN  Take 10 mg by mouth at bedtime.        Follow-up Information     Norleen Lynwood ORN, MD. Schedule an appointment as soon as possible for a visit in 1 week(s).   Specialties: Internal Medicine, Radiology Contact information: [address] [place] [address] [phone]                Discharge Exam: Fredricka Weights   11/11/23 1629  Weight: 84.8 kg   Physical Exam Vitals reviewed.  Constitutional:      General: He is not in acute distress.    Appearance: He is not ill-appearing or toxic-appearing.  Cardiovascular:     Rate and Rhythm: Normal rate and regular rhythm.     Heart sounds: No murmur heard. Pulmonary:     Effort: Pulmonary effort is normal. No respiratory distress.     Breath sounds: No wheezing, rhonchi or rales.  Neurological:  Mental Status: He is alert.  Psychiatric:        Mood and Affect: Mood normal.        Behavior: Behavior normal.      Condition at discharge: good  The results of significant diagnostics from this hospitalization (including imaging, microbiology, ancillary and laboratory) are listed below for reference.   Imaging Studies: CT Head Wo Contrast Result Date: 11/11/2023 EXAM: CT HEAD WITHOUT CONTRAST 11/11/2023 11:04:06 AM TECHNIQUE: CT of the head was performed without the administration of intravenous contrast. Automated exposure control, iterative reconstruction, and/or weight based adjustment of the mA/kV was utilized to reduce the radiation dose to as low as reasonably achievable. COMPARISON: CT head without contrast 03/21/2023. CLINICAL HISTORY: Mental status change, unknown cause. Fall on blood thinners. FINDINGS: BRAIN AND VENTRICLES: No acute hemorrhage. No evidence of acute infarct. No hydrocephalus. No extra-axial collection. No mass effect or midline shift. Atherosclerotic calcifications  are present in the cavernous carotid arteries bilaterally. No hyperdense vessel is present. ORBITS: Bilateral lens replacements are noted. The globes and orbits are otherwise within normal limits. SINUSES: No acute abnormality. SOFT TISSUES AND SKULL: No acute soft tissue abnormality. No skull fracture. IMPRESSION: 1. No acute intracranial abnormality related to the clinical history of mental status change and fall on blood thinners. Electronically signed by: Lonni Necessary MD 11/11/2023 11:22 AM EDT RP Workstation: [id number]   DG Chest Port 1 View Result Date: 11/11/2023 EXAM: 1 VIEW XRAY OF THE CHEST 11/11/2023 10:31:00 AM COMPARISON: 03/23/2023 CLINICAL HISTORY: Weakness. Per chart: t brought in by daughter after patient was found down after a mechanical fall. Pt was down about 15 minutes before help arrived. He states that his legs gave out on him while trying to get out of bed. Pt denies syncope or LOC. FINDINGS: LUNGS AND PLEURA: Mild pulmonary vascular congestion similar to prior exam. No focal pulmonary opacity. No pulmonary edema. No pleural effusion. No pneumothorax. HEART AND MEDIASTINUM: Left chest AICD/pacemaker with leads projecting over right atrium, right ventricle, and left ventricle. Cardiomegaly, unchanged. The heart size is upper limits of normal. Aortic calcification. Neutral clip noted. BONES AND SOFT TISSUES: Multilevel thoracic osteophytosis. No acute osseous abnormality. IMPRESSION: 1. No acute findings. 2. Cardiomegaly, unchanged. 3. Mild pulmonary vascular congestion, similar to prior exam. Electronically signed by: Lonni Necessary MD 11/11/2023 11:20 AM EDT RP Workstation: [id number]    Microbiology: Results for orders placed or performed during the hospital encounter of 11/11/23  Resp panel by RT-PCR (RSV, Flu A&B, Covid) Anterior Nasal Swab     Status: Abnormal   Collection Time: 11/11/23 10:48 AM   Specimen: Anterior Nasal Swab  Result Value Ref Range Status    SARS Coronavirus 2 by RT PCR POSITIVE (A) NEGATIVE Final    Comment: (NOTE) SARS-CoV-2 target nucleic acids are DETECTED.  The SARS-CoV-2 RNA is generally detectable in upper respiratory specimens during the acute phase of infection. Positive results are indicative of the presence of the identified virus, but do not rule out bacterial infection or co-infection with other pathogens not detected by the test. Clinical correlation with patient history and other diagnostic information is necessary to determine patient infection status. The expected result is Negative.  Fact Sheet for Patients: BloggerCourse.com  Fact Sheet for Healthcare Providers: SeriousBroker.it  This test is not yet approved or cleared by the [place]  FDA and  has been authorized for detection and/or diagnosis of SARS-CoV-2 by FDA under an Emergency Use Authorization (EUA).  This EUA will remain in effect (meaning  this test can be used) for the duration of  the COVID-19 declaration under Section 564(b)(1) of the A ct, 21 U.S.C. section 360bbb-3(b)(1), unless the authorization is terminated or revoked sooner.     Influenza A by PCR NEGATIVE NEGATIVE Final   Influenza B by PCR NEGATIVE NEGATIVE Final    Comment: (NOTE) The Xpert Xpress SARS-CoV-2/FLU/RSV plus assay is intended as an aid in the diagnosis of influenza from Nasopharyngeal swab specimens and should not be used as a sole basis for treatment. Nasal washings and aspirates are unacceptable for Xpert Xpress SARS-CoV-2/FLU/RSV testing.  Fact Sheet for Patients: BloggerCourse.com  Fact Sheet for Healthcare Providers: SeriousBroker.it  This test is not yet approved or cleared by the [place]  FDA and has been authorized for detection and/or diagnosis of SARS-CoV-2 by FDA under an Emergency Use Authorization (EUA). This EUA will remain in effect  (meaning this test can be used) for the duration of the COVID-19 declaration under Section 564(b)(1) of the Act, 21 U.S.C. section 360bbb-3(b)(1), unless the authorization is terminated or revoked.     Resp Syncytial Virus by PCR NEGATIVE NEGATIVE Final    Comment: (NOTE) Fact Sheet for Patients: BloggerCourse.com  Fact Sheet for Healthcare Providers: SeriousBroker.it  This test is not yet approved or cleared by the [place]  FDA and has been authorized for detection and/or diagnosis of SARS-CoV-2 by FDA under an Emergency Use Authorization (EUA). This EUA will remain in effect (meaning this test can be used) for the duration of the COVID-19 declaration under Section 564(b)(1) of the Act, 21 U.S.C. section 360bbb-3(b)(1), unless the authorization is terminated or revoked.  Performed at [facility], [address] [place], [place] [address]     Labs: CBC: Recent Labs  Lab 11/11/23 1048 11/11/23 1049 11/12/23 0514 11/13/23 0421  WBC 7.2  --  4.7 5.2  NEUTROABS 4.9  --   --   --   HGB 14.2 14.6 13.8 14.2  HCT 42.9 43.0 42.6 42.6  MCV 89.6  --  91.4 90.3  PLT 112*  --  101* 103*   Basic Metabolic Panel: Recent Labs  Lab 11/11/23 1048 11/11/23 1049 11/12/23 0514 11/12/23 1013 11/12/23 1659 11/13/23 0421  NA 133* 133* 136 135 134* 136  K 4.6 4.5 4.1 4.3 4.0 4.2  CL 96* 98 100 100 99 101  CO2 21*  --  19* 19* 22 20*  GLUCOSE 176* 179* 160* 192* 211* 193*  BUN 24* 25* 27* 29* 28* 29*  CREATININE 1.26* 1.30* 1.17 1.13 1.15 1.00  CALCIUM  9.0  --  8.7* 8.5* 8.8* 9.0   Liver Function Tests: Recent Labs  Lab 11/11/23 1048  AST 30  ALT 20  ALKPHOS 79  BILITOT 0.5  PROT 6.9  ALBUMIN 4.1   CBG: Recent Labs  Lab 11/12/23 1650 11/12/23 2122 11/13/23 0746 11/13/23 1138 11/13/23 1607  GLUCAP 215* 267* 174* 258* 164*    Discharge time spent: less than 30 minutes.  Signed: Toribio Door, MD [facility] 11/14/2023

## 2023-11-13 NOTE — Inpatient Diabetes Management (Signed)
 Inpatient Diabetes Program Recommendations  AACE/ADA: New Consensus Statement on Inpatient Glycemic Control (2015)  Target Ranges:  Prepandial:   less than 140 mg/dL      Peak postprandial:   less than 180 mg/dL (1-2 hours)      Critically ill patients:  140 - 180 mg/dL   Lab Results  Component Value Date   GLUCAP 174 (H) 11/13/2023   HGBA1C 6.6 (A) 10/02/2023    Review of Glycemic Control  Latest Reference Range & Units 11/12/23 07:43 11/12/23 11:09 11/12/23 16:50 11/12/23 21:22 11/13/23 07:46  Glucose-Capillary 70 - 99 mg/dL 842 (H) 807 (H) 784 (H) 267 (H) 174 (H)  (H): Data is abnormally high  Diabetes history: DM2 Outpatient Diabetes medications: Jardiance  25 mg QD, Humalog  10-14 TID, Lantus  14 every day, Victoza 1.8 mg daily Current orders for Inpatient glycemic control: Lantus  14 every day, Novolog  0-15 TID and 0-5 at bedtime, Decadron  6 mg every day   Inpatient Diabetes Program Recommendations:    Novolog  3 units TID with meals if he consumes at least 50%.    Thank you, [name] Pinal, MSN, CDCES Diabetes Coordinator Inpatient Diabetes Program [phone] (team pager from 8a-5p)

## 2023-11-13 NOTE — TOC Initial Note (Signed)
 Transition of Care [facility]) - Initial/Assessment Note    Patient Details  Name: [name] MRN: [id number] Date of Birth: 1946/01/08  Transition [facility]) CM/SW Contact:    Bascom Service, RN Phone Number: 11/13/2023, 3:05 PM  Clinical Narrative: d/c plan home.                  Expected Discharge Plan: Home/Self Care Barriers to Discharge: Continued Medical Work up   Patient Goals and CMS Choice Patient states their goals for this hospitalization and ongoing recovery are:: Home CMS Medicare.gov Compare Post Acute Care list provided to:: Patient Choice offered to / list presented to : Patient [place] ownership interest in [facility].provided to:: Patient    Expected Discharge Plan and Services                                              Prior Living Arrangements/Services                       Activities of Daily Living   ADL Screening (condition at time of admission) Independently performs ADLs?: Yes (appropriate for developmental age) Is the patient deaf or have difficulty hearing?: No Does the patient have difficulty seeing, even when wearing glasses/contacts?: No Does the patient have difficulty concentrating, remembering, or making decisions?: No  Permission Sought/Granted                  Emotional Assessment              Admission diagnosis:  Acute on chronic congestive heart failure, unspecified heart failure type (HCC) [I50.9] Acute hypoxemic respiratory failure due to COVID-19 (HCC) [U07.1, J96.01] COVID-19 virus infection [U07.1] Patient Active Problem List   Diagnosis Date Noted   Generalized weakness 11/12/2023   DKA (diabetic ketoacidosis) (HCC) 11/12/2023   Hypotension 11/12/2023   Chronic combined systolic and diastolic CHF (congestive heart failure) (HCC) 11/12/2023   Thrombocytopenia (HCC) 11/12/2023   COVID-19 virus infection 11/11/2023   Rash 10/23/2023   Anal wart 06/22/2023   Penile wart  06/22/2023   Balanitis 06/22/2023   Viral pneumonia 04/11/2023   Gait disorder 04/11/2023   Paroxysmal atrial fibrillation (HCC) 03/30/2023   Acute on chronic combined systolic and diastolic heart failure (HCC) 03/27/2023   Persistent atrial fibrillation (HCC) 03/27/2023   Atypical atrial flutter (HCC) 03/27/2023   Orthostatic hypotension 03/22/2023   Rhabdomyolysis 03/21/2023   Vitamin D  deficiency 08/27/2022   Skin lesion 08/09/2021   Encounter for well adult exam with abnormal findings 08/09/2021   Head and neck lymphadenopathy 08/09/2021   Pharyngitis 08/09/2021   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) 02/11/2020   PVD (peripheral vascular disease) (HCC) 02/11/2020   Drug-induced gynecomastia 03/26/2019   Painful breasts 03/26/2019   Anxiety 03/22/2017   Back pain 10/11/2016   Acute bronchitis 12/05/2014   Allergic rhinitis 02/04/2014   Insomnia 02/04/2014   Status post ligation of left atrial appendage 12/20/2012   HTN (hypertension) 12/18/2012   Ischemic cardiomyopathy 12/18/2012   Syncope and collapse 05/15/2012   Nephrolithiasis 04/19/2012   Depression/anxiety 03/09/2012   Ventricular fibrillation (HCC) 03/09/2012   Left facial numbness 12/23/2010   Long term current use of anticoagulant therapy 04/25/2010   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/2009   CALLUS, RIGHT FOOT 12/08/2009   Type II diabetes mellitus with neurological manifestations (  HCC) 12/08/2009   SYSTOLIC HEART FAILURE, CHRONIC 11/03/2009   CATARACT EXTRACTION, HX OF 09/22/2008   Implantable cardioverter-defibrillator (ICD) in situ 09/22/2008   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   Poorly controlled type 2 diabetes mellitus with circulatory disorder (HCC) 12/05/2006   HYPOGONADISM, MALE 12/05/2006   Hyperlipidemia 11/25/2006   ISCHEMIC CARDIOMYOPATHY 11/25/2006   PAF (paroxysmal atrial fibrillation) (HCC) 11/25/2006   CAD (coronary artery disease) 11/03/2004    PCP:  Norleen Lynwood ORN, MD Pharmacy:   [facility], [place] - [address] [place] [address] Phone: [phone] Fax: [phone]  [facility] - [place], [place] - [address] [place] [address] Phone: [phone] Fax: [phone]     Social Drivers of Health (SDOH) Social History: SDOH Screenings   Food Insecurity: No Food Insecurity (11/11/2023)  Housing: Low Risk  (11/11/2023)  Transportation Needs: No Transportation Needs (11/11/2023)  Utilities: Not At Risk (11/11/2023)  Alcohol Screen: Low Risk  (06/14/2023)  Depression (PHQ2-9): Medium Risk (10/23/2023)  Financial Resource Strain: Low Risk  (06/14/2023)  Physical Activity: Inactive (06/14/2023)  Social Connections: Socially Isolated (11/11/2023)  Stress: No Stress Concern Present (06/14/2023)  Tobacco Use: Low Risk  (11/11/2023)  Health Literacy: Adequate Health Literacy (06/14/2023)   SDOH Interventions:     Readmission Risk Interventions    03/23/2023    1:14 PM  Readmission Risk Prevention Plan  Transportation Screening Complete  PCP or Specialist Appt within 5-7 Days Complete  Home Care Screening Complete  Medication Review (RN CM) Complete

## 2023-11-14 ENCOUNTER — Telehealth: Payer: Self-pay

## 2023-11-14 NOTE — Transitions of Care (Post Inpatient/ED Visit) (Signed)
   11/14/2023  Name: [name] MRN: [id number] DOB: 09-07-1945  Today's TOC FU Call Status: Today's TOC FU Call Status:: Unsuccessful Call (1st Attempt) Unsuccessful Call (1st Attempt) Date: 11/14/23  Attempted to reach the patient regarding the most recent Inpatient/ED visit. Unable to leave a voicemail message per automated system at preferred phone number listed.  Follow Up Plan: Additional outreach attempts will be made to reach the patient to complete the Transitions of Care (Post Inpatient/ED visit) call.   Richerd Fish, RN, BSN, CCM [facility], [facility] RN Care Manager Direct Dial : [phone]

## 2023-11-15 ENCOUNTER — Telehealth: Payer: Self-pay

## 2023-11-15 NOTE — Transitions of Care (Post Inpatient/ED Visit) (Signed)
 11/15/2023  Name: [name] MRN: [id number] DOB: March 10, 1945  Today's TOC FU Call Status: Today's TOC FU Call Status:: Successful TOC FU Call Completed TOC FU Call Complete Date: 11/15/23 Patient's Name and Date of Birth confirmed.  Transition Care Management Follow-up Telephone Call How have you been since you were released from the hospital?: Better (reports breathting better) Any questions or concerns?: No  Items Reviewed: Did you receive and understand the discharge instructions provided?: Yes Medications obtained,verified, and reconciled?:  (reports he has all his medications and wife manages medications.) Any new allergies since your discharge?: No Dietary orders reviewed?: Yes Type of Diet Ordered:: DM diet Do you have support at home?: Yes People in Home [RPT]: spouse  Medications Reviewed Today: Medications Reviewed Today     Reviewed by Rumalda Alan PENNER, RN (Registered Nurse) on 11/15/23 at 1404  Med List Status: <None>   Medication Order Taking? Sig Documenting Provider Last Dose Status Informant  aspirin  EC 81 MG tablet [id number] No Take 81 mg by mouth daily. Swallow whole. [provider] 11/10/2023 Active Multiple Informants  Cholecalciferol (VITAMIN D3 PO) [id number] No Take 1 capsule by mouth daily. [provider] 11/10/2023 Active Multiple Informants           Med Note (CRUTHIS, CHLOE C   Sat Nov 11, 2023  1:20 PM)    citalopram  (CELEXA ) 20 MG tablet [id number]  Take 1 tablet (20 mg total) by mouth daily. Jadine Toribio SQUIBB, MD  Active   Continuous Glucose Receiver (FREESTYLE LIBRE 2 READER) ESPIRIDION [id number]  Use as DIRECTED to check blood sugar Trixie File, MD  Active   Continuous Glucose Sensor (FREESTYLE LIBRE 2 PLUS SENSOR) MISC [id number]  Change sensor every 15 days. Trixie File, MD  Active   Continuous Glucose Sensor (FREESTYLE LIBRE 2 [place]) [place] [id number]  Apply new sensor every 14 days Trixie File, MD  Active Multiple  Informants  Cyanocobalamin  (VITAMIN B12 PO) [id number] No Take 1 tablet by mouth daily. [provider] 11/10/2023 Active Multiple Informants           Med Note (CRUTHIS, CHLOE C   Sat Nov 11, 2023  1:20 PM)    dexamethasone  (DECADRON ) 6 MG tablet [id number]  Take 1 tablet (6 mg total) by mouth daily. Jadine Toribio SQUIBB, MD  Active   dofetilide  (TIKOSYN ) 250 MCG capsule [id number] No Take 1 capsule (250 mcg total) by mouth every 12 (twelve) hours. Gherghe, Costin M, MD 11/10/2023 Active   eplerenone (INSPRA) 50 MG tablet [id number] No Take 50 mg by mouth in the morning. [provider] 11/10/2023 Active Multiple Informants  fluticasone  (FLONASE ) 50 MCG/ACT nasal spray [id number] No USE 2 SPRAYS IN EACH NOSTRIL EVERY DAY  Patient taking differently: Place 2 sprays into both nostrils daily as needed for allergies or rhinitis.   Norleen Lynwood ORN, MD Unknown Active Multiple Informants  glucose blood (ONE TOUCH ULTRA TEST) test strip [id number]  1 each by Other route 2 (two) times daily.  Patient not taking: Reported on 10/23/2023   Norleen Lynwood ORN, MD  Active   insulin  lispro (HUMALOG  [facility]) 100 UNIT/ML KwikPen [id number] No INJECT 10-14 units under skin before meals, 3 times a day, as advised  Patient taking differently: INJECT 10-14 units under skin before meals, 3 times a day, as advised   Trixie File, MD 11/10/2023 Active   Investigational - Study Medication [id number] No Take 1 tablet by mouth daily. Study name: [id number]  Additional study details: Take 1 tablet  from WHITE label bottle and 1 tablet from [facility] label bottle. [provider] 11/11/2023 Morning Active Self  ketoconazole  (NIZORAL ) 2 % cream [id number] No Apply 1 Application topically daily. Norleen Lynwood ORN, MD Past Month Active   Lancets MISC [id number]  Use as directed up to 4 times per day  Patient not taking: Reported on 10/23/2023   Norleen Lynwood ORN, MD  Active Multiple Informants  LANTUS  SOLOSTAR 100 UNIT/ML Solostar Pen  [id number] No INJECT 14 UNITS UNDER THE SKIN DAILY Trixie File, MD 11/10/2023 Active Multiple Informants  liraglutide (VICTOZA) 18 MG/3ML SOPN [id number] No Inject 1.8 mg into the skin in the morning. [provider] 11/10/2023 Active Multiple Informants  LORazepam  (ATIVAN ) 0.5 MG tablet [id number] No Take 0.5 mg by mouth every 8 (eight) hours as needed for anxiety. [provider] 11/10/2023 Active   Magnesium  Oxide (MAG-OX 400 PO) [id number] No Take 400 mg by mouth 2 (two) times daily. [provider] 11/10/2023 Active Multiple Informants  metoprolol  (TOPROL -XL) 200 MG 24 hr tablet [id number] No Take 200 mg by mouth daily after supper. [provider] 11/10/2023 Active Multiple Informants  Multiple Vitamins-Minerals (CENTRUM SILVER 50+MEN) TABS [id number] No Take 1 tablet by mouth daily with breakfast. [provider] 11/10/2023 Active Multiple Informants  NITROSTAT  0.4 MG SL tablet [id number] No Place 1 tablet (0.4 mg total) under the tongue every 5 (five) minutes as needed for chest pain. Fernande Elspeth BROCKS, MD Unknown Active Multiple Informants  Pyridoxine HCl (VITAMIN B6 PO) [id number] No Take 1 tablet by mouth daily. [provider] 11/10/2023 Active Multiple Informants           Med Note (CRUTHIS, CHLOE C   Sat Nov 11, 2023  1:20 PM)    rosuvastatin  (CRESTOR ) 20 MG tablet [id number] No TAKE 1 TABLET BY MOUTH EVERY DAY Norleen Lynwood ORN, MD 11/10/2023 Active Multiple Informants  sacubitril -valsartan  (ENTRESTO ) 24-26 MG [id number] No Take 1 tablet by mouth 2 (two) times daily. Please hold for SBP < 100 Gherghe, Costin M, MD 11/10/2023 Active   silver sulfADIAZINE (SILVADENE) 1 % cream [id number] No Apply 1 Application topically daily. [provider] 11/10/2023 Active   torsemide  (DEMADEX ) 10 MG tablet [id number] No Take 10 mg by mouth 3 (three) times a week. On Mondays, Wednesdays, Fridays [provider] 11/10/2023 Active   ULTICARE MICRO PEN  NEEDLES 32G X 4 MM MISC [id number]  USE DAILY AS DIRECTED Kassie Mallick, MD  Active   XARELTO  20 MG TABS tablet [id number] No TAKE 1 TABLET BY MOUTH EVERY DAY Fernande Elspeth BROCKS, MD 11/10/2023  8:00 PM Active Multiple Informants  zolpidem  (AMBIEN ) 10 MG tablet [id number] No Take 10 mg by mouth at bedtime. [provider] 11/10/2023 Active             Home Care and Equipment/Supplies: Were Home Health Services Ordered?: No Any new equipment or medical supplies ordered?: No  Functional Questionnaire: Do you need assistance with bathing/showering or dressing?: No Do you need assistance with meal preparation?: No Do you need assistance with eating?: No Do you have difficulty maintaining continence: No Do you need assistance with getting out of bed/getting out of a chair/moving?: No Do you have difficulty managing or taking your medications?: Yes (wife manages medications)  Follow up appointments reviewed: PCP Follow-up appointment confirmed?: Yes Date of PCP follow-up appointment?: 11/17/23 Follow-up Provider: [facility] Follow-up appointment confirmed?: No Reason Specialist Follow-Up Not Confirmed: Patient has Specialist Provider Number and will Call for Appointment Do  you need transportation to your follow-up appointment?: No Do you understand care options if your condition(s) worsen?: Yes-patient verbalized understanding  SDOH Interventions Today    Flowsheet Row Most Recent Value  SDOH Interventions   Food Insecurity Interventions Intervention Not Indicated  Housing Interventions Intervention Not Indicated  Transportation Interventions Intervention Not Indicated  Utilities Interventions Intervention Not Indicated   Patient reports that he is doing well. Reports he gets short of breath with exertion.  Reports that he weighs daily.  Reports that he follows his low salt diet. Reports that his ex wife manages all his medications and that he can not review his  medications, States that he knows that he is taking his medications as per his discharge instructions.    Patient has his followup appointments scheduled and transportation.  Patient denies any needs today. Reviewed and offered 30 day TOC program and patient declined.  Provided my contact information if patient needs any assistance.   Encouraged patient to take medication as prescribed. Reviewed importance of follow up with PCP Reviewed DM and importance of good DM control.   Alan Ee, RN, BSN, CEN [employer]- Transition of Care Team.  [facility] [phone]

## 2023-11-16 ENCOUNTER — Inpatient Hospital Stay: Admitting: Internal Medicine

## 2023-11-17 ENCOUNTER — Ambulatory Visit: Admitting: Internal Medicine

## 2023-11-17 ENCOUNTER — Encounter: Payer: Self-pay | Admitting: Internal Medicine

## 2023-11-17 VITALS — BP 100/64 | HR 76 | Temp 97.7°F | Ht 70.0 in | Wt 185.2 lb

## 2023-11-17 DIAGNOSIS — E0910 Drug or chemical induced diabetes mellitus with ketoacidosis without coma: Secondary | ICD-10-CM

## 2023-11-17 DIAGNOSIS — I10 Essential (primary) hypertension: Secondary | ICD-10-CM

## 2023-11-17 DIAGNOSIS — Z8616 Personal history of COVID-19: Secondary | ICD-10-CM | POA: Diagnosis not present

## 2023-11-17 DIAGNOSIS — Z09 Encounter for follow-up examination after completed treatment for conditions other than malignant neoplasm: Secondary | ICD-10-CM

## 2023-11-17 DIAGNOSIS — U071 COVID-19: Secondary | ICD-10-CM

## 2023-11-17 NOTE — Patient Instructions (Signed)
 You had the flu shot today  Please continue all other medications as before, and refills have been done if requested.  Please have the pharmacy call with any other refills you may need.  Please continue your efforts at being more active, low cholesterol diet, and weight control.  Please keep your appointments with your specialists as you may have planned  We can hold on more lab testing today  Please make an Appointment to return in 6 months, or sooner if needed

## 2023-11-17 NOTE — Progress Notes (Signed)
 Patient ID: [name], male   DOB: Jan 03, 1946, 78 y.o.   MRN: [id number]        Chief Complaint: follow up recent hospn with covid infection, dehydration, and euglycemia DKA on jardiance  now stopped.  Sept 16 - 19 2025, htn       HPI:  [name] is a 78 y.o. male here with c/o above, only taking celexa  at 20 mg now, seems to feel better.   Denies worsening depressive symptoms, suicidal ideation, or panic; has ongoing anxiety, prozac  stopped, celexa  now at 20 mg daily.  Finishing post covid steroid, sugars in higher 100s,  Pt denies chest pain, increased sob or doe, wheezing, orthopnea, PND, increased LE swelling, palpitations, dizziness or syncope.   Pt denies polydipsia, polyuria, or new focal neuro s/s.    Pt denies fever, wt loss, night sweats, loss of appetite, or other constitutional symptoms   Due for flu shot Wt Readings from Last 3 Encounters:  11/17/23 185 lb 3.2 oz (84 kg)  11/15/23 184 lb (83.5 kg)  11/11/23 186 lb 15.2 oz (84.8 kg)   BP Readings from Last 3 Encounters:  11/17/23 100/64  11/13/23 106/74  10/23/23 94/74         Past Medical History:  Diagnosis Date   CALLUS, RIGHT FOOT 12/08/2009   Cardiac arrest (HCC)    Chronic systolic heart failure (HCC) 11/03/2009   DIABETES MELLITUS, TYPE II 12/05/2006   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/2009   HYPERLIPIDEMIA 11/25/2006   HYPOGONADISM, MALE 12/05/2006   Implantable cardiac defibrillator MDT 09/22/2008   Change out feb 2011, 2015   Ischemic cardiomyopathy 09/22/2008   DES CX and RCA  70% residual LAD  Done in [place]; EF 30%;; myoviewq 12-Dec-2006 no ischemia   Kidney stones    Long term (current) use of anticoagulants 04/25/2010   Myocardial infarction (HCC) December 11, 2004   I died and they brought me back   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   NEPHROLITHIASIS, HX OF 02/25/2008   Persistent atrial fibrillation (HCC) 11/25/2006   Inapp shock 12-Dec-2010 AF VR >220 recurrent   Past Surgical History:  Procedure Laterality  Date   CARDIAC CATHETERIZATION     feburary 12-11-12   CARDIAC DEFIBRILLATOR PLACEMENT     Guidant Vitality [name]   CARDIOVERSION N/A 04/25/2012   Procedure: CARDIOVERSION;  Surgeon: Debby Gallop, MD;  Location: [facility] ENDOSCOPY;  Service: Cardiovascular;  Laterality: N/A;   CARDIOVERSION N/A 05/21/2012   Procedure: CARDIOVERSION;  Surgeon: Elspeth JAYSON Sage, MD;  Location: [facility] ENDOSCOPY;  Service: Cardiovascular;  Laterality: N/A;   CATARACT EXTRACTION     EYE SURGERY     cataracts   IMPLANTABLE CARDIOVERTER DEFIBRILLATOR (ICD) GENERATOR CHANGE N/A 02/05/2014   Procedure: ICD GENERATOR CHANGE;  Surgeon: Elspeth JAYSON Sage, MD;  Location: [facility] CATH LAB;  Service: Cardiovascular;  Laterality: N/A;   PTCA     TONSILLECTOMY     TONSILLECTOMY     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 He has never been exposed to tobacco smoke. He has never used smokeless tobacco. He reports current alcohol use. He reports that he does not use drugs. family history includes Breast cancer in his mother; Diabetes in his maternal grandmother; Hyperlipidemia in his maternal grandmother. Allergies  Allergen Reactions   Salmon [Fish Allergy] Other (See Comments)  Only canned/ not fresh (probably preservatives)   Sulfa  Antibiotics Other (See Comments)    Not sure he has an allergy to this (??)   Jardiance  [Empagliflozin ] Other (See Comments)    Euglycemic DKA   Metformin  And Related Diarrhea   Current Outpatient Medications on File Prior to Visit  Medication Sig Dispense Refill   aspirin  EC 81 MG tablet Take 81 mg by mouth daily. Swallow whole.     Cholecalciferol (VITAMIN D3 PO) Take 1 capsule by mouth daily.     citalopram  (CELEXA ) 20 MG tablet Take 1 tablet (20 mg total) by mouth daily. 90 tablet 0   Continuous Glucose Receiver (FREESTYLE  LIBRE 2 READER) DEVI Use as DIRECTED to check blood sugar 1 each 0   Continuous Glucose Sensor (FREESTYLE LIBRE 2 PLUS SENSOR) MISC Change sensor every 15 days. 6 each 1   Continuous Glucose Sensor (FREESTYLE LIBRE 2 SENSOR) MISC Apply new sensor every 14 days 6 each 3   Cyanocobalamin  (VITAMIN B12 PO) Take 1 tablet by mouth daily.     dexamethasone  (DECADRON ) 6 MG tablet Take 1 tablet (6 mg total) by mouth daily. 8 tablet 0   dofetilide  (TIKOSYN ) 250 MCG capsule Take 1 capsule (250 mcg total) by mouth every 12 (twelve) hours.     eplerenone (INSPRA) 50 MG tablet Take 50 mg by mouth in the morning.     fluticasone  (FLONASE ) 50 MCG/ACT nasal spray USE 2 SPRAYS IN EACH NOSTRIL EVERY DAY (Patient taking differently: Place 2 sprays into both nostrils daily as needed for allergies or rhinitis.) 16 g 5   insulin  lispro (HUMALOG  KWIKPEN) 100 UNIT/ML KwikPen INJECT 10-14 units under skin before meals, 3 times a day, as advised (Patient taking differently: INJECT 10-14 units under skin before meals, 3 times a day, as advised) 15 mL 1   Investigational - Study Medication Take 1 tablet by mouth daily. Study name: [id number]  Additional study details: Take 1 tablet from WHITE label bottle and 1 tablet from Blue label bottle.     ketoconazole  (NIZORAL ) 2 % cream Apply 1 Application topically daily. 30 g 2   LANTUS  SOLOSTAR 100 UNIT/ML Solostar Pen INJECT 14 UNITS UNDER THE SKIN DAILY 15 mL 11   liraglutide (VICTOZA) 18 MG/3ML SOPN Inject 1.8 mg into the skin in the morning.     LORazepam  (ATIVAN ) 0.5 MG tablet Take 0.5 mg by mouth every 8 (eight) hours as needed for anxiety.     Magnesium  Oxide (MAG-OX 400 PO) Take 400 mg by mouth 2 (two) times daily.     metoprolol  (TOPROL -XL) 200 MG 24 hr tablet Take 200 mg by mouth daily after supper.     Multiple Vitamins-Minerals (CENTRUM SILVER 50+MEN) TABS Take 1 tablet by mouth daily with breakfast.     NITROSTAT  0.4 MG SL tablet Place 1 tablet (0.4 mg total) under the  tongue every 5 (five) minutes as needed for chest pain. 25 tablet 5   Pyridoxine HCl (VITAMIN B6 PO) Take 1 tablet by mouth daily.     rosuvastatin  (CRESTOR ) 20 MG tablet TAKE 1 TABLET BY MOUTH EVERY DAY 90 tablet 0   sacubitril -valsartan  (ENTRESTO ) 24-26 MG Take 1 tablet by mouth 2 (two) times daily. Please hold for SBP < 100     silver sulfADIAZINE (SILVADENE) 1 % cream Apply 1 Application topically daily.     torsemide  (DEMADEX ) 10 MG tablet Take 10 mg by mouth 3 (three) times a week. On Mondays, Wednesdays, Fridays  ULTICARE MICRO PEN NEEDLES 32G X 4 MM MISC USE DAILY AS DIRECTED 100 each 3   XARELTO  20 MG TABS tablet TAKE 1 TABLET BY MOUTH EVERY DAY 30 tablet 5   zolpidem  (AMBIEN ) 10 MG tablet Take 10 mg by mouth at bedtime.     glucose blood (ONE TOUCH ULTRA TEST) test strip 1 each by Other route 2 (two) times daily. (Patient not taking: Reported on 11/17/2023) 200 each 2   Lancets MISC Use as directed up to 4 times per day (Patient not taking: Reported on 11/17/2023) 100 each 11   No current facility-administered medications on file prior to visit.        ROS:  All others reviewed and negative.  Objective        PE:  BP 100/64   Pulse 76   Temp 97.7 F (36.5 C)   Ht 5' 10 (1.778 m)   Wt 185 lb 3.2 oz (84 kg)   SpO2 99%   BMI 26.57 kg/m                 Constitutional: Pt appears in NAD               HENT: Head: NCAT.                Right Ear: External ear normal.                 Left Ear: External ear normal.                Eyes: . Pupils are equal, round, and reactive to light. Conjunctivae and EOM are normal               Nose: without d/c or deformity               Neck: Neck supple. Gross normal ROM               Cardiovascular: Normal rate and regular rhythm.                 Pulmonary/Chest: Effort normal and breath sounds without rales or wheezing.                Abd:  Soft, NT, ND, + BS, no organomegaly               Neurological: Pt is alert. At baseline  orientation, motor grossly intact               Skin: Skin is warm. No rashes, no other new lesions, LE edema - none               Psychiatric: Pt behavior is normal without agitation   Micro: none  Cardiac tracings I have personally interpreted today:  none  Pertinent Radiological findings (summarize): none   Lab Results  Component Value Date   WBC 5.2 11/13/2023   HGB 14.2 11/13/2023   HCT 42.6 11/13/2023   PLT 103 (L) 11/13/2023   GLUCOSE 193 (H) 11/13/2023   CHOL 122 08/24/2022   TRIG 270.0 (H) 08/24/2022   HDL 35.60 (L) 08/24/2022   LDLDIRECT 63.0 08/24/2022   LDLCALC 60 08/09/2021   ALT 20 11/11/2023   AST 30 11/11/2023   NA 136 11/13/2023   K 4.2 11/13/2023   CL 101 11/13/2023   CREATININE 1.00 11/13/2023   BUN 29 (H) 11/13/2023   CO2 20 (L) 11/13/2023   TSH 4.71 08/24/2022   PSA 0.65 10/23/2023  INR 2.0 (H) 03/21/2023   HGBA1C 6.6 (A) 10/02/2023   MICROALBUR 0.9 10/02/2023   Assessment/Plan:  [name] is a 78 y.o. 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  HTN (hypertension) BP Readings from Last 3 Encounters:  11/17/23 100/64  11/13/23 106/74  10/23/23 94/74   Stable, pt to continue medical treatment toprol  xl 200 mg every day   DKA (diabetic ketoacidosis) (HCC) Mild euglycemic with dehydration, agree with d/c jardiance  and place on allergy list  COVID-19  virus infection Clinically resolved, no further tx needed,  to f/u any worsening symptoms or concerns  Followup: Return in about 6 months (around 05/16/2024).  Lynwood Rush, MD 11/18/2023 3:25 PM [facility] Internal Medicine

## 2023-11-18 ENCOUNTER — Encounter: Payer: Self-pay | Admitting: Internal Medicine

## 2023-11-18 NOTE — Assessment & Plan Note (Signed)
 Clinically resolved, no further tx needed,  to f/u any worsening symptoms or concerns

## 2023-11-18 NOTE — Assessment & Plan Note (Signed)
 Mild euglycemic with dehydration, agree with d/c jardiance  and place on allergy list

## 2023-11-18 NOTE — Assessment & Plan Note (Signed)
 BP Readings from Last 3 Encounters:  11/17/23 100/64  11/13/23 106/74  10/23/23 94/74   Stable, pt to continue medical treatment toprol  xl 200 mg every day

## 2023-12-12 ENCOUNTER — Other Ambulatory Visit: Payer: Self-pay | Admitting: Internal Medicine

## 2023-12-12 DIAGNOSIS — E0842 Diabetes mellitus due to underlying condition with diabetic polyneuropathy: Secondary | ICD-10-CM

## 2023-12-13 ENCOUNTER — Other Ambulatory Visit: Payer: Self-pay

## 2023-12-13 DIAGNOSIS — K080 Exfoliation of teeth due to systemic causes: Secondary | ICD-10-CM | POA: Diagnosis not present

## 2023-12-13 DIAGNOSIS — E0800 Diabetes mellitus due to underlying condition with hyperosmolarity without nonketotic hyperglycemic-hyperosmolar coma (NKHHC): Secondary | ICD-10-CM

## 2023-12-13 MED ORDER — INSULIN LISPRO (1 UNIT DIAL) 100 UNIT/ML (KWIKPEN): 10.0000 [IU] | PEN_INJECTOR | Freq: Three times a day (TID) | SUBCUTANEOUS | 2 refills | Status: DC

## 2023-12-14 MED ORDER — INSULIN LISPRO (1 UNIT DIAL) 100 UNIT/ML (KWIKPEN): 10.0000 [IU] | PEN_INJECTOR | Freq: Three times a day (TID) | SUBCUTANEOUS | 2 refills | Status: AC

## 2023-12-14 NOTE — Addendum Note (Signed)
 Addended by: CLEOTILDE ROLIN RAMAN on: 12/14/2023 01:32 PM   Modules accepted: Orders

## 2023-12-21 DIAGNOSIS — L97411 Non-pressure chronic ulcer of right heel and midfoot limited to breakdown of skin: Secondary | ICD-10-CM | POA: Diagnosis not present

## 2023-12-25 DIAGNOSIS — H04123 Dry eye syndrome of bilateral lacrimal glands: Secondary | ICD-10-CM | POA: Diagnosis not present

## 2023-12-25 DIAGNOSIS — E113393 Type 2 diabetes mellitus with moderate nonproliferative diabetic retinopathy without macular edema, bilateral: Secondary | ICD-10-CM | POA: Diagnosis not present

## 2023-12-25 DIAGNOSIS — Z961 Presence of intraocular lens: Secondary | ICD-10-CM | POA: Diagnosis not present

## 2023-12-25 LAB — OPHTHALMOLOGY REPORT-SCANNED

## 2023-12-26 ENCOUNTER — Encounter (HOSPITAL_BASED_OUTPATIENT_CLINIC_OR_DEPARTMENT_OTHER): Attending: General Surgery | Admitting: General Surgery

## 2023-12-26 DIAGNOSIS — Z794 Long term (current) use of insulin: Secondary | ICD-10-CM | POA: Diagnosis not present

## 2023-12-26 DIAGNOSIS — L97512 Non-pressure chronic ulcer of other part of right foot with fat layer exposed: Secondary | ICD-10-CM | POA: Insufficient documentation

## 2023-12-26 DIAGNOSIS — E11621 Type 2 diabetes mellitus with foot ulcer: Secondary | ICD-10-CM | POA: Diagnosis not present

## 2023-12-26 DIAGNOSIS — E1151 Type 2 diabetes mellitus with diabetic peripheral angiopathy without gangrene: Secondary | ICD-10-CM | POA: Diagnosis not present

## 2024-01-02 ENCOUNTER — Encounter (HOSPITAL_BASED_OUTPATIENT_CLINIC_OR_DEPARTMENT_OTHER): Attending: General Surgery | Admitting: General Surgery

## 2024-01-02 DIAGNOSIS — E11621 Type 2 diabetes mellitus with foot ulcer: Secondary | ICD-10-CM | POA: Insufficient documentation

## 2024-01-02 DIAGNOSIS — Z89431 Acquired absence of right foot: Secondary | ICD-10-CM | POA: Diagnosis not present

## 2024-01-02 DIAGNOSIS — L97512 Non-pressure chronic ulcer of other part of right foot with fat layer exposed: Secondary | ICD-10-CM | POA: Diagnosis not present

## 2024-01-02 DIAGNOSIS — E1151 Type 2 diabetes mellitus with diabetic peripheral angiopathy without gangrene: Secondary | ICD-10-CM | POA: Insufficient documentation

## 2024-01-10 ENCOUNTER — Encounter (HOSPITAL_BASED_OUTPATIENT_CLINIC_OR_DEPARTMENT_OTHER): Admitting: General Surgery

## 2024-01-10 DIAGNOSIS — E11621 Type 2 diabetes mellitus with foot ulcer: Secondary | ICD-10-CM | POA: Diagnosis not present

## 2024-01-10 DIAGNOSIS — E1151 Type 2 diabetes mellitus with diabetic peripheral angiopathy without gangrene: Secondary | ICD-10-CM | POA: Diagnosis not present

## 2024-01-10 DIAGNOSIS — L97512 Non-pressure chronic ulcer of other part of right foot with fat layer exposed: Secondary | ICD-10-CM | POA: Diagnosis not present

## 2024-01-10 DIAGNOSIS — Z89431 Acquired absence of right foot: Secondary | ICD-10-CM | POA: Diagnosis not present

## 2024-01-17 ENCOUNTER — Encounter (HOSPITAL_BASED_OUTPATIENT_CLINIC_OR_DEPARTMENT_OTHER): Admitting: General Surgery

## 2024-01-17 DIAGNOSIS — Z89431 Acquired absence of right foot: Secondary | ICD-10-CM | POA: Diagnosis not present

## 2024-01-17 DIAGNOSIS — L97512 Non-pressure chronic ulcer of other part of right foot with fat layer exposed: Secondary | ICD-10-CM | POA: Diagnosis not present

## 2024-01-17 DIAGNOSIS — E1151 Type 2 diabetes mellitus with diabetic peripheral angiopathy without gangrene: Secondary | ICD-10-CM | POA: Diagnosis not present

## 2024-01-17 DIAGNOSIS — E11621 Type 2 diabetes mellitus with foot ulcer: Secondary | ICD-10-CM | POA: Diagnosis not present

## 2024-01-22 DIAGNOSIS — K080 Exfoliation of teeth due to systemic causes: Secondary | ICD-10-CM | POA: Diagnosis not present

## 2024-01-24 ENCOUNTER — Encounter (HOSPITAL_BASED_OUTPATIENT_CLINIC_OR_DEPARTMENT_OTHER): Admitting: General Surgery

## 2024-01-24 DIAGNOSIS — E1151 Type 2 diabetes mellitus with diabetic peripheral angiopathy without gangrene: Secondary | ICD-10-CM | POA: Diagnosis not present

## 2024-01-24 DIAGNOSIS — L97512 Non-pressure chronic ulcer of other part of right foot with fat layer exposed: Secondary | ICD-10-CM | POA: Diagnosis not present

## 2024-01-24 DIAGNOSIS — Z89431 Acquired absence of right foot: Secondary | ICD-10-CM | POA: Diagnosis not present

## 2024-01-24 DIAGNOSIS — E11621 Type 2 diabetes mellitus with foot ulcer: Secondary | ICD-10-CM | POA: Diagnosis not present

## 2024-01-31 ENCOUNTER — Encounter (HOSPITAL_BASED_OUTPATIENT_CLINIC_OR_DEPARTMENT_OTHER): Admitting: General Surgery

## 2024-01-31 DIAGNOSIS — I739 Peripheral vascular disease, unspecified: Secondary | ICD-10-CM | POA: Diagnosis not present

## 2024-01-31 DIAGNOSIS — E11621 Type 2 diabetes mellitus with foot ulcer: Secondary | ICD-10-CM | POA: Insufficient documentation

## 2024-01-31 DIAGNOSIS — L97512 Non-pressure chronic ulcer of other part of right foot with fat layer exposed: Secondary | ICD-10-CM | POA: Insufficient documentation

## 2024-02-05 ENCOUNTER — Encounter: Payer: Self-pay | Admitting: Internal Medicine

## 2024-02-05 ENCOUNTER — Other Ambulatory Visit

## 2024-02-05 ENCOUNTER — Ambulatory Visit: Admitting: Internal Medicine

## 2024-02-05 VITALS — BP 112/64 | HR 91 | Ht 70.0 in | Wt 192.8 lb

## 2024-02-05 DIAGNOSIS — Z7985 Long-term (current) use of injectable non-insulin antidiabetic drugs: Secondary | ICD-10-CM

## 2024-02-05 DIAGNOSIS — E785 Hyperlipidemia, unspecified: Secondary | ICD-10-CM | POA: Diagnosis not present

## 2024-02-05 DIAGNOSIS — Z794 Long term (current) use of insulin: Secondary | ICD-10-CM | POA: Diagnosis not present

## 2024-02-05 DIAGNOSIS — E1159 Type 2 diabetes mellitus with other circulatory complications: Secondary | ICD-10-CM

## 2024-02-05 DIAGNOSIS — E1165 Type 2 diabetes mellitus with hyperglycemia: Secondary | ICD-10-CM

## 2024-02-05 DIAGNOSIS — E0842 Diabetes mellitus due to underlying condition with diabetic polyneuropathy: Secondary | ICD-10-CM

## 2024-02-05 LAB — POCT GLYCOSYLATED HEMOGLOBIN (HGB A1C): Hemoglobin A1C: 7.2 % — AB (ref 4.0–5.6)

## 2024-02-05 MED ORDER — LIRAGLUTIDE 18 MG/3ML ~~LOC~~ SOPN: 1.8000 mg | PEN_INJECTOR | Freq: Every morning | SUBCUTANEOUS | 3 refills | Status: AC

## 2024-02-05 MED ORDER — LANTUS SOLOSTAR 100 UNIT/ML ~~LOC~~ SOPN: 14.0000 [IU] | PEN_INJECTOR | Freq: Every day | SUBCUTANEOUS | 3 refills | Status: DC

## 2024-02-05 NOTE — Progress Notes (Signed)
 Patient ID: [name], male   DOB: Apr 30, 1945, 78 y.o.   MRN: [id number]  HPI: [name] is a 78 y.o.-year-old male, returning for follow-up for DM2, dx in 26-Mar-2007, insulin -dependent since 2017/03/25, uncontrolled, with complications (CAD, CHF, CKD, PN, s/p trans metatarsal amputation, DR). Pt. previously saw Dr. Kassie, but last visit with me visit 4 months ago.    Interim history: No increased urination, nausea, chest pain.  He does have some blurry vision. He was admitted for COVID-19 in 10/2023. At that time, he had euglycemic DKA on Jardiance  >> stopped since. He had a defibrillator placed since last OV.  Reviewed HbA1c: Lab Results  Component Value Date   HGBA1C 6.6 (A) 10/02/2023   HGBA1C 6.3 (A) 06/01/2023   HGBA1C 7.2 (A) 11/21/2022   HGBA1C 7.3 (H) 08/24/2022   HGBA1C 6.9 (A) 07/21/2022   HGBA1C 6.6 (A) 04/01/2022   HGBA1C 6.7 (A) 12/17/2021   HGBA1C 7.6 (H) 08/09/2021   HGBA1C 7.1 (A) 06/15/2021   HGBA1C 6.9 (A) 03/09/2021  06/15/2023: HbA1c 6.4%  Pt is on a regimen of: - Lantus  14 units in am - Humalog  01-08-09 units before or after meals >> (10)-(10)-(5-9) >> 10 units 15 min before the 3 meals >> 10-12 units before each meal - Victoza  1.8 mg at night  >> in amMetformin caused nausea. He declines a V-Go device due to cost. He was taken off Jardiance  10/2023 due to concern of DKA - prev. started by cardiology.  Pt checks his sugars >4x a day - Libre CGM (receiver):  Prev.:  Previously:  Lowest sugar was 40s >> .SABRA. 60s >> 60s; he has hypoglycemia awareness at 70.  Highest sugar was 300s >> 200s.  Glucometer:?  - + CKD, last BUN/creatinine:  01/23/2024: 14/1.01, GFR 77, glucose 210 Lab Results  Component Value Date   BUN 29 (H) 11/13/2023   BUN 28 (H) 11/12/2023   CREATININE 1.00 11/13/2023   CREATININE 1.15 11/12/2023  07/20/2022: 14/0.94, GFR 85  Lab Results  Component Value Date   MICRALBCREAT 33 (H) 10/02/2023  She is on valsartan .  -+ HL; last set  of lipids:  Lab Results  Component Value Date   CHOL 122 08/24/2022   HDL 35.60 (L) 08/24/2022   LDLCALC 60 08/09/2021   LDLDIRECT 63.0 08/24/2022   TRIG 270.0 (H) 08/24/2022   CHOLHDL 3 08/24/2022  On Crestor  20 mg daily.  - last eye exam was 12/25/2023: + DR  - no numbness and tingling in his feet.  Last foot exam 01/04/2024 by [facility] (Dr. Toribio Hone).  He has a history of nephrolithiasis, hypogonadism. He was in a clinical trial  - a supplement that helps with heart condition >> improves stamina and SOB.  ROS: + see HPI  Past Medical History:  Diagnosis Date   CALLUS, RIGHT FOOT 12/08/2009   Cardiac arrest [facility])    Chronic systolic heart failure (HCC) 11/03/2009   DIABETES MELLITUS, TYPE II 12/05/2006   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/2009   HYPERLIPIDEMIA 11/25/2006   HYPOGONADISM, MALE 12/05/2006   Implantable cardiac defibrillator MDT 09/22/2008   Change out feb 2011, 2015   Ischemic cardiomyopathy 09/22/2008   DES CX and RCA  70% residual LAD  Done in [place]; EF 30%;; myoviewq 03/26/2007 no ischemia   Kidney stones    Long term (current) use of anticoagulants 04/25/2010   Myocardial infarction (HCC) 2005/03/25   I died and they brought me back   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   NEPHROLITHIASIS, HX OF 02/25/2008   Persistent atrial fibrillation (HCC) 11/25/2006   Inapp shock 2012 AF VR >220 recurrent   Past Surgical History:  Procedure Laterality Date   CARDIAC CATHETERIZATION     feburary 2014   CARDIAC DEFIBRILLATOR PLACEMENT     Guidant Vitality [name]   CARDIOVERSION N/A 04/25/2012   Procedure: CARDIOVERSION;  Surgeon: Debby Gallop, MD;  Location: [facility] ENDOSCOPY;  Service: Cardiovascular;  Laterality: N/A;   CARDIOVERSION N/A 05/21/2012   Procedure: CARDIOVERSION;  Surgeon: Elspeth JAYSON Sage, MD;  Location: [facility] ENDOSCOPY;  Service: Cardiovascular;  Laterality: N/A;   CATARACT EXTRACTION     EYE SURGERY     cataracts   IMPLANTABLE CARDIOVERTER  DEFIBRILLATOR (ICD) GENERATOR CHANGE N/A 02/05/2014   Procedure: ICD GENERATOR CHANGE;  Surgeon: Elspeth JAYSON Sage, MD;  Location: [facility] CATH LAB;  Service: Cardiovascular;  Laterality: N/A;   PTCA     TONSILLECTOMY     TONSILLECTOMY     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   Procedure: TRANSMETATARSAL AMPUTATION RIGHT;  Surgeon: Gretta Lonni PARAS, MD;  Location: [facility] OR;  Service: Vascular;  Laterality: Right;   Social History   Socioeconomic History   Marital status: Divorced    Spouse name: Not on file   Number of children: 1   Years of education: 16   Highest education level: Not on file  Occupational History   Occupation: Musician business and show production    Employer: [employer]  Tobacco Use   Smoking status: Never    Passive exposure: Never   Smokeless tobacco: Never  Vaping Use   Vaping status: Never Used  Substance and Sexual Activity   Alcohol use: Yes    Alcohol/week: 0.0 standard drinks of alcohol    Comment: summer    Drug use: No   Sexual activity: Not Currently    Partners: Female  Other Topics Concern   Not on file  Social History Narrative   HSG, College grad. Married - divorced. 1 daughter. owner/operator optician, dispensing business and show production   Social Drivers of Health   Financial Resource Strain: Low Risk  (06/14/2023)   Overall Financial Resource Strain (CARDIA)    Difficulty of Paying Living Expenses: Not hard at all  Food Insecurity: No Food Insecurity (12/26/2023)   Received from [facility]   Hunger Vital Sign    Within the past 12 months, you worried that your food would run out before you got the money to buy more.: Never true    Within the past 12 months, the food you bought just didn't last and you didn't have money to get more.: Never true  Transportation Needs: No Transportation Needs (12/26/2023)   Received from [facility] - Transportation    Lack of Transportation (Medical): No    Lack  of Transportation (Non-Medical): No  Physical Activity: Inactive (06/14/2023)   Exercise Vital Sign    Days of Exercise per Week: 0 days    Minutes of Exercise per Session: 0 min  Stress: No Stress Concern Present (06/14/2023)   [employer] of Occupational [employer] - Occupational Stress Questionnaire    Feeling of Stress : Not at all  Social Connections: Socially Isolated (11/11/2023)   Social Connection and Isolation Panel    Frequency of Communication with Friends and Family: More than three times a week    Frequency of Social Gatherings with Friends and Family: More than three times a  week    Attends Religious Services: Never    Active Member of Clubs or Organizations: No    Attends Banker Meetings: Never    Marital Status: Divorced  Catering Manager Violence: Not At Risk (12/26/2023)   Received from [facility]   Humiliation, Afraid, Rape, and Kick questionnaire    Within the last year, have you been afraid of your partner or ex-partner?: No    Within the last year, have you been humiliated or emotionally abused in other ways by your partner or ex-partner?: No    Within the last year, have you been kicked, hit, slapped, or otherwise physically hurt by your partner or ex-partner?: No    Within the last year, have you been raped or forced to have any kind of sexual activity by your partner or ex-partner?: No   Current Outpatient Medications on File Prior to Visit  Medication Sig Dispense Refill   aspirin  EC 81 MG tablet Take 81 mg by mouth daily. Swallow whole.     Cholecalciferol (VITAMIN D3 PO) Take 1 capsule by mouth daily.     citalopram  (CELEXA ) 20 MG tablet Take 1 tablet (20 mg total) by mouth daily. 90 tablet 0   Continuous Glucose Receiver (FREESTYLE LIBRE 2 READER) DEVI Use as DIRECTED to check blood sugar 1 each 0   Continuous Glucose Sensor (FREESTYLE LIBRE 2 PLUS SENSOR) MISC Change sensor every 15 days. 6 each 1   Continuous Glucose Sensor (FREESTYLE  LIBRE 2 SENSOR) MISC Apply new sensor every 14 days 6 each 3   Cyanocobalamin  (VITAMIN B12 PO) Take 1 tablet by mouth daily.     dexamethasone  (DECADRON ) 6 MG tablet Take 1 tablet (6 mg total) by mouth daily. 8 tablet 0   dofetilide  (TIKOSYN ) 250 MCG capsule Take 1 capsule (250 mcg total) by mouth every 12 (twelve) hours.     eplerenone (INSPRA) 50 MG tablet Take 50 mg by mouth in the morning.     fluticasone  (FLONASE ) 50 MCG/ACT nasal spray USE 2 SPRAYS IN EACH NOSTRIL EVERY DAY (Patient taking differently: Place 2 sprays into both nostrils daily as needed for allergies or rhinitis.) 16 g 5   glucose blood (ONE TOUCH ULTRA TEST) test strip 1 each by Other route 2 (two) times daily. (Patient not taking: Reported on 11/17/2023) 200 each 2   insulin  lispro (HUMALOG  KWIKPEN) 100 UNIT/ML KwikPen Inject 10-15 Units into the skin 3 (three) times daily. 45 mL 2   Investigational - Study Medication Take 1 tablet by mouth daily. Study name: [id number]  Additional study details: Take 1 tablet from WHITE label bottle and 1 tablet from Blue label bottle.     ketoconazole  (NIZORAL ) 2 % cream Apply 1 Application topically daily. 30 g 2   Lancets MISC Use as directed up to 4 times per day (Patient not taking: Reported on 11/17/2023) 100 each 11   LANTUS  SOLOSTAR 100 UNIT/ML Solostar Pen INJECT 14 UNITS UNDER THE SKIN DAILY 15 mL 11   liraglutide  (VICTOZA ) 18 MG/3ML SOPN Inject 1.8 mg into the skin in the morning.     LORazepam  (ATIVAN ) 0.5 MG tablet Take 0.5 mg by mouth every 8 (eight) hours as needed for anxiety.     Magnesium  Oxide (MAG-OX 400 PO) Take 400 mg by mouth 2 (two) times daily.     metoprolol  (TOPROL -XL) 200 MG 24 hr tablet Take 200 mg by mouth daily after supper.     Multiple Vitamins-Minerals (CENTRUM SILVER 50+MEN) TABS  Take 1 tablet by mouth daily with breakfast.     NITROSTAT  0.4 MG SL tablet Place 1 tablet (0.4 mg total) under the tongue every 5 (five) minutes as needed for chest pain. 25 tablet 5    Pyridoxine HCl (VITAMIN B6 PO) Take 1 tablet by mouth daily.     rosuvastatin  (CRESTOR ) 20 MG tablet TAKE 1 TABLET BY MOUTH EVERY DAY 90 tablet 0   sacubitril -valsartan  (ENTRESTO ) 24-26 MG Take 1 tablet by mouth 2 (two) times daily. Please hold for SBP < 100     silver sulfADIAZINE (SILVADENE) 1 % cream Apply 1 Application topically daily.     torsemide  (DEMADEX ) 10 MG tablet Take 10 mg by mouth 3 (three) times a week. On Mondays, Wednesdays, Fridays     ULTICARE MICRO PEN NEEDLES 32G X 4 MM MISC USE DAILY AS DIRECTED 100 each 3   XARELTO  20 MG TABS tablet TAKE 1 TABLET BY MOUTH EVERY DAY 30 tablet 5   zolpidem  (AMBIEN ) 10 MG tablet Take 10 mg by mouth at bedtime.     No current facility-administered medications on file prior to visit.   Allergies  Allergen Reactions   Salmon [Fish Allergy] Other (See Comments)    Only canned/ not fresh (probably preservatives)   Sulfa  Antibiotics Other (See Comments)    Not sure he has an allergy to this (??)   Jardiance  [Empagliflozin ] Other (See Comments)    Euglycemic DKA   Metformin  And Related Diarrhea   Family History  Problem Relation Age of Onset   Diabetes Maternal Grandmother    Hyperlipidemia Maternal Grandmother    Breast cancer Mother    Cancer Neg Hx    COPD Neg Hx    Heart disease Neg Hx    PE: BP 112/64   Pulse 91   Ht 5' 10 (1.778 m)   Wt 192 lb 12.8 oz (87.5 kg)   SpO2 97%   BMI 27.66 kg/m  Wt Readings from Last 3 Encounters:  02/05/24 192 lb 12.8 oz (87.5 kg)  11/17/23 185 lb 3.2 oz (84 kg)  11/15/23 184 lb (83.5 kg)   Constitutional: overweight, in NAD Eyes: no exophthalmos ENT: no thyromegaly, no cervical lymphadenopathy Cardiovascular: RRR, No MRG Respiratory: CTA B Musculoskeletal: + deformities: Transmetatarsal amputation Skin: no rashes Neurological: no tremor with outstretched hands  ASSESSMENT: 1. DM2, insulin -dependent, uncontrolled, with complications - CAD, s/p AMI - ICM, with CHF, s/p ICD -  Persistent A-fib - CKD - PN - Transmetatarsal amputation - DR  2. HL  PLAN:   1. Patient with longstanding, uncontrolled, type 2 diabetes, on injectable antidiabetic regimen with daily GLP-1 receptor agonist, basal-bolus insulin , with a slightly worse HbA1c at last visit, at 6.6%.  Sugars appear to be still fluctuating within the target range at that time but we had loss of signal in the evening.  He did have occasional hyperglycemic spikes but these were not consistent.  We discussed about lowering the dose of Humalog  and not taking the same dose before every meal, but otherwise I did not suggest a change in regimen. CGM interpretation: -At today's visit, we reviewed his CGM downloads: It appears that 75% of values are in target range (goal >70%), while 25% are higher than 180 (goal <25%), and 0% are lower than 70 (goal <4%).  The calculated average blood sugar is 160.  The projected HbA1c for the next 3 months (GMI) is sugars appear to be fluctuating high in the target range with hyperglycemic excursions  after each meal.  Upon reviewing individual daily tracings, it appears that he is forgetting insulin  boluses and occasionally taking them after the meals, with subsequent early postprandial hyperglycemia and late postprandial improvement in blood sugars to the point of hypoglycemia.  We discussed about the absolute importance of bolusing Humalog  15 minutes before each meal, but to take only half of the amount if he has to take it after the meal. -At today's visit his wife tells me that he was recently admitted for COVID-19 infection but at that time, he actually had euglycemic DKA and was taken off Jardiance .  I was not aware he was on Jardiance  and they mentioned that this was started by his cardiologist at [facility].  At today's visit, will check his insulin  production and antipancreatic antibodies and I advised him to stay off Jardiance  for now. - I suggested to:  Patient Instructions   Please continue: - Victoza  1.8 mg before breakfast - Lantus  14 units in am - Humalog  (15 min before meals): 10-12 units before meals If you take Humalog  after a meal, may need to reduce the dose to 5-6 units.  Please return in 4 months.  - we checked his HbA1c: 7.2% (higher) - advised to check sugars at different times of the day - 4x a day, rotating check times - advised for yearly eye exams >> he is UTD -He had a slightly elevated ACR at last visit, at 33.  He continues valsartan .  Will recheck his ACR today. - return to clinic in 4 months  2. HL - Reviewed latest lipid panel from 12/2022: LDL at goal, HDL low, triglycerides high - Continues Crestor  20 mg daily without side effects. - will check annual lipid panel today  Orders Placed This Encounter  Procedures   Microalbumin / creatinine urine ratio   Lipid Panel w/reflex Direct LDL   ZNT8 Antibodies   Glutamic acid decarboxylase auto abs   C-peptide   Glucose, fasting   IA-2 Antibody   POCT glycosylated hemoglobin (Hb A1C)   Lela Fendt, MD PhD [facility]

## 2024-02-05 NOTE — Patient Instructions (Signed)
 Please continue: - Victoza 1.8 mg before breakfast - Lantus 14 units in am - Humalog (15 min before meals): 10-12 units before meals If you take Humalog after a meal, may need to reduce the dose to 5-6 units.  Please return in 4 months.

## 2024-02-06 LAB — LIPID PANEL W/REFLEX DIRECT LDL
Cholesterol: 128 mg/dL (ref <200)
HDL: 32 mg/dL — ABNORMAL LOW (ref >=40)
LDL Cholesterol (Calc): 67 mg/dL
Non-HDL Cholesterol (Calc): 96 mg/dL (ref <130)
Total CHOL/HDL Ratio: 4 (calc) (ref <5.0)
Triglycerides: 235 mg/dL — ABNORMAL HIGH (ref <150)

## 2024-02-06 LAB — MICROALBUMIN / CREATININE URINE RATIO
Creatinine, Urine: 144 mg/dL (ref 20–320)
Microalb Creat Ratio: 15 mg/g{creat} (ref <30)
Microalb, Ur: 2.2 mg/dL

## 2024-02-07 ENCOUNTER — Encounter (HOSPITAL_BASED_OUTPATIENT_CLINIC_OR_DEPARTMENT_OTHER): Admitting: Internal Medicine

## 2024-02-07 DIAGNOSIS — L97512 Non-pressure chronic ulcer of other part of right foot with fat layer exposed: Secondary | ICD-10-CM | POA: Diagnosis not present

## 2024-02-07 DIAGNOSIS — E11621 Type 2 diabetes mellitus with foot ulcer: Secondary | ICD-10-CM | POA: Diagnosis not present

## 2024-02-07 DIAGNOSIS — E1159 Type 2 diabetes mellitus with other circulatory complications: Secondary | ICD-10-CM

## 2024-02-09 ENCOUNTER — Ambulatory Visit: Payer: Self-pay | Admitting: Internal Medicine

## 2024-02-13 LAB — ZNT8 ANTIBODIES: ZNT8 Antibodies: <10 U/mL (ref <15)

## 2024-02-13 LAB — GLUCOSE, FASTING: Glucose, Bld: 123 mg/dL (ref 65–139)

## 2024-02-13 LAB — C-PEPTIDE: C-Peptide: 6.14 ng/mL — ABNORMAL HIGH (ref 0.80–3.85)

## 2024-02-13 LAB — IA-2 ANTIBODY: IA-2 Antibody: <5.4 U/mL (ref <5.4)

## 2024-02-13 LAB — GLUTAMIC ACID DECARBOXYLASE AUTO ABS: Glutamic Acid Decarb Ab: <5 [IU]/mL (ref <5)

## 2024-02-14 ENCOUNTER — Encounter (HOSPITAL_BASED_OUTPATIENT_CLINIC_OR_DEPARTMENT_OTHER): Admitting: General Surgery

## 2024-02-14 DIAGNOSIS — L97512 Non-pressure chronic ulcer of other part of right foot with fat layer exposed: Secondary | ICD-10-CM | POA: Diagnosis not present

## 2024-02-14 DIAGNOSIS — E11621 Type 2 diabetes mellitus with foot ulcer: Secondary | ICD-10-CM | POA: Diagnosis not present

## 2024-02-21 ENCOUNTER — Encounter (HOSPITAL_BASED_OUTPATIENT_CLINIC_OR_DEPARTMENT_OTHER): Admitting: General Surgery

## 2024-02-21 DIAGNOSIS — E11621 Type 2 diabetes mellitus with foot ulcer: Secondary | ICD-10-CM | POA: Diagnosis not present

## 2024-02-28 ENCOUNTER — Encounter (HOSPITAL_BASED_OUTPATIENT_CLINIC_OR_DEPARTMENT_OTHER): Admitting: General Surgery

## 2024-02-28 DIAGNOSIS — E11621 Type 2 diabetes mellitus with foot ulcer: Secondary | ICD-10-CM | POA: Diagnosis not present

## 2024-03-01 ENCOUNTER — Other Ambulatory Visit: Payer: Self-pay

## 2024-03-01 ENCOUNTER — Other Ambulatory Visit: Payer: Self-pay | Admitting: Internal Medicine

## 2024-03-04 DIAGNOSIS — E0842 Diabetes mellitus due to underlying condition with diabetic polyneuropathy: Secondary | ICD-10-CM

## 2024-03-06 ENCOUNTER — Encounter (HOSPITAL_BASED_OUTPATIENT_CLINIC_OR_DEPARTMENT_OTHER): Attending: General Surgery | Admitting: General Surgery

## 2024-03-06 DIAGNOSIS — E11621 Type 2 diabetes mellitus with foot ulcer: Secondary | ICD-10-CM | POA: Insufficient documentation

## 2024-03-06 DIAGNOSIS — E1151 Type 2 diabetes mellitus with diabetic peripheral angiopathy without gangrene: Secondary | ICD-10-CM | POA: Diagnosis not present

## 2024-03-06 DIAGNOSIS — L97512 Non-pressure chronic ulcer of other part of right foot with fat layer exposed: Secondary | ICD-10-CM | POA: Insufficient documentation

## 2024-03-12 ENCOUNTER — Other Ambulatory Visit: Payer: Self-pay | Admitting: Internal Medicine

## 2024-03-13 ENCOUNTER — Encounter (HOSPITAL_BASED_OUTPATIENT_CLINIC_OR_DEPARTMENT_OTHER): Admitting: General Surgery

## 2024-03-13 DIAGNOSIS — E11621 Type 2 diabetes mellitus with foot ulcer: Secondary | ICD-10-CM | POA: Diagnosis not present

## 2024-03-20 ENCOUNTER — Encounter (HOSPITAL_BASED_OUTPATIENT_CLINIC_OR_DEPARTMENT_OTHER): Admitting: General Surgery

## 2024-03-20 DIAGNOSIS — E11621 Type 2 diabetes mellitus with foot ulcer: Secondary | ICD-10-CM | POA: Diagnosis not present

## 2024-03-27 ENCOUNTER — Encounter (HOSPITAL_BASED_OUTPATIENT_CLINIC_OR_DEPARTMENT_OTHER): Admitting: General Surgery

## 2024-03-27 DIAGNOSIS — E11621 Type 2 diabetes mellitus with foot ulcer: Secondary | ICD-10-CM | POA: Diagnosis not present

## 2024-04-03 ENCOUNTER — Encounter (HOSPITAL_BASED_OUTPATIENT_CLINIC_OR_DEPARTMENT_OTHER): Admitting: General Surgery

## 2024-04-10 ENCOUNTER — Encounter (HOSPITAL_BASED_OUTPATIENT_CLINIC_OR_DEPARTMENT_OTHER): Admitting: General Surgery

## 2024-04-17 ENCOUNTER — Encounter (HOSPITAL_BASED_OUTPATIENT_CLINIC_OR_DEPARTMENT_OTHER): Admitting: General Surgery

## 2024-04-24 ENCOUNTER — Encounter (HOSPITAL_BASED_OUTPATIENT_CLINIC_OR_DEPARTMENT_OTHER): Admitting: General Surgery

## 2024-05-16 ENCOUNTER — Ambulatory Visit: Admitting: Internal Medicine

## 2024-06-13 ENCOUNTER — Ambulatory Visit: Admitting: Internal Medicine

## 2024-06-17 ENCOUNTER — Ambulatory Visit
